# Patient Record
Sex: Female | Born: 1950 | Race: White | Hispanic: No | Marital: Married | Smoking: Former smoker
Health system: Southern US, Community
[De-identification: ages and names within clinical notes are randomized; demographics above are authoritative.]

## PROBLEM LIST (undated history)

## (undated) ENCOUNTER — Emergency Department (HOSPITAL_COMMUNITY): Payer: PPO | Source: Home / Self Care

## (undated) DIAGNOSIS — R51 Headache: Secondary | ICD-10-CM

## (undated) DIAGNOSIS — R569 Unspecified convulsions: Secondary | ICD-10-CM

## (undated) DIAGNOSIS — I519 Heart disease, unspecified: Secondary | ICD-10-CM

## (undated) DIAGNOSIS — Z923 Personal history of irradiation: Secondary | ICD-10-CM

## (undated) DIAGNOSIS — I10 Essential (primary) hypertension: Secondary | ICD-10-CM

## (undated) DIAGNOSIS — I639 Cerebral infarction, unspecified: Secondary | ICD-10-CM

## (undated) DIAGNOSIS — D329 Benign neoplasm of meninges, unspecified: Secondary | ICD-10-CM

## (undated) DIAGNOSIS — I219 Acute myocardial infarction, unspecified: Secondary | ICD-10-CM

## (undated) DIAGNOSIS — C50512 Malignant neoplasm of lower-outer quadrant of left female breast: Principal | ICD-10-CM

## (undated) DIAGNOSIS — F419 Anxiety disorder, unspecified: Secondary | ICD-10-CM

## (undated) DIAGNOSIS — M1990 Unspecified osteoarthritis, unspecified site: Secondary | ICD-10-CM

## (undated) DIAGNOSIS — G8310 Monoplegia of lower limb affecting unspecified side: Secondary | ICD-10-CM

## (undated) DIAGNOSIS — E785 Hyperlipidemia, unspecified: Secondary | ICD-10-CM

## (undated) DIAGNOSIS — IMO0002 Reserved for concepts with insufficient information to code with codable children: Secondary | ICD-10-CM

## (undated) HISTORY — DX: Malignant neoplasm of lower-outer quadrant of left female breast: C50.512

## (undated) HISTORY — DX: Heart disease, unspecified: I51.9

## (undated) HISTORY — DX: Anxiety disorder, unspecified: F41.9

## (undated) HISTORY — DX: Unspecified convulsions: R56.9

---

## 1985-08-17 DIAGNOSIS — I219 Acute myocardial infarction, unspecified: Secondary | ICD-10-CM

## 1985-08-17 HISTORY — DX: Acute myocardial infarction, unspecified: I21.9

## 1985-08-17 HISTORY — PX: CORONARY ANGIOPLASTY: SHX604

## 1985-08-17 HISTORY — PX: CARDIAC CATHETERIZATION: SHX172

## 1997-05-17 HISTORY — PX: BRAIN MENINGIOMA EXCISION: SHX576

## 1997-08-17 DIAGNOSIS — G8310 Monoplegia of lower limb affecting unspecified side: Secondary | ICD-10-CM

## 1997-08-17 DIAGNOSIS — D329 Benign neoplasm of meninges, unspecified: Secondary | ICD-10-CM

## 1997-08-17 HISTORY — DX: Monoplegia of lower limb affecting unspecified side: G83.10

## 1997-08-17 HISTORY — DX: Benign neoplasm of meninges, unspecified: D32.9

## 1997-11-15 ENCOUNTER — Encounter: Admission: RE | Admit: 1997-11-15 | Discharge: 1998-02-13 | Payer: Self-pay | Admitting: Radiation Oncology

## 1997-11-26 ENCOUNTER — Ambulatory Visit (HOSPITAL_COMMUNITY): Admission: RE | Admit: 1997-11-26 | Discharge: 1997-11-26 | Payer: Self-pay | Admitting: Neurosurgery

## 1999-04-21 ENCOUNTER — Ambulatory Visit (HOSPITAL_COMMUNITY): Admission: RE | Admit: 1999-04-21 | Discharge: 1999-04-21 | Payer: Self-pay | Admitting: Neurosurgery

## 1999-04-21 ENCOUNTER — Encounter: Payer: Self-pay | Admitting: Neurosurgery

## 2000-04-14 ENCOUNTER — Encounter: Payer: Self-pay | Admitting: Neurosurgery

## 2000-04-14 ENCOUNTER — Ambulatory Visit (HOSPITAL_COMMUNITY): Admission: RE | Admit: 2000-04-14 | Discharge: 2000-04-14 | Payer: Self-pay | Admitting: Neurosurgery

## 2000-08-26 ENCOUNTER — Other Ambulatory Visit: Admission: RE | Admit: 2000-08-26 | Discharge: 2000-08-26 | Payer: Self-pay | Admitting: Gynecology

## 2000-12-09 ENCOUNTER — Other Ambulatory Visit: Admission: RE | Admit: 2000-12-09 | Discharge: 2000-12-09 | Payer: Self-pay | Admitting: Gynecology

## 2002-04-10 ENCOUNTER — Encounter: Payer: Self-pay | Admitting: Neurosurgery

## 2002-04-10 ENCOUNTER — Ambulatory Visit (HOSPITAL_COMMUNITY): Admission: RE | Admit: 2002-04-10 | Discharge: 2002-04-10 | Payer: Self-pay | Admitting: Neurosurgery

## 2004-05-13 ENCOUNTER — Other Ambulatory Visit: Admission: RE | Admit: 2004-05-13 | Discharge: 2004-05-13 | Payer: Self-pay | Admitting: Gynecology

## 2004-07-03 ENCOUNTER — Ambulatory Visit: Payer: Self-pay | Admitting: Family Medicine

## 2005-04-24 ENCOUNTER — Ambulatory Visit (HOSPITAL_COMMUNITY): Admission: RE | Admit: 2005-04-24 | Discharge: 2005-04-24 | Payer: Self-pay | Admitting: Neurosurgery

## 2005-06-22 ENCOUNTER — Encounter: Admission: RE | Admit: 2005-06-22 | Discharge: 2005-09-20 | Payer: Self-pay | Admitting: Neurosurgery

## 2005-07-06 ENCOUNTER — Ambulatory Visit: Payer: Self-pay | Admitting: Family Medicine

## 2005-10-01 ENCOUNTER — Ambulatory Visit: Payer: Self-pay | Admitting: Family Medicine

## 2005-10-22 ENCOUNTER — Ambulatory Visit: Payer: Self-pay | Admitting: Family Medicine

## 2005-11-24 ENCOUNTER — Ambulatory Visit: Payer: Self-pay | Admitting: Family Medicine

## 2006-02-19 ENCOUNTER — Ambulatory Visit: Payer: Self-pay | Admitting: Family Medicine

## 2006-03-01 ENCOUNTER — Encounter: Payer: Self-pay | Admitting: Family Medicine

## 2006-03-01 ENCOUNTER — Other Ambulatory Visit: Admission: RE | Admit: 2006-03-01 | Discharge: 2006-03-01 | Payer: Self-pay | Admitting: Family Medicine

## 2006-03-01 ENCOUNTER — Ambulatory Visit: Payer: Self-pay | Admitting: Family Medicine

## 2006-03-24 ENCOUNTER — Ambulatory Visit (HOSPITAL_COMMUNITY): Admission: RE | Admit: 2006-03-24 | Discharge: 2006-03-24 | Payer: Self-pay | Admitting: Neurosurgery

## 2007-02-24 ENCOUNTER — Ambulatory Visit: Payer: Self-pay | Admitting: Family Medicine

## 2007-02-24 LAB — CONVERTED CEMR LAB
Albumin: 4.5 g/dL (ref 3.5–5.2)
Alkaline Phosphatase: 74 units/L (ref 39–117)
BUN: 7 mg/dL (ref 6–23)
Basophils Absolute: 0 10*3/uL (ref 0.0–0.1)
Cholesterol: 240 mg/dL (ref 0–200)
Direct LDL: 137.5 mg/dL
Eosinophils Absolute: 0.2 10*3/uL (ref 0.0–0.6)
GFR calc Af Amer: 133 mL/min
HCT: 43.2 % (ref 36.0–46.0)
Hemoglobin: 14.9 g/dL (ref 12.0–15.0)
Lymphocytes Relative: 40.3 % (ref 12.0–46.0)
MCHC: 34.4 g/dL (ref 30.0–36.0)
MCV: 91.3 fL (ref 78.0–100.0)
Monocytes Absolute: 0.5 10*3/uL (ref 0.2–0.7)
Monocytes Relative: 9.7 % (ref 3.0–11.0)
Neutro Abs: 2.5 10*3/uL (ref 1.4–7.7)
Neutrophils Relative %: 46.6 % (ref 43.0–77.0)
Potassium: 3.2 meq/L — ABNORMAL LOW (ref 3.5–5.1)
Sodium: 135 meq/L (ref 135–145)
TSH: 1.35 microintl units/mL (ref 0.35–5.50)
Total Bilirubin: 0.7 mg/dL (ref 0.3–1.2)
Total CHOL/HDL Ratio: 2.9

## 2007-03-04 ENCOUNTER — Other Ambulatory Visit: Admission: RE | Admit: 2007-03-04 | Discharge: 2007-03-04 | Payer: Self-pay | Admitting: Family Medicine

## 2007-03-04 ENCOUNTER — Encounter: Payer: Self-pay | Admitting: Family Medicine

## 2007-03-04 ENCOUNTER — Ambulatory Visit: Payer: Self-pay | Admitting: Family Medicine

## 2007-05-12 DIAGNOSIS — I4949 Other premature depolarization: Secondary | ICD-10-CM | POA: Insufficient documentation

## 2007-05-12 DIAGNOSIS — E785 Hyperlipidemia, unspecified: Secondary | ICD-10-CM | POA: Insufficient documentation

## 2007-05-12 DIAGNOSIS — I10 Essential (primary) hypertension: Secondary | ICD-10-CM | POA: Insufficient documentation

## 2007-05-20 DIAGNOSIS — S8490XA Injury of unspecified nerve at lower leg level, unspecified leg, initial encounter: Secondary | ICD-10-CM | POA: Insufficient documentation

## 2007-07-27 ENCOUNTER — Encounter: Payer: Self-pay | Admitting: Family Medicine

## 2007-08-24 ENCOUNTER — Encounter: Payer: Self-pay | Admitting: Family Medicine

## 2008-01-19 ENCOUNTER — Telehealth: Payer: Self-pay | Admitting: Family Medicine

## 2008-01-20 ENCOUNTER — Telehealth: Payer: Self-pay | Admitting: *Deleted

## 2008-01-20 ENCOUNTER — Encounter: Payer: Self-pay | Admitting: Family Medicine

## 2008-01-31 ENCOUNTER — Telehealth: Payer: Self-pay | Admitting: Family Medicine

## 2008-02-03 ENCOUNTER — Telehealth: Payer: Self-pay | Admitting: Family Medicine

## 2008-02-08 ENCOUNTER — Telehealth (INDEPENDENT_AMBULATORY_CARE_PROVIDER_SITE_OTHER): Payer: Self-pay | Admitting: *Deleted

## 2008-02-09 ENCOUNTER — Telehealth: Payer: Self-pay | Admitting: Family Medicine

## 2008-02-10 ENCOUNTER — Telehealth: Payer: Self-pay | Admitting: Family Medicine

## 2008-02-13 ENCOUNTER — Telehealth (INDEPENDENT_AMBULATORY_CARE_PROVIDER_SITE_OTHER): Payer: Self-pay | Admitting: *Deleted

## 2008-02-21 ENCOUNTER — Encounter: Payer: Self-pay | Admitting: Family Medicine

## 2008-03-07 ENCOUNTER — Telehealth: Payer: Self-pay | Admitting: *Deleted

## 2008-03-16 ENCOUNTER — Ambulatory Visit: Payer: Self-pay | Admitting: Family Medicine

## 2008-03-16 LAB — CONVERTED CEMR LAB
AST: 24 units/L (ref 0–37)
Albumin: 4.3 g/dL (ref 3.5–5.2)
BUN: 7 mg/dL (ref 6–23)
Basophils Absolute: 0 10*3/uL (ref 0.0–0.1)
Basophils Relative: 0.2 % (ref 0.0–3.0)
Blood in Urine, dipstick: NEGATIVE
Calcium: 9.4 mg/dL (ref 8.4–10.5)
Carbamazepine Lvl: 5.9 ug/mL (ref 4.0–12.0)
Creatinine, Ser: 0.7 mg/dL (ref 0.4–1.2)
Direct LDL: 136.5 mg/dL
Eosinophils Absolute: 0.2 10*3/uL (ref 0.0–0.7)
Eosinophils Relative: 3.1 % (ref 0.0–5.0)
GFR calc Af Amer: 111 mL/min
GFR calc non Af Amer: 92 mL/min
Glucose, Urine, Semiquant: NEGATIVE
HCT: 39.9 % (ref 36.0–46.0)
HDL: 61.2 mg/dL (ref >39.0)
Ketones, urine, test strip: NEGATIVE
MCHC: 34.7 g/dL (ref 30.0–36.0)
MCV: 92.6 fL (ref 78.0–100.0)
Monocytes Absolute: 0.6 10*3/uL (ref 0.1–1.0)
Nitrite: NEGATIVE
RBC: 4.31 M/uL (ref 3.87–5.11)
Specific Gravity, Urine: 1.02
TSH: 2.15 microintl units/mL (ref 0.35–5.50)
Total Bilirubin: 0.8 mg/dL (ref 0.3–1.2)
Triglycerides: 131 mg/dL (ref 0–149)
WBC: 5.6 10*3/uL (ref 4.5–10.5)
pH: 7

## 2008-03-20 ENCOUNTER — Encounter
Admission: RE | Admit: 2008-03-20 | Discharge: 2008-04-18 | Payer: Self-pay | Admitting: Physical Medicine & Rehabilitation

## 2008-03-21 ENCOUNTER — Ambulatory Visit: Payer: Self-pay | Admitting: Physical Medicine & Rehabilitation

## 2008-03-23 ENCOUNTER — Encounter: Payer: Self-pay | Admitting: Family Medicine

## 2008-03-23 ENCOUNTER — Other Ambulatory Visit: Admission: RE | Admit: 2008-03-23 | Discharge: 2008-03-23 | Payer: Self-pay | Admitting: Family Medicine

## 2008-03-23 ENCOUNTER — Ambulatory Visit: Payer: Self-pay | Admitting: Family Medicine

## 2008-04-11 ENCOUNTER — Encounter
Admission: RE | Admit: 2008-04-11 | Discharge: 2008-07-10 | Payer: Self-pay | Admitting: Physical Medicine & Rehabilitation

## 2008-04-18 ENCOUNTER — Ambulatory Visit: Payer: Self-pay | Admitting: Physical Medicine & Rehabilitation

## 2008-04-27 ENCOUNTER — Encounter
Admission: RE | Admit: 2008-04-27 | Discharge: 2008-04-27 | Payer: Self-pay | Admitting: Physical Medicine & Rehabilitation

## 2008-05-17 ENCOUNTER — Encounter
Admission: RE | Admit: 2008-05-17 | Discharge: 2008-07-26 | Payer: Self-pay | Admitting: Physical Medicine & Rehabilitation

## 2008-05-18 ENCOUNTER — Ambulatory Visit: Payer: Self-pay | Admitting: Physical Medicine & Rehabilitation

## 2008-05-24 ENCOUNTER — Ambulatory Visit: Payer: Self-pay | Admitting: Family Medicine

## 2008-07-17 ENCOUNTER — Encounter
Admission: RE | Admit: 2008-07-17 | Discharge: 2008-07-26 | Payer: Self-pay | Admitting: Physical Medicine & Rehabilitation

## 2008-09-19 ENCOUNTER — Emergency Department (HOSPITAL_COMMUNITY): Admission: EM | Admit: 2008-09-19 | Discharge: 2008-09-19 | Payer: Self-pay | Admitting: Emergency Medicine

## 2008-10-08 ENCOUNTER — Encounter: Payer: Self-pay | Admitting: Family Medicine

## 2008-12-04 ENCOUNTER — Telehealth: Payer: Self-pay | Admitting: Family Medicine

## 2009-01-02 ENCOUNTER — Telehealth: Payer: Self-pay | Admitting: Family Medicine

## 2009-03-21 ENCOUNTER — Ambulatory Visit: Payer: Self-pay | Admitting: Family Medicine

## 2009-03-21 LAB — CONVERTED CEMR LAB
ALT: 20 units/L (ref 0–35)
BUN: 9 mg/dL (ref 6–23)
Basophils Relative: 0.4 % (ref 0.0–3.0)
CO2: 27 meq/L (ref 19–32)
Chloride: 95 meq/L — ABNORMAL LOW (ref 96–112)
Direct LDL: 132.2 mg/dL
Eosinophils Relative: 1.7 % (ref 0.0–5.0)
Glucose, Urine, Semiquant: NEGATIVE
HCT: 39.5 % (ref 36.0–46.0)
Lymphs Abs: 2.6 10*3/uL (ref 0.7–4.0)
MCV: 89.7 fL (ref 78.0–100.0)
Monocytes Absolute: 0.6 10*3/uL (ref 0.1–1.0)
Platelets: 261 10*3/uL (ref 150.0–400.0)
Potassium: 3.4 meq/L — ABNORMAL LOW (ref 3.5–5.1)
Protein, U semiquant: NEGATIVE
RBC: 4.41 M/uL (ref 3.87–5.11)
TSH: 1.64 microintl units/mL (ref 0.35–5.50)
Total Protein: 7.3 g/dL (ref 6.0–8.3)
VLDL: 25.8 mg/dL (ref 0.0–40.0)
WBC: 6.3 10*3/uL (ref 4.5–10.5)
pH: 7.5

## 2009-03-28 ENCOUNTER — Other Ambulatory Visit: Admission: RE | Admit: 2009-03-28 | Discharge: 2009-03-28 | Payer: Self-pay | Admitting: Family Medicine

## 2009-03-28 ENCOUNTER — Encounter: Payer: Self-pay | Admitting: Family Medicine

## 2009-03-28 ENCOUNTER — Ambulatory Visit: Payer: Self-pay | Admitting: Family Medicine

## 2009-05-22 ENCOUNTER — Ambulatory Visit: Payer: Self-pay | Admitting: Family Medicine

## 2009-10-22 ENCOUNTER — Encounter: Payer: Self-pay | Admitting: *Deleted

## 2009-12-16 ENCOUNTER — Telehealth: Payer: Self-pay | Admitting: Family Medicine

## 2010-03-24 ENCOUNTER — Ambulatory Visit: Payer: Self-pay | Admitting: Family Medicine

## 2010-03-24 LAB — CONVERTED CEMR LAB
ALT: 27 units/L (ref 0–35)
AST: 23 units/L (ref 0–37)
Albumin: 4.7 g/dL (ref 3.5–5.2)
Basophils Absolute: 0 10*3/uL (ref 0.0–0.1)
Bilirubin Urine: NEGATIVE
CO2: 29 meq/L (ref 19–32)
GFR calc non Af Amer: 122.86 mL/min (ref >60)
Glucose, Bld: 82 mg/dL (ref 70–99)
HCT: 42.2 % (ref 36.0–46.0)
Hemoglobin: 14.5 g/dL (ref 12.0–15.0)
Lymphs Abs: 2.7 10*3/uL (ref 0.7–4.0)
Monocytes Relative: 8.9 % (ref 3.0–12.0)
Neutro Abs: 3.4 10*3/uL (ref 1.4–7.7)
Potassium: 3.6 meq/L (ref 3.5–5.1)
RDW: 13.4 % (ref 11.5–14.6)
Sodium: 133 meq/L — ABNORMAL LOW (ref 135–145)
TSH: 1.73 microintl units/mL (ref 0.35–5.50)
Total Protein: 7.1 g/dL (ref 6.0–8.3)
Urobilinogen, UA: 0.2
pH: 8.5

## 2010-03-31 ENCOUNTER — Ambulatory Visit: Payer: Self-pay | Admitting: Family Medicine

## 2010-03-31 ENCOUNTER — Other Ambulatory Visit: Admission: RE | Admit: 2010-03-31 | Discharge: 2010-03-31 | Payer: Self-pay | Admitting: Family Medicine

## 2010-03-31 DIAGNOSIS — D332 Benign neoplasm of brain, unspecified: Secondary | ICD-10-CM | POA: Insufficient documentation

## 2010-04-14 ENCOUNTER — Telehealth: Payer: Self-pay | Admitting: Family Medicine

## 2010-06-09 ENCOUNTER — Ambulatory Visit: Payer: Self-pay | Admitting: Family Medicine

## 2010-09-07 ENCOUNTER — Encounter: Payer: Self-pay | Admitting: Neurosurgery

## 2010-09-14 LAB — CONVERTED CEMR LAB: Pap Smear: NEGATIVE

## 2010-09-16 NOTE — Progress Notes (Signed)
Summary: refill  Phone Note Refill Request Message from:  Fax from Pharmacy on April 14, 2010 5:11 PM  Refills Requested: Medication #1:  ACCUPRIL 40 MG  TABS 2 every morning Initial call taken by: Kern Reap CMA Duncan Dull),  April 14, 2010 5:16 PM    Prescriptions: ACCUPRIL 40 MG  TABS (QUINAPRIL HCL) 2 every morning  #200 x 3   Entered by:   Kern Reap CMA (AAMA)   Authorized by:   Roderick Pee MD   Signed by:   Kern Reap CMA (AAMA) on 04/14/2010   Method used:   Electronically to        [facility] [address] (retail)       [address]       [place], [place]  [address]       Ph: [phone]       Fax: [phone]   RxID:   [id number]

## 2010-09-16 NOTE — Miscellaneous (Signed)
Summary: mammogram  Clinical Lists Changes  Observations: Added new observation of MAMMOGRAM: normal (10/18/2009 13:44)      Preventive Care Screening  Mammogram:    Date:  10/18/2009    Results:  normal

## 2010-09-16 NOTE — Assessment & Plan Note (Signed)
Summary: flu shot pt will be here around 145p/njr  Nurse Visit   Review of Systems       Flu Vaccine Consent Questions     Do you have a history of severe allergic reactions to this vaccine? no    Any prior history of allergic reactions to egg and/or gelatin? no    Do you have a sensitivity to the preservative Thimersol? no    Do you have a past history of Guillan-Barre Syndrome? no    Do you currently have an acute febrile illness? no    Have you ever had a severe reaction to latex? no    Vaccine information given and explained to patient? yes    Are you currently pregnant? no    Lot Number:[id number]   Exp Date:02/14/2011   Site Given  Left Deltoid IM    Allergies: No Known Drug Allergies  Orders Added: 1)  Admin 1st Vaccine [90471] 2)  Flu Vaccine 51yrs + [[address]]

## 2010-09-16 NOTE — Assessment & Plan Note (Signed)
Summary: CPX // RS   Vital Signs:  Patient profile:   60 year old female Menstrual status:  postmenopausal Height:      63 inches Weight:      140 pounds BMI:     24.89 BP sitting:   170 / 110  (left arm) Cuff size:   regular  Vitals Entered By: Kern Reap CMA Duncan Dull) (March 31, 2010 2:49 PM) CC: cpx   CC:  cpx.  History of Present Illness: Stacy Diaz is a 60 year old, married female, status post CNS tumor removal, and has almost 100%. Right and left lower extremity paralysis, who comes in today for annual physical exam. This was reviewed in detail, and there are no changes   She continues to be nonambulatory.  She is able to get around with electric scooter.  She has braces on both lower extremities to prevent contractures.  She states although she is unhappy being confined to wheelchair other than that her life is good and she is happy to be alive  Allergies: No Known Drug Allergies  Past History:  Past medical, surgical, family and social histories (including risk factors) reviewed, and no changes noted (except as noted below).  Past Medical History: Reviewed history from 05/20/2007 and no changes required. 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 meningioma  1998 MI  Family History: Reviewed history and no changes required.  Social History: Reviewed history from 03/23/2008 and no changes required. Retired Married Never Smoked Alcohol use-no Drug use-no Regular exercise-no  Review of Systems      See HPI  Physical Exam  General:  Well-developed,well-nourished,in no acute distress; alert,appropriate and cooperative throughout examination Head:  Normocephalic and atraumatic without obvious abnormalities. No apparent alopecia or balding. Eyes:  No corneal or conjunctival inflammation noted. EOMI. Perrla. Funduscopic exam benign,  without hemorrhages, exudates or papilledema. Vision grossly normal. Ears:  External ear exam shows no significant lesions or deformities.  Otoscopic examination reveals clear canals, tympanic membranes are intact bilaterally without bulging, retraction, inflammation or discharge. Hearing is grossly normal bilaterally. Nose:  External nasal examination shows no deformity or inflammation. Nasal mucosa are pink and moist without lesions or exudates. Mouth:  Oral mucosa and oropharynx without lesions or exudates.  Teeth in good repair. Neck:  No deformities, masses, or tenderness noted. Chest Wall:  No deformities, masses, or tenderness noted. Breasts:  No mass, nodules, thickening, tenderness, bulging, retraction, inflamation, nipple discharge or skin changes noted.   Lungs:  Normal respiratory effort, chest expands symmetrically. Lungs are clear to auscultation, no crackles or wheezes. Heart:  Normal rate and regular rhythm. S1 and S2 normal without gallop, murmur, click, rub or other extra sounds. Abdomen:  Bowel sounds positive,abdomen soft and non-tender without masses, organomegaly or hernias noted. Genitalia:  Pelvic Exam:        External: normal female genitalia without lesions or masses        Vagina: normal without lesions or masses        Cervix: normal without lesions or masses        Adnexa: normal bimanual exam without masses or fullness        Uterus: normal by palpation        Pap smear: performed Msk:  braces on both lower extremities to prevent extension and contractures, total paralysis, and Pulses:  R and L carotid,radial,femoral,dorsalis pedis and posterior tibial pulses are full and  equal bilaterally Neurologic:  normal except for paralysis below the waist Skin:  Intact without suspicious lesions or rashes Cervical Nodes:  No lymphadenopathy noted Axillary Nodes:  No palpable lymphadenopathy Inguinal Nodes:  No significant adenopathy Psych:  Cognition and judgment appear  intact. Alert and cooperative with normal attention span and concentration. No apparent delusions, illusions, hallucinations   Problems:  Medical Problems Added: 1)  Dx of Benign Neoplasm of Brain  (ICD-225.0) 2)  Dx of Routine General Medical Exam@health  Care Facl  (ICD-V70.0)  Impression & Recommendations:  Problem # 1:  ROUTINE GENERAL MEDICAL EXAM@HEALTH  CARE FACL (ICD-V70.0) Assessment Unchanged  Orders: Prescription Created Electronically [phone]) EKG w/ Interpretation ([address])  Problem # 2:  HYPERTENSION (ICD-401.9) Assessment: Improved  Her updated medication list for this problem includes:    Accupril 40 Mg Tabs (Quinapril hcl) .Marland Kitchen... 2 every morning    Tenoretic 50 50-25 Mg Tabs (Atenolol-chlorthalidone) .Marland Kitchen... Take 1 tablet by mouth every morning  Orders: Prescription Created Electronically [phone]) EKG w/ Interpretation ([address])  Problem # 3:  HYPERLIPIDEMIA (ICD-272.4) Assessment: Deteriorated  Her updated medication list for this problem includes:    Zocor 20 Mg Tabs (Simvastatin) .Marland Kitchen... 1 tab @ bedtime  Orders: Prescription Created Electronically [id number]) EKG w/ Interpretation ([address])  Problem # 4:  BENIGN NEOPLASM OF BRAIN (ICD-225.0) Assessment: Unchanged  Complete Medication List: 1)  Tegretol 200 Mg Tabs (Carbamazepine) .... Take 1 tablet by mouth three times a day 2)  Zocor 20 Mg Tabs (Simvastatin) .Marland Kitchen.. 1 tab @ bedtime 3)  Ibuprofen 200 Mg Caps (Ibuprofen) .... 3 two times a day 4)  Accupril 40 Mg Tabs (Quinapril hcl) .... 2 every morning 5)  Aspirin 325 Mg Tbec (Aspirin) .... Once daily 6)  Vicodin Es 7.5-750 Mg Tabs (Hydrocodone-acetaminophen) .... Take 1 tablet by mouth every 4 hrs as needed for pain 7)  Tenoretic 50 50-25 Mg Tabs (Atenolol-chlorthalidone) .... Take 1 tablet by mouth every morning 8)  Klor-con M20 20 Meq Cr-tabs (Potassium chloride crys cr) .... Take 1 tablet by mouth every morning 9)  Baclofen 20 Mg Tabs (Baclofen) .... Take one tab  four times a day  Patient Instructions: 1)  continue your current medications except increase the Zocor to 20 mg nightly. 2)  Please schedule a follow-up appointment in 1 year. Prescriptions: VICODIN ES 7.5-750 MG  TABS (HYDROCODONE-ACETAMINOPHEN) Take 1 tablet by mouth every 4 hrs as needed for pain  #200 x 6   Entered and Authorized by:   Roderick Pee MD   Signed by:   Roderick Pee MD on 03/31/2010   Method used:   Print then Give to Patient   RxID:   [id number] BACLOFEN 20 MG TABS (BACLOFEN) take one tab four times a day  #400 x 3   Entered and Authorized by:   Roderick Pee MD   Signed by:   Roderick Pee MD on 03/31/2010   Method used:   Electronically to        [facility] [address] (retail)       [address]       [place], [place]  [address]       Ph: [phone]       Fax: [phone]   RxID:   [id number] KLOR-CON M20 20 MEQ  CR-TABS (POTASSIUM CHLORIDE CRYS CR) Take 1 tablet by mouth every morning  #100 x 3   Entered and Authorized by:  Roderick Pee MD   Signed by:   Roderick Pee MD on 03/31/2010   Method used:   Electronically to        [facility] [address] (retail)       [address]       [place], [place]  [address]       Ph: [phone]       Fax: [phone]   RxID:   [id number] TENORETIC 50 50-25 MG  TABS (ATENOLOL-CHLORTHALIDONE) Take 1 tablet by mouth every morning  #100 x 3   Entered and Authorized by:   Roderick Pee MD   Signed by:   Roderick Pee MD on 03/31/2010   Method used:   Electronically to        [facility] (retail)       [address]       [place], [place]  [address]       Ph: [phone]       Fax: [phone]   RxID:   [id number] ACCUPRIL 40 MG  TABS (QUINAPRIL HCL) 2 every morning  #200 x 3   Entered and Authorized by:   Roderick Pee MD   Signed by:   Roderick Pee MD on  03/31/2010   Method used:   Electronically to        [facility] (retail)       [address]       [place], [place]  [address]       Ph: [phone]       Fax: [phone]   RxID:   [id number] TEGRETOL 200 MG  TABS (CARBAMAZEPINE) Take 1 tablet by mouth three times a day  #300 x 3   Entered and Authorized by:   Roderick Pee MD   Signed by:   Roderick Pee MD on 03/31/2010   Method used:   Electronically to        [facility] (retail)       [address]       [place], [place]  [address]       Ph: [phone]       Fax: [phone]   RxID:   [id number] ZOCOR 20 MG  TABS (SIMVASTATIN) 1 tab @ bedtime  #100 x 3   Entered and Authorized by:   Roderick Pee MD   Signed by:   Roderick Pee MD on 03/31/2010   Method used:   Electronically to        [facility] (retail)       [address]       [place], [place]  [address]       Ph: [phone]       Fax: [phone]   RxID:   [id number]

## 2010-09-16 NOTE — Progress Notes (Signed)
Summary: hydrocodone refill  Phone Note From Pharmacy   Summary of Call: patient is requesting a refill of hydrocodone is this okay to fill? Initial call taken by: Kern Reap CMA Duncan Dull),  Dec 16, 2009 11:43 AM  Follow-up for Phone Call        okay x 6 months Follow-up by: Roderick Pee MD,  Dec 16, 2009 11:53 AM  Additional Follow-up for Phone Call Additional follow up Details #1::        faxed to pharmacy Additional Follow-up by: Kern Reap CMA Duncan Dull),  Dec 16, 2009 3:51 PM

## 2010-12-30 NOTE — Assessment & Plan Note (Signed)
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  We got  to start her on physical therapy, which really just began over the last  few days.  They are working on transfers, tone control, etc.  We will  also have Orthotics come on to see her regarding bracing adjustment.  She does not know there is a huge difference with the Baclofen at this  point.  She is still having a lot of fluctuating spasms.  She felt that  trigger point injection helped her a couple of days, but then the  effects wore off.  She notes that the Lidoderm patch has helped at night  for pain and has helped her at rest.  She rates her pain 3-7/10.  Overall, she describes it as dull and it had been aching.  She has pain  in her knees when they hyperextend in standing and transferring.   Pain seems to improve somewhat with rest and some of her analgesics.  She does use Vicodin mainly for pain control.   REVIEW OF SYSTEMS:  Notable for numbness, trouble walking, and night  sweats.  Other pertinent positives are listed above, and full review is  in the written health and history section of the chart.   SOCIAL HISTORY:  The patient is married, living with her husband.  Husband remains very supportive.   PHYSICAL EXAMINATION:  VITAL SIGNS:  Blood pressure is 160/83, pulse is  67, respiratory rate 18, and she is sating 99% on room air.  GENERAL:  The patient is pleasant, alert, and oriented x3.  She is in a  wheelchair today.  The patient has similar tone compared to her last  visit with 1-2/4 tone at the knees and hips and 3/4 tone at the ankles.  She has clonus bilaterally.  Reflexes are hyperactive.  Sensory exam is  really unremarkable for the most part.  She has good shoulder and neck  range of motion.  She has normal motor and sensory findings in the upper  extremities today.  Trapezius areas are tender.  Cognitively, she is  appropriate.  She remains overweight.  HEART:  Regular.  CHEST:  Clear.  ABDOMEN:  Soft and nontender.   ASSESSMENT:  1. History of meningioma.  2. Questionable thoracic spinal cord injury.  I wonder if she had some      type of spinal cord infarct.  3. Trapezius/shoulder girdle myofascial pain.  4. History of myocardial infarction.   PLAN:  1. Continue physical therapy.  We will have Orthotics see her for      brace adjustment.  I think she needs to look at a non-hinged type      of brace for better control of her knee, perhaps with heel      elevation.  2. We will titrate Baclofen up to 20 mg t.i.d. over the next week's      time.  The patient has had no problems thus far with the 10-mg      dosing.  Consider Botox.  3. Wrote prescription for Lidoderm patches for local relief.  She can      use Vicodin for breakthrough pain as well.  4. I will see her back in about a month's time.  I think she has a lot      of room for progress.  We spent an extensive amount of time      reviewing her case, symptoms, and prognosis today.  I would like to  order a CT myelogram of her thoracic and lumbar spine to assess for      stenosis.  Unfortunately, because of the hardware in her brain, she      is not a candidate for an MRI.  Husband and the patient note      overall decline over the last few years in her      motor function.  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  I think this has not been completely followed      up or worked up as of this point, and I would like to do so by      ordering the CT myelogram.      Ranelle Oyster, M.D.  Electronically Signed     ZTS/MedQ  D:  04/18/2008 11:31:25  T:  04/19/2008 04:19:38  Job #:  [id number]   cc:   Tinnie Gens A. Tawanna Cooler, MD  [address] [place]  [place] [address]

## 2010-12-30 NOTE — Assessment & Plan Note (Signed)
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  She  had been involved with therapy.  She has not changed a lot functionally.  Her knees still tend to go into recurvatum when she tries to stand or  bear weight at all.  She ran into a snag as far as coverage of her  potential new orthotics as well, and we will need to go to another  vendor.  She feels that the back when this helps spasticity somewhat,  but husband feels that it may have impaired and negatively impacted her  transverse by decreasing some of her resting tone, which apparently she  relied on somewhat for helping him move her.  Sleep is fair.  She denies  pain except at the knees and when her legs catch.  They tend to go into  various spasms at the knees when transferring, etc.  We reviewed her CT  scan over the phone a few weeks ago and no significant pathology was  identified that could account for myelopathy of the thoracic or lumbar  cord.   REVIEW OF SYSTEMS:  Notable for the above.  Full review is in the  written health and history section in the chart.   SOCIAL HISTORY:  Unchanged.  She is married, living with her husband,  and remains supportive.   PHYSICAL EXAMINATION:  VITAL SIGNS:  Blood pressure is 161/82, pulse is  66, respiratory rate 18, and she is saturating 99% on room air.  Weight  is stable.  GENERAL:  The patient is pleasant, alert, and oriented x3.  HEART:  Regular.  CHEST:  Clear.  ABDOMEN:  Soft and nontender.  MUSCULOSKELETAL:  She continues to have tone which is 1+ to 2 at the  knees; 2/4 at the ankles with clonus noted.  She has active knee  extension at 3/5 and knee flexion is 3 to 3+/5.  She has trace 1 plantar  flexion and no obvious dorsiflexion of either foot.  Sensory findings  are normal in both legs.  Cognitively, she is intact.   ASSESSMENT:  1. History of meningioma.  2. Questionable thoracic spinal cord injury/infarct.  3. Trapezius and shoulder girdle myofascial  pain.  4. History of myocardial infarction.   PLAN:  1. We will continue with physical therapy to work on range of motion      and strengthening.  We will have the patient go to [facility] for knee      braces to prevent recurvatum.  I think we should try this first      before we go into new ankle-foot orthosis.  2. We will increase baclofen to 20 mg q.i.d. for now to see if she can      get benefits again.  I think if she does not make any further      progress with the therapy route, we need look at Botox and trying      to treat her spasticity more aggressively.  I note that she depends      somewhat on the tone to help support her for transferring, but the      flip-side of this is that she does not have consistent active      movements of the limbs and gastrocs are quite tight in particular.  3. I will see the patient back in about a month for followup.      Ranelle Oyster, M.D.  Electronically Signed    ZTS/MedQ  D:  05/18/2008 16:44:16  T:  05/19/2008 04:01:02  Job #:  [id number]

## 2011-01-02 ENCOUNTER — Telehealth: Payer: Self-pay | Admitting: Family Medicine

## 2011-01-15 ENCOUNTER — Other Ambulatory Visit: Payer: Self-pay | Admitting: *Deleted

## 2011-01-15 MED ORDER — HYDROCODONE-ACETAMINOPHEN 7.5-750 MG PO TABS: 1.0000 | ORAL_TABLET | Freq: Four times a day (QID) | ORAL | Status: DC | PRN

## 2011-01-23 ENCOUNTER — Encounter: Payer: Self-pay | Admitting: Family Medicine

## 2011-02-02 ENCOUNTER — Other Ambulatory Visit: Payer: Self-pay | Admitting: Family Medicine

## 2011-02-25 ENCOUNTER — Other Ambulatory Visit: Payer: Self-pay | Admitting: Family Medicine

## 2011-03-10 ENCOUNTER — Other Ambulatory Visit: Payer: Self-pay | Admitting: Family Medicine

## 2011-03-19 ENCOUNTER — Other Ambulatory Visit: Payer: Self-pay | Admitting: *Deleted

## 2011-03-19 MED ORDER — POTASSIUM CHLORIDE ER 10 MEQ PO TBCR: 10.0000 meq | EXTENDED_RELEASE_TABLET | Freq: Three times a day (TID) | ORAL | Status: DC

## 2011-03-26 ENCOUNTER — Other Ambulatory Visit (INDEPENDENT_AMBULATORY_CARE_PROVIDER_SITE_OTHER): Payer: Medicare Other

## 2011-03-26 DIAGNOSIS — T421X5A Adverse effect of iminostilbenes, initial encounter: Secondary | ICD-10-CM

## 2011-03-26 DIAGNOSIS — Z0000 Encounter for general adult medical examination without abnormal findings: Secondary | ICD-10-CM

## 2011-03-26 DIAGNOSIS — T4275XA Adverse effect of unspecified antiepileptic and sedative-hypnotic drugs, initial encounter: Secondary | ICD-10-CM

## 2011-03-26 DIAGNOSIS — Z79899 Other long term (current) drug therapy: Secondary | ICD-10-CM

## 2011-03-26 DIAGNOSIS — E785 Hyperlipidemia, unspecified: Secondary | ICD-10-CM

## 2011-03-26 DIAGNOSIS — I10 Essential (primary) hypertension: Secondary | ICD-10-CM

## 2011-03-26 LAB — CBC WITH DIFFERENTIAL/PLATELET
Basophils Absolute: 0 10*3/uL (ref 0.0–0.1)
Eosinophils Absolute: 0.2 10*3/uL (ref 0.0–0.7)
Lymphocytes Relative: 43.8 % (ref 12.0–46.0)
MCHC: 33.8 g/dL (ref 30.0–36.0)
Monocytes Absolute: 0.7 10*3/uL (ref 0.1–1.0)
Neutrophils Relative %: 40.2 % — ABNORMAL LOW (ref 43.0–77.0)
Platelets: 293 10*3/uL (ref 150.0–400.0)
RDW: 12.8 % (ref 11.5–14.6)

## 2011-03-26 LAB — BASIC METABOLIC PANEL
BUN: 12 mg/dL (ref 6–23)
CO2: 28 mEq/L (ref 19–32)
Calcium: 9.4 mg/dL (ref 8.4–10.5)
Creatinine, Ser: 0.7 mg/dL (ref 0.4–1.2)
Glucose, Bld: 95 mg/dL (ref 70–99)

## 2011-03-26 LAB — POCT URINALYSIS DIPSTICK
Protein, UA: NEGATIVE
Spec Grav, UA: 1.01
Urobilinogen, UA: 0.2
pH, UA: 7

## 2011-03-26 LAB — LIPID PANEL
HDL: 76.3 mg/dL (ref >39.00)
Triglycerides: 108 mg/dL (ref 0.0–149.0)
VLDL: 21.6 mg/dL (ref 0.0–40.0)

## 2011-03-26 LAB — TSH: TSH: 0.93 u[IU]/mL (ref 0.35–5.50)

## 2011-03-26 LAB — HEPATIC FUNCTION PANEL
Alkaline Phosphatase: 60 U/L (ref 39–117)
Bilirubin, Direct: 0 mg/dL (ref 0.0–0.3)

## 2011-03-26 LAB — LDL CHOLESTEROL, DIRECT: Direct LDL: 142.6 mg/dL

## 2011-03-28 LAB — CARBAMAZEPINE, FREE AND TOTAL
Carbamazepine Metabolite -: 1.6 ug/mL (ref 0.2–2.0)
Carbamazepine Metabolite/: 0.7 ug/mL
Carbamazepine, Total: 6.9 ug/mL (ref 4.0–12.0)

## 2011-04-02 ENCOUNTER — Ambulatory Visit (INDEPENDENT_AMBULATORY_CARE_PROVIDER_SITE_OTHER): Payer: Medicare Other | Admitting: Family Medicine

## 2011-04-02 ENCOUNTER — Encounter: Payer: Self-pay | Admitting: Family Medicine

## 2011-04-02 DIAGNOSIS — S8490XA Injury of unspecified nerve at lower leg level, unspecified leg, initial encounter: Secondary | ICD-10-CM

## 2011-04-02 DIAGNOSIS — Z23 Encounter for immunization: Secondary | ICD-10-CM

## 2011-04-02 DIAGNOSIS — E785 Hyperlipidemia, unspecified: Secondary | ICD-10-CM

## 2011-04-02 DIAGNOSIS — I10 Essential (primary) hypertension: Secondary | ICD-10-CM

## 2011-04-02 DIAGNOSIS — Z0000 Encounter for general adult medical examination without abnormal findings: Secondary | ICD-10-CM

## 2011-04-02 DIAGNOSIS — D332 Benign neoplasm of brain, unspecified: Secondary | ICD-10-CM

## 2011-04-02 MED ORDER — ATENOLOL-CHLORTHALIDONE 50-25 MG PO TABS: 1.0000 | ORAL_TABLET | Freq: Every day | ORAL | Status: DC

## 2011-04-02 MED ORDER — SIMVASTATIN 20 MG PO TABS: 20.0000 mg | ORAL_TABLET | Freq: Every day | ORAL | Status: DC

## 2011-04-02 MED ORDER — CARBAMAZEPINE 200 MG PO TABS: 200.0000 mg | ORAL_TABLET | Freq: Three times a day (TID) | ORAL | Status: DC

## 2011-04-02 MED ORDER — AMBULATORY NON FORMULARY MEDICATION: Status: DC

## 2011-04-02 MED ORDER — HYDROCODONE-ACETAMINOPHEN 7.5-750 MG PO TABS: 1.0000 | ORAL_TABLET | Freq: Four times a day (QID) | ORAL | Status: DC | PRN

## 2011-04-02 MED ORDER — QUINAPRIL HCL 40 MG PO TABS: ORAL_TABLET | ORAL | Status: DC

## 2011-04-02 MED ORDER — BACLOFEN 20 MG PO TABS: ORAL_TABLET | ORAL | Status: DC

## 2011-04-02 MED ORDER — POTASSIUM CHLORIDE ER 10 MEQ PO TBCR: 10.0000 meq | EXTENDED_RELEASE_TABLET | Freq: Two times a day (BID) | ORAL | Status: DC

## 2011-04-02 NOTE — Progress Notes (Signed)
  Subjective:    Patient ID: Stacy Diaz, female    DOB: 1950-10-16, 60 y.o.   MRN: [id number]  HPI [name] Is a 60 year old, married female, nonsmoker, who comes in today for evaluation of hypertension, chronic pain, hyperlipidemia, and symptoms related to lower extremity paralysis secondary to a brain tumor.  She takes Vicodin 4 times daily because of chronic pain secondary to the brain tumor and subsequent surgery with the lower extremity paralysis.  She has bilateral orthotics and splints and braces.  She is not able to ambulate without help.  Strength about the waist is fairly normal.  She takes Tenoretic 50 -- 25, Accupril 80 mg and one potassium tablet a day for hypertension.  BP 140/90.  Increase potassium to two a day because potassium low at 3.2.  Because of the chronic pain and spasticity of her lower extremities.  She takes baclofen 20 mg q.i.d.  She also takes an aspirin tablet and Motrin p.r.n.  She also takes Zocor 20 mg nightly for hyperlipidemia, and Tegretol 200 mg 3 times daily.  She gets routine eye care, dental care, recent mammogram normal, colonoscopy, 2010, normal tetanus booster today.   Review of Systems  Constitutional: Negative.   HENT: Negative.   Eyes: Negative.   Respiratory: Negative.   Cardiovascular: Negative.   Gastrointestinal: Negative.   Genitourinary: Negative.   Musculoskeletal: Negative.   Neurological: Negative.   Hematological: Negative.   Psychiatric/Behavioral: Negative.        Objective:   Physical Exam  Constitutional: She appears well-developed and well-nourished.  HENT:  Head: Normocephalic and atraumatic.  Right Ear: External ear normal.  Left Ear: External ear normal.  Nose: Nose normal.  Mouth/Throat: Oropharynx is clear and moist.  Eyes: EOM are normal. Pupils are equal, round, and reactive to light.  Neck: Normal range of motion. Neck supple. No thyromegaly present.  Cardiovascular: Normal rate, regular rhythm, normal  heart sounds and intact distal pulses.  Exam reveals no gallop and no friction rub.   No murmur heard. Pulmonary/Chest: Effort normal and breath sounds normal.  Abdominal: Soft. Bowel sounds are normal. She exhibits no distension and no mass. There is no tenderness. There is no rebound.  Genitourinary:       Bilateral breast exam normal except for retracted.  Left nipple.  This is a chronic issue.  No nipple discharge.  No palpable chest tenderness  Musculoskeletal: Normal range of motion.  Lymphadenopathy:    She has no cervical adenopathy.  Neurological: She is alert. She has normal reflexes. No cranial nerve deficit. She exhibits normal muscle tone. Coordination normal.  Skin: Skin is warm and dry.  Psychiatric: She has a normal mood and affect. Her behavior is normal. Judgment and thought content normal.          Assessment & Plan:  Lower extremity paralysis secondary to brain tumor.  Hypertension.  Continue Tenoretic Accupril increase potassium to two tabs daily.  Hyperlipidemia.  Continue Zocor and aspirin.  Chronic pain continue Vicodin q.i.d.  History of seizure, secondary to CNS surgery.  Continue Tegretol 300 mg 3 times daily.

## 2011-04-02 NOTE — Patient Instructions (Signed)
Continue your current medications.  Returned in one year or sooner if any problems

## 2011-04-08 ENCOUNTER — Telehealth: Payer: Self-pay

## 2011-04-08 NOTE — Telephone Encounter (Signed)
Pharmacy call to verify Rx'es because faxed copies too dark to read. Verbal refills given to pharmacist

## 2011-04-16 ENCOUNTER — Other Ambulatory Visit: Payer: Self-pay | Admitting: Family Medicine

## 2011-05-18 ENCOUNTER — Other Ambulatory Visit (HOSPITAL_COMMUNITY): Payer: Self-pay | Admitting: Neurosurgery

## 2011-05-18 DIAGNOSIS — G8220 Paraplegia, unspecified: Secondary | ICD-10-CM

## 2011-05-28 ENCOUNTER — Ambulatory Visit (HOSPITAL_COMMUNITY)
Admission: RE | Admit: 2011-05-28 | Discharge: 2011-05-28 | Disposition: A | Discharge status: Home / Self Care | Payer: Medicare Other | Source: Ambulatory Visit | Attending: Neurosurgery | Admitting: Neurosurgery

## 2011-05-28 DIAGNOSIS — Z8673 Personal history of transient ischemic attack (TIA), and cerebral infarction without residual deficits: Secondary | ICD-10-CM | POA: Insufficient documentation

## 2011-05-28 DIAGNOSIS — G9389 Other specified disorders of brain: Secondary | ICD-10-CM | POA: Insufficient documentation

## 2011-05-28 DIAGNOSIS — G8220 Paraplegia, unspecified: Secondary | ICD-10-CM

## 2011-05-28 MED ORDER — IOHEXOL 300 MG/ML  SOLN: 80.0000 mL | Freq: Once | INTRAMUSCULAR | Status: AC | PRN

## 2011-06-04 ENCOUNTER — Ambulatory Visit (INDEPENDENT_AMBULATORY_CARE_PROVIDER_SITE_OTHER): Payer: Medicare Other

## 2011-06-04 DIAGNOSIS — Z23 Encounter for immunization: Secondary | ICD-10-CM

## 2011-07-22 ENCOUNTER — Telehealth: Payer: Self-pay

## 2011-07-22 DIAGNOSIS — S8490XA Injury of unspecified nerve at lower leg level, unspecified leg, initial encounter: Secondary | ICD-10-CM

## 2011-07-22 NOTE — Telephone Encounter (Signed)
Pt would like rx for vicodin faxed to [facility].

## 2011-07-23 NOTE — Telephone Encounter (Signed)
ok 

## 2011-07-24 MED ORDER — HYDROCODONE-ACETAMINOPHEN 7.5-750 MG PO TABS: 1.0000 | ORAL_TABLET | Freq: Four times a day (QID) | ORAL | Status: DC | PRN

## 2011-07-24 NOTE — Telephone Encounter (Signed)
rx faxed

## 2011-09-28 ENCOUNTER — Ambulatory Visit (INDEPENDENT_AMBULATORY_CARE_PROVIDER_SITE_OTHER): Payer: Medicare Other | Admitting: Family Medicine

## 2011-09-28 ENCOUNTER — Encounter: Payer: Self-pay | Admitting: Family Medicine

## 2011-09-28 VITALS — BP 140/90

## 2011-09-28 DIAGNOSIS — L0232 Furuncle of buttock: Secondary | ICD-10-CM | POA: Insufficient documentation

## 2011-09-28 DIAGNOSIS — L0233 Carbuncle of buttock: Secondary | ICD-10-CM

## 2011-09-28 MED ORDER — SULFAMETHOXAZOLE-TRIMETHOPRIM 800-160 MG PO TABS: 1.0000 | ORAL_TABLET | Freq: Two times a day (BID) | ORAL | Status: AC

## 2011-09-28 MED ORDER — DOXYCYCLINE HYCLATE 100 MG PO TABS: 100.0000 mg | ORAL_TABLET | Freq: Two times a day (BID) | ORAL | Status: AC

## 2011-09-28 NOTE — Progress Notes (Signed)
  Subjective:    Patient ID: Stacy Diaz, female    DOB: [birth year], 61 y.o.   MRN: [id number]  HPI Stacy Diaz is a 61 year old female married nonsmoker confined to a wheelchair because she had a brain tumor with successful surgical excision however it left her with total lower extremity paralysis who comes in today because of a bump on her left buttocks x4 days  No history of trauma however she noted a bump on her left buttocks last Thursday.   Review of Systems    general and dermatologic review of systems otherwise negative Objective:   Physical Exam  Well-developed well-nourished female in no acute distress there is a golf ball size boil left lateral buttocks      Assessment & Plan:  Boil left buttocks plan warm soaks doxycycline and Septra followup when necessary

## 2011-09-28 NOTE — Patient Instructions (Signed)
Take the doxycycline and the Septra one of each twice daily  Warm soaks 15 minutes 4 times daily  Return when necessary

## 2011-10-12 ENCOUNTER — Telehealth: Payer: Self-pay | Admitting: *Deleted

## 2011-10-12 NOTE — Telephone Encounter (Signed)
Pt is calling to let Dr. Tawanna Cooler know about her boil.  It is much smaller, but has not come to a head, otherwise it is just the same.

## 2011-10-12 NOTE — Telephone Encounter (Signed)
Since the boil has persisted I would recommend she see Dr. Emelia Loron and general surgeon for consult in the meantime continue the pain medication. She can call and make her an appointment just tell them that Dr. Tawanna Cooler referred her to Dr. Dwain Sarna

## 2011-10-12 NOTE — Telephone Encounter (Signed)
Spoke with patient.

## 2012-02-25 ENCOUNTER — Telehealth: Payer: Self-pay | Admitting: Family Medicine

## 2012-02-25 DIAGNOSIS — D332 Benign neoplasm of brain, unspecified: Secondary | ICD-10-CM

## 2012-02-25 DIAGNOSIS — E785 Hyperlipidemia, unspecified: Secondary | ICD-10-CM

## 2012-02-25 DIAGNOSIS — S8490XA Injury of unspecified nerve at lower leg level, unspecified leg, initial encounter: Secondary | ICD-10-CM

## 2012-02-25 DIAGNOSIS — I10 Essential (primary) hypertension: Secondary | ICD-10-CM

## 2012-02-25 MED ORDER — QUINAPRIL HCL 40 MG PO TABS: ORAL_TABLET | ORAL | Status: DC

## 2012-02-25 MED ORDER — HYDROCODONE-ACETAMINOPHEN 7.5-750 MG PO TABS: 1.0000 | ORAL_TABLET | Freq: Four times a day (QID) | ORAL | Status: DC | PRN

## 2012-02-25 MED ORDER — POTASSIUM CHLORIDE ER 10 MEQ PO TBCR: 10.0000 meq | EXTENDED_RELEASE_TABLET | Freq: Two times a day (BID) | ORAL | Status: DC

## 2012-02-25 MED ORDER — OXYCODONE HCL 10 MG PO TB12: 10.0000 mg | ORAL_TABLET | Freq: Two times a day (BID) | ORAL | Status: DC | PRN

## 2012-02-25 MED ORDER — ATENOLOL-CHLORTHALIDONE 50-25 MG PO TABS: 1.0000 | ORAL_TABLET | Freq: Every day | ORAL | Status: DC

## 2012-02-25 MED ORDER — SIMVASTATIN 20 MG PO TABS: 20.0000 mg | ORAL_TABLET | Freq: Every day | ORAL | Status: DC

## 2012-02-25 MED ORDER — BACLOFEN 20 MG PO TABS: ORAL_TABLET | ORAL | Status: DC

## 2012-02-25 NOTE — Telephone Encounter (Signed)
New rx ready for pick up.

## 2012-02-25 NOTE — Telephone Encounter (Signed)
Rx faxed

## 2012-02-25 NOTE — Telephone Encounter (Signed)
Pt is scheduled for a CPX in October but will be out of medication before appt and is requesting a refill on the following medications:  BACLOFEN 20 MG PO TABS,  POTASSIUM CHLORIDE ER 10 MEQ PO TBCR,  QUINAPRIL HCL 40 MG PO TABS ATENOLOL-CHLORTHALIDONE 50-25 MG PO TABS SIMVASTATIN 20 MG PO TABS HYDROCODONE-ACETAMINOPHEN 7.5-750 MG PO TABS --->pt requesting to up dose to 5 times daily  Please sent to [facility]

## 2012-02-25 NOTE — Telephone Encounter (Signed)
ok 

## 2012-02-25 NOTE — Telephone Encounter (Signed)
Patient does not want to try oxycotin right now.

## 2012-02-25 NOTE — Telephone Encounter (Signed)
Okay to increase the vicodin?

## 2012-02-25 NOTE — Telephone Encounter (Signed)
Ok,,,,,,,,,,,, but dense going to be a lot of Tylenol we might want to go ahead and just switch her to OxyContin 10 mg twice a day dispense 60 tablets

## 2012-02-25 NOTE — Telephone Encounter (Signed)
Pt called back and said that it is ok to switch to the Oxycotin.

## 2012-03-09 ENCOUNTER — Telehealth: Payer: Self-pay | Admitting: *Deleted

## 2012-03-09 NOTE — Telephone Encounter (Signed)
Patient would like to switch back to oxycodone.

## 2012-03-14 ENCOUNTER — Telehealth: Payer: Self-pay | Admitting: Family Medicine

## 2012-03-14 DIAGNOSIS — S8490XA Injury of unspecified nerve at lower leg level, unspecified leg, initial encounter: Secondary | ICD-10-CM

## 2012-03-14 MED ORDER — HYDROCODONE-ACETAMINOPHEN 7.5-750 MG PO TABS: 1.0000 | ORAL_TABLET | Freq: Four times a day (QID) | ORAL | Status: DC | PRN

## 2012-03-14 NOTE — Telephone Encounter (Signed)
The prior auth for this patient's Oxycontin was denied. It is not a covered drug. Per her insurance, pt must try THREE of the following before it will be covered: Avinza or MS extended release Exalgo Fentanyl patch Levorphanol Nucynta ER Opana ER (crush resistant) or Oxymorphone ER  Please advise. Thank you.

## 2012-03-14 NOTE — Addendum Note (Signed)
Addended by: Kern Reap B on: 03/14/2012 03:23 PM   Modules accepted: Orders

## 2012-03-14 NOTE — Telephone Encounter (Signed)
Pt called and said that she would like to switch back to Hydrocodone. Pt said to go ahead and fax script, as discussed and also pt needs a 1 month supply called in to [facility] on [address]

## 2012-03-14 NOTE — Telephone Encounter (Signed)
Okay 

## 2012-04-12 ENCOUNTER — Other Ambulatory Visit: Payer: Self-pay | Admitting: *Deleted

## 2012-04-12 DIAGNOSIS — S8490XA Injury of unspecified nerve at lower leg level, unspecified leg, initial encounter: Secondary | ICD-10-CM

## 2012-04-12 MED ORDER — HYDROCODONE-ACETAMINOPHEN 7.5-750 MG PO TABS: 1.0000 | ORAL_TABLET | Freq: Four times a day (QID) | ORAL | Status: DC | PRN

## 2012-05-10 ENCOUNTER — Other Ambulatory Visit (INDEPENDENT_AMBULATORY_CARE_PROVIDER_SITE_OTHER): Payer: Medicare Other

## 2012-05-10 DIAGNOSIS — Z79899 Other long term (current) drug therapy: Secondary | ICD-10-CM

## 2012-05-10 DIAGNOSIS — Z0000 Encounter for general adult medical examination without abnormal findings: Secondary | ICD-10-CM

## 2012-05-10 LAB — CBC WITH DIFFERENTIAL/PLATELET
Basophils Relative: 0.3 % (ref 0.0–3.0)
Eosinophils Relative: 0.1 % (ref 0.0–5.0)
HCT: 45.6 % (ref 36.0–46.0)
Hemoglobin: 15.2 g/dL — ABNORMAL HIGH (ref 12.0–15.0)
Lymphs Abs: 1.9 10*3/uL (ref 0.7–4.0)
MCV: 95.4 fl (ref 78.0–100.0)
Monocytes Absolute: 0.5 10*3/uL (ref 0.1–1.0)
Monocytes Relative: 5.5 % (ref 3.0–12.0)
RBC: 4.78 Mil/uL (ref 3.87–5.11)
WBC: 8.6 10*3/uL (ref 4.5–10.5)

## 2012-05-10 LAB — POCT URINALYSIS DIPSTICK
Blood, UA: NEGATIVE
Glucose, UA: NEGATIVE
Nitrite, UA: POSITIVE
Protein, UA: NEGATIVE
Spec Grav, UA: 1.02
Urobilinogen, UA: 0.2

## 2012-05-11 LAB — HEPATIC FUNCTION PANEL
ALT: 22 U/L (ref 0–35)
Bilirubin, Direct: 0.1 mg/dL (ref 0.0–0.3)
Total Bilirubin: 0.5 mg/dL (ref 0.3–1.2)
Total Protein: 7.5 g/dL (ref 6.0–8.3)

## 2012-05-11 LAB — TSH: TSH: 0.72 u[IU]/mL (ref 0.35–5.50)

## 2012-05-11 LAB — LIPID PANEL
LDL Cholesterol: 129 mg/dL — ABNORMAL HIGH (ref 0–99)
Total CHOL/HDL Ratio: 3
VLDL: 14.8 mg/dL (ref 0.0–40.0)

## 2012-05-11 LAB — BASIC METABOLIC PANEL
BUN: 16 mg/dL (ref 6–23)
Chloride: 100 mEq/L (ref 96–112)
GFR: 100.26 mL/min (ref >60.00)
Potassium: 3.5 mEq/L (ref 3.5–5.1)
Sodium: 137 mEq/L (ref 135–145)

## 2012-05-17 ENCOUNTER — Telehealth: Payer: Self-pay | Admitting: Family Medicine

## 2012-05-17 ENCOUNTER — Encounter: Payer: Self-pay | Admitting: Family Medicine

## 2012-05-17 ENCOUNTER — Ambulatory Visit (INDEPENDENT_AMBULATORY_CARE_PROVIDER_SITE_OTHER): Payer: Medicare Other | Admitting: Family Medicine

## 2012-05-17 VITALS — BP 130/80 | Temp 98.5°F

## 2012-05-17 DIAGNOSIS — I10 Essential (primary) hypertension: Secondary | ICD-10-CM

## 2012-05-17 DIAGNOSIS — M62838 Other muscle spasm: Secondary | ICD-10-CM

## 2012-05-17 DIAGNOSIS — E785 Hyperlipidemia, unspecified: Secondary | ICD-10-CM

## 2012-05-17 DIAGNOSIS — Z0000 Encounter for general adult medical examination without abnormal findings: Secondary | ICD-10-CM

## 2012-05-17 DIAGNOSIS — D332 Benign neoplasm of brain, unspecified: Secondary | ICD-10-CM

## 2012-05-17 DIAGNOSIS — S8490XA Injury of unspecified nerve at lower leg level, unspecified leg, initial encounter: Secondary | ICD-10-CM

## 2012-05-17 MED ORDER — QUINAPRIL HCL 40 MG PO TABS: ORAL_TABLET | ORAL | Status: DC

## 2012-05-17 MED ORDER — BACLOFEN 20 MG PO TABS: ORAL_TABLET | ORAL | Status: DC

## 2012-05-17 MED ORDER — POTASSIUM CHLORIDE ER 10 MEQ PO TBCR: 10.0000 meq | EXTENDED_RELEASE_TABLET | Freq: Two times a day (BID) | ORAL | Status: DC

## 2012-05-17 MED ORDER — DIAZEPAM 5 MG PO TABS: ORAL_TABLET | ORAL | Status: DC

## 2012-05-17 MED ORDER — ATENOLOL-CHLORTHALIDONE 50-25 MG PO TABS: 1.0000 | ORAL_TABLET | Freq: Every day | ORAL | Status: DC

## 2012-05-17 MED ORDER — SIMVASTATIN 20 MG PO TABS: 20.0000 mg | ORAL_TABLET | Freq: Every day | ORAL | Status: DC

## 2012-05-17 NOTE — Telephone Encounter (Signed)
Caller: Lisa/Pharmacist at [employer]; Patient Name: Stacy Diaz; PCP: Kelle Darting [facility]); Best Callback Phone Number: [phone]; Reason for call:Called to clarify dose for Potassium Chloride ordered 05/17/12.  Directions say "Take 1 tablet (10 mEq total) by mouth 2 (two) times daily. 2 in the morning - Oral".  Asking if MD means take 1 tablet BID or two tablets in the morning.  Patient will not be picking up prescription 05/17/12.  Please call back 05/18/12 to clarify directions. Information noted and sent to [facility] pool for follow up for pharmacist with questions about prescribed medication not covered by available resources per Medication Question Call guideline.

## 2012-05-17 NOTE — Progress Notes (Signed)
  Subjective:    Patient ID: Stacy Diaz, female    DOB: 1951/06/24, 61 y.o.   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  Her medications for pain are now Vicodin one 4 times daily, baclofen 100 mg daily however she's still having a lot of pain and also now is having a lot of episodes where she wakes up at night with for arm spasms.  She gets routine eye care, dental care, BSE monthly, and you mammography, never had a colonoscopy, tetanus 2012, seasonal flu shot today information given on shingles   Review of Systems  Constitutional: Negative.   HENT: Negative.   Eyes: Negative.   Respiratory: Negative.   Cardiovascular: Negative.   Gastrointestinal: Negative.   Genitourinary: Negative.   Musculoskeletal: Negative.   Neurological: Negative.   Hematological: Negative.   Psychiatric/Behavioral: Negative.        Objective:   Physical Exam  Constitutional: She is oriented to person, place, and time. She appears well-developed and well-nourished.  HENT:  Head: Normocephalic and atraumatic.  Right Ear: External ear normal.  Left Ear: External ear normal.  Nose: Nose normal.  Mouth/Throat: Oropharynx is clear and moist.  Eyes: EOM are normal. Pupils are equal, round, and reactive to light.  Neck: Normal range of motion. Neck supple. No thyromegaly present.  Cardiovascular: Normal rate, regular rhythm, normal heart sounds and intact distal pulses.  Exam reveals no gallop and no friction rub.   No murmur heard. Pulmonary/Chest: Effort normal and breath sounds normal.  Abdominal: Soft. Bowel sounds are normal. She exhibits no distension and no mass. There is no tenderness. There is no rebound.  Genitourinary:       Bilateral breast exam shows multiple  small fibrocystic lesions throughout both breasts  Musculoskeletal: Normal range of motion.  Lymphadenopathy:    She has no cervical adenopathy.  Neurological: She is alert and oriented to person, place, and time. She displays abnormal reflex. No cranial nerve deficit. She exhibits normal muscle tone. Coordination abnormal.       She has braces on both lower extremities and it uses a electric wheelchair  Skin: Skin is warm and dry.  Psychiatric: She has a normal mood and affect. Her behavior is normal. Judgment and thought content normal.          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 Vear Clock to see if there's anything new  Hyperlipidemia continue Zocor  Fibrocystic breast changes

## 2012-05-17 NOTE — Patient Instructions (Signed)
Continue your current medications  Add the Valium 5 mg at bedtime  Consult with Dr. Thyra Breed

## 2012-05-18 MED ORDER — POTASSIUM CHLORIDE CRYS ER 10 MEQ PO TBCR: 10.0000 meq | EXTENDED_RELEASE_TABLET | Freq: Two times a day (BID) | ORAL | Status: DC

## 2012-05-18 NOTE — Telephone Encounter (Signed)
Fleet Contras please call 2 tablets daily Ismo morning is 5

## 2012-05-18 NOTE — Telephone Encounter (Signed)
New Rx sent to pharmacy

## 2012-05-21 ENCOUNTER — Telehealth: Payer: Self-pay | Admitting: *Deleted

## 2012-05-21 NOTE — Telephone Encounter (Signed)
Patient is calling for lab results.

## 2012-05-23 NOTE — Telephone Encounter (Signed)
Please call: labs normal

## 2012-05-23 NOTE — Telephone Encounter (Signed)
PT'S HUSBAND NOTIFIED OF RESULTS

## 2012-08-23 ENCOUNTER — Telehealth: Payer: Self-pay | Admitting: *Deleted

## 2012-08-23 MED ORDER — HYDROCODONE-ACETAMINOPHEN 7.5-325 MG PO TABS: 1.0000 | ORAL_TABLET | Freq: Four times a day (QID) | ORAL | Status: DC | PRN

## 2012-08-23 NOTE — Telephone Encounter (Signed)
Patient needs a new prescription for hydrocodone with a decrease in dose of tylenol

## 2012-08-23 NOTE — Telephone Encounter (Signed)
New Rx faxed to [facility]

## 2012-08-25 ENCOUNTER — Encounter (HOSPITAL_COMMUNITY): Payer: Self-pay | Admitting: Emergency Medicine

## 2012-08-25 ENCOUNTER — Telehealth: Payer: Self-pay | Admitting: Family Medicine

## 2012-08-25 ENCOUNTER — Inpatient Hospital Stay (HOSPITAL_COMMUNITY)
Admission: EM | Admit: 2012-08-25 | Discharge: 2012-08-30 | DRG: 101 | Disposition: A | Discharge status: Skilled Nursing Facility | Payer: Medicare Other | Attending: Internal Medicine | Admitting: Internal Medicine

## 2012-08-25 ENCOUNTER — Emergency Department (HOSPITAL_COMMUNITY): Payer: Medicare Other

## 2012-08-25 DIAGNOSIS — L0232 Furuncle of buttock: Secondary | ICD-10-CM

## 2012-08-25 DIAGNOSIS — I6350 Cerebral infarction due to unspecified occlusion or stenosis of unspecified cerebral artery: Secondary | ICD-10-CM

## 2012-08-25 DIAGNOSIS — H53469 Homonymous bilateral field defects, unspecified side: Secondary | ICD-10-CM | POA: Diagnosis present

## 2012-08-25 DIAGNOSIS — G8320 Monoplegia of upper limb affecting unspecified side: Secondary | ICD-10-CM | POA: Diagnosis present

## 2012-08-25 DIAGNOSIS — E785 Hyperlipidemia, unspecified: Secondary | ICD-10-CM | POA: Diagnosis present

## 2012-08-25 DIAGNOSIS — N390 Urinary tract infection, site not specified: Secondary | ICD-10-CM | POA: Diagnosis present

## 2012-08-25 DIAGNOSIS — Z7982 Long term (current) use of aspirin: Secondary | ICD-10-CM

## 2012-08-25 DIAGNOSIS — I10 Essential (primary) hypertension: Secondary | ICD-10-CM | POA: Diagnosis present

## 2012-08-25 DIAGNOSIS — G8220 Paraplegia, unspecified: Secondary | ICD-10-CM | POA: Diagnosis present

## 2012-08-25 DIAGNOSIS — I4949 Other premature depolarization: Secondary | ICD-10-CM

## 2012-08-25 DIAGNOSIS — Z87891 Personal history of nicotine dependence: Secondary | ICD-10-CM

## 2012-08-25 DIAGNOSIS — I639 Cerebral infarction, unspecified: Secondary | ICD-10-CM

## 2012-08-25 DIAGNOSIS — S8490XA Injury of unspecified nerve at lower leg level, unspecified leg, initial encounter: Secondary | ICD-10-CM

## 2012-08-25 DIAGNOSIS — D332 Benign neoplasm of brain, unspecified: Secondary | ICD-10-CM

## 2012-08-25 DIAGNOSIS — R569 Unspecified convulsions: Secondary | ICD-10-CM

## 2012-08-25 DIAGNOSIS — G40909 Epilepsy, unspecified, not intractable, without status epilepticus: Principal | ICD-10-CM | POA: Diagnosis present

## 2012-08-25 DIAGNOSIS — Z79899 Other long term (current) drug therapy: Secondary | ICD-10-CM

## 2012-08-25 DIAGNOSIS — A498 Other bacterial infections of unspecified site: Secondary | ICD-10-CM | POA: Diagnosis present

## 2012-08-25 HISTORY — DX: Headache: R51

## 2012-08-25 HISTORY — DX: Benign neoplasm of meninges, unspecified: D32.9

## 2012-08-25 HISTORY — DX: Monoplegia of lower limb affecting unspecified side: G83.10

## 2012-08-25 HISTORY — DX: Essential (primary) hypertension: I10

## 2012-08-25 HISTORY — DX: Acute myocardial infarction, unspecified: I21.9

## 2012-08-25 HISTORY — DX: Cerebral infarction, unspecified: I63.9

## 2012-08-25 HISTORY — DX: Hyperlipidemia, unspecified: E78.5

## 2012-08-25 HISTORY — DX: Reserved for concepts with insufficient information to code with codable children: IMO0002

## 2012-08-25 HISTORY — DX: Unspecified osteoarthritis, unspecified site: M19.90

## 2012-08-25 LAB — RAPID URINE DRUG SCREEN, HOSP PERFORMED
Cocaine: NOT DETECTED
Opiates: POSITIVE — AB
Tetrahydrocannabinol: NOT DETECTED

## 2012-08-25 LAB — COMPREHENSIVE METABOLIC PANEL
AST: 18 U/L (ref 0–37)
CO2: 24 mEq/L (ref 19–32)
Calcium: 9.8 mg/dL (ref 8.4–10.5)
Creatinine, Ser: 0.52 mg/dL (ref 0.50–1.10)
GFR calc Af Amer: >90 mL/min (ref >90)
GFR calc non Af Amer: >90 mL/min (ref >90)
Total Protein: 7.2 g/dL (ref 6.0–8.3)

## 2012-08-25 LAB — DIFFERENTIAL
Basophils Absolute: 0 10*3/uL (ref 0.0–0.1)
Eosinophils Absolute: 0.1 10*3/uL (ref 0.0–0.7)
Eosinophils Relative: 1 % (ref 0–5)
Lymphocytes Relative: 30 % (ref 12–46)
Monocytes Absolute: 0.7 10*3/uL (ref 0.1–1.0)

## 2012-08-25 LAB — POCT I-STAT TROPONIN I: Troponin i, poc: 0 ng/mL (ref 0.00–0.08)

## 2012-08-25 LAB — CBC
HCT: 43.1 % (ref 36.0–46.0)
MCH: 31.4 pg (ref 26.0–34.0)
MCHC: 34.3 g/dL (ref 30.0–36.0)
MCV: 91.5 fL (ref 78.0–100.0)
RDW: 12.7 % (ref 11.5–15.5)
WBC: 7.9 10*3/uL (ref 4.0–10.5)

## 2012-08-25 LAB — URINALYSIS, ROUTINE W REFLEX MICROSCOPIC
Bilirubin Urine: NEGATIVE
Specific Gravity, Urine: 1.01 (ref 1.005–1.030)
pH: 6 (ref 5.0–8.0)

## 2012-08-25 LAB — POCT I-STAT, CHEM 8
Calcium, Ion: 1.22 mmol/L (ref 1.13–1.30)
Chloride: 103 mEq/L (ref 96–112)
Glucose, Bld: 108 mg/dL — ABNORMAL HIGH (ref 70–99)
HCT: 45 % (ref 36.0–46.0)
Hemoglobin: 15.3 g/dL — ABNORMAL HIGH (ref 12.0–15.0)
Potassium: 3.7 mEq/L (ref 3.5–5.1)

## 2012-08-25 LAB — URINE MICROSCOPIC-ADD ON

## 2012-08-25 LAB — PROTIME-INR: Prothrombin Time: 13 seconds (ref 11.6–15.2)

## 2012-08-25 LAB — ETHANOL: Alcohol, Ethyl (B): <11 mg/dL (ref 0–11)

## 2012-08-25 MED ORDER — DEXTROSE 5 % IV SOLN
1.0000 g | Freq: Every day | INTRAVENOUS | Status: DC
  Administered 2012-08-26 – 2012-08-29 (×5): 1 g via INTRAVENOUS
  Filled 2012-08-25 (×6): qty 10

## 2012-08-25 MED ORDER — BACLOFEN 20 MG PO TABS
20.0000 mg | ORAL_TABLET | Freq: Once | ORAL | Status: AC
  Administered 2012-08-25: 20 mg via ORAL
  Filled 2012-08-25: qty 1

## 2012-08-25 MED ORDER — LORAZEPAM 2 MG/ML IJ SOLN
1.0000 mg | Freq: Once | INTRAMUSCULAR | Status: DC
  Filled 2012-08-25: qty 1

## 2012-08-25 MED ORDER — ASPIRIN EC 325 MG PO TBEC
325.0000 mg | DELAYED_RELEASE_TABLET | Freq: Every day | ORAL | Status: DC
  Administered 2012-08-26 – 2012-08-30 (×5): 325 mg via ORAL
  Filled 2012-08-25 (×6): qty 1

## 2012-08-25 MED ORDER — SIMVASTATIN 20 MG PO TABS
20.0000 mg | ORAL_TABLET | Freq: Every day | ORAL | Status: DC
  Administered 2012-08-25 – 2012-08-29 (×5): 20 mg via ORAL
  Filled 2012-08-25 (×6): qty 1

## 2012-08-25 MED ORDER — SODIUM CHLORIDE 0.9 % IV SOLN
INTRAVENOUS | Status: AC
  Administered 2012-08-25: 23:00:00 via INTRAVENOUS

## 2012-08-25 MED ORDER — HEPARIN SODIUM (PORCINE) 5000 UNIT/ML IJ SOLN
5000.0000 [IU] | Freq: Three times a day (TID) | INTRAMUSCULAR | Status: DC
  Administered 2012-08-25 – 2012-08-30 (×13): 5000 [IU] via SUBCUTANEOUS
  Filled 2012-08-25 (×16): qty 1

## 2012-08-25 MED ORDER — HYDROMORPHONE HCL PF 1 MG/ML IJ SOLN
1.0000 mg | Freq: Once | INTRAMUSCULAR | Status: AC
  Administered 2012-08-25: 1 mg via INTRAVENOUS
  Filled 2012-08-25: qty 1

## 2012-08-25 MED ORDER — HYDROCODONE-ACETAMINOPHEN 7.5-325 MG PO TABS
1.0000 | ORAL_TABLET | Freq: Four times a day (QID) | ORAL | Status: DC | PRN
  Administered 2012-08-25 – 2012-08-26 (×2): 1 via ORAL
  Filled 2012-08-25 (×3): qty 1

## 2012-08-25 MED ORDER — SODIUM CHLORIDE 0.9 % IV SOLN
1000.0000 mg | Freq: Two times a day (BID) | INTRAVENOUS | Status: DC
  Administered 2012-08-25: 1000 mg via INTRAVENOUS
  Filled 2012-08-25 (×2): qty 10

## 2012-08-25 MED ORDER — ONDANSETRON HCL 4 MG/2ML IJ SOLN
4.0000 mg | Freq: Once | INTRAMUSCULAR | Status: AC
  Administered 2012-08-25: 4 mg via INTRAVENOUS
  Filled 2012-08-25: qty 2

## 2012-08-25 MED ORDER — ASPIRIN EC 81 MG PO TBEC: 81.0000 mg | DELAYED_RELEASE_TABLET | Freq: Every day | ORAL | Status: DC

## 2012-08-25 MED ORDER — DIAZEPAM 5 MG PO TABS
5.0000 mg | ORAL_TABLET | Freq: Every day | ORAL | Status: DC
  Administered 2012-08-25 – 2012-08-29 (×5): 5 mg via ORAL
  Filled 2012-08-25 (×5): qty 1

## 2012-08-25 MED ORDER — BACLOFEN 20 MG PO TABS
20.0000 mg | ORAL_TABLET | Freq: Four times a day (QID) | ORAL | Status: DC
  Administered 2012-08-25 – 2012-08-30 (×18): 20 mg via ORAL
  Filled 2012-08-25 (×22): qty 1

## 2012-08-25 MED ORDER — IBUPROFEN 200 MG PO TABS
200.0000 mg | ORAL_TABLET | Freq: Four times a day (QID) | ORAL | Status: DC | PRN
  Administered 2012-08-26 – 2012-08-29 (×4): 200 mg via ORAL
  Filled 2012-08-25 (×4): qty 1

## 2012-08-25 NOTE — Telephone Encounter (Signed)
Ed notification.

## 2012-08-25 NOTE — Telephone Encounter (Signed)
Patient Information:  Caller Name: Stacy Diaz  Phone: [phone]  Patient: Stacy Diaz, Stacy Diaz  Gender: Female  DOB: [birth year]  Age: 62 Years  PCP: Kelle Darting [facility])  Office Follow Up:  Does the office need to follow up with this patient?: Yes  Instructions For The Office: Husband directed to have patient evaluated ED/911.  Weakness on left side of body.  RN Note:  Understanding expressed. husband is concerned. Will call 911 now  Symptoms  Reason For Call & Symptoms: Husband is calling in concerning his wife.  His wife is in a wheelchair from Brain Tumor 15 years ago.  She is wheelchair bound.  She is on Baclofen for leg spams.  He states she has been having Left arm weakness for months; However, today it has worsened.  He states she has been in bed since 11:00. Weak, she cannot be of any assistance with movement or transfering.  No use of entire left side of her body, +numbness "arthritic curling"  Reviewed Health History In EMR: N/A  Reviewed Medications In EMR: N/A  Reviewed Allergies In EMR: N/A  Reviewed Surgeries / Procedures: N/A  Date of Onset of Symptoms: 08/25/2012  Guideline(s) Used:  Weakness (Generalized) and Fatigue  Neurologic Deficit  Disposition Per Guideline:   Call EMS 911 Now  Reason For Disposition Reached:   New neurologic deficit that is present NOW, sudden onset of ANY of the following:   Weakness of the face, arm, or leg on one side of the body  Numbness of the face, arm, or leg on one side of the body  Loss of speech or garbled speech  Advice Given:  N/A

## 2012-08-25 NOTE — ED Notes (Signed)
Per EMS, pt unable to move all extremities except right arm onset this am when left arm weakness and heaviness began. Left arm weakness is recurrent, but in past has returned to baseline on its own, currently left arm is still immobile. Hx brain tumor of 15 yrs, neurologist released pt a year ago. Pt able to bear wt on legs, but has no balance. BP 148/90, CBG 90, HR 100 w/ occasional PVC's, O2 99 on RA. Per EMS, no facial droop, speech is great, no visual disturbances, no trauma, no blood thinners. Lungs cta. No allergies.

## 2012-08-25 NOTE — ED Provider Notes (Signed)
History     CSN: [id number]  Arrival date & time 08/25/12  1650   First MD Initiated Contact with Patient 08/25/12 1652      Chief Complaint  Patient presents with  . Extremity Weakness    (Consider location/radiation/quality/duration/timing/severity/associated sxs/prior treatment) Patient is a 62 y.o. female presenting with neurologic complaint. The history is provided by the patient and medical records. No language interpreter was used.  Neurologic Problem The primary symptoms include focal weakness. Primary symptoms do not include headaches, seizures, visual change, speech change or fever. Primary symptoms comment: The patient says that she is unable to move her left arm and hand. She says this started about 10 AM today. There is no precipitating event. She's had episodes of numbness in her left arm before but they would always go away after about an hour.  The symptoms began 6 to 12 hours ago. Episode duration: Continuously present since 10 AM, appx. 6 hours. The symptoms are unchanged. Context: No known precipitating event.  Associated medical issues comments: She had been followed for the meningioma by Dr. Shirlean Kelly, her neurosurgeon. Her last head CT was about a year ago and was negative.Marland Kitchen    History reviewed. No pertinent past medical history.  Past Surgical History  Procedure Date  . Brain surgery     No family history on file.  History  Substance Use Topics  . Smoking status: Never Smoker   . Smokeless tobacco: Not on file  . Alcohol Use: Yes    OB History    Grav Para Term Preterm Abortions TAB SAB Ect Mult Living                  Review of Systems  Constitutional: Negative for fever and chills.  HENT: Negative.   Eyes: Negative.   Respiratory: Negative.   Cardiovascular: Negative.   Gastrointestinal: Negative.   Genitourinary: Negative.   Musculoskeletal: Negative.   Neurological: Positive for focal weakness. Negative for speech change, seizures  and headaches.       She has a spastic paraplegia.  Psychiatric/Behavioral: Negative.     Allergies  Review of patient's allergies indicates no known allergies.  Home Medications   Current Outpatient Rx  Name  Route  Sig  Dispense  Refill  . ASPIRIN EC 81 MG PO TBEC   Oral   Take 81 mg by mouth daily.         . ATENOLOL-CHLORTHALIDONE 50-25 MG PO TABS   Oral   Take 1 tablet by mouth daily.   90 tablet   3   . BACLOFEN 20 MG PO TABS   Oral   Take 20 mg by mouth 4 (four) times daily.         Marland Kitchen DIAZEPAM 5 MG PO TABS   Oral   Take 5 mg by mouth at bedtime.         Marland Kitchen HYDROCODONE-ACETAMINOPHEN 7.5-325 MG PO TABS   Oral   Take 1 tablet by mouth every 6 (six) hours as needed. For pain         . IBUPROFEN 200 MG PO TABS   Oral   Take 200 mg by mouth every 6 (six) hours as needed. For pain         . POTASSIUM CHLORIDE CRYS ER 10 MEQ PO TBCR   Oral   Take 1 tablet (10 mEq total) by mouth 2 (two) times daily.   200 tablet   3   . QUINAPRIL HCL 40  MG PO TABS   Oral   Take 80 mg by mouth every morning.         Marland Kitchen SIMVASTATIN 20 MG PO TABS   Oral   Take 1 tablet (20 mg total) by mouth at bedtime.   90 tablet   3   . AMBULATORY NON FORMULARY MEDICATION      Medication Name: Right and left double upright                               AFO's with double action                               Ankle joints   2 each   prn     BP 144/72  Pulse 91  Temp 98.6 F (37 C) (Oral)  Resp 18  SpO2 100%  Physical Exam  Nursing note and vitals reviewed. Constitutional: She is oriented to person, place, and time. She appears well-developed and well-nourished.       She leans to the left. Her left arm as held at 90 next for body, and her hand is clawed.  HENT:  Right Ear: External ear normal.  Left Ear: External ear normal.  Mouth/Throat: Oropharynx is clear and moist.       She has a transverse scar over the occipital region that is well-healed.  Eyes:  Conjunctivae normal and EOM are normal. Pupils are equal, round, and reactive to light.  Neck: Normal range of motion. Neck supple.       No carotid bruits.  Cardiovascular: Normal rate, regular rhythm and normal heart sounds.   Pulmonary/Chest: Effort normal and breath sounds normal.  Abdominal: Soft. Bowel sounds are normal.  Musculoskeletal:       Left arm held next to chest with elbow flexed at 90 degrees; left hand held in clawed position.   Neurological: She is alert and oriented to person, place, and time.       Cranial nerves intact.  She has paralysis of the left arm, with her left arm held next to her body and left hand held in clawed position. 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  Skin: Skin is warm. She is diaphoretic.  Psychiatric: She has a normal mood and affect. Her behavior is normal.    ED Course  Procedures (including critical care time)   Labs Reviewed  ETHANOL  PROTIME-INR  APTT  CBC  DIFFERENTIAL  COMPREHENSIVE METABOLIC PANEL  TROPONIN I  URINE RAPID DRUG SCREEN (HOSP PERFORMED)  URINALYSIS, ROUTINE W REFLEX MICROSCOPIC   5:29 PM  Date: 08/25/2012  Rate:88  Rhythm: normal sinus rhythm and premature ventricular contractions (PVC)  QRS Axis: normal  Intervals: normal QRS:  Poor R wave progression in precordial leads suggests old anterior myocardial infarction.  ST/T Wave abnormalities: normal  Conduction Disutrbances:none  Narrative Interpretation: Abnormal EKG  Old EKG Reviewed: none available  5:41 PM Pt seen --> physical exam performed.  Lab workup for CVA ordered.  She is outside time windows for neuro intervention.   8:25 PM Results for orders placed during the hospital encounter of 08/25/12  ETHANOL      Component Value Range   Alcohol, Ethyl (B) <11  0 - 11 mg/dL  PROTIME-INR      Component Value Range   Prothrombin Time 13.0  11.6 - 15.2 seconds   INR  0.99  0.00 - 1.49  APTT      Component Value Range   aPTT 28  24 - 37 seconds    CBC      Component Value Range   WBC 7.9  4.0 - 10.5 K/uL   RBC 4.71  3.87 - 5.11 MIL/uL   Hemoglobin 14.8  12.0 - 15.0 g/dL   HCT 16.1  09.6 - 04.5 %   MCV 91.5  78.0 - 100.0 fL   MCH 31.4  26.0 - 34.0 pg   MCHC 34.3  30.0 - 36.0 g/dL   RDW 40.9  81.1 - 91.4 %   Platelets 233  150 - 400 K/uL  DIFFERENTIAL      Component Value Range   Neutrophils Relative 59  43 - 77 %   Neutro Abs 4.7  1.7 - 7.7 K/uL   Lymphocytes Relative 30  12 - 46 %   Lymphs Abs 2.4  0.7 - 4.0 K/uL   Monocytes Relative 9  3 - 12 %   Monocytes Absolute 0.7  0.1 - 1.0 K/uL   Eosinophils Relative 1  0 - 5 %   Eosinophils Absolute 0.1  0.0 - 0.7 K/uL   Basophils Relative 0  0 - 1 %   Basophils Absolute 0.0  0.0 - 0.1 K/uL  COMPREHENSIVE METABOLIC PANEL      Component Value Range   Sodium 138  135 - 145 mEq/L   Potassium 3.7  3.5 - 5.1 mEq/L   Chloride 98  96 - 112 mEq/L   CO2 24  19 - 32 mEq/L   Glucose, Bld 110 (*) 70 - 99 mg/dL   BUN 14  6 - 23 mg/dL   Creatinine, Ser 7.82  0.50 - 1.10 mg/dL   Calcium 9.8  8.4 - 95.6 mg/dL   Total Protein 7.2  6.0 - 8.3 g/dL   Albumin 4.3  3.5 - 5.2 g/dL   AST 18  0 - 37 U/L   ALT 13  0 - 35 U/L   Alkaline Phosphatase 46  39 - 117 U/L   Total Bilirubin 0.4  0.3 - 1.2 mg/dL   GFR calc non Af Amer >90  >90 mL/min   GFR calc Af Amer >90  >90 mL/min  TROPONIN I      Component Value Range   Troponin I <0.30  <0.30 ng/mL  URINALYSIS, ROUTINE W REFLEX MICROSCOPIC      Component Value Range   Color, Urine YELLOW  YELLOW   APPearance HAZY (*) CLEAR   Specific Gravity, Urine 1.010  1.005 - 1.030   pH 6.0  5.0 - 8.0   Glucose, UA NEGATIVE  NEGATIVE mg/dL   Hgb urine dipstick SMALL (*) NEGATIVE   Bilirubin Urine NEGATIVE  NEGATIVE   Ketones, ur 15 (*) NEGATIVE mg/dL   Protein, ur NEGATIVE  NEGATIVE mg/dL   Urobilinogen, UA 0.2  0.0 - 1.0 mg/dL   Nitrite POSITIVE (*) NEGATIVE   Leukocytes, UA MODERATE (*) NEGATIVE  POCT I-STAT, CHEM 8      Component Value Range    Sodium 139  135 - 145 mEq/L   Potassium 3.7  3.5 - 5.1 mEq/L   Chloride 103  96 - 112 mEq/L   BUN 14  6 - 23 mg/dL   Creatinine, Ser 2.13  0.50 - 1.10 mg/dL   Glucose, Bld 086 (*) 70 - 99 mg/dL   Calcium, Ion 5.78  4.69 - 1.30 mmol/L   TCO2  26  0 - 100 mmol/L   Hemoglobin 15.3 (*) 12.0 - 15.0 g/dL   HCT 16.1  09.6 - 04.5 %  POCT I-STAT TROPONIN I      Component Value Range   Troponin i, poc 0.00  0.00 - 0.08 ng/mL   Comment 3           URINE MICROSCOPIC-ADD ON      Component Value Range   Squamous Epithelial / LPF FEW (*) RARE   WBC, UA 7-10  <3 WBC/hpf   RBC / HPF 0-2  <3 RBC/hpf   Bacteria, UA MANY (*) RARE   Urine-Other LESS THAN 10 mL OF URINE SUBMITTED     Ct Head Wo Contrast  08/25/2012  *RADIOLOGY REPORT*  Clinical Data: Weakness of the extremities  CT HEAD WITHOUT CONTRAST  Technique:  Contiguous axial images were obtained from the base of the skull through the vertex without contrast.  Comparison: None.  Findings: Sequelae of vertex craniotomy  identified with multiple cerclage wires and surgical clips in and around the falx cerebri Previous cranioplasty in this region.  Clear paranasal sinuses and orbits.   Encephalomalacia of the right and left parasagittal frontal parietal cortex is stable.  No definite acute infarct or hemorrhage.  No recurrent mass lesion.  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  IMPRESSION: Stable exam.  No acute intracranial findings.  Postsurgical change related to what was reportedly a benign neoplasm, likely previous meningioma removal.   Original Report Authenticated By: Davonna Belling, M.D.    CT of head did not show an acute stroke.  Case discussed with Dr. Amada Jupiter of Neurology, who advised that pt would need a medical admission to complete her stroke workup.  I discussed pt's lab findings with her and with her husband.     1. CVA (cerebral infarction)          Carleene Cooper III, MD 08/25/12 2057

## 2012-08-25 NOTE — ED Notes (Signed)
Pt unable to move left arm, but sensation in tact. Speech great, no facial droop. Pt is leaning to left side, believes this is new from this am. Pt has lower extremity paralysis post brain surgery. A&Ox4, stable.

## 2012-08-25 NOTE — Consult Note (Signed)
Reason for Consult:left arm weakness Referring Physician: Lyda Perone  CC: Arm weakness  History is obtained from:patient, husband  HPI: Stacy Diaz is a 62 y.o. female with a history of previous meningioma resection with subsequent spastic paraparesis who developed left arm weakness this morning. She has not had any tingling, positive visual symptoms or other positive symptoms.   She has had multiple episodes in the past similar to her current one, but this one has been persistent. She did have deficits on the left after her initial surgery, but they imprved.   LKW: 10 am tpa given: no, delay in arrival.   ROS: A 14 point ROS was performed and is negative except as noted in the HPI.  Past Medical History  Diagnosis Date  . HTN (hypertension)   . Hyperlipidemia   . Meningioma     Family History: htn  Social History: Tob: Quit at age 24  Exam: Current vital signs: BP 173/83  Pulse 78  Temp 98.7 F (37.1 C) (Oral)  Resp 16  SpO2 97% Vital signs in last 24 hours: Temp:  [98.6 F (37 C)-98.7 F (37.1 C)] 98.7 F (37.1 C) (01/09 1756) Pulse Rate:  [78-97] 78  (01/09 2130) Resp:  [15-23] 16  (01/09 2130) BP: (144-173)/(72-131) 173/83 mmHg (01/09 2000) SpO2:  [97 %-100 %] 97 % (01/09 2130)  General: in bed, nad CV: RRR Mental Status: Patient is awake, alert, oriented to person, place, month, year, and situation. Patient is able to give a clear and coherent history. No evidence of aphasia. No signs of dysarthria.  Cranial Nerves: II: Visual Fields are notable for complete left hemianopia.  Pupils are equal, round, and reactive to light.  Discs are difficult to visualize. III,IV, VI: EOMI without ptosis or diploplia.  V: Facial sensation is symmetric to temperature VII: Facial movement is symmetric.  VIII: hearing is intact to voice X: Uvula elevates symmetrically XI: Shoulder shrug is weak on left XII: tongue is midline without atrophy or fasciculations.    Motor: Tone is increased in left arm and bilateral lower extremities.  Bulk is normal. 5/5 strength was present in right arm, 2/5 strength throughout bilateral lower extremities, no movement in left arm.  Sensory: Sensation is symmetric to light touch and temperature in the arms and legs, though she does extinguish to simultaneous stimulation on the left.  Deep Tendon Reflexes: Present and symmetric at patella increased in left bicep Cerebellar: FNF intact on right unable to perform otherwise.  Gait: Wheelchair bound at baseline.   I have reviewed labs in epic and the results pertinent to this consultation are: CMP, CBC unremarkable. UA reveals UTI.   I have reviewed the images obtained: CT head - old right caudate infarct, bilateral changes related to previosu surgery.   Impression: 62 yo F with sudden onset left arm paralysis which has rapidly become spastic over the course of the day. On exam, she has neglect, liekly some sensory loss which she is neglecting,  and hemianopia. 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   The rapid development of increase tone is unusual, but given her previous deficits, some of this may have been already present. There is a small chance given the previous episodes that this could be seizure related, but I feel that this is much less likely given the lack of positive symptoms and constellation of symptoms fitting a vascular territory. That being said, it may be reasonable to treat for seizure until an EEG  can be obtained.   Recommendations: 1. HgbA1c, fasting lipid panel 2. Repeat head CT in 48 hours.  3. PT consult, OT consult, Speech consult 4. Echocardiogram 5. Carotid dopplers 6. Prophylactic therapy-Antiplatelet med: ASA - dose 325mg  7. Risk factor modification 8. Telemetry monitoring 9. Frequent neuro checks 10. EEG in the AM.  11. Will give single dose of Keppra, but if no EEG changes to explain symptoms, would  discontinue.      Ritta Slot, MD [facility] [phone]  If 7pm- 7am, please page neurology on call at [phone].

## 2012-08-25 NOTE — ED Notes (Signed)
CBG 98  

## 2012-08-25 NOTE — ED Notes (Signed)
Pt in CT.

## 2012-08-25 NOTE — H&P (Addendum)
[facility] History and Physical  Stacy Diaz ZOX:[id number] DOB: 1951-02-20 DOA: 08/25/2012  Referring physician: ED PCP: Evette Georges, MD  Specialists: Wandra Arthurs  Chief Complaint: LUE paralysis  HPI: Stacy Diaz is a 62 y.o. 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  The patient reports this onset at around 10 AM this morning, this is not the first time this has happened however when it has happened in the past usually it would go away after 1-2 hours.  She notes she is able to feal with the LUE but has no motor control over it.  She does not complain of facial droop nor dysarthria.  The spastic paralysis in BLE is subjectively worse today than baseline as well.  In the ED CT scan of her head demonstrated no acute abnormalities, neurology has been consulted and hospitalist has been asked to admit the patient.  Review of Systems: 12 systems reviewed and otherwise negative.  Past Medical History  Diagnosis Date  . HTN (hypertension)   . Hyperlipidemia   . Meningioma    Past Surgical History  Procedure Date  . Brain surgery    Social History:  reports that she has never smoked. She does not have any smokeless tobacco history on file. She reports that she drinks alcohol. She reports that she does not use illicit drugs.   No Known Allergies  No family history on file.  Prior to Admission medications   Medication Sig Start Date End Date Taking? Authorizing Provider  aspirin EC 81 MG tablet Take 81 mg by mouth daily.   Yes Historical Provider, MD  atenolol-chlorthalidone (TENORETIC) 50-25 MG per tablet Take 1 tablet by mouth daily. 05/17/12  Yes Roderick Pee, MD  baclofen (LIORESAL) 20 MG tablet Take 20 mg by mouth 4 (four) times daily.   Yes Historical Provider, MD  diazepam (VALIUM) 5 MG tablet Take 5 mg by mouth at bedtime.   Yes Historical Provider, MD    HYDROcodone-acetaminophen (NORCO) 7.5-325 MG per tablet Take 1 tablet by mouth every 6 (six) hours as needed. For pain   Yes Historical Provider, MD  ibuprofen (ADVIL,MOTRIN) 200 MG tablet Take 200 mg by mouth every 6 (six) hours as needed. For pain   Yes Historical Provider, MD  potassium chloride (K-DUR,KLOR-CON) 10 MEQ tablet Take 1 tablet (10 mEq total) by mouth 2 (two) times daily. 05/18/12  Yes Roderick Pee, MD  quinapril (ACCUPRIL) 40 MG tablet Take 80 mg by mouth every morning.   Yes Historical Provider, MD  simvastatin (ZOCOR) 20 MG tablet Take 1 tablet (20 mg total) by mouth at bedtime. 05/17/12  Yes Roderick Pee, MD  AMBULATORY NON FORMULARY MEDICATION Medication Name: Right and left double upright                               AFO's with double action                               Ankle joints 04/02/11   Roderick Pee, MD   Physical Exam: Filed Vitals:   08/25/12 1800 08/25/12 1900 08/25/12 1907 08/25/12 2000  BP: 153/131 151/91 151/91 173/83  Pulse: 93 89 87 97  Temp:      TempSrc:      Resp: 15  23 16 20   SpO2: 100% 99% 100% 100%     General:  NAD, resting comfortably in bed  Eyes: PEERLA EOMI  ENT: mucous membranes moist  Neck: supple w/o JVD  Cardiovascular: RRR w/o MRG  Respiratory: CTA B  Abdomen: soft, nt, nd, bs+  Skin: no rash nor lesion  Musculoskeletal: See neurologic exam  Psychiatric: normal tone and affect  Neurologic: AAOx3, patient has spasticity of BLE, her feet are in plantarflexion with the great toe dorsiflexed bilaterally, there is increased muscle tone in her calves bilaterally.  With regards to her LUE, it is in decorticate like position, there is increased tone, sensation intact, 0/5 muscle strength in tested muscle groups.  She exhibits no facial weakness, CN 2-12 grossly intact.  She has no dysarthria.   Labs on Admission:  Basic Metabolic Panel:  Lab 08/25/12 1610 08/25/12 1716  NA 139 138  K 3.7 3.7  CL 103 98  CO2 -- 24   GLUCOSE 108* 110*  BUN 14 14  CREATININE 0.80 0.52  CALCIUM -- 9.8  MG -- --  PHOS -- --   Liver Function Tests:  Lab 08/25/12 1716  AST 18  ALT 13  ALKPHOS 46  BILITOT 0.4  PROT 7.2  ALBUMIN 4.3   No results found for this basename: LIPASE:5,AMYLASE:5 in the last 168 hours No results found for this basename: AMMONIA:5 in the last 168 hours CBC:  Lab 08/25/12 1750 08/25/12 1716  WBC -- 7.9  NEUTROABS -- 4.7  HGB 15.3* 14.8  HCT 45.0 43.1  MCV -- 91.5  PLT -- 233   Cardiac Enzymes:  Lab 08/25/12 1716  CKTOTAL --  CKMB --  CKMBINDEX --  TROPONINI <0.30    BNP (last 3 results) No results found for this basename: PROBNP:3 in the last 8760 hours CBG: No results found for this basename: GLUCAP:5 in the last 168 hours  Radiological Exams on Admission: Ct Head Wo Contrast  08/25/2012  *RADIOLOGY REPORT*  Clinical Data: Weakness of the extremities  CT HEAD WITHOUT CONTRAST  Technique:  Contiguous axial images were obtained from the base of the skull through the vertex without contrast.  Comparison: None.  Findings: Sequelae of vertex craniotomy  identified with multiple cerclage wires and surgical clips in and around the falx cerebri Previous cranioplasty in this region.  Clear paranasal sinuses and orbits.   Encephalomalacia of the right and left parasagittal frontal parietal cortex is stable.  No definite acute infarct or hemorrhage.  No recurrent mass lesion.  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  IMPRESSION: Stable exam.  No acute intracranial findings.  Postsurgical change related to what was reportedly a benign neoplasm, likely previous meningioma removal.   Original Report Authenticated By: Davonna Belling, M.D.     EKG: Independently reviewed.  Assessment/Plan Principal Problem:  *Paralysis of upper limb Active Problems:  HYPERLIPIDEMIA  HYPERTENSION  INJURY, NERVE, PELVIS/LWR LIMB  NOS   1. Paralysis of the LUE - has occurred before according to the patient but hasnt lasted this long ever before.  Of note the patient stopped taking seizure medications (which she had been on for the past 14 years) in August of last year.  While a TIA is possible, I am unused to seeing Stroke present with acute onset spastic paralysis, and TIA / Stroke would be unlikely to account for her previous episodes, never the less per neurology this is a concern, so will treat as though  it were one.  Cannot obtain MRI/MRA as she has metal in her skull.  Obtaining carotid dopplers and 2d ECHO at this time.  I question if this might not be a symptom of a partial seizure, with an underlying seizure disorder unmasked due to recently being taken off of seizure medications?  EEG ordered, neurology to see patient in consultation. 2. Hyperlipidemia - continue home statin 3. HTN - SBPs running from 140s to 170s in the ED, will hold off on starting home BP meds due to stroke concern. 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 5. UTI - treating with rocephin 1gm IV q24h  Dr. Amada Jupiter of Neurology has been consulted to see the patient.   Code Status: Full Code (must indicate code status--if unknown or must be presumed, indicate so) Family Communication: Spoke with numerous family members at bedside (indicate person spoken with, if applicable, with phone number if by telephone) Disposition Plan: admit to inpatient (indicate anticipated LOS)  Time spent: 70 min  GARDNER, JARED M. [facility] Pager [phone]  If 7PM-7AM, please contact night-coverage [email or web address] Password [facility] 08/25/2012, 9:25 PM

## 2012-08-26 ENCOUNTER — Inpatient Hospital Stay (HOSPITAL_COMMUNITY): Payer: Medicare Other

## 2012-08-26 ENCOUNTER — Encounter (HOSPITAL_COMMUNITY): Payer: Self-pay | Admitting: Radiology

## 2012-08-26 DIAGNOSIS — R569 Unspecified convulsions: Secondary | ICD-10-CM

## 2012-08-26 DIAGNOSIS — N390 Urinary tract infection, site not specified: Secondary | ICD-10-CM

## 2012-08-26 DIAGNOSIS — I517 Cardiomegaly: Secondary | ICD-10-CM

## 2012-08-26 LAB — HEMOGLOBIN A1C
Hgb A1c MFr Bld: 5.6 % (ref <5.7)
Mean Plasma Glucose: 114 mg/dL (ref <117)

## 2012-08-26 LAB — LIPID PANEL
Cholesterol: 164 mg/dL (ref 0–200)
HDL: 75 mg/dL (ref >39)
LDL Cholesterol: 75 mg/dL (ref 0–99)
Total CHOL/HDL Ratio: 2.2 ratio
Triglycerides: 69 mg/dL (ref <150)
VLDL: 14 mg/dL (ref 0–40)

## 2012-08-26 MED ORDER — LEVETIRACETAM 250 MG PO TABS
250.0000 mg | ORAL_TABLET | Freq: Two times a day (BID) | ORAL | Status: DC
  Administered 2012-08-26 – 2012-08-30 (×9): 250 mg via ORAL
  Filled 2012-08-26 (×10): qty 1

## 2012-08-26 MED ORDER — ONDANSETRON HCL 4 MG/2ML IJ SOLN
4.0000 mg | Freq: Four times a day (QID) | INTRAMUSCULAR | Status: DC | PRN
  Administered 2012-08-26 – 2012-08-29 (×3): 4 mg via INTRAVENOUS
  Filled 2012-08-26 (×2): qty 2

## 2012-08-26 MED ORDER — ONDANSETRON HCL 4 MG/2ML IJ SOLN
INTRAMUSCULAR | Status: AC
  Administered 2012-08-26: 4 mg via INTRAVENOUS
  Filled 2012-08-26: qty 2

## 2012-08-26 MED ORDER — HYDROMORPHONE HCL PF 1 MG/ML IJ SOLN
0.5000 mg | Freq: Once | INTRAMUSCULAR | Status: AC
  Administered 2012-08-26: 0.5 mg via INTRAVENOUS
  Filled 2012-08-26: qty 1

## 2012-08-26 MED ORDER — LORAZEPAM 2 MG/ML IJ SOLN
1.0000 mg | Freq: Once | INTRAMUSCULAR | Status: AC
  Administered 2012-08-26: 1 mg via INTRAVENOUS

## 2012-08-26 MED ORDER — IOHEXOL 350 MG/ML SOLN
50.0000 mL | Freq: Once | INTRAVENOUS | Status: AC | PRN
  Administered 2012-08-26: 50 mL via INTRAVENOUS

## 2012-08-26 MED ORDER — METHOCARBAMOL 500 MG PO TABS
500.0000 mg | ORAL_TABLET | Freq: Once | ORAL | Status: AC
  Administered 2012-08-26: 500 mg via ORAL
  Filled 2012-08-26: qty 1

## 2012-08-26 MED ORDER — HYDROCODONE-ACETAMINOPHEN 7.5-325 MG PO TABS
1.0000 | ORAL_TABLET | ORAL | Status: DC | PRN
  Administered 2012-08-26 – 2012-08-30 (×19): 1 via ORAL
  Filled 2012-08-26 (×19): qty 1

## 2012-08-26 NOTE — Progress Notes (Signed)
Patient became nauseated and vomited x 2 after IV Keppra was completed. Vital signs WDL for patient with no change in neuro status noted. Notified MD and orders received for STAT head CT and Zofran IV PRN for nausea/vomiting. Continuing to monitor closely.

## 2012-08-26 NOTE — Evaluation (Signed)
Physical Therapy Evaluation Patient Details Name: Stacy Diaz MRN: [id number] DOB: [birth year] Today's Date: 08/26/2012 Time: 7829-5621 PT Time Calculation (min): 56 min  PT Assessment / Plan / Recommendation Clinical Impression  pt admitted with acute onset of L UE paresis that has not resolved. She also is experiencing elevated spasticity (ashworth of 5/5) in her spastically plegic legs.  Pt can benefit from PT to maximize function adn decrease burden of care.    PT Assessment  Patient needs continued PT services    Follow Up Recommendations  CIR    Does the patient have the potential to tolerate intense rehabilitation      Barriers to Discharge None      Equipment Recommendations   (TBA post acute)    Recommendations for Other Services     Frequency Min 3X/week    Precautions / Restrictions Precautions Precautions: Fall Required Braces or Orthoses:  (Has Bil AFO's, but not present) Restrictions Weight Bearing Restrictions: No   Pertinent Vitals/Pain 5 incr to 8/10 pain, mostly in upper thighs/hips      Mobility  Bed Mobility Bed Mobility: Supine to Sit;Sitting - Scoot to [place] of Bed;Sit to Supine Supine to Sit: 1: +2 Total assist;HOB elevated Supine to Sit: Patient Percentage: 10% Sitting - Scoot to Edge of Bed: 1: +1 Total assist Sitting - Scoot to Edge of Bed: Patient Percentage: 10% Sit to Supine: 1: +2 Total assist;HOB flat Sit to Supine: Patient Percentage: 10% Details for Bed Mobility Assistance: any pt assist coming from R UE and some trunk; significant assist to help come forward and overcome her tone Transfers Transfers: Not assessed Ambulation/Gait Ambulation/Gait Assistance: Not tested (comment) Stairs: No Wheelchair Mobility Wheelchair Mobility: No    Shoulder Instructions     Exercises     PT Diagnosis: Generalized weakness;Other (comment) (hemiplegia/paresis L Ue)  PT Problem List: Decreased strength;Decreased range of motion;Decreased  balance;Decreased activity tolerance;Decreased mobility;Decreased coordination;Decreased knowledge of use of DME;Pain PT Treatment Interventions: DME instruction;Functional mobility training;Therapeutic activities;Balance training;Patient/family education;Neuromuscular re-education   PT Goals Acute Rehab PT Goals PT Goal Formulation: With patient/family Time For Goal Achievement: 08/26/12 Potential to Achieve Goals: Fair Pt will Roll Supine to Right Side: with mod assist PT Goal: Rolling Supine to Right Side - Progress: Goal set today Pt will Roll Supine to Left Side: with mod assist PT Goal: Rolling Supine to Left Side - Progress: Goal set today Pt will go Supine/Side to Sit: with mod assist PT Goal: Supine/Side to Sit - Progress: Goal set today Pt will Transfer Bed to Chair/Chair to Bed: with mod assist PT Transfer Goal: Bed to Chair/Chair to Bed - Progress: Goal set today  Visit Information  Last PT Received On: 08/26/12 Assistance Needed: +2 PT/OT Co-Evaluation/Treatment: Yes    Subjective Data  Subjective: My thighs are hurting so bad that I can't go to the next procedure without medicine Patient Stated Goal: Figure out what's wrong with my arm   Prior Functioning  Home Living Lives With: Spouse Available Help at Discharge: Family;Available 24 hours/day Type of Home: House Home Access: Other (comment) Home Layout: One level Bathroom Shower/Tub:  (sponge bath) Bathroom Toilet: Standard Bathroom Accessibility: No Home Adaptive Equipment: Wheelchair - powered;Bedside commode/3-in-1 Additional Comments: spastic BIL LE that allow for stand pivot into w/c Prior Function Level of Independence: Needs assistance Able to Take Stairs?: No Driving: No Vocation: On disability Comments: Wears AFO plastic into shoes (made by [employer]) Communication Communication: No difficulties Dominant Hand: Right    Cognition  Overall Cognitive Status: Appears within functional limits for  tasks assessed/performed Arousal/Alertness: Awake/alert Orientation Level: Oriented X4 / Intact Behavior During Session: [facility] for tasks performed    Extremity/Trunk Assessment Right Upper Extremity Assessment RUE ROM/Strength/Tone: Within functional levels Right Lower Extremity Assessment RLE ROM/Strength/Tone: Deficits RLE ROM/Strength/Tone Deficits: spasic plegia with trace gross extension bilaterally Left Lower Extremity Assessment LLE ROM/Strength/Tone: Deficits LLE ROM/Strength/Tone Deficits: see R LE Trunk Assessment Trunk Assessment: Normal   Balance Balance Balance Assessed: Yes Static Sitting Balance Static Sitting - Balance Support: Feet unsupported;Right upper extremity supported Static Sitting - Level of Assistance: 3: Mod assist Static Sitting - Comment/# of Minutes: >20 minutes assessing balance, vision and tonal quality  End of Session PT - End of Session Activity Tolerance: Patient limited by pain;Patient tolerated treatment well Patient left: in bed;with call bell/phone within reach;with nursing in room Nurse Communication: Need for lift equipment;Mobility status  GP     Deanda Ruddell, Eliseo Gum 08/26/2012, 3:01 PM  08/26/2012  Natalia Bing, PT [phone] (pager)

## 2012-08-26 NOTE — Progress Notes (Signed)
EEG completed.

## 2012-08-26 NOTE — Progress Notes (Signed)
[facility] PROGRESS NOTE  LAURELLE SKIVER ZOX:[id number] DOB: 1951/03/06 DOA: 08/25/2012 PCP: Evette Georges, MD  Assessment/Plan: 1. Paralysis of the LUE - has occurred before according to the patient but hasnt lasted this long ever before. Of note the patient stopped taking seizure medications (which she had been on for the past 14 years) in August of last year. Cannot obtain MRI/MRA as she has metal in her skull. Obtaining carotid dopplers and 2d ECHO. I question if this might not be a symptom of a partial seizure, with an underlying seizure disorder unmasked due to recently being taken off of seizure medications? EEG ordered, neurology to see patient in consultation- appreciate their help- CTA of brain ordered. 2. Hyperlipidemia - continue home statin 3. HTN -  will hold off on starting home BP meds due to stroke concern. 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 5. UTI - treating with rocephin 1gm IV q24h- await culture  Code Status: full Family Communication: husband at bedside Disposition Plan:    Consultants:  neurology  Procedures:    Antibiotics:  Rocephin 1/9  HPI/Subjective: Few episodes of vomiting today  Objective: Filed Vitals:   08/26/12 0016 08/26/12 0200 08/26/12 0600 08/26/12 0800  BP: 121/63 129/65 146/72 128/79  Pulse: 100  105 110  Temp: 98.7 F (37.1 C)  98.1 F (36.7 C) 98.4 F (36.9 C)  TempSrc: Oral   Oral  Resp:   18 18  Height: 5\' 2"  (1.575 m)     Weight: 72.122 kg (159 lb)     SpO2: 100%  100% 98%    Intake/Output Summary (Last 24 hours) at 08/26/12 1050 Last data filed at 08/26/12 8119  Gross per 24 hour  Intake 1227.5 ml  Output     50 ml  Net 1177.5 ml   Filed Weights   08/26/12 0016  Weight: 72.122 kg (159 lb)    Exam:   General:  A+Ox3, NAD  Cardiovascular: rrr  Respiratory: clear anterior  Abdomen: +BS,soft, NT  Paralysis of left  arm  Data Reviewed: Basic Metabolic Panel:  Lab 08/25/12 1478 08/25/12 1716  NA 139 138  K 3.7 3.7  CL 103 98  CO2 -- 24  GLUCOSE 108* 110*  BUN 14 14  CREATININE 0.80 0.52  CALCIUM -- 9.8  MG -- --  PHOS -- --   Liver Function Tests:  Lab 08/25/12 1716  AST 18  ALT 13  ALKPHOS 46  BILITOT 0.4  PROT 7.2  ALBUMIN 4.3   No results found for this basename: LIPASE:5,AMYLASE:5 in the last 168 hours No results found for this basename: AMMONIA:5 in the last 168 hours CBC:  Lab 08/25/12 1750 08/25/12 1716  WBC -- 7.9  NEUTROABS -- 4.7  HGB 15.3* 14.8  HCT 45.0 43.1  MCV -- 91.5  PLT -- 233   Cardiac Enzymes:  Lab 08/25/12 1716  CKTOTAL --  CKMB --  CKMBINDEX --  TROPONINI <0.30   BNP (last 3 results) No results found for this basename: PROBNP:3 in the last 8760 hours CBG:  Lab 08/25/12 1755  GLUCAP 98    No results found for this or any previous visit (from the past 240 hour(s)).   Studies: Ct Head Wo Contrast  08/26/2012  *RADIOLOGY REPORT*  Clinical Data: Concern for acute stroke versus seizure.  Evaluate for interval change.  CT HEAD WITHOUT CONTRAST  Technique:  Contiguous axial images  were obtained from the base of the skull through the vertex without contrast.  Comparison:  08/25/2012  Findings: Motion degraded exam.  No obvious interval change in the bihemispheric encephalomalacia related previous surgery.  No demonstrable areas of cortical infarction or intracranial hemorrhage.  IMPRESSION: Within limits of detection on this motion degraded exam, no interval change from earlier examination.   Original Report Authenticated By: Davonna Belling, M.D.    Ct Head Wo Contrast  08/25/2012  *RADIOLOGY REPORT*  Clinical Data: Weakness of the extremities  CT HEAD WITHOUT CONTRAST  Technique:  Contiguous axial images were obtained from the base of the skull through the vertex without contrast.  Comparison: None.  Findings: Sequelae of vertex craniotomy  identified with  multiple cerclage wires and surgical clips in and around the falx cerebri Previous cranioplasty in this region.  Clear paranasal sinuses and orbits.   Encephalomalacia of the right and left parasagittal frontal parietal cortex is stable.  No definite acute infarct or hemorrhage.  No recurrent mass lesion.  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  IMPRESSION: Stable exam.  No acute intracranial findings.  Postsurgical change related to what was reportedly a benign neoplasm, likely previous meningioma removal.   Original Report Authenticated By: Davonna Belling, M.D.     Scheduled Meds:   . sodium chloride   Intravenous STAT  . aspirin EC  325 mg Oral Daily  . baclofen  20 mg Oral QID  . cefTRIAXone (ROCEPHIN)  IV  1 g Intravenous QHS  . diazepam  5 mg Oral QHS  . heparin  5,000 Units Subcutaneous Q8H  . levETIRAcetam  250 mg Oral BID  . LORazepam  1 mg Intravenous Once  . simvastatin  20 mg Oral QHS   Continuous Infusions:   Principal Problem:  *Paralysis of upper limb Active Problems:  HYPERLIPIDEMIA  HYPERTENSION  INJURY, NERVE, PELVIS/LWR LIMB NOS    Time spent: 35    [facility], [place]  [facility] Pager [phone]. If 8PM-8AM, please contact night-coverage at [email or web address], password [facility] 08/26/2012, 10:50 AM  LOS: 1 day

## 2012-08-26 NOTE — Evaluation (Addendum)
Occupational Therapy Evaluation Patient Details Name: Stacy Diaz MRN: [id number] DOB: Jan 05, 1951 Today's Date: 08/26/2012 Time: 4098-1191 OT Time Calculation (min): 56 min  OT Assessment / Plan / Recommendation Clinical Impression  62 yo female with previous brain surgery >14 years ago to remove Meningioma. Pt presents now with Lt UE paresis. pt at baseline has spasticity in BIL LE. pt is able to complete stand pivot (A) from husband onto 3n1 and power chair. pt with new onset of Lt homonymous hemianopsia. Pt with modified ashworth 4 out 4 BIL LE  and Lt UE has 2 out 4. OT to follow acutely. Recommend CIR for d/c planning .      OT Assessment  Patient needs continued OT Services    Follow Up Recommendations  CIR    Barriers to Discharge      Equipment Recommendations  3 in 1 bedside comode;Wheelchair (measurements OT);Wheelchair cushion (measurements OT)    Recommendations for Other Services Rehab consult  Frequency  Min 3X/week    Precautions / Restrictions Precautions Precautions: Fall Required Braces or Orthoses:  (Has Bil AFO's, but not present) Restrictions Weight Bearing Restrictions: No   Pertinent Vitals/Pain 8 out 10 pain at hips     ADL  Eating/Feeding: Set up Where Assessed - Eating/Feeding: Bed level Grooming: Wash/dry face;Set up Where Assessed - Grooming: Supine, head of bed up Lower Body Dressing: +1 Total assistance Where Assessed - Lower Body Dressing: Supine, head of bed up Transfers/Ambulation Related to ADLs: pt demonstrates BIL LE tone with BIL feet in extension limiting mobility to EOB. pt is unsafe to stand at this time and unabel to tolerate due to pain. Pt at baseline with drop foot per husband. Pt is able to active hip extensors to perfom knee extension with knee in flexion. Pt requires Modified Ashworth 4 rigid with all movement   ADL Comments: Pt supine on arrival with head rotatated to the Rt side and attending to the right side. Pt  demonstates Lt side homonymous hemianopsia see vision assessment. pt with lt eye esotropia (inward rotation) Pt requesting bed pan and requires total (A) for hygiene and log rolling. pt required total+2 pt 20% to get onto bed pan. pt long rolled onto Rt side in attempt to decrease tone. pt with LT UE decrease tone with side lying. Pt provided PROM  using PNF D2 flexion 20 reps. pt with digits in extension after PROM and side lying. Pt reports decreased pain in Lt UE. Pt no active ROM noted.     OT Diagnosis: Disturbance of vision;Paresis  OT Problem List: Decreased strength;Decreased activity tolerance;Impaired balance (sitting and/or standing);Decreased safety awareness;Decreased knowledge of use of DME or AE;Decreased knowledge of precautions;Impaired tone;Impaired UE functional use;Decreased range of motion;Impaired vision/perception OT Treatment Interventions: Self-care/ADL training;Neuromuscular education;DME and/or AE instruction;Therapeutic activities;Patient/family education;Balance training   OT Goals Acute Rehab OT Goals OT Goal Formulation: With patient/family Time For Goal Achievement: 09/09/12 Potential to Achieve Goals: Good ADL Goals Pt Will Perform Grooming: with set-up;Sitting, chair;Supported ADL Goal: Grooming - Progress: Goal set today Pt Will Perform Upper Body Bathing: with set-up;Sitting at sink;Sitting, chair;Supported ADL Goal: Upper Body Bathing - Progress: Goal set today Pt Will Perform Upper Body Dressing: with min assist;Sitting, chair;Supported ADL Goal: Location manager Dressing - Progress: Goal set today Pt Will Transfer to Toilet: with max assist;Stand pivot transfer;3-in-1 ADL Goal: Toilet Transfer - Progress: Goal set today Miscellaneous OT Goals Miscellaneous OT Goal #1: Pt will sit at EOB with min guard  (A) for  5 minutes as precursor to adls OT Goal: Miscellaneous Goal #1 - Progress: Goal set today Miscellaneous OT Goal #2: Pt will complete basic transfer  sit<>stand from EOB with Total+2 pt 50%  OT Goal: Miscellaneous Goal #2 - Progress: Goal set today  Visit Information  Last OT Received On: 08/26/12 Assistance Needed: +2 PT/OT Co-Evaluation/Treatment: Yes    Subjective Data  Subjective: "I can't lift my arm"- pt states   Prior Functioning     Home Living Lives With: Spouse Available Help at Discharge: Family;Available 24 hours/day Type of Home: House Home Access: Other (comment) Home Layout: One level Bathroom Shower/Tub:  (sponge bath) Bathroom Toilet: Standard Bathroom Accessibility: No Home Adaptive Equipment: Bedside commode/3-in-1;Other (comment) (Jazzy power chair) Additional Comments: spastic BIL LE that allow for stand pivot into w/c Prior Function Level of Independence: Needs assistance Needs Assistance: Dressing;Toileting;Meal Prep;Light Housekeeping;Transfers Dressing: Maximal Toileting: Maximal Meal Prep: Total Light Housekeeping: Total Able to Take Stairs?: No Driving: No Vocation: On disability Comments: Wears AFO plastic into shoes (made by [employer]) Communication Communication: No difficulties Dominant Hand: Right         Vision/Perception Vision - Assessment Eye Alignment: Impaired (comment) Vision Assessment: Vision tested Ocular Range of Motion: Within Functional Limits Alignment/Gaze Preference: Head tilt;Gaze right (esotropia of Lt eye) Tracking/Visual Pursuits: Decreased smoothness of eye movement to LEFT inferior field;Decreased smoothness of eye movement to LEFT superior field;Requires cues, head turns, or add eye shifts to track;Unable to hold eye position out of midline;Impaired - to be further tested in functional context Saccades: Additional head turns occurred during testing (due to visual field cut unable to complete) Convergence: Impaired (comment) Visual Fields: Left homonymous hemianopsia  Pt provided cover uncover testing: Pt demonstrates lateral nystagmus with testing. Pt with  normal pupil reaction to light.   Pt with head tilt to the right throughout session.     Cognition  Overall Cognitive Status: Appears within functional limits for tasks assessed/performed Arousal/Alertness: Awake/alert Orientation Level: Oriented X4 / Intact Behavior During Session: [facility] for tasks performed    Extremity/Trunk Assessment Right Upper Extremity Assessment RUE ROM/Strength/Tone: Within functional levels RUE Sensation: WFL - Light Touch RUE Coordination: WFL - gross motor Left Upper Extremity Assessment LUE ROM/Strength/Tone: Deficits LUE ROM/Strength/Tone Deficits: Tone present MOdified ashworth scale 2 out 4. Pt provided PNF D2 to assist with decrease tone. LUE Sensation: Deficits Right Lower Extremity Assessment RLE ROM/Strength/Tone: Deficits RLE ROM/Strength/Tone Deficits: spasic plegia with trace gross extension bilaterally Left Lower Extremity Assessment LLE ROM/Strength/Tone: Deficits LLE ROM/Strength/Tone Deficits: see R LE Trunk Assessment Trunk Assessment: Normal     Mobility Bed Mobility Bed Mobility: Supine to Sit;Sitting - Scoot to [place] of Bed;Sit to Supine Supine to Sit: 1: +2 Total assist;HOB elevated Supine to Sit: Patient Percentage: 10% Sitting - Scoot to Edge of Bed: 1: +1 Total assist Sitting - Scoot to Edge of Bed: Patient Percentage: 10% Sit to Supine: 1: +2 Total assist;HOB flat Sit to Supine: Patient Percentage: 10% Details for Bed Mobility Assistance: any pt assist coming from R UE and some trunk; significant assist to help come forward and overcome her tone Transfers Transfers: Not assessed     Shoulder Instructions     Exercise     Balance Balance Balance Assessed: Yes Static Sitting Balance Static Sitting - Balance Support: Feet unsupported;Right upper extremity supported Static Sitting - Level of Assistance: 3: Mod assist Static Sitting - Comment/# of Minutes: >20 minutes assessing balance, vision and tonal quality   End of  Session  OT - End of Session Activity Tolerance: Patient tolerated treatment well Patient left: in bed;with call bell/phone within reach;with family/visitor present Nurse Communication: Mobility status;Precautions  GO     Lucile Shutters 08/26/2012, 3:33 PM Pager: [phone]

## 2012-08-26 NOTE — Evaluation (Signed)
Speech Language Pathology Evaluation Patient Details Name: Stacy Diaz MRN: [id number] DOB: 05/08/1951 Today's Date: 08/26/2012 Time: 1100-1119 SLP Time Calculation (min): 19 min  Problem List:  Patient Active Problem List  Diagnosis  . BENIGN NEOPLASM OF BRAIN  . HYPERLIPIDEMIA  . HYPERTENSION  . PREMATURE VENTRICULAR CONTRACTIONS  . INJURY, NERVE, PELVIS/LWR LIMB NOS  . Boil of buttock  . Paralysis of upper limb  . Seizures  . UTI (lower urinary tract infection)   Past Medical History:  Past Medical History  Diagnosis Date  . HTN (hypertension)   . Hyperlipidemia   . Meningioma 1999  . Myocardial infarction 1985?  Marland Kitchen Headache     "not real frequent" (08/25/2012)  . Arthritis     "probably" (08/25/2012)  . Stroke 08/25/2012    "that's what they think I've had; sudden LUE weakness" (08/25/2012)  . Spastic paraparesis     S/P meningioma resection 1999/notes 08/25/2012  . Paresis of lower extremity 1999    BLE/notes 08/25/2012   Past Surgical History:  Past Surgical History  Procedure Date  . Coronary angioplasty 1970's  . Cardiac catheterization 1970's  . Brain meningioma excision 1999   HPI:  Stacy Diaz is a 62 y.o. female presenting with left arm weakness. Husband reports she has had greater than a dozen episodes of left arm numbness and weakness over the past year or so. CT done by [facility] 1 yr ago showed no regrowth of the meningioma.  Initial CT imaging confirms no acute abnormality. Suspect right brain infarct vs seizure.  Work up underway. On aspirin 81 mg orally every day prior to admission. Now on aspirin 325 mg orally every day for secondary stroke prevention. Patient with resultant left hemiparesis, left homonymous hemianopsia.   Assessment / Plan / Recommendation Clinical Impression  Pt presents with mild cognitive deficits including impaired selective attention, verbal problem solving of moderate complexity, awareness of visual deficits. Pt required min  verbal and visual cues to scan to left for reading task, was initially not aware or self correcting nonsensical passages when read aloud. Pt suffering from headache at the time of assessment which may have impacted the abolity to participate in higher level attention tasks. Husband does admit to change from baseline with thinking and vision. Brief eval due to pt leaving for procedure. Will f/u with therapeutic intervention for right attention as well as higher level cognition for increased independence with functional tasks.     SLP Assessment  Patient needs continued Speech Lanaguage Pathology Services    Follow Up Recommendations  Inpatient Rehab    Frequency and Duration min 2x/week  2 weeks   Pertinent Vitals/Diaz NA   SLP Goals  SLP Goals Potential to Achieve Goals: Good Progress/Goals/Alternative treatment plan discussed with pt/caregiver and they: Agree SLP Goal #1: Pt will scan to to far left to locate objects in 80% of opportunities during functional task with min verbal/visual cues SLP Goal #2: Pt will look for the left edge of the page with min verbal cues while reading at paragraph level with 80% accuracy.  SLP Goal #3: Pt will demonstrate complex verbal problem solving in three opportunities with supervision cues   SLP Evaluation Prior Functioning  Cognitive/Linguistic Baseline: Within functional limits Type of Home: House Lives With: Spouse Available Help at Discharge: Family;Available 24 hours/day Vocation: On disability   Cognition  Overall Cognitive Status: Impaired Arousal/Alertness: Awake/alert Orientation Level: Oriented to person;Oriented to place;Oriented to situation;Disoriented to time Attention: Focused;Sustained Focused Attention: Appears  intact Sustained Attention: Impaired Sustained Attention Impairment: Functional complex Memory: Appears intact Awareness: Impaired Awareness Impairment: Intellectual impairment;Emergent impairment (of Left visual  deficits) Problem Solving: Impaired Problem Solving Impairment: Functional complex;Verbal complex Executive Function: Self Monitoring;Self Correcting;Reasoning Reasoning: Impaired Self Monitoring: Impaired Self Monitoring Impairment: Functional basic Self Correcting: Impaired Self Correcting Impairment: Functional basic Safety/Judgment: Impaired    Comprehension  Auditory Comprehension Overall Auditory Comprehension: Appears within functional limits for tasks assessed Visual Recognition/Discrimination Discrimination: Exceptions to [facility] L/R Discrimination:  (required cues to direct attention to midline/left) Reading Comprehension Reading Status: Impaired Word level: Not tested Sentence Level: Impaired Paragraph Level: Impaired Interfering Components: Left neglect/inattention    Expression Verbal Expression Overall Verbal Expression: Appears within functional limits for tasks assessed   Oral / Motor Oral Motor/Sensory Function Overall Oral Motor/Sensory Function: Appears within functional limits for tasks assessed Motor Speech Overall Motor Speech: Appears within functional limits for tasks assessed   GO    Harlon Ditty, MA CCC-SLP [phone]  Claudine Mouton 08/26/2012, 11:48 AM

## 2012-08-26 NOTE — Progress Notes (Signed)
Stroke Team Progress Note  HISTORY Stacy Diaz is a 62 y.o. female with a history of previous meningioma resection with subsequent spastic paraparesis who developed left arm weakness this morning 08/25/2012. She has not had any tingling, positive visual symptoms or other positive symptoms. She has had multiple episodes in the past similar to her current one, but this one has been persistent. She did have deficits on the left after her initial surgery, but they imprved. Patient was not a TPA candidate secondary to delay in arrival. She was admitted for further evaluation and treatment.  SUBJECTIVE Her husband is at the bedside. He is a good historian.  Overall she feels her condition is unchanged. She still has left hemiparesis.  OBJECTIVE Most recent Vital Signs: Filed Vitals:   08/26/12 0016 08/26/12 0200 08/26/12 0600 08/26/12 0800  BP: 121/63 129/65 146/72 128/79  Pulse: 100  105 110  Temp: 98.7 F (37.1 C)  98.1 F (36.7 C) 98.4 F (36.9 C)  TempSrc: Oral   Oral  Resp:   18 18  Height: 5\' 2"  (1.575 m)     Weight: 72.122 kg (159 lb)     SpO2: 100%  100% 98%   CBG (last 3)  No results found for this basename: GLUCAP:3 in the last 72 hours  IV Fluid Intake:     MEDICATIONS    . sodium chloride   Intravenous STAT  . aspirin EC  325 mg Oral Daily  . baclofen  20 mg Oral QID  . cefTRIAXone (ROCEPHIN)  IV  1 g Intravenous QHS  . diazepam  5 mg Oral QHS  . heparin  5,000 Units Subcutaneous Q8H  . LORazepam  1 mg Intravenous Once  . simvastatin  20 mg Oral QHS   PRN:  HYDROcodone-acetaminophen, ibuprofen, ondansetron  Diet:  Cardiac thin liquids Activity:  Bedrest, OOB with assistance DVT Prophylaxis:  Heparin 5000 units sq tid  CLINICALLY SIGNIFICANT STUDIES Basic Metabolic Panel:  Lab 08/25/12 4098 08/25/12 1716  NA 139 138  K 3.7 3.7  CL 103 98  CO2 -- 24  GLUCOSE 108* 110*  BUN 14 14  CREATININE 0.80 0.52  CALCIUM -- 9.8  MG -- --  PHOS -- --   Liver  Function Tests:  Lab 08/25/12 1716  AST 18  ALT 13  ALKPHOS 46  BILITOT 0.4  PROT 7.2  ALBUMIN 4.3   CBC:  Lab 08/25/12 1750 08/25/12 1716  WBC -- 7.9  NEUTROABS -- 4.7  HGB 15.3* 14.8  HCT 45.0 43.1  MCV -- 91.5  PLT -- 233   Coagulation:  Lab 08/25/12 1716  LABPROT 13.0  INR 0.99   Cardiac Enzymes:  Lab 08/25/12 1716  CKTOTAL --  CKMB --  CKMBINDEX --  TROPONINI <0.30   Urinalysis:  Lab 08/25/12 1909  COLORURINE YELLOW  LABSPEC 1.010  PHURINE 6.0  GLUCOSEU NEGATIVE  HGBUR SMALL*  BILIRUBINUR NEGATIVE  KETONESUR 15*  PROTEINUR NEGATIVE  UROBILINOGEN 0.2  NITRITE POSITIVE*  LEUKOCYTESUR MODERATE*   Lipid Panel    Component Value Date/Time   CHOL 164 08/26/2012 0749   TRIG 69 08/26/2012 0749   HDL 75 08/26/2012 0749   CHOLHDL 2.2 08/26/2012 0749   VLDL 14 08/26/2012 0749   LDLCALC 75 08/26/2012 0749   HgbA1C  No results found for this basename: HGBA1C    Urine Drug Screen:     Component Value Date/Time   LABOPIA POSITIVE* 08/25/2012 1909   COCAINSCRNUR NONE DETECTED 08/25/2012 1909  LABBENZ POSITIVE* 08/25/2012 1909   AMPHETMU NONE DETECTED 08/25/2012 1909   THCU NONE DETECTED 08/25/2012 1909   LABBARB NONE DETECTED 08/25/2012 1909    Alcohol Level:  Lab 08/25/12 1716  ETH <11   CT of the brain   08/26/2012  Within limits of detection on this motion degraded exam, no interval change from earlier examination.   08/25/2012  Stable exam.  No acute intracranial findings.  Postsurgical change related to what was reportedly a benign neoplasm, likely previous meningioma removal.   MRI of the brain    MRA of the brain    2D Echocardiogram    Carotid Doppler    CXR    EKG  normal sinus rhythm.   Therapy Recommendations   Physical Exam   Pleasant middle-aged Caucasian lady currently not in distress.Frontal alopecia at craniotomy site.Awake alert. Afebrile. Head is nontraumatic. Neck is supple without bruit. Hearing is normal. Cardiac exam no murmur or  gallop. Lungs are clear to auscultation. Distal pulses are well felt.   Neurological Exam :Awake alert oriented x e. Speech and language are normal. Eye movements full range. Dense left homonymous hemianopsia. Left lower face weakness. Fundi not visualized. Mild left neglect. Dense left hemiplegia with 0/5 LUE and 1/5 LLE.Good RUE strength. RLE weak 1/5. Decrease sensation left hemibody. Plantars not elicitible. ASSESSMENT [name] is a 62 y.o. female presenting with left arm weakness. Husband reports she has had greater than a dozen episodes of left arm numbness and weakness over the past year or so. CT done by [facility] yr ago showed no regrowth of the meningioma.  Initial CT imaging confirms no acute abnormality. Suspect right brain infarct vs seizure.  Work up underway. On aspirin 81 mg orally every day prior to admission. Now on aspirin 325 mg orally every day for secondary stroke prevention. Patient with resultant left hemiparesis, left homonymous hemianopsia.  Hypertension Hyperlipidemia, LDL 75, on statin PTA, now on zocor, at goal LDL < 100   Hospital day # 1  TREATMENT/PLAN  Continue aspirin 325 mg orally every day for secondary stroke prevention.  Check CTA of head, neck. Cancel carotid dopplers  EEG  Add keppra 250 mg bid   F/u 2D  Annie Main, MSN, RN, ANVP-BC, ANP-BC, GNP-BC [facility] Pager: [phone] 08/26/2012 8:46 AM  I have personally obtained a history, examined the patient, evaluated imaging results, and formulated the assessment and plan of care. I agree with the above.    Delia Heady, MD Medical Director [facility] Pager: [phone] 08/26/2012 3:02 PM

## 2012-08-26 NOTE — Progress Notes (Signed)
Patient transported to CT. Episodic emesis in hallway while waiting for CT machine to become available. Patient now also complaining of new severe headache. Will give ibuprofen PRN as ordered on return to floor. Continuing to monitor.

## 2012-08-26 NOTE — Progress Notes (Signed)
  Echocardiogram 2D Echocardiogram has been performed.  [place]ian Co 08/26/2012, 12:26 PM

## 2012-08-27 DIAGNOSIS — D332 Benign neoplasm of brain, unspecified: Secondary | ICD-10-CM

## 2012-08-27 LAB — GLUCOSE, CAPILLARY
Glucose-Capillary: 93 mg/dL (ref 70–99)
Glucose-Capillary: 90 mg/dL (ref 70–99)

## 2012-08-27 LAB — URINE CULTURE: Colony Count: >=100000

## 2012-08-27 MED ORDER — FLEET ENEMA 7-19 GM/118ML RE ENEM
1.0000 | ENEMA | Freq: Every day | RECTAL | Status: DC | PRN
  Administered 2012-08-28: 13:00:00 via RECTAL
  Administered 2012-08-28: 1 via RECTAL
  Filled 2012-08-27: qty 1

## 2012-08-27 MED ORDER — LIDOCAINE 5 % EX PTCH
1.0000 | MEDICATED_PATCH | CUTANEOUS | Status: DC
  Administered 2012-08-27 – 2012-08-30 (×4): 1 via TRANSDERMAL
  Filled 2012-08-27 (×4): qty 1

## 2012-08-27 MED ORDER — SENNOSIDES-DOCUSATE SODIUM 8.6-50 MG PO TABS
1.0000 | ORAL_TABLET | Freq: Every evening | ORAL | Status: DC | PRN
  Administered 2012-08-27 – 2012-08-29 (×2): 1 via ORAL
  Filled 2012-08-27 (×2): qty 1

## 2012-08-27 NOTE — Progress Notes (Signed)
[facility] PROGRESS NOTE  Stacy Diaz ZOX:[id number] DOB: 10/12/1950 DOA: 08/25/2012 PCP: Evette Georges, MD  Assessment/Plan: 1. Paralysis of the LUE - has occurred before according to the patient but hasnt lasted this long ever before. Of note the patient stopped taking seizure medications (which she had been on for the past 14 years) in August of last year. Cannot obtain MRI/MRA as she has metal in her skull.  2d ECHO: Study Conclusions  Left ventricle: The cavity size was normal. Wall thickness was normal. Systolic function was normal. The estimated ejection fraction was in the range of 50% to 55%.  I question if this might not be a symptom of a partial seizure, with an underlying seizure disorder unmasked due to recently being taken off of seizure medications? EEG report pending, neurology to see patient in consultation- appreciate their help- CTA of brain ordered and is ok, ASA 2. Hyperlipidemia - continue home statin 3. HTN -  will hold off on starting home BP meds due to stroke concern. 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 5. UTI - treating with rocephin 1gm IV q24h- await culture- day #2  Code Status: full Family Communication: husband at bedside Disposition Plan:    Consultants:  neurology  Procedures:    Antibiotics:  Rocephin 1/9  HPI/Subjective: Feeling better today No SOB, no CP moving arm more   Objective: Filed Vitals:   08/27/12 0440 08/27/12 0500 08/27/12 0800 08/27/12 1200  BP: 128/74  123/77 112/74  Pulse: 132  93 107  Temp: 98.8 F (37.1 C)  98.6 F (37 C) 98.4 F (36.9 C)  TempSrc: Oral     Resp: 20  20 20   Height:      Weight:  73.165 kg (161 lb 4.8 oz)    SpO2: 96%  96% 98%    Intake/Output Summary (Last 24 hours) at 08/27/12 1541 Last data filed at 08/27/12 0700  Gross per 24 hour  Intake    120 ml  Output   1550 ml  Net  -1430 ml   Filed  Weights   08/26/12 0016 08/27/12 0500  Weight: 72.122 kg (159 lb) 73.165 kg (161 lb 4.8 oz)    Exam:   General:  A+Ox3, NAD  Cardiovascular: rrr  Respiratory: clear anterior  Abdomen: +BS,soft, NT  Paralysis of left arm  Data Reviewed: Basic Metabolic Panel:  Lab 08/25/12 8119 08/25/12 1716  NA 139 138  K 3.7 3.7  CL 103 98  CO2 -- 24  GLUCOSE 108* 110*  BUN 14 14  CREATININE 0.80 0.52  CALCIUM -- 9.8  MG -- --  PHOS -- --   Liver Function Tests:  Lab 08/25/12 1716  AST 18  ALT 13  ALKPHOS 46  BILITOT 0.4  PROT 7.2  ALBUMIN 4.3   No results found for this basename: LIPASE:5,AMYLASE:5 in the last 168 hours No results found for this basename: AMMONIA:5 in the last 168 hours CBC:  Lab 08/25/12 1750 08/25/12 1716  WBC -- 7.9  NEUTROABS -- 4.7  HGB 15.3* 14.8  HCT 45.0 43.1  MCV -- 91.5  PLT -- 233   Cardiac Enzymes:  Lab 08/25/12 1716  CKTOTAL --  CKMB --  CKMBINDEX --  TROPONINI <0.30   BNP (last 3 results) No results found for this basename: PROBNP:3 in the last 8760 hours CBG:  Lab 08/27/12 1148 08/25/12 1755  GLUCAP 93 98  Recent Results (from the past 240 hour(s))  URINE CULTURE     Status: Normal   Collection Time   08/25/12  7:09 PM      Component Value Range Status Comment   Specimen Description URINE, CATHETERIZED   Final    Special Requests NONE   Final    Culture  Setup Time 08/25/2012 20:50   Final    Colony Count >=100,000 COLONIES/ML   Final    Culture ESCHERICHIA COLI   Final    Report Status 08/27/2012 FINAL   Final    Organism ID, Bacteria ESCHERICHIA COLI   Final      Studies: Ct Angio Head W/cm &/or Wo Cm  08/26/2012  *RADIOLOGY REPORT*  Clinical Data:  62 year old female with suspected stroke. Extremity weakness.  No improvement in symptoms. 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  CT ANGIOGRAPHY HEAD AND NECK  Technique:  Multidetector CT imaging of the head and neck was performed using the standard  protocol during bolus administration of intravenous contrast.  Multiplanar CT image reconstructions including MIPs were obtained to evaluate the vascular anatomy. Carotid stenosis measurements (when applicable) are obtained utilizing NASCET criteria, using the distal internal carotid diameter as the denominator.  Contrast: 50mL OMNIPAQUE IOHEXOL 350 MG/ML SOLN  Comparison:  Head CTs without contrast 08/26/2012, 08/25/2012, 05/28/2011.  CTA NECK  Findings:  Mild dependent atelectasis.  No superior mediastinal lymphadenopathy.  Negative thyroid, larynx, pharynx, parapharyngeal spaces, retropharyngeal space, sublingual space, submandibular glands and parotid glands. Visualized orbit soft tissues are within normal limits.  Visualized paranasal sinuses and mastoids are clear.  Degenerative changes in the cervical spine.  Skull findings reported below, otherwise no other osseous abnormality identified. No cervical lymphadenopathy.  Vascular Findings: Mild arch atherosclerosis.  Three-vessel arch configuration.  No great vessel origin stenosis.  No right common carotid artery origin stenosis.  Calcified plaque at the lateral right ICA bulb.  No right ICA origin stenosis.  No cervical right ICA stenosis.  Normal right vertebral artery origin.  Moderately tortuous proximal right vertebral.  No right vertebral artery stenosis or other abnormality in the neck.  Left common carotid artery is patent without stenosis.  Mild soft and calcified plaque occurs at the left carotid bifurcation involving the posterior left ICA bulb.  No left ICA origin stenosis.  Mildly tortuous but otherwise negative cervical left ICA.  Dominant appearing left vertebral artery.  Normal origin.  Tortuous course of the proximal left vertebral, which extends to the tracheoesophageal groove prior to entering the left vertebral transverse foramen.  Otherwise negative left vertebral artery in the neck.  See intracranial findings below.   Review of the MIP  images confirms the above findings.  IMPRESSION: 1.  Mild arterial tortuosity and atherosclerosis in the neck.  No cervical carotid or vertebral artery stenosis or acute finding. 2.  Intracranial findings are below. 3.  No acute findings identified in the neck.  CTA HEAD  Findings:  Remote posterior superior craniectomy and associated changes to the calvarium are stable since 2006.  Associated cerclage wires and numerous vascular clips both at the craniectomy site and extending into the interhemispheric fissure. No acute osseous abnormality identified.  No acute orbit or scalp soft tissue abnormality.  Chronic superior hemisphere encephalomalacia and gliosis.  No ventriculomegaly. No midline shift, mass effect, or evidence of mass lesion.  No acute intracranial hemorrhage identified.  No evidence of cortically based acute infarction identified.  No abnormal enhancement identified.  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 Other visualized intracranial vascular structures appear patent.  Fairly codominant distal vertebral arteries.  The distal right vertebral artery is diminutive beyond the origin of PICA, but without significant stenosis.  Normal left PICA.  Patent vertebrobasilar junction.  Patent AICA origins.  No basilar stenosis.  SCA and PCA origins are within normal limits.  Posterior communicating arteries are diminutive or absent.  Bilateral PCA branches are within normal limits.  Antegrade flow in both ICA siphons.  Minimal ICA atherosclerosis with no stenosis.  Ophthalmic artery origins are within normal limits.  Carotid termini are patent.  MCA and ACA origins are within normal limits.  Anterior communicating artery is within normal limits. The right ACA A2 and distal branches appeared dominant, the left diminutive, but without focal stenosis or major branch occlusion identified.  MCA M1 segments are patent and within  normal limits.  Bilateral MCA bifurcations appear within normal limits.  No major MCA branch occlusion or stenosis is identified.   Review of the MIP images confirms the above findings.  IMPRESSION: 1.  Diminutive left ACA A2 and distal branches, but favor normal anatomic variation.  No focal ACA stenosis or major branch occlusion.  2.  Otherwise negative anterior circulation. 3.  Negative posterior circulation. 4.  Remote postoperative changes to the skull and brain. No acute intracranial abnormality.   Original Report Authenticated By: Erskine Speed, M.D.    Ct Head Wo Contrast  08/26/2012  *RADIOLOGY REPORT*  Clinical Data: Concern for acute stroke versus seizure.  Evaluate for interval change.  CT HEAD WITHOUT CONTRAST  Technique:  Contiguous axial images were obtained from the base of the skull through the vertex without contrast.  Comparison:  08/25/2012  Findings: Motion degraded exam.  No obvious interval change in the bihemispheric encephalomalacia related previous surgery.  No demonstrable areas of cortical infarction or intracranial hemorrhage.  IMPRESSION: Within limits of detection on this motion degraded exam, no interval change from earlier examination.   Original Report Authenticated By: Davonna Belling, M.D.    Ct Head Wo Contrast  08/25/2012  *RADIOLOGY REPORT*  Clinical Data: Weakness of the extremities  CT HEAD WITHOUT CONTRAST  Technique:  Contiguous axial images were obtained from the base of the skull through the vertex without contrast.  Comparison: None.  Findings: Sequelae of vertex craniotomy  identified with multiple cerclage wires and surgical clips in and around the falx cerebri Previous cranioplasty in this region.  Clear paranasal sinuses and orbits.   Encephalomalacia of the right and left parasagittal frontal parietal cortex is stable.  No definite acute infarct or hemorrhage.  No recurrent mass lesion.  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  IMPRESSION: Stable exam.  No acute intracranial findings.  Postsurgical change related to what was reportedly a benign neoplasm, likely previous meningioma removal.   Original Report Authenticated By: Davonna Belling, M.D.    Ct Angio Neck W/cm &/or Wo/cm  08/26/2012  *RADIOLOGY REPORT*  Clinical Data:  62 year old female with suspected stroke. Extremity weakness.  No improvement in symptoms. 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  CT ANGIOGRAPHY HEAD AND NECK  Technique:  Multidetector CT imaging of the head and neck was performed using the standard protocol during bolus administration of intravenous contrast.  Multiplanar CT image reconstructions including MIPs were obtained to evaluate the vascular anatomy. Carotid stenosis measurements (when applicable) are obtained utilizing NASCET  criteria, using the distal internal carotid diameter as the denominator.  Contrast: 50mL OMNIPAQUE IOHEXOL 350 MG/ML SOLN  Comparison:  Head CTs without contrast 08/26/2012, 08/25/2012, 05/28/2011.  CTA NECK  Findings:  Mild dependent atelectasis.  No superior mediastinal lymphadenopathy.  Negative thyroid, larynx, pharynx, parapharyngeal spaces, retropharyngeal space, sublingual space, submandibular glands and parotid glands. Visualized orbit soft tissues are within normal limits.  Visualized paranasal sinuses and mastoids are clear.  Degenerative changes in the cervical spine.  Skull findings reported below, otherwise no other osseous abnormality identified. No cervical lymphadenopathy.  Vascular Findings: Mild arch atherosclerosis.  Three-vessel arch configuration.  No great vessel origin stenosis.  No right common carotid artery origin stenosis.  Calcified plaque at the lateral right ICA bulb.  No right ICA origin stenosis.  No cervical right ICA stenosis.  Normal right vertebral artery origin.  Moderately tortuous proximal right vertebral.  No right vertebral artery stenosis or other  abnormality in the neck.  Left common carotid artery is patent without stenosis.  Mild soft and calcified plaque occurs at the left carotid bifurcation involving the posterior left ICA bulb.  No left ICA origin stenosis.  Mildly tortuous but otherwise negative cervical left ICA.  Dominant appearing left vertebral artery.  Normal origin.  Tortuous course of the proximal left vertebral, which extends to the tracheoesophageal groove prior to entering the left vertebral transverse foramen.  Otherwise negative left vertebral artery in the neck.  See intracranial findings below.   Review of the MIP images confirms the above findings.  IMPRESSION: 1.  Mild arterial tortuosity and atherosclerosis in the neck.  No cervical carotid or vertebral artery stenosis or acute finding. 2.  Intracranial findings are below. 3.  No acute findings identified in the neck.  CTA HEAD  Findings:  Remote posterior superior craniectomy and associated changes to the calvarium are stable since 2006.  Associated cerclage wires and numerous vascular clips both at the craniectomy site and extending into the interhemispheric fissure. No acute osseous abnormality identified.  No acute orbit or scalp soft tissue abnormality.  Chronic superior hemisphere encephalomalacia and gliosis.  No ventriculomegaly. No midline shift, mass effect, or evidence of mass lesion.  No acute intracranial hemorrhage identified.  No evidence of cortically based acute infarction identified.  No abnormal enhancement identified.  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 Other visualized intracranial vascular structures appear patent.  Fairly codominant distal vertebral arteries.  The distal right vertebral artery is diminutive beyond the origin of PICA, but without significant stenosis.  Normal left PICA.  Patent vertebrobasilar junction.  Patent AICA origins.  No basilar stenosis.  SCA and  PCA origins are within normal limits.  Posterior communicating arteries are diminutive or absent.  Bilateral PCA branches are within normal limits.  Antegrade flow in both ICA siphons.  Minimal ICA atherosclerosis with no stenosis.  Ophthalmic artery origins are within normal limits.  Carotid termini are patent.  MCA and ACA origins are within normal limits.  Anterior communicating artery is within normal limits. The right ACA A2 and distal branches appeared dominant, the left diminutive, but without focal stenosis or major branch occlusion identified.  MCA M1 segments are patent and within normal limits.  Bilateral MCA bifurcations appear within normal limits.  No major MCA branch occlusion or stenosis is identified.   Review of the MIP images confirms the above findings.  IMPRESSION: 1.  Diminutive left ACA A2 and distal branches, but  favor normal anatomic variation.  No focal ACA stenosis or major branch occlusion.  2.  Otherwise negative anterior circulation. 3.  Negative posterior circulation. 4.  Remote postoperative changes to the skull and brain. No acute intracranial abnormality.   Original Report Authenticated By: Erskine Speed, M.D.     Scheduled Meds:    . aspirin EC  325 mg Oral Daily  . baclofen  20 mg Oral QID  . cefTRIAXone (ROCEPHIN)  IV  1 g Intravenous QHS  . diazepam  5 mg Oral QHS  . heparin  5,000 Units Subcutaneous Q8H  . levETIRAcetam  250 mg Oral BID  . lidocaine  1 patch Transdermal Q24H  . LORazepam  1 mg Intravenous Once  . simvastatin  20 mg Oral QHS   Continuous Infusions:   Principal Problem:  *Paralysis of upper limb Active Problems:  HYPERLIPIDEMIA  HYPERTENSION  INJURY, NERVE, PELVIS/LWR LIMB NOS  Seizures  UTI (lower urinary tract infection)    Time spent: 35    [facility], Stacy Diaz  [facility] Pager [phone]. If 8PM-8AM, please contact night-coverage at [email or web address], password [facility] 08/27/2012, 3:41 PM  LOS: 2 days

## 2012-08-27 NOTE — Progress Notes (Signed)
Stroke Team Progress Note  HISTORY Stacy Diaz is a 62 y.o. female with a history of previous meningioma resection with subsequent spastic paraparesis who developed left arm weakness this morning 08/25/2012. She has not had any tingling, positive visual symptoms or other positive symptoms. She has had multiple episodes in the past similar to her current one, but this one has been persistent. She did have deficits on the left after her initial surgery, but they imprved. Patient was not a TPA candidate secondary to delay in arrival. She was admitted for further evaluation and treatment.  SUBJECTIVE Her husband is at the bedside. He is a good historian.  Overall she feels her condition is unchanged. She still has left hemiparesis.  OBJECTIVE Most recent Vital Signs: Filed Vitals:   08/27/12 0000 08/27/12 0440 08/27/12 0500 08/27/12 0800  BP: 120/63 128/74  123/77  Pulse: 95 132  93  Temp: 97.8 F (36.6 C) 98.8 F (37.1 C)  98.6 F (37 C)  TempSrc: Oral Oral    Resp: 16 20  20   Height:      Weight:   73.165 kg (161 lb 4.8 oz)   SpO2: 95% 96%  96%   CBG (last 3)   Basename 08/25/12 1755  GLUCAP 98    IV Fluid Intake:     MEDICATIONS     . aspirin EC  325 mg Oral Daily  . baclofen  20 mg Oral QID  . cefTRIAXone (ROCEPHIN)  IV  1 g Intravenous QHS  . diazepam  5 mg Oral QHS  . heparin  5,000 Units Subcutaneous Q8H  . levETIRAcetam  250 mg Oral BID  . LORazepam  1 mg Intravenous Once  . simvastatin  20 mg Oral QHS   PRN:  HYDROcodone-acetaminophen, ibuprofen, ondansetron  Diet:  Cardiac thin liquids Activity:  Bedrest, OOB with assistance DVT Prophylaxis:  Heparin 5000 units sq tid  CLINICALLY SIGNIFICANT STUDIES Basic Metabolic Panel:   Lab 08/25/12 1750 08/25/12 1716  NA 139 138  K 3.7 3.7  CL 103 98  CO2 -- 24  GLUCOSE 108* 110*  BUN 14 14  CREATININE 0.80 0.52  CALCIUM -- 9.8  MG -- --  PHOS -- --   Liver Function Tests:   Lab 08/25/12 1716  AST 18    ALT 13  ALKPHOS 46  BILITOT 0.4  PROT 7.2  ALBUMIN 4.3   CBC:   Lab 08/25/12 1750 08/25/12 1716  WBC -- 7.9  NEUTROABS -- 4.7  HGB 15.3* 14.8  HCT 45.0 43.1  MCV -- 91.5  PLT -- 233   Coagulation:   Lab 08/25/12 1716  LABPROT 13.0  INR 0.99   Cardiac Enzymes:   Lab 08/25/12 1716  CKTOTAL --  CKMB --  CKMBINDEX --  TROPONINI <0.30   Urinalysis:   Lab 08/25/12 1909  COLORURINE YELLOW  LABSPEC 1.010  PHURINE 6.0  GLUCOSEU NEGATIVE  HGBUR SMALL*  BILIRUBINUR NEGATIVE  KETONESUR 15*  PROTEINUR NEGATIVE  UROBILINOGEN 0.2  NITRITE POSITIVE*  LEUKOCYTESUR MODERATE*   Lipid Panel    Component Value Date/Time   CHOL 164 08/26/2012 0749   TRIG 69 08/26/2012 0749   HDL 75 08/26/2012 0749   CHOLHDL 2.2 08/26/2012 0749   VLDL 14 08/26/2012 0749   LDLCALC 75 08/26/2012 0749   HgbA1C  Lab Results  Component Value Date   HGBA1C 5.6 08/26/2012    Urine Drug Screen:     Component Value Date/Time   LABOPIA POSITIVE* 08/25/2012 1909  COCAINSCRNUR NONE DETECTED 08/25/2012 1909   LABBENZ POSITIVE* 08/25/2012 1909   AMPHETMU NONE DETECTED 08/25/2012 1909   THCU NONE DETECTED 08/25/2012 1909   LABBARB NONE DETECTED 08/25/2012 1909    Alcohol Level:   Lab 08/25/12 1716  ETH <11   CT of the brain   08/26/2012  Within limits of detection on this motion degraded exam, no interval change from earlier examination.   08/25/2012  Stable exam.  No acute intracranial findings.  Postsurgical change related to what was reportedly a benign neoplasm, likely previous meningioma removal.  CTA H/N: No significant hemodynamic stenosis.  MRI of the brain    MRA of the brain    2D Echocardiogram  EF 50-55%  Carotid Doppler    CXR    EKG  normal sinus rhythm.   Therapy Recommendations   Physical Exam   Pleasant middle-aged Caucasian lady currently not in distress.Frontal alopecia at craniotomy site.Awake alert. Afebrile. Head is nontraumatic. Neck is supple without bruit. Hearing is  normal. Cardiac exam no murmur or gallop. Lungs are clear to auscultation. Distal pulses are well felt.   Neurological Exam :Awake alert oriented x e. Speech and language are normal. Eye movements full range. Dense left homonymous hemianopsia. Left lower face weakness. Fundi not visualized. Mild left neglect. Dense left hemiplegia with 0/5 LUE and 1/5 LLE.Good RUE strength. RLE weak 1/5. Decrease sensation left hemibody. Plantars not elicitible. ASSESSMENT Stacy Diaz is a 62 y.o. female presenting with left arm weakness. Husband reports she has had greater than a dozen episodes of left arm numbness and weakness over the past year or so. CT done by [facility] 1 yr ago showed no regrowth of the meningioma.  Initial CT imaging confirms no acute abnormality. Suspect right brain infarct vs seizure.  Work up underway. On aspirin 81 mg orally every day prior to admission. Now on aspirin 325 mg orally every day for secondary stroke prevention. Patient with resultant left hemiparesis, left homonymous hemianopsia.  Hypertension Hyperlipidemia, LDL 75, on statin PTA, now on zocor, at goal LDL < 100   Hospital day # 2  TREATMENT/PLAN  Continue aspirin 325 mg orally every day for secondary stroke prevention.  EEG official results  Continue keppra 250 mg bid   F/u 2D   I have personally obtained a history, examined the patient, evaluated imaging results, and formulated the assessment and plan of care. I agree with the above.    Pauletta Browns

## 2012-08-27 NOTE — Progress Notes (Signed)
Patient was trying to have bowel movement heart rate went up 140's sinus tachycardia .Asymptomatic denies chest pain and shortness of breath blood pressure 128/74 .Heart gradually went back down low 100's sinus tachycardia .Will continue to monitor patient.

## 2012-08-28 DIAGNOSIS — N390 Urinary tract infection, site not specified: Secondary | ICD-10-CM

## 2012-08-28 MED ORDER — LORAZEPAM 0.5 MG PO TABS
0.5000 mg | ORAL_TABLET | Freq: Once | ORAL | Status: AC
  Administered 2012-08-28: 0.5 mg via ORAL
  Filled 2012-08-28: qty 1

## 2012-08-28 NOTE — Progress Notes (Signed)
Stroke Team Progress Note  HISTORY Stacy Diaz is a 62 y.o. female with a history of previous meningioma resection with subsequent spastic paraparesis who developed left arm weakness this morning 08/25/2012. She has not had any tingling, positive visual symptoms or other positive symptoms. She has had multiple episodes in the past similar to her current one, but this one has been persistent. She did have deficits on the left after her initial surgery, but they imprved. Patient was not a TPA candidate secondary to delay in arrival. She was admitted for further evaluation and treatment. Started on Keppra. Mental status significantly improved today.   SUBJECTIVE Her husband is at the bedside. He is a good historian.  Overall she feels her condition is unchanged. She still has left hemiparesis.  OBJECTIVE Most recent Vital Signs: Filed Vitals:   08/27/12 1600 08/27/12 2100 08/28/12 0020 08/28/12 0500  BP: 143/83 139/82 125/82 149/87  Pulse: 94 96 116 84  Temp: 98.2 F (36.8 C) 99.2 F (37.3 C) 98.7 F (37.1 C) 97.9 F (36.6 C)  TempSrc:  Oral  Oral  Resp: 18 18  18   Height:      Weight:    73.2 kg (161 lb 6 oz)  SpO2: 100% 95% 95% 96%   CBG (last 3)   Basename 08/27/12 1658 08/27/12 1148 08/25/12 1755  GLUCAP 90 93 98    IV Fluid Intake:     MEDICATIONS     . aspirin EC  325 mg Oral Daily  . baclofen  20 mg Oral QID  . cefTRIAXone (ROCEPHIN)  IV  1 g Intravenous QHS  . diazepam  5 mg Oral QHS  . heparin  5,000 Units Subcutaneous Q8H  . levETIRAcetam  250 mg Oral BID  . lidocaine  1 patch Transdermal Q24H  . LORazepam  1 mg Intravenous Once  . simvastatin  20 mg Oral QHS   PRN:  HYDROcodone-acetaminophen, ibuprofen, ondansetron, senna-docusate, sodium phosphate  Diet:  Cardiac thin liquids Activity:  Bedrest, OOB with assistance DVT Prophylaxis:  Heparin 5000 units sq tid  CLINICALLY SIGNIFICANT STUDIES Basic Metabolic Panel:   Lab 08/25/12 1750 08/25/12 1716  NA  139 138  K 3.7 3.7  CL 103 98  CO2 -- 24  GLUCOSE 108* 110*  BUN 14 14  CREATININE 0.80 0.52  CALCIUM -- 9.8  MG -- --  PHOS -- --   Liver Function Tests:   Lab 08/25/12 1716  AST 18  ALT 13  ALKPHOS 46  BILITOT 0.4  PROT 7.2  ALBUMIN 4.3   CBC:   Lab 08/25/12 1750 08/25/12 1716  WBC -- 7.9  NEUTROABS -- 4.7  HGB 15.3* 14.8  HCT 45.0 43.1  MCV -- 91.5  PLT -- 233   Coagulation:   Lab 08/25/12 1716  LABPROT 13.0  INR 0.99   Cardiac Enzymes:   Lab 08/25/12 1716  CKTOTAL --  CKMB --  CKMBINDEX --  TROPONINI <0.30   Urinalysis:   Lab 08/25/12 1909  COLORURINE YELLOW  LABSPEC 1.010  PHURINE 6.0  GLUCOSEU NEGATIVE  HGBUR SMALL*  BILIRUBINUR NEGATIVE  KETONESUR 15*  PROTEINUR NEGATIVE  UROBILINOGEN 0.2  NITRITE POSITIVE*  LEUKOCYTESUR MODERATE*   Lipid Panel    Component Value Date/Time   CHOL 164 08/26/2012 0749   TRIG 69 08/26/2012 0749   HDL 75 08/26/2012 0749   CHOLHDL 2.2 08/26/2012 0749   VLDL 14 08/26/2012 0749   LDLCALC 75 08/26/2012 0749   HgbA1C  Lab Results  Component Value Date   HGBA1C 5.6 08/26/2012    Urine Drug Screen:     Component Value Date/Time   LABOPIA POSITIVE* 08/25/2012 1909   COCAINSCRNUR NONE DETECTED 08/25/2012 1909   LABBENZ POSITIVE* 08/25/2012 1909   AMPHETMU NONE DETECTED 08/25/2012 1909   THCU NONE DETECTED 08/25/2012 1909   LABBARB NONE DETECTED 08/25/2012 1909    Alcohol Level:   Lab 08/25/12 1716  ETH <11   CT of the brain   08/26/2012  Within limits of detection on this motion degraded exam, no interval change from earlier examination.   08/25/2012  Stable exam.  No acute intracranial findings.  Postsurgical change related to what was reportedly a benign neoplasm, likely previous meningioma removal.  CTA H/N: No significant hemodynamic stenosis.  MRI of the brain    MRA of the brain    2D Echocardiogram  EF 50-55%  Carotid Doppler    CXR    EKG  normal sinus rhythm.   Therapy Recommendations   Physical  Exam   Pleasant middle-aged Caucasian lady currently not in distress.Frontal alopecia at craniotomy site.Awake alert. Afebrile. Head is nontraumatic. Neck is supple without bruit. Hearing is normal. Cardiac exam no murmur or gallop. Lungs are clear to auscultation. Distal pulses are well felt.   Neurological Exam :Awake alert oriented x e. Speech and language are normal. Eye movements full range. Dense left homonymous hemianopsia. Left lower face weakness. Fundi not visualized. Mild left neglect. Dense left hemiplegia with 0/5 LUE and 1/5 LLE.Good RUE strength. RLE weak 1/5. Decrease sensation left hemibody. Plantars not elicitible. ASSESSMENT [name] is a 62 y.o. female presenting with left arm weakness. Husband reports she has had greater than a dozen episodes of left arm numbness and weakness over the past year or so. CT done by [facility] 1 yr ago showed no regrowth of the meningioma.  Initial CT imaging confirms no acute abnormality.   Work up underway. On aspirin 81 mg orally every day prior to admission. Now on aspirin 325 mg orally every day for secondary stroke prevention. Patient with resultant left hemiparesis, left homonymous hemianopsia. Mental status improvement today and states she feels better.   Hypertension Hyperlipidemia, LDL 75, on statin PTA, now on zocor, at goal LDL < 100   Hospital day # 3  TREATMENT/PLAN  Continue aspirin 325 mg orally every day for secondary stroke prevention.  EEG official results  Continue keppra 250 mg bid   Will sign off call with questions.      I have personally obtained a history, examined the patient, evaluated imaging results, and formulated the assessment and plan of care. I agree with the above.    Pauletta Browns

## 2012-08-28 NOTE — Progress Notes (Signed)
[facility] PROGRESS NOTE  Stacy Diaz GNF:[id number] DOB: 1951-02-12 DOA: 08/25/2012 PCP: Evette Georges, MD  Assessment/Plan: 1. Paralysis of the LUE - has occurred before according to the patient but hasnt lasted this long ever before. Of note the patient stopped taking seizure medications (which she had been on for the past 14 years) in August of last year. Cannot obtain MRI/MRA as she has metal in her skull.  2d ECHO: Study Conclusions  Left ventricle: The cavity size was normal. Wall thickness was normal. Systolic function was normal. The estimated ejection fraction was in the range of 50% to 55%.  I question if this might not be a symptom of a partial seizure, with an underlying seizure disorder unmasked due to recently being taken off of seizure medications? EEG report pending, neurology to see patient in consultation- appreciate their help- CTA of brain ordered and is ok, ASA 2. Hyperlipidemia - continue home statin 3. HTN -  will hold off on starting home BP meds due to stroke concern. 4. 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 UTI - treating with rocephin 1gm IV q24h- ecoli  Code Status: full Family Communication: husband at bedside Disposition Plan: home/CIR once ok with neurology   Consultants:  neurology  Procedures:    Antibiotics:  Rocephin 1/9  HPI/Subjective: Feeling about the same, no BM   Objective: Filed Vitals:   08/27/12 1600 08/27/12 2100 08/28/12 0020 08/28/12 0500  BP: 143/83 139/82 125/82 149/87  Pulse: 94 96 116 84  Temp: 98.2 F (36.8 C) 99.2 F (37.3 C) 98.7 F (37.1 C) 97.9 F (36.6 C)  TempSrc:  Oral  Oral  Resp: 18 18  18   Height:      Weight:    73.2 kg (161 lb 6 oz)  SpO2: 100% 95% 95% 96%    Intake/Output Summary (Last 24 hours) at 08/28/12 1210 Last data filed at 08/28/12 0906  Gross per 24 hour  Intake    650 ml  Output       0 ml  Net    650 ml   Filed Weights   08/26/12 0016 08/27/12 0500 08/28/12 0500  Weight: 72.122 kg (159 lb) 73.165 kg (161 lb 4.8 oz) 73.2 kg (161 lb 6 oz)    Exam:   General:  A+Ox3, NAD  Cardiovascular: rrr  Respiratory: clear anterior  Abdomen: +BS,soft, NT  Paralysis of left arm  Data Reviewed: Basic Metabolic Panel:  Lab 08/25/12 8469 08/25/12 1716  NA 139 138  K 3.7 3.7  CL 103 98  CO2 -- 24  GLUCOSE 108* 110*  BUN 14 14  CREATININE 0.80 0.52  CALCIUM -- 9.8  MG -- --  PHOS -- --   Liver Function Tests:  Lab 08/25/12 1716  AST 18  ALT 13  ALKPHOS 46  BILITOT 0.4  PROT 7.2  ALBUMIN 4.3   No results found for this basename: LIPASE:5,AMYLASE:5 in the last 168 hours No results found for this basename: AMMONIA:5 in the last 168 hours CBC:  Lab 08/25/12 1750 08/25/12 1716  WBC -- 7.9  NEUTROABS -- 4.7  HGB 15.3* 14.8  HCT 45.0 43.1  MCV -- 91.5  PLT -- 233   Cardiac Enzymes:  Lab 08/25/12 1716  CKTOTAL --  CKMB --  CKMBINDEX --  TROPONINI <0.30   BNP (last 3 results) No results found for this basename: PROBNP:3 in  the last 8760 hours CBG:  Lab 08/27/12 1658 08/27/12 1148 08/25/12 1755  GLUCAP 90 93 98    Recent Results (from the past 240 hour(s))  URINE CULTURE     Status: Normal   Collection Time   08/25/12  7:09 PM      Component Value Range Status Comment   Specimen Description URINE, CATHETERIZED   Final    Special Requests NONE   Final    Culture  Setup Time 08/25/2012 20:50   Final    Colony Count >=100,000 COLONIES/ML   Final    Culture ESCHERICHIA COLI   Final    Report Status 08/27/2012 FINAL   Final    Organism ID, Bacteria ESCHERICHIA COLI   Final      Studies: Ct Angio Head W/cm &/or Wo Cm  08/26/2012  *RADIOLOGY REPORT*  Clinical Data:  62 year old female with suspected stroke. Extremity weakness.  No improvement in symptoms. 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  CT ANGIOGRAPHY HEAD AND NECK   Technique:  Multidetector CT imaging of the head and neck was performed using the standard protocol during bolus administration of intravenous contrast.  Multiplanar CT image reconstructions including MIPs were obtained to evaluate the vascular anatomy. Carotid stenosis measurements (when applicable) are obtained utilizing NASCET criteria, using the distal internal carotid diameter as the denominator.  Contrast: 50mL OMNIPAQUE IOHEXOL 350 MG/ML SOLN  Comparison:  Head CTs without contrast 08/26/2012, 08/25/2012, 05/28/2011.  CTA NECK  Findings:  Mild dependent atelectasis.  No superior mediastinal lymphadenopathy.  Negative thyroid, larynx, pharynx, parapharyngeal spaces, retropharyngeal space, sublingual space, submandibular glands and parotid glands. Visualized orbit soft tissues are within normal limits.  Visualized paranasal sinuses and mastoids are clear.  Degenerative changes in the cervical spine.  Skull findings reported below, otherwise no other osseous abnormality identified. No cervical lymphadenopathy.  Vascular Findings: Mild arch atherosclerosis.  Three-vessel arch configuration.  No great vessel origin stenosis.  No right common carotid artery origin stenosis.  Calcified plaque at the lateral right ICA bulb.  No right ICA origin stenosis.  No cervical right ICA stenosis.  Normal right vertebral artery origin.  Moderately tortuous proximal right vertebral.  No right vertebral artery stenosis or other abnormality in the neck.  Left common carotid artery is patent without stenosis.  Mild soft and calcified plaque occurs at the left carotid bifurcation involving the posterior left ICA bulb.  No left ICA origin stenosis.  Mildly tortuous but otherwise negative cervical left ICA.  Dominant appearing left vertebral artery.  Normal origin.  Tortuous course of the proximal left vertebral, which extends to the tracheoesophageal groove prior to entering the left vertebral transverse foramen.  Otherwise negative  left vertebral artery in the neck.  See intracranial findings below.   Review of the MIP images confirms the above findings.  IMPRESSION: 1.  Mild arterial tortuosity and atherosclerosis in the neck.  No cervical carotid or vertebral artery stenosis or acute finding. 2.  Intracranial findings are below. 3.  No acute findings identified in the neck.  CTA HEAD  Findings:  Remote posterior superior craniectomy and associated changes to the calvarium are stable since 2006.  Associated cerclage wires and numerous vascular clips both at the craniectomy site and extending into the interhemispheric fissure. No acute osseous abnormality identified.  No acute orbit or scalp soft tissue abnormality.  Chronic superior hemisphere encephalomalacia and gliosis.  No ventriculomegaly. No midline shift, mass effect, or evidence of mass lesion.  No acute intracranial  hemorrhage identified.  No evidence of cortically based acute infarction identified.  No abnormal enhancement identified.  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 Other visualized intracranial vascular structures appear patent.  Fairly codominant distal vertebral arteries.  The distal right vertebral artery is diminutive beyond the origin of PICA, but without significant stenosis.  Normal left PICA.  Patent vertebrobasilar junction.  Patent AICA origins.  No basilar stenosis.  SCA and PCA origins are within normal limits.  Posterior communicating arteries are diminutive or absent.  Bilateral PCA branches are within normal limits.  Antegrade flow in both ICA siphons.  Minimal ICA atherosclerosis with no stenosis.  Ophthalmic artery origins are within normal limits.  Carotid termini are patent.  MCA and ACA origins are within normal limits.  Anterior communicating artery is within normal limits. The right ACA A2 and distal branches appeared dominant, the left diminutive, but without focal  stenosis or major branch occlusion identified.  MCA M1 segments are patent and within normal limits.  Bilateral MCA bifurcations appear within normal limits.  No major MCA branch occlusion or stenosis is identified.   Review of the MIP images confirms the above findings.  IMPRESSION: 1.  Diminutive left ACA A2 and distal branches, but favor normal anatomic variation.  No focal ACA stenosis or major branch occlusion.  2.  Otherwise negative anterior circulation. 3.  Negative posterior circulation. 4.  Remote postoperative changes to the skull and brain. No acute intracranial abnormality.   Original Report Authenticated By: Erskine Speed, M.D.    Ct Angio Neck W/cm &/or Wo/cm  08/26/2012  *RADIOLOGY REPORT*  Clinical Data:  62 year old female with suspected stroke. Extremity weakness.  No improvement in symptoms. 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  CT ANGIOGRAPHY HEAD AND NECK  Technique:  Multidetector CT imaging of the head and neck was performed using the standard protocol during bolus administration of intravenous contrast.  Multiplanar CT image reconstructions including MIPs were obtained to evaluate the vascular anatomy. Carotid stenosis measurements (when applicable) are obtained utilizing NASCET criteria, using the distal internal carotid diameter as the denominator.  Contrast: 50mL OMNIPAQUE IOHEXOL 350 MG/ML SOLN  Comparison:  Head CTs without contrast 08/26/2012, 08/25/2012, 05/28/2011.  CTA NECK  Findings:  Mild dependent atelectasis.  No superior mediastinal lymphadenopathy.  Negative thyroid, larynx, pharynx, parapharyngeal spaces, retropharyngeal space, sublingual space, submandibular glands and parotid glands. Visualized orbit soft tissues are within normal limits.  Visualized paranasal sinuses and mastoids are clear.  Degenerative changes in the cervical spine.  Skull findings reported below, otherwise no other osseous abnormality identified. No cervical lymphadenopathy.  Vascular  Findings: Mild arch atherosclerosis.  Three-vessel arch configuration.  No great vessel origin stenosis.  No right common carotid artery origin stenosis.  Calcified plaque at the lateral right ICA bulb.  No right ICA origin stenosis.  No cervical right ICA stenosis.  Normal right vertebral artery origin.  Moderately tortuous proximal right vertebral.  No right vertebral artery stenosis or other abnormality in the neck.  Left common carotid artery is patent without stenosis.  Mild soft and calcified plaque occurs at the left carotid bifurcation involving the posterior left ICA bulb.  No left ICA origin stenosis.  Mildly tortuous but otherwise negative cervical left ICA.  Dominant appearing left vertebral artery.  Normal origin.  Tortuous course of the proximal left vertebral, which extends to the tracheoesophageal groove prior to entering the left vertebral transverse foramen.  Otherwise negative left vertebral artery in the neck.  See intracranial findings below.   Review of the MIP images confirms the above findings.  IMPRESSION: 1.  Mild arterial tortuosity and atherosclerosis in the neck.  No cervical carotid or vertebral artery stenosis or acute finding. 2.  Intracranial findings are below. 3.  No acute findings identified in the neck.  CTA HEAD  Findings:  Remote posterior superior craniectomy and associated changes to the calvarium are stable since 2006.  Associated cerclage wires and numerous vascular clips both at the craniectomy site and extending into the interhemispheric fissure. No acute osseous abnormality identified.  No acute orbit or scalp soft tissue abnormality.  Chronic superior hemisphere encephalomalacia and gliosis.  No ventriculomegaly. No midline shift, mass effect, or evidence of mass lesion.  No acute intracranial hemorrhage identified.  No evidence of cortically based acute infarction identified.  No abnormal enhancement identified.  Vascular Findings: The superior sagittal sinus probably  is chronically thrombosed or resected related to the previous surgery. Other visualized intracranial vascular structures appear patent.  Fairly codominant distal vertebral arteries.  The distal right vertebral artery is diminutive beyond the origin of PICA, but without significant stenosis.  Normal left PICA.  Patent vertebrobasilar junction.  Patent AICA origins.  No basilar stenosis.  SCA and PCA origins are within normal limits.  Posterior communicating arteries are diminutive or absent.  Bilateral PCA branches are within normal limits.  Antegrade flow in both ICA siphons.  Minimal ICA atherosclerosis with no stenosis.  Ophthalmic artery origins are within normal limits.  Carotid termini are patent.  MCA and ACA origins are within normal limits.  Anterior communicating artery is within normal limits. The right ACA A2 and distal branches appeared dominant, the left diminutive, but without focal stenosis or major branch occlusion identified.  MCA M1 segments are patent and within normal limits.  Bilateral MCA bifurcations appear within normal limits.  No major MCA branch occlusion or stenosis is identified.   Review of the MIP images confirms the above findings.  IMPRESSION: 1.  Diminutive left ACA A2 and distal branches, but favor normal anatomic variation.  No focal ACA stenosis or major branch occlusion.  2.  Otherwise negative anterior circulation. 3.  Negative posterior circulation. 4.  Remote postoperative changes to the skull and brain. No acute intracranial abnormality.   Original Report Authenticated By: Erskine Speed, M.D.     Scheduled Meds:    . aspirin EC  325 mg Oral Daily  . baclofen  20 mg Oral QID  . cefTRIAXone (ROCEPHIN)  IV  1 g Intravenous QHS  . diazepam  5 mg Oral QHS  . heparin  5,000 Units Subcutaneous Q8H  . levETIRAcetam  250 mg Oral BID  . lidocaine  1 patch Transdermal Q24H  . LORazepam  1 mg Intravenous Once  . simvastatin  20 mg Oral QHS   Continuous Infusions:    Principal Problem:  *Paralysis of upper limb Active Problems:  HYPERLIPIDEMIA  HYPERTENSION  INJURY, NERVE, PELVIS/LWR LIMB NOS  Seizures  UTI (lower urinary tract infection)    Time spent: 35    [facility], Stacy Diaz  [facility] Pager [phone]. If 8PM-8AM, please contact night-coverage at [email or web address], password [facility] 08/28/2012, 12:10 PM  LOS: 3 days

## 2012-08-29 DIAGNOSIS — M7989 Other specified soft tissue disorders: Secondary | ICD-10-CM

## 2012-08-29 DIAGNOSIS — M79609 Pain in unspecified limb: Secondary | ICD-10-CM

## 2012-08-29 MED ORDER — LORAZEPAM 2 MG/ML IJ SOLN
0.5000 mg | Freq: Once | INTRAMUSCULAR | Status: AC
  Administered 2012-08-29: 0.5 mg via INTRAVENOUS
  Filled 2012-08-29: qty 1

## 2012-08-29 NOTE — Clinical Social Work Placement (Addendum)
    Clinical Social Work Department CLINICAL SOCIAL WORK PLACEMENT NOTE 08/29/2012  Patient:  DAYLE, SHERPA  Account Number:  [id number] Admit date:  08/25/2012  Clinical Social Worker:  Margaree Mackintosh  Date/time:  08/29/2012 12:00 M  Clinical Social Work is seeking post-discharge placement for this patient at the following level of care:   SKILLED NURSING   (*CSW will update this form in Epic as items are completed)   08/29/2012  Patient/family provided with [facility] Department of Clinical Social Work's list of facilities offering this level of care within the geographic area requested by the patient (or if unable, by the patient's family).  08/29/2012  Patient/family informed of their freedom to choose among providers that offer the needed level of care, that participate in Medicare, Medicaid or managed care program needed by the patient, have an available bed and are willing to accept the patient.  08/29/2012  Patient/family informed of [facility]' ownership interest in [facility], as well as of the fact that they are under no obligation to receive care at this facility.  PASARR submitted to EDS on 08/29/2012 PASARR number received from [employer] on 08/29/2012  FL2 transmitted to all facilities in geographic area requested by pt/family on  08/29/2012 FL2 transmitted to all facilities within larger geographic area on   Patient informed that his/her managed care company has contracts with or will negotiate with  certain facilities, including the following:  08/29/12   Patient/family informed of bed offers received:  08/30/12 Patient chooses bed at [facility] Physician recommends and patient chooses bed at  [facility].  Patient to be transferred to [facility] on  08/30/2012 Patient to be transferred to facility by [facility].  The following physician request were entered in Epic:   Additional Comments:

## 2012-08-29 NOTE — Consult Note (Signed)
Physical Medicine and Rehabilitation Consult Reason for Consult: Previous meningioma resection with subsequent spastic paraparesis Referring Physician: [facility]   HPI: Stacy Diaz is a 62 y.o. right-handed female with history of previous meningioma resection 14 years ago with subsequent spastic paraparesis and received inpatient rehabilitation services. Patient essentially wheelchair-bound prior to admission but was able to stand pivot transfer. Admitted 08/25/2012 with new onset of spastic paralysis of left upper extremity and left neglect. Cranial CT scan initially showing no acute intracranial findings . Echocardiogram with ejection fraction of 55% without emboli. Neurology services consulted with present workup ongoing suspect right brain infarct versus seizure. EEG is pending. CTA of the neck  with no acute finding and CTA of head negative stenosis or major branch occlusion. Placed on Keppra for suspect seizure and titrated. Subcutaneous heparin added for DVT prophylaxis. Patient is tolerating a regular consistency diet. Physical and occupational therapy evaluations completed with recommendations of physical medicine rehabilitation consult to consider inpatient rehabilitation services   Review of Systems  Musculoskeletal: Positive for myalgias.  Neurological: Positive for weakness and headaches.       Lower extremity spasticity  All other systems reviewed and are negative.   Past Medical History  Diagnosis Date  . HTN (hypertension)   . Hyperlipidemia   . Meningioma 1999  . Myocardial infarction 1985?  Marland Kitchen Headache     "not real frequent" (08/25/2012)  . Arthritis     "probably" (08/25/2012)  . Stroke 08/25/2012    "that's what they think I've had; sudden LUE weakness" (08/25/2012)  . Spastic paraparesis     S/P meningioma resection 1999/notes 08/25/2012  . Paresis of lower extremity 1999    BLE/notes 08/25/2012   Past Surgical History  Procedure Date  . Coronary angioplasty 1970's  .  Cardiac catheterization 1970's  . Brain meningioma excision 1999   History reviewed. No pertinent family history. Social History:  reports that she has quit smoking. Her smoking use included Cigarettes. She has a 20 pack-year smoking history. She has never used smokeless tobacco. She reports that she drinks about 4.2 ounces of alcohol per week. She reports that she does not use illicit drugs. Allergies: No Known Allergies Medications Prior to Admission  Medication Sig Dispense Refill  . aspirin EC 81 MG tablet Take 81 mg by mouth daily.      Marland Kitchen atenolol-chlorthalidone (TENORETIC) 50-25 MG per tablet Take 1 tablet by mouth daily.  90 tablet  3  . baclofen (LIORESAL) 20 MG tablet Take 20 mg by mouth 4 (four) times daily.      . diazepam (VALIUM) 5 MG tablet Take 5 mg by mouth at bedtime.      Marland Kitchen HYDROcodone-acetaminophen (NORCO) 7.5-325 MG per tablet Take 1 tablet by mouth every 6 (six) hours as needed. For pain      . ibuprofen (ADVIL,MOTRIN) 200 MG tablet Take 200 mg by mouth every 6 (six) hours as needed. For pain      . potassium chloride (K-DUR,KLOR-CON) 10 MEQ tablet Take 1 tablet (10 mEq total) by mouth 2 (two) times daily.  200 tablet  3  . quinapril (ACCUPRIL) 40 MG tablet Take 80 mg by mouth every morning.      . simvastatin (ZOCOR) 20 MG tablet Take 1 tablet (20 mg total) by mouth at bedtime.  90 tablet  3  . AMBULATORY NON FORMULARY MEDICATION Medication Name: Right and left double upright  AFO's with double action                               Ankle joints  2 each  prn    Home: Home Living Lives With: Spouse Available Help at Discharge: Family;Available 24 hours/day Type of Home: House Home Access: Other (comment) Home Layout: One level Bathroom Shower/Tub:  (sponge bath) Bathroom Toilet: Standard Bathroom Accessibility: No Home Adaptive Equipment: Bedside commode/3-in-1;Other (comment) (Jazzy power chair) Additional Comments: spastic BIL LE that  allow for stand pivot into w/c  Functional History: Prior Function Toileting: Maximal Dressing: Maximal Meal Prep: Total Light Housekeeping: Total Able to Take Stairs?: No Driving: No Vocation: On disability Comments: Wears AFO plastic into shoes (made by [employer]) Functional Status:  Mobility: Bed Mobility Bed Mobility: Supine to Sit;Sitting - Scoot to Edge of Bed;Sit to Supine Supine to Sit: 1: +2 Total assist;HOB elevated Supine to Sit: Patient Percentage: 10% Sitting - Scoot to Edge of Bed: 1: +1 Total assist Sitting - Scoot to Edge of Bed: Patient Percentage: 10% Sit to Supine: 1: +2 Total assist;HOB flat Sit to Supine: Patient Percentage: 10% Transfers Transfers: Not assessed Ambulation/Gait Ambulation/Gait Assistance: Not tested (comment) Stairs: No Wheelchair Mobility Wheelchair Mobility: No  ADL: ADL Eating/Feeding: Set up Where Assessed - Eating/Feeding: Bed level Grooming: Wash/dry face;Set up Where Assessed - Grooming: Supine, head of bed up Lower Body Dressing: +1 Total assistance Where Assessed - Lower Body Dressing: Supine, head of bed up Transfers/Ambulation Related to ADLs: pt demonstrates BIL LE tone with BIL feet in extension limiting mobility to EOB. pt is unsafe to stand at this time and unabel to tolerate due to pain. Pt at baseline with drop foot per husband. Pt is able to active hip extensors to perfom knee extension with knee in flexion. Pt requires Modified [place] 4 rigid with all movement   ADL Comments: Pt supine on arrival with head rotatated to the Rt side and attending to the right side. Pt demonstates Lt side homonymous hemianopsia see vision assessment. pt with lt eye esotropia (inward rotation) Pt requesting bed pan and requires total (A) for hygiene and log rolling. pt required total+2 pt 20% to get onto bed pan. pt long rolled onto Rt side in attempt to decrease tone. pt with LT UE decrease tone with side lying. Pt provided PROM  using PNF D2  flexion 20 reps. pt with digits in extension after PROM and side lying. Pt reports decreased pain in Lt UE. Pt no active ROM noted.   Cognition: Cognition Overall Cognitive Status: Impaired Arousal/Alertness: Awake/alert Orientation Level: Oriented X4 Attention: Focused;Sustained Focused Attention: Appears intact Sustained Attention: Impaired Sustained Attention Impairment: Functional complex Memory: Appears intact Awareness: Impaired Awareness Impairment: Intellectual impairment;Emergent impairment (of Left visual deficits) Problem Solving: Impaired Problem Solving Impairment: Functional complex;Verbal complex Executive Function: Self Monitoring;Self Correcting;Reasoning Reasoning: Impaired Self Monitoring: Impaired Self Monitoring Impairment: Functional basic Self Correcting: Impaired Self Correcting Impairment: Functional basic Safety/Judgment: Impaired Cognition Overall Cognitive Status: Appears within functional limits for tasks assessed/performed Arousal/Alertness: Awake/alert Orientation Level: Oriented X4 / Intact Behavior During Session: [facility] for tasks performed  Blood pressure 152/88, pulse 83, temperature 98 F (36.7 C), temperature source Oral, resp. rate 16, height 5\' 2"  (1.575 m), weight 73.2 kg (161 lb 6 oz), SpO2 97.00%. Physical Exam  Vitals reviewed. Constitutional:       62 year old white female with significant left sided neglect  Eyes:  Pupils reactive to light  Neck: Neck supple. No thyromegaly present.  Cardiovascular: Normal rate and regular rhythm.   Pulmonary/Chest: Effort normal and breath sounds normal. No respiratory distress.  Abdominal: Soft. Bowel sounds are normal. She exhibits no distension.  Musculoskeletal: She exhibits no edema.       Lower extremity spastic paraparesis  Neurological: She is alert.       Patient was able to name person place as well as date of birth. She was of fair medical historian. She followed basic commands.  Noted left sided neglect.  Skin: Skin is warm and dry.    No results found for this or any previous visit (from the past 24 hour(s)). No results found.  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 Seizure. Total assist. Most appropriate for SNF if unable to be managed at home.  Ranelle Oyster, MD, [place]

## 2012-08-29 NOTE — Progress Notes (Signed)
Physical Therapy Treatment Patient Details Name: Stacy Diaz MRN: [id number] DOB: April 05, 1951 Today's Date: 08/29/2012 Time: 4098-1191 PT Time Calculation (min): 29 min  PT Assessment / Plan / Recommendation Comments on Treatment Session  pt unable to activate trunk to maintain/hold midline or slow deviation away from midline  L neglect remains, but able to get pt to visually track past midline, more with head than eyes    Follow Up Recommendations  SNF;Other (comment) (pt has been denied on rehab)     Does the patient have the potential to tolerate intense rehabilitation     Barriers to Discharge        Equipment Recommendations  Other (comment) (TBD post acute)    Recommendations for Other Services    Frequency Min 3X/week   Plan Discharge plan remains appropriate;Frequency remains appropriate    Precautions / Restrictions Precautions Precautions: Fall   Pertinent Vitals/Pain     Mobility  Bed Mobility Bed Mobility: Supine to Sit;Sitting - Scoot to Edge of Bed Supine to Sit: 1: +2 Total assist;HOB elevated Supine to Sit: Patient Percentage: 20% Sitting - Scoot to Edge of Bed: 1: +1 Total assist Details for Bed Mobility Assistance: pt unable to initiate normal movement or attain R UE hand placement with assist; significant truncal assist Transfers Transfers: Squat Pivot Transfers Squat Pivot Transfers: 1: +2 Total assist;With upper extremity assistance Squat Pivot Transfers: Patient Percentage: 20% Details for Transfer Assistance: able to easily "break tone" bil LE today socks to floor with enough friction to hold knee/hip flexion; signiificant truncal assist with some assist with R Ue Ambulation/Gait Ambulation/Gait Assistance: Not tested (comment) Stairs: No Wheelchair Mobility Wheelchair Mobility: No    Exercises     PT Diagnosis:    PT Problem List:   PT Treatment Interventions:     PT Goals Acute Rehab PT Goals Time For Goal Achievement:  08/26/12 Potential to Achieve Goals: Fair PT Goal: Rolling Supine to Right Side - Progress: Not met PT Transfer Goal: Bed to Chair/Chair to Bed - Progress: Goal set today  Visit Information  Last PT Received On: 08/29/12 Assistance Needed: +2    Subjective Data  Subjective: I am more relaxed today... yeah I got some sleep tonight.   Cognition  Overall Cognitive Status: Appears within functional limits for tasks assessed/performed Arousal/Alertness: Awake/alert Orientation Level: Oriented X4 / Intact Behavior During Session: WFL for tasks performed    Balance  Balance Balance Assessed: Yes Static Sitting Balance Static Sitting - Balance Support: Feet supported;Right upper extremity supported Static Sitting - Level of Assistance: 3: Mod assist;4: Min assist Static Sitting - Comment/# of Minutes: sat EOB appro 10 min working to activate abdominals and trunck as well at attend to L side motorically and visually.  Pt unable to hold midline or keep herself from falling off to R/L back or forward.  R arm overcompensates in a pushing movement over to the L.  End of Session PT - End of Session Activity Tolerance: Patient limited by pain;Patient tolerated treatment well Patient left: in chair;with call bell/phone within reach;Other (comment) (on lift pad) Nurse Communication: Need for lift equipment;Mobility status   GP     [place] Wachter, Eliseo Gum 08/29/2012, 11:49 AM  08/29/2012  [place], PT [phone] (pager)

## 2012-08-29 NOTE — Care Management Note (Signed)
    Page 1 of 1   08/29/2012     11:10:25 AM   CARE MANAGEMENT NOTE 08/29/2012  Patient:  Stacy Diaz, Stacy Diaz   Account Number:  [id number]  Date Initiated:  08/29/2012  Documentation initiated by:  GRAVES-BIGELOW,Thuy Atilano  Subjective/Objective Assessment:   Pt admitted with LUE paralysis. Per PT notes recommendaitons for CIR. Per CIR MD most appropriate plan is for SNF.     Action/Plan:   CSW will work with family closely for disposition needs. CM will continue to f/u.   Anticipated DC Date:  08/31/2012   Anticipated DC Plan:  SKILLED NURSING FACILITY  In-house referral  Clinical Social Worker      DC Planning Services  CM consult      Choice offered to / List presented to:             Status of service:  Completed, signed off Medicare Important Message given?   (If response is "NO", the following Medicare IM given date fields will be blank) Date Medicare IM given:   Date Additional Medicare IM given:    Discharge Disposition:  SKILLED NURSING FACILITY  Per UR Regulation:  Reviewed for med. necessity/level of care/duration of stay  If discussed at Long Length of Stay Meetings, dates discussed:    Comments:

## 2012-08-29 NOTE — Progress Notes (Signed)
UR Completed Ngoc Daughtridge Graves-Bigelow, RN,BSN [phone]  

## 2012-08-29 NOTE — Progress Notes (Signed)
Bilateral:  No evidence of DVT, superficial thrombosis, or Baker's Cyst.   

## 2012-08-29 NOTE — Progress Notes (Signed)
[facility] PROGRESS NOTE  Stacy Diaz AVW:[id number] DOB: 08-Dec-1950 DOA: 08/25/2012 PCP: Evette Georges, MD  Assessment/Plan: 1. Paralysis of the LUE - has occurred before according to the patient but hasnt lasted this long ever before. Of note the patient stopped taking seizure medications (which she had been on for the past 14 years) in August of last year. Cannot obtain MRI/MRA as she has metal in her skull.  2d ECHO: Study Conclusions  Left ventricle: The cavity size was normal. Wall thickness was normal. Systolic function was normal. The estimated ejection fraction was in the range of 50% to 55%.  I question if this might not be a symptom of a partial seizure, with an underlying seizure disorder unmasked due to recently being taken off of seizure medications? EEG report pending, neurology to see patient in consultation- appreciate their help- CTA of brain ordered and is ok, ASA 2. Hyperlipidemia - continue home statin 3. HTN -  will hold off on starting home BP meds due to stroke concern. 4. Chronic spastic paralysis of BLE - continue home valium, PT/OT to eval and treat as well for this and #1, CIR denied 5. UTI - treating with rocephin 1gm IV q24h- ecoli  Code Status: full Family Communication: husband at bedside Disposition Plan: home vs SNF   Consultants:  neurology  Procedures:    Antibiotics:  Rocephin 1/9  HPI/Subjective: Feeling about the same Had BM yesterday   Objective: Filed Vitals:   08/28/12 1400 08/28/12 2100 08/29/12 0334 08/29/12 1330  BP: 138/87 125/79 152/88 129/78  Pulse: 75 84 83 96  Temp: 98.6 F (37 C) 98.7 F (37.1 C) 98 F (36.7 C) 97.8 F (36.6 C)  TempSrc:      Resp: 18 18 16 17   Height:      Weight:      SpO2: 97% 95% 97% 99%    Intake/Output Summary (Last 24 hours) at 08/29/12 1529 Last data filed at 08/29/12 0900  Gross per 24 hour  Intake    240 ml  Output    250 ml  Net    -10 ml   Filed Weights   08/26/12 0016 08/27/12 0500 08/28/12 0500  Weight: 72.122 kg (159 lb) 73.165 kg (161 lb 4.8 oz) 73.2 kg (161 lb 6 oz)    Exam:   General:  A+Ox3, NAD  Cardiovascular: rrr  Respiratory: clear anterior  Abdomen: +BS,soft, NT  Paralysis of left arm  Data Reviewed: Basic Metabolic Panel:  Lab 08/25/12 8295 08/25/12 1716  NA 139 138  K 3.7 3.7  CL 103 98  CO2 -- 24  GLUCOSE 108* 110*  BUN 14 14  CREATININE 0.80 0.52  CALCIUM -- 9.8  MG -- --  PHOS -- --   Liver Function Tests:  Lab 08/25/12 1716  AST 18  ALT 13  ALKPHOS 46  BILITOT 0.4  PROT 7.2  ALBUMIN 4.3   No results found for this basename: LIPASE:5,AMYLASE:5 in the last 168 hours No results found for this basename: AMMONIA:5 in the last 168 hours CBC:  Lab 08/25/12 1750 08/25/12 1716  WBC -- 7.9  NEUTROABS -- 4.7  HGB 15.3* 14.8  HCT 45.0 43.1  MCV -- 91.5  PLT -- 233   Cardiac Enzymes:  Lab 08/25/12 1716  CKTOTAL --  CKMB --  CKMBINDEX --  TROPONINI <0.30   BNP (last 3 results) No results found for this basename: PROBNP:3 in the last 8760 hours CBG:  Lab 08/27/12 1658 08/27/12  1148 08/25/12 1755  GLUCAP 90 93 98    Recent Results (from the past 240 hour(s))  URINE CULTURE     Status: Normal   Collection Time   08/25/12  7:09 PM      Component Value Range Status Comment   Specimen Description URINE, CATHETERIZED   Final    Special Requests NONE   Final    Culture  Setup Time 08/25/2012 20:50   Final    Colony Count >=100,000 COLONIES/ML   Final    Culture ESCHERICHIA COLI   Final    Report Status 08/27/2012 FINAL   Final    Organism ID, Bacteria ESCHERICHIA COLI   Final      Studies: No results found.  Scheduled Meds:    . aspirin EC  325 mg Oral Daily  . baclofen  20 mg Oral QID  . cefTRIAXone (ROCEPHIN)  IV  1 g Intravenous QHS  . diazepam  5 mg Oral QHS  . heparin  5,000 Units Subcutaneous Q8H  . levETIRAcetam  250 mg Oral BID  . lidocaine  1 patch Transdermal Q24H  .  LORazepam  1 mg Intravenous Once  . simvastatin  20 mg Oral QHS   Continuous Infusions:   Principal Problem:  *Paralysis of upper limb Active Problems:  HYPERLIPIDEMIA  HYPERTENSION  INJURY, NERVE, PELVIS/LWR LIMB NOS  Seizures  UTI (lower urinary tract infection)    Time spent: 35    [facility], Quintavious Rinck  [facility] Pager [phone]. If 8PM-8AM, please contact night-coverage at [email or web address], password [facility] 08/29/2012, 3:29 PM  LOS: 4 days

## 2012-08-29 NOTE — Progress Notes (Signed)
Pt. HR increased to 150's non-sustained for <1 min.  Pt denies any SOB, CP, or palpitations.  HR returned to baseline . ~10 mins after HR decreased patient c/o nausea and dizziness.  BP 158/88 O2 sat 97%RA HR 98 T 97.9.  Pt. States "I feel anxious."  MD notified.  Orders received.  Will continue to monitor patient.

## 2012-08-29 NOTE — Clinical Social Work Psychosocial (Signed)
     Clinical Social Work Department BRIEF PSYCHOSOCIAL ASSESSMENT 08/29/2012  Patient:  JAVAEH, MUSCATELLO     Account Number:  [id number]     Admit date:  08/25/2012  Clinical Social Worker:  Margaree Mackintosh  Date/Time:  08/29/2012 12:00 M  Referred by:  Physician  Date Referred:  08/29/2012 Referred for  SNF Placement   Other Referral:   Interview type:  Patient Other interview type:   Spouse and Son.    PSYCHOSOCIAL DATA Living Status:  FAMILY Admitted from facility:   Level of care:   Primary support name:  Wm Fruchter: [phone] Primary support relationship to patient:  SPOUSE Degree of support available:   Adequate.    CURRENT CONCERNS Current Concerns  Post-Acute Placement   Other Concerns:    SOCIAL WORK ASSESSMENT / PLAN Clincial Social Worker recieved referral indicating pt may benefit from SNF at dc.  CSW reviewed chart and met with pt and family at bedside-pt not participatory in assessment. CSW reviewed recommendations-family agreeable to SNF at dc. CSW reviewed SNF process.  CSW to continue to follow and assist as needed.   Assessment/plan status:  Information/Referral to [facility] Other assessment/ plan:   Information/referral to community resources:   SNF    PATIENTS/FAMILYS RESPONSE TO PLAN OF CARE: Pt not participatory in this assessment.  Family was pleasant and agreeable to intervention.

## 2012-08-30 DIAGNOSIS — R569 Unspecified convulsions: Secondary | ICD-10-CM

## 2012-08-30 MED ORDER — SENNOSIDES-DOCUSATE SODIUM 8.6-50 MG PO TABS: 1.0000 | ORAL_TABLET | Freq: Every evening | ORAL | Status: DC | PRN

## 2012-08-30 MED ORDER — LIDOCAINE 5 % EX PTCH: 1.0000 | MEDICATED_PATCH | CUTANEOUS | Status: DC

## 2012-08-30 MED ORDER — LORAZEPAM 0.5 MG PO TABS
0.5000 mg | ORAL_TABLET | Freq: Four times a day (QID) | ORAL | Status: DC | PRN
  Administered 2012-08-30: 0.5 mg via ORAL
  Filled 2012-08-30: qty 1

## 2012-08-30 MED ORDER — HYDROCODONE-ACETAMINOPHEN 7.5-325 MG PO TABS: 1.0000 | ORAL_TABLET | ORAL | Status: DC | PRN

## 2012-08-30 MED ORDER — LORAZEPAM 0.5 MG PO TABS: 0.5000 mg | ORAL_TABLET | Freq: Four times a day (QID) | ORAL | Status: DC | PRN

## 2012-08-30 MED ORDER — FLEET ENEMA 7-19 GM/118ML RE ENEM: 1.0000 | ENEMA | Freq: Every day | RECTAL | Status: DC | PRN

## 2012-08-30 MED ORDER — LEVETIRACETAM 250 MG PO TABS: 250.0000 mg | ORAL_TABLET | Freq: Two times a day (BID) | ORAL | Status: DC

## 2012-08-30 MED ORDER — ASPIRIN 325 MG PO TBEC: 325.0000 mg | DELAYED_RELEASE_TABLET | Freq: Every day | ORAL | Status: DC

## 2012-08-30 NOTE — Progress Notes (Signed)
Clinical Social Worker spoke with [name]-spouse's first choice for SNF.  [name] may have a semi-private room available-family agreeable to semi-private room.  [name] to follow up with this CSW shortly.   Angelia Mould, MSW, [place] [phone]

## 2012-08-30 NOTE — Procedures (Signed)
EEG NUMBER:  [id number].  REFERRING PHYSICIAN:  Dr. Ritta Slot.  INDICATION FOR STUDY:  A 62 year old lady with bilateral lower extremity weakness with somewhat new onset involuntary movement and increased tone involving left upper extremity.  The patient has known areas of encephalomalacia intracranially and bilaterally.  This is a routine EEG recording performed during wakefulness. Background activity was asymmetrical with 9 hertz alpha activity recorded from the left posterior head region, as well as low-amplitude beta activity recorded from the frontal and central regions.  On the right, there was continuous, moderately severe slowing with 1-2 hertz moderate amplitude delta activity with superimposed low-amplitude beta activity in the frontal and central regions, and 8-9 Hz alpha rhythm recorded from the right posterior head region, which was less prominent than alpha activity on the left.  Photic stimulation was not performed. Hyperventilation was not performed.  No epileptiform discharges were recorded.  INTERPRETATION:  This EEG is abnormal with moderately severe, continuous slowing of cerebral activity involving the right hemisphere, consistent with previous surgery and known intracranial encephalomalacia.  No evidence of an epileptic disorder was demonstrated.     Noel Christmas, MD    MV:HQIO D:  08/29/2012 17:22:43  T:  08/30/2012 02:45:06  Job #:  [id number]

## 2012-08-30 NOTE — Progress Notes (Signed)
Physical Therapy Treatment Patient Details Name: Stacy Diaz MRN: [id number] DOB: 1950-10-28 Today's Date: 08/30/2012 Time: 4098-1191 PT Time Calculation (min): 38 min  PT Assessment / Plan / Recommendation Comments on Treatment Session  truncal activation continues to elude [place] for the most part.  A part of the treatment stresses attending to the Right incl. work to incr gaze across midline to the Left.    Follow Up Recommendations  SNF;Other (comment)     Does the patient have the potential to tolerate intense rehabilitation     Barriers to Discharge        Equipment Recommendations  None recommended by PT    Recommendations for Other Services    Frequency Min 3X/week   Plan Discharge plan remains appropriate;Frequency remains appropriate    Precautions / Restrictions Precautions Precautions: Fall   Pertinent Vitals/Pain     Mobility  Bed Mobility Bed Mobility: Supine to Sit;Sitting - Scoot to Edge of Bed Supine to Sit: 1: +2 Total assist;HOB elevated Supine to Sit: Patient Percentage: 20% (all with R UE) Sitting - Scoot to Edge of Bed: 1: +1 Total assist Sitting - Scoot to Edge of Bed: Patient Percentage: 10% Details for Bed Mobility Assistance: pt unable to initiate movement, whether this is a planning and/or execution problem;  pt needs v/tc's for hand placement and significant truncal facilitation or assist Transfers Transfers: Squat Pivot Transfers Squat Pivot Transfers: 1: +2 Total assist;With upper extremity assistance Squat Pivot Transfers: Patient Percentage: 10% Details for Transfer Assistance: tone actually hindering transfer today, could not get pt's legs to stay flexed until weight bearing held them in place. Ambulation/Gait Ambulation/Gait Assistance: Not tested (comment) Stairs: No Wheelchair Mobility Wheelchair Mobility: No    Exercises Other Exercises Other Exercises: Worked to break up LE tone with stretches ino Abd and ER bilaterally   PT  Diagnosis:    PT Problem List:   PT Treatment Interventions:     PT Goals Acute Rehab PT Goals Time For Goal Achievement: 09/09/12 Potential to Achieve Goals: Fair PT Goal: Rolling Supine to Right Side - Progress: Not met PT Goal: Rolling Supine to Left Side - Progress: Not met PT Goal: Supine/Side to Sit - Progress: Not met PT Transfer Goal: Bed to Chair/Chair to Bed - Progress: Not met  Visit Information  Last PT Received On: 08/30/12 Assistance Needed: +2    Subjective Data  Subjective: I've been trying (to look L)... it's so frustrating.   Cognition  Overall Cognitive Status: Appears within functional limits for tasks assessed/performed Arousal/Alertness: Awake/alert Orientation Level: Oriented X4 / Intact Behavior During Session: [facility] for tasks performed    Balance  Balance Balance Assessed: Yes Static Sitting Balance Static Sitting - Balance Support: Feet supported;No upper extremity supported;Right upper extremity supported Static Sitting - Level of Assistance: 3: Mod assist;4: Min assist Static Sitting - Comment/# of Minutes: sat EOB >12 min working to activate truncal muscles using prop on elbows esp R UE and extreme lean forward and back while trying to elicit startle response.  Little truncal response L or R noted and little beneficial use of the UE other than pushing or resisting  against righting to the L  End of Session PT - End of Session Activity Tolerance: Patient tolerated treatment well Patient left: in chair;with call bell/phone within reach Nurse Communication: Mobility status;Need for lift equipment   GP     Derelle Cockrell, Eliseo Gum 08/30/2012, 12:50 PM  08/30/2012  [place] Bing, PT [phone] (pager)

## 2012-08-30 NOTE — Discharge Summary (Addendum)
Physician Discharge Summary  Stacy Diaz HYQ:[id number] DOB: 23-Mar-1951 DOA: 08/25/2012  PCP: Stacy Georges, MD  Admit date: 08/25/2012 Discharge date: 08/30/2012  Time spent: 35 minutes  Recommendations for Outpatient Follow-up:  1. To SNF 2. Follow up with Dr. Vear Diaz (referred by PCP)  Discharge Diagnoses:  Principal Problem:  *Paralysis of upper limb Active Problems:  HYPERLIPIDEMIA  HYPERTENSION  INJURY, NERVE, PELVIS/LWR LIMB NOS  Seizures  UTI (lower urinary tract infection)   Discharge Condition: improved  Diet recommendation: none  Filed Weights   08/27/12 0500 08/28/12 0500 08/30/12 0636  Weight: 73.165 kg (161 lb 4.8 oz) 73.2 kg (161 lb 6 oz) 73.5 kg (162 lb 0.6 oz)    History of present illness:  Stacy Diaz is a 62 y.o. 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 The patient reports this onset at around 10 AM this morning, this is not the first time this has happened however when it has happened in the past usually it would go away after 1-2 hours. She notes she is able to feal with the LUE but has no motor control over it. She does not complain of facial droop nor dysarthria. The spastic paralysis in BLE is subjectively worse today than baseline as well.  In the ED CT scan of her head demonstrated no acute abnormalities, neurology has been consulted and hospitalist has been asked to admit the patient.   Hospital Course:  1. Paralysis of the LUE - has occurred before according to the patient but hasn't lasted this long ever before. Of note the patient stopped taking seizure medications (which she had been on for the past 14 years) in August of last year. Cannot obtain MRI/MRA as she has metal in her skull. 2d ECHO done I question if this might not be a symptom of a partial seizure, with an underlying seizure disorder unmasked due to recently being taken off of  seizure medications? EEG report below, neurologysaw patient in consultation-  CTA of brain with no new stroke, results below, ASA 2. Hyperlipidemia - continue home statin 3. HTN - home meds. 4. 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 UTI - treating with rocephin 1gm IV q24h- ecoli- treated  Procedures: EEG: This EEG is abnormal with moderately severe, continuous  slowing of cerebral activity involving the right hemisphere, consistent  with previous surgery and known intracranial encephalomalacia. No  evidence of an epileptic disorder was demonstrated   Echo: Left ventricle: The cavity size was normal. Wall thickness was normal. Systolic function was normal. The estimated ejection fraction was in the range of 50% to 55%.   Consultations:  neurology  Discharge Exam: Filed Vitals:   08/29/12 0334 08/29/12 1330 08/29/12 2305 08/30/12 0636  BP: 152/88 129/78 122/75 128/79  Pulse: 83 96 90 75  Temp: 98 F (36.7 C) 97.8 F (36.6 C) 98 F (36.7 C) 97.3 F (36.3 C)  TempSrc:   Oral Oral  Resp: 16 17 17 18   Height:      Weight:    73.5 kg (162 lb 0.6 oz)  SpO2: 97% 99% 97% 98%    General: left side weakness of arm and B/L legs, chronically ill appearing female Cardiovascular: rrr Respiratory: clear anterior  Discharge Instructions      Discharge Orders    Future Orders Please Complete By Expires   Diet - low sodium heart healthy  Increase activity slowly      Discharge instructions      Comments:   To SNF for PT/OT       Medication List     As of 08/30/2012  2:07 PM    STOP taking these medications         quinapril 40 MG tablet   Commonly known as: ACCUPRIL      TAKE these medications         AMBULATORY NON FORMULARY MEDICATION   Medication Name: Right and left double upright                                AFO's with double action                                Ankle joints      aspirin 325 MG EC tablet   Take 1 tablet  (325 mg total) by mouth daily.      atenolol-chlorthalidone 50-25 MG per tablet   Commonly known as: TENORETIC   Take 1 tablet by mouth daily.      baclofen 20 MG tablet   Commonly known as: LIORESAL   Take 20 mg by mouth 4 (four) times daily.      diazepam 5 MG tablet   Commonly known as: VALIUM   Take 5 mg by mouth at bedtime.      HYDROcodone-acetaminophen 7.5-325 MG per tablet   Commonly known as: NORCO   Take 1 tablet by mouth every 4 (four) hours as needed.      ibuprofen 200 MG tablet   Commonly known as: ADVIL,MOTRIN   Take 200 mg by mouth every 6 (six) hours as needed. For pain      levETIRAcetam 250 MG tablet   Commonly known as: KEPPRA   Take 1 tablet (250 mg total) by mouth 2 (two) times daily.      lidocaine 5 %   Commonly known as: LIDODERM   Place 1 patch onto the skin daily. Remove & Discard patch within 12 hours or as directed by MD      LORazepam 0.5 MG tablet   Commonly known as: ATIVAN   Take 1 tablet (0.5 mg total) by mouth every 6 (six) hours as needed for anxiety.      potassium chloride 10 MEQ tablet   Commonly known as: K-DUR,KLOR-CON   Take 1 tablet (10 mEq total) by mouth 2 (two) times daily.      senna-docusate 8.6-50 MG per tablet   Commonly known as: Senokot-S   Take 1 tablet by mouth at bedtime as needed.      simvastatin 20 MG tablet   Commonly known as: ZOCOR   Take 1 tablet (20 mg total) by mouth at bedtime.      sodium phosphate 7-19 GM/118ML Enem   Place 1 enema rectally daily as needed.         Follow-up Information    Follow up with Stacy [name], MD. In 1 week.   Contact information:   [address] [place] [address] [phone]           The results of significant diagnostics from this hospitalization (including imaging, microbiology, ancillary and laboratory) are listed below for reference.    Significant Diagnostic Studies: Ct Angio Head W/cm &/or Wo Cm  08/26/2012  *RADIOLOGY REPORT*  Clinical Data:  62 year old female with suspected stroke. Extremity weakness.  No improvement in symptoms. 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  CT ANGIOGRAPHY HEAD AND NECK  Technique:  Multidetector CT imaging of the head and neck was performed using the standard protocol during bolus administration of intravenous contrast.  Multiplanar CT image reconstructions including MIPs were obtained to evaluate the vascular anatomy. Carotid stenosis measurements (when applicable) are obtained utilizing NASCET criteria, using the distal internal carotid diameter as the denominator.  Contrast: 50mL OMNIPAQUE IOHEXOL 350 MG/ML SOLN  Comparison:  Head CTs without contrast 08/26/2012, 08/25/2012, 05/28/2011.  CTA NECK  Findings:  Mild dependent atelectasis.  No superior mediastinal lymphadenopathy.  Negative thyroid, larynx, pharynx, parapharyngeal spaces, retropharyngeal space, sublingual space, submandibular glands and parotid glands. Visualized orbit soft tissues are within normal limits.  Visualized paranasal sinuses and mastoids are clear.  Degenerative changes in the cervical spine.  Skull findings reported below, otherwise no other osseous abnormality identified. No cervical lymphadenopathy.  Vascular Findings: Mild arch atherosclerosis.  Three-vessel arch configuration.  No great vessel origin stenosis.  No right common carotid artery origin stenosis.  Calcified plaque at the lateral right ICA bulb.  No right ICA origin stenosis.  No cervical right ICA stenosis.  Normal right vertebral artery origin.  Moderately tortuous proximal right vertebral.  No right vertebral artery stenosis or other abnormality in the neck.  Left common carotid artery is patent without stenosis.  Mild soft and calcified plaque occurs at the left carotid bifurcation involving the posterior left ICA bulb.  No left ICA origin stenosis.  Mildly tortuous but otherwise negative cervical left ICA.  Dominant appearing left vertebral  artery.  Normal origin.  Tortuous course of the proximal left vertebral, which extends to the tracheoesophageal groove prior to entering the left vertebral transverse foramen.  Otherwise negative left vertebral artery in the neck.  See intracranial findings below.   Review of the MIP images confirms the above findings.  IMPRESSION: 1.  Mild arterial tortuosity and atherosclerosis in the neck.  No cervical carotid or vertebral artery stenosis or acute finding. 2.  Intracranial findings are below. 3.  No acute findings identified in the neck.  CTA HEAD  Findings:  Remote posterior superior craniectomy and associated changes to the calvarium are stable since 2006.  Associated cerclage wires and numerous vascular clips both at the craniectomy site and extending into the interhemispheric fissure. No acute osseous abnormality identified.  No acute orbit or scalp soft tissue abnormality.  Chronic superior hemisphere encephalomalacia and gliosis.  No ventriculomegaly. No midline shift, mass effect, or evidence of mass lesion.  No acute intracranial hemorrhage identified.  No evidence of cortically based acute infarction identified.  No abnormal enhancement identified.  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 Other visualized intracranial vascular structures appear patent.  Fairly codominant distal vertebral arteries.  The distal right vertebral artery is diminutive beyond the origin of PICA, but without significant stenosis.  Normal left PICA.  Patent vertebrobasilar junction.  Patent AICA origins.  No basilar stenosis.  SCA and PCA origins are within normal limits.  Posterior communicating arteries are diminutive or absent.  Bilateral PCA branches are within normal limits.  Antegrade flow in both ICA siphons.  Minimal ICA atherosclerosis with no stenosis.  Ophthalmic artery origins are within normal limits.  Carotid termini are patent.   MCA and ACA origins are within normal limits.  Anterior communicating artery is  within normal limits. The right ACA A2 and distal branches appeared dominant, the left diminutive, but without focal stenosis or major branch occlusion identified.  MCA M1 segments are patent and within normal limits.  Bilateral MCA bifurcations appear within normal limits.  No major MCA branch occlusion or stenosis is identified.   Review of the MIP images confirms the above findings.  IMPRESSION: 1.  Diminutive left ACA A2 and distal branches, but favor normal anatomic variation.  No focal ACA stenosis or major branch occlusion.  2.  Otherwise negative anterior circulation. 3.  Negative posterior circulation. 4.  Remote postoperative changes to the skull and brain. No acute intracranial abnormality.   Original Report Authenticated By: Erskine Speed, M.D.    Ct Head Wo Contrast  08/26/2012  *RADIOLOGY REPORT*  Clinical Data: Concern for acute stroke versus seizure.  Evaluate for interval change.  CT HEAD WITHOUT CONTRAST  Technique:  Contiguous axial images were obtained from the base of the skull through the vertex without contrast.  Comparison:  08/25/2012  Findings: Motion degraded exam.  No obvious interval change in the bihemispheric encephalomalacia related previous surgery.  No demonstrable areas of cortical infarction or intracranial hemorrhage.  IMPRESSION: Within limits of detection on this motion degraded exam, no interval change from earlier examination.   Original Report Authenticated By: Davonna Belling, M.D.    Ct Head Wo Contrast  08/25/2012  *RADIOLOGY REPORT*  Clinical Data: Weakness of the extremities  CT HEAD WITHOUT CONTRAST  Technique:  Contiguous axial images were obtained from the base of the skull through the vertex without contrast.  Comparison: None.  Findings: Sequelae of vertex craniotomy  identified with multiple cerclage wires and surgical clips in and around the falx cerebri Previous cranioplasty in this  region.  Clear paranasal sinuses and orbits.   Encephalomalacia of the right and left parasagittal frontal parietal cortex is stable.  No definite acute infarct or hemorrhage.  No recurrent mass lesion.  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  IMPRESSION: Stable exam.  No acute intracranial findings.  Postsurgical change related to what was reportedly a benign neoplasm, likely previous meningioma removal.   Original Report Authenticated By: Davonna Belling, M.D.    Ct Angio Neck W/cm &/or Wo/cm  08/26/2012  *RADIOLOGY REPORT*  Clinical Data:  62 year old female with suspected stroke. Extremity weakness.  No improvement in symptoms. 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  CT ANGIOGRAPHY HEAD AND NECK  Technique:  Multidetector CT imaging of the head and neck was performed using the standard protocol during bolus administration of intravenous contrast.  Multiplanar CT image reconstructions including MIPs were obtained to evaluate the vascular anatomy. Carotid stenosis measurements (when applicable) are obtained utilizing NASCET criteria, using the distal internal carotid diameter as the denominator.  Contrast: 50mL OMNIPAQUE IOHEXOL 350 MG/ML SOLN  Comparison:  Head CTs without contrast 08/26/2012, 08/25/2012, 05/28/2011.  CTA NECK  Findings:  Mild dependent atelectasis.  No superior mediastinal lymphadenopathy.  Negative thyroid, larynx, pharynx, parapharyngeal spaces, retropharyngeal space, sublingual space, submandibular glands and parotid glands. Visualized orbit soft tissues are within normal limits.  Visualized paranasal sinuses and mastoids are clear.  Degenerative changes in the cervical spine.  Skull findings reported below, otherwise no other osseous abnormality identified. No cervical lymphadenopathy.  Vascular Findings: Mild arch atherosclerosis.  Three-vessel arch configuration.  No great vessel origin stenosis.  No right  common carotid artery origin stenosis.  Calcified plaque at the lateral  right ICA bulb.  No right ICA origin stenosis.  No cervical right ICA stenosis.  Normal right vertebral artery origin.  Moderately tortuous proximal right vertebral.  No right vertebral artery stenosis or other abnormality in the neck.  Left common carotid artery is patent without stenosis.  Mild soft and calcified plaque occurs at the left carotid bifurcation involving the posterior left ICA bulb.  No left ICA origin stenosis.  Mildly tortuous but otherwise negative cervical left ICA.  Dominant appearing left vertebral artery.  Normal origin.  Tortuous course of the proximal left vertebral, which extends to the tracheoesophageal groove prior to entering the left vertebral transverse foramen.  Otherwise negative left vertebral artery in the neck.  See intracranial findings below.   Review of the MIP images confirms the above findings.  IMPRESSION: 1.  Mild arterial tortuosity and atherosclerosis in the neck.  No cervical carotid or vertebral artery stenosis or acute finding. 2.  Intracranial findings are below. 3.  No acute findings identified in the neck.  CTA HEAD  Findings:  Remote posterior superior craniectomy and associated changes to the calvarium are stable since 2006.  Associated cerclage wires and numerous vascular clips both at the craniectomy site and extending into the interhemispheric fissure. No acute osseous abnormality identified.  No acute orbit or scalp soft tissue abnormality.  Chronic superior hemisphere encephalomalacia and gliosis.  No ventriculomegaly. No midline shift, mass effect, or evidence of mass lesion.  No acute intracranial hemorrhage identified.  No evidence of cortically based acute infarction identified.  No abnormal enhancement identified.  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 Other visualized intracranial vascular  structures appear patent.  Fairly codominant distal vertebral arteries.  The distal right vertebral artery is diminutive beyond the origin of PICA, but without significant stenosis.  Normal left PICA.  Patent vertebrobasilar junction.  Patent AICA origins.  No basilar stenosis.  SCA and PCA origins are within normal limits.  Posterior communicating arteries are diminutive or absent.  Bilateral PCA branches are within normal limits.  Antegrade flow in both ICA siphons.  Minimal ICA atherosclerosis with no stenosis.  Ophthalmic artery origins are within normal limits.  Carotid termini are patent.  MCA and ACA origins are within normal limits.  Anterior communicating artery is within normal limits. The right ACA A2 and distal branches appeared dominant, the left diminutive, but without focal stenosis or major branch occlusion identified.  MCA M1 segments are patent and within normal limits.  Bilateral MCA bifurcations appear within normal limits.  No major MCA branch occlusion or stenosis is identified.   Review of the MIP images confirms the above findings.  IMPRESSION: 1.  Diminutive left ACA A2 and distal branches, but favor normal anatomic variation.  No focal ACA stenosis or major branch occlusion.  2.  Otherwise negative anterior circulation. 3.  Negative posterior circulation. 4.  Remote postoperative changes to the skull and brain. No acute intracranial abnormality.   Original Report Authenticated By: Erskine Speed, M.D.     Microbiology: Recent Results (from the past 240 hour(s))  URINE CULTURE     Status: Normal   Collection Time   08/25/12  7:09 PM      Component Value Range Status Comment   Specimen Description URINE, CATHETERIZED   Final    Special Requests NONE   Final    Culture  Setup Time 08/25/2012 20:50   Final    Colony Count >=100,000  COLONIES/ML   Final    Culture ESCHERICHIA COLI   Final    Report Status 08/27/2012 FINAL   Final    Organism ID, Bacteria ESCHERICHIA COLI   Final       Labs: Basic Metabolic Panel:  Lab 08/25/12 4098 08/25/12 1716  NA 139 138  K 3.7 3.7  CL 103 98  CO2 -- 24  GLUCOSE 108* 110*  BUN 14 14  CREATININE 0.80 0.52  CALCIUM -- 9.8  MG -- --  PHOS -- --   Liver Function Tests:  Lab 08/25/12 1716  AST 18  ALT 13  ALKPHOS 46  BILITOT 0.4  PROT 7.2  ALBUMIN 4.3   No results found for this basename: LIPASE:5,AMYLASE:5 in the last 168 hours No results found for this basename: AMMONIA:5 in the last 168 hours CBC:  Lab 08/25/12 1750 08/25/12 1716  WBC -- 7.9  NEUTROABS -- 4.7  HGB 15.3* 14.8  HCT 45.0 43.1  MCV -- 91.5  PLT -- 233   Cardiac Enzymes:  Lab 08/25/12 1716  CKTOTAL --  CKMB --  CKMBINDEX --  TROPONINI <0.30   BNP: BNP (last 3 results) No results found for this basename: PROBNP:3 in the last 8760 hours CBG:  Lab 08/27/12 1658 08/27/12 1148 08/25/12 1755  GLUCAP 90 93 98       Signed:  Kiona Blume  [facility] 08/30/2012, 2:07 PM

## 2012-09-02 ENCOUNTER — Emergency Department (HOSPITAL_COMMUNITY): Payer: Medicare Other

## 2012-09-02 ENCOUNTER — Observation Stay (HOSPITAL_COMMUNITY)
Admission: EM | Admit: 2012-09-02 | Discharge: 2012-09-07 | DRG: 065 | Disposition: A | Discharge status: Skilled Nursing Facility | Payer: Medicare Other | Attending: Internal Medicine | Admitting: Internal Medicine

## 2012-09-02 ENCOUNTER — Encounter (HOSPITAL_COMMUNITY): Payer: Self-pay | Admitting: Emergency Medicine

## 2012-09-02 DIAGNOSIS — I6350 Cerebral infarction due to unspecified occlusion or stenosis of unspecified cerebral artery: Principal | ICD-10-CM | POA: Diagnosis present

## 2012-09-02 DIAGNOSIS — K5900 Constipation, unspecified: Secondary | ICD-10-CM | POA: Diagnosis present

## 2012-09-02 DIAGNOSIS — Z79899 Other long term (current) drug therapy: Secondary | ICD-10-CM

## 2012-09-02 DIAGNOSIS — H53469 Homonymous bilateral field defects, unspecified side: Secondary | ICD-10-CM | POA: Diagnosis present

## 2012-09-02 DIAGNOSIS — Z86011 Personal history of benign neoplasm of the brain: Secondary | ICD-10-CM

## 2012-09-02 DIAGNOSIS — I4949 Other premature depolarization: Secondary | ICD-10-CM

## 2012-09-02 DIAGNOSIS — Z7982 Long term (current) use of aspirin: Secondary | ICD-10-CM

## 2012-09-02 DIAGNOSIS — I639 Cerebral infarction, unspecified: Secondary | ICD-10-CM

## 2012-09-02 DIAGNOSIS — G8220 Paraplegia, unspecified: Secondary | ICD-10-CM | POA: Diagnosis present

## 2012-09-02 DIAGNOSIS — G40909 Epilepsy, unspecified, not intractable, without status epilepticus: Secondary | ICD-10-CM

## 2012-09-02 DIAGNOSIS — D332 Benign neoplasm of brain, unspecified: Secondary | ICD-10-CM

## 2012-09-02 DIAGNOSIS — N390 Urinary tract infection, site not specified: Secondary | ICD-10-CM

## 2012-09-02 DIAGNOSIS — R51 Headache: Secondary | ICD-10-CM | POA: Diagnosis present

## 2012-09-02 DIAGNOSIS — R569 Unspecified convulsions: Secondary | ICD-10-CM

## 2012-09-02 DIAGNOSIS — I10 Essential (primary) hypertension: Secondary | ICD-10-CM

## 2012-09-02 DIAGNOSIS — E785 Hyperlipidemia, unspecified: Secondary | ICD-10-CM

## 2012-09-02 DIAGNOSIS — S8490XA Injury of unspecified nerve at lower leg level, unspecified leg, initial encounter: Secondary | ICD-10-CM

## 2012-09-02 DIAGNOSIS — G8320 Monoplegia of upper limb affecting unspecified side: Secondary | ICD-10-CM

## 2012-09-02 DIAGNOSIS — L0232 Furuncle of buttock: Secondary | ICD-10-CM

## 2012-09-02 DIAGNOSIS — Z8673 Personal history of transient ischemic attack (TIA), and cerebral infarction without residual deficits: Secondary | ICD-10-CM

## 2012-09-02 DIAGNOSIS — R29810 Facial weakness: Secondary | ICD-10-CM

## 2012-09-02 DIAGNOSIS — M129 Arthropathy, unspecified: Secondary | ICD-10-CM | POA: Diagnosis present

## 2012-09-02 LAB — URINALYSIS, ROUTINE W REFLEX MICROSCOPIC
Glucose, UA: NEGATIVE mg/dL
Hgb urine dipstick: NEGATIVE
Ketones, ur: NEGATIVE mg/dL
Ketones, ur: NEGATIVE mg/dL
Nitrite: NEGATIVE
Protein, ur: NEGATIVE mg/dL
Specific Gravity, Urine: 1.016 (ref 1.005–1.030)
pH: 6 (ref 5.0–8.0)
pH: 5.5 (ref 5.0–8.0)

## 2012-09-02 LAB — URINE MICROSCOPIC-ADD ON

## 2012-09-02 LAB — COMPREHENSIVE METABOLIC PANEL
AST: 27 U/L (ref 0–37)
Albumin: 3.4 g/dL — ABNORMAL LOW (ref 3.5–5.2)
Alkaline Phosphatase: 56 U/L (ref 39–117)
Chloride: 101 mEq/L (ref 96–112)
Potassium: 3.4 mEq/L — ABNORMAL LOW (ref 3.5–5.1)
Sodium: 138 mEq/L (ref 135–145)
Total Bilirubin: 0.4 mg/dL (ref 0.3–1.2)
Total Protein: 6.7 g/dL (ref 6.0–8.3)

## 2012-09-02 LAB — CBC WITH DIFFERENTIAL/PLATELET
Basophils Absolute: 0 10*3/uL (ref 0.0–0.1)
Basophils Relative: 0 % (ref 0–1)
Hemoglobin: 14.6 g/dL (ref 12.0–15.0)
MCHC: 34.7 g/dL (ref 30.0–36.0)
Monocytes Relative: 12 % (ref 3–12)
Neutro Abs: 6.7 10*3/uL (ref 1.7–7.7)
Neutrophils Relative %: 61 % (ref 43–77)
Platelets: 261 10*3/uL (ref 150–400)
RDW: 13.2 % (ref 11.5–15.5)

## 2012-09-02 MED ORDER — FLEET ENEMA 7-19 GM/118ML RE ENEM: 1.0000 | ENEMA | Freq: Every day | RECTAL | Status: DC | PRN

## 2012-09-02 MED ORDER — LORAZEPAM 0.5 MG PO TABS
0.5000 mg | ORAL_TABLET | Freq: Four times a day (QID) | ORAL | Status: DC | PRN
  Administered 2012-09-05 – 2012-09-06 (×2): 0.5 mg via ORAL
  Filled 2012-09-02 (×2): qty 1

## 2012-09-02 MED ORDER — POTASSIUM CHLORIDE CRYS ER 10 MEQ PO TBCR
10.0000 meq | EXTENDED_RELEASE_TABLET | Freq: Two times a day (BID) | ORAL | Status: DC
  Administered 2012-09-03 – 2012-09-07 (×10): 10 meq via ORAL
  Filled 2012-09-02 (×11): qty 1

## 2012-09-02 MED ORDER — ASPIRIN EC 325 MG PO TBEC
325.0000 mg | DELAYED_RELEASE_TABLET | Freq: Every day | ORAL | Status: DC
  Administered 2012-09-03 – 2012-09-04 (×2): 325 mg via ORAL
  Filled 2012-09-02 (×3): qty 1

## 2012-09-02 MED ORDER — BACLOFEN 20 MG PO TABS
20.0000 mg | ORAL_TABLET | Freq: Three times a day (TID) | ORAL | Status: DC
  Administered 2012-09-02 – 2012-09-07 (×13): 20 mg via ORAL
  Filled 2012-09-02 (×16): qty 1

## 2012-09-02 MED ORDER — SODIUM CHLORIDE 0.9 % IV SOLN: INTRAVENOUS | Status: DC

## 2012-09-02 MED ORDER — LEVETIRACETAM 500 MG PO TABS
500.0000 mg | ORAL_TABLET | Freq: Two times a day (BID) | ORAL | Status: DC
  Administered 2012-09-03 – 2012-09-04 (×4): 500 mg via ORAL
  Filled 2012-09-02 (×6): qty 1

## 2012-09-02 MED ORDER — SODIUM CHLORIDE 0.9 % IV SOLN
INTRAVENOUS | Status: DC
  Administered 2012-09-03: 01:00:00 via INTRAVENOUS
  Administered 2012-09-04: 75 mL/h via INTRAVENOUS
  Administered 2012-09-05 – 2012-09-06 (×2): via INTRAVENOUS

## 2012-09-02 MED ORDER — HYDROCODONE-ACETAMINOPHEN 7.5-325 MG PO TABS
1.0000 | ORAL_TABLET | ORAL | Status: DC | PRN
  Administered 2012-09-03 – 2012-09-07 (×16): 1 via ORAL
  Filled 2012-09-02 (×16): qty 1

## 2012-09-02 MED ORDER — CIPROFLOXACIN IN D5W 400 MG/200ML IV SOLN
400.0000 mg | Freq: Two times a day (BID) | INTRAVENOUS | Status: AC
  Administered 2012-09-03 – 2012-09-07 (×10): 400 mg via INTRAVENOUS
  Filled 2012-09-02 (×13): qty 200

## 2012-09-02 MED ORDER — CLOPIDOGREL BISULFATE 75 MG PO TABS
75.0000 mg | ORAL_TABLET | Freq: Every day | ORAL | Status: DC
  Administered 2012-09-03 – 2012-09-07 (×5): 75 mg via ORAL
  Filled 2012-09-02 (×8): qty 1

## 2012-09-02 MED ORDER — SODIUM CHLORIDE 0.9 % IV SOLN
INTRAVENOUS | Status: DC
  Administered 2012-09-02: 19:00:00 via INTRAVENOUS

## 2012-09-02 MED ORDER — SIMVASTATIN 20 MG PO TABS
20.0000 mg | ORAL_TABLET | Freq: Every day | ORAL | Status: DC
  Administered 2012-09-03 – 2012-09-06 (×5): 20 mg via ORAL
  Filled 2012-09-02 (×6): qty 1

## 2012-09-02 MED ORDER — HYDROCODONE-ACETAMINOPHEN 5-325 MG PO TABS
1.0000 | ORAL_TABLET | Freq: Once | ORAL | Status: AC
  Administered 2012-09-02: 1 via ORAL
  Filled 2012-09-02: qty 1

## 2012-09-02 MED ORDER — SENNOSIDES-DOCUSATE SODIUM 8.6-50 MG PO TABS
1.0000 | ORAL_TABLET | Freq: Every morning | ORAL | Status: DC
  Administered 2012-09-03 – 2012-09-07 (×3): 1 via ORAL
  Filled 2012-09-02 (×5): qty 1

## 2012-09-02 MED ORDER — LEVETIRACETAM 250 MG PO TABS: 250.0000 mg | ORAL_TABLET | Freq: Two times a day (BID) | ORAL | Status: DC

## 2012-09-02 MED ORDER — DIAZEPAM 5 MG PO TABS
5.0000 mg | ORAL_TABLET | Freq: Every day | ORAL | Status: DC
  Administered 2012-09-03 – 2012-09-06 (×5): 5 mg via ORAL
  Filled 2012-09-02 (×5): qty 1

## 2012-09-02 MED ORDER — ACETAMINOPHEN 325 MG PO TABS
650.0000 mg | ORAL_TABLET | ORAL | Status: DC | PRN
  Administered 2012-09-07: 325 mg via ORAL
  Administered 2012-09-07: 650 mg via ORAL
  Filled 2012-09-02: qty 2
  Filled 2012-09-02: qty 1

## 2012-09-02 NOTE — ED Notes (Signed)
[facility] is called for report, will call back to get report.

## 2012-09-02 NOTE — H&P (Signed)
PCP:   Evette Georges, MD    Chief Complaint:   Left facial droop and numbness.  HPI: Stacy Diaz is a 62 y.o. female   has a past medical history of HTN (hypertension); Hyperlipidemia; Meningioma (1999); Myocardial infarction (1985?); Headache; Arthritis; Stroke (08/25/2012); Spastic paraparesis; and Paresis of lower extremity (1999).   Presented with  2 day hx of Left facial droop and numbness as well as worsening confusion. Husband also noted that her head is drifting to the right.  She was just admitted to [facility] with possible CVA like symptoms  resulting in Left arm paralysis last week and was discharged on 1/15 to [facility]. MRI was an option since she have had hx of operation due to Meningioma. Since her surgery she have had trouble with weakness from waist down.  At her baseline she have had trouble ambulating for the past 4 years she can bear weight but needs help with transfer. Lately her legs have been non-weight bearing at all.  CT scan done today did not show any new findings.   Review of Systems:     Pertinent positives include: facial droop,  fatigue,  Constitutional:  No weight loss, night sweats, Fevers, chills, weight loss  HEENT:  No headaches, Difficulty swallowing,Tooth/dental problems,Sore throat,  No sneezing, itching, ear ache, nasal congestion, post nasal drip,  Cardio-vascular:  No chest pain, Orthopnea, PND, anasarca, dizziness, palpitations.no Bilateral lower extremity swelling  GI:  No heartburn, indigestion, abdominal pain, nausea, vomiting, diarrhea, change in bowel habits, loss of appetite, melena, blood in stool, hematemesis Resp:  no shortness of breath at rest. No dyspnea on exertion, No excess mucus, no productive cough, No non-productive cough, No coughing up of blood.No change in color of mucus.No wheezing. Skin:  no rash or lesions. No jaundice GU:  no dysuria, change in color of urine, no urgency or frequency. No straining to urinate.    No flank pain.  Musculoskeletal:  No joint pain or no joint swelling. No decreased range of motion. No back pain.  Psych:  No change in mood or affect. No depression or anxiety. No memory loss.  Neuro: no localizing neurological complaints, no tingling, no weakness, no double vision, no gait abnormality, no slurred speech, no confusion  Otherwise ROS are negative except for above, 10 systems were reviewed  Past Medical History: Past Medical History  Diagnosis Date  . HTN (hypertension)   . Hyperlipidemia   . Meningioma 1999  . Myocardial infarction 1985?  Marland Kitchen Headache     "not real frequent" (08/25/2012)  . Arthritis     "probably" (08/25/2012)  . Stroke 08/25/2012    "that's what they think I've had; sudden LUE weakness" (08/25/2012)  . Spastic paraparesis     S/P meningioma resection 1999/notes 08/25/2012  . Paresis of lower extremity 1999    BLE/notes 08/25/2012   Past Surgical History  Procedure Date  . Coronary angioplasty 1970's  . Cardiac catheterization 1970's  . Brain meningioma excision 1999     Medications: Prior to Admission medications   Medication Sig Start Date End Date Taking? Authorizing Provider  aspirin EC 325 MG EC tablet Take 1 tablet (325 mg total) by mouth daily. 08/30/12  Yes Joseph Art, DO  aspirin EC 81 MG tablet Take 81 mg by mouth once.   Yes Historical Provider, MD  baclofen (LIORESAL) 20 MG tablet Take 20 mg by mouth 4 (four) times daily.   Yes Historical Provider, MD  diazepam (VALIUM) 5 MG  tablet Take 5 mg by mouth at bedtime.   Yes Historical Provider, MD  HYDROcodone-acetaminophen (NORCO) 7.5-325 MG per tablet Take 1 tablet by mouth every 4 (four) hours as needed. For pain 08/30/12  Yes Joseph Art, DO  ibuprofen (ADVIL,MOTRIN) 200 MG tablet Take 800 mg by mouth every 8 (eight) hours as needed. For pain   Yes Historical Provider, MD  levETIRAcetam (KEPPRA) 250 MG tablet Take 1 tablet (250 mg total) by mouth 2 (two) times daily. 08/30/12  Yes  Jessica U Vann, DO  lidocaine (LIDODERM) 5 % Place 1 patch onto the skin daily. Remove & Discard patch within 12 hours or as directed by MD 08/30/12  Yes Joseph Art, DO  LORazepam (ATIVAN) 0.5 MG tablet Take 1 tablet (0.5 mg total) by mouth every 6 (six) hours as needed for anxiety. 08/30/12  Yes Joseph Art, DO  potassium chloride (K-DUR,KLOR-CON) 10 MEQ tablet Take 1 tablet (10 mEq total) by mouth 2 (two) times daily. 05/18/12  Yes Roderick Pee, MD  senna-docusate (SENOKOT-S) 8.6-50 MG per tablet Take 1 tablet by mouth every morning. 08/30/12  Yes Joseph Art, DO  simvastatin (ZOCOR) 20 MG tablet Take 1 tablet (20 mg total) by mouth at bedtime. 05/17/12  Yes Roderick Pee, MD  sodium phosphate (FLEET) 7-19 GM/118ML ENEM Place 1 enema rectally daily as needed. For constipation 08/30/12  Yes Joseph Art, DO  AMBULATORY NON FORMULARY MEDICATION Medication Name: Right and left double upright                               AFO's with double action                               Ankle joints 04/02/11   Roderick Pee, MD    Allergies:  No Known Allergies  Social History:  Bed bound Lives at [facility]   reports that she has quit smoking. She has never used smokeless tobacco. She reports that she drinks about 4.2 ounces of alcohol per week. She reports that she does not use illicit drugs.   Family History: family history is not on file.    Physical Exam: Patient Vitals for the past 24 hrs:  BP Temp Temp src Pulse Resp SpO2  09/02/12 2230 123/62 mmHg - - 79  19  98 %  09/02/12 2200 110/63 mmHg - - 77  17  98 %  09/02/12 2130 119/62 mmHg - - 83  20  100 %  09/02/12 2030 124/63 mmHg - - 74  19  100 %  09/02/12 1930 94/54 mmHg - - 67  17  98 %  09/02/12 1830 97/57 mmHg - - 66  19  98 %  09/02/12 1747 105/52 mmHg - - - 19  96 %  09/02/12 1700 113/64 mmHg - - 79  22  99 %  09/02/12 1639 - 99.2 F (37.3 C) - - - -  09/02/12 1615 104/66 mmHg - - 71  19  99 %  09/02/12 1545 118/56  mmHg - - 72  21  98 %  09/02/12 1530 104/63 mmHg - - 72  17  98 %  09/02/12 1511 109/62 mmHg 98.4 F (36.9 C) Oral 67  14  96 %    1. General:  in No Acute distress 2. Psychological: Alert and  Oriented 3. Head/ENT:   Moist   Mucous Membranes                          Head Non traumatic, neck supple                          Normal   Dentition 4. SKIN: normal   Skin turgor,  Skin clean Dry and intact no rash 5. Heart: Regular rate and rhythm no Murmur, Rub or gallop 6. Lungs: Clear to auscultation bilaterally, no wheezes or crackles anteriorly 7. Abdomen: Soft, non-tender, Non distended 8. Lower extremities: no clubbing, cyanosis, trace edema bilaterally 9. Neurologically diminished strength throughout, lower extremity seem to have slightly worsening weakness on the left but overall she is unable to raise her feet and has diminished plantar flexion, left  upper extremity no grip, right upper extremities preserved grip. , Left facial droop noted which is new 10. MSK: Normal range of motion  body mass index is unknown because there is no height or weight on file.   Labs on Admission:   [facility] 09/02/12 1730  NA 138  K 3.4*  CL 101  CO2 23  GLUCOSE 114*  BUN 12  CREATININE 0.51  CALCIUM 9.1  MG --  PHOS --    Basename 09/02/12 1730  AST 27  ALT 42*  ALKPHOS 56  BILITOT 0.4  PROT 6.7  ALBUMIN 3.4*   No results found for this basename: LIPASE:2,AMYLASE:2 in the last 72 hours  Basename 09/02/12 1730  WBC 11.0*  NEUTROABS 6.7  HGB 14.6  HCT 42.1  MCV 92.1  PLT 261   No results found for this basename: CKTOTAL:3,CKMB:3,CKMBINDEX:3,TROPONINI:3 in the last 72 hours No results found for this basename: TSH,T4TOTAL,FREET3,T3FREE,THYROIDAB in the last 72 hours No results found for this basename: VITAMINB12:2,FOLATE:2,FERRITIN:2,TIBC:2,IRON:2,RETICCTPCT:2 in the last 72 hours Lab Results  Component Value Date   HGBA1C 5.6 08/26/2012    The CrCl is unknown because both a  height and weight (above a minimum accepted value) are required for this calculation. ABG    Component Value Date/Time   TCO2 26 08/25/2012 1750     No results found for this basename: DDIMER     ECG HR 69 NSR No ischemic changes, no sig changes from ECG on 08/27/12  UA evidence of UTI   Cultures:    Component Value Date/Time   SDES URINE, CATHETERIZED 08/25/2012 1909   SPECREQUEST NONE 08/25/2012 1909   CULT ESCHERICHIA COLI 08/25/2012 1909   REPTSTATUS 08/27/2012 FINAL 08/25/2012 1909       Radiological Exams on Admission: Ct Head Wo Contrast  09/02/2012  *RADIOLOGY REPORT*  Clinical Data: Right-sided facial droop and left-sided weakness. Right-sided gaze.  CT HEAD WITHOUT CONTRAST  Technique:  Contiguous axial images were obtained from the base of the skull through the vertex without contrast.  Comparison: Multiple prior head CTs, most recent 08/26/2012 and 08/25/2012  Findings: Prior vertex craniotomy with multiple cerclage wires and surgical clips again noted.  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  There is streak artifact from the surgical clips.  Ventricles are stable in size.  No evidence of acute hemorrhage, hydrocephalus, mass lesion, or acute cortically based infarction.  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  No acute bony abnormality.  Visualized paranasal sinuses and mastoid air cells are clear.  IMPRESSION: Stable postoperative changes and bihemisphere  encephalomalacia. No acute intracranial abnormality identified.   Original Report Authenticated By: Britta Mccreedy, M.D.    Dg Chest Port 1 View  09/02/2012  *RADIOLOGY REPORT*  Clinical Data: Facial droop and headache  PORTABLE CHEST - 1 VIEW  Comparison: None  Findings: Low lung volumes.  Cardiac leads project over the chest. Heart, mediastinal, and hilar contours are within normal limits. The lungs are clear.  No visible pleural effusion.   Negative for pneumothorax.  Imaged bones are unremarkable.  IMPRESSION: Low lung volumes.  No acute findings.   Original Report Authenticated By: Britta Mccreedy, M.D.     Chart has been reviewed  Assessment/Plan  62 year old female with possible new CVA although could not be determined by CAT scan or MRI is not an option. We'll admit for workup and for recommendations from neurology  Present on Admission:  . HYPERTENSION hold home medications and she have had some blood pressures in emergency department  . UTI (lower urinary tract infection) - patient has been on Rocephin recently she had had a UTI a pansensitive Escherichia coli but still apparently continues to have an infection. Will treat with Cipro for now until he urine sensitivities of  . Paralysis of upper limb - possibly due to CVA neurology recommends switching to Plavix  . Facial droop - this is new repeat CT scan did not show any changes the MRI still not an option. Neurology will treat this is presumed CVA and start on Plavix. Given that she had possible partial seizures Keppra dose will be increased to 500 mg twice a day.    Prophylaxis: SCD Protonix  CODE STATUS: DNR/DNI as per pateint  Other plan as per orders.  I have spent a total of 65  min on this admission, discussed her care at length with family and neurology  Dalores Weger 09/02/2012, 10:09 PM

## 2012-09-02 NOTE — ED Notes (Signed)
Pt here via EMS with c/o right sided facial droop, left sided weakness, right side gaze witnessed by the husband yesterday at 2pm. Also c/o headache today but denies headache now. Vitals per EMS  BP 100/60, HR 70 regular, RR 16, O2 94% on room air, CBG 156. Pt states can't have MRI because she has metal plate in her skull.

## 2012-09-02 NOTE — Consult Note (Signed)
Reason for Consult:Worsening left sided symptoms  Referring Physician: Deretha Emory  CC: Worsening left sided weakness  HPI: Stacy Diaz is an 62 y.o. female that was recently hospitalized after presenting with acute onset left upper extremity plegia, left facial droop, LHH and left neglect.  Work up for stroke versus seizure was unremarkable.  Patient was discharged on ASA daily which she had been on previous to admission and Keppra which had been recently discontinued at 250mg  BID.  Patient has been observed by husband since discharge on the 15th.  He noted today that she was not using her left leg as well, was not attending to the left and seemed to be pocketing her food when eating. Has had episodes of left sided weakness for over a year.  These have been stereotypical in the past and are typically for a shorter period of time.      Past Medical History  Diagnosis Date  . HTN (hypertension)   . Hyperlipidemia   . Meningioma 1999  . Myocardial infarction 1985?  Marland Kitchen Headache     "not real frequent" (08/25/2012)  . Arthritis     "probably" (08/25/2012)  . Stroke 08/25/2012    "that's what they think I've had; sudden LUE weakness" (08/25/2012)  . Spastic paraparesis     S/P meningioma resection 1999/notes 08/25/2012  . Paresis of lower extremity 1999    BLE/notes 08/25/2012    Past Surgical History  Procedure Date  . Coronary angioplasty 1970's  . Cardiac catheterization 1970's  . Brain meningioma excision 1999    Family History  Problem Relation Age of Onset  . Breast cancer Mother   . Heart disease Father     Social History:  reports that she has quit smoking. She has never used smokeless tobacco. She reports that she drinks about 4.2 ounces of alcohol per week. She reports that she does not use illicit drugs.  No Known Allergies  Medications: I have reviewed the patient's current medications. Prior to Admission:  Current outpatient prescriptions:aspirin EC 325 MG EC tablet, Take 1  tablet (325 mg total) by mouth daily., Disp: 30 tablet, Rfl: ;  atenolol-chlorthalidone (TENORETIC) 50-25 MG per tablet, Take 1 tablet by mouth daily., Disp: , Rfl: ;  baclofen (LIORESAL) 20 MG tablet, Take 20 mg by mouth 4 (four) times daily., Disp: , Rfl: ;  diazepam (VALIUM) 5 MG tablet, Take 5 mg by mouth at bedtime., Disp: , Rfl:  HYDROcodone-acetaminophen (NORCO) 7.5-325 MG per tablet, Take 1 tablet by mouth every 4 (four) hours as needed. For pain, Disp: , Rfl: ;  ibuprofen (ADVIL,MOTRIN) 200 MG tablet, Take 800 mg by mouth every 8 (eight) hours as needed. For pain, Disp: , Rfl: ;  levETIRAcetam (KEPPRA) 250 MG tablet, Take 1 tablet (250 mg total) by mouth 2 (two) times daily., Disp: , Rfl:  lidocaine (LIDODERM) 5 %, Place 1 patch onto the skin daily. Remove & Discard patch within 12 hours or as directed by MD, Disp: 30 patch, Rfl: ;  LORazepam (ATIVAN) 0.5 MG tablet, Take 1 tablet (0.5 mg total) by mouth every 6 (six) hours as needed for anxiety., Disp: 30 tablet, Rfl: 0;  potassium chloride (K-DUR,KLOR-CON) 10 MEQ tablet, Take 1 tablet (10 mEq total) by mouth 2 (two) times daily., Disp: 200 tablet, Rfl: 3 senna-docusate (SENOKOT-S) 8.6-50 MG per tablet, Take 1 tablet by mouth every morning., Disp: , Rfl: ;  simvastatin (ZOCOR) 20 MG tablet, Take 1 tablet (20 mg total) by mouth at bedtime.,  Disp: 90 tablet, Rfl: 3;  sodium phosphate (FLEET) 7-19 GM/118ML ENEM, Place 1 enema rectally daily as needed. For constipation, Disp: , Rfl:  AMBULATORY NON FORMULARY MEDICATION, Medication Name: Right and left double upright                               AFO's with double action                               Ankle joints, Disp: 2 each, Rfl: prn  ROS: History obtained from the patient  General ROS: negative for - chills, fatigue, fever, night sweats, weight gain or weight loss Psychological ROS: negative for - behavioral disorder, hallucinations, memory difficulties, mood swings or suicidal ideation Ophthalmic  ROS: negative for - blurry vision, double vision, eye pain or loss of vision ENT ROS: negative for - epistaxis, nasal discharge, oral lesions, sore throat, tinnitus or vertigo Allergy and Immunology ROS: negative for - hives or itchy/watery eyes Hematological and Lymphatic ROS: negative for - bleeding problems, bruising or swollen lymph nodes Endocrine ROS: negative for - galactorrhea, hair pattern changes, polydipsia/polyuria or temperature intolerance Respiratory ROS: negative for - cough, hemoptysis, shortness of breath or wheezing Cardiovascular ROS: negative for - chest pain, dyspnea on exertion, edema or irregular heartbeat Gastrointestinal ROS: negative for - abdominal pain, diarrhea, hematemesis, nausea/vomiting or stool incontinence Genito-Urinary ROS: negative for - dysuria, hematuria, incontinence or urinary frequency/urgency Musculoskeletal ROS: muscle spasms Neurological ROS: as noted in HPI Dermatological ROS: negative for rash and skin lesion changes  Physical Examination: Blood pressure 123/62, pulse 79, temperature 99.2 F (37.3 C), temperature source Oral, resp. rate 19, SpO2 98.00%.  Neurologic Examination Mental Status: Alert, oriented, thought content appropriate.  Speech fluent without evidence of aphasia.  Able to follow 3 step commands without difficulty. Left neglect Cranial Nerves: II: Discs flat bilaterally; LHH, pupils equal, round, reactive to light and accommodation III,IV, VI: ptosis not present, extra-ocular motions intact bilaterally spontaneously but when asked to give full excursion does not go beyond midline. V,VII: left facial droop, facial light touch sensation normal bilaterally VIII: hearing normal bilaterally IX,X: gag reflex present XI: bilateral shoulder shrug XII: midline tongue extension Motor: Right : Upper extremity   5/5    Left:     Upper extremity   0/5  Lower extremity   2+/5     Lower extremity   2/5 Increased tone in the lower  extremities Sensory: Pinprick and light touch intact throughout, bilaterally Deep Tendon Reflexes: 2+ in the upper extremities and 3+ in the lower extremities. Plantars: Right: upgoing   Left: upgoing Cerebellar: Not performed secondary to hemiplegia and hemianopsia Gait: unable to be performed CV: pulses palpable throughout   Laboratory Studies:   Basic Metabolic Panel:  Lab 09/02/12 4696  NA 138  K 3.4*  CL 101  CO2 23  GLUCOSE 114*  BUN 12  CREATININE 0.51  CALCIUM 9.1  MG --  PHOS --    Liver Function Tests:  Lab 09/02/12 1730  AST 27  ALT 42*  ALKPHOS 56  BILITOT 0.4  PROT 6.7  ALBUMIN 3.4*   No results found for this basename: LIPASE:5,AMYLASE:5 in the last 168 hours No results found for this basename: AMMONIA:3 in the last 168 hours  CBC:  Lab 09/02/12 1730  WBC 11.0*  NEUTROABS 6.7  HGB 14.6  HCT 42.1  MCV 92.1  PLT 261    Cardiac Enzymes: No results found for this basename: CKTOTAL:5,CKMB:5,CKMBINDEX:5,TROPONINI:5 in the last 168 hours  BNP: No components found with this basename: POCBNP:5  CBG:  Lab 08/27/12 1658 08/27/12 1148  GLUCAP 90 93    Microbiology: Results for orders placed during the hospital encounter of 08/25/12  URINE CULTURE     Status: Normal   Collection Time   08/25/12  7:09 PM      Component Value Range Status Comment   Specimen Description URINE, CATHETERIZED   Final    Special Requests NONE   Final    Culture  Setup Time 08/25/2012 20:50   Final    Colony Count >=100,000 COLONIES/ML   Final    Culture ESCHERICHIA COLI   Final    Report Status 08/27/2012 FINAL   Final    Organism ID, Bacteria ESCHERICHIA COLI   Final     Coagulation Studies: No results found for this basename: LABPROT:5,INR:5 in the last 72 hours  Urinalysis:  Lab 09/02/12 2212 09/02/12 1955  COLORURINE YELLOW YELLOW  LABSPEC 1.016 1.025  PHURINE 5.5 6.0  GLUCOSEU NEGATIVE NEGATIVE  HGBUR MODERATE* NEGATIVE  BILIRUBINUR NEGATIVE NEGATIVE   KETONESUR NEGATIVE NEGATIVE  PROTEINUR 100* NEGATIVE  UROBILINOGEN 0.2 0.2  NITRITE NEGATIVE NEGATIVE  LEUKOCYTESUR LARGE* MODERATE*    Lipid Panel:     Component Value Date/Time   CHOL 164 08/26/2012 0749   TRIG 69 08/26/2012 0749   HDL 75 08/26/2012 0749   CHOLHDL 2.2 08/26/2012 0749   VLDL 14 08/26/2012 0749   LDLCALC 75 08/26/2012 0749    HgbA1C:  Lab Results  Component Value Date   HGBA1C 5.6 08/26/2012    Urine Drug Screen:     Component Value Date/Time   LABOPIA POSITIVE* 08/25/2012 1909   COCAINSCRNUR NONE DETECTED 08/25/2012 1909   LABBENZ POSITIVE* 08/25/2012 1909   AMPHETMU NONE DETECTED 08/25/2012 1909   THCU NONE DETECTED 08/25/2012 1909   LABBARB NONE DETECTED 08/25/2012 1909    Alcohol Level: No results found for this basename: ETH:2 in the last 168 hours  Imaging: Ct Head Wo Contrast  09/02/2012  *RADIOLOGY REPORT*  Clinical Data: Right-sided facial droop and left-sided weakness. Right-sided gaze.  CT HEAD WITHOUT CONTRAST  Technique:  Contiguous axial images were obtained from the base of the skull through the vertex without contrast.  Comparison: Multiple prior head CTs, most recent 08/26/2012 and 08/25/2012  Findings: Prior vertex craniotomy with multiple cerclage wires and surgical clips again noted.  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  There is streak artifact from the surgical clips.  Ventricles are stable in size.  No evidence of acute hemorrhage, hydrocephalus, mass lesion, or acute cortically based infarction.  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  No acute bony abnormality.  Visualized paranasal sinuses and mastoid air cells are clear.  IMPRESSION: Stable postoperative changes and bihemisphere encephalomalacia. No acute intracranial abnormality identified.   Original Report Authenticated By: Britta Mccreedy, M.D.    Dg Chest Port 1 View  09/02/2012  *RADIOLOGY REPORT*  Clinical  Data: Facial droop and headache  PORTABLE CHEST - 1 VIEW  Comparison: None  Findings: Low lung volumes.  Cardiac leads project over the chest. Heart, mediastinal, and hilar contours are within normal limits. The lungs are clear.  No visible pleural effusion.  Negative for pneumothorax.  Imaged bones are unremarkable.  IMPRESSION: Low lung volumes.  No acute findings.  Original Report Authenticated By: Britta Mccreedy, M.D.      Assessment/Plan: 62 year old female with a history of a meningioma removal and recent admission for left sided weakness who presents with further left sided complaints.  From review of the records from the previous hospitalization her exam is not much changed.  Her work up from the previous hospitalization was not indicative of any particular diagnosis.  She is unable to have a MRI and her head CT is unchanged.  I do suspect that her most recent presentation represented an acute infarct.  Work up was unremarkable.  Patient was maintained on an ASA daily.  She was restarted on her Keppra.  Can not rule out the possibility of a seizure. Patient does have a UTI and it is also possible that her infection has made her baseline deficits more prominent.    Recommendations: 1.  Stroke swallow screen 2.  Increase Keppra to 500mg  BID 3.  D/C ASA 4.  Start Plavix 75 mg daily 5.  Agree with treatment of UTI 6.  Agree with continued therapy.  Case discussed with husband and admitting physician at length.  Thana Farr, MD [facility] [phone] 09/02/2012, 11:10 PM

## 2012-09-02 NOTE — ED Provider Notes (Addendum)
History     CSN: [id number]  Arrival date & time 09/02/12  1511   First MD Initiated Contact with Patient 09/02/12 1637      Chief Complaint  Patient presents with  . Facial Droop  . Headache    (Consider location/radiation/quality/duration/timing/severity/associated sxs/prior treatment) Patient is a 62 y.o. female presenting with headaches. The history is provided by the patient, the spouse and the EMS personnel.  Headache  Pertinent negatives include no fever and no shortness of breath.   patient brought in by EMS. From past in place. Husband accompanied her. Concern was a new left-sided facial droop this started yesterday and left-sided facial numbness. And her bilateral leg weakness is worse. Patient had a meningioma removed 15 years ago has some residual deficits for that to function fairly well with leg braces.  Patient was admitted for questionable seizure or stroke symptoms January 9 and was discharged on January 14. Husband states that she is still not doing well and now we have these new symptoms. There was some question of a right-sided gaze witnessed by the husband yesterday. Supposedly was complaining of a headache earlier but that and is now colon. Blood gas in route by EMS was 156 room air saturation was 94%. Patient is noted during the last admission can't have an MRI because she has a metal plate in her skull.  Essentially husband feels it isn't getting any answers during the last admission and that she's getting worse.  Past Medical History  Diagnosis Date  . HTN (hypertension)   . Hyperlipidemia   . Meningioma 1999  . Myocardial infarction 1985?  Marland Kitchen Headache     "not real frequent" (08/25/2012)  . Arthritis     "probably" (08/25/2012)  . Stroke 08/25/2012    "that's what they think I've had; sudden LUE weakness" (08/25/2012)  . Spastic paraparesis     S/P meningioma resection 1999/notes 08/25/2012  . Paresis of lower extremity 1999    BLE/notes 08/25/2012    Past  Surgical History  Procedure Date  . Coronary angioplasty 1970's  . Cardiac catheterization 1970's  . Brain meningioma excision 1999    History reviewed. No pertinent family history.  History  Substance Use Topics  . Smoking status: Former Smoker -- 2.0 packs/day for 10 years  . Smokeless tobacco: Never Used     Comment: 08/25/2012 "quit smoking cigarettes in 1985 when I had my heart attack"  . Alcohol Use: 4.2 oz/week    7 Glasses of wine per week     Comment: 08/25/2012 "glass of wine qd"    OB History    Grav Para Term Preterm Abortions TAB SAB Ect Mult Living                  Review of Systems  Constitutional: Negative for fever.  HENT: Negative for congestion.   Respiratory: Negative for shortness of breath.   Cardiovascular: Negative for chest pain.  Gastrointestinal: Negative for abdominal pain.  Genitourinary: Negative for dysuria and hematuria.  Musculoskeletal: Negative for back pain.  Skin: Negative for rash.  Neurological: Positive for facial asymmetry, weakness, numbness and headaches.    Allergies  Review of patient's allergies indicates no known allergies.  Home Medications   Current Outpatient Rx  Name  Route  Sig  Dispense  Refill  . ASPIRIN 325 MG PO TBEC   Oral   Take 1 tablet (325 mg total) by mouth daily.   30 tablet      . ASPIRIN  EC 81 MG PO TBEC   Oral   Take 81 mg by mouth once.         Marland Kitchen BACLOFEN 20 MG PO TABS   Oral   Take 20 mg by mouth 4 (four) times daily.         Marland Kitchen DIAZEPAM 5 MG PO TABS   Oral   Take 5 mg by mouth at bedtime.         Marland Kitchen HYDROCODONE-ACETAMINOPHEN 7.5-325 MG PO TABS   Oral   Take 1 tablet by mouth every 4 (four) hours as needed. For pain         . IBUPROFEN 200 MG PO TABS   Oral   Take 800 mg by mouth every 8 (eight) hours as needed. For pain         . LEVETIRACETAM 250 MG PO TABS   Oral   Take 1 tablet (250 mg total) by mouth 2 (two) times daily.         Marland Kitchen LIDOCAINE 5 % EX PTCH    Transdermal   Place 1 patch onto the skin daily. Remove & Discard patch within 12 hours or as directed by MD   30 patch      . LORAZEPAM 0.5 MG PO TABS   Oral   Take 1 tablet (0.5 mg total) by mouth every 6 (six) hours as needed for anxiety.   30 tablet   0   . POTASSIUM CHLORIDE CRYS ER 10 MEQ PO TBCR   Oral   Take 1 tablet (10 mEq total) by mouth 2 (two) times daily.   200 tablet   3   . SENNOSIDES-DOCUSATE SODIUM 8.6-50 MG PO TABS   Oral   Take 1 tablet by mouth every morning.         Marland Kitchen SIMVASTATIN 20 MG PO TABS   Oral   Take 1 tablet (20 mg total) by mouth at bedtime.   90 tablet   3   . FLEET ENEMA 7-19 GM/118ML RE ENEM   Rectal   Place 1 enema rectally daily as needed. For constipation         . AMBULATORY NON FORMULARY MEDICATION      Medication Name: Right and left double upright                               AFO's with double action                               Ankle joints   2 each   prn     BP 124/63  Pulse 74  Temp 99.2 F (37.3 C) (Oral)  Resp 19  SpO2 100%  Physical Exam  Nursing note and vitals reviewed. Constitutional: She appears well-developed and well-nourished. No distress.  HENT:  Head: Normocephalic.  Mouth/Throat: Oropharynx is clear and moist.  Eyes: Conjunctivae normal are normal. Pupils are equal, round, and reactive to light.  Neck: Normal range of motion.  Cardiovascular: Normal rate, regular rhythm and normal heart sounds.   Pulmonary/Chest: Effort normal and breath sounds normal.  Abdominal: Soft. Bowel sounds are normal. There is no tenderness.  Musculoskeletal: She exhibits no edema.  Neurological:       Mild left-sided facial droop noted. Patient tends to want to turn her head more to the right. Rest of her neurologic exam seems to be  baseline as per the husband. However he feels she's gradually getting worse and has the new symptoms of numbness on the left side of her face and some droop on the left side.  Skin: Skin  is warm. No rash noted.    ED Course  Procedures (including critical care time)  Labs Reviewed  CBC WITH DIFFERENTIAL - Abnormal; Notable for the following:    WBC 11.0 (*)     Monocytes Absolute 1.3 (*)     All other components within normal limits  COMPREHENSIVE METABOLIC PANEL - Abnormal; Notable for the following:    Potassium 3.4 (*)     Glucose, Bld 114 (*)     Albumin 3.4 (*)     ALT 42 (*)     All other components within normal limits  URINALYSIS, ROUTINE W REFLEX MICROSCOPIC - Abnormal; Notable for the following:    APPearance HAZY (*)     Leukocytes, UA MODERATE (*)     All other components within normal limits  URINE MICROSCOPIC-ADD ON - Abnormal; Notable for the following:    Squamous Epithelial / LPF MANY (*)     Bacteria, UA FEW (*)     All other components within normal limits  URINE CULTURE   Ct Head Wo Contrast  09/02/2012  *RADIOLOGY REPORT*  Clinical Data: Right-sided facial droop and left-sided weakness. Right-sided gaze.  CT HEAD WITHOUT CONTRAST  Technique:  Contiguous axial images were obtained from the base of the skull through the vertex without contrast.  Comparison: Multiple prior head CTs, most recent 08/26/2012 and 08/25/2012  Findings: Prior vertex craniotomy with multiple cerclage wires and surgical clips again noted.  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  There is streak artifact from the surgical clips.  Ventricles are stable in size.  No evidence of acute hemorrhage, hydrocephalus, mass lesion, or acute cortically based infarction.  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  No acute bony abnormality.  Visualized paranasal sinuses and mastoid air cells are clear.  IMPRESSION: Stable postoperative changes and bihemisphere encephalomalacia. No acute intracranial abnormality identified.   Original Report Authenticated By: Britta Mccreedy, M.D.    Dg Chest Port 1  View  09/02/2012  *RADIOLOGY REPORT*  Clinical Data: Facial droop and headache  PORTABLE CHEST - 1 VIEW  Comparison: None  Findings: Low lung volumes.  Cardiac leads project over the chest. Heart, mediastinal, and hilar contours are within normal limits. The lungs are clear.  No visible pleural effusion.  Negative for pneumothorax.  Imaged bones are unremarkable.  IMPRESSION: Low lung volumes.  No acute findings.   Original Report Authenticated By: Britta Mccreedy, M.D.    Results for orders placed during the hospital encounter of 09/02/12  CBC WITH DIFFERENTIAL      Component Value Range   WBC 11.0 (*) 4.0 - 10.5 K/uL   RBC 4.57  3.87 - 5.11 MIL/uL   Hemoglobin 14.6  12.0 - 15.0 g/dL   HCT 78.2  95.6 - 21.3 %   MCV 92.1  78.0 - 100.0 fL   MCH 31.9  26.0 - 34.0 pg   MCHC 34.7  30.0 - 36.0 g/dL   RDW 08.6  57.8 - 46.9 %   Platelets 261  150 - 400 K/uL   Neutrophils Relative 61  43 - 77 %   Neutro Abs 6.7  1.7 - 7.7 K/uL   Lymphocytes Relative 25  12 - 46 %   Lymphs  Abs 2.7  0.7 - 4.0 K/uL   Monocytes Relative 12  3 - 12 %   Monocytes Absolute 1.3 (*) 0.1 - 1.0 K/uL   Eosinophils Relative 2  0 - 5 %   Eosinophils Absolute 0.2  0.0 - 0.7 K/uL   Basophils Relative 0  0 - 1 %   Basophils Absolute 0.0  0.0 - 0.1 K/uL  COMPREHENSIVE METABOLIC PANEL      Component Value Range   Sodium 138  135 - 145 mEq/L   Potassium 3.4 (*) 3.5 - 5.1 mEq/L   Chloride 101  96 - 112 mEq/L   CO2 23  19 - 32 mEq/L   Glucose, Bld 114 (*) 70 - 99 mg/dL   BUN 12  6 - 23 mg/dL   Creatinine, Ser 4.09  0.50 - 1.10 mg/dL   Calcium 9.1  8.4 - 81.1 mg/dL   Total Protein 6.7  6.0 - 8.3 g/dL   Albumin 3.4 (*) 3.5 - 5.2 g/dL   AST 27  0 - 37 U/L   ALT 42 (*) 0 - 35 U/L   Alkaline Phosphatase 56  39 - 117 U/L   Total Bilirubin 0.4  0.3 - 1.2 mg/dL   GFR calc non Af Amer >90  >90 mL/min   GFR calc Af Amer >90  >90 mL/min  URINALYSIS, ROUTINE W REFLEX MICROSCOPIC      Component Value Range   Color, Urine YELLOW   YELLOW   APPearance HAZY (*) CLEAR   Specific Gravity, Urine 1.025  1.005 - 1.030   pH 6.0  5.0 - 8.0   Glucose, UA NEGATIVE  NEGATIVE mg/dL   Hgb urine dipstick NEGATIVE  NEGATIVE   Bilirubin Urine NEGATIVE  NEGATIVE   Ketones, ur NEGATIVE  NEGATIVE mg/dL   Protein, ur NEGATIVE  NEGATIVE mg/dL   Urobilinogen, UA 0.2  0.0 - 1.0 mg/dL   Nitrite NEGATIVE  NEGATIVE   Leukocytes, UA MODERATE (*) NEGATIVE  URINE MICROSCOPIC-ADD ON      Component Value Range   Squamous Epithelial / LPF MANY (*) RARE   WBC, UA 21-50  <3 WBC/hpf   Bacteria, UA FEW (*) RARE   Urine-Other MUCOUS PRESENT       1. Facial droop       MDM   Discussed with on-call neurology. They will consult husband really wants readmission to figure out what's going on. This feels she's getting worse. Objectively and based on labs no significant findings urinalysis markedly contaminated not consistent with urinary tract infection head CT chest x-ray without significant abnormalities. Patient not a candidate for MRI due to the metal in her skull.  She is followed by Dr. Alonza Smoker would normally be admitted by the triad hospitalist.  Discussed with the admitting hospitalist team. They will admit temporary middle orders put in for observation. Will go ahead and repeat the urine which was contaminated with in and out cath sent for another urinalysis and urine culture. This possibly could be the cause of her changes. Also discussed with Neurology who will consult.        Shelda Jakes, MD 09/02/12 9147  Shelda Jakes, MD 09/04/12 1340

## 2012-09-03 DIAGNOSIS — D332 Benign neoplasm of brain, unspecified: Secondary | ICD-10-CM

## 2012-09-03 DIAGNOSIS — G8320 Monoplegia of upper limb affecting unspecified side: Secondary | ICD-10-CM

## 2012-09-03 LAB — GLUCOSE, CAPILLARY: Glucose-Capillary: 87 mg/dL (ref 70–99)

## 2012-09-03 LAB — LIPID PANEL
Cholesterol: 138 mg/dL (ref 0–200)
HDL: 65 mg/dL (ref >39)
Total CHOL/HDL Ratio: 2.1 RATIO
Triglycerides: 116 mg/dL (ref <150)
VLDL: 23 mg/dL (ref 0–40)

## 2012-09-03 MED ORDER — LORAZEPAM 2 MG/ML IJ SOLN
INTRAMUSCULAR | Status: AC
  Administered 2012-09-03: 19:00:00
  Filled 2012-09-03: qty 1

## 2012-09-03 MED ORDER — DEXTROSE 5 % IV SOLN
1000.0000 mg | INTRAVENOUS | Status: AC
  Administered 2012-09-03: 1000 mg via INTRAVENOUS
  Filled 2012-09-03: qty 10

## 2012-09-03 MED ORDER — SODIUM CHLORIDE 0.9 % IV SOLN
250.0000 mg | INTRAVENOUS | Status: AC
  Administered 2012-09-03: 250 mg via INTRAVENOUS
  Filled 2012-09-03: qty 2.5

## 2012-09-03 MED ORDER — DOCUSATE SODIUM 100 MG PO CAPS
100.0000 mg | ORAL_CAPSULE | Freq: Two times a day (BID) | ORAL | Status: DC
  Administered 2012-09-03 – 2012-09-07 (×6): 100 mg via ORAL
  Filled 2012-09-03 (×9): qty 1

## 2012-09-03 MED ORDER — BISACODYL 5 MG PO TBEC
5.0000 mg | DELAYED_RELEASE_TABLET | Freq: Every day | ORAL | Status: DC | PRN
  Filled 2012-09-03 (×2): qty 1

## 2012-09-03 NOTE — Code Documentation (Signed)
Called to assist with patient reported unresponsive.  On arrival patient with forced gaze deviation to the left - eyes deviated upward - resps reg and unlabored - blinks to threat - pupils 4mm ERL - BP 154/87  HR 105 RR 22  O2 sats 98% on RA - upper airway clear with good air movement - CBG 91. Family was with patient = they report she was alert -stated her neck hurt and that she needed to go to bathroom - they stepped out to get RN and when returned momentarily patient would not respond. Rey RN on phone with MD Dr. Philip Aspen - order for Ativan per MD - 2 mg IV given per RN Rey - as Ativan was given - patient began to have twitching of left eye and face - progressed to tongue twitching then to left arm - eyes remain deviated to left - no right side involvement - Dr. Gwenlyn Perking present - stat request for IV Keppra and Depakote.  2nd IV started - #20 angio right hand times one attempt.  Clonic movements lasted 7 1/2 minutes - no airway compromise noted during event - BS clear - upper airway clear - maintaining O2 sats - Bp 138/76  HR 105 RR 24 O2 sats 99%.  3 minutes post cessation of seizure activity - patient began to focus and respond to speech.  Dr. Cyril Mourning present with neuro.  Patient to move to SDU.  VSS.  Call as needed.

## 2012-09-03 NOTE — Evaluation (Signed)
Occupational Therapy Evaluation Patient Details Name: Stacy Diaz MRN: [id number] DOB: [birth year] Today's Date: 09/03/2012 Time: 9528-4132    OT Assessment / Plan / Recommendation Clinical Impression  Pt presents to OT after readmission to hospital from SNF with exacerbation in symptoms.  Pt with significant vision impairment and decreased use of LUE.  Pt overall total A with mobility .  Pt talkative and oriented.  Pt will benefit from skilled OT to increase I with ADL activity and return to PLOF    OT Assessment  Patient needs continued OT Services    Follow Up Recommendations  CIR       Equipment Recommendations  3 in 1 bedside comode;Wheelchair (measurements OT);Wheelchair cushion (measurements OT)    Recommendations for Other Services Rehab consult  Frequency  Min 3X/week    Precautions / Restrictions Precautions Precautions: Fall       ADL  Eating/Feeding: Performed;+1 Total assistance Where Assessed - Eating/Feeding: Bed level Grooming: Performed;Maximal assistance Where Assessed - Grooming: Supine, head of bed up Transfers/Ambulation Related to ADLs: Pt in bed with head turned to R .  Worked for 10 min on pt working toward midline without success. Pt fatigued and appeared frustrated.  Pt not able to see food on tray or find washcloth without max- total A handss on asssit ADL Comments: Pt supine on arrival with head rotatated to the Rt side and attending to the right side. Pt demonstates Lt side homonymous hemianopsia see vision assessment. pt with lt eye esotropia (inward rotation) Pt requesting bed pan and requires total (A) for hygiene and log rolling. pt required total+2 pt 20% to get onto bed pan. pt long rolled onto Rt side in attempt to decrease tone. pt with LT UE decrease tone with side lying. Pt provided PROM  using PNF D2 flexion 20 reps. pt with digits in extension after PROM and side lying. Pt reports decreased pain in Lt UE. Pt no active ROM noted.     OT  Diagnosis: Disturbance of vision;Paresis;Generalized weakness  OT Problem List: Decreased strength;Decreased activity tolerance;Impaired balance (sitting and/or standing);Decreased safety awareness;Decreased knowledge of use of DME or AE;Decreased knowledge of precautions;Impaired tone;Impaired UE functional use;Decreased range of motion;Impaired vision/perception OT Treatment Interventions: Self-care/ADL training;Neuromuscular education;DME and/or AE instruction;Therapeutic activities;Patient/family education;Balance training   OT Goals Acute Rehab OT Goals OT Goal Formulation: With patient/family Time For Goal Achievement: 09/09/12 Potential to Achieve Goals: Good ADL Goals Pt Will Perform Grooming: with set-up;Sitting, chair;Supported ADL Goal: Grooming - Progress: Goal set today Pt Will Perform Upper Body Bathing: Sitting at sink;Sitting, chair;Supported;with min assist ADL Goal: Upper Body Bathing - Progress: Goal set today Pt Will Perform Upper Body Dressing: with min assist;Sitting, chair;Supported ADL Goal: Patent attorney - Progress: Goal set today Pt Will Transfer to Toilet: with max assist;Stand pivot transfer;3-in-1 ADL Goal: Toilet Transfer - Progress: Goal set today Miscellaneous OT Goals Miscellaneous OT Goal #1: Pt will sit at EOB with min guard  (A) for 5 minutes as precursor to adls OT Goal: Miscellaneous Goal #1 - Progress: Goal set today Miscellaneous OT Goal #2: Pt will locate items in her enviroment with mod A in which focusing on items at midline, as well as tracking to left to increase I with ADL activity.   OT Goal: Miscellaneous Goal #2 - Progress: Goal set today  Visit Information  Last OT Received On: 09/03/12       Prior Functioning     Home Living Lives With: Spouse Available Help at Discharge:  Family;Available 24 hours/day Type of Home: House Home Access: Other (comment) Home Layout: One level Bathroom Toilet: Standard Bathroom Accessibility:  No Home Adaptive Equipment: Bedside commode/3-in-1;Other (comment) (Jazzy power chair) Additional Comments: spastic BIL LE that allow for stand pivot into w/c Prior Function Level of Independence: Needs assistance Needs Assistance: Dressing;Toileting;Meal Prep;Light Housekeeping;Transfers Dressing: Maximal Toileting: Maximal Meal Prep: Total Light Housekeeping: Total Able to Take Stairs?: No Driving: No Vocation: On disability Communication Communication: No difficulties Dominant Hand: Right         Vision/Perception Vision - Assessment Alignment/Gaze Preference: Head turned;Gaze right Visual Fields: Left homonymous hemianopsia Additional Comments: Pt not able to track past midline to the right and was only able to get to mid line one time for 2-3 seconds during vision testing with OT   Cognition  Overall Cognitive Status: Appears within functional limits for tasks assessed/performed Arousal/Alertness: Awake/alert Orientation Level: Oriented X4 / Intact Behavior During Session: [facility] for tasks performed    Extremity/Trunk Assessment Right Upper Extremity Assessment RUE ROM/Strength/Tone: Within functional levels RUE Sensation: WFL - Light Touch RUE Coordination: WFL - gross motor Left Upper Extremity Assessment LUE ROM/Strength/Tone: Deficits LUE ROM/Strength/Tone Deficits: Tone present MOdified ashworth scale 2 out 4. Pt provided PNF D2 to assist with decrease tone. LUE Sensation: Deficits     Mobility Bed Mobility Bed Mobility: Rolling Right;Rolling Left Rolling Right: 1: +1 Total assist Details for Bed Mobility Assistance: Pt declined sitting EOB or getting OOB with OT. Transfers Transfers: Not assessed              End of Session OT - End of Session Activity Tolerance: Patient limited by fatigue Patient left: in bed;with call bell/phone within reach Nurse Communication: Mobility status;Precautions  GO     Alba Cory 09/03/2012, 9:19 AM

## 2012-09-03 NOTE — Progress Notes (Addendum)
Called by RN to evaluate patient due to acute unresponsiveness state and left lateral gaze with subsequent left facial and arm twitching. At my arrival patient unresponsive and actively having symptoms despite 2mg  of ativan given. Patient was protecting her airways and VS were stable. 1G of depakote IV and 250mg  IV keppra given with subsequent abortion of seizure activity. Patient with subsequent mild post ictal state. Whole event lasted about 7-9 minutes; and then restart talking, AAOX3, able to follow commands and with appropriate interaction. Neurologist on call called and at bedside who recommend to ordered depakote level, EEG and close monitoring for further seizure. Dr. Thad Ranger will also follow on patient and provide further recommendations.   Patient transfer to stepdown.  Stacy Diaz [phone]

## 2012-09-03 NOTE — Progress Notes (Signed)
[facility]             Progress Note   Subjective: Left neglect, left arm paresis. C/o being constipated.  Objective: Vital signs in last 24 hours: Temp:  [98.4 F (36.9 C)-99.2 F (37.3 C)] 99 F (37.2 C) (01/18 0400) Pulse Rate:  [66-86] 86  (01/18 0400) Resp:  [14-22] 14  (01/18 0400) BP: (94-134)/(52-76) 134/76 mmHg (01/18 0400) SpO2:  [96 %-100 %] 96 % (01/18 0400) Weight change:     Intake/Output from previous day:       Physical Exam: General: Alert, awake, oriented x3. HEENT: No bruits, no goiter. Heart: Regular rate and rhythm, without murmurs, rubs, gallops. Lungs: Clear to auscultation bilaterally. Abdomen: Soft, nontender, nondistended, positive bowel sounds. Extremities: No clubbing cyanosis or edema with positive pedal pulses.    Lab Results: Basic Metabolic Panel:  Basename 09/02/12 1730  NA 138  K 3.4*  CL 101  CO2 23  GLUCOSE 114*  BUN 12  CREATININE 0.51  CALCIUM 9.1  MG --  PHOS --   Liver Function Tests:  Basename 09/02/12 1730  AST 27  ALT 42*  ALKPHOS 56  BILITOT 0.4  PROT 6.7  ALBUMIN 3.4*   CBC:  Basename 09/02/12 1730  WBC 11.0*  NEUTROABS 6.7  HGB 14.6  HCT 42.1  MCV 92.1  PLT 261   CBG:  Basename 09/03/12 0759  GLUCAP 99   Fasting Lipid Panel:  Basename 09/03/12 0620  CHOL 138  HDL 65  LDLCALC 50  TRIG 116  CHOLHDL 2.1  LDLDIRECT --   Urine Drug Screen: Drugs of Abuse     Component Value Date/Time   LABOPIA POSITIVE* 08/25/2012 1909   COCAINSCRNUR NONE DETECTED 08/25/2012 1909   LABBENZ POSITIVE* 08/25/2012 1909   AMPHETMU NONE DETECTED 08/25/2012 1909   THCU NONE DETECTED 08/25/2012 1909   LABBARB NONE DETECTED 08/25/2012 1909    Urinalysis:  Basename 09/02/12 2212 09/02/12 1955  COLORURINE YELLOW YELLOW  LABSPEC 1.016 1.025  PHURINE 5.5 6.0  GLUCOSEU NEGATIVE NEGATIVE  HGBUR MODERATE* NEGATIVE  BILIRUBINUR NEGATIVE NEGATIVE  KETONESUR NEGATIVE NEGATIVE  PROTEINUR 100* NEGATIVE    UROBILINOGEN 0.2 0.2  NITRITE NEGATIVE NEGATIVE  LEUKOCYTESUR LARGE* MODERATE*    Recent Results (from the past 240 hour(s))  URINE CULTURE     Status: Normal   Collection Time   08/25/12  7:09 PM      Component Value Range Status Comment   Specimen Description URINE, CATHETERIZED   Final    Special Requests NONE   Final    Culture  Setup Time 08/25/2012 20:50   Final    Colony Count >=100,000 COLONIES/ML   Final    Culture ESCHERICHIA COLI   Final    Report Status 08/27/2012 FINAL   Final    Organism ID, Bacteria ESCHERICHIA COLI   Final     Studies/Results: Ct Head Wo Contrast  09/02/2012  *RADIOLOGY REPORT*  Clinical Data: Right-sided facial droop and left-sided weakness. Right-sided gaze.  CT HEAD WITHOUT CONTRAST  Technique:  Contiguous axial images were obtained from the base of the skull through the vertex without contrast.  Comparison: Multiple prior head CTs, most recent 08/26/2012 and 08/25/2012  Findings: Prior vertex craniotomy with multiple cerclage wires and surgical clips again noted.  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  There is streak artifact from the surgical clips.  Ventricles are stable in size.  No evidence of acute hemorrhage, hydrocephalus, mass  lesion, or acute cortically based infarction.  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  No acute bony abnormality.  Visualized paranasal sinuses and mastoid air cells are clear.  IMPRESSION: Stable postoperative changes and bihemisphere encephalomalacia. No acute intracranial abnormality identified.   Original Report Authenticated By: Britta Mccreedy, M.D.    Dg Chest Port 1 View  09/02/2012  *RADIOLOGY REPORT*  Clinical Data: Facial droop and headache  PORTABLE CHEST - 1 VIEW  Comparison: None  Findings: Low lung volumes.  Cardiac leads project over the chest. Heart, mediastinal, and hilar contours are within normal limits. The lungs are clear.   No visible pleural effusion.  Negative for pneumothorax.  Imaged bones are unremarkable.  IMPRESSION: Low lung volumes.  No acute findings.   Original Report Authenticated By: Britta Mccreedy, M.D.     Medications: Scheduled Meds:   . aspirin EC  325 mg Oral Daily  . baclofen  20 mg Oral TID  . ciprofloxacin  400 mg Intravenous Q12H  . clopidogrel  75 mg Oral Q breakfast  . diazepam  5 mg Oral QHS  . levETIRAcetam  500 mg Oral BID  . potassium chloride  10 mEq Oral BID  . senna-docusate  1 tablet Oral q morning - 10a  . simvastatin  20 mg Oral QHS   Continuous Infusions:   . sodium chloride 75 mL/hr at 09/03/12 0055   PRN Meds:.acetaminophen, HYDROcodone-acetaminophen, LORazepam, sodium phosphate  Assessment/Plan:  Active Problems:  HYPERTENSION  Paralysis of upper limb  UTI (lower urinary tract infection)  Facial droop   Acute CVA -Unable to do MRI. -Repeat CT unchanged, but neurology believes most likely acute infarct. -Jonne Ply has been upgraded to plavix. -Keppra has been increased to 500 BID as seizure cannot be ruled out. -Plan is to return to SNF once medically ready...suspect in 24-48 hours.  UTI -Continue cipro pending cx data.  HTN -Well controlled.  Constipation -Colace/stool softener   Time spent coordinating care: 35 minutes.   LOS: 1 day   [facility] Pager: [phone] 09/03/2012, 9:59 AM

## 2012-09-03 NOTE — Evaluation (Signed)
Physical Therapy Evaluation Patient Details Name: Stacy Diaz MRN: [id number] DOB: 03-26-1951 Today's Date: 09/03/2012 Time: 0865-7846 PT Time Calculation (min): 19 min  PT Assessment / Plan / Recommendation Clinical Impression  Patient is a 62 yo female admitted with LUE paresis, left facial numbness, and left neglect.  Patient with history of bil. spastic paraparesis.  Patient will benefit from acute PT to increase activity tolerance and maximize independence prior to discharge.  Recommend return to SNF (patient and husband requesting [name]) for continued therapy.    PT Assessment  Patient needs continued PT services    Follow Up Recommendations  SNF    Does the patient have the potential to tolerate intense rehabilitation      Barriers to Discharge None      Equipment Recommendations  None recommended by PT    Recommendations for Other Services     Frequency Min 3X/week    Precautions / Restrictions Precautions Precautions: Fall Restrictions Weight Bearing Restrictions: No   Pertinent Vitals/Pain       Mobility  Bed Mobility Bed Mobility: Rolling Right;Rolling Left Rolling Right: 1: +1 Total assist Rolling Left: 1: +1 Total assist;With rail Details for Bed Mobility Assistance: Assist to manage LUE rolling to right.  Difficulty finding rail with RUE to roll to left.  Worked on turning head to midline/left.  Patient with right gaze.  Able to turn head to midline only.  Eyes able to move from right to almost midline with constant cuing.  Patient reports oscillopsia making tracking objects difficult. Transfers Transfers: Not assessed (Declined) Modified Rankin (Stroke Patients Only) Pre-Morbid Rankin Score: Severe disability Modified Rankin: Severe disability           PT Diagnosis: Generalized weakness;Hemiplegia non-dominant side  PT Problem List: Decreased strength;Decreased range of motion;Decreased activity tolerance;Decreased balance;Decreased  mobility;Impaired sensation;Impaired tone PT Treatment Interventions: Functional mobility training;Therapeutic activities;Therapeutic exercise;Balance training;Neuromuscular re-education;Patient/family education   PT Goals Acute Rehab PT Goals PT Goal Formulation: With patient Time For Goal Achievement: 09/17/12 Potential to Achieve Goals: Fair Pt will Roll Supine to Right Side: with mod assist PT Goal: Rolling Supine to Right Side - Progress: Goal set today Pt will Roll Supine to Left Side: with mod assist PT Goal: Rolling Supine to Left Side - Progress: Goal set today Pt will go Supine/Side to Sit: with mod assist PT Goal: Supine/Side to Sit - Progress: Goal set today Pt will Sit at Edge of Bed: with mod assist;3-5 min;with unilateral upper extremity support PT Goal: Sit at Edge Of Bed - Progress: Goal set today Pt will Transfer Bed to Chair/Chair to Bed: with max assist PT Transfer Goal: Bed to Chair/Chair to Bed - Progress: Goal set today  Visit Information  Last PT Received On: 09/03/12 Assistance Needed: +2    Subjective Data  Subjective: "When I look at things, they jump up and down" Patient Stated Goal: To get in and out of my chair   Prior Functioning  Home Living Lives With: Spouse Available Help at Discharge: Skilled Nursing Facility (Wants to go to [facility]) Type of Home: House Home Access: Other (comment) Home Layout: One level Bathroom Toilet: Standard Bathroom Accessibility: No Home Adaptive Equipment: Bedside commode/3-in-1 (Power wheelchair) Additional Comments: spastic BIL LE that allow for stand pivot into w/c Prior Function Level of Independence: Needs assistance Needs Assistance: Dressing;Toileting;Meal Prep;Light Housekeeping;Transfers Dressing: Maximal Toileting: Maximal Meal Prep: Total Light Housekeeping: Total Transfer Assistance: Mod to max assist Able to Take Stairs?: No Driving: No Vocation:  On disability Comments: Wears AFO's to  transfer (per prior charting) Communication Communication: No difficulties Dominant Hand: Right    Cognition  Overall Cognitive Status: Appears within functional limits for tasks assessed/performed Arousal/Alertness: Awake/alert Orientation Level: Oriented X4 / Intact Behavior During Session: [facility] for tasks performed Cognition - Other Comments: Able to identify new deficits    Extremity/Trunk Assessment Right Upper Extremity Assessment RUE ROM/Strength/Tone: WFL for tasks assessed RUE Sensation: WFL - Light Touch RUE Coordination: WFL - gross motor Left Upper Extremity Assessment LUE ROM/Strength/Tone: Deficits LUE ROM/Strength/Tone Deficits: No movement noted.  See OT note LUE Sensation: Deficits Right Lower Extremity Assessment RLE ROM/Strength/Tone: Deficits RLE ROM/Strength/Tone Deficits: spasic plegia with trace gross extension bilaterally Left Lower Extremity Assessment LLE ROM/Strength/Tone: Deficits LLE ROM/Strength/Tone Deficits: spasic plegia with trace gross extension bilaterally   Balance Balance Balance Assessed: No  End of Session PT - End of Session Activity Tolerance: Patient tolerated treatment well Patient left: in bed;with call bell/phone within reach;with family/visitor present Nurse Communication: Mobility status;Need for lift equipment  GP Functional Assessment Tool Used: Clinical judgement Functional Limitation: Changing and maintaining body position Changing and Maintaining Body Position Current Status (Z6109): At least 80 percent but less than 100 percent impaired, limited or restricted Changing and Maintaining Body Position Goal Status (U0454): At least 40 percent but less than 60 percent impaired, limited or restricted   Vena Austria 09/03/2012, 12:25 PM Durenda Hurt. Renaldo Fiddler, [facility] Acute Rehab Services Pager [phone]

## 2012-09-03 NOTE — Progress Notes (Signed)
NEURO HOSPITALIST PROGRESS NOTE   SUBJECTIVE:                                                                                                                        Patient reported as having a seizure that is described as twitching of the right side with eye deviation to the left and nystagmoid eye movements. The episode lasted for about 7 minutes and stopped after receiving 1 mg ativan, 1 gram IV Depacon, and keppra 250 mg IV. When I examined her she was able to follow commands and was very coherent taking to me. She was not confused afterwards.   OBJECTIVE:                                                                                                                           Vital signs in last 24 hours: Temp:  [98.2 F (36.8 C)-99 F (37.2 C)] 98.2 F (36.8 C) (01/18 1410) Pulse Rate:  [67-86] 85  (01/18 1410) Resp:  [14-20] 16  (01/18 1410) BP: (94-134)/(54-76) 118/71 mmHg (01/18 1410) SpO2:  [96 %-100 %] 99 % (01/18 1410)  Intake/Output from previous day:   Intake/Output this shift: Total I/O In: 600 [P.O.:600] Out: -  Nutritional status: Cardiac  Past Medical History  Diagnosis Date  . HTN (hypertension)   . Hyperlipidemia   . Meningioma 1999  . Myocardial infarction 1985?  Marland Kitchen Headache     "not real frequent" (08/25/2012)  . Arthritis     "probably" (08/25/2012)  . Stroke 08/25/2012    "that's what they think I've had; sudden LUE weakness" (08/25/2012)  . Spastic paraparesis     S/P meningioma resection 1999/notes 08/25/2012  . Paresis of lower extremity 1999    BLE/notes 08/25/2012    Neurologic ROS negative with exception of above. Musculoskeletal ROS: deferred.  Neurologic Exam:  She is alert, awake, oriented x4. Follows commands properly and has no language or speech impediment. CN 2-12: unremarkable exam after regaining consciousness rather quickly. Motor: left HP that is described as an old finding. Sensory: localizes  pain. DTR's: 2+ all over. Plantar responses:upgoing bilaterally. Coordination and gait: no tested.  Lab Results: Lab Results  Component Value Date/Time   CHOL 138 09/03/2012  6:20 AM   Lipid Panel  Basename 09/03/12 0620  CHOL 138  TRIG 116  HDL 65  CHOLHDL 2.1  VLDL 23  LDLCALC 50    Studies/Results: Ct Head Wo Contrast  09/02/2012  *RADIOLOGY REPORT*  Clinical Data: Right-sided facial droop and left-sided weakness. Right-sided gaze.  CT HEAD WITHOUT CONTRAST  Technique:  Contiguous axial images were obtained from the base of the skull through the vertex without contrast.  Comparison: Multiple prior head CTs, most recent 08/26/2012 and 08/25/2012  Findings: Prior vertex craniotomy with multiple cerclage wires and surgical clips again noted.  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  There is streak artifact from the surgical clips.  Ventricles are stable in size.  No evidence of acute hemorrhage, hydrocephalus, mass lesion, or acute cortically based infarction.  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  No acute bony abnormality.  Visualized paranasal sinuses and mastoid air cells are clear.  IMPRESSION: Stable postoperative changes and bihemisphere encephalomalacia. No acute intracranial abnormality identified.   Original Report Authenticated By: Britta Mccreedy, M.D.    Dg Chest Port 1 View  09/02/2012  *RADIOLOGY REPORT*  Clinical Data: Facial droop and headache  PORTABLE CHEST - 1 VIEW  Comparison: None  Findings: Low lung volumes.  Cardiac leads project over the chest. Heart, mediastinal, and hilar contours are within normal limits. The lungs are clear.  No visible pleural effusion.  Negative for pneumothorax.  Imaged bones are unremarkable.  IMPRESSION: Low lung volumes.  No acute findings.   Original Report Authenticated By: Britta Mccreedy, M.D.     MEDICATIONS                                                                                                                        I have reviewed the patient's current medications.  ASSESSMENT/PLAN:                                                                                                           Possible seizure, but no postictal confusion. Now back to baseline. Will check valproic acid level, EEG, and follow up closely.  Wyatt Portela, MD  [facility] [phone]  09/03/2012, 6:59 PM

## 2012-09-04 DIAGNOSIS — G40909 Epilepsy, unspecified, not intractable, without status epilepticus: Secondary | ICD-10-CM

## 2012-09-04 DIAGNOSIS — R569 Unspecified convulsions: Secondary | ICD-10-CM

## 2012-09-04 LAB — CBC
HCT: 36.8 % (ref 36.0–46.0)
MCH: 31 pg (ref 26.0–34.0)
MCV: 92 fL (ref 78.0–100.0)
Platelets: 224 10*3/uL (ref 150–400)
RDW: 13 % (ref 11.5–15.5)

## 2012-09-04 LAB — GLUCOSE, CAPILLARY
Glucose-Capillary: 93 mg/dL (ref 70–99)
Glucose-Capillary: 103 mg/dL — ABNORMAL HIGH (ref 70–99)
Glucose-Capillary: 121 mg/dL — ABNORMAL HIGH (ref 70–99)

## 2012-09-04 LAB — BASIC METABOLIC PANEL
BUN: 6 mg/dL (ref 6–23)
CO2: 22 mEq/L (ref 19–32)
Calcium: 8.8 mg/dL (ref 8.4–10.5)
Chloride: 104 mEq/L (ref 96–112)
Creatinine, Ser: 0.48 mg/dL — ABNORMAL LOW (ref 0.50–1.10)
Glucose, Bld: 93 mg/dL (ref 70–99)

## 2012-09-04 MED ORDER — LEVETIRACETAM 750 MG PO TABS
750.0000 mg | ORAL_TABLET | Freq: Two times a day (BID) | ORAL | Status: DC
  Administered 2012-09-04 – 2012-09-07 (×6): 750 mg via ORAL
  Filled 2012-09-04 (×7): qty 1

## 2012-09-04 MED ORDER — LORAZEPAM 2 MG/ML IJ SOLN
INTRAMUSCULAR | Status: AC
  Administered 2012-09-04: 18:00:00
  Filled 2012-09-04: qty 1

## 2012-09-04 MED ORDER — LORAZEPAM 2 MG/ML IJ SOLN: 2.0000 mg | INTRAMUSCULAR | Status: DC | PRN

## 2012-09-04 NOTE — Progress Notes (Signed)
NEURO HOSPITALIST PROGRESS NOTE   SUBJECTIVE:                                                                                                                        No further seizures or other neurological developments. She is alert and following commands properly. Valproic acid 56.9. Keppra 500 mg BID. EEG pending.  OBJECTIVE:                                                                                                                           Vital signs in last 24 hours: Temp:  [98.1 F (36.7 C)-98.9 F (37.2 C)] 98.9 F (37.2 C) (01/19 1110) Pulse Rate:  [85-104] 99  (01/19 0759) Resp:  [16-24] 21  (01/19 0759) BP: (118-142)/(71-87) 141/82 mmHg (01/19 0758) SpO2:  [97 %-100 %] 98 % (01/19 0759)  Intake/Output from previous day: 01/18 0701 - 01/19 0700 In: 1625 [P.O.:600; I.V.:825; IV Piggyback:200] Out: -  Intake/Output this shift: Total I/O In: 75 [I.V.:75] Out: 250 [Urine:250] Nutritional status: Cardiac  Past Medical History  Diagnosis Date  . HTN (hypertension)   . Hyperlipidemia   . Meningioma 1999  . Myocardial infarction 1985?  Marland Kitchen Headache     "not real frequent" (08/25/2012)  . Arthritis     "probably" (08/25/2012)  . Stroke 08/25/2012    "that's what they think I've had; sudden LUE weakness" (08/25/2012)  . Spastic paraparesis     S/P meningioma resection 1999/notes 08/25/2012  . Paresis of lower extremity 1999    BLE/notes 08/25/2012    Neurologic ROS negative with exception of above. Musculoskeletal ROS: unchanged.  Neurologic Exam:  She is alert, awake, oriented x4. Follows commands properly and has no language or speech impediment.  CN 2-12: unremarkable exam after regaining consciousness rather quickly.  Motor: left HP that is described as an old finding.  Sensory: localizes pain.  DTR's: 2+ all over.  Plantar responses:upgoing bilaterally.  Coordination and gait: no tested   Lab Results: Lab Results    Component Value Date/Time   CHOL 138 09/03/2012  6:20 AM   Lipid Panel  Basename 09/03/12 0620  CHOL 138  TRIG 116  HDL 65  CHOLHDL 2.1  VLDL 23  LDLCALC 50  Studies/Results: Ct Head Wo Contrast  09/02/2012  *RADIOLOGY REPORT*  Clinical Data: Right-sided facial droop and left-sided weakness. Right-sided gaze.  CT HEAD WITHOUT CONTRAST  Technique:  Contiguous axial images were obtained from the base of the skull through the vertex without contrast.  Comparison: Multiple prior head CTs, most recent 08/26/2012 and 08/25/2012  Findings: Prior vertex craniotomy with multiple cerclage wires and surgical clips again noted.  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  There is streak artifact from the surgical clips.  Ventricles are stable in size.  No evidence of acute hemorrhage, hydrocephalus, mass lesion, or acute cortically based infarction.  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  No acute bony abnormality.  Visualized paranasal sinuses and mastoid air cells are clear.  IMPRESSION: Stable postoperative changes and bihemisphere encephalomalacia. No acute intracranial abnormality identified.   Original Report Authenticated By: Britta Mccreedy, M.D.    Dg Chest Port 1 View  09/02/2012  *RADIOLOGY REPORT*  Clinical Data: Facial droop and headache  PORTABLE CHEST - 1 VIEW  Comparison: None  Findings: Low lung volumes.  Cardiac leads project over the chest. Heart, mediastinal, and hilar contours are within normal limits. The lungs are clear.  No visible pleural effusion.  Negative for pneumothorax.  Imaged bones are unremarkable.  IMPRESSION: Low lung volumes.  No acute findings.   Original Report Authenticated By: Britta Mccreedy, M.D.     MEDICATIONS                                                                                                                       I have reviewed the patient's current  medications.  ASSESSMENT/PLAN:                                                                                                           Doing better at this moment. Awaiting for EEG to be completed. Continue current treatment. Will follow up.  Wyatt Portela, MD [facility] [phone]  09/04/2012, 12:12 PM

## 2012-09-04 NOTE — Progress Notes (Signed)
Pt had witnessed seizure lasting 10 minutes. Received intravenous ativan. Oxygen started. Dr Michel Santee A. Made aware.

## 2012-09-04 NOTE — Progress Notes (Signed)
[facility]             Progress Note   Subjective: Left neglect, left arm paresis. Yesterday's events noted.  Objective: Vital signs in last 24 hours: Temp:  [98.1 F (36.7 C)-98.3 F (36.8 C)] 98.1 F (36.7 C) (01/19 0800) Pulse Rate:  [85-104] 99  (01/19 0759) Resp:  [16-24] 21  (01/19 0759) BP: (118-142)/(71-87) 141/82 mmHg (01/19 0758) SpO2:  [97 %-100 %] 98 % (01/19 0759) Weight change:     Intake/Output from previous day: 01/18 0701 - 01/19 0700 In: 1625 [P.O.:600; I.V.:825; IV Piggyback:200] Out: -  Total I/O In: 75 [I.V.:75] Out: 250 [Urine:250]   Physical Exam: General: Alert, awake, oriented x3. HEENT: No bruits, no goiter. Heart: Regular rate and rhythm, without murmurs, rubs, gallops. Lungs: Clear to auscultation bilaterally. Abdomen: Soft, nontender, nondistended, positive bowel sounds. Extremities: No clubbing cyanosis or edema with positive pedal pulses.    Lab Results: Basic Metabolic Panel:  Basename 09/04/12 0430 09/02/12 1730  NA 138 138  K 3.2* 3.4*  CL 104 101  CO2 22 23  GLUCOSE 93 114*  BUN 6 12  CREATININE 0.48* 0.51  CALCIUM 8.8 9.1  MG -- --  PHOS -- --   Liver Function Tests:  Basename 09/02/12 1730  AST 27  ALT 42*  ALKPHOS 56  BILITOT 0.4  PROT 6.7  ALBUMIN 3.4*   CBC:  Basename 09/04/12 0430 09/02/12 1730  WBC 7.3 11.0*  NEUTROABS -- 6.7  HGB 12.4 14.6  HCT 36.8 42.1  MCV 92.0 92.1  PLT 224 261   CBG:  Basename 09/04/12 0758 09/04/12 0301 09/03/12 2301 09/03/12 2012 09/03/12 1818 09/03/12 1623  GLUCAP 93 95 101* 107* 91 87   Fasting Lipid Panel:  Basename 09/03/12 0620  CHOL 138  HDL 65  LDLCALC 50  TRIG 116  CHOLHDL 2.1  LDLDIRECT --   Urine Drug Screen: Drugs of Abuse     Component Value Date/Time   LABOPIA POSITIVE* 08/25/2012 1909   COCAINSCRNUR NONE DETECTED 08/25/2012 1909   LABBENZ POSITIVE* 08/25/2012 1909   AMPHETMU NONE DETECTED 08/25/2012 1909   THCU NONE DETECTED 08/25/2012  1909   LABBARB NONE DETECTED 08/25/2012 1909    Urinalysis:  Basename 09/02/12 2212 09/02/12 1955  COLORURINE YELLOW YELLOW  LABSPEC 1.016 1.025  PHURINE 5.5 6.0  GLUCOSEU NEGATIVE NEGATIVE  HGBUR MODERATE* NEGATIVE  BILIRUBINUR NEGATIVE NEGATIVE  KETONESUR NEGATIVE NEGATIVE  PROTEINUR 100* NEGATIVE  UROBILINOGEN 0.2 0.2  NITRITE NEGATIVE NEGATIVE  LEUKOCYTESUR LARGE* MODERATE*    Recent Results (from the past 240 hour(s))  URINE CULTURE     Status: Normal   Collection Time   08/25/12  7:09 PM      Component Value Range Status Comment   Specimen Description URINE, CATHETERIZED   Final    Special Requests NONE   Final    Culture  Setup Time 08/25/2012 20:50   Final    Colony Count >=100,000 COLONIES/ML   Final    Culture ESCHERICHIA COLI   Final    Report Status 08/27/2012 FINAL   Final    Organism ID, Bacteria ESCHERICHIA COLI   Final   MRSA PCR SCREENING     Status: Normal   Collection Time   09/03/12  3:16 PM      Component Value Range Status Comment   MRSA by PCR NEGATIVE  NEGATIVE Final     Studies/Results: Ct Head Wo Contrast  09/02/2012  *RADIOLOGY REPORT*  Clinical Data: Right-sided  facial droop and left-sided weakness. Right-sided gaze.  CT HEAD WITHOUT CONTRAST  Technique:  Contiguous axial images were obtained from the base of the skull through the vertex without contrast.  Comparison: Multiple prior head CTs, most recent 08/26/2012 and 08/25/2012  Findings: Prior vertex craniotomy with multiple cerclage wires and surgical clips again noted.  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  There is streak artifact from the surgical clips.  Ventricles are stable in size.  No evidence of acute hemorrhage, hydrocephalus, mass lesion, or acute cortically based infarction.  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  No acute bony abnormality.  Visualized paranasal sinuses and mastoid air cells are clear.   IMPRESSION: Stable postoperative changes and bihemisphere encephalomalacia. No acute intracranial abnormality identified.   Original Report Authenticated By: Britta Mccreedy, M.D.    Dg Chest Port 1 View  09/02/2012  *RADIOLOGY REPORT*  Clinical Data: Facial droop and headache  PORTABLE CHEST - 1 VIEW  Comparison: None  Findings: Low lung volumes.  Cardiac leads project over the chest. Heart, mediastinal, and hilar contours are within normal limits. The lungs are clear.  No visible pleural effusion.  Negative for pneumothorax.  Imaged bones are unremarkable.  IMPRESSION: Low lung volumes.  No acute findings.   Original Report Authenticated By: Britta Mccreedy, M.D.     Medications: Scheduled Meds:    . aspirin EC  325 mg Oral Daily  . baclofen  20 mg Oral TID  . ciprofloxacin  400 mg Intravenous Q12H  . clopidogrel  75 mg Oral Q breakfast  . diazepam  5 mg Oral QHS  . docusate sodium  100 mg Oral BID  . levETIRAcetam  500 mg Oral BID  . potassium chloride  10 mEq Oral BID  . senna-docusate  1 tablet Oral q morning - 10a  . simvastatin  20 mg Oral QHS   Continuous Infusions:    . sodium chloride 75 mL/hr (09/04/12 0800)   PRN Meds:.acetaminophen, bisacodyl, HYDROcodone-acetaminophen, LORazepam, sodium phosphate  Assessment/Plan:  Active Problems:  HYPERTENSION  Paralysis of upper limb  UTI (lower urinary tract infection)  Facial droop  Seizure   Acute CVA -Unable to do MRI. -Repeat CT unchanged, but neurology believes most likely acute infarct. -Jonne Ply has been upgraded to plavix.  Seizure -Had a seizure last pm. -Aborted with ativan, depakote. -Appreciate neurology recommendations. -EEG pending.  UTI -Continue cipro pending cx data.  HTN -Well controlled.  Constipation -Colace/stool softener  Disposition -See no need for SDU. -Will transfer back to tele (main reason for admission is CVA). -Hopeful for DC back to SNF in 1-2 days.   Time spent coordinating care:  35 minutes.   LOS: 2 days   [facility] Pager: [phone] 09/04/2012, 10:00 AM

## 2012-09-04 NOTE — Progress Notes (Signed)
Report called to Montez Hageman, RN. No questions. Family aware of patient being transferred to [facility] room 7.

## 2012-09-05 ENCOUNTER — Inpatient Hospital Stay (HOSPITAL_COMMUNITY): Payer: Medicare Other

## 2012-09-05 DIAGNOSIS — I639 Cerebral infarction, unspecified: Secondary | ICD-10-CM

## 2012-09-05 LAB — URINE CULTURE
Colony Count: >=100000
Colony Count: 95000

## 2012-09-05 LAB — GLUCOSE, CAPILLARY
Glucose-Capillary: 89 mg/dL (ref 70–99)
Glucose-Capillary: 106 mg/dL — ABNORMAL HIGH (ref 70–99)

## 2012-09-05 NOTE — Clinical Social Work Psychosocial (Signed)
     Clinical Social Work Department BRIEF PSYCHOSOCIAL ASSESSMENT 09/05/2012  Patient:  Stacy Diaz, Stacy Diaz     Account Number:  [id number]     Admit date:  09/02/2012  Clinical Social Worker:  Peggyann Shoals  Date/Time:  09/05/2012 05:11 PM  Referred by:  Physician  Date Referred:  09/05/2012 Referred for  Other - See comment  SNF Placement   Other Referral:   Pt was admitted from a facility   Interview type:  Family Other interview type:    PSYCHOSOCIAL DATA Living Status:  FACILITY Admitted from facility:  [facility] Level of care:  Skilled Nursing Facility Primary support name:  Stacy Diaz Primary support relationship to patient:  SPOUSE Degree of support available:   very supportive.  cell#  [phone]    CURRENT CONCERNS Current Concerns  Post-Acute Placement   Other Concerns:    SOCIAL WORK ASSESSMENT / PLAN CSW met with pt and husband to address consult, as pt was admitted from a SNF. CSW introduced herself and explained role of social work. CSW also explained process of discharging to SNF.    Pt was admitted from [facility]. Pt's husband is interested in her going to another SNF, preferrable [name]. Pt's husband shared that he has been her primary caregiver for the past 4 years.    CSW will initiate SNF referral and follow up with bed offers. CSW will continue to follow to facilitate discharge.   Assessment/plan status:  Psychosocial Support/Ongoing Assessment of Needs Other assessment/ plan:   Information/referral to community resources:   SNF list    PATIENTS/FAMILYS RESPONSE TO PLAN OF CARE: Pt and husband were very pleasant and agreeable to discharge plan.

## 2012-09-05 NOTE — Progress Notes (Signed)
EEG completed by D. Scott.

## 2012-09-05 NOTE — Procedures (Signed)
EEG report.  Brief clinical history: 62 years old female with long standing history of surgical removal large meningioma and seizures. She is on keppra. Had had increasing seizures during this hospitalization. Technique: this is a 17 channel routine scalp EEG performed at the bedside with bipolar and monopolar montages arranged in accordance to the international 10/20 system of electrode placement. One channel was dedicated to EKG recording.  The study was performed during  wakefulness and drowsiness.   Hyperventilation and Intermittent photic stimulation were utilized as activating procedures.  Description:In the wakeful state, the best background was sen over the posterior head regions, and consisted of a well sustained, medium amplitude, symmetric and reactive 10 Hz rhythm. Drowsiness demonstrated attenuation of the alpha rhythm. Hyperventilation induced mild, diffuse, physiologic slowing. Infrequent, intermittent right temporal-parietal theta slowing noted.   No focal or generalized epileptiform discharges noted.  Patient did not have her habitual seizures during this recording. Importantly, the study did not reveal evidence of electrographic seizures. EKG showed sinus rhythm.  Impression: this is a mildly abnormal EEG with findings suggestive of mild focal neuronal dysfunction involving the right temporo-parietal region. Please, be aware that the absence of epileptiform discharges does not exclude the possibility of epilepsy.  Clinical correlation is advised.  Wyatt Portela, MD

## 2012-09-05 NOTE — Progress Notes (Signed)
Occupational Therapy Treatment Patient Details Name: Stacy Diaz MRN: [id number] DOB: 1951/06/20 Today's Date: 09/05/2012 Time:  -     OT Assessment / Plan / Recommendation Comments on Treatment Session Pt with significant Lt. neglect.  Requires total A for self feeding.  Unable to initiate scooping food, or bringing utensil to mouth.  Pt. turns head to midline and can identify general shapes and collar at midline.  Midway through session, pt. with spontaneous head turn to Lt. and able to locate therapist on Lt. and susequently picked up spoon and placed it in bowl, and picked up napkin on Lt to wipe mouth.  Pt with report of increased anxiety after this, and unable to continue    Follow Up Recommendations  SNF    Barriers to Discharge       Equipment Recommendations  None recommended by OT    Recommendations for Other Services    Frequency Min 3X/week   Plan Discharge plan needs to be updated    Precautions / Restrictions     Pertinent Vitals/Pain     ADL  Eating/Feeding: Performed;+1 Total assistance Where Assessed - Eating/Feeding: Bed level ADL Comments: Pt supine with HOB elevated and son feeding her.  Planned to work on self feeding with pt.  With max cues, pt able to turn head and eyes to midline, but unable to locate items on food tray even with max cues.  Placed spoon in Rt. hand, and placed bowl in various locations at midline and to pt's Rt. but pt unable to locate.  Hand over hand guidance provided to locate bowl and scoop food item, but pt unable to bring spoon to mouth despite max verbal cues.  Pt. eventually states "I can't", and "yes" when asked if she got stuck.  Pt. endorses fatigue.  Pt. then with spontaneous head turn to Lt. with eyes to Lt. and fixated on ceiling. Pt seemed to "zone out", but pt. able to describe what she sees on ceiling, and was able to lower gaze on command.  Pt. noted with dysconjugate gaze and endorses shadowing/diplopia.   Pt continued to  converse with therapist with head turned to Lt and focusing with therapist on Lt.  Pt. then spontaneously placed spoon in bowl at her midline (on bedside table), and picked up napkin located on her left, and wiped her mouth.   Pt gaze returned Rt. with full Rt. head turn.   Spoke with family re: neglect, and pt asked that we stop and states she is "very anxious".  Husband reports that she has been having "anxiety attacks" with therapy since previous admissions.  Relaxation techniques utilized.    OT Diagnosis:    OT Problem List:   OT Treatment Interventions:     OT Goals Miscellaneous OT Goals OT Goal: Miscellaneous Goal #2 - Progress: Not progressing  Visit Information  Last OT Received On: 09/05/12 Assistance Needed: +2    Subjective Data      Prior Functioning       Cognition  Overall Cognitive Status: Impaired Area of Impairment: Attention;Following commands;Awareness of errors;Awareness of deficits Arousal/Alertness: Awake/alert Orientation Level: Oriented X4 / Intact Behavior During Session: Flat affect Current Attention Level: Focused Following Commands: Follows one step commands inconsistently Awareness of Errors: Assistance required to identify errors made;Assistance required to correct errors made Cognition - Other Comments: Pt with significant Lt. neglect    Mobility  Shoulder Instructions         Exercises  Balance     End of Session OT - End of Session Activity Tolerance: Patient limited by fatigue;Other (comment) (anxiety) Patient left: in bed;with call bell/phone within reach;with family/visitor present  GO     Irelynn Schermerhorn, Ursula Alert M 09/05/2012, 4:47 PM

## 2012-09-05 NOTE — Progress Notes (Signed)
[facility]             Progress Note   Subjective: Left neglect, left arm paresis. Had another seizure event yesterday afternoon.  Objective: Vital signs in last 24 hours: Temp:  [97.2 F (36.2 C)-97.8 F (36.6 C)] 97.3 F (36.3 C) (01/20 1035) Pulse Rate:  [74-100] 94  (01/20 1035) Resp:  [14-20] 20  (01/20 1035) BP: (117-150)/(56-84) 150/84 mmHg (01/20 1035) SpO2:  [97 %-100 %] 100 % (01/20 1035) Weight:  [74.1 kg (163 lb 5.8 oz)] 74.1 kg (163 lb 5.8 oz) (01/19 2000) Weight change:  Last BM Date: 09/04/12  Intake/Output from previous day: 01/19 0701 - 01/20 0700 In: 675 [I.V.:675] Out: 250 [Urine:250] Total I/O In: 975 [I.V.:375; IV Piggyback:600] Out: -    Physical Exam: General: Alert, awake, oriented x3. HEENT: No bruits, no goiter. Heart: Regular rate and rhythm, without murmurs, rubs, gallops. Lungs: Clear to auscultation bilaterally. Abdomen: Soft, nontender, nondistended, positive bowel sounds. Extremities: No clubbing cyanosis or edema with positive pedal pulses.    Lab Results: Basic Metabolic Panel:  Basename 09/04/12 0430 09/02/12 1730  NA 138 138  K 3.2* 3.4*  CL 104 101  CO2 22 23  GLUCOSE 93 114*  BUN 6 12  CREATININE 0.48* 0.51  CALCIUM 8.8 9.1  MG -- --  PHOS -- --   Liver Function Tests:  Basename 09/02/12 1730  AST 27  ALT 42*  ALKPHOS 56  BILITOT 0.4  PROT 6.7  ALBUMIN 3.4*   CBC:  Basename 09/04/12 0430 09/02/12 1730  WBC 7.3 11.0*  NEUTROABS -- 6.7  HGB 12.4 14.6  HCT 36.8 42.1  MCV 92.0 92.1  PLT 224 261   CBG:  Basename 09/05/12 1204 09/05/12 0652 09/04/12 2308 09/04/12 1631 09/04/12 1145 09/04/12 0758  GLUCAP 134* 89 121* 98 103* 93   Fasting Lipid Panel:  Basename 09/03/12 0620  CHOL 138  HDL 65  LDLCALC 50  TRIG 116  CHOLHDL 2.1  LDLDIRECT --   Urine Drug Screen: Drugs of Abuse     Component Value Date/Time   LABOPIA POSITIVE* 08/25/2012 1909   COCAINSCRNUR NONE DETECTED 08/25/2012  1909   LABBENZ POSITIVE* 08/25/2012 1909   AMPHETMU NONE DETECTED 08/25/2012 1909   THCU NONE DETECTED 08/25/2012 1909   LABBARB NONE DETECTED 08/25/2012 1909    Urinalysis:  Basename 09/02/12 2212 09/02/12 1955  COLORURINE YELLOW YELLOW  LABSPEC 1.016 1.025  PHURINE 5.5 6.0  GLUCOSEU NEGATIVE NEGATIVE  HGBUR MODERATE* NEGATIVE  BILIRUBINUR NEGATIVE NEGATIVE  KETONESUR NEGATIVE NEGATIVE  PROTEINUR 100* NEGATIVE  UROBILINOGEN 0.2 0.2  NITRITE NEGATIVE NEGATIVE  LEUKOCYTESUR LARGE* MODERATE*    Recent Results (from the past 240 hour(s))  URINE CULTURE     Status: Normal   Collection Time   09/02/12  7:55 PM      Component Value Range Status Comment   Specimen Description URINE, CLEAN CATCH   Final    Special Requests NONE   Final    Culture  Setup Time 09/02/2012 21:14   Final    Colony Count >=100,000 COLONIES/ML   Final    Culture ENTEROCOCCUS SPECIES   Final    Report Status 09/05/2012 FINAL   Final    Organism ID, Bacteria ENTEROCOCCUS SPECIES   Final   URINE CULTURE     Status: Normal   Collection Time   09/02/12 10:12 PM      Component Value Range Status Comment   Specimen Description URINE, CATHETERIZED  Final    Special Requests NONE   Final    Culture  Setup Time 09/02/2012 22:46   Final    Colony Count 95,000 COLONIES/ML   Final    Culture ENTEROCOCCUS SPECIES   Final    Report Status 09/05/2012 FINAL   Final    Organism ID, Bacteria ENTEROCOCCUS SPECIES   Final   MRSA PCR SCREENING     Status: Normal   Collection Time   09/03/12  3:16 PM      Component Value Range Status Comment   MRSA by PCR NEGATIVE  NEGATIVE Final     Studies/Results: No results found.  Medications: Scheduled Meds:    . baclofen  20 mg Oral TID  . ciprofloxacin  400 mg Intravenous Q12H  . clopidogrel  75 mg Oral Q breakfast  . diazepam  5 mg Oral QHS  . docusate sodium  100 mg Oral BID  . levETIRAcetam  750 mg Oral BID  . potassium chloride  10 mEq Oral BID  . senna-docusate  1  tablet Oral q morning - 10a  . simvastatin  20 mg Oral QHS   Continuous Infusions:    . sodium chloride 75 mL/hr at 09/05/12 1200   PRN Meds:.acetaminophen, bisacodyl, HYDROcodone-acetaminophen, LORazepam, LORazepam, sodium phosphate  Assessment/Plan:  Active Problems:  HYPERTENSION  Paralysis of upper limb  UTI (lower urinary tract infection)  Facial droop  Seizure   Acute CVA -Unable to do MRI. -Repeat CT unchanged, but neurology believes most likely acute infarct. -Jonne Ply has been upgraded to plavix.  Seizure -Has had 2 seizures while in the hospital. -Her keppra was increased to 750 mg BID yesterday after discussion with neurology.  UTI -Pansensitive enterococcus. -Continue cipro for a total of 7 days.  HTN -Well controlled.  Constipation -Colace/stool softener  Disposition -Will plan for DC back to SNF in am.  Time spent coordinating care: 35 minutes.   LOS: 3 days   HERNANDEZ ACOSTA,ESTELA [facility] Pager: [phone] 09/05/2012, 1:17 PM

## 2012-09-05 NOTE — Progress Notes (Signed)
NEURO HOSPITALIST PROGRESS NOTE   SUBJECTIVE:                                                                                                                        Patient continues to state she has left sided weakness. No further episodes of seizure over night.  OBJECTIVE:                                                                                                                           Vital signs in last 24 hours: Temp:  [97.2 F (36.2 C)-98.9 F (37.2 C)] 97.2 F (36.2 C) (01/20 0602) Pulse Rate:  [74-108] 74  (01/20 0602) Resp:  [12-18] 18  (01/20 0602) BP: (117-147)/(56-75) 117/56 mmHg (01/20 0602) SpO2:  [97 %-100 %] 100 % (01/20 0602) Weight:  [74.1 kg (163 lb 5.8 oz)] 74.1 kg (163 lb 5.8 oz) (01/19 2000)  Intake/Output from previous day: 01/19 0701 - 01/20 0700 In: 675 [I.V.:675] Out: 250 [Urine:250] Intake/Output this shift:   Nutritional status: Cardiac  Past Medical History  Diagnosis Date  . HTN (hypertension)   . Hyperlipidemia   . Meningioma 1999  . Myocardial infarction 1985?  Marland Kitchen Headache     "not real frequent" (08/25/2012)  . Arthritis     "probably" (08/25/2012)  . Stroke 08/25/2012    "that's what they think I've had; sudden LUE weakness" (08/25/2012)  . Spastic paraparesis     S/P meningioma resection 1999/notes 08/25/2012  . Paresis of lower extremity 1999    BLE/notes 08/25/2012    Neurologic ROS negative with exception of above. Musculoskeletal ROS weakness  Neurologic Exam:  Mental Status:  Alert, oriented, thought content appropriate. Speech fluent without evidence of aphasia. Able to follow 3 step commands without difficulty. Left neglect  Cranial Nerves:  II: Left Homonomous Hemianopsia, pupils equal, round, reactive to light and accommodation  III,IV, VI: ptosis not present, extra-ocular motions intact to dolls maneuver bilaterally  but when asked to look to the left does not go beyond midline.  V,VII:  left facial droop, facial light touch sensation normal bilaterally  VIII: hearing normal bilaterally  IX,X: gag reflex present  XI: bilateral shoulder shrug  XII: midline tongue extension  Motor:  Right : Upper extremity 5/5       Left: Upper extremity 0/5 (flaccid)  Lower extremity 2/5       Lower extremity 2/5  --slight hip flexion, able to adduct and abduct hips.  Increased tone in the lower extremities  Sensory: Pinprick and light touch intact throughout, bilaterally  Deep Tendon Reflexes: 2+ in the upper extremities (right > left) and 3+ in the lower extremities.  Plantars:  Up going bilaterally  Cerebellar:  Not performed secondary to hemiplegia and hemianopsia     Lab Results: Lab Results  Component Value Date/Time   CHOL 138 09/03/2012  6:20 AM   Lipid Panel  Basename 09/03/12 0620  CHOL 138  TRIG 116  HDL 65  CHOLHDL 2.1  VLDL 23  LDLCALC 50    Studies/Results: No results found.  MEDICATIONS                                                                                                                        Scheduled:    . baclofen  20 mg Oral TID  . ciprofloxacin  400 mg Intravenous Q12H  . clopidogrel  75 mg Oral Q breakfast  . diazepam  5 mg Oral QHS  . docusate sodium  100 mg Oral BID  . levETIRAcetam  750 mg Oral BID  . potassium chloride  10 mEq Oral BID  . senna-docusate  1 tablet Oral q morning - 10a  . simvastatin  20 mg Oral QHS    ASSESSMENT/PLAN:                                                                                                               Patient Active Hospital Problem List:  Paralysis of upper limb (08/25/2012)   Assessment: No changes since previous progress note.    Plan: Continue Plavix daily  Seizure (09/04/2012)   Assessment: No further events.  Remains on 750 mg Keppra BID.    Plan: EEG Pending   Felicie Morn PA-C [facility] [phone]  09/05/2012, 8:57 AM

## 2012-09-05 NOTE — Progress Notes (Signed)
SLP Cancellation Note  Patient Details Name: MYANA SCHLUP MRN: [id number] DOB: 08-22-1950   Cancelled treatment:       Reason Eval/Treat Not Completed: Fatigue/lethargy limiting ability to participate. Patient politely declined. Patient and husband reported only mild oral difficulties. Will f/u in am 1/21.   Ferdinand Lango MA, CCC-SLP [phone]   Ferdinand Lango Meryl 09/05/2012, 4:27 PM

## 2012-09-05 NOTE — Clinical Social Work Placement (Addendum)
    Clinical Social Work Department CLINICAL SOCIAL WORK PLACEMENT NOTE 09/05/2012  Patient:  SIREN, PORRATA  Account Number:  [id number] Admit date:  09/02/2012  Clinical Social Worker:  Peggyann Shoals  Date/time:  09/05/2012 05:16 PM  Clinical Social Work is seeking post-discharge placement for this patient at the following level of care:   SKILLED NURSING   (*CSW will update this form in Epic as items are completed)   09/05/2012  Patient/family provided with [facility] Department of Clinical Social Work's list of facilities offering this level of care within the geographic area requested by the patient (or if unable, by the patient's family).  09/05/2012  Patient/family informed of their freedom to choose among providers that offer the needed level of care, that participate in Medicare, Medicaid or managed care program needed by the patient, have an available bed and are willing to accept the patient.  09/05/2012  Patient/family informed of [facility]' ownership interest in [facility], as well as of the fact that they are under no obligation to receive care at this facility.  PASARR submitted to EDS on existing PASARR PASARR number received from EDS on   FL2 transmitted to all facilities in geographic area requested by pt/family on  09/07/2011 FL2 transmitted to all facilities within larger geographic area on   Patient informed that his/her managed care company has contracts with or will negotiate with  certain facilities, including the following:     Patient/family informed of bed offers received:  09/06/2012 Patient chooses bed at [facility] Physician recommends and patient chooses bed at  [facility]  Patient to be transferred to [facility] on 09/07/2012 Patient to be transferred to facility by [facility]  The following physician request were entered in Epic:   Additional Comments:

## 2012-09-05 NOTE — Progress Notes (Signed)
Pt IV was burning. D/C'd original IV. Paged IV team. Will continue to monitor.

## 2012-09-06 DIAGNOSIS — I6350 Cerebral infarction due to unspecified occlusion or stenosis of unspecified cerebral artery: Secondary | ICD-10-CM

## 2012-09-06 LAB — GLUCOSE, CAPILLARY
Glucose-Capillary: 92 mg/dL (ref 70–99)
Glucose-Capillary: 88 mg/dL (ref 70–99)
Glucose-Capillary: 88 mg/dL (ref 70–99)

## 2012-09-06 MED ORDER — DIAZEPAM 5 MG PO TABS: 5.0000 mg | ORAL_TABLET | Freq: Every day | ORAL | Status: DC

## 2012-09-06 MED ORDER — DSS 100 MG PO CAPS: 100.0000 mg | ORAL_CAPSULE | Freq: Two times a day (BID) | ORAL | Status: DC

## 2012-09-06 MED ORDER — LORAZEPAM 0.5 MG PO TABS: 0.5000 mg | ORAL_TABLET | Freq: Four times a day (QID) | ORAL | Status: DC | PRN

## 2012-09-06 MED ORDER — CIPROFLOXACIN HCL 250 MG PO TABS: 250.0000 mg | ORAL_TABLET | Freq: Two times a day (BID) | ORAL | Status: AC

## 2012-09-06 MED ORDER — HYDROCODONE-ACETAMINOPHEN 7.5-325 MG PO TABS: 1.0000 | ORAL_TABLET | ORAL | Status: DC | PRN

## 2012-09-06 MED ORDER — CLOPIDOGREL BISULFATE 75 MG PO TABS: 75.0000 mg | ORAL_TABLET | Freq: Every day | ORAL | Status: DC

## 2012-09-06 MED ORDER — LEVETIRACETAM 750 MG PO TABS: 750.0000 mg | ORAL_TABLET | Freq: Two times a day (BID) | ORAL | Status: DC

## 2012-09-06 NOTE — Discharge Summary (Signed)
Physician Discharge Summary  Patient ID: Stacy Diaz MRN: [id number] DOB/AGE: 04-29-1951 62 y.o.  Admit date: 09/02/2012 Discharge date: 09/06/2012  Primary Care Physician:  Evette Georges, MD   Discharge Diagnoses:    Principal Problem:  *CVA (cerebral infarction) Active Problems:  Seizure  HYPERTENSION  Paralysis of upper limb  UTI (lower urinary tract infection)  Facial droop      Medication List     As of 09/06/2012  8:38 AM    STOP taking these medications         AMBULATORY NON FORMULARY MEDICATION      aspirin 325 MG EC tablet      TAKE these medications         atenolol-chlorthalidone 50-25 MG per tablet   Commonly known as: TENORETIC   Take 1 tablet by mouth daily.      baclofen 20 MG tablet   Commonly known as: LIORESAL   Take 20 mg by mouth 4 (four) times daily.      ciprofloxacin 250 MG tablet   Commonly known as: CIPRO   Take 1 tablet (250 mg total) by mouth 2 (two) times daily.      clopidogrel 75 MG tablet   Commonly known as: PLAVIX   Take 1 tablet (75 mg total) by mouth daily with breakfast.      diazepam 5 MG tablet   Commonly known as: VALIUM   Take 1 tablet (5 mg total) by mouth at bedtime.      DSS 100 MG Caps   Take 100 mg by mouth 2 (two) times daily.      HYDROcodone-acetaminophen 7.5-325 MG per tablet   Commonly known as: NORCO   Take 1 tablet by mouth every 4 (four) hours as needed. For pain      ibuprofen 200 MG tablet   Commonly known as: ADVIL,MOTRIN   Take 800 mg by mouth every 8 (eight) hours as needed. For pain      levETIRAcetam 750 MG tablet   Commonly known as: KEPPRA   Take 1 tablet (750 mg total) by mouth 2 (two) times daily.      lidocaine 5 %   Commonly known as: LIDODERM   Place 1 patch onto the skin daily. Remove & Discard patch within 12 hours or as directed by MD      LORazepam 0.5 MG tablet   Commonly known as: ATIVAN   Take 1 tablet (0.5 mg total) by mouth every 6 (six) hours as needed for  anxiety.      potassium chloride 10 MEQ tablet   Commonly known as: K-DUR,KLOR-CON   Take 1 tablet (10 mEq total) by mouth 2 (two) times daily.      senna-docusate 8.6-50 MG per tablet   Commonly known as: Senokot-S   Take 1 tablet by mouth every morning.      simvastatin 20 MG tablet   Commonly known as: ZOCOR   Take 1 tablet (20 mg total) by mouth at bedtime.      sodium phosphate 7-19 GM/118ML Enem   Place 1 enema rectally daily as needed. For constipation          Disposition and Follow-up:  Will be discharged to SNF today in stable and improved condition.  Consults:  Neurology, Dr. Leroy Kennedy   Significant Diagnostic Studies:  No results found.  Brief H and P: For complete details please refer to admission H and P, but in brief patient is a 62 y/o woman who  presented with a 2 day hx of Left facial droop and numbness as well as worsening confusion. Husband also noted that her head is drifting to the right. She was just admitted to [facility] with possible CVA like symptoms resulting in Left arm paralysis last week and was discharged on 1/15 to [facility]. MRI was an option since she have had hx of operation due to Meningioma. Since her surgery she have had trouble with weakness from waist down. At her baseline she have had trouble ambulating for the past 4 years she can bear weight but needs help with transfer. Lately her legs have been non-weight bearing at all.  CT scan done today did not show any new findings. We were asked to admit her for further evaluation and management.     Hospital Course:  Principal Problem:  *CVA (cerebral infarction) Active Problems:  Seizure  HYPERTENSION  Paralysis of upper limb  UTI (lower urinary tract infection)  Facial droop   Right Brain Acute CVA  -Unable to do MRI.  -Repeat CT unchanged, but neurology believes most likely acute infarct.  -Jonne Ply has been upgraded to plavix.   Seizure  -Has had 2 seizures while in the hospital.  -Her  keppra was increased to 750 mg BID  after discussion with neurology.   UTI  -Pansensitive enterococcus.  -Continue cipro for a total of 7 days. 5 more days at time of DC.  HTN  -Well controlled.   Constipation  -Colace/stool softener    Time spent on Discharge: Greater than 30 minutes.  SignedChaya Jan [facility] Pager: [phone] 09/06/2012, 8:38 AM

## 2012-09-06 NOTE — Evaluation (Signed)
SLP reviewed and agree with student findings.   Lashara Urey MA, CCC-SLP [phone]    

## 2012-09-06 NOTE — Clinical Social Work Note (Signed)
Clinical Social Work   CSW met with pt and husband to address discharge plan. CSW informed pt that [facility] did not have bed availbility and second choice will need to be made. When bed offers are obtained, CSW will provide them to pt's husband. CSW will continue to follow to facilitate discharge to SNF.   Dede Query, MSW, Theresia Majors [phone]

## 2012-09-06 NOTE — Evaluation (Signed)
Clinical/Bedside Swallow Evaluation Patient Details  Name: Stacy Diaz MRN: [id number] Date of Birth: Dec 05, 1950  Today's Date: 09/06/2012 Time: 0905-0940 SLP Time Calculation (min): 35 min  Past Medical History:  Past Medical History  Diagnosis Date  . HTN (hypertension)   . Hyperlipidemia   . Meningioma 1999  . Myocardial infarction 1985?  Marland Kitchen Headache     "not real frequent" (08/25/2012)  . Arthritis     "probably" (08/25/2012)  . Stroke 08/25/2012    "that's what they think I've had; sudden LUE weakness" (08/25/2012)  . Spastic paraparesis     S/P meningioma resection 1999/notes 08/25/2012  . Paresis of lower extremity 1999    BLE/notes 08/25/2012   Past Surgical History:  Past Surgical History  Procedure Date  . Coronary angioplasty 1970's  . Cardiac catheterization 1970's  . Brain meningioma excision 1999   HPI:      Assessment / Plan / Recommendation Clinical Impression  Pt presented with minimal s/s of aspiration characterized by a strong spontaneous cough after taking a very large sip of thin liquid via straw most likely due to left sided oral weakness resulting in reduced ability to control large bolus and suspected delay in swallow initiation. Due to Pt's severe left side neglect, therapeutic intervention was faciltitated with moderate verbal and tactile cues for Pt to use techniques to aid in clearing pocketed food from the oral cavity and consume small controlled sip sizes which eliminated overt s/s of aspiration. Pt stated that her son had been assisting her in clearing pocketed food with his finger prior to today's visit.   At time, aspiration risk moderate.  Pt and daughter were educated on reducing risks of aspiration by taking small bites and sips via straw (patient prefers due to anterior spillage with cupu sip), and in clearing solids from oral cavity by attempting to use her tongue (assist in building tongue strength) and use of finger to clear pocketed food after  consuming solids.    Aspiration Risk  Moderate    Diet Recommendation Regular;Thin liquid   Liquid Administration via: Straw;Cup Medication Administration: Whole meds with liquid (one at a time) Supervision: Patient able to self feed;Full supervision/cueing for compensatory strategies Compensations: Small sips/bites;Check for pocketing;Slow rate Postural Changes and/or Swallow Maneuvers: Seated upright 90 degrees    Other  Recommendations Oral Care Recommendations: Oral care BID   Follow Up Recommendations  Inpatient Rehab    Frequency and Duration min 2x/week  2 weeks   Pertinent Vitals/Pain n/a    SLP Swallow Goals Patient will utilize recommended strategies during swallow to increase swallowing safety with: Minimal assistance Swallow Study Goal #2 - Progress: Not met Goal #3: Pt will use recommended strategies as instructed by therapsit to clear pocketed food with finger and/or tongue (as able) after consuming solids after all meals with min cues.  Swallow Study Goal #3 - Progress: Not met   Swallow Study Prior Functional Status       General Date of Onset: 09/02/12 Type of Study: Bedside swallow evaluation Previous Swallow Assessment: none Diet Prior to this Study: Regular Temperature Spikes Noted: No Respiratory Status: Supplemental O2 delivered via (comment) (nasal cannula 1.5 L) Trach Size and Type:  (none) History of Recent Intubation: No Behavior/Cognition: Cooperative;Pleasant mood;Lethargic;Requires cueing Oral Cavity - Dentition: Adequate natural dentition Self-Feeding Abilities: Needs assist;Other (Comment) (left side neglect, verbal and tactile cues to cross midline) Patient Positioning: Upright in bed Baseline Vocal Quality: Clear;Low vocal intensity Volitional Cough: Strong Volitional Swallow: Able to  elicit (slow but able to elicit)    Oral/Motor/Sensory Function Overall Oral Motor/Sensory Function: Impaired (left side neglect) Labial ROM: Reduced  left Labial Symmetry: Abnormal symmetry left Labial Strength: Reduced Labial Sensation: Reduced Lingual ROM: Reduced left Lingual Symmetry: Abnormal symmetry left Lingual Strength: Reduced (reduced left) Lingual Sensation: Reduced Facial ROM: Reduced left Facial Symmetry: Left droop (mouth) Facial Strength: Reduced Facial Sensation: Reduced Velum: Within Functional Limits Mandible: Within Functional Limits   Ice Chips Ice chips: Not tested   Thin Liquid Thin Liquid: Impaired Presentation: Straw (better labial control via straw) Oral Phase Impairments: Reduced labial seal Pharyngeal  Phase Impairments: Cough - Immediate;Suspected delayed Swallow Other Comments: Pt produced strong spontaneous cough on first po trial after very big straw sip    Nectar Thick Nectar Thick Liquid: Not tested   Honey Thick Honey Thick Liquid: Not tested   Puree Puree: Within functional limits Presentation: Self Fed;Spoon (required verbal and tactile cues- po placed right of midline)   Solid   GO   [name] SLP student  Solid: Impaired Presentation: Self Fed Oral Phase Impairments: Reduced lingual movement/coordination;Impaired anterior to posterior transit Oral Phase Functional Implications: Oral residue (left sided buccal pocketing) Other Comments: required moderate verbal and tactile cues for Pt to clear pocketed food with finger and tongue       [name] 09/06/2012,11:06 AM

## 2012-09-06 NOTE — Progress Notes (Signed)
At 1500 patient complained of having new onset of numb/ tingling to her lft side , it lasted for about 10 mins, MD notified. No new orders at this time.

## 2012-09-07 MED ORDER — HYDROCODONE-ACETAMINOPHEN 7.5-325 MG PO TABS: 1.0000 | ORAL_TABLET | ORAL | Status: DC | PRN

## 2012-09-07 MED ORDER — CIPROFLOXACIN HCL 250 MG PO TABS
250.0000 mg | ORAL_TABLET | Freq: Two times a day (BID) | ORAL | Status: DC
  Filled 2012-09-07 (×2): qty 1

## 2012-09-07 NOTE — Progress Notes (Signed)
Education completed. IV d/c'd. Pt to be discharged to [facility] place via [place].

## 2012-09-07 NOTE — Progress Notes (Signed)
PT Cancellation Note  Patient Details Name: CALAH GERSHMAN MRN: [id number] DOB: 1951-01-15   Cancelled Treatment:     Pt d/cing to [facility]    Verdell Face, PTA [phone] 09/07/2012

## 2012-09-07 NOTE — Clinical Social Work Note (Signed)
Clinical Social Work   Pt is ready for discharge to [facility]. Facility has received discharge summary and is ready to admit. Pt and family are agreeable to discharge plan. [facility] will provide transportation. CSW is signing off as no further needs identified.   Dede Query, MSW, Theresia Majors [phone]

## 2012-09-07 NOTE — Progress Notes (Signed)
**  Late Entry for 09/06/12**   PT Progress Note:      09/06/12 1500  PT Visit Information  Last PT Received On 09/06/12  Assistance Needed +2  PT Time Calculation  PT Start Time 1146  PT Stop Time 1213  PT Time Calculation (min) 27 min  Precautions  Precautions Fall  Restrictions  Weight Bearing Restrictions No  Cognition  Overall Cognitive Status Impaired  Area of Impairment Awareness of deficits  Arousal/Alertness Awake/alert  Orientation Level Oriented X4 / Intact  Behavior During Session Flat affect  Awareness of Errors Assistance required to identify errors made;Assistance required to correct errors made  Awareness of Deficits Pt with Lt neglect  Cognition - Other Comments Pt with significant Lt. neglect  Bed Mobility  Bed Mobility Rolling Right;Rolling Left  Rolling Right 1: +1 Total assist  Rolling Left 2: Max assist;With rail  Details for Bed Mobility Assistance Cues for sequencing & technique.  Pt ablle to assist with reaching across body with RUE for rail on Lt but required total (A) to execute into complete sidelying position.    Transfers  Transfers Not assessed  Ambulation/Gait  Ambulation/Gait Assistance Not tested (comment)  PT - End of Session  Activity Tolerance Patient tolerated treatment well  Patient left in bed;with call bell/phone within reach;with family/visitor present  PT - Assessment/Plan  Comments on Treatment Session Pt progressing very slowly.   Will trial supine>sit next PT session.    PT Plan Discharge plan remains appropriate;Frequency remains appropriate  PT Frequency Min 3X/week  Follow Up Recommendations SNF  PT equipment None recommended by PT  Acute Rehab PT Goals  Time For Goal Achievement 09/17/12  Potential to Achieve Goals Fair  Pt will Roll Supine to Right Side with mod assist  PT Goal: Rolling Supine to Right Side - Progress Not met  Pt will Roll Supine to Left Side with mod assist  PT Goal: Rolling Supine to Left Side -  Progress Not met  Pt will go Supine/Side to Sit with mod assist  Pt will Sit at [facility] of Bed with mod assist;3-5 min;with unilateral upper extremity support  Pt will Transfer Bed to Chair/Chair to Bed with max assist  PT General Charges  $$ ACUTE PT VISIT 1 Procedure  PT Treatments  $Therapeutic Activity 23-37 mins     Verdell Face, [place] [phone] 09/07/2012

## 2012-09-07 NOTE — Progress Notes (Signed)
[facility] PROGRESS NOTE  Stacy Diaz:[id number] DOB: 1951/08/10 DOA: 09/02/2012 PCP: Evette Georges, MD  Assessment/Plan: Right Brain Acute CVA  Unable to do MRI. Repeat CT unchanged, but neurology believes most likely acute infarct.  ASA has been upgraded to plavix. Neurology evaluated the patient during the hospital stay.  Seizure  Has had 2 seizures while in the hospital. Her keppra was increased to 750 mg BID after discussion with neurology. Had had EEG during the hospital stay which was negative for any acute seizures.  UTI  Pansensitive enterococcus. Continue cipro for a total of 7 days. 4 more days from 09/07/2012.  HTN  Stable.   Constipation  Colace/stool softener  Code Status: DNR Family Communication: Family updated at bedside. Disposition Plan: DC to SNF today.   Consultants:  Neurology.  Procedures:  As above.  Antibiotics:  Ciprofloxacin.   HPI/Subjective: No specific concerns. Denies any pain.  Objective: Filed Vitals:   09/06/12 2127 09/07/12 0305 09/07/12 0704 09/07/12 1000  BP: 129/82 151/81 144/82 138/66  Pulse: 80 78 84 96  Temp: 97.7 F (36.5 C) 97.3 F (36.3 C) 97.9 F (36.6 C) 98.4 F (36.9 C)  TempSrc: Oral Oral Oral Oral  Resp: 18 18 17 17   Height:      Weight:      SpO2: 99% 99% 98% 98%   No intake or output data in the 24 hours ending 09/07/12 1225 Filed Weights   09/04/12 2000  Weight: 74.1 kg (163 lb 5.8 oz)    Exam: Physical Exam: General: Awake, Oriented, No acute distress. HEENT: EOMI. Neck: Supple CV: S1 and S2 Lungs: Clear to ascultation bilaterally Abdomen: Soft, Nontender, Nondistended, +bowel sounds. Ext: Good pulses. Trace edema. Neuro: Left sided weakness.  Data Reviewed: Basic Metabolic Panel:  Lab 09/04/12 8119 09/02/12 1730  NA 138 138  K 3.2* 3.4*  CL 104 101  CO2 22 23  GLUCOSE 93 114*  BUN 6 12  CREATININE 0.48* 0.51  CALCIUM 8.8 9.1  MG -- --  PHOS -- --   Liver  Function Tests:  Lab 09/02/12 1730  AST 27  ALT 42*  ALKPHOS 56  BILITOT 0.4  PROT 6.7  ALBUMIN 3.4*   No results found for this basename: LIPASE:5,AMYLASE:5 in the last 168 hours No results found for this basename: AMMONIA:5 in the last 168 hours CBC:  Lab 09/04/12 0430 09/02/12 1730  WBC 7.3 11.0*  NEUTROABS -- 6.7  HGB 12.4 14.6  HCT 36.8 42.1  MCV 92.0 92.1  PLT 224 261   Cardiac Enzymes: No results found for this basename: CKTOTAL:5,CKMB:5,CKMBINDEX:5,TROPONINI:5 in the last 168 hours BNP (last 3 results) No results found for this basename: PROBNP:3 in the last 8760 hours CBG:  Lab 09/07/12 1135 09/07/12 0700 09/06/12 2122 09/06/12 1629 09/06/12 1151  GLUCAP 83 92 114* 88 88    Recent Results (from the past 240 hour(s))  URINE CULTURE     Status: Normal   Collection Time   09/02/12  7:55 PM      Component Value Range Status Comment   Specimen Description URINE, CLEAN CATCH   Final    Special Requests NONE   Final    Culture  Setup Time 09/02/2012 21:14   Final    Colony Count >=100,000 COLONIES/ML   Final    Culture ENTEROCOCCUS SPECIES   Final    Report Status 09/05/2012 FINAL   Final    Organism ID, Bacteria ENTEROCOCCUS SPECIES   Final  URINE CULTURE     Status: Normal   Collection Time   09/02/12 10:12 PM      Component Value Range Status Comment   Specimen Description URINE, CATHETERIZED   Final    Special Requests NONE   Final    Culture  Setup Time 09/02/2012 22:46   Final    Colony Count 95,000 COLONIES/ML   Final    Culture ENTEROCOCCUS SPECIES   Final    Report Status 09/05/2012 FINAL   Final    Organism ID, Bacteria ENTEROCOCCUS SPECIES   Final   MRSA PCR SCREENING     Status: Normal   Collection Time   09/03/12  3:16 PM      Component Value Range Status Comment   MRSA by PCR NEGATIVE  NEGATIVE Final      Studies: No results found.  Scheduled Meds:   . baclofen  20 mg Oral TID  . ciprofloxacin  400 mg Intravenous Q12H  . clopidogrel   75 mg Oral Q breakfast  . diazepam  5 mg Oral QHS  . docusate sodium  100 mg Oral BID  . levETIRAcetam  750 mg Oral BID  . potassium chloride  10 mEq Oral BID  . senna-docusate  1 tablet Oral q morning - 10a  . simvastatin  20 mg Oral QHS   Continuous Infusions:   . sodium chloride 75 mL/hr at 09/06/12 1223    Principal Problem:  *CVA (cerebral infarction) Active Problems:  HYPERTENSION  Paralysis of upper limb  UTI (lower urinary tract infection)  Facial droop  Seizure    Islam Eichinger A, MD  [facility] Pager [phone]. If 7PM-7AM, please contact night-coverage at [email or web address], password [facility] 09/07/2012, 12:25 PM  LOS: 5 days

## 2012-11-01 ENCOUNTER — Other Ambulatory Visit: Payer: Self-pay | Admitting: *Deleted

## 2012-11-01 MED ORDER — HYDROCODONE-ACETAMINOPHEN 7.5-325 MG PO TABS: ORAL_TABLET | ORAL | Status: DC

## 2012-11-11 ENCOUNTER — Non-Acute Institutional Stay (SKILLED_NURSING_FACILITY): Payer: Medicare Other | Admitting: Nurse Practitioner

## 2012-11-11 DIAGNOSIS — E785 Hyperlipidemia, unspecified: Secondary | ICD-10-CM

## 2012-11-11 DIAGNOSIS — R569 Unspecified convulsions: Secondary | ICD-10-CM

## 2012-11-11 DIAGNOSIS — G4700 Insomnia, unspecified: Secondary | ICD-10-CM

## 2012-11-11 DIAGNOSIS — F411 Generalized anxiety disorder: Secondary | ICD-10-CM

## 2012-11-11 DIAGNOSIS — I6350 Cerebral infarction due to unspecified occlusion or stenosis of unspecified cerebral artery: Secondary | ICD-10-CM

## 2012-11-11 DIAGNOSIS — E876 Hypokalemia: Secondary | ICD-10-CM

## 2012-11-11 DIAGNOSIS — G894 Chronic pain syndrome: Secondary | ICD-10-CM

## 2012-11-11 DIAGNOSIS — I10 Essential (primary) hypertension: Secondary | ICD-10-CM

## 2012-11-11 DIAGNOSIS — I639 Cerebral infarction, unspecified: Secondary | ICD-10-CM

## 2012-11-11 DIAGNOSIS — K5900 Constipation, unspecified: Secondary | ICD-10-CM

## 2012-11-11 DIAGNOSIS — I2510 Atherosclerotic heart disease of native coronary artery without angina pectoris: Secondary | ICD-10-CM

## 2012-11-11 DIAGNOSIS — M62838 Other muscle spasm: Secondary | ICD-10-CM

## 2012-11-14 ENCOUNTER — Encounter: Payer: Self-pay | Admitting: Nurse Practitioner

## 2012-11-14 MED ORDER — DIAZEPAM 5 MG PO TABS: 5.0000 mg | ORAL_TABLET | Freq: Every day | ORAL | Status: DC

## 2012-11-14 MED ORDER — DSS 100 MG PO CAPS: 100.0000 mg | ORAL_CAPSULE | Freq: Two times a day (BID) | ORAL | Status: DC

## 2012-11-14 MED ORDER — HYDROCODONE-ACETAMINOPHEN 7.5-325 MG PO TABS: ORAL_TABLET | ORAL | Status: DC

## 2012-11-14 MED ORDER — POTASSIUM CHLORIDE CRYS ER 10 MEQ PO TBCR: 10.0000 meq | EXTENDED_RELEASE_TABLET | Freq: Two times a day (BID) | ORAL | Status: DC

## 2012-11-14 MED ORDER — METHOCARBAMOL 500 MG PO TABS: 500.0000 mg | ORAL_TABLET | Freq: Three times a day (TID) | ORAL | Status: DC

## 2012-11-14 MED ORDER — CLOPIDOGREL BISULFATE 75 MG PO TABS: 75.0000 mg | ORAL_TABLET | Freq: Every day | ORAL | Status: DC

## 2012-11-14 MED ORDER — LEVETIRACETAM 750 MG PO TABS: 750.0000 mg | ORAL_TABLET | Freq: Two times a day (BID) | ORAL | Status: DC

## 2012-11-14 MED ORDER — POLYETHYLENE GLYCOL 3350 17 G PO PACK: 17.0000 g | PACK | Freq: Every day | ORAL | Status: DC

## 2012-11-14 MED ORDER — IBUPROFEN 800 MG PO TABS: 800.0000 mg | ORAL_TABLET | Freq: Three times a day (TID) | ORAL | Status: DC | PRN

## 2012-11-14 MED ORDER — BACLOFEN 20 MG PO TABS: 20.0000 mg | ORAL_TABLET | Freq: Four times a day (QID) | ORAL | Status: DC

## 2012-11-14 MED ORDER — MELATONIN 3 MG PO TABS: 3.0000 mg | ORAL_TABLET | Freq: Every day | ORAL | Status: DC

## 2012-11-14 MED ORDER — ATENOLOL-CHLORTHALIDONE 50-25 MG PO TABS: 1.0000 | ORAL_TABLET | Freq: Every day | ORAL | Status: DC

## 2012-11-14 MED ORDER — ATORVASTATIN CALCIUM 10 MG PO TABS: 10.0000 mg | ORAL_TABLET | Freq: Every day | ORAL | Status: DC

## 2012-11-14 MED ORDER — LORAZEPAM 0.5 MG PO TABS: 0.5000 mg | ORAL_TABLET | Freq: Four times a day (QID) | ORAL | Status: DC | PRN

## 2012-11-14 MED ORDER — LIDOCAINE 5 % EX PTCH: 1.0000 | MEDICATED_PATCH | CUTANEOUS | Status: DC

## 2012-11-14 NOTE — Progress Notes (Signed)
  Subjective:    Patient ID: Stacy Diaz, female    DOB: 1950-09-12, 62 y.o.   MRN: [id number]  HPI Comments: Pt was seen today for discharge from [facility]. She has hx of cva on plavix, and meningioma removal 14 years ago with chronic bilateral LE weakness post surgery. She has hx of seizures and is on keppra, pt has not had any witnessed seizures. She has been stable. She will go home with her husband and home health services.     Review of Systems  Constitutional: Negative for activity change and appetite change.  HENT: Negative.   Respiratory: Negative.   Cardiovascular: Negative.   Gastrointestinal: Negative.   Endocrine: Negative.   Genitourinary: Negative.   Neurological: Positive for speech difficulty and weakness.       Hx of seizures. Hx of Muscle spasticity. Cva w/ hemiparesis  Psychiatric/Behavioral: Negative.        Objective:   Physical Exam  Vitals reviewed. Constitutional: She appears well-developed and well-nourished. No distress.  Eyes: Pupils are equal, round, and reactive to light.  Cardiovascular: Normal rate, regular rhythm and normal heart sounds.   Pulmonary/Chest: Effort normal and breath sounds normal.  Musculoskeletal:  No change in Left upper arm and bilateral LE paresis.  Neurological: She is alert. Coordination abnormal.  Skin: Skin is warm and dry. She is not diaphoretic.  Psychiatric: She has a normal mood and affect.   Labs: 10/18/12: na 137, k 4.0, glucose 80, bun 7, creatinine 0.53, calcium 9.8. 09/13/12: wbc 6.3, hgb 14.5, hct 42.2, plt 287, na 138, k 3.5, glucose 95, bun 15, creatinine 0.53, magnesium 1.9, alk phos 47, ast 14, alt 16, total protein 6.2, albumin 4.1, calcium 9.7. 09/09/12: keppra level 9.1        Assessment & Plan:  1) Discharge home with home health CNA,PT/OT/RN.   2) DME: swedish knee cages size medium, rotating pivot disc (transfer). Hoyer lift. 3 in1 commode (standard). Transfer belt, Gel overlay for bed. DX code: 781.3  lack of coordination, 728.87 muscle weakness, 438.9 late effect of cva.  3) Will increase scheduled robaxin to 500mg  po tid for musculoskeletal pain and spasms. And d/c prn robaxin.  4) 30 days supply of current rx's faxed to [facility]. Rx's were written for controlled substances and given to pt.  5) Pt is to follow up with pcp in 1-2 weeks, and neurology and directed.

## 2012-11-16 ENCOUNTER — Telehealth: Payer: Self-pay | Admitting: Family Medicine

## 2012-11-16 NOTE — Telephone Encounter (Addendum)
[facility] needs verbal order to continue home health nursing for another 8 weeks. Pls call Angelique Blonder on cell In addition they need Child psychotherapist eval (for additional community services)  Please follow up w/ Angelique Blonder asap to get this going.  Thanks

## 2012-11-17 ENCOUNTER — Telehealth: Payer: Self-pay | Admitting: Family Medicine

## 2012-11-17 NOTE — Telephone Encounter (Signed)
Jan with [facility] is calling for a verbal order for a home health aide. Please assist.

## 2012-11-17 NOTE — Telephone Encounter (Signed)
Stacy Diaz please call okay to continue at home therapy

## 2012-11-17 NOTE — Telephone Encounter (Signed)
Verbal order given to Jan

## 2012-11-17 NOTE — Telephone Encounter (Signed)
Spoke with Jan and verbal orders given

## 2012-11-23 ENCOUNTER — Telehealth: Payer: Self-pay | Admitting: Family Medicine

## 2012-11-23 NOTE — Telephone Encounter (Signed)
Margaret, OT with [facility] called stating that she completed an OT eval and would like to keep the patient on OT Home Health for 2 times per week for 7 weeks. Please assist.

## 2012-11-23 NOTE — Telephone Encounter (Signed)
Okay to give verbal order.

## 2012-11-23 NOTE — Telephone Encounter (Signed)
okay

## 2012-11-23 NOTE — Telephone Encounter (Signed)
Left message on machine okay verbal order

## 2012-11-25 DIAGNOSIS — R569 Unspecified convulsions: Secondary | ICD-10-CM

## 2012-11-25 DIAGNOSIS — R269 Unspecified abnormalities of gait and mobility: Secondary | ICD-10-CM

## 2012-11-25 DIAGNOSIS — I69998 Other sequelae following unspecified cerebrovascular disease: Secondary | ICD-10-CM

## 2012-11-25 DIAGNOSIS — I10 Essential (primary) hypertension: Secondary | ICD-10-CM

## 2012-11-28 ENCOUNTER — Other Ambulatory Visit: Payer: Self-pay | Admitting: *Deleted

## 2012-11-28 DIAGNOSIS — R569 Unspecified convulsions: Secondary | ICD-10-CM

## 2012-11-28 MED ORDER — LEVETIRACETAM 750 MG PO TABS: 750.0000 mg | ORAL_TABLET | Freq: Two times a day (BID) | ORAL | Status: DC

## 2012-12-05 ENCOUNTER — Encounter: Payer: Self-pay | Admitting: Family Medicine

## 2012-12-05 ENCOUNTER — Other Ambulatory Visit: Payer: Self-pay | Admitting: Nurse Practitioner

## 2012-12-05 ENCOUNTER — Ambulatory Visit (INDEPENDENT_AMBULATORY_CARE_PROVIDER_SITE_OTHER): Payer: Medicare Other | Admitting: Family Medicine

## 2012-12-05 VITALS — BP 132/88 | HR 60 | Temp 98.3°F

## 2012-12-05 DIAGNOSIS — G8320 Monoplegia of upper limb affecting unspecified side: Secondary | ICD-10-CM

## 2012-12-05 DIAGNOSIS — R569 Unspecified convulsions: Secondary | ICD-10-CM

## 2012-12-05 DIAGNOSIS — D332 Benign neoplasm of brain, unspecified: Secondary | ICD-10-CM

## 2012-12-05 DIAGNOSIS — I6350 Cerebral infarction due to unspecified occlusion or stenosis of unspecified cerebral artery: Secondary | ICD-10-CM

## 2012-12-05 DIAGNOSIS — I639 Cerebral infarction, unspecified: Secondary | ICD-10-CM

## 2012-12-05 DIAGNOSIS — G4700 Insomnia, unspecified: Secondary | ICD-10-CM

## 2012-12-05 NOTE — Patient Instructions (Signed)
Continue current medications  *Your physical therapist what devices you need and have her e-mail me the request and we will get that set up for you  I put a request in for you to see Dr. Porfirio Mylar,,,,,,,,,,, at [facility] neurologic for followup

## 2012-12-05 NOTE — Progress Notes (Signed)
  Subjective:    Patient ID: Stacy Diaz, female    DOB: 1950/08/26, 62 y.o.   MRN: [id number]  HPI Stacy Diaz is a 62 year old married female nonsmoker who comes in following a stroke and a seizure  She states on January 9 of this year she was taken to the hospital and admitted with symptoms of a stroke. She was discharged after 5 days to a rehabilitation center. On the third day she was there her symptoms became worse she was readmitted and on the second day of her second hospitalization she had a seizure. She states they changed her medicine and is currently on Keppra 750 mg twice a day. She was in rehabilitation for a total of 3 months.  There is a special orthopedic device that she needs  Also it was suggested that she have a neurologic followup.     Review of Systems Review of systems otherwise negative    Objective:   Physical Exam Well-developed well-nourished female no acute distress she is awake alert oriented with intact memory. Vital signs stable braces right and left lower extremities       Assessment & Plan:  History of CVA with secondary seizure disorder continue current therapy continue physical therapy neurologic evaluation by Dr. Carney Corners minor

## 2012-12-06 ENCOUNTER — Other Ambulatory Visit: Payer: Self-pay | Admitting: *Deleted

## 2012-12-07 ENCOUNTER — Telehealth: Payer: Self-pay | Admitting: Family Medicine

## 2012-12-07 DIAGNOSIS — G4700 Insomnia, unspecified: Secondary | ICD-10-CM

## 2012-12-07 DIAGNOSIS — M62838 Other muscle spasm: Secondary | ICD-10-CM

## 2012-12-07 DIAGNOSIS — F411 Generalized anxiety disorder: Secondary | ICD-10-CM

## 2012-12-07 DIAGNOSIS — G894 Chronic pain syndrome: Secondary | ICD-10-CM

## 2012-12-07 MED ORDER — LIDOCAINE 5 % EX PTCH: 1.0000 | MEDICATED_PATCH | CUTANEOUS | Status: DC

## 2012-12-07 MED ORDER — MELATONIN 3 MG PO TABS: 3.0000 mg | ORAL_TABLET | Freq: Every day | ORAL | Status: DC

## 2012-12-07 MED ORDER — METHOCARBAMOL 500 MG PO TABS: 500.0000 mg | ORAL_TABLET | Freq: Three times a day (TID) | ORAL | Status: DC

## 2012-12-07 MED ORDER — DIAZEPAM 5 MG PO TABS: 5.0000 mg | ORAL_TABLET | Freq: Every day | ORAL | Status: DC

## 2012-12-07 NOTE — Telephone Encounter (Signed)
Pt husband called to request the following medications be refilled; diazepam (VALIUM) 5 MG tablet 1 tablet a day, lidocaine (LIDODERM) 5 % 1 patch a day, Melatonin 3 MG TABS 1 tablet per day, and methocarbamol (ROBAXIN) tablet 500 mg 3 times a day. Please assist.

## 2012-12-09 ENCOUNTER — Other Ambulatory Visit: Payer: Self-pay | Admitting: *Deleted

## 2012-12-09 MED ORDER — METHOCARBAMOL 500 MG PO TABS: 500.0000 mg | ORAL_TABLET | Freq: Three times a day (TID) | ORAL | Status: DC

## 2012-12-11 ENCOUNTER — Other Ambulatory Visit: Payer: Self-pay | Admitting: Nurse Practitioner

## 2012-12-11 DIAGNOSIS — E785 Hyperlipidemia, unspecified: Secondary | ICD-10-CM

## 2012-12-13 ENCOUNTER — Telehealth: Payer: Self-pay | Admitting: Family Medicine

## 2012-12-13 DIAGNOSIS — E785 Hyperlipidemia, unspecified: Secondary | ICD-10-CM

## 2012-12-13 DIAGNOSIS — I2510 Atherosclerotic heart disease of native coronary artery without angina pectoris: Secondary | ICD-10-CM

## 2012-12-13 MED ORDER — CLOPIDOGREL BISULFATE 75 MG PO TABS: 75.0000 mg | ORAL_TABLET | Freq: Every day | ORAL | Status: DC

## 2012-12-13 MED ORDER — ATORVASTATIN CALCIUM 10 MG PO TABS: 10.0000 mg | ORAL_TABLET | Freq: Every day | ORAL | Status: DC

## 2012-12-13 NOTE — Telephone Encounter (Signed)
PT husband called in regarding her wheel chair request. Dr. Tawanna Cooler filled out a form for Medicare, but medicare requires these notes to be typed up in his dictation. Pt husband stated that Medicare is faxing [place] an additional form, and he would like to know if Dr. Tawanna Cooler will need to see her to complete this, or could he use his notes? Please assist.

## 2012-12-13 NOTE — Telephone Encounter (Signed)
Pt HUSBAND CALLED TO REQUEST A REFILL OF HER atorvastatin (LIPITOR) 10 MG tablet, AND clopidogrel (PLAVIX) 75 MG tablet. PLEASE CALL THESE IN TO [facility]. HE'S ALSO REQUESTING A NEW RX FOR A [place] [employer] KNEE CAGE (2 OF THEM R/L) DR. TODD SUGGESTED THIS AT THE LAST VISIT. PT'S HUSBAND WILL PICK THE RX UP FOR THE KNEE BRACES. PLEASE ASSIST.

## 2012-12-13 NOTE — Telephone Encounter (Signed)
Please let the patient know that no appointment is needed at this time

## 2012-12-13 NOTE — Telephone Encounter (Signed)
Spoke with husband

## 2012-12-14 ENCOUNTER — Encounter: Payer: Self-pay | Admitting: Nurse Practitioner

## 2012-12-16 ENCOUNTER — Telehealth: Payer: Self-pay | Admitting: Family Medicine

## 2012-12-16 DIAGNOSIS — M62838 Other muscle spasm: Secondary | ICD-10-CM

## 2012-12-16 DIAGNOSIS — F411 Generalized anxiety disorder: Secondary | ICD-10-CM

## 2012-12-16 DIAGNOSIS — G894 Chronic pain syndrome: Secondary | ICD-10-CM

## 2012-12-16 MED ORDER — HYDROCODONE-ACETAMINOPHEN 7.5-325 MG PO TABS: ORAL_TABLET | ORAL | Status: DC

## 2012-12-16 MED ORDER — LORAZEPAM 0.5 MG PO TABS: 0.5000 mg | ORAL_TABLET | Freq: Four times a day (QID) | ORAL | Status: DC | PRN

## 2012-12-16 MED ORDER — BACLOFEN 20 MG PO TABS: 20.0000 mg | ORAL_TABLET | Freq: Four times a day (QID) | ORAL | Status: DC

## 2012-12-16 NOTE — Telephone Encounter (Signed)
Verbal order given  

## 2012-12-16 NOTE — Telephone Encounter (Signed)
Patient's spouse called stating that she need refills of her hydrocodone 7.5-325 mg 1po q 4 hrs prn for pain, baclofen 20 mg 1po 4times qd, lorazepam 0.5 mg 1po 4 times qd sent to [facility] on [address]. Please assist.

## 2012-12-16 NOTE — Telephone Encounter (Signed)
P/T calling from [facility] requesting orders for an extension of p/t for twice a week for 4 more weeks. Please assist.

## 2012-12-19 NOTE — Progress Notes (Signed)
Due to neurological disorders, paralysis of upper body, history of CVA and seizure disorder our patient will require a power wheelchair for mobility.  She is unable to use ambulatory assistance and has had a recent stroke.

## 2012-12-20 ENCOUNTER — Telehealth: Payer: Self-pay | Admitting: *Deleted

## 2012-12-20 DIAGNOSIS — G894 Chronic pain syndrome: Secondary | ICD-10-CM

## 2012-12-20 MED ORDER — HYDROCODONE-ACETAMINOPHEN 7.5-325 MG PO TABS: ORAL_TABLET | ORAL | Status: DC

## 2012-12-20 NOTE — Telephone Encounter (Signed)
Pharmacy calling to okay hydrocodone #180  Okay per Dr Tawanna Cooler

## 2012-12-21 ENCOUNTER — Telehealth: Payer: Self-pay | Admitting: Family Medicine

## 2012-12-21 DIAGNOSIS — F411 Generalized anxiety disorder: Secondary | ICD-10-CM

## 2012-12-21 NOTE — Telephone Encounter (Signed)
PT husband called to request that pt's LORazepam (ATIVAN) 0.5 MG tablet RX be corrected. The instructions stated to take it every 6 hours, but there is only a quantity of 90 instead of 120. Pt husband is requesting that this be corrected, and he would also like a 3 month supply if that's possible. Please assist. He also states that the physical therapist has ran out of visits, and would require an additional RX from Dr. Tawanna Cooler.

## 2012-12-22 MED ORDER — LORAZEPAM 0.5 MG PO TABS: 0.5000 mg | ORAL_TABLET | Freq: Four times a day (QID) | ORAL | Status: DC | PRN

## 2012-12-28 ENCOUNTER — Telehealth: Payer: Self-pay | Admitting: Family Medicine

## 2012-12-28 DIAGNOSIS — R194 Change in bowel habit: Secondary | ICD-10-CM

## 2012-12-28 MED ORDER — PEG 3350-KCL-NABCB-NACL-NASULF 227.1 G PO PACK: PACK | ORAL | Status: DC

## 2012-12-28 MED ORDER — POLYETHYLENE GLYCOL 3350 17 G PO PACK: 17.0000 g | PACK | Freq: Every day | ORAL | Status: DC

## 2012-12-28 NOTE — Telephone Encounter (Signed)
Pt has gone 7 days w/out a BM. Husband request you to call [facility] home health in order for them to help w/ suppository  or whatever MD suggest. Husband would like a return call.

## 2012-12-28 NOTE — Telephone Encounter (Signed)
Per Dr Tawanna Cooler,  Home health for rectal exam for impaction.  If impacted then a digital disimpaction should be preformed.  Husband is aware and rx sent to pharmacy.

## 2013-01-05 ENCOUNTER — Other Ambulatory Visit (INDEPENDENT_AMBULATORY_CARE_PROVIDER_SITE_OTHER): Payer: Medicare Other | Admitting: Radiology

## 2013-01-05 ENCOUNTER — Encounter: Payer: Self-pay | Admitting: Neurology

## 2013-01-05 ENCOUNTER — Ambulatory Visit (INDEPENDENT_AMBULATORY_CARE_PROVIDER_SITE_OTHER): Payer: Medicare Other | Admitting: Neurology

## 2013-01-05 VITALS — BP 126/82 | HR 67 | Temp 98.4°F

## 2013-01-05 DIAGNOSIS — G40209 Localization-related (focal) (partial) symptomatic epilepsy and epileptic syndromes with complex partial seizures, not intractable, without status epilepticus: Secondary | ICD-10-CM

## 2013-01-05 DIAGNOSIS — I6350 Cerebral infarction due to unspecified occlusion or stenosis of unspecified cerebral artery: Secondary | ICD-10-CM

## 2013-01-05 DIAGNOSIS — I639 Cerebral infarction, unspecified: Secondary | ICD-10-CM

## 2013-01-05 NOTE — Progress Notes (Signed)
[facility]  PATIENT: Stacy Diaz DOB: 02-22-1951   HISTORY FROM: patient, husband and chart REASON FOR VISIT: hospital follow up for stroke and seizure  HISTORY OF PRESENT ILLNESS:  [name] is a 62 year old female with history of previous meningioma resection with subsequent spastic paraparesis who developed left arm weakness and facial droop on the morning of 08/25/2012. She had multiple episodes in the past similar to the episode above but this one had been persistent.  MRI was not an option since she had history of no clips to 2 meningioma resection. Since her surgery she has had trouble with weakness from waist down. At her baseline she had trouble angulating for the past 4 years but could bear weight on her legs but needed help with transfers. Lately her legs have been nonweight bearing at all.   She was admitted to [facility] for workup and CT was negative for acute infarct. She was discharged  to [facility] after 3 days. On the third day there her symptoms became worse and she was readmitted to the hospital. On the second day of her second hospitalization she had a seizure that was witnessed and included loss of consciousness with a blank stare and lasted approximately 45 minutes. She suffered another seizure that was shorter in length approximately 10 minutes on the following day. She has not had any known recurrent seizures since that time. She was discharged on Keppra 750 mg twice a day and continues with that dose without any side effects.  She was discharged on Plavix 75 mg daily and continues with that dose.  Patient denies medication side effects, with no signs of bleeding.  Patient is in outpatient therapy.  REVIEW OF SYSTEMS: Full 14 system review of systems performed and notable only for constitutional: Fatigue eyes: Blurred vision respiratory: snoring, hematology: Easy bruising gastrointestinal: Incontinence, constipation musculoskeletal: Joint  pain, cramps, aching muscles skin: Rash, moles, Itching genitourinary: Incontinence allergy/immunology: Allergies, neurological:  weakness, seizure sleep: Insomnia, snoring psychiatric: anxiety,    ALLERGIES: No Known Allergies  HOME MEDICATIONS: Outpatient Prescriptions Prior to Visit  Medication Sig Dispense Refill  . atenolol-chlorthalidone (TENORETIC) 50-25 MG per tablet Take 1 tablet by mouth daily.  30 tablet  0  . atorvastatin (LIPITOR) 10 MG tablet Take 1 tablet (10 mg total) by mouth daily.  100 tablet  3  . baclofen (LIORESAL) 20 MG tablet Take 1 tablet (20 mg total) by mouth 4 (four) times daily.  120 each  3  . clopidogrel (PLAVIX) 75 MG tablet Take 1 tablet (75 mg total) by mouth daily with breakfast.  100 tablet  3  . diazepam (VALIUM) 5 MG tablet Take 1 tablet (5 mg total) by mouth at bedtime.  90 tablet  5  . Docusate Sodium (DSS) 100 MG CAPS Take 100 mg by mouth 2 (two) times daily.      Marland Kitchen HYDROcodone-acetaminophen (NORCO) 7.5-325 MG per tablet Take one tablet by mouth every 4 hours as needed for pain  180 tablet  3  . ibuprofen (ADVIL,MOTRIN) 800 MG tablet Take 1 tablet (800 mg total) by mouth every 8 (eight) hours as needed. For pain  90 tablet  0  . levETIRAcetam (KEPPRA) 750 MG tablet Take 1 tablet (750 mg total) by mouth 2 (two) times daily.  180 tablet  3  . lidocaine (LIDODERM) 5 % Place 1 patch onto the skin daily. Remove & Discard patch within 12 hours or as directed by MD  90 patch  5  . LORazepam (ATIVAN) 0.5 MG tablet Take 1 tablet (0.5 mg total) by mouth every 6 (six) hours as needed for anxiety.  120 tablet  3  . Melatonin 3 MG TABS Take 1 tablet (3 mg total) by mouth at bedtime.  90 tablet  3  . methocarbamol (ROBAXIN) 500 MG tablet Take 1 tablet (500 mg total) by mouth 3 (three) times daily.  90 tablet  0  . PEG 3350-KCl-NaBcb-NaCl-NaSulf (GOLYTELY) 227.1 G PACK Use as directed  10 each  0  . polyethylene glycol (MIRALAX / GLYCOLAX) packet Take 17 g by mouth  daily.  14 each    . polyethylene glycol (MIRALAX / GLYCOLAX) packet Take 17 g by mouth daily.  100 each  0  . potassium chloride (K-DUR,KLOR-CON) 10 MEQ tablet Take 1 tablet (10 mEq total) by mouth 2 (two) times daily.  60 tablet  0   No facility-administered medications prior to visit.    PAST MEDICAL HISTORY: Past Medical History  Diagnosis Date  . HTN (hypertension)   . Hyperlipidemia   . Meningioma 1999  . Myocardial infarction 1985?  Marland Kitchen Headache     "not real frequent" (08/25/2012)  . Arthritis     "probably" (08/25/2012)  . Stroke 08/25/2012    "that's what they think I've had; sudden LUE weakness" (08/25/2012)  . Spastic paraparesis     S/P meningioma resection 1999/notes 08/25/2012  . Paresis of lower extremity 1999    BLE/notes 08/25/2012  . Anxiety   . Heart disease     PAST SURGICAL HISTORY: Past Surgical History  Procedure Laterality Date  . Coronary angioplasty  1970's  . Cardiac catheterization  1970's  . Brain meningioma excision  1999    FAMILY HISTORY: Family History  Problem Relation Age of Onset  . Breast cancer Mother   . Heart disease Father     SOCIAL HISTORY: History   Social History  . Marital Status: Married    Spouse Name: N/A    Number of Children: 2  . Years of Education: college   Occupational History  . Not on file.   Social History Main Topics  . Smoking status: Former Smoker -- 2.00 packs/day for 10 years  . Smokeless tobacco: Never Used     Comment: 08/25/2012 "quit smoking cigarettes in 1985 when I had my heart attack"  . Alcohol Use: 4.2 oz/week    7 Glasses of wine per week     Comment: 08/25/2012 "glass of wine qd"  . Drug Use: No  . Sexually Active: Yes   Other Topics Concern  . Not on file   Social History Narrative  . No narrative on file     PHYSICAL EXAM  Filed Vitals:   01/05/13 1337  BP: 126/82  Pulse: 67  Temp: 98.4 F (36.9 C)  TempSrc: Oral   There is no weight on file to calculate BMI.  GENERAL EXAM:  Patient is in no distress, pleasant, wheelchair bound HEAD: Symmetric facial features. EARS, NOSE, and THROAT: Normal.  NECK: Supple, no JVD RESPIRATORY: Lungs CTA. CARDIOVASCULAR: Regular rate and rhythm, no murmurs, no carotid bruits SKIN: No rash, no bruising  NEUROLOGIC: MENTAL STATUS: awake, alert and oriented to person, place and time, language fluent, comprehension intact, naming intact CRANIAL NERVE:  pupils equal and reactive to light, visual fields full to confrontation, mild facial asymmetry on the left, uvula midline, shoulder shrug symmetric, tongue midline. MOTOR: Braces right and left lower extremities, 4/5 strength left upper  extremity,  fine finger movements decreased on left SENSORY: normal and symmetric to light touch COORDINATION: finger-nose-finger normal REFLEXES: deep tendon reflexes BUE present and symmetric 2+, BLE not tested. GAIT/STATION:  Wheelchair-bound.   DIAGNOSTIC DATA (LABS, IMAGING, TESTING) - I reviewed patient records, labs, notes, testing and imaging myself where available.  Lab Results  Component Value Date   WBC 7.3 09/04/2012   HGB 12.4 09/04/2012   HCT 36.8 09/04/2012   MCV 92.0 09/04/2012   PLT 224 09/04/2012      Component Value Date/Time   NA 138 09/04/2012 0430   K 3.2* 09/04/2012 0430   CL 104 09/04/2012 0430   CO2 22 09/04/2012 0430   GLUCOSE 93 09/04/2012 0430   BUN 6 09/04/2012 0430   CREATININE 0.48* 09/04/2012 0430   CALCIUM 8.8 09/04/2012 0430   PROT 6.7 09/02/2012 1730   ALBUMIN 3.4* 09/02/2012 1730   AST 27 09/02/2012 1730   ALT 42* 09/02/2012 1730   ALKPHOS 56 09/02/2012 1730   BILITOT 0.4 09/02/2012 1730   GFRNONAA >90 09/04/2012 0430   GFRAA >90 09/04/2012 0430   Lab Results  Component Value Date   CHOL 138 09/03/2012   HDL 65 09/03/2012   LDLCALC 50 09/03/2012   LDLDIRECT 142.6 03/26/2011   TRIG 116 09/03/2012   CHOLHDL 2.1 09/03/2012   Lab Results  Component Value Date   HGBA1C 5.6 08/26/2012   No results found for this  basename: [id number]   Lab Results  Component Value Date   TSH 0.72 05/10/2012     ASSESSMENT AND PLAN  Ms. Deyjah Kindel is a 62 year old female with history of previous meningioma resection with subsequent spastic paraparesis who developed left arm weakness and facial droop on the morning of 08/25/2012. Felt to be stroke although never confirmed on CT.  She was discharged on Plavix 75 mg daily. She experienced 2 partial complex seizures while in the hospital and was discharged on Keppra 750 mg twice a day. She has been seizure-free to her knowledge since that time and is tolerating Keppra well. Stroke risk factors include hypertension, and high cholesterol.  PLAN: Continue clopidogrel 75 mg orally every day  for secondary stroke prevention and maintain strict control of hypertension with blood pressure goal below 130/90, lipids with LDL cholesterol goal below 100 mg/dL.  Continue Keppra 750 mg BID for seizure. EEG to evaluate for silent seizures today. Return for follow up appt in 3 months.   Evagelia Knack NP-C 01/05/2013, 2:37 PM  [facility] [address] [place], [place] [address] [phone]  I have personally examined this patient, reviewed pertinent data, developed plan of care and discussed with patient and agree with above.  Delia Heady, MD

## 2013-01-05 NOTE — Patient Instructions (Addendum)
EEG today if possible.  Follow up in office 3 months.  Call for sooner appointment if seizure occurs.  STROKE/TIA INSTRUCTIONS SMOKING Cigarette smoking nearly doubles your risk of having a stroke & is the single most alterable risk factor  If you smoke or have smoked in the last 12 months, you are advised to quit smoking for your health.  Most of the excess cardiovascular risk related to smoking disappears within a year of stopping.  Ask you doctor about anti-smoking medications  [place] Quit Line: 1-800-QUIT NOW  Free Smoking Cessation Classes [phone]  CHOLESTEROL Know your levels; limit fat & cholesterol in your diet  Lab Results  Component Value Date   CHOL 138 09/03/2012   HDL 65 09/03/2012   LDLCALC 50 09/03/2012   LDLDIRECT 142.6 03/26/2011   TRIG 116 09/03/2012   CHOLHDL 2.1 09/03/2012      Many patients benefit from treatment even if their cholesterol is at goal.  Goal: Total Cholesterol less than 160  Goal:  LDL less than 100  Goal:  HDL greater than 40  Goal:  Triglycerides less than 150  BLOOD PRESSURE American Stroke Association blood pressure target is less that 120/80 mm/Hg  Your discharge blood pressure is:  BP: 126/82 mmHg  Monitor your blood pressure  Limit your salt and alcohol intake  Many individuals will require more than one medication for high blood pressure  DIABETES (A1c is a blood sugar average for last 3 months) Goal A1c is under 7% (A1c is blood sugar average for last 3 months)  Diabetes: No known diagnosis of diabetes    Lab Results  Component Value Date   HGBA1C 5.6 08/26/2012    Your A1c can be lowered with medications, healthy diet, and exercise.  Check your blood sugar as directed by your physician  Call your physician if you experience unexplained or low blood sugars.  PHYSICAL ACTIVITY/REHABILITATION Goal is 30 minutes at least 4 days per week    Activity decreases your risk of heart attack and stroke and makes your heart  stronger.  It helps control your weight and blood pressure; helps you relax and can improve your mood.  Participate in a regular exercise program.  Talk with your doctor about the best form of exercise for you (dancing, walking, swimming, cycling).  DIET/WEIGHT Goal is to maintain a healthy weight  Your height is:    Your current weight is:   Your body Mass Index (BMI) is:     Following the type of diet specifically designed for you will help prevent another stroke.  Your goal Body Mass Index (BMI) is 19-24.  Healthy food habits can help reduce 3 risk factors for stroke:  High cholesterol, hypertension, and excess weight.

## 2013-01-12 NOTE — Progress Notes (Signed)
Quick Note:  Spoke with patient's husband Onalee Hua) and relayed results of EEG. Mr Cundy understood and had no questions. ______

## 2013-01-13 ENCOUNTER — Telehealth: Payer: Self-pay | Admitting: Family Medicine

## 2013-01-13 NOTE — Telephone Encounter (Signed)
Okay per Dr Tawanna Cooler. Verbal order given

## 2013-01-13 NOTE — Telephone Encounter (Signed)
PT called to request visits for twice a week for 4 more weeks. Please assist.

## 2013-03-01 ENCOUNTER — Telehealth: Payer: Self-pay | Admitting: Family Medicine

## 2013-03-01 NOTE — Telephone Encounter (Signed)
Calling in regards to the pt's power wheel chair. She states that the pt is concerned about the completion of the paperwork. She also stated that Dr. Tawanna Cooler had to be the one to complete the paperwork Please assist.

## 2013-03-02 ENCOUNTER — Telehealth: Payer: Self-pay | Admitting: *Deleted

## 2013-03-02 NOTE — Telephone Encounter (Signed)
I called and spoke to [employer] relating to a wheelchair ordered by Dr. Tawanna Cooler.  Brooke, who is handling the p/w for this said to disregard.  MD who ordered the wheelchair needs to sign forms.

## 2013-03-03 ENCOUNTER — Telehealth: Payer: Self-pay | Admitting: Family Medicine

## 2013-03-03 NOTE — Telephone Encounter (Signed)
Re-faxed.

## 2013-03-03 NOTE — Telephone Encounter (Signed)
Stated that the face to face completion date needs to be corrected, on the 7 element written order. She stated that the date of 12/05/12 needs to be marked out, and the doctor needs to sign next to the correct date... And sign next to that. She will be faxing this back to you today. Please assist.

## 2013-03-13 ENCOUNTER — Telehealth: Payer: Self-pay | Admitting: Neurology

## 2013-03-20 ENCOUNTER — Telehealth: Payer: Self-pay | Admitting: Neurology

## 2013-03-24 ENCOUNTER — Telehealth: Payer: Self-pay | Admitting: Family Medicine

## 2013-03-24 DIAGNOSIS — I639 Cerebral infarction, unspecified: Secondary | ICD-10-CM

## 2013-03-24 DIAGNOSIS — G8320 Monoplegia of upper limb affecting unspecified side: Secondary | ICD-10-CM

## 2013-03-24 NOTE — Telephone Encounter (Signed)
PT is requesting outpatient rehab orders. She states that she just completed in house rehab, and would now like to follow up with outpatient rehab. Please assist.

## 2013-03-24 NOTE — Telephone Encounter (Signed)
Spoke with patient and orders placed

## 2013-03-27 ENCOUNTER — Telehealth: Payer: Self-pay | Admitting: Family Medicine

## 2013-03-27 DIAGNOSIS — F411 Generalized anxiety disorder: Secondary | ICD-10-CM

## 2013-03-27 MED ORDER — LORAZEPAM 0.5 MG PO TABS: 0.5000 mg | ORAL_TABLET | Freq: Four times a day (QID) | ORAL | Status: DC | PRN

## 2013-03-27 NOTE — Telephone Encounter (Signed)
Calling in regards to the 7 element form for the pt supplies. She would like to speak with you in regards to correcting the form and codes. Please assist.

## 2013-03-27 NOTE — Telephone Encounter (Signed)
Rx called in 

## 2013-03-27 NOTE — Telephone Encounter (Signed)
Pt request LORazepam (ATIVAN) 0.5 MG tablet 90 day  4 /day Pt states she is out of meds and needs asap.  Pharm [facility]/ [address]

## 2013-03-28 ENCOUNTER — Telehealth: Payer: Self-pay

## 2013-03-28 NOTE — Telephone Encounter (Signed)
Message copied by [facility] on Tue Mar 28, 2013  2:07 PM ------      Message from: Seth Bake      Created: Wed Mar 22, 2013  3:01 PM      Contact: Spouse       Mr Flippen calling to check the status of the referral for outpatient therapy.  Are you going to order this or do they need to contact her PCP?  He would like a call back today.  ------

## 2013-03-28 NOTE — Telephone Encounter (Signed)
New order faxed. 

## 2013-03-28 NOTE — Telephone Encounter (Signed)
I called and left a VM for Mr. Piontek. I see patient's primary provider made referrals for outpatient therapy. It is best in the future that whomever made the original referral, that you go to that person for continued referrals or updated orders for therapy. That way, that particular provider with have the information specific to that care and can manage that care best. Please call with further questions or concerns.  Thank you.

## 2013-04-03 ENCOUNTER — Telehealth: Payer: Self-pay

## 2013-04-03 NOTE — Telephone Encounter (Signed)
Message copied by [facility] on Mon Apr 03, 2013  3:08 PM ------      Message from: Stacy Diaz      Created: Tue Mar 28, 2013  4:11 PM       Patient says Dr. Pearlean Brownie told her to call when she needed her outpatient therapy.  What should she do?   ------

## 2013-04-03 NOTE — Telephone Encounter (Signed)
I called the patient to let her know the referral has been made. Patient stated that they have called her and she is scheduled to begin therapy tomorrow. She thanked [place] very much for our prompt attention to this matter.

## 2013-04-04 ENCOUNTER — Ambulatory Visit: Payer: Medicare Other | Attending: Family Medicine

## 2013-04-04 DIAGNOSIS — M2420 Disorder of ligament, unspecified site: Secondary | ICD-10-CM | POA: Insufficient documentation

## 2013-04-04 DIAGNOSIS — R279 Unspecified lack of coordination: Secondary | ICD-10-CM | POA: Insufficient documentation

## 2013-04-04 DIAGNOSIS — Z5189 Encounter for other specified aftercare: Secondary | ICD-10-CM | POA: Insufficient documentation

## 2013-04-04 DIAGNOSIS — M629 Disorder of muscle, unspecified: Secondary | ICD-10-CM | POA: Insufficient documentation

## 2013-04-05 ENCOUNTER — Ambulatory Visit: Payer: Medicare Other | Admitting: Occupational Therapy

## 2013-04-05 ENCOUNTER — Ambulatory Visit: Payer: Medicare Other

## 2013-04-07 ENCOUNTER — Ambulatory Visit: Payer: Medicare Other

## 2013-04-07 ENCOUNTER — Ambulatory Visit: Payer: Medicare Other | Admitting: Nurse Practitioner

## 2013-04-07 ENCOUNTER — Ambulatory Visit: Payer: Medicare Other | Admitting: Occupational Therapy

## 2013-04-10 ENCOUNTER — Ambulatory Visit: Payer: Medicare Other | Admitting: Occupational Therapy

## 2013-04-10 ENCOUNTER — Ambulatory Visit: Payer: Medicare Other

## 2013-04-10 ENCOUNTER — Ambulatory Visit: Payer: Medicare Other | Admitting: Nurse Practitioner

## 2013-04-10 NOTE — Progress Notes (Signed)
This encounter was created in error - please disregard.

## 2013-04-13 ENCOUNTER — Ambulatory Visit: Payer: Medicare Other | Admitting: Occupational Therapy

## 2013-04-13 ENCOUNTER — Ambulatory Visit: Payer: Medicare Other | Admitting: Physical Therapy

## 2013-04-14 ENCOUNTER — Ambulatory Visit: Payer: Medicare Other

## 2013-04-18 ENCOUNTER — Telehealth: Payer: Self-pay | Admitting: Family Medicine

## 2013-04-18 ENCOUNTER — Ambulatory Visit: Payer: Medicare Other | Attending: Family Medicine | Admitting: *Deleted

## 2013-04-18 ENCOUNTER — Ambulatory Visit: Payer: Medicare Other | Admitting: Physical Therapy

## 2013-04-18 DIAGNOSIS — M629 Disorder of muscle, unspecified: Secondary | ICD-10-CM | POA: Insufficient documentation

## 2013-04-18 DIAGNOSIS — I639 Cerebral infarction, unspecified: Secondary | ICD-10-CM

## 2013-04-18 DIAGNOSIS — Z5189 Encounter for other specified aftercare: Secondary | ICD-10-CM | POA: Insufficient documentation

## 2013-04-18 DIAGNOSIS — M2420 Disorder of ligament, unspecified site: Secondary | ICD-10-CM | POA: Insufficient documentation

## 2013-04-18 DIAGNOSIS — G8320 Monoplegia of upper limb affecting unspecified side: Secondary | ICD-10-CM

## 2013-04-18 DIAGNOSIS — S8490XA Injury of unspecified nerve at lower leg level, unspecified leg, initial encounter: Secondary | ICD-10-CM

## 2013-04-18 DIAGNOSIS — R279 Unspecified lack of coordination: Secondary | ICD-10-CM | POA: Insufficient documentation

## 2013-04-18 NOTE — Telephone Encounter (Signed)
PT called and stated that she needed an RX for a roll in shower chair. [facility] Please assist.

## 2013-04-19 ENCOUNTER — Ambulatory Visit: Payer: Medicare Other | Admitting: Physical Therapy

## 2013-04-19 ENCOUNTER — Ambulatory Visit: Payer: Medicare Other | Admitting: Occupational Therapy

## 2013-04-19 ENCOUNTER — Telehealth: Payer: Self-pay | Admitting: Family Medicine

## 2013-04-19 NOTE — Telephone Encounter (Signed)
Pt's husband states the rolling shower chair should be set to [employer]. Should pt come by and p/u script or pls advise.

## 2013-04-19 NOTE — Telephone Encounter (Signed)
Spoke with husband and he will call and find out where to get a roll in shower chair.  Left message on machine for Angela.

## 2013-04-19 NOTE — Telephone Encounter (Signed)
Order ready for pick up and husband is aware

## 2013-04-19 NOTE — Telephone Encounter (Signed)
Ext. [id number]. (meeting from 11:00-12:30) Stacy Diaz states that she is questioning the order for a roll in shower chair, as the shower chairs do not have wheels. She stated that they have tub seats, but they do not have wheels. She states that you can leave a detailed voicemail. Please assist.

## 2013-04-21 ENCOUNTER — Ambulatory Visit: Payer: Medicare Other

## 2013-04-24 ENCOUNTER — Ambulatory Visit: Payer: Medicare Other

## 2013-04-24 ENCOUNTER — Ambulatory Visit: Payer: Medicare Other | Admitting: Occupational Therapy

## 2013-04-26 ENCOUNTER — Ambulatory Visit: Payer: Medicare Other | Admitting: Occupational Therapy

## 2013-04-26 ENCOUNTER — Encounter: Payer: Medicare Other | Admitting: Occupational Therapy

## 2013-04-26 ENCOUNTER — Ambulatory Visit: Payer: Medicare Other

## 2013-04-27 ENCOUNTER — Encounter: Payer: Medicare Other | Admitting: Occupational Therapy

## 2013-04-27 ENCOUNTER — Ambulatory Visit: Payer: Medicare Other

## 2013-05-02 ENCOUNTER — Ambulatory Visit: Payer: Medicare Other | Admitting: Occupational Therapy

## 2013-05-02 ENCOUNTER — Ambulatory Visit: Payer: Medicare Other | Admitting: Physical Therapy

## 2013-05-04 ENCOUNTER — Ambulatory Visit: Payer: Medicare Other | Admitting: Physical Therapy

## 2013-05-04 ENCOUNTER — Encounter: Payer: Medicare Other | Admitting: Occupational Therapy

## 2013-05-04 ENCOUNTER — Ambulatory Visit (INDEPENDENT_AMBULATORY_CARE_PROVIDER_SITE_OTHER): Payer: Medicare Other

## 2013-05-04 DIAGNOSIS — Z23 Encounter for immunization: Secondary | ICD-10-CM

## 2013-05-05 ENCOUNTER — Ambulatory Visit: Payer: Medicare Other | Admitting: Occupational Therapy

## 2013-05-05 ENCOUNTER — Ambulatory Visit: Payer: Medicare Other

## 2013-05-08 ENCOUNTER — Telehealth: Payer: Self-pay | Admitting: Family Medicine

## 2013-05-08 ENCOUNTER — Other Ambulatory Visit: Payer: Self-pay | Admitting: Family Medicine

## 2013-05-08 DIAGNOSIS — G894 Chronic pain syndrome: Secondary | ICD-10-CM

## 2013-05-08 MED ORDER — HYDROCODONE-ACETAMINOPHEN 7.5-325 MG PO TABS: ORAL_TABLET | ORAL | Status: DC

## 2013-05-08 NOTE — Telephone Encounter (Signed)
Pt request refill HYDROcodone-acetaminophen (NORCO) 7.5-325 MG per tablet 90 day / 180 /mo Pharm [facility]/[address]

## 2013-05-08 NOTE — Telephone Encounter (Signed)
Rx called in to pharmacy. 

## 2013-05-09 ENCOUNTER — Ambulatory Visit: Payer: Medicare Other | Admitting: Physical Therapy

## 2013-05-09 ENCOUNTER — Ambulatory Visit: Payer: Medicare Other | Admitting: Occupational Therapy

## 2013-05-11 ENCOUNTER — Ambulatory Visit: Payer: Medicare Other

## 2013-05-11 ENCOUNTER — Ambulatory Visit: Payer: Medicare Other | Admitting: Occupational Therapy

## 2013-05-11 ENCOUNTER — Ambulatory Visit: Payer: Medicare Other | Admitting: Physical Therapy

## 2013-05-15 ENCOUNTER — Ambulatory Visit: Payer: Medicare Other | Admitting: Occupational Therapy

## 2013-05-15 ENCOUNTER — Ambulatory Visit: Payer: Medicare Other | Admitting: Physical Therapy

## 2013-05-15 ENCOUNTER — Ambulatory Visit: Payer: Medicare Other

## 2013-05-16 ENCOUNTER — Encounter: Payer: Medicare Other | Admitting: Occupational Therapy

## 2013-05-16 ENCOUNTER — Ambulatory Visit: Payer: Medicare Other

## 2013-05-16 ENCOUNTER — Ambulatory Visit: Payer: Medicare Other | Admitting: Physical Therapy

## 2013-05-18 ENCOUNTER — Ambulatory Visit: Payer: Medicare Other | Attending: Family Medicine

## 2013-05-18 ENCOUNTER — Ambulatory Visit: Payer: Medicare Other | Admitting: Occupational Therapy

## 2013-05-18 DIAGNOSIS — M2420 Disorder of ligament, unspecified site: Secondary | ICD-10-CM | POA: Insufficient documentation

## 2013-05-18 DIAGNOSIS — R279 Unspecified lack of coordination: Secondary | ICD-10-CM | POA: Insufficient documentation

## 2013-05-18 DIAGNOSIS — Z5189 Encounter for other specified aftercare: Secondary | ICD-10-CM | POA: Insufficient documentation

## 2013-05-18 DIAGNOSIS — M629 Disorder of muscle, unspecified: Secondary | ICD-10-CM | POA: Insufficient documentation

## 2013-05-23 ENCOUNTER — Ambulatory Visit: Payer: Medicare Other

## 2013-05-23 ENCOUNTER — Ambulatory Visit: Payer: Medicare Other | Admitting: Occupational Therapy

## 2013-05-26 ENCOUNTER — Ambulatory Visit: Payer: Medicare Other

## 2013-05-26 ENCOUNTER — Ambulatory Visit: Payer: Medicare Other | Admitting: Occupational Therapy

## 2013-05-30 ENCOUNTER — Emergency Department (HOSPITAL_COMMUNITY): Payer: Medicare Other

## 2013-05-30 ENCOUNTER — Ambulatory Visit: Payer: Medicare Other | Admitting: Occupational Therapy

## 2013-05-30 ENCOUNTER — Observation Stay (HOSPITAL_COMMUNITY)
Admission: EM | Admit: 2013-05-30 | Discharge: 2013-05-31 | Disposition: A | Discharge status: Home / Self Care | Payer: Medicare Other | Attending: Family Medicine | Admitting: Family Medicine

## 2013-05-30 ENCOUNTER — Observation Stay (HOSPITAL_COMMUNITY): Payer: Medicare Other

## 2013-05-30 ENCOUNTER — Encounter (HOSPITAL_COMMUNITY): Payer: Self-pay | Admitting: Emergency Medicine

## 2013-05-30 ENCOUNTER — Ambulatory Visit: Payer: Medicare Other

## 2013-05-30 DIAGNOSIS — R29898 Other symptoms and signs involving the musculoskeletal system: Secondary | ICD-10-CM | POA: Diagnosis present

## 2013-05-30 DIAGNOSIS — E785 Hyperlipidemia, unspecified: Secondary | ICD-10-CM | POA: Diagnosis present

## 2013-05-30 DIAGNOSIS — R569 Unspecified convulsions: Secondary | ICD-10-CM | POA: Insufficient documentation

## 2013-05-30 DIAGNOSIS — I639 Cerebral infarction, unspecified: Secondary | ICD-10-CM

## 2013-05-30 DIAGNOSIS — Z79899 Other long term (current) drug therapy: Secondary | ICD-10-CM | POA: Insufficient documentation

## 2013-05-30 DIAGNOSIS — I10 Essential (primary) hypertension: Secondary | ICD-10-CM | POA: Diagnosis present

## 2013-05-30 DIAGNOSIS — G8320 Monoplegia of upper limb affecting unspecified side: Secondary | ICD-10-CM | POA: Diagnosis present

## 2013-05-30 DIAGNOSIS — R29810 Facial weakness: Secondary | ICD-10-CM | POA: Diagnosis not present

## 2013-05-30 DIAGNOSIS — D332 Benign neoplasm of brain, unspecified: Secondary | ICD-10-CM

## 2013-05-30 DIAGNOSIS — Z7902 Long term (current) use of antithrombotics/antiplatelets: Secondary | ICD-10-CM | POA: Insufficient documentation

## 2013-05-30 DIAGNOSIS — I69959 Hemiplegia and hemiparesis following unspecified cerebrovascular disease affecting unspecified side: Secondary | ICD-10-CM | POA: Diagnosis not present

## 2013-05-30 DIAGNOSIS — G459 Transient cerebral ischemic attack, unspecified: Secondary | ICD-10-CM

## 2013-05-30 LAB — COMPREHENSIVE METABOLIC PANEL
ALT: 17 U/L (ref 0–35)
AST: 23 U/L (ref 0–37)
Alkaline Phosphatase: 70 U/L (ref 39–117)
BUN: 10 mg/dL (ref 6–23)
CO2: 23 mEq/L (ref 19–32)
GFR calc Af Amer: >90 mL/min (ref >90)
GFR calc non Af Amer: >90 mL/min (ref >90)
Glucose, Bld: 94 mg/dL (ref 70–99)
Potassium: 4 mEq/L (ref 3.5–5.1)
Total Protein: 7 g/dL (ref 6.0–8.3)

## 2013-05-30 LAB — CBC
Hemoglobin: 14.4 g/dL (ref 12.0–15.0)
MCH: 31.1 pg (ref 26.0–34.0)
MCV: 88.6 fL (ref 78.0–100.0)
Platelets: 197 10*3/uL (ref 150–400)
RBC: 4.63 MIL/uL (ref 3.87–5.11)
RDW: 12.8 % (ref 11.5–15.5)
WBC: 6.3 10*3/uL (ref 4.0–10.5)

## 2013-05-30 LAB — DIFFERENTIAL
Eosinophils Absolute: 0.1 10*3/uL (ref 0.0–0.7)
Eosinophils Relative: 2 % (ref 0–5)
Lymphocytes Relative: 47 % — ABNORMAL HIGH (ref 12–46)
Lymphs Abs: 2.9 10*3/uL (ref 0.7–4.0)
Monocytes Relative: 10 % (ref 3–12)
Neutro Abs: 2.6 10*3/uL (ref 1.7–7.7)
Neutrophils Relative %: 42 % — ABNORMAL LOW (ref 43–77)

## 2013-05-30 LAB — POCT I-STAT, CHEM 8
BUN: 10 mg/dL (ref 6–23)
Calcium, Ion: 1.13 mmol/L (ref 1.13–1.30)
Chloride: 102 mEq/L (ref 96–112)
Creatinine, Ser: 0.8 mg/dL (ref 0.50–1.10)
HCT: 42 % (ref 36.0–46.0)
Potassium: 3.6 mEq/L (ref 3.5–5.1)
Sodium: 140 mEq/L (ref 135–145)

## 2013-05-30 LAB — ETHANOL: Alcohol, Ethyl (B): <11 mg/dL (ref 0–11)

## 2013-05-30 LAB — APTT: aPTT: 33 seconds (ref 24–37)

## 2013-05-30 LAB — TROPONIN I: Troponin I: <0.3 ng/mL (ref <0.30)

## 2013-05-30 LAB — GLUCOSE, CAPILLARY: Glucose-Capillary: 88 mg/dL (ref 70–99)

## 2013-05-30 MED ORDER — POLYETHYLENE GLYCOL 3350 17 G PO PACK
17.0000 g | PACK | Freq: Every day | ORAL | Status: DC | PRN
  Filled 2013-05-30: qty 1

## 2013-05-30 MED ORDER — ATENOLOL 50 MG PO TABS
50.0000 mg | ORAL_TABLET | Freq: Every day | ORAL | Status: DC
  Administered 2013-05-31: 50 mg via ORAL
  Filled 2013-05-30: qty 1

## 2013-05-30 MED ORDER — ASPIRIN 81 MG PO CHEW
81.0000 mg | CHEWABLE_TABLET | Freq: Every day | ORAL | Status: DC
  Administered 2013-05-30 – 2013-05-31 (×2): 81 mg via ORAL
  Filled 2013-05-30 (×2): qty 1

## 2013-05-30 MED ORDER — ATENOLOL-CHLORTHALIDONE 50-25 MG PO TABS: 1.0000 | ORAL_TABLET | Freq: Every day | ORAL | Status: DC

## 2013-05-30 MED ORDER — LEVETIRACETAM 500 MG PO TABS
1000.0000 mg | ORAL_TABLET | Freq: Two times a day (BID) | ORAL | Status: DC
  Administered 2013-05-30 – 2013-05-31 (×2): 1000 mg via ORAL
  Filled 2013-05-30 (×3): qty 2

## 2013-05-30 MED ORDER — CLOPIDOGREL BISULFATE 75 MG PO TABS
75.0000 mg | ORAL_TABLET | Freq: Every day | ORAL | Status: DC
  Administered 2013-05-31: 75 mg via ORAL
  Filled 2013-05-30: qty 1

## 2013-05-30 MED ORDER — HYDROCHLOROTHIAZIDE 25 MG PO TABS
25.0000 mg | ORAL_TABLET | Freq: Every day | ORAL | Status: DC
  Administered 2013-05-31: 25 mg via ORAL
  Filled 2013-05-30: qty 1

## 2013-05-30 MED ORDER — ATORVASTATIN CALCIUM 10 MG PO TABS
10.0000 mg | ORAL_TABLET | Freq: Every day | ORAL | Status: DC
  Administered 2013-05-31: 10 mg via ORAL
  Filled 2013-05-30: qty 1

## 2013-05-30 MED ORDER — DIAZEPAM 5 MG PO TABS
5.0000 mg | ORAL_TABLET | Freq: Every day | ORAL | Status: DC
  Administered 2013-05-31: 5 mg via ORAL
  Filled 2013-05-30 (×2): qty 1

## 2013-05-30 MED ORDER — HYDROCODONE-ACETAMINOPHEN 7.5-325 MG PO TABS
1.0000 | ORAL_TABLET | ORAL | Status: DC | PRN
Start: 2013-05-30 — End: 2013-05-31
  Administered 2013-05-30 – 2013-05-31 (×3): 1 via ORAL
  Filled 2013-05-30 (×3): qty 1

## 2013-05-30 MED ORDER — HEPARIN SODIUM (PORCINE) 5000 UNIT/ML IJ SOLN
5000.0000 [IU] | Freq: Three times a day (TID) | INTRAMUSCULAR | Status: DC
  Administered 2013-05-31 (×2): 5000 [IU] via SUBCUTANEOUS
  Filled 2013-05-30 (×4): qty 1

## 2013-05-30 MED ORDER — BACLOFEN 20 MG PO TABS
20.0000 mg | ORAL_TABLET | Freq: Four times a day (QID) | ORAL | Status: DC
  Administered 2013-05-30 – 2013-05-31 (×3): 20 mg via ORAL
  Filled 2013-05-30 (×5): qty 1

## 2013-05-30 MED ORDER — DOCUSATE SODIUM 100 MG PO CAPS
100.0000 mg | ORAL_CAPSULE | Freq: Two times a day (BID) | ORAL | Status: DC
  Administered 2013-05-31 (×2): 100 mg via ORAL
  Filled 2013-05-30 (×3): qty 1

## 2013-05-30 MED ORDER — POTASSIUM CHLORIDE CRYS ER 10 MEQ PO TBCR
10.0000 meq | EXTENDED_RELEASE_TABLET | Freq: Two times a day (BID) | ORAL | Status: DC
  Administered 2013-05-31 (×2): 10 meq via ORAL
  Filled 2013-05-30 (×3): qty 1

## 2013-05-30 MED ORDER — LORAZEPAM 0.5 MG PO TABS: 0.5000 mg | ORAL_TABLET | Freq: Four times a day (QID) | ORAL | Status: DC | PRN

## 2013-05-30 MED ORDER — METHOCARBAMOL 500 MG PO TABS
500.0000 mg | ORAL_TABLET | Freq: Three times a day (TID) | ORAL | Status: DC
  Administered 2013-05-31 (×2): 500 mg via ORAL
  Filled 2013-05-30 (×4): qty 1

## 2013-05-30 NOTE — Consult Note (Addendum)
Referring Physician: Effie Shy    Chief Complaint: Visual changes, left sided numbness/weakness  HPI: Stacy Diaz is an 62 y.o. female with a history of craniotomy for removal of meningioma and stroke with resultant left hemiparesis who reports today an episode of worsening left sided symptoms.  Patient reports that her symptoms began with "skewed" vision.  She then noted numbness in her left arm and face.  She was unable to move her left arm.  She called her husband at that time and EMS was called.  By the time they arrived her symptoms were resolving.  She estimates they lasted about 30 minutes.  She is currently back to baseline.    Date last known well: Date: 05/30/2013 Time last known well: Time: 17:30 tPA Given: No: Resolution of symptoms  Past Medical History  Diagnosis Date  . HTN (hypertension)   . Hyperlipidemia   . Meningioma 1999  . Myocardial infarction 1985?  Marland Kitchen Headache(784.0)     "not real frequent" (08/25/2012)  . Arthritis     "probably" (08/25/2012)  . Stroke 08/25/2012    "that's what they think I've had; sudden LUE weakness" (08/25/2012)  . Spastic paraparesis     S/P meningioma resection 1999/notes 08/25/2012  . Paresis of lower extremity 1999    BLE/notes 08/25/2012  . Anxiety   . Heart disease     Past Surgical History  Procedure Laterality Date  . Coronary angioplasty  1970's  . Cardiac catheterization  1970's  . Brain meningioma excision  1999    Family History  Problem Relation Age of Onset  . Breast cancer Mother   . Heart disease Father    Social History:  reports that she has quit smoking. She has never used smokeless tobacco. She reports that she drinks about 4.2 ounces of alcohol per week. She reports that she does not use illicit drugs.  Allergies: No Known Allergies  Medications: I have reviewed the patient's current medications. Prior to Admission:  Current outpatient prescriptions:atenolol-chlorthalidone (TENORETIC) 50-25 MG per tablet, Take 1  tablet by mouth daily., Disp: 30 tablet, Rfl: 0;  atorvastatin (LIPITOR) 10 MG tablet, Take 1 tablet (10 mg total) by mouth daily., Disp: 100 tablet, Rfl: 3;  baclofen (LIORESAL) 20 MG tablet, Take 1 tablet (20 mg total) by mouth 4 (four) times daily., Disp: 120 each, Rfl: 3 clopidogrel (PLAVIX) 75 MG tablet, Take 1 tablet (75 mg total) by mouth daily with breakfast., Disp: 100 tablet, Rfl: 3;  diazepam (VALIUM) 5 MG tablet, Take 1 tablet (5 mg total) by mouth at bedtime., Disp: 90 tablet, Rfl: 5;  Docusate Sodium (DSS) 100 MG CAPS, Take 100 mg by mouth 2 (two) times daily., Disp: , Rfl:  HYDROcodone-acetaminophen (NORCO) 7.5-325 MG per tablet, Take 1 tablet by mouth every 4 (four) hours as needed for pain., Disp: , Rfl: ;  levETIRAcetam (KEPPRA) 750 MG tablet, Take 1 tablet (750 mg total) by mouth 2 (two) times daily., Disp: 180 tablet, Rfl: 3;  lidocaine (LIDODERM) 5 %, Place 1 patch onto the skin daily as needed (usually twice weekly, only as needed). Remove & Discard patch within 12 hours or as directed by MD, Disp: , Rfl:  LORazepam (ATIVAN) 0.5 MG tablet, Take 1 tablet (0.5 mg total) by mouth every 6 (six) hours as needed for anxiety., Disp: 120 tablet, Rfl: 3;  Melatonin 3 MG TABS, Take 1 tablet (3 mg total) by mouth at bedtime., Disp: 90 tablet, Rfl: 3;  methocarbamol (ROBAXIN) 500 MG tablet, Take  1 tablet (500 mg total) by mouth 3 (three) times daily., Disp: 90 tablet, Rfl: 0 polyethylene glycol (MIRALAX / GLYCOLAX) packet, Take 17 g by mouth daily as needed (for constipation)., Disp: , Rfl: ;  potassium chloride (K-DUR,KLOR-CON) 10 MEQ tablet, Take 1 tablet (10 mEq total) by mouth 2 (two) times daily., Disp: 60 tablet, Rfl: 0;  zinc oxide (BALMEX) 11.3 % CREA cream, Apply 1 application topically 3 (three) times daily., Disp: , Rfl:   ROS: History obtained from the patient  General ROS: negative for - chills, fatigue, fever, night sweats, weight gain or weight loss Psychological ROS: negative for  - behavioral disorder, hallucinations, memory difficulties, mood swings or suicidal ideation Ophthalmic ROS: negative for - blurry vision, double vision, eye pain or loss of vision ENT ROS: negative for - epistaxis, nasal discharge, oral lesions, sore throat, tinnitus or vertigo Allergy and Immunology ROS: negative for - hives or itchy/watery eyes Hematological and Lymphatic ROS: negative for - bleeding problems, bruising or swollen lymph nodes Endocrine ROS: negative for - galactorrhea, hair pattern changes, polydipsia/polyuria or temperature intolerance Respiratory ROS: negative for - cough, hemoptysis, shortness of breath or wheezing Cardiovascular ROS: negative for - chest pain, dyspnea on exertion, edema or irregular heartbeat Gastrointestinal ROS: negative for - abdominal pain, diarrhea, hematemesis, nausea/vomiting or stool incontinence Genito-Urinary ROS: negative for - dysuria, hematuria, incontinence or urinary frequency/urgency Musculoskeletal ROS: negative for - joint swelling or muscular weakness Neurological ROS: as noted in HPI Dermatological ROS: negative for rash and skin lesion changes  Physical Examination: Blood pressure 134/77, temperature 98.7 F (37.1 C), temperature source Oral, resp. rate 15, SpO2 98.00%.  Neurologic Examination: Mental Status: Alert, oriented, thought content appropriate.  Speech fluent without evidence of aphasia.  Able to follow 3 step commands without difficulty. Cranial Nerves: II: Discs flat bilaterally; Visual fields grossly normal, pupils equal, round, reactive to light and accommodation III,IV, VI: ptosis not present, extra-ocular motions intact bilaterally V,VII: left facial droop, facial light touch sensation normal bilaterally VIII: hearing normal bilaterally IX,X: gag reflex present XI: bilateral shoulder shrug XII: midline tongue extension Motor: Right : Upper extremity   5/5     Left:     Upper extremity   4/5 with give-way  weakness on exam.  No drift  Lower extremity   2-3/5     Lower extremity   2/5 Tone and bulk:increased tone in the lower extremities Sensory: Pinprick and light touch intact throughout, bilaterally Deep Tendon Reflexes: 2+ in the upper extremities and absent in the lower extremities Plantars: Right: upgoing   Left: upgoing Cerebellar: normal finger-to-nose Gait: Unable to test CV: pulses palpable throughout   Laboratory Studies:  Basic Metabolic Panel:  Recent Labs Lab 05/30/13 1919 05/30/13 2022  NA 140 140  K 4.0 3.6  CL 104 102  CO2 23  --   GLUCOSE 94 92  BUN 10 10  CREATININE 0.49* 0.80  CALCIUM 9.0  --     Liver Function Tests:  Recent Labs Lab 05/30/13 1919  AST 23  ALT 17  ALKPHOS 70  BILITOT 0.3  PROT 7.0  ALBUMIN 3.8   No results found for this basename: LIPASE, AMYLASE,  in the last 168 hours No results found for this basename: AMMONIA,  in the last 168 hours  CBC:  Recent Labs Lab 05/30/13 1919 05/30/13 2022  WBC 6.3  --   NEUTROABS 2.6  --   HGB 14.4 14.3  HCT 41.0 42.0  MCV 88.6  --  PLT 197  --     Cardiac Enzymes:  Recent Labs Lab 05/30/13 1919  TROPONINI <0.30    BNP: No components found with this basename: POCBNP,   CBG:  Recent Labs Lab 05/30/13 1937  GLUCAP 88    Microbiology: Results for orders placed during the hospital encounter of 09/02/12  URINE CULTURE     Status: None   Collection Time    09/02/12  7:55 PM      Result Value Range Status   Specimen Description URINE, CLEAN CATCH   Final   Special Requests NONE   Final   Culture  Setup Time 09/02/2012 21:14   Final   Colony Count >=100,000 COLONIES/ML   Final   Culture ENTEROCOCCUS SPECIES   Final   Report Status 09/05/2012 FINAL   Final   Organism ID, Bacteria ENTEROCOCCUS SPECIES   Final  URINE CULTURE     Status: None   Collection Time    09/02/12 10:12 PM      Result Value Range Status   Specimen Description URINE, CATHETERIZED   Final    Special Requests NONE   Final   Culture  Setup Time 09/02/2012 22:46   Final   Colony Count 95,000 COLONIES/ML   Final   Culture ENTEROCOCCUS SPECIES   Final   Report Status 09/05/2012 FINAL   Final   Organism ID, Bacteria ENTEROCOCCUS SPECIES   Final  MRSA PCR SCREENING     Status: None   Collection Time    09/03/12  3:16 PM      Result Value Range Status   MRSA by PCR NEGATIVE  NEGATIVE Final   Comment:            The GeneXpert MRSA Assay (FDA     approved for NASAL specimens     only), is one component of a     comprehensive MRSA colonization     surveillance program. It is not     intended to diagnose MRSA     infection nor to guide or     monitor treatment for     MRSA infections.    Coagulation Studies:  Recent Labs  05/30/13 1919  LABPROT 13.6  INR 1.06    Urinalysis: No results found for this basename: COLORURINE, APPERANCEUR, LABSPEC, PHURINE, GLUCOSEU, HGBUR, BILIRUBINUR, KETONESUR, PROTEINUR, UROBILINOGEN, NITRITE, LEUKOCYTESUR,  in the last 168 hours  Lipid Panel:    Component Value Date/Time   CHOL 138 09/03/2012 0620   TRIG 116 09/03/2012 0620   HDL 65 09/03/2012 0620   CHOLHDL 2.1 09/03/2012 0620   VLDL 23 09/03/2012 0620   LDLCALC 50 09/03/2012 0620    HgbA1C:  Lab Results  Component Value Date   HGBA1C 5.6 08/26/2012    Urine Drug Screen:     Component Value Date/Time   LABOPIA POSITIVE* 08/25/2012 1909   COCAINSCRNUR NONE DETECTED 08/25/2012 1909   LABBENZ POSITIVE* 08/25/2012 1909   AMPHETMU NONE DETECTED 08/25/2012 1909   THCU NONE DETECTED 08/25/2012 1909   LABBARB NONE DETECTED 08/25/2012 1909    Alcohol Level:  Recent Labs Lab 05/30/13 1919  ETH <11    Other results: EKG: sinus rhythm at 77 bpm.  Imaging: Ct Head Wo Contrast  05/30/2013   CLINICAL DATA:  TIA. History brain tumor removal 15 years ago  EXAM: CT HEAD WITHOUT CONTRAST  TECHNIQUE: Contiguous axial images were obtained from the base of the skull through the vertex without  intravenous contrast.  COMPARISON:  CT  09/02/2012  FINDINGS: Extensive bilateral craniectomy over the convexity with cranioplasty unchanged from the prior study. Multiple metal clips are seen along the posterior falx, unchanged.  Ventricle size is normal. Chronic infarct right head of caudate. This is unchanged. Encephalomalacia in the frontal parietal lobes over the convexity bilaterally are and change related to prior surgery.  Negative for acute infarct. Negative for hemorrhage or mass lesion.  IMPRESSION: Postop tumor resection over the convexity, unchanged in appearance.  No acute abnormality and no interval change.   Electronically Signed   By: Marlan Palau M.D.   On: 05/30/2013 20:05    Assessment: 62 y.o. female presenting with an episode of left sided hemiparesis and visual changes.  CT of the head has been reviewed and shows no acute changes.  Patient has a history of seizures.  They have not been stereotypical in presentation therefore can not rule out seizure.  With her history of stroke can not rule out an acute ischemic event as well.  Patient is unable to have an MRI  Stroke Risk Factors - hyperlipidemia and hypertension  Plan: 1. HgbA1c, fasting lipid panel 2. PT consult, OT consult, Speech consult 3. Echocardiogram 4. Carotid dopplers 5. Prophylactic therapy-Add ASA 81mg  to Plavix for short term prophylactic therapy 6. EEG 7. Telemetry monitoring 8. Frequent neuro checks 9. Increase Keppra to 1000mg  BID  Thana Farr, MD [facility] [phone] 05/30/2013, 9:37 PM

## 2013-05-30 NOTE — ED Provider Notes (Signed)
CSN: [id number]     Arrival date & time 05/30/13  1843 History   First MD Initiated Contact with Patient 05/30/13 1845     Chief Complaint  Patient presents with  . Transient Ischemic Attack   (Consider location/radiation/quality/duration/timing/severity/associated sxs/prior Treatment) HPI Comments: Stacy Diaz is a 62 y.o. Female here for evaluation of weakness and numbness in left arm and left leg. She also had a left facial droop. Symptoms occurred this afternoon and lasted 10 minutes. When seen on arrival in the emergency department, her symptoms were 100% resolved. 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 She's taking her usual medications. She has not had any neurologic symptoms in 10 months. There's been no recent fever, chills, nausea, vomiting, cough, shortness of breath, or chest pain. She requires maximal support at home, which her husband is able to give to her. He helps her with all transfers. She is typically able to bear weight with him helping, during transfer procedures. There are no other known modifying factors.   The history is provided by the patient.    Past Medical History  Diagnosis Date  . HTN (hypertension)   . Hyperlipidemia   . Meningioma 1999  . Myocardial infarction 1985?  Marland Kitchen Headache(784.0)     "not real frequent" (08/25/2012)  . Arthritis     "probably" (08/25/2012)  . Stroke 08/25/2012    "that's what they think I've had; sudden LUE weakness" (08/25/2012)  . Spastic paraparesis     S/P meningioma resection 1999/notes 08/25/2012  . Paresis of lower extremity 1999    BLE/notes 08/25/2012  . Anxiety   . Heart disease    Past Surgical History  Procedure Laterality Date  . Coronary angioplasty  1970's  . Cardiac catheterization  1970's  . Brain meningioma excision  1999   Family History  Problem Relation Age of Onset  . Breast cancer Mother   . Heart disease Father    History  Substance Use Topics  . Smoking  status: Former Smoker -- 2.00 packs/day for 10 years  . Smokeless tobacco: Never Used     Comment: 08/25/2012 "quit smoking cigarettes in 1985 when I had my heart attack"  . Alcohol Use: 4.2 oz/week    7 Glasses of wine per week     Comment: 08/25/2012 "glass of wine qd"   OB History   Grav Para Term Preterm Abortions TAB SAB Ect Mult Living                 Review of Systems  All other systems reviewed and are negative.    Allergies  Review of patient's allergies indicates no known allergies.  Home Medications   Current Outpatient Rx  Name  Route  Sig  Dispense  Refill  . atenolol-chlorthalidone (TENORETIC) 50-25 MG per tablet   Oral   Take 1 tablet by mouth daily.   30 tablet   0   . atorvastatin (LIPITOR) 10 MG tablet   Oral   Take 1 tablet (10 mg total) by mouth daily.   100 tablet   3   . baclofen (LIORESAL) 20 MG tablet   Oral   Take 1 tablet (20 mg total) by mouth 4 (four) times daily.   120 each   3   . clopidogrel (PLAVIX) 75 MG tablet   Oral   Take 1 tablet (75 mg total) by mouth daily with breakfast.   100 tablet   3   .  diazepam (VALIUM) 5 MG tablet   Oral   Take 1 tablet (5 mg total) by mouth at bedtime.   90 tablet   5   . Docusate Sodium (DSS) 100 MG CAPS   Oral   Take 100 mg by mouth 2 (two) times daily.         Marland Kitchen HYDROcodone-acetaminophen (NORCO) 7.5-325 MG per tablet   Oral   Take 1 tablet by mouth every 4 (four) hours as needed for pain.         Marland Kitchen levETIRAcetam (KEPPRA) 750 MG tablet   Oral   Take 1 tablet (750 mg total) by mouth 2 (two) times daily.   180 tablet   3   . lidocaine (LIDODERM) 5 %   Transdermal   Place 1 patch onto the skin daily as needed (usually twice weekly, only as needed). Remove & Discard patch within 12 hours or as directed by MD         . LORazepam (ATIVAN) 0.5 MG tablet   Oral   Take 1 tablet (0.5 mg total) by mouth every 6 (six) hours as needed for anxiety.   120 tablet   3   . Melatonin 3 MG  TABS   Oral   Take 1 tablet (3 mg total) by mouth at bedtime.   90 tablet   3   . methocarbamol (ROBAXIN) 500 MG tablet   Oral   Take 1 tablet (500 mg total) by mouth 3 (three) times daily.   90 tablet   0   . polyethylene glycol (MIRALAX / GLYCOLAX) packet   Oral   Take 17 g by mouth daily as needed (for constipation).         . potassium chloride (K-DUR,KLOR-CON) 10 MEQ tablet   Oral   Take 1 tablet (10 mEq total) by mouth 2 (two) times daily.   60 tablet   0   . zinc oxide (BALMEX) 11.3 % CREA cream   Topical   Apply 1 application topically 3 (three) times daily.          BP 134/77  Temp(Src) 98.7 F (37.1 C) (Oral)  Resp 15  SpO2 98% Physical Exam  Nursing note and vitals reviewed. Constitutional: She is oriented to person, place, and time. She appears well-developed and well-nourished.  HENT:  Head: Normocephalic and atraumatic.  Eyes: Conjunctivae and EOM are normal. Pupils are equal, round, and reactive to light.  Neck: Normal range of motion and phonation normal. Neck supple.  Cardiovascular: Normal rate, regular rhythm and intact distal pulses.   Pulmonary/Chest: Effort normal and breath sounds normal. She exhibits no tenderness.  Abdominal: Soft. She exhibits no distension. There is no tenderness. There is no guarding.  Musculoskeletal: Normal range of motion.  Neurological: She is alert and oriented to person, place, and time. She exhibits normal muscle tone.  She has somewhat limited recall, but is alert and oriented x3. She has occasional involuntary spasm of the toes bilaterally, but cannot elevate either leg off the stretcher. The arms have normal motion bilaterally and there is no pronator drift. There is no facial asymmetry. No dysarthria, and no aphasia.  Skin: Skin is warm and dry.  Psychiatric: She has a normal mood and affect. Her behavior is normal. Judgment and thought content normal.    ED Course  Procedures (including critical care  time)  Patient Vitals for the past 24 hrs:  BP Temp Temp src Resp SpO2  05/30/13 1945 134/77 mmHg 98.7 F (  37.1 C) Oral 15 98 %  05/30/13 1901 158/93 mmHg 97.4 F (36.3 C) Oral 20 97 %   20:00- discussed with neuro hospitalist, Dr. Thad Ranger, she will see the patient.  2133- Dr. Thad Ranger would like to have the patient admitted to a medical hospitalist and she will follow along as a consult  9:34 PM-Consult complete with Dr. Julian Reil Patient case explained and discussed. He agrees to admit patient for further evaluation and treatment. Call ended at 2145   Date: 05/30/13  Rate: 77  Rhythm: normal sinus rhythm and premature ventricular contractions (PVC)  QRS Axis: normal  PR and QT Intervals: normal  ST/T Wave abnormalities: normal  PR and QRS Conduction Disutrbances:none  Narrative Interpretation:   Old EKG Reviewed: changes noted- PVC new since, 09/02/12   Labs Review Labs Reviewed  DIFFERENTIAL - Abnormal; Notable for the following:    Neutrophils Relative % 42 (*)    Lymphocytes Relative 47 (*)    All other components within normal limits  COMPREHENSIVE METABOLIC PANEL - Abnormal; Notable for the following:    Creatinine, Ser 0.49 (*)    All other components within normal limits  ETHANOL  PROTIME-INR  APTT  CBC  TROPONIN I  GLUCOSE, CAPILLARY  URINE RAPID DRUG SCREEN (HOSP PERFORMED)  URINALYSIS, ROUTINE W REFLEX MICROSCOPIC  POCT I-STAT, CHEM 8   Imaging Review Ct Head Wo Contrast  05/30/2013   CLINICAL DATA:  TIA. History brain tumor removal 15 years ago  EXAM: CT HEAD WITHOUT CONTRAST  TECHNIQUE: Contiguous axial images were obtained from the base of the skull through the vertex without intravenous contrast.  COMPARISON:  CT 09/02/2012  FINDINGS: Extensive bilateral craniectomy over the convexity with cranioplasty unchanged from the prior study. Multiple metal clips are seen along the posterior falx, unchanged.  Ventricle size is normal. Chronic infarct right head of  caudate. This is unchanged. Encephalomalacia in the frontal parietal lobes over the convexity bilaterally are and change related to prior surgery.  Negative for acute infarct. Negative for hemorrhage or mass lesion.  IMPRESSION: Postop tumor resection over the convexity, unchanged in appearance.  No acute abnormality and no interval change.   Electronically Signed   By: Marlan Palau M.D.   On: 05/30/2013 20:05    EKG Interpretation   None       MDM   1. TIA (transient ischemic attack)       Evaluation consistent with TIA Patient is medically complex. She will require admission for further evaluation and treatment.  Nursing Notes Reviewed/ Care Coordinated Applicable Imaging Reviewed Interpretation of Laboratory Data incorporated into ED treatment  Plan- Admit     Flint Melter, MD 05/30/13 2147

## 2013-05-30 NOTE — ED Notes (Signed)
Attempt to call report. Nurse states will call back

## 2013-05-30 NOTE — ED Notes (Signed)
Per EMS: pt had CVA in January and doing rehab today. Pt had left sided numbness, weakness in left arm and left leg. Pt has left sided facial droop from previous stroke. 10 minutes total from start of symptoms to resolution. Hx of TIA prior to stroke but has not had one since. No pain. BP 145/80 CBG 86 HR 70 irregular.

## 2013-05-30 NOTE — Progress Notes (Signed)
Attempted to call back ED nurse at [phone], was told nurse would call back. Left a message with my name and call back number. [place], Jaccob Czaplicki [phone]

## 2013-05-30 NOTE — H&P (Signed)
[facility] History and Physical  [name]:[id number] DOB: 1950/12/22 DOA: 05/30/2013  Referring physician: ED PCP: Evette Georges, MD  Chief Complaint: L facial droop, LUE weakness  HPI: Stacy Diaz is a 62 y.o. female with a complex neurologic history including craniotomy for meningioma with resulting stroke.  The patient presents to the ED after a 30 min episode of L sided facial droop and LUE weakness that occurred earlier today.  EMS was called but by the time they arrived her symptoms were resolving and she is currently back to baseline.  The patient has history of similar presentation in January, believed to be due to new CVA at that time vs complex partial seizures.  Symptoms ultimately resolved but took months to do so.  Repeated CT scans of her head show no change to the infarct since that time.  Review of Systems: 12 systems reviewed and otherwise negative.  Past Medical History  Diagnosis Date  . HTN (hypertension)   . Hyperlipidemia   . Meningioma 1999  . Myocardial infarction 1985?  Marland Kitchen Headache(784.0)     "not real frequent" (08/25/2012)  . Arthritis     "probably" (08/25/2012)  . Stroke 08/25/2012    "that's what they think I've had; sudden LUE weakness" (08/25/2012)  . Spastic paraparesis     S/P meningioma resection 1999/notes 08/25/2012  . Paresis of lower extremity 1999    BLE/notes 08/25/2012  . Anxiety   . Heart disease    Past Surgical History  Procedure Laterality Date  . Coronary angioplasty  1970's  . Cardiac catheterization  1970's  . Brain meningioma excision  1999   Social History:  reports that she has quit smoking. She has never used smokeless tobacco. She reports that she drinks about 4.2 ounces of alcohol per week. She reports that she does not use illicit drugs.  No Known Allergies  Family History  Problem Relation Age of Onset  . Breast cancer Mother   . Heart disease Father     Prior to Admission medications    Medication Sig Start Date End Date Taking? Authorizing Provider  atenolol-chlorthalidone (TENORETIC) 50-25 MG per tablet Take 1 tablet by mouth daily. 11/13/12  Yes Lucrezia Starch, NP  atorvastatin (LIPITOR) 10 MG tablet Take 1 tablet (10 mg total) by mouth daily. 12/13/12  Yes Roderick Pee, MD  baclofen (LIORESAL) 20 MG tablet Take 1 tablet (20 mg total) by mouth 4 (four) times daily. 12/16/12  Yes Roderick Pee, MD  clopidogrel (PLAVIX) 75 MG tablet Take 1 tablet (75 mg total) by mouth daily with breakfast. 12/13/12  Yes Roderick Pee, MD  diazepam (VALIUM) 5 MG tablet Take 1 tablet (5 mg total) by mouth at bedtime. 12/07/12  Yes Roderick Pee, MD  Docusate Sodium (DSS) 100 MG CAPS Take 100 mg by mouth 2 (two) times daily. 11/13/12  Yes Lucrezia Starch, NP  HYDROcodone-acetaminophen (NORCO) 7.5-325 MG per tablet Take 1 tablet by mouth every 4 (four) hours as needed for pain.   Yes Historical Provider, MD  levETIRAcetam (KEPPRA) 750 MG tablet Take 1 tablet (750 mg total) by mouth 2 (two) times daily. 11/28/12  Yes Roderick Pee, MD  lidocaine (LIDODERM) 5 % Place 1 patch onto the skin daily as needed (usually twice weekly, only as needed). Remove & Discard patch within 12 hours or as directed by MD   Yes Historical Provider, MD  LORazepam (ATIVAN) 0.5 MG tablet Take 1 tablet (0.5 mg total) by  mouth every 6 (six) hours as needed for anxiety. 03/27/13  Yes Roderick Pee, MD  Melatonin 3 MG TABS Take 1 tablet (3 mg total) by mouth at bedtime. 12/07/12  Yes Roderick Pee, MD  methocarbamol (ROBAXIN) 500 MG tablet Take 1 tablet (500 mg total) by mouth 3 (three) times daily. 12/09/12  Yes Roderick Pee, MD  polyethylene glycol [facility] / GLYCOLAX) packet Take 17 g by mouth daily as needed (for constipation).   Yes Historical Provider, MD  potassium chloride (K-DUR,KLOR-CON) 10 MEQ tablet Take 1 tablet (10 mEq total) by mouth 2 (two) times daily. 11/13/12  Yes Lucrezia Starch, NP  zinc oxide (BALMEX) 11.3 % CREA  cream Apply 1 application topically 3 (three) times daily.   Yes Historical Provider, MD   Physical Exam: Filed Vitals:   05/30/13 2202  BP:   Pulse: 74  Temp:   Resp: 17    General:  NAD, resting comfortably in bed Eyes: PEERLA EOMI ENT: mucous membranes moist Neck: supple w/o JVD Cardiovascular: RRR w/o MRG Respiratory: CTA B Abdomen: soft, nt, nd, bs+ Skin: no rash nor lesion Musculoskeletal: See neuro exam Psychiatric: normal tone and affect Neurologic: AAOx3, BLE 2/5 strength, LUE 4/5 RUE 5/5, remainder of exam non-focal  Labs on Admission:  Basic Metabolic Panel:  Recent Labs Lab 05/30/13 1919 05/30/13 2022  NA 140 140  K 4.0 3.6  CL 104 102  CO2 23  --   GLUCOSE 94 92  BUN 10 10  CREATININE 0.49* 0.80  CALCIUM 9.0  --    Liver Function Tests:  Recent Labs Lab 05/30/13 1919  AST 23  ALT 17  ALKPHOS 70  BILITOT 0.3  PROT 7.0  ALBUMIN 3.8   No results found for this basename: LIPASE, AMYLASE,  in the last 168 hours No results found for this basename: AMMONIA,  in the last 168 hours CBC:  Recent Labs Lab 05/30/13 1919 05/30/13 2022  WBC 6.3  --   NEUTROABS 2.6  --   HGB 14.4 14.3  HCT 41.0 42.0  MCV 88.6  --   PLT 197  --    Cardiac Enzymes:  Recent Labs Lab 05/30/13 1919  TROPONINI <0.30    BNP (last 3 results) No results found for this basename: PROBNP,  in the last 8760 hours CBG:  Recent Labs Lab 05/30/13 1937  GLUCAP 88    Radiological Exams on Admission: Ct Head Wo Contrast  05/30/2013   CLINICAL DATA:  TIA. History brain tumor removal 15 years ago  EXAM: CT HEAD WITHOUT CONTRAST  TECHNIQUE: Contiguous axial images were obtained from the base of the skull through the vertex without intravenous contrast.  COMPARISON:  CT 09/02/2012  FINDINGS: Extensive bilateral craniectomy over the convexity with cranioplasty unchanged from the prior study. Multiple metal clips are seen along the posterior falx, unchanged.  Ventricle size  is normal. Chronic infarct right head of caudate. This is unchanged. Encephalomalacia in the frontal parietal lobes over the convexity bilaterally are and change related to prior surgery.  Negative for acute infarct. Negative for hemorrhage or mass lesion.  IMPRESSION: Postop tumor resection over the convexity, unchanged in appearance.  No acute abnormality and no interval change.   Electronically Signed   By: Marlan Palau M.D.   On: 05/30/2013 20:05    EKG: Independently reviewed.  Assessment/Plan Active Problems:   Paralysis of upper limb   Seizures   Facial droop   1. Transient Facial droop and LUE  weakness - TIA vs Seizure - orders as per neurology consult put in the computer.  Given the history of nearly identical symptoms multiple times in the past sometimes lasting months at a time with no changes on CT scan, do feel that Seizure may be more likely, however neither can be ruled out at this point.    Code Status: Full (must indicate code status--if unknown or must be presumed, indicate so) Family Communication: Spoke with husband at bedside (indicate person spoken with, if applicable, with phone number if by telephone) Disposition Plan: Admit to obs (indicate anticipated LOS)  Time spent: 70 min  Gaynor Genco M. [facility] Pager [phone]  If 7PM-7AM, please contact night-coverage [email or web address] Password [email or web address] 05/30/2013, 10:13 PM

## 2013-05-30 NOTE — ED Notes (Signed)
Patient transported to CT and return  

## 2013-05-31 ENCOUNTER — Observation Stay (HOSPITAL_COMMUNITY): Payer: Medicare Other

## 2013-05-31 DIAGNOSIS — I10 Essential (primary) hypertension: Secondary | ICD-10-CM

## 2013-05-31 DIAGNOSIS — D332 Benign neoplasm of brain, unspecified: Secondary | ICD-10-CM

## 2013-05-31 DIAGNOSIS — I6350 Cerebral infarction due to unspecified occlusion or stenosis of unspecified cerebral artery: Secondary | ICD-10-CM

## 2013-05-31 DIAGNOSIS — R29810 Facial weakness: Secondary | ICD-10-CM

## 2013-05-31 DIAGNOSIS — R569 Unspecified convulsions: Secondary | ICD-10-CM

## 2013-05-31 LAB — LIPID PANEL
Cholesterol: 191 mg/dL (ref 0–200)
LDL Cholesterol: 108 mg/dL — ABNORMAL HIGH (ref 0–99)
Total CHOL/HDL Ratio: 3.1 RATIO
Triglycerides: 107 mg/dL (ref <150)
VLDL: 21 mg/dL (ref 0–40)

## 2013-05-31 LAB — RAPID URINE DRUG SCREEN, HOSP PERFORMED
Barbiturates: NOT DETECTED
Benzodiazepines: NOT DETECTED
Opiates: POSITIVE — AB

## 2013-05-31 MED ORDER — LEVETIRACETAM 1000 MG PO TABS: 1000.0000 mg | ORAL_TABLET | Freq: Two times a day (BID) | ORAL | Status: DC

## 2013-05-31 MED ORDER — WHITE PETROLATUM GEL
Status: AC
  Filled 2013-05-31: qty 5

## 2013-05-31 NOTE — Procedures (Signed)
ELECTROENCEPHALOGRAM REPORT   Patient: Stacy Diaz       Room #:  EEG No. ID: [id number] Age: 62 y.o.        Sex: female Referring Physician: Julian Reil Report Date:  05/31/2013        Interpreting Physician: Aline Brochure  History: Stacy Diaz is an 62 y.o. female with a history of craniotomy for resection of meningioma, and stroke with residual left hemiparesis. Patient presented with transient visual changes with associated weakness and numbness involving left arm and face.  Indications for study:  Rule out focal seizure disorder.  Technique: This is an 18 channel routine scalp EEG performed at the bedside with bipolar and monopolar montages arranged in accordance to the international 10/20 system of electrode placement.   Description: This EEG recording was performed during wakefulness. Background activity consisted of 10 Hz symmetrical alpha rhythm recorder from posterior head regions as well as low amplitude 20-25 Hz beta activity reported from the frontal and central regions. Background activity was symmetrical. Photic stimulation was not performed. Hyperventilation was not performed. No epileptiform discharges were recorded. There was no abnormal slowing.  Interpretation: This is a normal EEG recording during wakefulness. No evidence of an epileptic disorder is demonstrated.   Venetia Maxon M.D. [facility] [phone]

## 2013-05-31 NOTE — Progress Notes (Signed)
Occupational Therapy Treatment Patient Details Name: RAYLYN CARTON MRN: [id number] DOB: 04-Jan-1951 Today's Date: 05/31/2013 Time: 4098-1191 OT Time Calculation (min): 24 min  OT Assessment / Plan / Recommendation  History of present illness  62 y.o. female with a complex neurologic history including craniotomy for meningioma with resulting stroke. The patient presents to the ED after a 30 min episode of L sided facial droop and LUE weakness that occurred earlier today. EMS was called but by the time they arrived her symptoms were resolving and she is currently back to baseline. CT negative for workup. Pt at baseline is wheelchair bound and incontinent.   OT comments  Patient provided treatment by Occupational Therapy with no further acute OT needs identified. All education has been completed and the patient has no further questions. See below for any follow-up Occupational Therapy or equipment needs. OT to sign off. Thank you for referral.    Follow Up Recommendations  Outpatient OT;Supervision/Assistance - 24 hour    Barriers to Discharge       Equipment Recommendations  None recommended by OT    Recommendations for Other Services    Frequency Min 2X/week   Progress towards OT Goals Progress towards OT goals: Goals met/education completed, patient discharged from OT  Plan Discharge plan remains appropriate    Precautions / Restrictions Precautions Precautions: Fall Required Braces or Orthoses: Other Brace/Splint Other Brace/Splint: Bil LE knee braces, BIL AFO, uses a  lazy susan disc to pivot to chair with husband   Pertinent Vitals/Pain none    ADL  ADL Comments: Pt and husband don braces and completed stretching at EOB. Pt completed sit<>Stand with husband using lazy susan. Pt positioned in chair. pt educated on follow up at outpatient. Pt and husband without further questions.    OT Diagnosis:    OT Problem List:   OT Treatment Interventions:     OT Goals(current  goals can now be found in the care plan section) Acute Rehab OT Goals Patient Stated Goal: to return to outpatient therapy OT Goal Formulation: With patient Time For Goal Achievement: 06/14/13 Potential to Achieve Goals: Good ADL Goals Pt Will Transfer to Toilet: with mod assist;stand pivot transfer Additional ADL Goal #1: Pt will complete basic transfer with husband MOD I    Visit Information  Last OT Received On: 05/31/13 Assistance Needed: +1 History of Present Illness:  62 y.o. female with a complex neurologic history including craniotomy for meningioma with resulting stroke. The patient presents to the ED after a 30 min episode of L sided facial droop and LUE weakness that occurred earlier today. EMS was called but by the time they arrived her symptoms were resolving and she is currently back to baseline. CT negative for workup. Pt at baseline is wheelchair bound and incontinent.    Subjective Data      Prior Functioning  Home Living Family/patient expects to be discharged to:: Private residence Living Arrangements: Spouse/significant other Available Help at Discharge: Family;Available 24 hours/day Type of Home: House Home Access: Other (comment) (lift to help get w/c in house) Home Layout: One level Home Equipment: Electric scooter;Grab bars - toilet;Grab bars - tub/shower;Hand held shower head;Walker - 2 wheels Additional Comments: stands inside RW after void of bowel for peri hygiene Prior Function Level of Independence: Needs assistance Gait / Transfers Assistance Needed: unable to ambulate but completes stand pivots with lazy susan ADL's / Homemaking Assistance Needed: Pt has (A) from husband for bathing, dressing, and transfers.  Comments: car has  modifications to allow transfer from ground level using lazy susan pivot into seat and then seat lifts back into the car Communication Communication: No difficulties Dominant Hand: Right    Cognition   Cognition Arousal/Alertness: Awake/alert Behavior During Therapy: WFL for tasks assessed/performed Overall Cognitive Status: Within Functional Limits for tasks assessed    Mobility  Bed Mobility Bed Mobility: Rolling Right;Rolling Left Rolling Right: 2: Max assist;With rail Rolling Left: 2: Max assist;With rail Details for Bed Mobility Assistance: Pt with bil LE knee extension and needs (A) to roll like a log. Pt using BIL UE on bed rail causing segmental roll. Transfers Transfers: Sit to Stand;Stand to Sit Sit to Stand: 2: Max assist (mod I with caregiver providing max assist) Stand to Sit: 2: Max assist (mod I with caregiver providing max assist)    Exercises      Balance     End of Session OT - End of Session Activity Tolerance: Patient tolerated treatment well Patient left: Other (comment) (in w/c )  GO Functional Assessment Tool Used: clinical judgement Functional Limitation: Self care   Harolyn Rutherford 05/31/2013, 6:02 PM Pager: [phone]

## 2013-05-31 NOTE — Progress Notes (Signed)
Stroke Team Progress Note  HISTORY Stacy Diaz is an 62 y.o. female with a history of craniotomy for removal of meningioma and stroke with resultant left hemiparesis who reports today 05/30/2013 an episode of worsening left sided symptoms. Patient reports that her symptoms began at 1730 with "skewed" vision. She then noted numbness in her left arm and face. She was unable to move her left arm. She called her husband at that time and EMS was called. By the time they arrived her symptoms were resolving. She estimates they lasted about 30 minutes. She is currently back to baseline. Patient was not a TPA candidate secondary to  Resolution of symptoms. She was admitted for further evaluation and treatment.  SUBJECTIVE No family is at the bedside.  Overall she feels her condition is gradually improving. She has had no further episodes since her admission in Jan where she was felt to have a seizure. Pt reports she has metal plates in her head from crani done by Dr. Newell Coral ~ 15 yrs ago.  OBJECTIVE Most recent Vital Signs: Filed Vitals:   05/31/13 0123 05/31/13 0330 05/31/13 0530 05/31/13 0734  BP: 106/58 137/74 119/63 138/74  Pulse: 69 72 68 78  Temp: 97.9 F (36.6 C) 98.2 F (36.8 C) 97.6 F (36.4 C) 97.7 F (36.5 C)  TempSrc:    Oral  Resp: 18 18 18 18   Height:      Weight:      SpO2: 97% 100% 100% 100%   CBG (last 3)   Recent Labs  05/30/13 1937  GLUCAP 88    IV Fluid Intake:     MEDICATIONS  . aspirin  81 mg Oral Daily  . atenolol  50 mg Oral Daily   And  . hydrochlorothiazide  25 mg Oral Daily  . atorvastatin  10 mg Oral Daily  . baclofen  20 mg Oral QID  . clopidogrel  75 mg Oral Q breakfast  . diazepam  5 mg Oral QHS  . docusate sodium  100 mg Oral BID  . heparin  5,000 Units Subcutaneous Q8H  . levETIRAcetam  1,000 mg Oral BID  . methocarbamol  500 mg Oral TID  . potassium chloride  10 mEq Oral BID  . white petrolatum       PRN:  HYDROcodone-acetaminophen,  LORazepam, polyethylene glycol  Diet:  Cardiac thin liquids Activity:   DVT Prophylaxis:  Heparin 5000 units sq tid   CLINICALLY SIGNIFICANT STUDIES Basic Metabolic Panel:  Recent Labs Lab 05/30/13 1919 05/30/13 2022  NA 140 140  K 4.0 3.6  CL 104 102  CO2 23  --   GLUCOSE 94 92  BUN 10 10  CREATININE 0.49* 0.80  CALCIUM 9.0  --    Liver Function Tests:  Recent Labs Lab 05/30/13 1919  AST 23  ALT 17  ALKPHOS 70  BILITOT 0.3  PROT 7.0  ALBUMIN 3.8   CBC:  Recent Labs Lab 05/30/13 1919 05/30/13 2022  WBC 6.3  --   NEUTROABS 2.6  --   HGB 14.4 14.3  HCT 41.0 42.0  MCV 88.6  --   PLT 197  --    Coagulation:  Recent Labs Lab 05/30/13 1919  LABPROT 13.6  INR 1.06   Cardiac Enzymes:  Recent Labs Lab 05/30/13 1919  TROPONINI <0.30   Urinalysis: No results found for this basename: COLORURINE, APPERANCEUR, LABSPEC, PHURINE, GLUCOSEU, HGBUR, BILIRUBINUR, KETONESUR, PROTEINUR, UROBILINOGEN, NITRITE, LEUKOCYTESUR,  in the last 168 hours Lipid Panel  Component Value Date/Time   CHOL 191 05/31/2013 0530   TRIG 107 05/31/2013 0530   HDL 62 05/31/2013 0530   CHOLHDL 3.1 05/31/2013 0530   VLDL 21 05/31/2013 0530   LDLCALC 108* 05/31/2013 0530   HgbA1C  Lab Results  Component Value Date   HGBA1C 5.6 08/26/2012    Urine Drug Screen:     Component Value Date/Time   LABOPIA POSITIVE* 05/30/2013 2308   COCAINSCRNUR NONE DETECTED 05/30/2013 2308   LABBENZ NONE DETECTED 05/30/2013 2308   AMPHETMU NONE DETECTED 05/30/2013 2308   THCU NONE DETECTED 05/30/2013 2308   LABBARB NONE DETECTED 05/30/2013 2308    Alcohol Level:  Recent Labs Lab 05/30/13 1919  ETH <11    CT of the brain  05/30/2013   Postop tumor resection over the convexity, unchanged in appearance.  No acute abnormality and no interval change.   CXR  05/30/2013    No active cardiopulmonary disease.    Therapy Recommendations no therapy needs  Physical Exam   Pleasant middle-aged  Caucasian lady currently not in distress.Awake alert. Afebrile. Head is nontraumatic. Neck is supple without bruit. Hearing is normal. Cardiac exam no murmur or gallop. Lungs are clear to auscultation. Distal pulses are well felt. Neurological Exam : Awake  Alert oriented x 3. Normal speech and language.eye movements full without nystagmus.fundi were not visualized. Vision acuity and fields appear normal. Hearing is normal. Palatal movements are normal. Face symmetric. Tongue midline. Normal strength, tone, reflexes and coordination. Normal sensation. Gait deferred. ASSESSMENT [name] is a 62 y.o. female presenting with left sided weakness, numbness - similar to episodes in Jan. Dx: seizure vs migraine; stroke/TIA not suspected. On clopidogrel 75 mg orally every day prior to admission. Now on aspirin 81 mg orally every day and clopidogrel 75 mg orally every day for secondary stroke prevention. Patient with resultant left sided deficits that have resolved. Work up underway.   Crani for meningioma removal 15 yrs ago by Dr. Newell Coral Hypertension Hyperlipidemia, LDL 108, on lipitor 10 PTA, now on lipitor 10 , goal LDL < 100 (< 70 for diabetics) Jan 2014 thought to have a sz d/t recurrent left arm numbness and weakness - unable to rule out stroke with MRI due to "metal in her skull" - had been on seizure med x 14 yr prior which she had recently stopped. She had follow up with Dr. Pearlean Brownie in his [facility] day # 1  TREATMENT/PLAN  Continue clopidogrel 75 mg orally every day for secondary stroke prevention. Discontinue newly added aspirin.  Home dose of keppra increased from 750 mg bid to 1000 mg bid  F/u EEG  No additional stroke workup indicated - carotid doppler and 2 D echo discontinued  Ok for discharge from neuro standpoint once EEG completed  Annie Main, MSN, RN, ANVP-BC, ANP-BC, GNP-BC [facility] Pager: [phone] 05/31/2013 10:03 AM  I have  personally obtained a history, examined the patient, evaluated imaging results, and formulated the assessment and plan of care. I agree with the above. Delia Heady, MD

## 2013-05-31 NOTE — Evaluation (Signed)
Physical Therapy Evaluation Patient Details Name: Stacy Diaz MRN: [id number] DOB: 1950-12-11 Today's Date: 05/31/2013 Time: 4098-1191 PT Time Calculation (min): 28 min  PT Assessment / Plan / Recommendation History of Present Illness   62 y.o. female with a complex neurologic history including craniotomy for meningioma with resulting stroke. The patient presents to the ED after a 30 min episode of L sided facial droop and LUE weakness that occurred earlier today. EMS was called but by the time they arrived her symptoms were resolving and she is currently back to baseline. CT negative for workup. Pt at baseline is wheelchair bound and incontinent.  Clinical Impression  Patient with good recovery of function, patient and spouse able to demostrate home management for mobility without new incident. Recommend patient d/c from acute Pt and return to outpatient PT for follow-up.    PT Assessment  All further PT needs can be met in the next venue of care    Follow Up Recommendations  Outpatient PT          Equipment Recommendations  None recommended by PT    Recommendations for Other Services  (paraplegia)   Frequency      Precautions / Restrictions Precautions Precautions: Fall Required Braces or Orthoses: Other Brace/Splint Other Brace/Splint: Bil LE knee braces, BIL AFO, uses a  lazy susan disc to pivot to chair with husband   Pertinent Vitals/Pain No pain at this time      Mobility  Bed Mobility Bed Mobility: Rolling Right;Rolling Left Rolling Right: 2: Max assist;With rail Rolling Left: 2: Max assist;With rail Details for Bed Mobility Assistance: Pt with bil LE knee extension and needs (A) to roll like a log. Pt using BIL UE on bed rail causing segmental roll. Transfers Transfers: Sit to Stand;Stand to [employer] Transfers Sit to Stand: 2: Max assist (mod I with caregiver providing max assist) Stand to Sit: 2: Max assist (mod I with caregiver providing max  assist) Stand Pivot Transfers: 2: Max assist (mod I with caregiver providing max assist, using lazy susan) Ambulation/Gait Ambulation/Gait Assistance: Not tested (comment) Dealer chair)     PT Problem List: Decreased strength;Decreased range of motion;Decreased activity tolerance;Decreased balance;Decreased mobility PT Treatment Interventions:       PT Goals(Current goals can be found in the care plan section) Acute Rehab PT Goals Patient Stated Goal: to return to outpatient therapy  Visit Information  Last PT Received On: 05/31/13 Assistance Needed: +1 (assist from husband) History of Present Illness:  62 y.o. female with a complex neurologic history including craniotomy for meningioma with resulting stroke. The patient presents to the ED after a 30 min episode of L sided facial droop and LUE weakness that occurred earlier today. EMS was called but by the time they arrived her symptoms were resolving and she is currently back to baseline. CT negative for workup. Pt at baseline is wheelchair bound and incontinent.       Prior Functioning  Home Living Family/patient expects to be discharged to:: Private residence Living Arrangements: Spouse/significant other Available Help at Discharge: Family;Available 24 hours/day Type of Home: House Home Access: Other (comment) (lift to help get w/c in house) Home Layout: One level Home Equipment: Electric scooter;Grab bars - toilet;Grab bars - tub/shower;Hand held shower head;Walker - 2 wheels Additional Comments: stands inside RW after void of bowel for peri hygiene Prior Function Level of Independence: Needs assistance Gait / Transfers Assistance Needed: unable to ambulate but completes stand pivots with lazy susan ADL's /  Homemaking Assistance Needed: Pt has (A) from husband for bathing, dressing, and transfers.  Comments: car has modifications to allow transfer from ground level using lazy susan pivot into seat and then  seat lifts back into the car Communication Communication: No difficulties Dominant Hand: Right    Cognition  Cognition Arousal/Alertness: Awake/alert Behavior During Therapy: WFL for tasks assessed/performed Overall Cognitive Status: Within Functional Limits for tasks assessed    Extremity/Trunk Assessment Upper Extremity Assessment Upper Extremity Assessment: Overall WFL for tasks assessed Lower Extremity Assessment Lower Extremity Assessment: RLE deficits/detail;LLE deficits/detail (No active ROM bilaterally) Cervical / Trunk Assessment Cervical / Trunk Assessment: Normal      End of Session PT - End of Session Activity Tolerance: Patient tolerated treatment well Patient left: in chair;with family/visitor present (scooter chair)  GP     Fabio Asa 05/31/2013, 3:16 PM Charlotte Crumb, PT DPT  [phone]

## 2013-05-31 NOTE — Progress Notes (Signed)
EEG Completed; Results Pending  

## 2013-05-31 NOTE — Progress Notes (Signed)
Discharge instructions given. Pt verbalized understanding and all questions were answered.  

## 2013-05-31 NOTE — Evaluation (Signed)
Speech Language Pathology Evaluation Patient Details Name: Stacy Diaz MRN: [id number] DOB: 1950-08-29 Today's Date: 05/31/2013 Time: 1191-4782 SLP Time Calculation (min): 13 min  Problem List:  Patient Active Problem List   Diagnosis Date Noted  . CVA (cerebral infarction) 09/05/2012  . Facial droop 09/02/2012  . Seizures 08/26/2012  . UTI (lower urinary tract infection) 08/26/2012  . 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 Boil of buttock 09/28/2011  . BENIGN NEOPLASM OF BRAIN 03/31/2010  . INJURY, NERVE, PELVIS/LWR LIMB NOS 05/20/2007  . HYPERLIPIDEMIA 05/12/2007  . HYPERTENSION 05/12/2007  . PREMATURE VENTRICULAR CONTRACTIONS 05/12/2007   Past Medical History:  Past Medical History  Diagnosis Date  . HTN (hypertension)   . Hyperlipidemia   . Meningioma 1999  . Myocardial infarction 1985?  Marland Kitchen Headache(784.0)     "not real frequent" (08/25/2012)  . Arthritis     "probably" (08/25/2012)  . Stroke 08/25/2012    "that's what they think I've had; sudden LUE weakness" (08/25/2012)  . Spastic paraparesis     S/P meningioma resection 1999/notes 08/25/2012  . Paresis of lower extremity 1999    BLE/notes 08/25/2012  . Anxiety   . Heart disease    Past Surgical History:  Past Surgical History  Procedure Laterality Date  . Coronary angioplasty  1970's  . Cardiac catheterization  1970's  . Brain meningioma excision  1999   HPI:  62 y.o. female with a complex neurologic history including craniotomy for meningioma with resulting stroke. The patient presents to the ED after a 30 min episode of L sided facial droop and LUE weakness that occurred earlier today.  CT Stable postoperative changes and bihemisphere encephalomalacia. No acute intracranial abnormality identified. The patient has history of similar presentation in January, believed to be due to new CVA at that time vs complex partial seizures. Symptoms ultimately resolved but took months to do so. Repeated CT scans of her head show  no change to the infarct since that time.   Assessment / Plan / Recommendation Clinical Impression  Pt. with mild deficits from prior CVA Jan, 2014 in the areas of speech precision and mildly delayed thought processing in which she received ST at [facility] and was discharged.  She affrims deliberate speech pattern intermittently that she reports is her baseline, however speech is functional and intelligible.  Cognition is functional for moderately complex to complex information.  She reports utilizing strategies for memory and organization such as puill box, jouirnal, written reminders.  Spouse handles finances.  No ST warrented at this time.    SLP Assessment  Patient does not need any further Speech Lanaguage Pathology Services    Follow Up Recommendations  None    Frequency and Duration        Pertinent Vitals/Pain n/a       SLP Evaluation Prior Functioning  Cognitive/Linguistic Baseline: Baseline deficits Baseline deficit details:  (mildly delayed processing, dysarthria) Type of Home: House  Lives With: Spouse Available Help at Discharge: Family;Available 24 hours/day Vocation: On disability Freight forwarder)   Cognition  Overall Cognitive Status: Within Functional Limits for tasks assessed Orientation Level: Oriented X4 Safety/Judgment: Appears intact    Comprehension  Auditory Comprehension Overall Auditory Comprehension: Appears within functional limits for tasks assessed Visual Recognition/Discrimination Discrimination: Not tested    Expression Expression Primary Mode of Expression: Verbal Verbal Expression Overall Verbal Expression: Appears within functional limits for tasks assessed Written Expression Dominant Hand: Right Written Expression: Not tested   Oral / Motor Oral Motor/Sensory Function  Overall Oral Motor/Sensory Function: Impaired at baseline Labial ROM: Reduced left Labial Symmetry: Abnormal symmetry left Labial Strength: Within Functional  Limits Lingual ROM: Within Functional Limits Lingual Symmetry: Within Functional Limits Lingual Strength: Within Functional Limits Facial ROM: Within Functional Limits Facial Symmetry: Within Functional Limits Velum: Within Functional Limits Mandible: Within Functional Limits Motor Speech Overall Motor Speech: Appears within functional limits for tasks assessed   GO Functional Assessment Tool Used: clinical judgement Functional Limitations: Memory Memory Current Status (Z6109): At least 1 percent but less than 20 percent impaired, limited or restricted Memory Goal Status (U0454): At least 1 percent but less than 20 percent impaired, limited or restricted Memory Discharge Status [phone]): At least 1 percent but less than 20 percent impaired, limited or restricted   Royce Macadamia M.Ed [employer] [phone]  05/31/2013

## 2013-05-31 NOTE — Discharge Summary (Signed)
Physician Discharge Summary  Stacy Diaz ZOX:[id number] DOB: 30-May-1951 DOA: 05/30/2013  PCP: Evette Georges, MD  Admit date: 05/30/2013 Discharge date: 05/31/2013  Time spent: >35 minutes  Recommendations for Outpatient Follow-up:  1. Follow up with your neurologist regarding hospitalization and increase in Keppra 2. F/u with EEG results.  Discharge Diagnoses:  Active Problems:   HYPERLIPIDEMIA   HYPERTENSION   Paralysis of upper limb   Seizures   Facial droop   Discharge Condition: stable  Diet recommendation: heart healthy diet  Filed Weights   05/30/13 2324  Weight: 77.4 kg (170 lb 10.2 oz)    History of present illness:  62 y.o. female with history of seizures and meningioma where she had craniotomy that resulted with stroke with resultant left hemiparesis who presented to ED after episode of L sided facial droop and LUE weakness. Pt states this is similar to event that occurred in January. Pt is currently back to baseline. Given history of metal plate insertion after meningioma removal she is not able to get an MRI.    Hospital Course:  1. Transient facial droop and LUE weakness - TIA vs Seizure at this point suspecting seizure more than TIA - Neurology managed while patient in house. - CT: no acute abnormality and no interval change  - MRI: pt unable to do d/t metal plates in head from brain tumor removal  - Plavix 75 mg oral daily  to be continued  - Per neurology carotid doopler and echo d/c as no need for further work up   2. Seizure  -EEG results pending  - Keppra increased to 1000 mg BID and plan will be to discharge on this regimen - Neuro to follow up with EEG results once available. But ok for discharge from neurology standpoint on higher dose of Keppra  3. HTN - stable will discharge on home regimen  4. HPL - stable on lipitor.   Procedures:  WJX:BJYNWGN  CT: no acute abnormality and no interval change    Consultations:  Neurology  Discharge Exam: Filed Vitals:   05/31/13 1000  BP: 129/85  Pulse: 75  Temp: 97.9 F (36.6 C)  Resp: 18    General: Pt lying in bed in NAD.  Cardiovascular: S1 and S2 present with no MRG. Radial and pedal pulses 2+ with no cyanosis or edema Respiratory: regular unlabored breathing. Lungs CTA with no wheeze or rhonchi  Abdomen: Symmetric, soft, ND, NT. BS x 4 Musculoskeletal: Pt moves all extremities equally. BL grips equal with no drift present.  Neurologic: no facial droop or ptosis noted. Tongue midline. Sensation present in all extremities.    Discharge Instructions   Future Appointments Provider Department Dept Phone   06/02/2013 12:30 PM Mena Pauls, PT [facility] [phone]   06/02/2013 1:15 PM Colonel Bald, [place] [facility] [phone]   06/06/2013 11:00 AM Mena Pauls, PT [facility] [phone]   06/06/2013 11:45 AM Colonel Bald, [place] [facility] [phone]   06/09/2013 12:30 PM Mena Pauls, PT [facility] [phone]   06/09/2013 1:15 PM Colonel Bald, [place] [facility] [phone]   06/13/2013 11:45 AM Colonel Bald, OT [facility] [phone]   06/16/2013 11:45 AM Colonel Bald, OT [facility] [phone]   06/20/2013 11:00 AM Colonel Bald, [place] [facility] [phone]   06/23/2013 11:45 AM Colonel Bald, OT [facility] [phone]   07/17/2013 9:30 AM [facility] [place] [phone]   07/24/2013 2:45 PM Roderick Pee, MD [facility] [place]  [phone]       Medication List    ASK your doctor about these medications       atenolol-chlorthalidone 50-25 MG per tablet  Commonly known as:  TENORETIC  Take 1 tablet by mouth daily.     atorvastatin 10 MG tablet  Commonly known as:  LIPITOR  Take 1 tablet (10 mg total) by mouth daily.     baclofen 20 MG tablet  Commonly known as:  LIORESAL  Take 1 tablet (20 mg total) by mouth 4 (four) times daily.     clopidogrel 75 MG tablet  Commonly known as:  PLAVIX  Take 1 tablet (75 mg total) by mouth daily with breakfast.     diazepam 5 MG tablet  Commonly known as:  VALIUM  Take 1 tablet (5 mg total) by mouth at bedtime.     DSS 100 MG Caps  Take 100 mg by mouth 2 (two) times daily.     HYDROcodone-acetaminophen 7.5-325 MG per tablet  Commonly known as:  NORCO  Take 1 tablet by mouth every 4 (four) hours as needed for pain.              lidocaine 5 %  Commonly known as:  LIDODERM  Place 1 patch onto the skin daily as needed (usually twice weekly, only as needed). Remove & Discard patch within 12 hours or as directed by MD     LORazepam 0.5 MG tablet  Commonly known as:  ATIVAN  Take 1 tablet (0.5 mg total) by mouth every 6 (six) hours as needed for anxiety.     Melatonin 3 MG Tabs  Take 1 tablet (3 mg total) by mouth at bedtime.     methocarbamol 500 MG tablet  Commonly known as:  ROBAXIN  Take 1 tablet (500 mg total) by mouth 3 (three) times daily.     polyethylene glycol packet  Commonly known as:  MIRALAX / GLYCOLAX  Take 17 g by mouth daily as needed (for constipation).     potassium chloride 10 MEQ tablet  Commonly known as:  K-DUR,KLOR-CON  Take 1 tablet (10 mEq total) by mouth 2 (two) times daily.     zinc oxide 11.3 % Crea cream  Commonly known as:  BALMEX  Apply 1 application topically 3 (three) times daily.       levETIRAcetam (Keppra) 1000 mg by mouth two times a day.     No Known Allergies    The results of significant diagnostics  from this hospitalization (including imaging, microbiology, ancillary and laboratory) are listed below for reference.    Significant Diagnostic Studies: Dg Chest 2 View  05/30/2013   CLINICAL DATA:  Stroke protocol  EXAM: CHEST  2 VIEW  COMPARISON:  09/02/2012.  FINDINGS: The heart size and mediastinal contours are within normal limits. Both lungs are clear. The visualized skeletal structures are unremarkable.  IMPRESSION: No active cardiopulmonary disease.   Electronically Signed   By: Tiburcio Pea M.D.   On: 05/30/2013 22:35   Ct Head Wo Contrast  05/30/2013   CLINICAL DATA:  TIA. History brain tumor removal 15 years ago  EXAM: CT HEAD WITHOUT CONTRAST  TECHNIQUE: Contiguous axial images were obtained from the base of the skull through the vertex without intravenous contrast.  COMPARISON:  CT 09/02/2012  FINDINGS: Extensive bilateral craniectomy  over the convexity with cranioplasty unchanged from the prior study. Multiple metal clips are seen along the posterior falx, unchanged.  Ventricle size is normal. Chronic infarct right head of caudate. This is unchanged. Encephalomalacia in the frontal parietal lobes over the convexity bilaterally are and change related to prior surgery.  Negative for acute infarct. Negative for hemorrhage or mass lesion.  IMPRESSION: Postop tumor resection over the convexity, unchanged in appearance.  No acute abnormality and no interval change.   Electronically Signed   By: Marlan Palau M.D.   On: 05/30/2013 20:05    Microbiology: No results found for this or any previous visit (from the past 240 hour(s)).   Labs: Basic Metabolic Panel:  Recent Labs Lab 05/30/13 1919 05/30/13 2022  NA 140 140  K 4.0 3.6  CL 104 102  CO2 23  --   GLUCOSE 94 92  BUN 10 10  CREATININE 0.49* 0.80  CALCIUM 9.0  --    Liver Function Tests:  Recent Labs Lab 05/30/13 1919  AST 23  ALT 17  ALKPHOS 70  BILITOT 0.3  PROT 7.0  ALBUMIN 3.8   No results found for this  basename: LIPASE, AMYLASE,  in the last 168 hours No results found for this basename: AMMONIA,  in the last 168 hours CBC:  Recent Labs Lab 05/30/13 1919 05/30/13 2022  WBC 6.3  --   NEUTROABS 2.6  --   HGB 14.4 14.3  HCT 41.0 42.0  MCV 88.6  --   PLT 197  --    Cardiac Enzymes:  Recent Labs Lab 05/30/13 1919  TROPONINI <0.30   BNP: BNP (last 3 results) No results found for this basename: PROBNP,  in the last 8760 hours CBG:  Recent Labs Lab 05/30/13 1937  GLUCAP 88       Signed:  Penny Pia  [facility] 05/31/2013, 12:45 PM

## 2013-05-31 NOTE — Evaluation (Signed)
Occupational Therapy Evaluation Patient Details Name: Stacy Diaz MRN: [id number] DOB: [birth year] Today's Date: 05/31/2013 Time: 7829-5621 OT Time Calculation (min): 32 min  OT Assessment / Plan / Recommendation History of present illness  62 y.o. female with a complex neurologic history including craniotomy for meningioma with resulting stroke. The patient presents to the ED after a 30 min episode of L sided facial droop and LUE weakness that occurred earlier today. EMS was called but by the time they arrived her symptoms were resolving and she is currently back to baseline. CT negative for workup. Pt at baseline is wheelchair bound and incontinent.   Clinical Impression   PT admitted with visual changes and left side weakness. Pt currently with functional limitiations due to the deficits listed below (see OT problem list).  Pt will benefit from skilled OT to increase their independence and safety with adls and balance to allow discharge outpatient. Session limited evaluation due to no BIL LE braces present to attempt transfers. OT to reattempt this PM once husband arrives to assess transfers. Pt noted to have Rt superior quadrant visual deficits.     OT Assessment  Patient needs continued OT Services    Follow Up Recommendations  Outpatient OT;Supervision/Assistance - 24 hour    Barriers to Discharge      Equipment Recommendations  None recommended by OT    Recommendations for Other Services    Frequency  Min 2X/week    Precautions / Restrictions Precautions Precautions: Fall Required Braces or Orthoses: Other Brace/Splint Other Brace/Splint: Bil LE knee braces, BIL AFO, uses a  lazy susan disc to pivot to chair with husband   Pertinent Vitals/Pain No pain reported at this time    ADL  Eating/Feeding: Set up Where Assessed - Eating/Feeding: Bed level Grooming: Wash/dry hands;Wash/dry face;Set up Where Assessed - Grooming: Supine, head of bed up Lower Body Dressing: +1  Total assistance Where Assessed - Lower Body Dressing: Supine, head of bed up ADL Comments: Pt currently supine in bed with void of bladder. Pt max (A) to log roll right and left for peri hygiene. Pt does not have bil LE braces/ afo present for transfers. Session focused on bed level care and vision assessment. PT called husband for therapist and husband to bring braces so transfers can be assessed later today. Pt reports feeling at baseline. pt with Rt eye deficits noted (see below) pt PTA was going to outpatient therapy for PT / OT . OT was working on fine motor ( finger translation exercises).     OT Diagnosis: Generalized weakness  OT Problem List: Decreased strength;Decreased activity tolerance OT Treatment Interventions: Self-care/ADL training;Therapeutic exercise;Neuromuscular education;DME and/or AE instruction;Therapeutic activities;Patient/family education;Balance training   OT Goals(Current goals can be found in the care plan section) Acute Rehab OT Goals Patient Stated Goal: to return to outpatient therapy OT Goal Formulation: With patient Time For Goal Achievement: 06/14/13 Potential to Achieve Goals: Good  Visit Information  Last OT Received On: 05/31/13 Assistance Needed: +2 (uncertain due to no braces present for OT eval) History of Present Illness:  62 y.o. female with a complex neurologic history including craniotomy for meningioma with resulting stroke. The patient presents to the ED after a 30 min episode of L sided facial droop and LUE weakness that occurred earlier today. EMS was called but by the time they arrived her symptoms were resolving and she is currently back to baseline. CT negative for workup. Pt at baseline is wheelchair bound and incontinent.  Prior Functioning     Home Living Family/patient expects to be discharged to:: Private residence Living Arrangements: Spouse/significant other Available Help at Discharge: Family;Available 24 hours/day Type  of Home: House Home Access: Other (comment) (lift to help get w/c in house) Home Layout: One level Home Equipment: Electric scooter;Grab bars - toilet;Grab bars - tub/shower;Hand held shower head;Walker - 2 wheels Additional Comments: stands inside RW after void of bowel for peri hygiene Prior Function Level of Independence: Needs assistance Gait / Transfers Assistance Needed: unable to ambulate but completes stand pivots with lazy susan ADL's / Homemaking Assistance Needed: Pt has (A) from husband for bathing, dressing, and transfers.  Comments: car has modifications to allow transfer from ground level using lazy susan pivot into seat and then seat lifts back into the car Communication Communication: No difficulties Dominant Hand: Right         Vision/Perception Vision - History Baseline Vision: Wears glasses all the time Vision - Assessment Eye Alignment: Within Functional Limits Vision Assessment: Vision tested Ocular Range of Motion: Within Functional Limits Tracking/Visual Pursuits: Decreased smoothness of vertical tracking;Impaired - to be further tested in functional context Saccades: Decreased speed of saccadic movement Convergence: Within functional limits Additional Comments: Pt with Lt eye occluded and noted to have visual deficits in Rt superior quadrant. pt reports inability to see object in Rt superior quadrant to midline peripherial vision. Pt noted to have nystagmus with all tracking and incr nystagmus with single eye occlusion. Pt reports no visual changes with using bil eye vision.    Cognition  Cognition Arousal/Alertness: Awake/alert Behavior During Therapy: WFL for tasks assessed/performed Overall Cognitive Status: Within Functional Limits for tasks assessed    Extremity/Trunk Assessment Upper Extremity Assessment Upper Extremity Assessment: Overall WFL for tasks assessed Lower Extremity Assessment Lower Extremity Assessment: Defer to PT  evaluation Cervical / Trunk Assessment Cervical / Trunk Assessment: Normal     Mobility Bed Mobility Bed Mobility: Rolling Right;Rolling Left Rolling Right: 2: Max assist;With rail Rolling Left: 2: Max assist;With rail Details for Bed Mobility Assistance: Pt with bil LE knee extension and needs (A) to roll like a log. Pt using BIL UE on bed rail causing segmental roll. Transfers Transfers: Not assessed (unable to assess due to no braces for BIL LE)     Exercise     Balance     End of Session OT - End of Session Activity Tolerance: Patient tolerated treatment well Patient left: in bed;with call bell/phone within reach Nurse Communication: Mobility status;Need for lift equipment;Precautions  GO Functional Assessment Tool Used: clinical judgement Functional Limitation: Self care Self Care Current Status (K4401): At least 1 percent but less than 20 percent impaired, limited or restricted Self Care Goal Status (U2725): At least 1 percent but less than 20 percent impaired, limited or restricted   Harolyn Rutherford 05/31/2013, 9:07 AM Pager: [phone]

## 2013-06-02 ENCOUNTER — Ambulatory Visit: Payer: Medicare Other

## 2013-06-02 ENCOUNTER — Ambulatory Visit: Payer: Medicare Other | Admitting: Occupational Therapy

## 2013-06-06 ENCOUNTER — Telehealth: Payer: Self-pay | Admitting: Family Medicine

## 2013-06-06 ENCOUNTER — Ambulatory Visit: Payer: Medicare Other

## 2013-06-06 ENCOUNTER — Ambulatory Visit: Payer: Medicare Other | Admitting: Occupational Therapy

## 2013-06-06 NOTE — Telephone Encounter (Signed)
Pt request rx for HYDROcodone-acetaminophen (NORCO) 7.5-325 MG per tablet  (this not filled by dr todd last time)

## 2013-06-07 MED ORDER — HYDROCODONE-ACETAMINOPHEN 7.5-325 MG PO TABS: 1.0000 | ORAL_TABLET | ORAL | Status: DC | PRN

## 2013-06-08 MED ORDER — HYDROCODONE-ACETAMINOPHEN 7.5-325 MG PO TABS: 1.0000 | ORAL_TABLET | ORAL | Status: DC | PRN

## 2013-06-08 NOTE — Telephone Encounter (Signed)
Rx ready for pick up and husband is aware 

## 2013-06-09 ENCOUNTER — Ambulatory Visit: Payer: Medicare Other

## 2013-06-09 ENCOUNTER — Ambulatory Visit: Payer: Medicare Other | Admitting: Occupational Therapy

## 2013-06-09 NOTE — Telephone Encounter (Signed)
Patients primary ordered pt Referrals for the patient, no future actions need to be taking.

## 2013-06-13 ENCOUNTER — Encounter: Payer: Medicare Other | Admitting: Occupational Therapy

## 2013-06-13 ENCOUNTER — Ambulatory Visit: Payer: Medicare Other | Admitting: Rehabilitative and Restorative Service Providers"

## 2013-06-14 ENCOUNTER — Ambulatory Visit: Payer: Medicare Other | Admitting: Physical Therapy

## 2013-06-14 ENCOUNTER — Ambulatory Visit: Payer: Medicare Other | Admitting: Occupational Therapy

## 2013-06-16 ENCOUNTER — Encounter: Payer: Medicare Other | Admitting: Occupational Therapy

## 2013-06-19 ENCOUNTER — Telehealth: Payer: Self-pay | Admitting: Family Medicine

## 2013-06-19 DIAGNOSIS — M62838 Other muscle spasm: Secondary | ICD-10-CM

## 2013-06-19 MED ORDER — LEVETIRACETAM 1000 MG PO TABS: 1000.0000 mg | ORAL_TABLET | Freq: Two times a day (BID) | ORAL | Status: DC

## 2013-06-19 MED ORDER — BACLOFEN 20 MG PO TABS: 20.0000 mg | ORAL_TABLET | Freq: Four times a day (QID) | ORAL | Status: DC

## 2013-06-19 NOTE — Telephone Encounter (Signed)
Rx sent to pharmacy   

## 2013-06-19 NOTE — Telephone Encounter (Signed)
Pt needs levETIRAcetam (KEPPRA) 1000 MG tablet and baclofen (LIORESAL) 20 MG tablet Rx re-fill called in to [facility] on [address], 90 day script.

## 2013-06-20 ENCOUNTER — Ambulatory Visit: Payer: Medicare Other

## 2013-06-20 ENCOUNTER — Ambulatory Visit: Payer: Medicare Other | Attending: Family Medicine | Admitting: Occupational Therapy

## 2013-06-20 DIAGNOSIS — R279 Unspecified lack of coordination: Secondary | ICD-10-CM | POA: Insufficient documentation

## 2013-06-20 DIAGNOSIS — Z5189 Encounter for other specified aftercare: Secondary | ICD-10-CM | POA: Insufficient documentation

## 2013-06-20 DIAGNOSIS — M2420 Disorder of ligament, unspecified site: Secondary | ICD-10-CM | POA: Insufficient documentation

## 2013-06-20 DIAGNOSIS — M629 Disorder of muscle, unspecified: Secondary | ICD-10-CM | POA: Insufficient documentation

## 2013-06-23 ENCOUNTER — Ambulatory Visit: Payer: Medicare Other | Admitting: Occupational Therapy

## 2013-06-23 ENCOUNTER — Ambulatory Visit: Payer: Medicare Other

## 2013-06-27 ENCOUNTER — Ambulatory Visit: Payer: Medicare Other | Admitting: Occupational Therapy

## 2013-06-27 ENCOUNTER — Ambulatory Visit: Payer: Medicare Other

## 2013-06-29 ENCOUNTER — Ambulatory Visit: Payer: Medicare Other | Admitting: Occupational Therapy

## 2013-06-29 ENCOUNTER — Ambulatory Visit: Payer: Medicare Other

## 2013-07-10 ENCOUNTER — Other Ambulatory Visit: Payer: Self-pay | Admitting: *Deleted

## 2013-07-10 DIAGNOSIS — F411 Generalized anxiety disorder: Secondary | ICD-10-CM

## 2013-07-10 MED ORDER — LORAZEPAM 0.5 MG PO TABS: 0.5000 mg | ORAL_TABLET | Freq: Four times a day (QID) | ORAL | Status: DC | PRN

## 2013-07-11 ENCOUNTER — Telehealth: Payer: Self-pay | Admitting: *Deleted

## 2013-07-11 ENCOUNTER — Ambulatory Visit (INDEPENDENT_AMBULATORY_CARE_PROVIDER_SITE_OTHER): Payer: Medicare Other | Admitting: Neurology

## 2013-07-11 ENCOUNTER — Encounter: Payer: Self-pay | Admitting: Neurology

## 2013-07-11 VITALS — BP 139/76 | HR 60 | Temp 97.9°F | Ht 63.0 in | Wt 170.0 lb

## 2013-07-11 DIAGNOSIS — R569 Unspecified convulsions: Secondary | ICD-10-CM

## 2013-07-11 MED ORDER — HYDROCODONE-ACETAMINOPHEN 7.5-325 MG PO TABS: 1.0000 | ORAL_TABLET | ORAL | Status: DC | PRN

## 2013-07-11 NOTE — Telephone Encounter (Signed)
Ok to fill 

## 2013-07-11 NOTE — Telephone Encounter (Signed)
Patient is calling requesting a refill of her hydrocodone 7.5/3.25 #180, last fill 06/08/13.  Is this okay to fill?

## 2013-07-11 NOTE — Progress Notes (Signed)
[facility]  PATIENT: Stacy Diaz DOB: 08-Apr-1951   HISTORY FROM: patient, husband and chart REASON FOR VISIT: hospital follow up for stroke and seizure 0n 05/30/13  HISTORY OF PRESENT ILLNESS:  Stacy Diaz is a 62 year old female with history of previous meningioma resection with subsequent spastic paraparesis who developed left arm weakness and facial droop on the morning of 08/25/2012. She had multiple episodes in the past similar to the episode above but this one had been persistent.  MRI was not an option since she had history of no clips to 2 meningioma resection. Since her surgery she has had trouble with weakness from waist down. At her baseline she had trouble angulating for the past 4 years but could bear weight on her legs but needed help with transfers. Lately her legs have been nonweight bearing at all.   She was admitted to [facility] for workup and CT was negative for acute infarct. She was discharged  to [facility] after 3 days. On the third day there her symptoms became worse and she was readmitted to the hospital. On the second day of her second hospitalization she had a seizure that was witnessed and included loss of consciousness with a blank stare and lasted approximately 45 minutes. She suffered another seizure that was shorter in length approximately 10 minutes on the following day. She has not had any known recurrent seizures since that time. She was discharged on Keppra 750 mg twice a day and continues with that dose without any side effects.  She was discharged on Plavix 75 mg daily and continues with that dose.  Patient denies medication side effects, with no signs of bleeding.  Patient is in outpatient therapy. Update 07/11/13 : She is seen for her first office followup visit following hospital admission on 05/31/39 and with sudden onset of left facial droop and left upper x-ray the weakness and numbness. She started improving by the time EMS  came and she was back to nearly baseline within 1 hour. CT scan of the head showed no acute abnormality. MRI could not be done because she has a metal surgical plate following meningioma resection surgery 10 years ago. She had EEG which was unremarkable. She was previously on Keppra 750 twice daily the dose of which was increased to 1 g twice daily. She states she has finished outpatient physical and occupational therapy and is almost 90% back to her baseline. She has spastic paraplegia following meningioma surgery 10 years ago and her similar episode left face and arm weakness possibly right brain stroke in January 2014. She is tolerating increase to Keppra but does complain of mild tiredness which seemed to be improving but is not back to her baseline. She is also tolerating Plavix without bleeding, bruising or other side effects. She states her blood pressure is well controlled shoulder slightly elevated at 139/76 today. She had EEG done on 01/05/13 which showed no evidence of seizure activity. REVIEW OF SYSTEMS: Full 14 system review of systems performed and notable only for constitutional: Fatigue eyes: Blurred vision respiratory: snoring, hematology: Easy bruising gastrointestinal: Incontinence, constipation musculoskeletal: Joint pain, cramps, aching muscles skin: Rash, moles, Itching genitourinary: Incontinence allergy/immunology: Allergies, neurological:  weakness, seizure sleep: Insomnia, snoring psychiatric: anxiety,    ALLERGIES: No Known Allergies  HOME MEDICATIONS: Outpatient Prescriptions Prior to Visit  Medication Sig Dispense Refill  . atenolol-chlorthalidone (TENORETIC) 50-25 MG per tablet Take 1 tablet by mouth daily.  30 tablet  0  . atorvastatin (LIPITOR)  10 MG tablet Take 1 tablet (10 mg total) by mouth daily.  100 tablet  3  . baclofen (LIORESAL) 20 MG tablet Take 1 tablet (20 mg total) by mouth 4 (four) times daily.  360 each  3  . clopidogrel (PLAVIX) 75 MG tablet Take 1 tablet  (75 mg total) by mouth daily with breakfast.  100 tablet  3  . diazepam (VALIUM) 5 MG tablet Take 1 tablet (5 mg total) by mouth at bedtime.  90 tablet  5  . Docusate Sodium (DSS) 100 MG CAPS Take 100 mg by mouth 2 (two) times daily.      Marland Kitchen levETIRAcetam (KEPPRA) 1000 MG tablet Take 1 tablet (1,000 mg total) by mouth 2 (two) times daily.  180 tablet  3  . lidocaine (LIDODERM) 5 % Place 1 patch onto the skin daily as needed (usually twice weekly, only as needed). Remove & Discard patch within 12 hours or as directed by MD      . LORazepam (ATIVAN) 0.5 MG tablet Take 1 tablet (0.5 mg total) by mouth every 6 (six) hours as needed for anxiety.  120 tablet  1  . Melatonin 3 MG TABS Take 1 tablet (3 mg total) by mouth at bedtime.  90 tablet  3  . methocarbamol (ROBAXIN) 500 MG tablet Take 1 tablet (500 mg total) by mouth 3 (three) times daily.  90 tablet  0  . polyethylene glycol (MIRALAX / GLYCOLAX) packet Take 17 g by mouth daily as needed (for constipation).      . potassium chloride (K-DUR,KLOR-CON) 10 MEQ tablet Take 1 tablet (10 mEq total) by mouth 2 (two) times daily.  60 tablet  0  . zinc oxide (BALMEX) 11.3 % CREA cream Apply 1 application topically 3 (three) times daily.       No facility-administered medications prior to visit.    PAST MEDICAL HISTORY: Past Medical History  Diagnosis Date  . HTN (hypertension)   . Hyperlipidemia   . Meningioma 1999  . Myocardial infarction 1985?  Marland Kitchen Headache(784.0)     "not real frequent" (08/25/2012)  . Arthritis     "probably" (08/25/2012)  . Stroke 08/25/2012    "that's what they think I've had; sudden LUE weakness" (08/25/2012)  . Spastic paraparesis     S/P meningioma resection 1999/notes 08/25/2012  . Paresis of lower extremity 1999    BLE/notes 08/25/2012  . Anxiety   . Heart disease     PAST SURGICAL HISTORY: Past Surgical History  Procedure Laterality Date  . Coronary angioplasty  1970's  . Cardiac catheterization  1970's  . Brain meningioma  excision  1999    FAMILY HISTORY: Family History  Problem Relation Age of Onset  . Breast cancer Mother   . Heart disease Father     SOCIAL HISTORY: History   Social History  . Marital Status: Married    Spouse Name: david    Number of Children: 2  . Years of Education: college   Occupational History  . disabled    Social History Main Topics  . Smoking status: Former Smoker -- 2.00 packs/day for 10 years  . Smokeless tobacco: Never Used     Comment: 08/25/2012 "quit smoking cigarettes in 1985 when I had my heart attack"  . Alcohol Use: 4.2 oz/week    7 Glasses of wine per week     Comment: 08/25/2012 "glass of wine qd"  . Drug Use: No  . Sexual Activity: No   Other Topics Concern  .  Not on file   Social History Narrative  . No narrative on file     PHYSICAL EXAM  Filed Vitals:   07/11/13 1407  BP: 139/76  Pulse: 60  Temp: 97.9 F (36.6 C)  TempSrc: Oral  Height: 5\' 3"  (1.6 m)  Weight: 170 lb (77.111 kg)   Body mass index is 30.12 kg/(m^2).  GENERAL EXAM: Patient is in no distress, pleasant, wheelchair bound HEAD: Symmetric facial features. EARS, NOSE, and THROAT: Normal.  NECK: Supple, no JVD RESPIRATORY: Lungs CTA. CARDIOVASCULAR: Regular rate and rhythm, no murmurs, no carotid bruits SKIN: No rash, no bruising  NEUROLOGIC: MENTAL STATUS: awake, alert and oriented to person, place and time, language fluent, comprehension intact, naming intact CRANIAL NERVE:  pupils equal and reactive to light, visual fields full to confrontation, mild facial asymmetry on the left, uvula midline, shoulder shrug symmetric, tongue midline. MOTOR: Significant weakness in right and left lower extremities with only 2/5 strength honey extension and 0/5 at the hips ankles and toes. Tone is increase in the both lower extremities, 4/5 strength left upper extremity,  fine finger movements decreased on left SENSORY: normal and symmetric to light touch COORDINATION:  finger-nose-finger normal REFLEXES: deep tendon reflexes BUE present and symmetric 2+, BLE not tested. GAIT/STATION:  Wheelchair-bound.   DIAGNOSTIC DATA (LABS, IMAGING, TESTING) - I reviewed patient records, labs, notes, testing and imaging myself where available.  Lab Results  Component Value Date   WBC 6.3 05/30/2013   HGB 14.3 05/30/2013   HCT 42.0 05/30/2013   MCV 88.6 05/30/2013   PLT 197 05/30/2013      Component Value Date/Time   NA 140 05/30/2013 2022   K 3.6 05/30/2013 2022   CL 102 05/30/2013 2022   CO2 23 05/30/2013 1919   GLUCOSE 92 05/30/2013 2022   BUN 10 05/30/2013 2022   CREATININE 0.80 05/30/2013 2022   CALCIUM 9.0 05/30/2013 1919   PROT 7.0 05/30/2013 1919   ALBUMIN 3.8 05/30/2013 1919   AST 23 05/30/2013 1919   ALT 17 05/30/2013 1919   ALKPHOS 70 05/30/2013 1919   BILITOT 0.3 05/30/2013 1919   GFRNONAA >90 05/30/2013 1919   GFRAA >90 05/30/2013 1919   Lab Results  Component Value Date   CHOL 191 05/31/2013   HDL 62 05/31/2013   LDLCALC 108* 05/31/2013   LDLDIRECT 142.6 03/26/2011   TRIG 107 05/31/2013   CHOLHDL 3.1 05/31/2013   Lab Results  Component Value Date   HGBA1C 5.4 05/31/2013   No results found for this basename: [id number]   Lab Results  Component Value Date   TSH 0.72 05/10/2012     ASSESSMENT AND PLAN  Stacy Diaz is a 62 year old female with history of previous meningioma resection with subsequent spastic paraparesis who developed left arm weakness and facial droop on the morning of 08/25/2012. Felt to be stroke although never confirmed on CT.  She was discharged on Plavix 75 mg daily. She experienced 2 partial complex seizures while in the hospital and was discharged on Keppra 750 mg twice a day. She has had a recurrent episode of left face and upper extremity weakness and numbness possible right brain stroke versus seizure in October 2014 PLAN: Continue clopidogrel 75 mg orally every day  for secondary stroke  prevention and maintain strict control of hypertension with blood pressure goal below 130/90, lipids with LDL cholesterol goal below 100 mg/dL.  Continue Keppra 1 g mg BID for seizure.  Return for follow up appt in 3  months with Heide Guile, NP.  Delia Heady, MD     07/11/2013, 2:48 PM  [facility] [address], [address] [place], [place] [address] [phone]

## 2013-07-11 NOTE — Telephone Encounter (Signed)
She has not been in in a long time. Needs OV.... Is she in a nursing home?

## 2013-07-11 NOTE — Patient Instructions (Signed)
Continue Keppra 1 g twice daily for seizures. Plavix 75 g daily for stroke prevention and strict control of hypertension with blood pressure goal below 140/90 and lipids the LDL cholesterol goal below 100 mg percent. Return for followup in 3 months with Heide Guile, nurse practitioner call earlier if necessary.

## 2013-07-11 NOTE — Telephone Encounter (Signed)
Patient's last office visit was 12/05/12.  She only comes in once a year to see Dr Tawanna Cooler because she is confined to a wheelchair due to her hx of brain tumor, stroke, and seizure.  Her husband is her care giver and she comes to the office when he can assist her.  Okay to fill?

## 2013-07-11 NOTE — Telephone Encounter (Signed)
Rx ready for pick up and patient is aware 

## 2013-07-17 ENCOUNTER — Encounter: Payer: Self-pay | Admitting: Family Medicine

## 2013-07-17 ENCOUNTER — Other Ambulatory Visit (INDEPENDENT_AMBULATORY_CARE_PROVIDER_SITE_OTHER): Payer: Medicare Other

## 2013-07-17 DIAGNOSIS — I10 Essential (primary) hypertension: Secondary | ICD-10-CM

## 2013-07-17 DIAGNOSIS — E785 Hyperlipidemia, unspecified: Secondary | ICD-10-CM

## 2013-07-17 DIAGNOSIS — Z0000 Encounter for general adult medical examination without abnormal findings: Secondary | ICD-10-CM

## 2013-07-17 LAB — HEPATIC FUNCTION PANEL
ALT: 29 U/L (ref 0–35)
Alkaline Phosphatase: 63 U/L (ref 39–117)
Total Bilirubin: 0.6 mg/dL (ref 0.3–1.2)

## 2013-07-17 LAB — POCT URINALYSIS DIPSTICK
Bilirubin, UA: NEGATIVE
Glucose, UA: NEGATIVE
Ketones, UA: NEGATIVE
Nitrite, UA: NEGATIVE
Spec Grav, UA: 1.015
pH, UA: 7

## 2013-07-17 LAB — CBC WITH DIFFERENTIAL/PLATELET
Basophils Absolute: 0 10*3/uL (ref 0.0–0.1)
Eosinophils Absolute: 0.1 10*3/uL (ref 0.0–0.7)
HCT: 44.5 % (ref 36.0–46.0)
Lymphs Abs: 3.2 10*3/uL (ref 0.7–4.0)
MCHC: 33.8 g/dL (ref 30.0–36.0)
MCV: 89.2 fl (ref 78.0–100.0)
Monocytes Absolute: 0.5 10*3/uL (ref 0.1–1.0)
Platelets: 201 10*3/uL (ref 150.0–400.0)
RDW: 12.6 % (ref 11.5–14.6)

## 2013-07-17 LAB — BASIC METABOLIC PANEL
BUN: 8 mg/dL (ref 6–23)
CO2: 25 mEq/L (ref 19–32)
Chloride: 103 mEq/L (ref 96–112)
GFR: 111.89 mL/min (ref >60.00)
Glucose, Bld: 66 mg/dL — ABNORMAL LOW (ref 70–99)
Potassium: 3.1 mEq/L — ABNORMAL LOW (ref 3.5–5.1)

## 2013-07-17 LAB — LIPID PANEL
HDL: 59 mg/dL (ref >39.00)
LDL Cholesterol: 108 mg/dL — ABNORMAL HIGH (ref 0–99)

## 2013-07-17 LAB — TSH: TSH: 1.55 u[IU]/mL (ref 0.35–5.50)

## 2013-07-21 ENCOUNTER — Encounter: Payer: Self-pay | Admitting: *Deleted

## 2013-07-24 ENCOUNTER — Encounter: Payer: Self-pay | Admitting: Family Medicine

## 2013-07-24 ENCOUNTER — Ambulatory Visit (INDEPENDENT_AMBULATORY_CARE_PROVIDER_SITE_OTHER): Payer: Medicare Other | Admitting: Family Medicine

## 2013-07-24 VITALS — BP 110/80 | Temp 97.9°F

## 2013-07-24 DIAGNOSIS — R569 Unspecified convulsions: Secondary | ICD-10-CM

## 2013-07-24 DIAGNOSIS — F411 Generalized anxiety disorder: Secondary | ICD-10-CM

## 2013-07-24 DIAGNOSIS — I6350 Cerebral infarction due to unspecified occlusion or stenosis of unspecified cerebral artery: Secondary | ICD-10-CM

## 2013-07-24 DIAGNOSIS — I10 Essential (primary) hypertension: Secondary | ICD-10-CM

## 2013-07-24 DIAGNOSIS — I639 Cerebral infarction, unspecified: Secondary | ICD-10-CM

## 2013-07-24 DIAGNOSIS — S8490XA Injury of unspecified nerve at lower leg level, unspecified leg, initial encounter: Secondary | ICD-10-CM

## 2013-07-24 DIAGNOSIS — E785 Hyperlipidemia, unspecified: Secondary | ICD-10-CM

## 2013-07-24 DIAGNOSIS — E876 Hypokalemia: Secondary | ICD-10-CM

## 2013-07-24 DIAGNOSIS — I2510 Atherosclerotic heart disease of native coronary artery without angina pectoris: Secondary | ICD-10-CM

## 2013-07-24 DIAGNOSIS — M62838 Other muscle spasm: Secondary | ICD-10-CM

## 2013-07-24 DIAGNOSIS — G8320 Monoplegia of upper limb affecting unspecified side: Secondary | ICD-10-CM

## 2013-07-24 MED ORDER — DIAZEPAM 5 MG PO TABS: 5.0000 mg | ORAL_TABLET | Freq: Every day | ORAL | Status: DC

## 2013-07-24 MED ORDER — ATENOLOL-CHLORTHALIDONE 50-25 MG PO TABS: 1.0000 | ORAL_TABLET | Freq: Every day | ORAL | Status: DC

## 2013-07-24 MED ORDER — BACLOFEN 20 MG PO TABS: 20.0000 mg | ORAL_TABLET | Freq: Four times a day (QID) | ORAL | Status: DC

## 2013-07-24 MED ORDER — ATORVASTATIN CALCIUM 10 MG PO TABS: 10.0000 mg | ORAL_TABLET | Freq: Every day | ORAL | Status: DC

## 2013-07-24 MED ORDER — METHOCARBAMOL 500 MG PO TABS: 500.0000 mg | ORAL_TABLET | Freq: Three times a day (TID) | ORAL | Status: DC

## 2013-07-24 MED ORDER — LORAZEPAM 0.5 MG PO TABS: 0.5000 mg | ORAL_TABLET | Freq: Four times a day (QID) | ORAL | Status: DC | PRN

## 2013-07-24 MED ORDER — OXYCODONE-ACETAMINOPHEN 10-325 MG PO TABS: ORAL_TABLET | ORAL | Status: DC

## 2013-07-24 MED ORDER — POTASSIUM CHLORIDE CRYS ER 10 MEQ PO TBCR: 10.0000 meq | EXTENDED_RELEASE_TABLET | Freq: Two times a day (BID) | ORAL | Status: DC

## 2013-07-24 MED ORDER — CLOPIDOGREL BISULFATE 75 MG PO TABS: 75.0000 mg | ORAL_TABLET | Freq: Every day | ORAL | Status: DC

## 2013-07-24 NOTE — Progress Notes (Signed)
   Subjective:    Patient ID: Stacy Diaz, female    DOB: 1951/07/03, 62 y.o.   MRN: [id number]  HPI [name] is a 62 year old female nonsmoker who comes in today for general checkup  She had a meningioma removed years ago but developed a spastic hemiparaplegia secondary. She's been confined to a wheelchair since that time. She was hospitalized overnight from October 14th to the 15th because of a seizure disorder. She says she was at home and noticed some numbness in her left arm and it didn't go away. She noticed in her left arm is totally non-. By the time EMS came and was starting to return. She was admitted observed and discharged the following day. Her neurologist increased her Keppra to 1000 mg twice a day  She's takes Tenoretic 50-25 for hypertension BP a little bit low 110/80 she's not lightheaded when she stands up.  Because of the brain tumor and secondary hemiplegia she has had chronic pain. It's been managed but she's had to increase her hydrocodone 7.5 mg 6 tablets daily. She would like to know if there are other options.  When she was in the nursing home this fall after the seizure Robaxin was added to the baclofen and that seems to help the muscle spasm.    Review of Systems Otherwise negative     Objective:   Physical Exam Well-developed well-nourished female no acute distress HEENT were negative neck was supple no adenopathy thyroid normal lungs are clear to auscultation cardiac exam normal breast exam normal except for chronically inverted left nipple. 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

## 2013-07-24 NOTE — Progress Notes (Signed)
Pre visit review using our clinic review tool, if applicable. No additional management support is needed unless otherwise documented below in the visit note. 

## 2013-07-24 NOTE — Patient Instructions (Signed)
Tenoretic,,,,,,,,,,, one half tab daily.......... check your blood pressure daily in the morning and fax me the data in 3 weeks at [phone]  Stop the hydrocodone  Start Percocet 5 mg 3 times daily  Call me in one month to review how the Percocet is helping control your pain

## 2013-09-13 ENCOUNTER — Telehealth: Payer: Self-pay | Admitting: Family Medicine

## 2013-09-13 NOTE — Telephone Encounter (Signed)
Pt states [employer] has let pt know there  is a quantity execption for her LORazepam (ATIVAN) 0.5 MG tablet [employer] will only approve 3/day. Pt is on 4/ day . pld advise

## 2013-09-14 MED ORDER — LORAZEPAM 1 MG PO TABS: 1.0000 mg | ORAL_TABLET | Freq: Two times a day (BID) | ORAL | Status: DC | PRN

## 2013-09-14 NOTE — Telephone Encounter (Signed)
Ativan 1 mg twice daily per Dr Sherren Mocha.  Patient is aware.

## 2013-09-18 ENCOUNTER — Telehealth: Payer: Self-pay | Admitting: Family Medicine

## 2013-09-18 MED ORDER — OXYCODONE-ACETAMINOPHEN 10-325 MG PO TABS: ORAL_TABLET | ORAL | Status: DC

## 2013-09-18 NOTE — Telephone Encounter (Signed)
Rx ready for pick up and husband is aware 

## 2013-09-18 NOTE — Telephone Encounter (Signed)
Pt request rx oxyCODONE-acetaminophen (PERCOCET) 10-325 MG per tablet

## 2013-10-10 ENCOUNTER — Ambulatory Visit: Payer: Medicare Other | Admitting: Nurse Practitioner

## 2013-10-11 ENCOUNTER — Telehealth: Payer: Self-pay | Admitting: Family Medicine

## 2013-10-11 DIAGNOSIS — G8320 Monoplegia of upper limb affecting unspecified side: Secondary | ICD-10-CM

## 2013-10-11 MED ORDER — METHOCARBAMOL 500 MG PO TABS
500.0000 mg | ORAL_TABLET | Freq: Three times a day (TID) | ORAL | Status: DC
Start: 2013-10-11 — End: 2014-04-10

## 2013-10-11 NOTE — Telephone Encounter (Signed)
[facility] [place] requesting refill methocarbamol (ROBAXIN) 500 MG tablet

## 2013-10-18 ENCOUNTER — Encounter: Payer: Self-pay | Admitting: Nurse Practitioner

## 2013-10-18 ENCOUNTER — Ambulatory Visit (INDEPENDENT_AMBULATORY_CARE_PROVIDER_SITE_OTHER): Payer: Medicare Other | Admitting: Nurse Practitioner

## 2013-10-18 VITALS — BP 135/84 | HR 70

## 2013-10-18 DIAGNOSIS — I639 Cerebral infarction, unspecified: Secondary | ICD-10-CM

## 2013-10-18 DIAGNOSIS — G40209 Localization-related (focal) (partial) symptomatic epilepsy and epileptic syndromes with complex partial seizures, not intractable, without status epilepticus: Secondary | ICD-10-CM

## 2013-10-18 DIAGNOSIS — I6350 Cerebral infarction due to unspecified occlusion or stenosis of unspecified cerebral artery: Secondary | ICD-10-CM

## 2013-10-18 NOTE — Progress Notes (Signed)
PATIENT: Stacy Diaz DOB: 1951/06/15   REASON FOR VISIT: follow up HISTORY FROM: patient  HISTORY OF PRESENT ILLNESS: Stacy Diaz is a 63 year old female with history of previous meningioma resection with subsequent spastic paraparesis who developed left arm weakness and facial droop on the morning of 08/25/2012. She had multiple episodes in the past similar to the episode above but this one had been persistent. MRI was not an option since she had history of no clips to 2 meningioma resection. Since her surgery she has had trouble with weakness from waist down. At her baseline she had trouble angulating for the past 4 years but could bear weight on her legs but needed help with transfers. Lately her legs have been nonweight bearing at all.  She was admitted to [facility] for workup and CT was negative for acute infarct. She was discharged to [facility] after 3 days. On the third day there her symptoms became worse and she was readmitted to the hospital. On the second day of her second hospitalization she had a seizure that was witnessed and included loss of consciousness with a blank stare and lasted approximately 45 minutes. She suffered another seizure that was shorter in length approximately 10 minutes on the following day. She has not had any known recurrent seizures since that time. She was discharged on Keppra 750 mg twice a day and continues with that dose without any side effects.  She was discharged on Plavix 75 mg daily and continues with that dose. Patient denies medication side effects, with no signs of bleeding. Patient is in outpatient therapy.   Update 07/11/13 (PS): She is seen for her first office followup visit following hospital admission on 05/31/39 and with sudden onset of left facial droop and left upper weakness and numbness. She started improving by the time EMS came and she was back to nearly baseline within 1 hour. CT scan of the head showed no acute  abnormality. MRI could not be done because she has a metal surgical plate following meningioma resection surgery 10 years ago. She had EEG which was unremarkable. She was previously on Keppra 750 twice daily the dose of which was increased to 1 g twice daily. She states she has finished outpatient physical and occupational therapy and is almost 90% back to her baseline. She has spastic paraplegia following meningioma surgery 10 years ago and her similar episode left face and arm weakness possibly right brain stroke in January 2014. She is tolerating increase to Keppra but does complain of mild tiredness which seemed to be improving but is not back to her baseline. She is also tolerating Plavix without bleeding, bruising or other side effects. She states her blood pressure is well controlled, slightly elevated at 139/76 today. She had EEG done on 01/05/13 which showed no evidence of seizure activity.   UPDATE 10/18/13 (LL):   She returns to the office for follow up, she reports she is doing well, back to her baseline.  Gets very tired by the end of the day and due to core muscle weakness she is often leaning to the right in her wheelchair by the late afternoon.  She has a new aide at home who is starting to help her do crunches and resistance band exercises.  She is tolerating Keppra well with no recurrent partial seizures.  Continues on Plavix, patient denies medication side effects, with no signs of bleeding.  REVIEW OF SYSTEMS: Full 14 system review of systems performed and  notable only for constitutional: Fatigue  gastrointestinal: Incontinence, constipation musculoskeletal: Joint pain, cramps, aching muscles,back pain, walking difficulty, coordination problems genitourinary: Incontinence, frequency of urination neurological: weakness, seizure, weakness sleep: Insomnia, frequent waking, snoring    ALLERGIES: No Known Allergies  HOME MEDICATIONS: Outpatient Prescriptions Prior to Visit  Medication Sig  Dispense Refill  . atenolol-chlorthalidone (TENORETIC) 50-25 MG per tablet Take 1 tablet by mouth daily.  100 tablet  3  . atorvastatin (LIPITOR) 10 MG tablet Take 1 tablet (10 mg total) by mouth daily.  100 tablet  3  . baclofen (LIORESAL) 20 MG tablet Take 1 tablet (20 mg total) by mouth 4 (four) times daily.  360 each  3  . clopidogrel (PLAVIX) 75 MG tablet Take 1 tablet (75 mg total) by mouth daily with breakfast.  100 tablet  3  . diazepam (VALIUM) 5 MG tablet Take 1 tablet (5 mg total) by mouth at bedtime.  90 tablet  5  . Docusate Sodium (DSS) 100 MG CAPS Take 100 mg by mouth 2 (two) times daily.      Marland Kitchen levETIRAcetam (KEPPRA) 1000 MG tablet Take 1 tablet (1,000 mg total) by mouth 2 (two) times daily.  180 tablet  3  . lidocaine (LIDODERM) 5 % Place 1 patch onto the skin daily as needed (usually twice weekly, only as needed). Remove & Discard patch within 12 hours or as directed by MD      . LORazepam (ATIVAN) 1 MG tablet Take 1 tablet (1 mg total) by mouth 2 (two) times daily as needed for anxiety.  180 tablet  3  . Melatonin 3 MG TABS Take 1 tablet (3 mg total) by mouth at bedtime.  90 tablet  3  . methocarbamol (ROBAXIN) 500 MG tablet Take 1 tablet (500 mg total) by mouth 3 (three) times daily.  300 tablet  3  . oxyCODONE-acetaminophen (PERCOCET) 10-325 MG per tablet 1 by mouth 3 times a day  180 tablet  0  . polyethylene glycol (MIRALAX / GLYCOLAX) packet Take 17 g by mouth daily as needed (for constipation).      . potassium chloride (K-DUR,KLOR-CON) 10 MEQ tablet Take 1 tablet (10 mEq total) by mouth 2 (two) times daily.  200 tablet  3  . zinc oxide (BALMEX) 11.3 % CREA cream Apply 1 application topically 3 (three) times daily.       No facility-administered medications prior to visit.    PHYSICAL EXAM  There were no vitals filed for this visit. There is no weight on file to calculate BMI.  GENERAL EXAM: Patient is in no distress, pleasant, wheelchair bound  HEAD: Symmetric  facial features.  EARS, NOSE, and THROAT: Normal.  NECK: Supple, no JVD  RESPIRATORY: Lungs CTA.  CARDIOVASCULAR: Regular rate and rhythm, no murmurs, no carotid bruits  SKIN: No rash, no bruising   NEUROLOGIC:  MENTAL STATUS: awake, alert and oriented to person, place and time, language fluent, comprehension intact, naming intact  CRANIAL NERVE: pupils equal and reactive to light, visual fields full to confrontation, mild facial asymmetry on the left, uvula midline, shoulder shrug symmetric, tongue midline.  MOTOR: Significant weakness in right and left lower extremities with only 2/5 strength honey extension and 0/5 at the hips ankles and toes. Tone is increase in the both lower extremities, 4/5 strength left upper extremity, fine finger movements decreased on left  SENSORY: normal and symmetric to light touch  COORDINATION: finger-nose-finger normal  REFLEXES: deep tendon reflexes BUE present and symmetric 2+,  BLE not tested.  GAIT/STATION: Wheelchair-bound.   ASSESSMENT AND PLAN Stacy Diaz is a 63 year old female with history of previous meningioma resection with subsequent spastic paraparesis who developed left arm weakness and facial droop on the morning of 08/25/2012. Felt to be stroke although never confirmed on CT. She was discharged on Plavix 75 mg daily. She experienced 2 partial complex seizures while in the hospital and was discharged on Keppra 750 mg twice a day. She has had a recurrent episode of left face and upper extremity weakness and numbness possible right brain stroke versus seizure in October 2014.   PLAN:  Continue clopidogrel 75 mg orally every day for secondary stroke prevention and maintain strict control of hypertension with blood pressure goal below 130/90, lipids with LDL cholesterol goal below 100 mg/dL.  Continue Keppra 1 g mg BID for seizure.  Return for follow up appt in 6 months, sooner as needed.  Philmore Pali, MSN, NP-C 10/18/2013, 3:53 PM [facility] [address], [place], [place] [address] [phone]  Note: This document was prepared with digital dictation and possible smart phrase technology. Any transcriptional errors that result from this process are unintentional.

## 2013-10-18 NOTE — Patient Instructions (Signed)
Continue clopidogrel 75 mg orally every day for secondary stroke prevention and maintain strict control of hypertension with blood pressure goal below 130/90, lipids with LDL cholesterol goal below 100 mg/dL.  Continue Keppra 1 g mg BID for seizure.  Return for follow up appt in 6 months, sooner as needed.

## 2013-11-08 ENCOUNTER — Encounter: Payer: Self-pay | Admitting: Nurse Practitioner

## 2013-11-09 ENCOUNTER — Other Ambulatory Visit: Payer: Self-pay | Admitting: *Deleted

## 2013-11-09 MED ORDER — OXYCODONE-ACETAMINOPHEN 10-325 MG PO TABS: ORAL_TABLET | ORAL | Status: DC

## 2013-11-09 NOTE — Telephone Encounter (Signed)
Rx ready for pick up and patient is aware 

## 2014-01-11 ENCOUNTER — Other Ambulatory Visit: Payer: Self-pay | Admitting: *Deleted

## 2014-01-11 MED ORDER — OXYCODONE-ACETAMINOPHEN 10-325 MG PO TABS: ORAL_TABLET | ORAL | Status: DC

## 2014-01-11 NOTE — Telephone Encounter (Signed)
Rx ready for pick up and patient is aware 

## 2014-02-24 ENCOUNTER — Other Ambulatory Visit: Payer: Self-pay | Admitting: Family Medicine

## 2014-02-28 ENCOUNTER — Ambulatory Visit (INDEPENDENT_AMBULATORY_CARE_PROVIDER_SITE_OTHER): Payer: Medicare Other | Admitting: Family Medicine

## 2014-02-28 ENCOUNTER — Encounter: Payer: Self-pay | Admitting: Family Medicine

## 2014-02-28 VITALS — BP 120/80 | Temp 98.8°F | Wt 155.0 lb

## 2014-02-28 DIAGNOSIS — G8320 Monoplegia of upper limb affecting unspecified side: Secondary | ICD-10-CM

## 2014-02-28 DIAGNOSIS — F411 Generalized anxiety disorder: Secondary | ICD-10-CM

## 2014-02-28 DIAGNOSIS — S8490XA Injury of unspecified nerve at lower leg level, unspecified leg, initial encounter: Secondary | ICD-10-CM

## 2014-02-28 DIAGNOSIS — N390 Urinary tract infection, site not specified: Secondary | ICD-10-CM

## 2014-02-28 DIAGNOSIS — M62838 Other muscle spasm: Secondary | ICD-10-CM

## 2014-02-28 MED ORDER — DIAZEPAM 5 MG PO TABS: 5.0000 mg | ORAL_TABLET | Freq: Every day | ORAL | Status: DC

## 2014-02-28 MED ORDER — SULFAMETHOXAZOLE-TMP DS 800-160 MG PO TABS: 1.0000 | ORAL_TABLET | Freq: Two times a day (BID) | ORAL | Status: DC

## 2014-02-28 MED ORDER — OXYCODONE-ACETAMINOPHEN 10-325 MG PO TABS: ORAL_TABLET | ORAL | Status: DC

## 2014-02-28 NOTE — Progress Notes (Signed)
Pre visit review using our clinic review tool, if applicable. No additional management support is needed unless otherwise documented below in the visit note. 

## 2014-02-28 NOTE — Progress Notes (Signed)
   Subjective:    Patient ID: Stacy Diaz, female    DOB: February 08, 1951, 63 y.o.   MRN: [id number]  HPI Stacy Diaz is a 63 year old married female nonsmoker who comes in today for evaluation of blood in her urine  She's noticed painless hematuria now for about 2 weeks. She's had no fever chills or back pain.  She did have a brain tumor Stacy Diaz many years which left her wheelchair-bound and she's had recurrent urinary tract infections.   Review of Systems Review of systems otherwise negative    Objective:   Physical Exam  Well-developed well-nourished female no acute distress vital signs stable she is afebrile  Because of her weight we were not able to get her on the exam room table. She has a chair that declines.  Her abdomen is negative visual inspection of the external genitalis within normal limits no redness irritation except for normal post menopausal vaginal dryness      Assessment & Plan:  Lower urinary tract infection................ cover with antibiotics GYN consult if symptoms persist.

## 2014-02-28 NOTE — Patient Instructions (Addendum)
Septra DS......... one twice daily for 10 days........ if symptoms persist recommend a urologic consult........Marland Kitchen Dr. Gray Bernhardt

## 2014-03-20 ENCOUNTER — Telehealth: Payer: Self-pay | Admitting: Family Medicine

## 2014-03-20 DIAGNOSIS — M62838 Other muscle spasm: Secondary | ICD-10-CM

## 2014-03-20 NOTE — Telephone Encounter (Signed)
[facility] [place] is requesting re-fills on the following:  baclofen (LIORESAL) 20 MG tablet levETIRAcetam (KEPPRA) 1000 MG tablet

## 2014-03-22 MED ORDER — LEVETIRACETAM 1000 MG PO TABS: 1000.0000 mg | ORAL_TABLET | Freq: Two times a day (BID) | ORAL | Status: DC

## 2014-03-22 MED ORDER — BACLOFEN 20 MG PO TABS: 20.0000 mg | ORAL_TABLET | Freq: Four times a day (QID) | ORAL | Status: DC

## 2014-03-22 NOTE — Telephone Encounter (Signed)
Rx sent 

## 2014-03-26 ENCOUNTER — Telehealth: Payer: Self-pay | Admitting: Family Medicine

## 2014-03-26 MED ORDER — LORAZEPAM 1 MG PO TABS: 1.0000 mg | ORAL_TABLET | Freq: Two times a day (BID) | ORAL | Status: DC | PRN

## 2014-03-26 NOTE — Telephone Encounter (Signed)
[place], [place] is requesting re-fill on LORazepam (ATIVAN) 1 MG tablet

## 2014-03-26 NOTE — Telephone Encounter (Signed)
Rx called in 

## 2014-04-06 ENCOUNTER — Telehealth: Payer: Self-pay | Admitting: Family Medicine

## 2014-04-06 DIAGNOSIS — G8320 Monoplegia of upper limb affecting unspecified side: Secondary | ICD-10-CM

## 2014-04-06 NOTE — Telephone Encounter (Signed)
Pt req rx on oxyCODONE-acetaminophen (PERCOCET) 10-325 MG per tablet   90 day supply    methocarbamol (ROBAXIN) 500 MG tablet   90 day supply

## 2014-04-10 MED ORDER — OXYCODONE-ACETAMINOPHEN 10-325 MG PO TABS: ORAL_TABLET | ORAL | Status: DC

## 2014-04-10 MED ORDER — METHOCARBAMOL 500 MG PO TABS: 500.0000 mg | ORAL_TABLET | Freq: Three times a day (TID) | ORAL | Status: DC

## 2014-04-10 NOTE — Telephone Encounter (Signed)
Pt is following up on her rx,  Pt states if needed please call her on [phone]

## 2014-04-10 NOTE — Telephone Encounter (Signed)
Rx ready for pick up and patient is aware 

## 2014-04-21 ENCOUNTER — Other Ambulatory Visit: Payer: Self-pay | Admitting: Family Medicine

## 2014-04-27 ENCOUNTER — Ambulatory Visit (INDEPENDENT_AMBULATORY_CARE_PROVIDER_SITE_OTHER): Payer: Medicare Other | Admitting: Nurse Practitioner

## 2014-04-27 ENCOUNTER — Encounter: Payer: Self-pay | Admitting: Nurse Practitioner

## 2014-04-27 VITALS — BP 134/80 | HR 70

## 2014-04-27 DIAGNOSIS — R4182 Altered mental status, unspecified: Secondary | ICD-10-CM

## 2014-04-27 DIAGNOSIS — G40209 Localization-related (focal) (partial) symptomatic epilepsy and epileptic syndromes with complex partial seizures, not intractable, without status epilepticus: Secondary | ICD-10-CM

## 2014-04-27 DIAGNOSIS — Z8673 Personal history of transient ischemic attack (TIA), and cerebral infarction without residual deficits: Secondary | ICD-10-CM

## 2014-04-27 NOTE — Progress Notes (Signed)
PATIENT: Stacy Diaz DOB: 09-24-1950  REASON FOR VISIT: routine follow up for stroke, partial seizures HISTORY FROM: patient  HISTORY OF PRESENT ILLNESS: Stacy Diaz is a 63 year old female with history of previous meningioma resection with subsequent spastic paraparesis who developed left arm weakness and facial droop on the morning of 08/25/2012. She had multiple episodes in the past similar to the episode above but this one had been persistent. MRI was not an option since she had history of no clips to 2 meningioma resection. Since her surgery she has had trouble with weakness from waist down. At her baseline she had trouble angulating for the past 4 years but could bear weight on her legs but needed help with transfers. Lately her legs have been nonweight bearing at all.  She was admitted to [facility] for workup and CT was negative for acute infarct. She was discharged to [facility] after 3 days. On the third day there her symptoms became worse and she was readmitted to the hospital. On the second day of her second hospitalization she had a seizure that was witnessed and included loss of consciousness with a blank stare and lasted approximately 45 minutes. She suffered another seizure that was shorter in length approximately 10 minutes on the following day. She has not had any known recurrent seizures since that time. She was discharged on Keppra 750 mg twice a day and continues with that dose without any side effects.  She was discharged on Plavix 75 mg daily and continues with that dose. Patient denies medication side effects, with no signs of bleeding. Patient is in outpatient therapy.   Update 07/11/13 (PS): She is seen for her first office followup visit following hospital admission on 05/31/39 and with sudden onset of left facial droop and left upper weakness and numbness. She started improving by the time EMS came and she was back to nearly baseline within 1 hour. CT scan of  the head showed no acute abnormality. MRI could not be done because she has a metal surgical plate following meningioma resection surgery 10 years ago. She had EEG which was unremarkable. She was previously on Keppra 750 twice daily the dose of which was increased to 1 g twice daily. She states she has finished outpatient physical and occupational therapy and is almost 90% back to her baseline. She has spastic paraplegia following meningioma surgery 10 years ago and her similar episode left face and arm weakness possibly right brain stroke in January 2014. She is tolerating increase to Keppra but does complain of mild tiredness which seemed to be improving but is not back to her baseline. She is also tolerating Plavix without bleeding, bruising or other side effects. She states her blood pressure is well controlled, slightly elevated at 139/76 today. She had EEG done on 01/05/13 which showed no evidence of seizure activity.   UPDATE 10/18/13 (LL): She returns to the office for follow up, she reports she is doing well, back to her baseline. Gets very tired by the end of the day and due to core muscle weakness she is often leaning to the right in her wheelchair by the late afternoon. She has a new aide at home who is starting to help her do crunches and resistance band exercises. She is tolerating Keppra well with no recurrent partial seizures. Continues on Plavix, patient denies medication side effects, with no signs of bleeding.   UPDATE 04/27/14 (LL): She returns for stroke followup, has been well except  recently had a spell where she felt strange, called for her husband and he found her slumped in her chair and "out of it." They do not think she lost consciousness, and episode lasted less than 10 minutes and she was back to normal. She states that when she is hungry, she sometimes starts to feel poorly, anxious, and shaky and wonders if her blood sugar is too low. The feeling disappears when she eats. They do not  have a blood glucose meter. She has been compliant with taking her Keppra and all other medications. Her blood pressure is well controlled, it is 134/80 in the office today. She is tolerating Plavix well with no signs of significant bleeding or bruising.  REVIEW OF SYSTEMS: Full 14 system review of systems performed and notable only for gastrointestinal: Incontinence, constipation musculoskeletal: Joint pain, cramps, aching muscles,back pain, walking difficulty, coordination problems genitourinary: Incontinence,  neurological: weakness, seizure, numbness sleep: frequent waking psychiatric: anxiety  ALLERGIES: No Known Allergies  HOME MEDICATIONS: Outpatient Prescriptions Prior to Visit  Medication Sig Dispense Refill  . atenolol-chlorthalidone (TENORETIC) 50-25 MG per tablet Take 1 tablet by mouth daily.  100 tablet  3  . atorvastatin (LIPITOR) 10 MG tablet Take 1 tablet (10 mg total) by mouth daily.  100 tablet  3  . baclofen (LIORESAL) 20 MG tablet Take 1 tablet (20 mg total) by mouth 4 (four) times daily.  360 each  3  . clopidogrel (PLAVIX) 75 MG tablet Take 1 tablet (75 mg total) by mouth daily with breakfast.  100 tablet  3  . diazepam (VALIUM) 5 MG tablet Take 1 tablet (5 mg total) by mouth at bedtime.  90 tablet  3  . Docusate Sodium (DSS) 100 MG CAPS Take 100 mg by mouth 2 (two) times daily.      Marland Kitchen KLOR-CON M10 10 MEQ tablet TAKE 1 TABLET BY MOUTH TWICE DAILY  180 tablet  2  . levETIRAcetam (KEPPRA) 1000 MG tablet Take 1 tablet (1,000 mg total) by mouth 2 (two) times daily.  180 tablet  3  . lidocaine (LIDODERM) 5 % Place 1 patch onto the skin daily as needed (usually twice weekly, only as needed). Remove & Discard patch within 12 hours or as directed by MD      . LORazepam (ATIVAN) 1 MG tablet Take 1 tablet (1 mg total) by mouth 2 (two) times daily as needed for anxiety.  180 tablet  1  . Melatonin 3 MG TABS Take 1 tablet (3 mg total) by mouth at bedtime.  90 tablet  3  . methocarbamol  (ROBAXIN) 500 MG tablet Take 1 tablet (500 mg total) by mouth 3 (three) times daily.  300 tablet  3  . oxyCODONE-acetaminophen (PERCOCET) 10-325 MG per tablet 1 by mouth 3 times a day  180 tablet  0  . polyethylene glycol (MIRALAX / GLYCOLAX) packet Take 17 g by mouth daily as needed (for constipation).      . zinc oxide (BALMEX) 11.3 % CREA cream Apply 1 application topically 3 (three) times daily.      Marland Kitchen sulfamethoxazole-trimethoprim (BACTRIM DS) 800-160 MG per tablet Take 1 tablet by mouth 2 (two) times daily.  20 tablet  1   No facility-administered medications prior to visit.    PHYSICAL EXAM Filed Vitals:   04/27/14 1338  BP: 134/80  Pulse: 70    GENERAL EXAM: Patient is in no distress, pleasant, wheelchair bound  HEAD: Symmetric facial features.  EARS, NOSE, and THROAT: Normal.  NECK: Supple, no JVD  RESPIRATORY: Lungs CTA.  CARDIOVASCULAR: Regular rate and rhythm, no murmurs, no carotid bruits  SKIN: No rash, no bruising   NEUROLOGIC:  MENTAL STATUS: awake, alert and oriented to person, place and time, language fluent, comprehension intact, naming intact  CRANIAL NERVE: pupils equal and reactive to light, visual fields full to confrontation, mild facial asymmetry on the left, uvula midline, shoulder shrug symmetric, tongue midline.  MOTOR: Significant weakness in right and left lower extremities with only 2/5 strength hip extension and 0/5 at the hips ankles and toes. Tone is increase in the both lower extremities, 4/5 strength left upper extremity, fine finger movements decreased on left  SENSORY: normal and symmetric to light touch  COORDINATION: finger-nose-finger with mild ataxia on left, normal on right  REFLEXES: deep tendon reflexes BUE present and symmetric 2+, BLE not tested.  GAIT/STATION: Wheelchair-bound.   ASSESSMENT: Stacy Diaz is a 63 year old female with history of previous meningioma resection with subsequent spastic paraparesis who developed left  arm weakness and facial droop on the morning of 08/25/2012. Felt to be stroke although never confirmed on CT. She was discharged on Plavix 75 mg daily. She experienced 2 partial complex seizures while in the hospital and was discharged on Keppra 750 mg twice a day. She has had a recurrent episode of left face and upper extremity weakness and numbness possible right brain stroke versus seizure in October 2014. Keppra was increased to 1000 mg bid.  Recent spell of brief altered mentation, possible partial seizure or hypoglycemia.  Do not think it necessary to add another anticonvulsant unless more episodes occur.  PLAN:  Continue clopidogrel 75 mg orally every day for secondary stroke prevention and maintain strict control of hypertension with blood pressure goal below 130/90, lipids with LDL cholesterol goal below 100 mg/dL.  Continue Keppra 1000 mg mg BID for seizures. If she has more episodes or seizures, she is to contact the office.  She is advised to eat smaller more frequent meals to prevent hypoglycemia throughout the day. Return for follow up appt in 6 months with Dr. Leonie Man, sooner as needed.  Rudi Rummage LAM, MSN, FNP-BC, A/GNP-C 04/27/2014, 1:49 PM [facility] [address] [place], [place] [address] [phone]  Note: This document was prepared with digital dictation and possible smart phrase technology. Any transcriptional errors that result from this process are unintentional.

## 2014-04-27 NOTE — Patient Instructions (Addendum)
Continue clopidogrel 75 mg orally every day for secondary stroke prevention and maintain strict control of hypertension with blood pressure goal below 130/90, lipids with LDL cholesterol goal below 100 mg/dL.  Continue Keppra 1000 mg mg BID for seizures. If she has more episodes or seizures, she is to contact the office.  She is advised to eat smaller more frequent meals to prevent hypoglycemia throughout the day. Return for follow up appt in 6 months with Dr. Leonie Man, sooner as needed.

## 2014-04-29 DIAGNOSIS — Z8673 Personal history of transient ischemic attack (TIA), and cerebral infarction without residual deficits: Secondary | ICD-10-CM | POA: Insufficient documentation

## 2014-04-29 DIAGNOSIS — G40209 Localization-related (focal) (partial) symptomatic epilepsy and epileptic syndromes with complex partial seizures, not intractable, without status epilepticus: Secondary | ICD-10-CM | POA: Insufficient documentation

## 2014-04-29 NOTE — Progress Notes (Signed)
I agree with the above plan 

## 2014-05-11 ENCOUNTER — Telehealth: Payer: Self-pay | Admitting: Nurse Practitioner

## 2014-05-11 DIAGNOSIS — G8320 Monoplegia of upper limb affecting unspecified side: Secondary | ICD-10-CM

## 2014-05-11 DIAGNOSIS — G40209 Localization-related (focal) (partial) symptomatic epilepsy and epileptic syndromes with complex partial seizures, not intractable, without status epilepticus: Secondary | ICD-10-CM

## 2014-05-11 MED ORDER — LACOSAMIDE 50 MG PO TABS: 50.0000 mg | ORAL_TABLET | Freq: Two times a day (BID) | ORAL | Status: DC

## 2014-05-11 NOTE — Telephone Encounter (Signed)
Spoke with patient's husband, seizure was typical of all past seizures.  Left hand was paralyzed for 30 miinutes but back to 80% movement at time of phone call.  Discussed case with Dr. Erlinda Hong who recommended loading dose of Vimpat today and 50 mg bid every day thereafter.  Asked Stacy Diaz, husband, to call the office if any changes in her condition, warned that she may become very sleepy after 200 mg loading dose.  He acknowledged instructions and was in agreement with plan.

## 2014-05-11 NOTE — Telephone Encounter (Signed)
Patient's husband calling to state that patient had another seizure this morning and is currently unable to move her left hand, please call and advise.

## 2014-05-12 ENCOUNTER — Other Ambulatory Visit: Payer: Self-pay | Admitting: Family Medicine

## 2014-05-21 LAB — HM MAMMOGRAPHY: HM MAMMO: ABNORMAL

## 2014-05-22 ENCOUNTER — Encounter: Payer: Self-pay | Admitting: Family Medicine

## 2014-05-23 ENCOUNTER — Other Ambulatory Visit: Payer: Self-pay | Admitting: Radiology

## 2014-05-25 ENCOUNTER — Other Ambulatory Visit: Payer: Self-pay | Admitting: *Deleted

## 2014-05-25 ENCOUNTER — Encounter: Payer: Self-pay | Admitting: *Deleted

## 2014-05-25 DIAGNOSIS — C50512 Malignant neoplasm of lower-outer quadrant of left female breast: Secondary | ICD-10-CM | POA: Insufficient documentation

## 2014-05-25 HISTORY — DX: Malignant neoplasm of lower-outer quadrant of left female breast: C50.512

## 2014-05-25 NOTE — Progress Notes (Signed)
Confirmed [facility] for 05/30/14 at 1200 .  Instructions and contact information given.

## 2014-05-30 ENCOUNTER — Ambulatory Visit
Admission: RE | Admit: 2014-05-30 | Discharge: 2014-05-30 | Disposition: A | Discharge status: Home / Self Care | Payer: Medicare Other | Source: Ambulatory Visit | Attending: Radiation Oncology | Admitting: Radiation Oncology

## 2014-05-30 ENCOUNTER — Ambulatory Visit: Payer: Medicare Other | Attending: General Surgery | Admitting: Physical Therapy

## 2014-05-30 ENCOUNTER — Ambulatory Visit (HOSPITAL_BASED_OUTPATIENT_CLINIC_OR_DEPARTMENT_OTHER): Payer: Medicare Other | Admitting: Hematology and Oncology

## 2014-05-30 ENCOUNTER — Encounter: Payer: Self-pay | Admitting: Radiation Oncology

## 2014-05-30 ENCOUNTER — Other Ambulatory Visit (HOSPITAL_BASED_OUTPATIENT_CLINIC_OR_DEPARTMENT_OTHER): Payer: Medicare Other

## 2014-05-30 ENCOUNTER — Encounter: Payer: Self-pay | Admitting: Hematology and Oncology

## 2014-05-30 ENCOUNTER — Other Ambulatory Visit (INDEPENDENT_AMBULATORY_CARE_PROVIDER_SITE_OTHER): Payer: Self-pay | Admitting: General Surgery

## 2014-05-30 ENCOUNTER — Ambulatory Visit: Payer: Medicare Other

## 2014-05-30 VITALS — BP 156/78 | HR 62 | Temp 98.0°F | Resp 20

## 2014-05-30 DIAGNOSIS — C50912 Malignant neoplasm of unspecified site of left female breast: Secondary | ICD-10-CM

## 2014-05-30 DIAGNOSIS — I252 Old myocardial infarction: Secondary | ICD-10-CM | POA: Diagnosis not present

## 2014-05-30 DIAGNOSIS — Z8673 Personal history of transient ischemic attack (TIA), and cerebral infarction without residual deficits: Secondary | ICD-10-CM | POA: Insufficient documentation

## 2014-05-30 DIAGNOSIS — R208 Other disturbances of skin sensation: Secondary | ICD-10-CM | POA: Diagnosis not present

## 2014-05-30 DIAGNOSIS — C50512 Malignant neoplasm of lower-outer quadrant of left female breast: Secondary | ICD-10-CM

## 2014-05-30 DIAGNOSIS — Z803 Family history of malignant neoplasm of breast: Secondary | ICD-10-CM

## 2014-05-30 DIAGNOSIS — Z170 Estrogen receptor positive status [ER+]: Secondary | ICD-10-CM

## 2014-05-30 DIAGNOSIS — C50919 Malignant neoplasm of unspecified site of unspecified female breast: Secondary | ICD-10-CM | POA: Insufficient documentation

## 2014-05-30 DIAGNOSIS — M6281 Muscle weakness (generalized): Secondary | ICD-10-CM | POA: Insufficient documentation

## 2014-05-30 DIAGNOSIS — Z993 Dependence on wheelchair: Secondary | ICD-10-CM | POA: Diagnosis not present

## 2014-05-30 DIAGNOSIS — M2560 Stiffness of unspecified joint, not elsewhere classified: Secondary | ICD-10-CM | POA: Diagnosis not present

## 2014-05-30 DIAGNOSIS — R569 Unspecified convulsions: Secondary | ICD-10-CM | POA: Diagnosis not present

## 2014-05-30 DIAGNOSIS — Z5189 Encounter for other specified aftercare: Secondary | ICD-10-CM | POA: Insufficient documentation

## 2014-05-30 LAB — COMPREHENSIVE METABOLIC PANEL (CC13)
ALK PHOS: 75 U/L (ref 40–150)
ALT: 34 U/L (ref 0–55)
AST: 26 U/L (ref 5–34)
Albumin: 3.8 g/dL (ref 3.5–5.0)
Anion Gap: 10 mEq/L (ref 3–11)
BILIRUBIN TOTAL: 0.49 mg/dL (ref 0.20–1.20)
BUN: 14.6 mg/dL (ref 7.0–26.0)
CO2: 28 mEq/L (ref 22–29)
Calcium: 9.9 mg/dL (ref 8.4–10.4)
Chloride: 105 mEq/L (ref 98–109)
Creatinine: 0.7 mg/dL (ref 0.6–1.1)
GLUCOSE: 109 mg/dL (ref 70–140)
Potassium: 3.5 mEq/L (ref 3.5–5.1)
Sodium: 143 mEq/L (ref 136–145)
Total Protein: 7.1 g/dL (ref 6.4–8.3)

## 2014-05-30 LAB — CBC WITH DIFFERENTIAL/PLATELET
BASO%: 0.4 % (ref 0.0–2.0)
Basophils Absolute: 0 10*3/uL (ref 0.0–0.1)
EOS ABS: 0.1 10*3/uL (ref 0.0–0.5)
EOS%: 1.2 % (ref 0.0–7.0)
HCT: 44.9 % (ref 34.8–46.6)
HGB: 14.6 g/dL (ref 11.6–15.9)
LYMPH%: 39.2 % (ref 14.0–49.7)
MCH: 29.7 pg (ref 25.1–34.0)
MCHC: 32.5 g/dL (ref 31.5–36.0)
MCV: 91.2 fL (ref 79.5–101.0)
MONO#: 0.7 10*3/uL (ref 0.1–0.9)
MONO%: 9.7 % (ref 0.0–14.0)
NEUT%: 49.5 % (ref 38.4–76.8)
NEUTROS ABS: 3.6 10*3/uL (ref 1.5–6.5)
PLATELETS: 221 10*3/uL (ref 145–400)
RBC: 4.93 10*6/uL (ref 3.70–5.45)
RDW: 13.1 % (ref 11.2–14.5)
WBC: 7.3 10*3/uL (ref 3.9–10.3)
lymph#: 2.9 10*3/uL (ref 0.9–3.3)

## 2014-05-30 NOTE — Assessment & Plan Note (Signed)
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 N0 M0 stage IA clinical stage  Discussed with the patient, the details of pathology including the type of breast cancer,the clinical staging, the significance of ER, PR and HER-2/neu receptors and the implications for treatment. After reviewing the pathology in detail, we proceeded to discuss the different treatment options between surgery, radiation, chemotherapy, antiestrogen therapies.  Recommendation: After discussing her case in detail with Dr. Leonie Man, her neurologist, the recommendation was for her to undergo surgery with lumpectomy with sentinel lymph node study. This would be followed by Oncotype DX testing is not final pathology to determine whether or not she requires chemotherapy. If she does not need chemotherapy, she will undergo radiation followed by antiestrogen therapy with aromatase inhibitors. Given her extensive neurological issues, we may elect to hold off on chemotherapy and plan only antiestrogen therapy. Aromatase inhibitors certainly have a very small risk of stroke and 1-2%. But we feel that the benefits over weight the risks.  I will see her back after surgery to discuss further adjuvant treatment options.

## 2014-05-30 NOTE — Progress Notes (Signed)
Checked in new patient with no financial issues prior to seeing the dr. She has appt card and [facility] packet. She [place] not been [place] the [place] and I advised of alight fund

## 2014-05-30 NOTE — Progress Notes (Signed)
Radiation Oncology         ([phone]) [phone] ________________________________  Name: Stacy Diaz MRN: [id number]  Date: 05/30/2014  DOB: May 07, 1951  IF:OYDX,AJOINOM [name], MD  Jovita Kussmaul, MD     REFERRING PHYSICIAN: Jovita Kussmaul, MD   DIAGNOSIS: The encounter diagnosis was Breast cancer of lower-outer quadrant of left female breast.  Breast cancer of lower-outer quadrant of left female breast   Primary site: Breast (Left)   Staging method: AJCC 7th Edition   Clinical: Stage IA (T1c, N0, cM0)   Summary: Stage IA (T1c, N0, cM0)   Clinical comments: Staged at breast conference 05/30/14.    HISTORY OF PRESENT ILLNESS::Stacy Diaz is a 63 y.o. female who is seen for an initial consultation visit regarding the patient's diagnosis of breast cancer.  The patient was found to have suspicious findings within the left breast on initial mammogram. The patient has not had symptoms prior to this study. A diagnostic mammogram and breast ultrasound confirmed this finding. On ultrasound, the tumor measured 2.0 cm and was present in the lower outer quadrant.  A biopsy was performed. This revealed invasive mammary carcinoma with lobular features. Perineural invasion was also present. Receptors studies were completed and indicate that the tumor is estrogen receptor positive, progesterone receptor positive, and Her-2/neu negative. The Ki-67 staining was 25 %.   The patient has not undergone an MRI scan of the breasts: None ordered.    PREVIOUS RADIATION THERAPY: Yes the patient received postoperative radiation treatment through Dr. Danny Lawless after resection of a meningioma in approximately [place]:  has a past medical history of HTN (hypertension); Hyperlipidemia; Meningioma (1999); Myocardial infarction (1985?); Headache(784.0); Arthritis; Stroke (08/25/2012); Spastic paraparesis; Paresis of lower extremity (1999); Anxiety; Heart disease; Breast cancer of lower-outer  quadrant of left female breast (05/25/2014); and Seizures.     PAST SURGICAL HISTORY: Past Surgical History  Procedure Laterality Date  . Coronary angioplasty  1970's  . Cardiac catheterization  1970's  . Brain meningioma excision  1999     FAMILY HISTORY: family history includes Breast cancer in her mother and paternal grandmother; Heart disease in her father.   SOCIAL HISTORY:  reports that she has quit smoking. Her smoking use included Cigarettes. She has a 20 pack-year smoking history. She has never used smokeless tobacco. She reports that she drinks about 4.2 ounces of alcohol per week. She reports that she does not use illicit drugs.   ALLERGIES: Review of patient's allergies indicates no known allergies.   MEDICATIONS:  Current Outpatient Prescriptions  Medication Sig Dispense Refill  . atenolol-chlorthalidone (TENORETIC) 50-25 MG per tablet TAKE 1 TABLET BY MOUTH DAILY  90 tablet  2  . atorvastatin (LIPITOR) 10 MG tablet Take 1 tablet (10 mg total) by mouth daily.  100 tablet  3  . baclofen (LIORESAL) 20 MG tablet Take 1 tablet (20 mg total) by mouth 4 (four) times daily.  360 each  3  . clopidogrel (PLAVIX) 75 MG tablet Take 1 tablet (75 mg total) by mouth daily with breakfast.  100 tablet  3  . diazepam (VALIUM) 5 MG tablet Take 1 tablet (5 mg total) by mouth at bedtime.  90 tablet  3  . Docusate Sodium (DSS) 100 MG CAPS Take 100 mg by mouth 2 (two) times daily.      Marland Kitchen KLOR-CON M10 10 MEQ tablet TAKE 1 TABLET BY MOUTH TWICE DAILY  180 tablet  2  . lacosamide (VIMPAT) 50 MG  TABS tablet Take 1 tablet (50 mg total) by mouth 2 (two) times daily. Start with 200 mg loading dose 1st day.  65 tablet  5  . levETIRAcetam (KEPPRA) 1000 MG tablet Take 1 tablet (1,000 mg total) by mouth 2 (two) times daily.  180 tablet  3  . lidocaine (LIDODERM) 5 % Place 1 patch onto the skin daily as needed (usually twice weekly, only as needed). Remove & Discard patch within 12 hours or as directed by  MD      . LORazepam (ATIVAN) 1 MG tablet Take 1 tablet (1 mg total) by mouth 2 (two) times daily as needed for anxiety.  180 tablet  1  . Melatonin 3 MG TABS Take 1 tablet (3 mg total) by mouth at bedtime.  90 tablet  3  . methocarbamol (ROBAXIN) 500 MG tablet Take 1 tablet (500 mg total) by mouth 3 (three) times daily.  300 tablet  3  . oxyCODONE-acetaminophen (PERCOCET) 10-325 MG per tablet 1 by mouth 3 times a day  180 tablet  0  . polyethylene glycol (MIRALAX / GLYCOLAX) packet Take 17 g by mouth daily as needed (for constipation).      . zinc oxide (BALMEX) 11.3 % CREA cream Apply 1 application topically 3 (three) times daily.       No current facility-administered medications for this encounter.     REVIEW OF SYSTEMS:  A 15 point review of systems is documented in the electronic medical record. This was obtained by the nursing staff. However, I reviewed this with the patient to discuss relevant findings and make appropriate changes.  Pertinent items are noted in HPI.    PHYSICAL EXAM:  vitals were not taken for this visit.  ECOG = 0  0 - Asymptomatic (Fully active, able to carry on all predisease activities without restriction)  1 - Symptomatic but completely ambulatory (Restricted in physically strenuous activity but ambulatory and able to carry out work of a light or sedentary nature. For example, light housework, office work)  2 - Symptomatic, <50% in bed during the day (Ambulatory and capable of all self care but unable to carry out any work activities. Up and about more than 50% of waking hours)  3 - Symptomatic, >50% in bed, but not bedbound (Capable of only limited self-care, confined to bed or chair 50% or more of waking hours)  4 - Bedbound (Completely disabled. Cannot carry on any self-care. Totally confined to bed or chair)  5 - Death   Eustace Pen MM, Creech RH, Tormey DC, et al. [phone]). "Toxicity and response criteria of the [facility]". [place]  Oncol. 5 (6): 649-55  General: Well-developed, in no acute distress, sitting in a wheelchair     LABORATORY DATA:  Lab Results  Component Value Date   WBC 7.3 05/30/2014   HGB 14.6 05/30/2014   HCT 44.9 05/30/2014   MCV 91.2 05/30/2014   PLT 221 05/30/2014   Lab Results  Component Value Date   NA 143 05/30/2014   K 3.5 05/30/2014   CL 103 07/17/2013   CO2 28 05/30/2014   Lab Results  Component Value Date   ALT 34 05/30/2014   AST 26 05/30/2014   ALKPHOS 75 05/30/2014   BILITOT 0.49 05/30/2014      RADIOGRAPHY: No results found.     IMPRESSION:    Breast cancer of lower-outer quadrant of left female breast   05/21/2014 Mammogram Mammogram and Ultrasound: Left breast 2 cm  complex cystic asymmetric abnormality   05/25/2014 Initial Diagnosis Invasive mammary cancer with lobular features with perineural invasion, grade 2, ER positive PR positive HER-2 negative, Ki-67 25%    The patient has a recent diagnosis of invasive mammary carcinoma of the left breast. She appears to be a good candidate for breast conservation treatment if she can undergo the surgery. The patient has significant comorbidities. She has no motor strength in terms of functional ability with the lower extremities. The patient also has continued to have transient ischemic attacks of unknown cause. Dr. Abigail Miyamoto is obtaining additional information to determine what her surgical options are at this time.  I discussed with the patient the  potential role of adjuvant radiation treatment in this setting. We discussed the potential benefit of radiation treatment, especially with regards to local control of the patient's tumor. We also discussed the possible side effects and risks of such a treatment as well. If she ultimately undergoes a lumpectomy, then I do believe that the patient would be able to proceed with adjuvant radiation treatment. She would potentially candidate for a hypo-fractionated, four-week course of  treatment. If she were to proceed with a mastectomy, then based on her current information she would not require adjuvant radiation treatment.  All of the patient's questions were answered.   PLAN:  Additional information is being obtained to determine what her surgical options are. If she proceeds with a lumpectomy, then I would see the patient postoperatively to further discuss and adjuvant course of radiation treatment. The patient is potentially a candidate for anti-hormonal treatment in the near future which could allow surgery to be delayed for some time.  I spent 30 minutes face to face with the patient and more than 50% of that time was spent in counseling and/or coordination of care.    ________________________________   Jodelle Gross, MD, PhD   **Disclaimer: This note was dictated with voice recognition software. Similar sounding words can inadvertently be transcribed and this note may contain transcription errors which may not have been corrected upon publication of note.**

## 2014-05-30 NOTE — H&P (Signed)
Stacy Diaz 05/30/2014 7:54 AM Location: [facility] Patient #: [id number] DOB: 05-28-1951 Undefined / Language: Stacy Diaz / Race: Undefined Female  History of Present Illness Sammuel Hines. Stacy Starks MD; 05/30/2014 3:18 PM) The patient is a 63 year old female who presents with breast cancer. We are asked to see the patient in consultation by Dr. Marcelo Baldy to evaluate her for a new left breast cancer. The patient is a 63 year old white female who recently went for a routine screening mammogram. At that time she was found to have an abnormality in the lower outer quadrant of the left breast. 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 It was ER and PR positive and HER-2/neu negative with a Ki-67 of 25%. It measured 2 cm by ultrasound. She denies any breast pain or discharge from her nipple. She does not take any female hormones. 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   Other Problems Stacy Diaz; 05/30/2014 7:54 AM) Back Pain Bladder Problems Cerebrovascular Accident High blood pressure Myocardial infarction Seizure Disorder  Past Surgical History Stacy Diaz; 05/30/2014 7:54 AM) Oral Surgery  Diagnostic Studies History Stacy Diaz; 05/30/2014 7:54 AM) Colonoscopy 1-5 years ago Mammogram within last year Pap Smear 1-5 years ago  Social History Stacy Diaz; 05/30/2014 7:54 AM) Alcohol use Occasional alcohol use. Caffeine use Carbonated beverages, Coffee. No drug use Tobacco use Former smoker.  Family History Stacy Diaz; 05/30/2014 7:54 AM) Breast Cancer Mother. Cerebrovascular Accident Father. Hypertension Father.  Pregnancy / Birth History Stacy Diaz; 05/30/2014 7:54 AM) Age of men[age] Contraceptive History Oral contraceptives. Gravida 2 Maternal age 49-35 Para 2  Review of Systems Stacy Diaz; 05/30/2014 7:54 AM) General Not Present- Appetite Loss, Chills, Fatigue, Fever, Night Sweats, Weight Gain and Weight Loss. Skin Not Present- Change in Wart/Mole, Dryness, Hives, Jaundice, New Lesions, Non-Healing Wounds, Rash and Ulcer. HEENT Present- Wears glasses/contact lenses. Not Present- Earache, Hearing Loss, Hoarseness, Nose Bleed, Oral Ulcers, Ringing in the Ears, Seasonal Allergies, Sinus Pain, Sore Throat, Visual Disturbances and Yellow Eyes. Respiratory Present- Snoring. Not Present- Bloody sputum, Chronic Cough, Difficulty Breathing and Wheezing. Breast Not Present- Breast Mass, Breast Pain, Nipple Discharge and Skin Changes. Cardiovascular Present- Rapid Heart Rate. Not Present- Chest Pain, Difficulty Breathing Lying Down, Leg Cramps, Palpitations, Shortness of Breath and Swelling of Extremities. Gastrointestinal Present- Constipation. Not Present- Abdominal Pain, Bloating, Bloody Stool, Change in Bowel Habits, Chronic diarrhea, Difficulty Swallowing, Excessive gas, Gets full quickly at meals, Hemorrhoids, Indigestion, Nausea, Rectal Pain and Vomiting. Musculoskeletal Present- Muscle Pain and Muscle Weakness. Not Present- Back Pain, Joint Pain, Joint Stiffness and Swelling of Extremities. Neurological Present- Seizures. Not Present- Decreased Memory, Fainting, Headaches, Numbness, Tingling, Tremor, Trouble walking and Weakness. Psychiatric Not Present- Anxiety, Bipolar, Change in Sleep Pattern, Depression, Fearful and Frequent crying. Endocrine Not Present- Cold Intolerance, Excessive Hunger, Hair Changes, Heat Intolerance, Hot flashes and New Diabetes. Hematology Present- Easy Bruising. Not Present- Excessive bleeding, Gland problems, HIV and Persistent Infections.   Physical Exam Stacy Diaz S. Stacy Starks MD; 05/30/2014 3:19 PM) General Mental Status-Alert. General Appearance-Consistent with stated age. Hydration-Well hydrated. Voice-Normal.  Head and  Neck Head-normocephalic, atraumatic with no lesions or palpable masses. Trachea-midline. Thyroid Gland Characteristics - normal size and consistency.  Eye Eyeball - Bilateral-Extraocular movements intact. Sclera/Conjunctiva - Bilateral-No scleral icterus.  Chest and Lung Exam Chest and lung exam reveals -quiet, even and easy respiratory effort  with no use of accessory muscles and on auscultation, normal breath sounds, no adventitious sounds and normal vocal resonance. Inspection Chest Wall - Normal. Back - normal.  Breast Note: There is no palpable mass in either breast. There is no palpable axillary, supraclavicular, or cervical lymphadenopathy.   Cardiovascular Cardiovascular examination reveals -normal heart sounds, regular rate and rhythm with no murmurs and normal pedal pulses bilaterally.  Abdomen Inspection Inspection of the abdomen reveals - No Hernias. Skin - Scar - no surgical scars. Palpation/Percussion Palpation and Percussion of the abdomen reveal - Soft, Non Tender, No Rebound tenderness, No Rigidity (guarding) and No hepatosplenomegaly. Auscultation Auscultation of the abdomen reveals - Bowel sounds normal.  Neurologic Neurologic evaluation reveals -alert and oriented x 3 with no impairment of recent or remote memory. Mental Status-Normal.  Musculoskeletal Normal Exam - Left-Upper Extremity Strength Normal and Lower Extremity Strength Normal. Normal Exam - Right-Upper Extremity Strength Normal and Lower Extremity Strength Normal.  Lymphatic Head & Neck  General Head & Neck Lymphatics: Bilateral - Description - Normal. Axillary  General Axillary Region: Bilateral - Description - Normal. Tenderness - Non Tender. Femoral & Inguinal  Generalized Femoral & Inguinal Lymphatics: Bilateral - Description - Normal. Tenderness - Non Tender.    Assessment & Plan Stacy Diaz S. Stacy Starks MD; 05/30/2014 3:21 PM) BREAST CANCER, LEFT (174.9   C50.912) MALIGNANT NEOPLASM OF LOWER-OUTER QUADRANT OF LEFT FEMALE BREAST (174.5  C50.512) Impression: The patient has a new stage I breast cancer in the lower outer quadrant. She is at risk of potential stroke given her history but we have contacted her neurologist who has given her clearance for surgery. I have talked her in detail about the different options for treatment and at this point she favors breast conservation and sentinel node mapping. I've discussed with her in detail the risks and benefits of the operation to do this as well as some of the technical aspects and she understands and wishes to proceed. She will need to be off her Plavix for approximately 5 days prior to surgery     Signed by Luella Cook, MD (05/30/2014 3:22 PM)

## 2014-05-30 NOTE — Progress Notes (Signed)
[facility] NOTE  Patient Care Team: Dorena Cookey, MD as PCP - Grant Town III, MD as Consulting Physician (General Surgery) Rulon Eisenmenger, MD as Consulting Physician (Hematology and Oncology) Marye Round, MD as Consulting Physician (Radiation Oncology)  CHIEF COMPLAINTS/PURPOSE OF CONSULTATION:  Newly diagnosed breast cancer  HISTORY OF PRESENTING ILLNESS:  Stacy Diaz 63 y.o. female is here because of recent diagnosis of left breast cancer. Patient has extensive family history of breast cancer with her mother at age 43 grandmother age 79  with breast cancer. She has a personal history of meningioma and more in October 1998. 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 Over the last 3 years patient has had left upper extremity weakness that would last for one to 2 hours and it would go away. These symptoms were felt to be related to TIAs. But in January 2014, patient developed left hemiparesis and was admitted to the hospital and intubated. Following recovery from this she had episodes of what appear to be seizures. She has been on antiseizure medication with decent control. Last episode of neurological change was a month and half ago when she had mild weakness of the left upper extremity. It appears to have resolved spontaneously as well. She requires a wheelchair for ambulation and she is here today accompanied by her husband.  She was found on a regular screening mammogram to have an abnormality in the left breast measuring 2 cm by ultrasound in addition to that a 6 mm complex cystic abnormality that was adjacent to it. A biopsy was consistent with invasive mammary cancer with lobular features with associated perineural invasion. Was ER/PR positive HER-2 negative and it was grade 2 with a Ki-67 25%.  I reviewed her records extensively and collaborated the history with the patient.  SUMMARY OF ONCOLOGIC HISTORY:   Breast cancer  of lower-outer quadrant of left female breast   05/21/2014 Mammogram Mammogram and Ultrasound: Left breast 2 cm complex cystic asymmetric abnormality   05/25/2014 Initial Diagnosis Invasive mammary cancer with lobular features with perineural invasion, grade 2, ER positive PR positive HER-2 negative, Ki-67 25%    In terms of breast cancer risk profile:  She menarched at early age of  75 and went to menopause at age  56  She had  2  pregnancy, her first child was born at age  36   She has  received birth control pills for approximately 14 years .  She was never exposed to fertility medications or hormone replacement therapy.  She has family history of Breast/GYN/GI cancer Her mother at age 45 of breast cancer metastasized. Grandmother age 24 on father's side had breast cancer.   MEDICAL HISTORY:  Past Medical History  Diagnosis Date  . HTN (hypertension)   . Hyperlipidemia   . Meningioma 1999  . Myocardial infarction 1985?  Marland Kitchen Headache(784.0)     "not real frequent" (08/25/2012)  . Arthritis     "probably" (08/25/2012)  . Stroke 08/25/2012    "that's what they think I've had; sudden LUE weakness" (08/25/2012)  . Spastic paraparesis     S/P meningioma resection 1999/notes 08/25/2012  . Paresis of lower extremity 1999    BLE/notes 08/25/2012  . Anxiety   . Heart disease   . Breast cancer of lower-outer quadrant of left female breast 05/25/2014  . Seizures     SURGICAL HISTORY: Past Surgical History  Procedure Laterality Date  . Coronary angioplasty  1970's  .  Cardiac catheterization  1970's  . Brain meningioma excision  1999    SOCIAL HISTORY: History   Social History  . Marital Status: Married    Spouse Name: david    Number of Children: 2  . Years of Education: college   Occupational History  . disabled    Social History Main Topics  . Smoking status: Former Smoker -- 2.00 packs/day for 10 years    Types: Cigarettes  . Smokeless tobacco: Never Used     Comment: 08/25/2012  "quit smoking cigarettes in 1985 when I had my heart attack"  . Alcohol Use: 4.2 oz/week    7 Glasses of wine per week     Comment: 08/25/2012 "glass of wine qd"  . Drug Use: No  . Sexual Activity: No   Other Topics Concern  . Not on file   Social History Narrative  . No narrative on file    FAMILY HISTORY: Family History  Problem Relation Age of Onset  . Breast cancer Mother   . Heart disease Father   . Breast cancer Paternal Grandmother     ALLERGIES:  has No Known Allergies.  MEDICATIONS:  Current Outpatient Prescriptions  Medication Sig Dispense Refill  . atenolol-chlorthalidone (TENORETIC) 50-25 MG per tablet TAKE 1 TABLET BY MOUTH DAILY  90 tablet  2  . atorvastatin (LIPITOR) 10 MG tablet Take 1 tablet (10 mg total) by mouth daily.  100 tablet  3  . baclofen (LIORESAL) 20 MG tablet Take 1 tablet (20 mg total) by mouth 4 (four) times daily.  360 each  3  . clopidogrel (PLAVIX) 75 MG tablet Take 1 tablet (75 mg total) by mouth daily with breakfast.  100 tablet  3  . diazepam (VALIUM) 5 MG tablet Take 1 tablet (5 mg total) by mouth at bedtime.  90 tablet  3  . Docusate Sodium (DSS) 100 MG CAPS Take 100 mg by mouth 2 (two) times daily.      Marland Kitchen KLOR-CON M10 10 MEQ tablet TAKE 1 TABLET BY MOUTH TWICE DAILY  180 tablet  2  . lacosamide (VIMPAT) 50 MG TABS tablet Take 1 tablet (50 mg total) by mouth 2 (two) times daily. Start with 200 mg loading dose 1st day.  65 tablet  5  . lidocaine (LIDODERM) 5 % Place 1 patch onto the skin daily as needed (usually twice weekly, only as needed). Remove & Discard patch within 12 hours or as directed by MD      . LORazepam (ATIVAN) 1 MG tablet Take 1 tablet (1 mg total) by mouth 2 (two) times daily as needed for anxiety.  180 tablet  1  . Melatonin 3 MG TABS Take 1 tablet (3 mg total) by mouth at bedtime.  90 tablet  3  . methocarbamol (ROBAXIN) 500 MG tablet Take 1 tablet (500 mg total) by mouth 3 (three) times daily.  300 tablet  3  .  oxyCODONE-acetaminophen (PERCOCET) 10-325 MG per tablet 1 by mouth 3 times a day  180 tablet  0  . polyethylene glycol (MIRALAX / GLYCOLAX) packet Take 17 g by mouth daily as needed (for constipation).      . zinc oxide (BALMEX) 11.3 % CREA cream Apply 1 application topically 3 (three) times daily.      Marland Kitchen levETIRAcetam (KEPPRA) 1000 MG tablet Take 1 tablet (1,000 mg total) by mouth 2 (two) times daily.  180 tablet  3   No current facility-administered medications for this visit.  REVIEW OF SYSTEMS:   Constitutional: Denies fevers, chills or abnormal night sweats Eyes: Denies blurriness of vision, double vision or watery eyes Ears, nose, mouth, throat, and face: Denies mucositis or sore throat Respiratory: Denies cough, dyspnea or wheezes Cardiovascular: Denies palpitation, chest discomfort or lower extremity swelling Gastrointestinal:  Denies nausea, heartburn or change in bowel habits Skin: Denies abnormal skin rashes Lymphatics: Denies new lymphadenopathy or easy bruising Neurological:minimal feeling in lower extremity paralysis, left upper extremity weakness  Behavioral/Psych: Mood is stable, no new changes  Breast:  Denies any palpable lumps or discharge All other systems were reviewed with the patient and are negative.  PHYSICAL EXAMINATION: ECOG PERFORMANCE STATUS: 2 - Symptomatic, <50% confined to bed  Filed Vitals:   05/30/14 1238  BP: 156/78  Pulse: 62  Temp: 98 F (36.7 C)  Resp: 20   Filed Weights    GENERAL:alert, no distress and comfortable SKIN: skin color, texture, turgor are normal, no rashes or significant lesions EYES: normal, conjunctiva are pink and non-injected, sclera clear OROPHARYNX:no exudate, no erythema and lips, buccal mucosa, and tongue normal  NECK: supple, thyroid normal size, non-tender, without nodularity LYMPH:  no palpable lymphadenopathy in the cervical, axillary or inguinal LUNGS: clear to auscultation and percussion with normal  breathing effort HEART: regular rate & rhythm and no murmurs and no lower extremity edema ABDOMEN:abdomen soft, non-tender and normal bowel sounds Musculoskeletal:no cyanosis of digits and no clubbing  PSYCH: alert & oriented x 3 with fluent speech NEURO: Bilateral lower extremity paralysis BREAST: No palpable nodules in breast changes related to previous biopsy . No palpable axillary or supraclavicular lymphadenopathy  LABORATORY DATA:  I have reviewed the data as listed Lab Results  Component Value Date   WBC 7.3 05/30/2014   HGB 14.6 05/30/2014   HCT 44.9 05/30/2014   MCV 91.2 05/30/2014   PLT 221 05/30/2014   Lab Results  Component Value Date   NA 143 05/30/2014   K 3.5 05/30/2014   CL 103 07/17/2013   CO2 28 05/30/2014    RADIOGRAPHIC STUDIES: I have personally reviewed the radiological reports and agreed with the findings in the report.  ASSESSMENT AND PLAN:  Breast cancer of lower-outer quadrant of left female breast Left breast invasive lobular cancer 2 cm with perineural invasion, grade 2, ER PR positive, HER-2 negative, Ki-67 25% T1 C. N0 M0 stage IA clinical stage  Discussed with the patient, the details of pathology including the type of breast cancer,the clinical staging, the significance of ER, PR and HER-2/neu receptors and the implications for treatment. After reviewing the pathology in detail, we proceeded to discuss the different treatment options between surgery, radiation, chemotherapy, antiestrogen therapies.  Recommendation: After discussing her case in detail with Dr. Leonie Man, her neurologist, the recommendation was for her to undergo surgery with lumpectomy with sentinel lymph node study. This would be followed by Oncotype DX testing is not final pathology to determine whether or not she requires chemotherapy. If she does not need chemotherapy, she will undergo radiation followed by antiestrogen therapy with aromatase inhibitors. Given her extensive  neurological issues, we may elect to hold off on chemotherapy and plan only antiestrogen therapy. Aromatase inhibitors certainly have a very small risk of stroke and 1-2%. But we feel that the benefits over weight the risks.  I will see her back after surgery to discuss further adjuvant treatment options.   Given her extensive family history of breast cancer, she will also be seeing genetic counseling.  All questions  were answered. The patient knows to call the clinic with any problems, questions or concerns. I spent 55 minutes counseling the patient face to face. The total time spent in the appointment was 60 minutes and more than 50% was on counseling.     Rulon Eisenmenger, MD 05/30/2014 3:48 PM

## 2014-06-04 ENCOUNTER — Encounter: Payer: Self-pay | Admitting: General Practice

## 2014-06-04 NOTE — Progress Notes (Signed)
[facility] Psychosocial Distress Screening Spiritual Care  Visited with Stacy Diaz and husband Stacy Diaz to introduce [facility] team/resources and to review distress screen per protocol.  Joined by Stacy Diaz, Counseling Intern.  The patient scored a 6 on the Psychosocial Distress Thermometer which indicates moderate distress. Chaplain and Counseling Intern assessed for distress and other psychosocial needs.   ONCBCN DISTRESS SCREENING 06/04/2014  Screening Type Initial Screening  Elta [place] the number that describes how much distress you have been experiencing in the past week 6  Emotional problem type Nervousness/Anxiety;Adjusting to illness  Information Concerns Type Lack of info about diagnosis;Lack of info about treatment;Lack of info about complementary therapy choices;Lack of info about maintaining fitness  Physical Problem type Sleep/insomnia;Getting around;Bathing/dressing;Constipation/diarrhea  Referral to clinical social work Yes  Referral to support programs Yes  Referral to palliative care Yes  Other [name] reports increased distress since starting breast clinic because of the number and type of decisions she is facing, particularly with her hx brain tumor and stroke.  Along with counseling intern, affirmed pt's and husband's long hx of coping with difficulty.  Couple reports support from church, prayer chains, and [employer].  Provided pastoral presence, reflective listening, and opportunity for couple to share and process feelings, coping tools, questions, and concerns about new dx and neurological needs.  Follow up needed: No.  Pt/family aware and appreciative of Support Center resources.  Provided information about how to contact staff team for support as needed.  [place], [place]

## 2014-06-06 ENCOUNTER — Telehealth: Payer: Self-pay | Admitting: *Deleted

## 2014-06-06 NOTE — Telephone Encounter (Signed)
Spoke with patient's husband.  He states he still doesn't know when his wife's surgery is scheduled for and did not fully understand why she has to go to [facility] the morning of surgery to have a wire placed.  He states he was very displeased with the way he was talked to from a surgery scheduler at [facility].  I informed him I would call [facility] to find out.  Confirmed follow up appointment with Dr. Lindi Adie for 07/02/14 at 315pm.  Offered support and encouraged him to call with any needs or concerns.

## 2014-06-07 ENCOUNTER — Encounter: Payer: Self-pay | Admitting: Genetic Counselor

## 2014-06-07 ENCOUNTER — Ambulatory Visit (HOSPITAL_BASED_OUTPATIENT_CLINIC_OR_DEPARTMENT_OTHER): Payer: Medicare Other | Admitting: Genetic Counselor

## 2014-06-07 ENCOUNTER — Other Ambulatory Visit: Payer: Medicare Other

## 2014-06-07 DIAGNOSIS — Z315 Encounter for genetic counseling: Secondary | ICD-10-CM

## 2014-06-07 DIAGNOSIS — Z853 Personal history of malignant neoplasm of breast: Secondary | ICD-10-CM

## 2014-06-07 DIAGNOSIS — Z803 Family history of malignant neoplasm of breast: Secondary | ICD-10-CM | POA: Insufficient documentation

## 2014-06-07 DIAGNOSIS — C50512 Malignant neoplasm of lower-outer quadrant of left female breast: Secondary | ICD-10-CM

## 2014-06-07 NOTE — Progress Notes (Signed)
HISTORY OF PRESENT ILLNESS: Dr. Sherren Mocha requested a cancer genetics consultation for Stacy Diaz, a 63 y.o. female, due to a personal and family history of cancer.  [name] presents to clinic today to discuss the possibility of a hereditary predisposition to cancer, genetic testing, and to further clarify her future cancer risks, as well as potential cancer risk for family members. [name] was diagnosed with left breast cancer (ER+/PR+/Her2NEU-) at the age of 52. She has no history of other cancer but was diagnosed with a meningioma in 1999. She is accompanied to clinic by her husband, Stacy Diaz, and daughter, Stacy Diaz.  Past Medical History  Diagnosis Date   HTN (hypertension)    Hyperlipidemia    Meningioma 1999   Myocardial infarction 1985?   Headache(784.0)     "not real frequent" (08/25/2012)   Arthritis     "probably" (08/25/2012)   Stroke 08/25/2012    "that's what they think I've had; sudden LUE weakness" (08/25/2012)   Spastic paraparesis     S/P meningioma resection 1999/notes 08/25/2012   Paresis of lower extremity 1999    BLE/notes 08/25/2012   Anxiety    Heart disease    Breast cancer of lower-outer quadrant of left female breast 05/25/2014   Seizures     Past Surgical History  Procedure Laterality Date   Coronary angioplasty  1970's   Cardiac catheterization  1970's   Brain meningioma excision  1999    History   Social History   Marital Status: Married    Spouse Name: david    Number of Children: 2   Years of Education: college   Occupational History   disabled    Social History Main Topics   Smoking status: Former Smoker -- 2.00 packs/day for 10 years    Types: Cigarettes   Smokeless tobacco: Never Used     Diaz: 08/25/2012 "quit smoking cigarettes in 1985 when I had my heart attack"   Alcohol Use: 4.2 oz/week    7 Glasses of wine per week     Diaz: 08/25/2012 "glass of wine qd"   Drug Use: No   Sexual Activity: No   Other Topics  Concern   Not on file   Social History Narrative   No narrative on file     FAMILY HISTORY:  During the visit, a 4-generation pedigree was obtained. Significant diagnoses include the following:  Family History  Problem Relation Age of Onset   Breast cancer Mother 73    unilateral   Heart disease Father    Breast cancer Paternal Grandmother 40    unilateral    [name]'s ancestry is of Caucasian descent. There is no known Jewish ancestry or consanguinity.  GENETIC COUNSELING ASSESSMENT: [name] is a 62 y.o. female with a personal and family history of cancer which is no highly suggestive of a hereditary predisposition to cancer. We, therefore, discussed and recommended the following at today's visit.   DISCUSSION / PLAN: We reviewed the characteristics, features and inheritance patterns of hereditary cancer syndromes. We also discussed genetic testing, including the appropriate family members to test, the process of testing, insurance coverage and turn-around-time for results. We discussed with [name] and her family that the family history is not highly consistent with a familial hereditary cancer syndrome, and we feel she is at low risk to harbor a gene mutation associated with such a condition. Thus, we did not recommend any genetic testing, at this time. We recommended Ms. Leber continue to follow  the cancer management and screening guidelines given to her by her oncology and primary providers.  We also encouraged [name] to remain in contact with cancer genetics annually so that we can continuously update the family history and inform her of any changes in cancer genetics and testing that may be of benefit for this family. Ms.  Stacy Diaz questions were answered to her satisfaction today. Our contact information was provided should additional questions or concerns arise.   Thank you for the referral and allowing [place] to share in the care of your patient.    The patient was seen for a total of 45 minutes in face-to-face genetic counseling.  This patient was discussed with Dr. [name] who agrees with the above.    _______________________________________________________________________ For Office Staff:  Number of people involved in session: 4 Was an Intern/ student involved with case: not applicable

## 2014-06-11 ENCOUNTER — Telehealth: Payer: Self-pay | Admitting: Family Medicine

## 2014-06-11 NOTE — Telephone Encounter (Signed)
Pt request refill oxyCODONE-acetaminophen (PERCOCET) 10-325 MG per tablet Pt's husband wuld like you to call b/c pt has been dx w/ breast cancer.  Nov 6 surgery

## 2014-06-12 ENCOUNTER — Encounter (HOSPITAL_COMMUNITY): Payer: Self-pay | Admitting: Pharmacy Technician

## 2014-06-12 MED ORDER — OXYCODONE-ACETAMINOPHEN 10-325 MG PO TABS: ORAL_TABLET | ORAL | Status: DC

## 2014-06-12 NOTE — Telephone Encounter (Signed)
Rx ready for pick up and patient is aware 

## 2014-06-13 ENCOUNTER — Other Ambulatory Visit (HOSPITAL_COMMUNITY): Payer: Self-pay | Admitting: *Deleted

## 2014-06-13 NOTE — Pre-Procedure Instructions (Signed)
Stacy Diaz  06/13/2014   Your procedure is scheduled on:  Friday, June 22, 2014 at 10:15 AM.   Report to [facility] Entrance "A" Admitting Office at 8:15 AM.   Call this number if you have problems the morning of surgery: [phone]   Remember:   Do not eat food or drink liquids after midnight Thursday, 06/21/14.   Take these medicines the morning of surgery with A SIP OF WATER: atenolol-chlorthalidone (TENORETIC), baclofen (LIORESAL), levETIRAcetam (KEPPRA), lacosamide (VIMPAT), oxyCODONE-acetaminophen (PERCOCET) - if needed, LORazepam (ATIVAN) - if needed.    Stop Plavix, Aspirin, and Vitamins as of Friday, 06/15/14.   Do not wear jewelry, make-up or nail polish.  Do not wear lotions, powders, or perfumes. You may wear deodorant.  Do not shave 48 hours prior to surgery.   Do not bring valuables to the hospital.  [facility] is not responsible                  for any belongings or valuables.               Contacts, dentures or bridgework may not be worn into surgery.  Leave suitcase in the car. After surgery it may be brought to your room.  For patients admitted to the hospital, discharge time is determined by your                treatment team.               Patients discharged the day of surgery will not be allowed to drive home.     Please read over the following fact sheets that you were given: Pain Booklet, Coughing and Deep Breathing and Surgical Site Infection Prevention

## 2014-06-14 ENCOUNTER — Encounter (HOSPITAL_COMMUNITY)
Admission: RE | Admit: 2014-06-14 | Discharge: 2014-06-14 | Disposition: A | Discharge status: Home / Self Care | Payer: Medicare Other | Source: Ambulatory Visit | Attending: General Surgery | Admitting: General Surgery

## 2014-06-14 ENCOUNTER — Encounter (HOSPITAL_COMMUNITY): Payer: Self-pay

## 2014-06-14 ENCOUNTER — Other Ambulatory Visit: Payer: Self-pay | Admitting: Family Medicine

## 2014-06-14 DIAGNOSIS — Z01818 Encounter for other preprocedural examination: Secondary | ICD-10-CM | POA: Diagnosis present

## 2014-06-14 DIAGNOSIS — D329 Benign neoplasm of meninges, unspecified: Secondary | ICD-10-CM | POA: Diagnosis not present

## 2014-06-14 DIAGNOSIS — G40909 Epilepsy, unspecified, not intractable, without status epilepticus: Secondary | ICD-10-CM | POA: Diagnosis not present

## 2014-06-14 DIAGNOSIS — E785 Hyperlipidemia, unspecified: Secondary | ICD-10-CM | POA: Insufficient documentation

## 2014-06-14 DIAGNOSIS — Z8673 Personal history of transient ischemic attack (TIA), and cerebral infarction without residual deficits: Secondary | ICD-10-CM | POA: Diagnosis not present

## 2014-06-14 DIAGNOSIS — I252 Old myocardial infarction: Secondary | ICD-10-CM | POA: Insufficient documentation

## 2014-06-14 DIAGNOSIS — G8220 Paraplegia, unspecified: Secondary | ICD-10-CM | POA: Insufficient documentation

## 2014-06-14 DIAGNOSIS — Z87891 Personal history of nicotine dependence: Secondary | ICD-10-CM | POA: Insufficient documentation

## 2014-06-14 DIAGNOSIS — I2510 Atherosclerotic heart disease of native coronary artery without angina pectoris: Secondary | ICD-10-CM | POA: Insufficient documentation

## 2014-06-14 DIAGNOSIS — I10 Essential (primary) hypertension: Secondary | ICD-10-CM | POA: Insufficient documentation

## 2014-06-14 LAB — BASIC METABOLIC PANEL
Anion gap: 17 — ABNORMAL HIGH (ref 5–15)
BUN: 15 mg/dL (ref 6–23)
CO2: 25 meq/L (ref 19–32)
CREATININE: 0.57 mg/dL (ref 0.50–1.10)
Calcium: 10 mg/dL (ref 8.4–10.5)
Chloride: 100 mEq/L (ref 96–112)
GFR calc Af Amer: >90 mL/min (ref >90)
GFR calc non Af Amer: >90 mL/min (ref >90)
GLUCOSE: 83 mg/dL (ref 70–99)
Potassium: 3.7 mEq/L (ref 3.7–5.3)
Sodium: 142 mEq/L (ref 137–147)

## 2014-06-14 LAB — CBC
HEMATOCRIT: 46.5 % — AB (ref 36.0–46.0)
HEMOGLOBIN: 15.2 g/dL — AB (ref 12.0–15.0)
MCH: 29.9 pg (ref 26.0–34.0)
MCHC: 32.7 g/dL (ref 30.0–36.0)
MCV: 91.4 fL (ref 78.0–100.0)
Platelets: 209 10*3/uL (ref 150–400)
RBC: 5.09 MIL/uL (ref 3.87–5.11)
RDW: 13 % (ref 11.5–15.5)
WBC: 7.4 10*3/uL (ref 4.0–10.5)

## 2014-06-14 NOTE — Progress Notes (Signed)
Anesthesia:  Patient is a 63 year old female scheduled for left wire localized lumpectomy and SN mapping on 06/22/14 by Dr. [name].  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  She was a heavy smoker and quit then).  PCP is Dr. Stevie Kern. She moved to [place] around 1995.  Dr. Sherren Mocha had her see a [facility] Shape Cardiologist (now [facility]) initially for a stress test but says she was eventually discharged back to Dr. Sherren Mocha with PRN cardiology follow-up recommended (that was ~ > 15 years ago).  Neurologist is Dr. Leonie Man. Her last seizure was approximately one month ago. Her hematologist Dr. Lindi Adie has already spoken with Dr. Leonie Man regarding plans for surgery and post-operative adjuvant therapies.   Meds include Ativan, ASA 325mg , Tenoretic, Lipitor, Plavix (on hold for two weeks for surgery), Valium, Vimpat (changed from [place] following her last seizure).  Patient denies chest pain, SOB. She is only able to stand, but is otherwise wheelchair bound.    EKG on 06/14/14 showed: NSR, minimal voltage criteria for LVH, septal infarct, age undetermined.  Negative T waves in V1, 2. PVC has resolved, otherwise I think her EKG appears stable when compared to prior tracing on 05/30/13.  Echo on 08/26/12: Left ventricle: The cavity size was normal. Wall thickness was normal. Systolic function was normal. The estimated ejection fraction was in the range of 50% to 55%. Trivial TR.  Preoperative labs noted.    I discussed history including remote MI history with anesthesiologist Dr. Therisa Doyne.  Patient has had no CV symptoms although her activity is limited due to her spastic paraparesis. She is no longer smoking. She has had routine medical follow-up. Her EKG appears  stable. In regards to her seizure history, Dr. Leonie Man is aware of plans for surgery.  If no acute changes then it is anticipated that she can proceed as planned.    Of note, her husband did discuss some concern about anesthesia. As mentioned she developed spastic paraparesis following a "13 hour long" procedure with Dr. Sherwood Gambler for meningioma resection in '99.  Patient's husband wondered if the prolonged anesthesia time also effected her. I don't have any of those records to review, but I would think her symptoms were more due to the nature of her surgery.  They will talk with her assigned anesthesiologist about the definitive anesthesia plan on the morning of surgery.  George Hugh [facility] Short Stay Center/Anesthesiology Phone [phone] 06/14/2014 3:50 PM

## 2014-06-14 NOTE — Progress Notes (Signed)
No cardiologist. PCP: Dr. Sherren Mocha Neurologistt: Dr. Gretchen Short

## 2014-06-15 ENCOUNTER — Other Ambulatory Visit: Payer: Self-pay | Admitting: Family Medicine

## 2014-06-21 MED ORDER — CEFAZOLIN SODIUM-DEXTROSE 2-3 GM-% IV SOLR
2.0000 g | INTRAVENOUS | Status: AC
  Administered 2014-06-22: 2 g via INTRAVENOUS
  Filled 2014-06-21: qty 50

## 2014-06-22 ENCOUNTER — Ambulatory Visit (HOSPITAL_COMMUNITY): Payer: Medicare Other | Admitting: Vascular Surgery

## 2014-06-22 ENCOUNTER — Encounter (HOSPITAL_COMMUNITY): Payer: Self-pay | Admitting: Anesthesiology

## 2014-06-22 ENCOUNTER — Encounter (HOSPITAL_COMMUNITY)
Admission: RE | Disposition: A | Discharge status: Home / Self Care | Payer: Self-pay | Source: Ambulatory Visit | Attending: General Surgery

## 2014-06-22 ENCOUNTER — Ambulatory Visit (HOSPITAL_COMMUNITY)
Admission: RE | Admit: 2014-06-22 | Discharge: 2014-06-23 | Disposition: A | Discharge status: Home / Self Care | Payer: Medicare Other | Source: Ambulatory Visit | Attending: General Surgery | Admitting: General Surgery

## 2014-06-22 ENCOUNTER — Ambulatory Visit (HOSPITAL_COMMUNITY): Payer: Medicare Other | Admitting: Anesthesiology

## 2014-06-22 ENCOUNTER — Encounter (HOSPITAL_COMMUNITY)
Admission: RE | Admit: 2014-06-22 | Discharge: 2014-06-22 | Disposition: A | Discharge status: Home / Self Care | Payer: Medicare Other | Source: Ambulatory Visit | Attending: General Surgery | Admitting: General Surgery

## 2014-06-22 DIAGNOSIS — Z87891 Personal history of nicotine dependence: Secondary | ICD-10-CM | POA: Insufficient documentation

## 2014-06-22 DIAGNOSIS — F419 Anxiety disorder, unspecified: Secondary | ICD-10-CM | POA: Insufficient documentation

## 2014-06-22 DIAGNOSIS — I10 Essential (primary) hypertension: Secondary | ICD-10-CM | POA: Insufficient documentation

## 2014-06-22 DIAGNOSIS — R569 Unspecified convulsions: Secondary | ICD-10-CM | POA: Insufficient documentation

## 2014-06-22 DIAGNOSIS — C50512 Malignant neoplasm of lower-outer quadrant of left female breast: Secondary | ICD-10-CM | POA: Diagnosis not present

## 2014-06-22 DIAGNOSIS — C50919 Malignant neoplasm of unspecified site of unspecified female breast: Secondary | ICD-10-CM | POA: Diagnosis present

## 2014-06-22 DIAGNOSIS — C50912 Malignant neoplasm of unspecified site of left female breast: Secondary | ICD-10-CM

## 2014-06-22 DIAGNOSIS — M1990 Unspecified osteoarthritis, unspecified site: Secondary | ICD-10-CM | POA: Diagnosis not present

## 2014-06-22 HISTORY — PX: BREAST LUMPECTOMY WITH NEEDLE LOCALIZATION AND AXILLARY SENTINEL LYMPH NODE BX: SHX5760

## 2014-06-22 SURGERY — BREAST LUMPECTOMY WITH NEEDLE LOCALIZATION AND AXILLARY SENTINEL LYMPH NODE BX
Anesthesia: General | Site: Breast | Laterality: Left

## 2014-06-22 MED ORDER — MIDAZOLAM HCL 5 MG/5ML IJ SOLN
INTRAMUSCULAR | Status: DC | PRN
  Administered 2014-06-22 (×2): 1 mg via INTRAVENOUS

## 2014-06-22 MED ORDER — METHOCARBAMOL 500 MG PO TABS
500.0000 mg | ORAL_TABLET | Freq: Three times a day (TID) | ORAL | Status: DC
  Administered 2014-06-22 – 2014-06-23 (×3): 500 mg via ORAL
  Filled 2014-06-22 (×4): qty 1

## 2014-06-22 MED ORDER — LACOSAMIDE 50 MG PO TABS
50.0000 mg | ORAL_TABLET | Freq: Two times a day (BID) | ORAL | Status: DC
  Administered 2014-06-22 – 2014-06-23 (×2): 50 mg via ORAL
  Filled 2014-06-22 (×2): qty 1

## 2014-06-22 MED ORDER — 0.9 % SODIUM CHLORIDE (POUR BTL) OPTIME
TOPICAL | Status: DC | PRN
  Administered 2014-06-22: 1000 mL

## 2014-06-22 MED ORDER — BACLOFEN 20 MG PO TABS
20.0000 mg | ORAL_TABLET | Freq: Four times a day (QID) | ORAL | Status: DC
  Administered 2014-06-22 – 2014-06-23 (×3): 20 mg via ORAL
  Filled 2014-06-22 (×6): qty 1

## 2014-06-22 MED ORDER — GLYCOPYRROLATE 0.2 MG/ML IJ SOLN
INTRAMUSCULAR | Status: DC | PRN
  Administered 2014-06-22: 0.2 mg via INTRAVENOUS
  Administered 2014-06-22: 0.6 mg via INTRAVENOUS

## 2014-06-22 MED ORDER — LIDOCAINE 5 % EX PTCH
1.0000 | MEDICATED_PATCH | Freq: Every day | CUTANEOUS | Status: DC | PRN
  Filled 2014-06-22: qty 1

## 2014-06-22 MED ORDER — ATORVASTATIN CALCIUM 10 MG PO TABS
10.0000 mg | ORAL_TABLET | Freq: Every day | ORAL | Status: DC
  Administered 2014-06-23: 10 mg via ORAL
  Filled 2014-06-22: qty 1

## 2014-06-22 MED ORDER — OXYCODONE-ACETAMINOPHEN 5-325 MG PO TABS
ORAL_TABLET | ORAL | Status: AC
  Administered 2014-06-22: 2
  Filled 2014-06-22: qty 2

## 2014-06-22 MED ORDER — LIDOCAINE HCL (CARDIAC) 20 MG/ML IV SOLN
INTRAVENOUS | Status: AC
  Filled 2014-06-22: qty 5

## 2014-06-22 MED ORDER — CLOPIDOGREL BISULFATE 75 MG PO TABS
75.0000 mg | ORAL_TABLET | Freq: Every day | ORAL | Status: DC
  Administered 2014-06-23: 75 mg via ORAL
  Filled 2014-06-22 (×2): qty 1

## 2014-06-22 MED ORDER — KCL IN DEXTROSE-NACL 20-5-0.9 MEQ/L-%-% IV SOLN
INTRAVENOUS | Status: DC
  Administered 2014-06-22: 16:00:00 via INTRAVENOUS
  Filled 2014-06-22 (×2): qty 1000

## 2014-06-22 MED ORDER — CHLORTHALIDONE 25 MG PO TABS
25.0000 mg | ORAL_TABLET | Freq: Every day | ORAL | Status: DC
  Administered 2014-06-23: 25 mg via ORAL
  Filled 2014-06-22 (×2): qty 1

## 2014-06-22 MED ORDER — PROPOFOL 10 MG/ML IV BOLUS
INTRAVENOUS | Status: DC | PRN
  Administered 2014-06-22: 150 mg via INTRAVENOUS

## 2014-06-22 MED ORDER — LACTATED RINGERS IV SOLN
INTRAVENOUS | Status: DC
  Administered 2014-06-22: 10:00:00 via INTRAVENOUS

## 2014-06-22 MED ORDER — MIDAZOLAM HCL 2 MG/2ML IJ SOLN
INTRAMUSCULAR | Status: AC
  Administered 2014-06-22: 2 mg via INTRAVENOUS
  Filled 2014-06-22: qty 2

## 2014-06-22 MED ORDER — MIDAZOLAM HCL 2 MG/2ML IJ SOLN
1.0000 mg | INTRAMUSCULAR | Status: DC | PRN
  Administered 2014-06-22: 2 mg via INTRAVENOUS

## 2014-06-22 MED ORDER — OXYCODONE HCL 5 MG PO TABS
5.0000 mg | ORAL_TABLET | Freq: Three times a day (TID) | ORAL | Status: DC
Start: 2014-06-22 — End: 2014-06-23
  Administered 2014-06-22 – 2014-06-23 (×3): 5 mg via ORAL
  Filled 2014-06-22 (×3): qty 1

## 2014-06-22 MED ORDER — ROCURONIUM BROMIDE 100 MG/10ML IV SOLN
INTRAVENOUS | Status: DC | PRN
  Administered 2014-06-22: 40 mg via INTRAVENOUS

## 2014-06-22 MED ORDER — TECHNETIUM TC 99M SULFUR COLLOID FILTERED
1.0000 | Freq: Once | INTRAVENOUS | Status: AC | PRN
  Administered 2014-06-22: 1 via INTRADERMAL

## 2014-06-22 MED ORDER — EPHEDRINE SULFATE 50 MG/ML IJ SOLN
INTRAMUSCULAR | Status: DC | PRN
  Administered 2014-06-22 (×2): 5 mg via INTRAVENOUS

## 2014-06-22 MED ORDER — SODIUM CHLORIDE 0.9 % IJ SOLN
INTRAMUSCULAR | Status: AC
  Filled 2014-06-22: qty 10

## 2014-06-22 MED ORDER — ONDANSETRON HCL 4 MG PO TABS: 4.0000 mg | ORAL_TABLET | Freq: Four times a day (QID) | ORAL | Status: DC | PRN

## 2014-06-22 MED ORDER — POTASSIUM CHLORIDE CRYS ER 10 MEQ PO TBCR
10.0000 meq | EXTENDED_RELEASE_TABLET | Freq: Two times a day (BID) | ORAL | Status: DC
  Administered 2014-06-22 – 2014-06-23 (×2): 10 meq via ORAL
  Filled 2014-06-22 (×3): qty 1

## 2014-06-22 MED ORDER — OXYCODONE-ACETAMINOPHEN 5-325 MG PO TABS
1.0000 | ORAL_TABLET | Freq: Three times a day (TID) | ORAL | Status: DC
  Administered 2014-06-22 – 2014-06-23 (×3): 1 via ORAL
  Filled 2014-06-22 (×3): qty 1

## 2014-06-22 MED ORDER — METHYLENE BLUE 1 % INJ SOLN
INTRAMUSCULAR | Status: AC
  Filled 2014-06-22: qty 10

## 2014-06-22 MED ORDER — MORPHINE SULFATE 4 MG/ML IJ SOLN: 4.0000 mg | INTRAMUSCULAR | Status: DC | PRN

## 2014-06-22 MED ORDER — POLYETHYLENE GLYCOL 3350 17 G PO PACK: 17.0000 g | PACK | Freq: Every day | ORAL | Status: DC | PRN

## 2014-06-22 MED ORDER — LEVETIRACETAM 500 MG PO TABS
1000.0000 mg | ORAL_TABLET | Freq: Two times a day (BID) | ORAL | Status: DC
  Filled 2014-06-22 (×4): qty 2

## 2014-06-22 MED ORDER — DIAZEPAM 5 MG PO TABS
5.0000 mg | ORAL_TABLET | Freq: Every day | ORAL | Status: DC
  Administered 2014-06-22: 5 mg via ORAL
  Filled 2014-06-22: qty 1

## 2014-06-22 MED ORDER — BUPIVACAINE-EPINEPHRINE 0.25% -1:200000 IJ SOLN
INTRAMUSCULAR | Status: DC | PRN
  Administered 2014-06-22: 10 mL

## 2014-06-22 MED ORDER — NEOSTIGMINE METHYLSULFATE 10 MG/10ML IV SOLN
INTRAVENOUS | Status: DC | PRN
  Administered 2014-06-22: 4 mg via INTRAVENOUS

## 2014-06-22 MED ORDER — FENTANYL CITRATE 0.05 MG/ML IJ SOLN
INTRAMUSCULAR | Status: DC | PRN
  Administered 2014-06-22 (×5): 50 ug via INTRAVENOUS

## 2014-06-22 MED ORDER — LACTATED RINGERS IV SOLN
INTRAVENOUS | Status: DC | PRN
Start: 2014-06-22 — End: 2014-06-22
  Administered 2014-06-22 (×2): via INTRAVENOUS

## 2014-06-22 MED ORDER — ATENOLOL-CHLORTHALIDONE 50-25 MG PO TABS: 1.0000 | ORAL_TABLET | Freq: Every day | ORAL | Status: DC

## 2014-06-22 MED ORDER — HYDROMORPHONE HCL 1 MG/ML IJ SOLN
0.2500 mg | INTRAMUSCULAR | Status: DC | PRN
  Administered 2014-06-22 (×4): 0.5 mg via INTRAVENOUS

## 2014-06-22 MED ORDER — OXYCODONE-ACETAMINOPHEN 10-325 MG PO TABS: 1.0000 | ORAL_TABLET | Freq: Three times a day (TID) | ORAL | Status: DC

## 2014-06-22 MED ORDER — ONDANSETRON HCL 4 MG/2ML IJ SOLN
INTRAMUSCULAR | Status: AC
  Filled 2014-06-22: qty 2

## 2014-06-22 MED ORDER — FENTANYL CITRATE 0.05 MG/ML IJ SOLN
INTRAMUSCULAR | Status: AC
  Filled 2014-06-22: qty 5

## 2014-06-22 MED ORDER — NEOSTIGMINE METHYLSULFATE 10 MG/10ML IV SOLN
INTRAVENOUS | Status: AC
  Filled 2014-06-22: qty 1

## 2014-06-22 MED ORDER — LIDOCAINE HCL (CARDIAC) 20 MG/ML IV SOLN
INTRAVENOUS | Status: DC | PRN
  Administered 2014-06-22: 60 mg via INTRAVENOUS

## 2014-06-22 MED ORDER — BACLOFEN 20 MG PO TABS: 20.0000 mg | ORAL_TABLET | Freq: Four times a day (QID) | ORAL | Status: DC

## 2014-06-22 MED ORDER — HYDROMORPHONE HCL 1 MG/ML IJ SOLN
INTRAMUSCULAR | Status: AC
  Administered 2014-06-22: 0.5 mg via INTRAVENOUS
  Filled 2014-06-22: qty 1

## 2014-06-22 MED ORDER — HEPARIN SODIUM (PORCINE) 5000 UNIT/ML IJ SOLN
5000.0000 [IU] | Freq: Three times a day (TID) | INTRAMUSCULAR | Status: DC
  Administered 2014-06-23: 5000 [IU] via SUBCUTANEOUS
  Filled 2014-06-22 (×3): qty 1

## 2014-06-22 MED ORDER — OXYCODONE HCL 5 MG PO TABS: 5.0000 mg | ORAL_TABLET | Freq: Once | ORAL | Status: DC | PRN

## 2014-06-22 MED ORDER — PROPOFOL 10 MG/ML IV BOLUS
INTRAVENOUS | Status: AC
  Filled 2014-06-22: qty 20

## 2014-06-22 MED ORDER — LORAZEPAM 1 MG PO TABS
1.0000 mg | ORAL_TABLET | Freq: Two times a day (BID) | ORAL | Status: DC | PRN
  Administered 2014-06-22 – 2014-06-23 (×2): 1 mg via ORAL
  Filled 2014-06-22 (×2): qty 1

## 2014-06-22 MED ORDER — ONDANSETRON HCL 4 MG/2ML IJ SOLN: 4.0000 mg | Freq: Four times a day (QID) | INTRAMUSCULAR | Status: DC | PRN

## 2014-06-22 MED ORDER — ROCURONIUM BROMIDE 50 MG/5ML IV SOLN
INTRAVENOUS | Status: AC
  Filled 2014-06-22: qty 1

## 2014-06-22 MED ORDER — GLYCOPYRROLATE 0.2 MG/ML IJ SOLN
INTRAMUSCULAR | Status: AC
  Filled 2014-06-22: qty 2

## 2014-06-22 MED ORDER — ATENOLOL 50 MG PO TABS
50.0000 mg | ORAL_TABLET | Freq: Every day | ORAL | Status: DC
  Administered 2014-06-23: 50 mg via ORAL
  Filled 2014-06-22 (×2): qty 1

## 2014-06-22 MED ORDER — BUPIVACAINE-EPINEPHRINE (PF) 0.5% -1:200000 IJ SOLN
INTRAMUSCULAR | Status: DC | PRN
  Administered 2014-06-22: 20 mL via PERINEURAL

## 2014-06-22 MED ORDER — METHOCARBAMOL 500 MG PO TABS
ORAL_TABLET | ORAL | Status: AC
  Administered 2014-06-22: 500 mg via ORAL
  Filled 2014-06-22: qty 1

## 2014-06-22 MED ORDER — DEXAMETHASONE SODIUM PHOSPHATE 4 MG/ML IJ SOLN
INTRAMUSCULAR | Status: DC | PRN
  Administered 2014-06-22: 8 mg via INTRAVENOUS

## 2014-06-22 MED ORDER — DOCUSATE SODIUM 100 MG PO CAPS
100.0000 mg | ORAL_CAPSULE | Freq: Two times a day (BID) | ORAL | Status: DC
  Administered 2014-06-22: 100 mg via ORAL
  Filled 2014-06-22 (×2): qty 1

## 2014-06-22 MED ORDER — MIDAZOLAM HCL 2 MG/2ML IJ SOLN
INTRAMUSCULAR | Status: AC
  Filled 2014-06-22: qty 2

## 2014-06-22 MED ORDER — OXYCODONE HCL 5 MG/5ML PO SOLN: 5.0000 mg | Freq: Once | ORAL | Status: DC | PRN

## 2014-06-22 MED ORDER — MENTHOL 3 MG MT LOZG: 1.0000 | LOZENGE | OROMUCOSAL | Status: DC | PRN

## 2014-06-22 MED ORDER — BUPIVACAINE-EPINEPHRINE (PF) 0.25% -1:200000 IJ SOLN
INTRAMUSCULAR | Status: AC
  Filled 2014-06-22: qty 30

## 2014-06-22 MED ORDER — ONDANSETRON HCL 4 MG/2ML IJ SOLN
INTRAMUSCULAR | Status: DC | PRN
  Administered 2014-06-22: 4 mg via INTRAVENOUS

## 2014-06-22 SURGICAL SUPPLY — 50 items
ADH SKN CLS APL DERMABOND .7 (GAUZE/BANDAGES/DRESSINGS) ×1
APPLIER CLIP 9.375 MED OPEN (MISCELLANEOUS)
APR CLP MED 9.3 20 MLT OPN (MISCELLANEOUS)
BINDER BREAST LRG (GAUZE/BANDAGES/DRESSINGS) IMPLANT
BINDER BREAST XLRG (GAUZE/BANDAGES/DRESSINGS) IMPLANT
CANISTER SUCTION 2500CC (MISCELLANEOUS) ×2 IMPLANT
CHLORAPREP W/TINT 26ML (MISCELLANEOUS) ×2 IMPLANT
CLIP APPLIE 9.375 MED OPEN (MISCELLANEOUS) IMPLANT
CONT SPEC 4OZ CLIKSEAL STRL BL (MISCELLANEOUS) ×2 IMPLANT
COVER PROBE W GEL 5X96 (DRAPES) ×2 IMPLANT
COVER SURGICAL LIGHT HANDLE (MISCELLANEOUS) ×2 IMPLANT
DERMABOND ADVANCED (GAUZE/BANDAGES/DRESSINGS) ×1
DERMABOND ADVANCED .7 DNX12 (GAUZE/BANDAGES/DRESSINGS) ×1 IMPLANT
DEVICE DUBIN SPECIMEN MAMMOGRA (MISCELLANEOUS) ×2 IMPLANT
DRAPE CHEST BREAST 15X10 FENES (DRAPES) ×2 IMPLANT
DRAPE UTILITY 15X26 W/TAPE STR (DRAPE) ×4 IMPLANT
ELECT COATED BLADE 2.86 ST (ELECTRODE) ×2 IMPLANT
ELECT REM PT RETURN 9FT ADLT (ELECTROSURGICAL) ×2
ELECTRODE REM PT RTRN 9FT ADLT (ELECTROSURGICAL) ×1 IMPLANT
GLOVE BIO SURGEON STRL SZ7.5 (GLOVE) ×2 IMPLANT
GLOVE BIOGEL PI IND STRL 7.0 (GLOVE) IMPLANT
GLOVE BIOGEL PI IND STRL 7.5 (GLOVE) IMPLANT
GLOVE BIOGEL PI INDICATOR 7.0 (GLOVE) ×1
GLOVE BIOGEL PI INDICATOR 7.5 (GLOVE) ×1
GLOVE SURG SS PI 7.0 STRL IVOR (GLOVE) ×1 IMPLANT
GOWN STRL REUS W/ TWL LRG LVL3 (GOWN DISPOSABLE) ×2 IMPLANT
GOWN STRL REUS W/TWL LRG LVL3 (GOWN DISPOSABLE) ×4
KIT BASIN OR (CUSTOM PROCEDURE TRAY) ×2 IMPLANT
KIT MARKER MARGIN INK (KITS) IMPLANT
KIT ROOM TURNOVER OR (KITS) ×2 IMPLANT
NDL 18GX1X1/2 (RX/OR ONLY) (NEEDLE) ×1 IMPLANT
NDL HYPO 25GX1X1/2 BEV (NEEDLE) ×2 IMPLANT
NEEDLE 18GX1X1/2 (RX/OR ONLY) (NEEDLE) ×2 IMPLANT
NEEDLE HYPO 25GX1X1/2 BEV (NEEDLE) ×4 IMPLANT
NS IRRIG 1000ML POUR BTL (IV SOLUTION) ×2 IMPLANT
PACK SURGICAL SETUP 50X90 (CUSTOM PROCEDURE TRAY) ×2 IMPLANT
PAD ARMBOARD 7.5X6 YLW CONV (MISCELLANEOUS) ×2 IMPLANT
PENCIL BUTTON HOLSTER BLD 10FT (ELECTRODE) ×2 IMPLANT
SPONGE LAP 18X18 X RAY DECT (DISPOSABLE) ×2 IMPLANT
SUT MNCRL AB 4-0 PS2 18 (SUTURE) ×2 IMPLANT
SUT SILK 2 0 SH (SUTURE) IMPLANT
SUT VIC AB 3-0 54X BRD REEL (SUTURE) ×1 IMPLANT
SUT VIC AB 3-0 BRD 54 (SUTURE) ×2
SUT VIC AB 3-0 SH 18 (SUTURE) ×2 IMPLANT
SYR BULB 3OZ (MISCELLANEOUS) ×2 IMPLANT
SYR CONTROL 10ML LL (SYRINGE) ×4 IMPLANT
TOWEL OR 17X24 6PK STRL BLUE (TOWEL DISPOSABLE) ×2 IMPLANT
TOWEL OR 17X26 10 PK STRL BLUE (TOWEL DISPOSABLE) ×2 IMPLANT
TUBE CONNECTING 12X1/4 (SUCTIONS) ×2 IMPLANT
YANKAUER SUCT BULB TIP NO VENT (SUCTIONS) ×2 IMPLANT

## 2014-06-22 NOTE — Anesthesia Procedure Notes (Signed)
Anesthesia Regional Block:  Pectoralis block  Pre-Anesthetic Checklist: ,, timeout performed, Correct Patient, Correct Site, Correct Laterality, Correct Procedure, Correct Position, site marked, Risks and benefits discussed,  Surgical consent,  Pre-op evaluation,  At surgeon's request and post-op pain management  Laterality: Left  Prep: chloraprep       Needles:  Injection technique: Single-shot  Needle Type: Echogenic Needle     Needle Length: 9cm 9 cm Needle Gauge: 21 and 21 G    Additional Needles:  Procedures: ultrasound guided (picture in chart) Pectoralis block Narrative:  Start time: 06/22/2014 10:17 AM End time: 06/22/2014 10:25 AM Injection made incrementally with aspirations every 5 mL.  Performed by: Personally   Additional Notes: Pt tolerated the procedure well.

## 2014-06-22 NOTE — Plan of Care (Signed)
Problem: Phase I Progression Outcomes Goal: Pain controlled with appropriate interventions Outcome: Completed/Met Date Met:  06/22/14

## 2014-06-22 NOTE — H&P (View-Only) (Signed)
Stacy Diaz 05/30/2014 7:54 AM Location: [facility] Patient #: [id number] DOB: 06/20/1951 Undefined / Language: English / Race: Undefined Female  History of Present Illness (Stacy Diaz; 05/30/2014 3:18 PM) The patient is a 63 year old female who presents with breast cancer. We are asked to see the patient in consultation by Dr. Cornella to evaluate her for a new left breast cancer. The patient is a 63-year-old white female who recently went for a routine screening mammogram. At that time she was found to have an abnormality in the lower outer quadrant of the left breast. 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 It was ER and PR positive and HER-2/neu negative with a Ki-67 of 25%. It measured 2 cm by ultrasound. She denies any breast pain or discharge from her nipple. She does not take any female hormones. 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   Other Problems (Stacy Diaz; 05/30/2014 7:54 AM) Back Pain Bladder Problems Cerebrovascular Accident High blood pressure Myocardial infarction Seizure Disorder  Past Surgical History (Stacy Diaz; 05/30/2014 7:54 AM) Oral Surgery  Diagnostic Studies History (Stacy Diaz; 05/30/2014 7:54 AM) Colonoscopy 1-5 years ago Mammogram within last year Pap Smear 1-5 years ago  Social History (Stacy Diaz; 05/30/2014 7:54 AM) Alcohol use Occasional alcohol use. Caffeine use Carbonated beverages, Coffee. No drug use Tobacco use Former smoker.  Family History (Stacy Diaz; 05/30/2014 7:54 AM) Breast Cancer Mother. Cerebrovascular Accident Father. Hypertension Father.  Pregnancy / Birth History (Stacy Diaz; 05/30/2014 7:54 AM) Age of menopause 46-50 Contraceptive History Oral contraceptives. Gravida 2 Maternal age 31-35 Para 2  Review of Systems (Stacy  Diaz; 05/30/2014 7:54 AM) General Not Present- Appetite Loss, Chills, Fatigue, Fever, Night Sweats, Weight Gain and Weight Loss. Skin Not Present- Change in Wart/Mole, Dryness, Hives, Jaundice, New Lesions, Non-Healing Wounds, Rash and Ulcer. HEENT Present- Wears glasses/contact lenses. Not Present- Earache, Hearing Loss, Hoarseness, Nose Bleed, Oral Ulcers, Ringing in the Ears, Seasonal Allergies, Sinus Pain, Sore Throat, Visual Disturbances and Yellow Eyes. Respiratory Present- Snoring. Not Present- Bloody sputum, Chronic Cough, Difficulty Breathing and Wheezing. Breast Not Present- Breast Mass, Breast Pain, Nipple Discharge and Skin Changes. Cardiovascular Present- Rapid Heart Rate. Not Present- Chest Pain, Difficulty Breathing Lying Down, Leg Cramps, Palpitations, Shortness of Breath and Swelling of Extremities. Gastrointestinal Present- Constipation. Not Present- Abdominal Pain, Bloating, Bloody Stool, Change in Bowel Habits, Chronic diarrhea, Difficulty Swallowing, Excessive gas, Gets full quickly at meals, Hemorrhoids, Indigestion, Nausea, Rectal Pain and Vomiting. Musculoskeletal Present- Muscle Pain and Muscle Weakness. Not Present- Back Pain, Joint Pain, Joint Stiffness and Swelling of Extremities. Neurological Present- Seizures. Not Present- Decreased Memory, Fainting, Headaches, Numbness, Tingling, Tremor, Trouble walking and Weakness. Psychiatric Not Present- Anxiety, Bipolar, Change in Sleep Pattern, Depression, Fearful and Frequent crying. Endocrine Not Present- Cold Intolerance, Excessive Hunger, Hair Changes, Heat Intolerance, Hot flashes and New Diabetes. Hematology Present- Easy Bruising. Not Present- Excessive bleeding, Gland problems, HIV and Persistent Infections.   Physical Exam (Stacy Diaz; 05/30/2014 3:19 PM) General Mental Status-Alert. General Appearance-Consistent with stated age. Hydration-Well hydrated. Voice-Normal.  Head and  Neck Head-normocephalic, atraumatic with no lesions or palpable masses. Trachea-midline. Thyroid Gland Characteristics - normal size and consistency.  Eye Eyeball - Bilateral-Extraocular movements intact. Sclera/Conjunctiva - Bilateral-No scleral icterus.  Chest and Lung Exam Chest and lung exam reveals -quiet, even and easy respiratory effort   with no use of accessory muscles and on auscultation, normal breath sounds, no adventitious sounds and normal vocal resonance. Inspection Chest Wall - Normal. Back - normal.  Breast Note: There is no palpable mass in either breast. There is no palpable axillary, supraclavicular, or cervical lymphadenopathy.   Cardiovascular Cardiovascular examination reveals -normal heart sounds, regular rate and rhythm with no murmurs and normal pedal pulses bilaterally.  Abdomen Inspection Inspection of the abdomen reveals - No Hernias. Skin - Scar - no surgical scars. Palpation/Percussion Palpation and Percussion of the abdomen reveal - Soft, Non Tender, No Rebound tenderness, No Rigidity (guarding) and No hepatosplenomegaly. Auscultation Auscultation of the abdomen reveals - Bowel sounds normal.  Neurologic Neurologic evaluation reveals -alert and oriented x 3 with no impairment of recent or remote memory. Mental Status-Normal.  Musculoskeletal Normal Exam - Left-Upper Extremity Strength Normal and Lower Extremity Strength Normal. Normal Exam - Right-Upper Extremity Strength Normal and Lower Extremity Strength Normal.  Lymphatic Head & Neck  General Head & Neck Lymphatics: Bilateral - Description - Normal. Axillary  General Axillary Region: Bilateral - Description - Normal. Tenderness - Non Tender. Femoral & Inguinal  Generalized Femoral & Inguinal Lymphatics: Bilateral - Description - Normal. Tenderness - Non Tender.    Assessment & Plan (Stacy Diaz; 05/30/2014 3:21 PM) BREAST CANCER, LEFT (174.9   C50.912) MALIGNANT NEOPLASM OF LOWER-OUTER QUADRANT OF LEFT FEMALE BREAST (174.5  C50.512) Impression: The patient has a new stage I breast cancer in the lower outer quadrant. She is at risk of potential stroke given her history but we have contacted her neurologist who has given her clearance for surgery. I have talked her in detail about the different options for treatment and at this point she favors breast conservation and sentinel node mapping. I've discussed with her in detail the risks and benefits of the operation to do this as well as some of the technical aspects and she understands and wishes to proceed. She will need to be off her Plavix for approximately 5 days prior to surgery     Signed by Stacy S Toth, Diaz (05/30/2014 3:22 PM) 

## 2014-06-22 NOTE — Interval H&P Note (Signed)
History and Physical Interval Note:  06/22/2014 8:50 AM  Stacy Diaz  has presented today for surgery, with the diagnosis of Left Breast Cancer  The various methods of treatment have been discussed with the patient and family. After consideration of risks, benefits and other options for treatment, the patient has consented to  Procedure(s): [place] (Left) as a surgical intervention .  The patient's history has been reviewed, patient examined, no change in status, stable for surgery.  I have reviewed the patient's chart and labs.  Questions were answered to the patient's satisfaction.     TOTH III,PAUL S

## 2014-06-22 NOTE — Transfer of Care (Signed)
Immediate Anesthesia Transfer of Care Note  Patient: Stacy Diaz  Procedure(s) Performed: Procedure(s): LEFT WIRE LOCALIZED LUMPECTOPMY AND SENTINEL NODE MAPPING (Left)  Patient Location: PACU  Anesthesia Type:General  Level of Consciousness: awake, alert  and oriented  Airway & Oxygen Therapy: Patient connected to face mask oxygen  Post-op Assessment: Report given to PACU RN  Post vital signs: stable  Complications: No apparent anesthesia complications

## 2014-06-22 NOTE — Plan of Care (Signed)
Problem: Phase I Progression Outcomes Goal: Voiding-avoid urinary catheter unless indicated Outcome: Completed/Met Date Met:  06/22/14     

## 2014-06-22 NOTE — Op Note (Signed)
06/22/2014  12:21 PM  PATIENT:  Stacy Diaz  63 y.o. female  PRE-OPERATIVE DIAGNOSIS:  Left Breast Cancer  POST-OPERATIVE DIAGNOSIS:  Left Breast Cancer  PROCEDURE:  Procedure(s): LEFT WIRE LOCALIZED LUMPECTOPMY AND SENTINEL NODE MAPPING (Left)  SURGEON:  Surgeon(s) and Role:    * Jovita Kussmaul, MD - Primary  PHYSICIAN ASSISTANT:   ASSISTANTS: none   ANESTHESIA:   general  EBL:  Total I/O In: 1000 [I.V.:1000] Out: -   BLOOD ADMINISTERED:none  DRAINS: none   LOCAL MEDICATIONS USED:  MARCAINE     SPECIMEN:  Source of Specimen:  left breast tissue and sentinel nodes X 3  DISPOSITION OF SPECIMEN:  PATHOLOGY  COUNTS:  YES  TOURNIQUET:  * No tourniquets in log *  DICTATION: .Dragon Dictation  After informed consent was obtained the patient was brought to the operating room and placed in the supine position on the operating room table. After adequate induction of general anesthesia the patient's left chest, breast, and axillary areas were prepped with ChloraPrep, allowed to dry, and draped in usual sterile manner. Earlier in the day the patient underwent injection of 1 mCi of technetium sulfur colloid in the subareolar position on the left. A neoprobe device was used to identify a hot spot in the left axilla. A small transverse incision was made with a 15 blade knife overlying the area of radioactivity. This incision was carried through the skin and subcutaneous tissue sharply with electrocautery until the axilla was entered. A wheatland retractor was deployed. The neoprobe was used to direct blunt hemostat dissection until the hot lymph node was identified. We actually found 3 hot lymph nodes. Each was excised sharply with the electrocautery and some small vessels were controlled with clips. Ex vivo counts on these sentinel nodes ranged from [phone]. These were sent to pathology for further evaluation. The axilla was infiltrated with quarter percent Marcaine. The area was  examined and found to be hemostatic. The deep layer of the wound was closed with interrupted 3-0 Vicryl stitches. The skin was then closed with a running 4-0 Monocryl subcuticular stitch. Attention was then turned to the left breast. A radial incision was made in the lower portion of the left breast overlying the path of the wire. This incision was carried through the skin and subcutaneous tissue sharply with electrocautery. Once in the breast tissue the path of the wire could be palpated. A circular portion of breast tissue was excised sharply around the path of the wire with the electrocautery. This dissection was carried very close to the chest wall on the deep side. Once the specimen was removed it was oriented with the appropriate paint colors. A specimen radiograph showed the clip to be in the center of the specimen. The specimen was then sent to pathology for further evaluation. The breast tissue around the incision was mobilized away from the skin so that the breast tissue could be closed. This was done with interrupted 3-0 Vicryl stitches. The skin was then closed with interrupted 4-0 Monocryl subcuticular stitches. Dermabond dressings were then applied. The patient tolerated the procedure well. At the end of the case all needle sponge counts were correct. The patient was then awakened and taken to recovery in stable condition.  PLAN OF CARE: Admit for overnight observation  PATIENT DISPOSITION:  PACU - hemodynamically stable.   Delay start of Pharmacological VTE agent (>24hrs) due to surgical blood loss or risk of bleeding: no

## 2014-06-22 NOTE — Anesthesia Preprocedure Evaluation (Signed)
Anesthesia Evaluation  Patient identified by MRN, date of birth, ID band Patient awake    Reviewed: Allergy & Precautions, H&P , NPO status , Patient's Chart, lab work & pertinent test results  Airway Mallampati: II   Neck ROM: full    Dental   Pulmonary former smoker,          Cardiovascular hypertension, + Past MI     Neuro/Psych  Headaches, Seizures -,  Anxiety  Neuromuscular disease CVA    GI/Hepatic   Endo/Other  obese  Renal/GU      Musculoskeletal  (+) Arthritis -,   Abdominal   Peds  Hematology   Anesthesia Other Findings   Reproductive/Obstetrics                             Anesthesia Physical Anesthesia Plan  ASA: III  Anesthesia Plan: General   Post-op Pain Management:    Induction: Intravenous  Airway Management Planned: LMA  Additional Equipment:   Intra-op Plan:   Post-operative Plan:   Informed Consent: I have reviewed the patients History and Physical, chart, labs and discussed the procedure including the risks, benefits and alternatives for the proposed anesthesia with the patient or authorized representative who has indicated his/her understanding and acceptance.     Plan Discussed with: CRNA, Anesthesiologist and Surgeon  Anesthesia Plan Comments:         Anesthesia Quick Evaluation

## 2014-06-23 DIAGNOSIS — C50512 Malignant neoplasm of lower-outer quadrant of left female breast: Secondary | ICD-10-CM | POA: Diagnosis not present

## 2014-06-23 MED ORDER — OXYCODONE-ACETAMINOPHEN 5-325 MG PO TABS: 1.0000 | ORAL_TABLET | ORAL | Status: DC | PRN

## 2014-06-23 NOTE — Discharge Summary (Signed)
Physician Discharge Summary  Patient ID: Stacy Diaz MRN: [id number] DOB/AGE: 1951-01-13 63 y.o.  Admit date: 06/22/2014 Discharge date: 06/23/2014  Admission Diagnoses:  Discharge Diagnoses:  Active Problems:   Breast cancer, female   Discharged Condition: good  Hospital Course: the pt underwent left breast lumpectomy and sentinel node mapping. She tolerated surgery well. Because of her stroke and bleeding risk she was kept overnight. She did well and on pod 1 she was ready for discharge home  Consults: None  Significant Diagnostic Studies: none  Treatments: surgery: as above  Discharge Exam: Blood pressure 108/52, pulse 84, temperature 97.6 F (36.4 C), temperature source Oral, resp. rate 18, height 5\' 2"  (1.575 m), weight 170 lb (77.111 kg), SpO2 99 %. Breasts: incisions look good  Disposition: 01-Home or Self Care  Discharge Instructions    Call MD for:  difficulty breathing, headache or visual disturbances    Complete by:  As directed      Call MD for:  extreme fatigue    Complete by:  As directed      Call MD for:  hives    Complete by:  As directed      Call MD for:  persistant dizziness or light-headedness    Complete by:  As directed      Call MD for:  persistant nausea and vomiting    Complete by:  As directed      Call MD for:  redness, tenderness, or signs of infection (pain, swelling, redness, odor or green/yellow discharge around incision site)    Complete by:  As directed      Call MD for:  severe uncontrolled pain    Complete by:  As directed      Call MD for:  temperature >100.4    Complete by:  As directed      Diet - low sodium heart healthy    Complete by:  As directed      Discharge instructions    Complete by:  As directed   May shower. Diet and activity as tolerated. You may be most comfortable in a sports type bra     Increase activity slowly    Complete by:  As directed      No wound care    Complete by:  As directed              Medication List    TAKE these medications        aspirin 325 MG tablet  Take 325 mg by mouth daily.     atenolol-chlorthalidone 50-25 MG per tablet  Commonly known as:  TENORETIC  Take 1 tablet by mouth daily.     atorvastatin 10 MG tablet  Commonly known as:  LIPITOR  Take 1 tablet (10 mg total) by mouth daily.     baclofen 20 MG tablet  Commonly known as:  LIORESAL  Take 1 tablet (20 mg total) by mouth 4 (four) times daily.     baclofen 20 MG tablet  Commonly known as:  LIORESAL  TAKE 1 TABLET (20 MG TOTAL) BY MOUTH 4 (FOUR) TIMES DAILY.     baclofen 20 MG tablet  Commonly known as:  LIORESAL  TAKE 1 TABLET (20 MG TOTAL) BY MOUTH 4 (FOUR) TIMES DAILY.     clopidogrel 75 MG tablet  Commonly known as:  PLAVIX  Take 1 tablet (75 mg total) by mouth daily with breakfast.     diazepam 5 MG tablet  Commonly known as:  VALIUM  Take 1 tablet (5 mg total) by mouth at bedtime.     docusate sodium 100 MG capsule  Commonly known as:  COLACE  Take 100 mg by mouth 2 (two) times daily.     lacosamide 50 MG Tabs tablet  Commonly known as:  VIMPAT  Take 50 mg by mouth 2 (two) times daily.     levETIRAcetam 1000 MG tablet  Commonly known as:  KEPPRA  Take 1 tablet (1,000 mg total) by mouth 2 (two) times daily.     lidocaine 5 %  Commonly known as:  LIDODERM  Place 1 patch onto the skin daily as needed (back pain). Remove & Discard patch within 12 hours or as directed by MD     LORazepam 1 MG tablet  Commonly known as:  ATIVAN  Take 1 tablet (1 mg total) by mouth 2 (two) times daily as needed for anxiety.     Melatonin 3 MG Tabs  Take 1 tablet (3 mg total) by mouth at bedtime.     methocarbamol 500 MG tablet  Commonly known as:  ROBAXIN  Take 1 tablet (500 mg total) by mouth 3 (three) times daily.     multivitamin with minerals Tabs tablet  Take 1 tablet by mouth daily.     OVER THE COUNTER MEDICATION  Apply 1 application topically daily as needed (pain). Aspercreme  with 5% lidocaine     oxyCODONE-acetaminophen 10-325 MG per tablet  Commonly known as:  PERCOCET  Take 1 tablet by mouth 3 (three) times daily. scheduled     oxyCODONE-acetaminophen 5-325 MG per tablet  Commonly known as:  ROXICET  Take 1-2 tablets by mouth every 4 (four) hours as needed.     polyethylene glycol packet  Commonly known as:  MIRALAX / GLYCOLAX  Take 17 g by mouth daily as needed (for constipation). Mix with liquid and drink     potassium chloride 10 MEQ tablet  Commonly known as:  K-DUR,KLOR-CON  Take 10 mEq by mouth 2 (two) times daily.     zinc oxide 11.3 % Crea cream  Commonly known as:  BALMEX  Apply 1 application topically at bedtime as needed.           Follow-up Information    Follow up with Merrie Roof, MD In 2 weeks.   Specialty:  General Surgery   Contact information:   [address] [place] [address] [phone]       Signed: Merrie Roof 06/23/2014, 7:46 AM

## 2014-06-23 NOTE — Progress Notes (Signed)
1 Day Post-Op  Subjective: No complaints other than some soreness  Objective: Vital signs in last 24 hours: Temp:  [97.2 F (36.2 C)-98.8 F (37.1 C)] 97.6 F (36.4 C) (11/07 0552) Pulse Rate:  [55-90] 84 (11/07 0552) Resp:  [12-20] 18 (11/07 0552) BP: (108-154)/(52-84) 108/52 mmHg (11/07 0552) SpO2:  [95 %-100 %] 99 % (11/07 0552) Weight:  [170 lb (77.111 kg)] 170 lb (77.111 kg) (11/06 2106) Last BM Date: 06/19/14  Intake/Output from previous day: 11/06 0701 - 11/07 0700 In: 1800 [I.V.:1800] Out: -  Intake/Output this shift:    Resp: clear to auscultation bilaterally Breasts: incisions look good Cardio: regular rate and rhythm GI: soft, non-tender; bowel sounds normal; no masses,  no organomegaly  Lab Results:  No results for input(s): WBC, HGB, HCT, PLT in the last 72 hours. BMET No results for input(s): NA, K, CL, CO2, GLUCOSE, BUN, CREATININE, CALCIUM in the last 72 hours. PT/INR No results for input(s): LABPROT, INR in the last 72 hours. ABG No results for input(s): PHART, HCO3 in the last 72 hours.  Invalid input(s): PCO2, PO2  Studies/Results: Nm Sentinel Node Inj-no Rpt (breast)  06/22/2014   CLINICAL DATA: left breast cancer   Sulfur colloid was injected intradermally by the nuclear medicine  technologist for breast cancer sentinel node localization.     Anti-infectives: Anti-infectives    Start     Dose/Rate Route Frequency Ordered Stop   06/22/14 0600  ceFAZolin (ANCEF) IVPB 2 g/50 mL premix     2 g100 mL/hr over 30 Minutes Intravenous On call to O.R. 06/21/14 1436 06/22/14 1055      Assessment/Plan: s/p Procedure(s): LEFT WIRE LOCALIZED LUMPECTOPMY AND SENTINEL NODE MAPPING (Left) Discharge  LOS: 1 day    TOTH Diaz,Stacy Crisanto S 06/23/2014

## 2014-06-25 ENCOUNTER — Other Ambulatory Visit: Payer: Self-pay | Admitting: Family Medicine

## 2014-06-26 ENCOUNTER — Encounter (HOSPITAL_COMMUNITY): Payer: Self-pay | Admitting: General Surgery

## 2014-06-26 NOTE — Anesthesia Postprocedure Evaluation (Signed)
Anesthesia Post Note  Patient: Stacy Diaz  Procedure(s) Performed: Procedure(s) (LRB): LEFT WIRE LOCALIZED LUMPECTOPMY AND SENTINEL NODE MAPPING (Left)  Anesthesia type: General  Patient location: PACU  Post pain: Pain level controlled and Adequate analgesia  Post assessment: Post-op Vital signs reviewed, Patient's Cardiovascular Status Stable, Respiratory Function Stable, Patent Airway and Pain level controlled  Last Vitals:  Filed Vitals:   06/23/14 0943  BP: 125/71  Pulse:   Temp:   Resp:     Post vital signs: Reviewed and stable  Level of consciousness: awake, alert  and oriented  Complications: No apparent anesthesia complications

## 2014-07-02 ENCOUNTER — Telehealth: Payer: Self-pay | Admitting: Hematology and Oncology

## 2014-07-02 ENCOUNTER — Encounter: Payer: Self-pay | Admitting: *Deleted

## 2014-07-02 ENCOUNTER — Ambulatory Visit (HOSPITAL_BASED_OUTPATIENT_CLINIC_OR_DEPARTMENT_OTHER): Payer: Medicare Other | Admitting: Hematology and Oncology

## 2014-07-02 ENCOUNTER — Telehealth: Payer: Self-pay | Admitting: Nurse Practitioner

## 2014-07-02 VITALS — BP 159/76 | HR 76 | Temp 98.0°F | Resp 20 | Ht 62.0 in

## 2014-07-02 DIAGNOSIS — Z170 Estrogen receptor positive status [ER+]: Secondary | ICD-10-CM

## 2014-07-02 DIAGNOSIS — C50512 Malignant neoplasm of lower-outer quadrant of left female breast: Secondary | ICD-10-CM

## 2014-07-02 NOTE — Telephone Encounter (Signed)
I called back.  Got no answer.  Left message.  Provided info for PAP [phone].

## 2014-07-02 NOTE — Telephone Encounter (Signed)
per pof to sch pt appt-gave pt copy of sch °

## 2014-07-02 NOTE — Progress Notes (Signed)
Patient Care Team: Dorena Cookey, MD as PCP - [place] III, MD as Consulting Physician (General Surgery) Rulon Eisenmenger, MD as Consulting Physician (Hematology and Oncology) Marye Round, MD as Consulting Physician (Radiation Oncology)  DIAGNOSIS: Breast cancer of lower-outer quadrant of left female breast   Staging form: Breast, AJCC 7th Edition     Clinical: Stage IA (T1c, N0, cM0) - Unsigned       Staging comments: Staged at breast conference 05/30/14.      Pathologic: No stage assigned - Unsigned   SUMMARY OF ONCOLOGIC HISTORY:   Breast cancer of lower-outer quadrant of left female breast   05/21/2014 Mammogram Mammogram and Ultrasound: Left breast 2 cm complex cystic asymmetric abnormality   05/25/2014 Initial Diagnosis Invasive mammary cancer with lobular features with perineural invasion, grade 2, ER positive PR positive HER-2 negative, Ki-67 25%   06/22/2014 Surgery Breast lumpectomy: Invasive lobular carcinoma, grade 3, 2.7 cm, with LCIS, ER positive, PR positive, HER-2 negative, Ki-67 65%, T2, N0, M0 stage II A3 SLN negative    CHIEF COMPLIANT: followup after surgery for breast cancer  INTERVAL HISTORY: Stacy Diaz is a 63 year old Caucasian with above-mentioned history of invasive lobular cancer involving the left breast. She had lumpectomy and is here today to discuss the pathology report. She done very well from the surgery without any major complications.  REVIEW OF SYSTEMS:   Constitutional: Denies fevers, chills or abnormal weight loss Eyes: Denies blurriness of vision Ears, nose, mouth, throat, and face: Denies mucositis or sore throat Respiratory: Denies cough, dyspnea or wheezes Cardiovascular: Denies palpitation, chest discomfort or lower extremity swelling Gastrointestinal:  Denies nausea, heartburn or change in bowel habits Skin: Denies abnormal skin rashes Lymphatics: Denies new lymphadenopathy or easy bruising Neurological:Denies numbness,  tingling or new weaknesses Behavioral/Psych: Mood is stable, no new changes  All other systems were reviewed with the patient and are negative.  I have reviewed the past medical history, past surgical history, social history and family history with the patient and they are unchanged from previous note.  ALLERGIES:  has No Known Allergies.  MEDICATIONS:  Current Outpatient Prescriptions  Medication Sig Dispense Refill  . atenolol-chlorthalidone (TENORETIC) 50-25 MG per tablet Take 1 tablet by mouth daily.    Marland Kitchen atorvastatin (LIPITOR) 10 MG tablet Take 1 tablet (10 mg total) by mouth daily. 100 tablet 3  . baclofen (LIORESAL) 20 MG tablet TAKE 1 TABLET (20 MG TOTAL) BY MOUTH 4 (FOUR) TIMES DAILY. (Patient taking differently: TAKE 1 TABLET (20 MG TOTAL) BY MOUTH 3 (three) TIMES DAILY.) 360 tablet 2  . clopidogrel (PLAVIX) 75 MG tablet Take 1 tablet (75 mg total) by mouth daily with breakfast. 100 tablet 3  . diazepam (VALIUM) 5 MG tablet Take 1 tablet (5 mg total) by mouth at bedtime. 90 tablet 3  . docusate sodium (COLACE) 100 MG capsule Take 100 mg by mouth 2 (two) times daily.    Marland Kitchen lacosamide (VIMPAT) 50 MG TABS tablet Take 50 mg by mouth 2 (two) times daily.    Marland Kitchen lidocaine (LIDODERM) 5 % Place 1 patch onto the skin daily as needed (back pain). Remove & Discard patch within 12 hours or as directed by MD    . LORazepam (ATIVAN) 1 MG tablet TAKE 1 TABLET BY MOUTH TWICE DAILY AS NEEDED FOR ANXIETY 180 tablet 0  . Melatonin 3 MG TABS Take 1 tablet (3 mg total) by mouth at bedtime. 90 tablet 3  . methocarbamol (ROBAXIN) 500  MG tablet Take 1 tablet (500 mg total) by mouth 3 (three) times daily. 300 tablet 3  . Multiple Vitamin (MULTIVITAMIN WITH MINERALS) TABS tablet Take 1 tablet by mouth daily.    Marland Kitchen OVER THE COUNTER MEDICATION Apply 1 application topically daily as needed (pain). Aspercreme with 5% lidocaine    . oxyCODONE-acetaminophen (PERCOCET) 10-325 MG per tablet Take 1 tablet by mouth 3  (three) times daily. scheduled    . polyethylene glycol (MIRALAX / GLYCOLAX) packet Take 17 g by mouth daily as needed (for constipation). Mix with liquid and drink    . potassium chloride (K-DUR,KLOR-CON) 10 MEQ tablet Take 10 mEq by mouth 2 (two) times daily.    Marland Kitchen zinc oxide (BALMEX) 11.3 % CREA cream Apply 1 application topically at bedtime as needed.      No current facility-administered medications for this visit.    PHYSICAL EXAMINATION: ECOG PERFORMANCE STATUS: 1 - Symptomatic but completely ambulatory  Filed Vitals:   07/02/14 1514  BP: 159/76  Pulse: 76  Temp: 98 F (36.7 C)  Resp: 20   Filed Weights    GENERAL:alert, no distress and comfortable SKIN: skin color, texture, turgor are normal, no rashes or significant lesions EYES: normal, Conjunctiva are pink and non-injected, sclera clear OROPHARYNX:no exudate, no erythema and lips, buccal mucosa, and tongue normal  NECK: supple, thyroid normal size, non-tender, without nodularity LYMPH:  no palpable lymphadenopathy in the cervical, axillary or inguinal LUNGS: clear to auscultation and percussion with normal breathing effort HEART: regular rate & rhythm and no murmurs and no lower extremity edema ABDOMEN:abdomen soft, non-tender and normal bowel sounds Musculoskeletal:no cyanosis of digits and no clubbing  NEURO: alert & oriented x 3 with fluent speech, no focal motor/sensory deficits  LABORATORY DATA:  I have reviewed the data as listed   Chemistry      Component Value Date/Time   NA 142 06/14/2014 1253   NA 143 05/30/2014 1212   K 3.7 06/14/2014 1253   K 3.5 05/30/2014 1212   CL 100 06/14/2014 1253   CO2 25 06/14/2014 1253   CO2 28 05/30/2014 1212   BUN 15 06/14/2014 1253   BUN 14.6 05/30/2014 1212   CREATININE 0.57 06/14/2014 1253   CREATININE 0.7 05/30/2014 1212      Component Value Date/Time   CALCIUM 10.0 06/14/2014 1253   CALCIUM 9.9 05/30/2014 1212   ALKPHOS 75 05/30/2014 1212   ALKPHOS 63  07/17/2013 1142   AST 26 05/30/2014 1212   AST 24 07/17/2013 1142   ALT 34 05/30/2014 1212   ALT 29 07/17/2013 1142   BILITOT 0.49 05/30/2014 1212   BILITOT 0.6 07/17/2013 1142       Lab Results  Component Value Date   WBC 7.4 06/14/2014   HGB 15.2* 06/14/2014   HCT 46.5* 06/14/2014   MCV 91.4 06/14/2014   PLT 209 06/14/2014   NEUTROABS 3.6 05/30/2014     ASSESSMENT & PLAN:  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  Pathology counseling: I discussed with the patient and her family regarding the diagnosis and the final pathology report. I discussed with them that the tumor size was much bigger than expected as well as the fact that it was grade 3 and had a hiatal Ki-67 than originally from on the initial biopsy. But the good news is that the cancer  did not show any evidence of lymph node involvement.  Recommendation: Oncotype DX testing to assess risk of recurrence. I discussed with them that the Oncotype DX is a 20 1G has a determine risk of recurrence and to predict will benefit from chemotherapy. If the patient was found to be high risk I would offer her systemic chemotherapy. The patient was low risk or intermediate risk I would not recommend chemotherapy because it is an invasive lobular cancer which has low likelihood of response to chemotherapy. Patient understands this and will come back to see [place] in 2 weeks to followup on the results of the Oncotype DX test.  I discussed with them that the sequence of events would be chemotherapy if needed followed by radiation therapy followed by antiestrogen therapy with aromatase inhibitor    No orders of the defined types were placed in this encounter.   The patient has a good understanding of the overall plan. she agrees with it. She will call with any problems that may develop before her next  visit here.  I spent 20 minutes counseling the patient face to face. The total time spent in the appointment was 25 minutes and more than 50% was on counseling and review of test results    Rulon Eisenmenger, MD 07/02/2014 5:13 PM

## 2014-07-02 NOTE — Progress Notes (Signed)
Ordered oncotype on 06/27/14 per Dr. Lindi Adie. Faxed requisition to pathology and confirmed receipt with [facility].

## 2014-07-02 NOTE — Assessment & Plan Note (Signed)
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  Pathology counseling: I discussed with the patient and her family regarding the diagnosis and the final pathology report. I discussed with them that the tumor size was much bigger than expected as well as the fact that it was grade 3 and had a hiatal Ki-67 than originally from on the initial biopsy. But the good news is that the cancer did not show any evidence of lymph node involvement.  Recommendation: Oncotype DX testing to assess risk of recurrence. I discussed with them that the Oncotype DX is a 20 1G has a determine risk of recurrence and to predict will benefit from chemotherapy. If the patient was found to be high risk I would offer her systemic chemotherapy. The patient was low risk or intermediate risk I would not recommend chemotherapy because it is an invasive lobular cancer which has low likelihood of response to chemotherapy. Patient understands this and will come back to see [place] in 2 weeks to followup on the results of the Oncotype DX test.  I discussed with them that the sequence of events would be chemotherapy if needed followed by radiation therapy followed by antiestrogen therapy with aromatase inhibitor

## 2014-07-02 NOTE — Telephone Encounter (Signed)
Patient's spouse stated Rx refill request for lacosamide (VIMPAT) 50 MG TABS tablet will cost 400.00 out of pocket.  Will put patient in doughnut hole and questioning If we samples of medication?  Please call and advise.

## 2014-07-06 ENCOUNTER — Other Ambulatory Visit: Payer: Self-pay | Admitting: Hematology and Oncology

## 2014-07-06 ENCOUNTER — Encounter: Payer: Self-pay | Admitting: *Deleted

## 2014-07-06 DIAGNOSIS — C50512 Malignant neoplasm of lower-outer quadrant of left female breast: Secondary | ICD-10-CM

## 2014-07-06 NOTE — Progress Notes (Signed)
Received Oncotype Dx results of 16.  Gave copy to Dr. Lindi Adie and took a copy to HIM to scan.

## 2014-07-09 ENCOUNTER — Other Ambulatory Visit: Payer: Self-pay

## 2014-07-09 ENCOUNTER — Telehealth: Payer: Self-pay | Admitting: Neurology

## 2014-07-09 ENCOUNTER — Encounter (HOSPITAL_COMMUNITY): Payer: Self-pay

## 2014-07-09 NOTE — Telephone Encounter (Signed)
PAP applications can take a 2-4 weeks to be processed by the manufacturer.  I called back.  They are aware.  Would like to pick up samples while app is pending.

## 2014-07-09 NOTE — Telephone Encounter (Signed)
I called the patient to let them know their Samples of VimPat were ready for pickup. Patient's husband, Rosanne Wohlfarth will pick them up.

## 2014-07-09 NOTE — Telephone Encounter (Signed)
Patient's husband is calling to follow up on patient assistance for Vimpat. States forms were left at our office a week ago. Patient has medication only for today. Do we have any samples of Vimpat . Please call  and advise. Thank you.

## 2014-07-10 MED ORDER — LACOSAMIDE 50 MG PO TABS: 50.0000 mg | ORAL_TABLET | Freq: Two times a day (BID) | ORAL | Status: DC

## 2014-07-11 NOTE — Progress Notes (Signed)
Location of Breast :Left Breast    Histology per Pathology Report: Diagnosis 05/23/2014:Breast, left, needle core biopsy, mass - INVASIVE MAMMARY CARCINOMA.- PERINEURAL INVASION IS IDENTIFIED.  Receptor Status: ER( +), PR (  +), Her2-neu ( - no amplification KI-67:65%)  Did patient present with symptoms (if so, please note symptoms) or was this found on screening mammography?: found on initial screening mammogram   Past/Anticipated interventions by surgeon, if DTO:[id number] : 06/22/2014: 1. Lymph node, sentinel, biopsy, left axillary #1- ONE OF ONE LYMPH NODE NEGATIVE FOR CARCINOMA (0/1).- FATTY REPLACEMENT. 2. Lymph node, sentinel, biopsy, left axillary #2- ONE OF ONE LYMPH NODE NEGATIVE FOR CARCINOMA (0/1).3. Lymph node, sentinel, biopsy, left axillary #3- ONE OF ONE LYMPH NODE NEGATIVE FOR CARCINOMA (0/1). 4. Breast, lumpectomy, left- INVASIVE LOBULAR CARCINOMA, GRADE 3, SPANNING 2.7 CM. Dr. Eddie Dibbles Toth,III,MD  Past/Anticipated interventions by medical oncology, if any: Chemotherapy : seen 05/30/14,  Dr. Lindi Adie 07/02/14, Oncotype  DX result= Negative =16 next appt 07/19/14  Lymphedema issues, if any:   No  Pain issues, if any: incision well healed, inverted nipple, tendr and soreness at incision sites  SAFETY ISSUES:yes, in w/c electric paraplegic lower extremities  Prior radiation? Yes scalp,  1998 or 1999, Dr. Danny Lawless   Pacemaker/ICD? No  Possible current pregnancy? No  Is the patient on methotrexate? 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

## 2014-07-16 ENCOUNTER — Ambulatory Visit
Admission: RE | Admit: 2014-07-16 | Discharge: 2014-07-16 | Disposition: A | Discharge status: Home / Self Care | Payer: Medicare Other | Source: Ambulatory Visit | Attending: Radiation Oncology | Admitting: Radiation Oncology

## 2014-07-16 ENCOUNTER — Encounter: Payer: Self-pay | Admitting: Radiation Oncology

## 2014-07-16 VITALS — BP 126/113 | HR 70 | Temp 97.6°F | Resp 20 | Ht 62.0 in

## 2014-07-16 DIAGNOSIS — Z510 Encounter for antineoplastic radiation therapy: Secondary | ICD-10-CM | POA: Insufficient documentation

## 2014-07-16 DIAGNOSIS — C50512 Malignant neoplasm of lower-outer quadrant of left female breast: Secondary | ICD-10-CM | POA: Insufficient documentation

## 2014-07-16 DIAGNOSIS — Z7902 Long term (current) use of antithrombotics/antiplatelets: Secondary | ICD-10-CM | POA: Insufficient documentation

## 2014-07-16 DIAGNOSIS — Z79891 Long term (current) use of opiate analgesic: Secondary | ICD-10-CM | POA: Diagnosis not present

## 2014-07-16 NOTE — Progress Notes (Signed)
Please see the Nurse Progress Note in the MD Initial Consult Encounter for this patient. 

## 2014-07-17 ENCOUNTER — Other Ambulatory Visit: Payer: Medicare Other

## 2014-07-17 ENCOUNTER — Encounter: Payer: Self-pay | Admitting: Radiation Oncology

## 2014-07-17 NOTE — Progress Notes (Signed)
Radiation Oncology         [phone] ________________________________  Name: Stacy Diaz MRN: [id number]  Date: 07/16/2014  DOB: Aug 22, 1950  Follow-Up Visit Note  CC: Joycelyn Man, MD  Rulon Eisenmenger, MD  Diagnosis:    Breast cancer of lower-outer quadrant of left female breast   Staging form: Breast, AJCC 7th Edition     Clinical: Stage IA (T1c, N0, cM0) - Unsigned       Staging comments: Staged at breast conference 05/30/14.      Pathologic: Stage IIA (T2, N0, cM0) - Unsigned   Narrative:  The patient returns today for follow-up.  She has completed surgery and now presents for further discussion and coordination of an anticipated course of radiation treatment. She states that, overall, she is doing very well.  She is healing nicely after her surgery. This revealed an invasive lobular carcinoma measuring 2.7 cm. All of the lymph nodes were negative and negative margins were achieved. This was completed on 06/22/2014. The patient underwent an Oncotype test which returned as low risk. The patient therefore will not receive chemotherapy..                              ALLERGIES:  has No Known Allergies.  Meds: Current Outpatient Prescriptions  Medication Sig Dispense Refill  . atenolol-chlorthalidone (TENORETIC) 50-25 MG per tablet Take 1 tablet by mouth daily.    Marland Kitchen atorvastatin (LIPITOR) 10 MG tablet Take 1 tablet (10 mg total) by mouth daily. 100 tablet 3  . baclofen (LIORESAL) 20 MG tablet TAKE 1 TABLET (20 MG TOTAL) BY MOUTH 4 (FOUR) TIMES DAILY. (Patient taking differently: TAKE 1 TABLET (20 MG TOTAL) BY MOUTH 3 (three) TIMES DAILY.) 360 tablet 2  . clopidogrel (PLAVIX) 75 MG tablet Take 1 tablet (75 mg total) by mouth daily with breakfast. 100 tablet 3  . diazepam (VALIUM) 5 MG tablet Take 1 tablet (5 mg total) by mouth at bedtime. 90 tablet 3  . docusate sodium (COLACE) 100 MG capsule Take 100 mg by mouth 2 (two) times daily.    Marland Kitchen lacosamide (VIMPAT) 50 MG TABS  tablet Take 1 tablet (50 mg total) by mouth 2 (two) times daily. 180 tablet 3  . lidocaine (LIDODERM) 5 % Place 1 patch onto the skin daily as needed (back pain). Remove & Discard patch within 12 hours or as directed by MD    . LORazepam (ATIVAN) 1 MG tablet TAKE 1 TABLET BY MOUTH TWICE DAILY AS NEEDED FOR ANXIETY 180 tablet 0  . Melatonin 3 MG TABS Take 1 tablet (3 mg total) by mouth at bedtime. 90 tablet 3  . methocarbamol (ROBAXIN) 500 MG tablet Take 1 tablet (500 mg total) by mouth 3 (three) times daily. 300 tablet 3  . Multiple Vitamin (MULTIVITAMIN WITH MINERALS) TABS tablet Take 1 tablet by mouth daily.    Marland Kitchen OVER THE COUNTER MEDICATION Apply 1 application topically daily as needed (pain). Aspercreme with 5% lidocaine    . oxyCODONE-acetaminophen (PERCOCET) 10-325 MG per tablet Take 1 tablet by mouth 3 (three) times daily. scheduled    . polyethylene glycol (MIRALAX / GLYCOLAX) packet Take 17 g by mouth daily as needed (for constipation). Mix with liquid and drink    . potassium chloride (K-DUR,KLOR-CON) 10 MEQ tablet Take 10 mEq by mouth 2 (two) times daily.    Marland Kitchen zinc oxide (BALMEX) 11.3 % CREA cream Apply 1 application topically at  bedtime as needed.      No current facility-administered medications for this encounter.    Physical Findings: The patient is in no acute distress. Patient is alert and oriented.  height is 5\' 2"  (1.575 m). Her oral temperature is 97.6 F (36.4 C). Her blood pressure is 126/113 and her pulse is 70. Her respiration is 20. .     Lab Findings: Lab Results  Component Value Date   WBC 7.4 06/14/2014   HGB 15.2* 06/14/2014   HCT 46.5* 06/14/2014   MCV 91.4 06/14/2014   PLT 209 06/14/2014     Radiographic Findings: Nm Sentinel Node Inj-no Rpt (breast)  06/22/2014   CLINICAL DATA: left breast cancer   Sulfur colloid was injected intradermally by the nuclear medicine  technologist for breast cancer sentinel node localization.     Impression:    The  patient has completed surgery and will not receive chemotherapy. She is suitable to proceed with adjuvant radiotherapy at this time.  I once again discussed the rationale for radiation treatment in this setting. We discussed the potential benefit of radiation treatment as well as the possible side effects and risks. All of her questions were answered.  The patient wishes to proceed with radiation treatment.  Plan:  The patient will be scheduled for a simulation in the near future such that we can proceed with radiation treatment planning. I anticipate treating the patient to the left breast with tangent fields for approximately 4 weeks.  I spent 15 minutes with the patient today, the majority of which was spent counseling the patient on the diagnosis of cancer and coordinating care.  ------------------------------------------------  Jodelle Gross, MD, PhD

## 2014-07-19 ENCOUNTER — Encounter: Payer: Self-pay | Admitting: *Deleted

## 2014-07-19 ENCOUNTER — Telehealth: Payer: Self-pay | Admitting: Hematology and Oncology

## 2014-07-19 ENCOUNTER — Ambulatory Visit (HOSPITAL_BASED_OUTPATIENT_CLINIC_OR_DEPARTMENT_OTHER): Payer: Medicare Other | Admitting: Hematology and Oncology

## 2014-07-19 DIAGNOSIS — Z170 Estrogen receptor positive status [ER+]: Secondary | ICD-10-CM

## 2014-07-19 DIAGNOSIS — C50512 Malignant neoplasm of lower-outer quadrant of left female breast: Secondary | ICD-10-CM

## 2014-07-19 MED ORDER — ANASTROZOLE 1 MG PO TABS
1.0000 mg | ORAL_TABLET | Freq: Every day | ORAL | Status: DC
Start: 2014-07-19 — End: 2015-05-27

## 2014-07-19 NOTE — Telephone Encounter (Signed)
per pof to sch tp appt-gave pt copy of sch °

## 2014-07-19 NOTE — Progress Notes (Signed)
[place] Social Work  Clinical Social Work was referred by pt and husband to review and complete healthcare advance directives.  Clinical Social Worker met with patient and spouse, [name] in [place] office.  The patient designated spouse, [name] as their primary healthcare agent and did not designate a secondary agent.  Patient also completed healthcare living will.    Clinical Social Worker notarized documents and made copies for patient/family. Clinical Social Worker will send documents to medical records to be scanned into patient's chart. Clinical Social Worker encouraged patient/family to contact with any additional questions or concerns.   CSW also discussed Patient and [employer] resources, support group, Secondary school teacher and Animal nutritionist. Pt and husband were excited to learn about additional resources and are aware of how to contact CSW as needed for assistance.   Loren Racer, [employer] Worker [place]  [place] Phone: [phone] Fax: [phone]

## 2014-07-19 NOTE — Progress Notes (Signed)
Patient Care Team: Dorena Cookey, MD as PCP - Holbrook III, MD as Consulting Physician (General Surgery) Rulon Eisenmenger, MD as Consulting Physician (Hematology and Oncology) Marye Round, MD as Consulting Physician (Radiation Oncology)  DIAGNOSIS: Breast cancer of lower-outer quadrant of left female breast   Staging form: Breast, AJCC 7th Edition     Clinical: Stage IA (T1c, N0, cM0) - Unsigned       Staging comments: Staged at breast conference 05/30/14.      Pathologic: Stage IIA (T2, N0, cM0) - Unsigned   SUMMARY OF ONCOLOGIC HISTORY:   Breast cancer of lower-outer quadrant of left female breast   05/21/2014 Mammogram Mammogram and Ultrasound: Left breast 2 cm complex cystic asymmetric abnormality   05/25/2014 Initial Diagnosis Invasive mammary cancer with lobular features with perineural invasion, grade 2, ER positive PR positive HER-2 negative, Ki-67 25%   06/22/2014 Surgery Breast lumpectomy: Invasive lobular carcinoma, grade 3, 2.7 cm, with LCIS, ER positive, PR positive, HER-2 negative, Ki-67 65%, T2, N0, M0 stage II A3 SLN negative; oncotype 16 low risk 10% ROR    CHIEF COMPLIANT: followup to discuss Oncotype DX  INTERVAL HISTORY: Stacy Diaz is a 63 year old Caucasian lady with above-mentioned history of invasive lobular cancer treated with lumpectomy 06/22/2014. She had stage II disease with Oncotype DX recurrence score came back as low risk so she did not need chemotherapy. She is very excited to hear the results. She is scheduled to start radiation soon. Her breast is healing very well.  REVIEW OF SYSTEMS:   Constitutional: Denies fevers, chills or abnormal weight loss Eyes: Denies blurriness of vision Ears, nose, mouth, throat, and face: Denies mucositis or sore throat Respiratory: Denies cough, dyspnea or wheezes Cardiovascular: Denies palpitation, chest discomfort or lower extremity swelling Gastrointestinal:  Denies nausea, heartburn or change in bowel  habits Skin: Denies abnormal skin rashes Lymphatics: Denies new lymphadenopathy or easy bruising Neurological:Denies numbness, tingling or new weaknesses Behavioral/Psych: Mood is stable, no new changes  Breast:  denies any pain or lumps or nodules in either breasts All other systems were reviewed with the patient and are negative.  I have reviewed the past medical history, past surgical history, social history and family history with the patient and they are unchanged from previous note.  ALLERGIES:  has No Known Allergies.  MEDICATIONS:  Current Outpatient Prescriptions  Medication Sig Dispense Refill  . atenolol-chlorthalidone (TENORETIC) 50-25 MG per tablet Take 1 tablet by mouth daily.    Marland Kitchen atorvastatin (LIPITOR) 10 MG tablet Take 1 tablet (10 mg total) by mouth daily. 100 tablet 3  . baclofen (LIORESAL) 20 MG tablet TAKE 1 TABLET (20 MG TOTAL) BY MOUTH 4 (FOUR) TIMES DAILY. (Patient taking differently: TAKE 1 TABLET (20 MG TOTAL) BY MOUTH 3 (three) TIMES DAILY.) 360 tablet 2  . clopidogrel (PLAVIX) 75 MG tablet Take 1 tablet (75 mg total) by mouth daily with breakfast. 100 tablet 3  . diazepam (VALIUM) 5 MG tablet Take 1 tablet (5 mg total) by mouth at bedtime. 90 tablet 3  . docusate sodium (COLACE) 100 MG capsule Take 100 mg by mouth 2 (two) times daily.    Marland Kitchen lacosamide (VIMPAT) 50 MG TABS tablet Take 1 tablet (50 mg total) by mouth 2 (two) times daily. 180 tablet 3  . lidocaine (LIDODERM) 5 % Place 1 patch onto the skin daily as needed (back pain). Remove & Discard patch within 12 hours or as directed by MD    .  LORazepam (ATIVAN) 1 MG tablet TAKE 1 TABLET BY MOUTH TWICE DAILY AS NEEDED FOR ANXIETY 180 tablet 0  . Melatonin 3 MG TABS Take 1 tablet (3 mg total) by mouth at bedtime. 90 tablet 3  . methocarbamol (ROBAXIN) 500 MG tablet Take 1 tablet (500 mg total) by mouth 3 (three) times daily. 300 tablet 3  . Multiple Vitamin (MULTIVITAMIN WITH MINERALS) TABS tablet Take 1 tablet by  mouth daily.    Marland Kitchen OVER THE COUNTER MEDICATION Apply 1 application topically daily as needed (pain). Aspercreme with 5% lidocaine    . oxyCODONE-acetaminophen (PERCOCET) 10-325 MG per tablet Take 1 tablet by mouth 3 (three) times daily. scheduled    . polyethylene glycol (MIRALAX / GLYCOLAX) packet Take 17 g by mouth daily as needed (for constipation). Mix with liquid and drink    . potassium chloride (K-DUR,KLOR-CON) 10 MEQ tablet Take 10 mEq by mouth 2 (two) times daily.    Marland Kitchen zinc oxide (BALMEX) 11.3 % CREA cream Apply 1 application topically at bedtime as needed.     Marland Kitchen anastrozole (ARIMIDEX) 1 MG tablet Take 1 tablet (1 mg total) by mouth daily. 90 tablet 3   No current facility-administered medications for this visit.    PHYSICAL EXAMINATION: ECOG PERFORMANCE STATUS: 0 - Asymptomatic  Filed Vitals:   07/19/14 1309  BP: 135/70  Pulse: 65  Temp: 97.7 F (36.5 C)  Resp: 18   Filed Weights    GENERAL:alert, no distress and comfortable SKIN: skin color, texture, turgor are normal, no rashes or significant lesions EYES: normal, Conjunctiva are pink and non-injected, sclera clear OROPHARYNX:no exudate, no erythema and lips, buccal mucosa, and tongue normal  NECK: supple, thyroid normal size, non-tender, without nodularity LYMPH:  no palpable lymphadenopathy in the cervical, axillary or inguinal LUNGS: clear to auscultation and percussion with normal breathing effort HEART: regular rate & rhythm and no murmurs and no lower extremity edema ABDOMEN:abdomen soft, non-tender and normal bowel sounds Musculoskeletal:no cyanosis of digits and no clubbing  NEURO: uses wheelchair for ambulation  LABORATORY DATA:  I have reviewed the data as listed   Chemistry      Component Value Date/Time   NA 142 06/14/2014 1253   NA 143 05/30/2014 1212   K 3.7 06/14/2014 1253   K 3.5 05/30/2014 1212   CL 100 06/14/2014 1253   CO2 25 06/14/2014 1253   CO2 28 05/30/2014 1212   BUN 15 06/14/2014  1253   BUN 14.6 05/30/2014 1212   CREATININE 0.57 06/14/2014 1253   CREATININE 0.7 05/30/2014 1212      Component Value Date/Time   CALCIUM 10.0 06/14/2014 1253   CALCIUM 9.9 05/30/2014 1212   ALKPHOS 75 05/30/2014 1212   ALKPHOS 63 07/17/2013 1142   AST 26 05/30/2014 1212   AST 24 07/17/2013 1142   ALT 34 05/30/2014 1212   ALT 29 07/17/2013 1142   BILITOT 0.49 05/30/2014 1212   BILITOT 0.6 07/17/2013 1142       Lab Results  Component Value Date   WBC 7.4 06/14/2014   HGB 15.2* 06/14/2014   HCT 46.5* 06/14/2014   MCV 91.4 06/14/2014   PLT 209 06/14/2014   NEUTROABS 3.6 05/30/2014    ASSESSMENT & PLAN:  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 Oncotype DX recurrence score 16; 10% risk of recurrence, low risk did  not need chemotherapy  I discussed the results of Oncotype DX and provided him with a copy of the report. Given the fact that she is low risk, she did not need chemotherapy. She is an appointment with radiation oncology tomorrow for simulation and she'll hopefully start radiation therapy soon. 4 recent radiation is being planned.  Antiestrogen therapy: I discussed with him that after radiation is complete, she will need to start antiestrogen therapy with Arimidex 1 mg daily. I discussed the risks and benefits of aromatase inhibitors.These include but not limited to insomnia, hot flashes, mood changes, vaginal dryness, bone density loss, and weight gain. Although rare, serious side effects including endometrial cancer, risk of blood clots were also discussed. We strongly believe that the benefits far outweigh the risks. Patient understands these risks and consented to starting treatment. Planned treatment duration is 5 years.   Patient will likely start antiestrogen therapy in January. I will see her back in February for follow up  to assess toxicities.    No orders of the defined types were placed in this encounter.   The patient has a good understanding of the overall plan. she agrees with it. She will call with any problems that may develop before her next visit here.   Rulon Eisenmenger, MD 07/19/2014 2:03 PM

## 2014-07-19 NOTE — Assessment & Plan Note (Signed)
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 Oncotype DX recurrence score 16; 10% risk of recurrence, low risk did not need chemotherapy  I discussed the results of Oncotype DX and provided him with a copy of the report. Given the fact that she is low risk, she did not need chemotherapy. She is an appointment with radiation oncology tomorrow for simulation and she'll hopefully start radiation therapy soon. 4 recent radiation is being planned.  Antiestrogen therapy: I discussed with him that after radiation is complete, she will need to start antiestrogen therapy with Arimidex 1 mg daily. I discussed the risks and benefits of aromatase inhibitors.These include but not limited to insomnia, hot flashes, mood changes, vaginal dryness, bone density loss, and weight gain. Although rare, serious side effects including endometrial cancer, risk of blood clots were also discussed. We strongly believe that the benefits far outweigh the risks. Patient understands these risks and consented to starting treatment. Planned treatment duration is 5 years.   Patient will likely start antiestrogen therapy in January. I will see her back in February for follow up to assess toxicities.

## 2014-07-20 ENCOUNTER — Ambulatory Visit
Admission: RE | Admit: 2014-07-20 | Discharge: 2014-07-20 | Disposition: A | Discharge status: Home / Self Care | Payer: Medicare Other | Source: Ambulatory Visit | Attending: Radiation Oncology | Admitting: Radiation Oncology

## 2014-07-20 DIAGNOSIS — Z510 Encounter for antineoplastic radiation therapy: Secondary | ICD-10-CM | POA: Diagnosis not present

## 2014-07-20 DIAGNOSIS — C50512 Malignant neoplasm of lower-outer quadrant of left female breast: Secondary | ICD-10-CM

## 2014-07-23 NOTE — Telephone Encounter (Signed)
Pt's husband calling back to check on patient assistance with the Vimpat. I did let him know it could take 2-4 weeks, he states he called the manufacturer and they said it only takes 5 days.  Please call patient and advise.

## 2014-07-23 NOTE — Telephone Encounter (Signed)
I called the program, spoke with Verdis Frederickson.  She said they do have everything on file, however the application is still being processed.  She verified the enrollment can take 2-4 weeks to complete.  Says they should have an update tomorrow afternoon.  I called the patient back.  They are aware.

## 2014-07-24 ENCOUNTER — Encounter: Payer: Self-pay | Admitting: Family Medicine

## 2014-07-24 ENCOUNTER — Ambulatory Visit (INDEPENDENT_AMBULATORY_CARE_PROVIDER_SITE_OTHER): Payer: Medicare Other | Admitting: Family Medicine

## 2014-07-24 ENCOUNTER — Other Ambulatory Visit (HOSPITAL_COMMUNITY)
Admission: RE | Admit: 2014-07-24 | Discharge: 2014-07-24 | Disposition: A | Discharge status: Home / Self Care | Payer: Medicare Other | Source: Ambulatory Visit | Attending: Family Medicine | Admitting: Family Medicine

## 2014-07-24 VITALS — BP 120/84 | Temp 98.6°F

## 2014-07-24 DIAGNOSIS — I10 Essential (primary) hypertension: Secondary | ICD-10-CM

## 2014-07-24 DIAGNOSIS — Z01419 Encounter for gynecological examination (general) (routine) without abnormal findings: Secondary | ICD-10-CM | POA: Diagnosis present

## 2014-07-24 DIAGNOSIS — M62838 Other muscle spasm: Secondary | ICD-10-CM

## 2014-07-24 DIAGNOSIS — C50512 Malignant neoplasm of lower-outer quadrant of left female breast: Secondary | ICD-10-CM

## 2014-07-24 DIAGNOSIS — E785 Hyperlipidemia, unspecified: Secondary | ICD-10-CM

## 2014-07-24 DIAGNOSIS — G8320 Monoplegia of upper limb affecting unspecified side: Secondary | ICD-10-CM

## 2014-07-24 DIAGNOSIS — Z23 Encounter for immunization: Secondary | ICD-10-CM

## 2014-07-24 DIAGNOSIS — F411 Generalized anxiety disorder: Secondary | ICD-10-CM

## 2014-07-24 DIAGNOSIS — Z1151 Encounter for screening for human papillomavirus (HPV): Secondary | ICD-10-CM | POA: Insufficient documentation

## 2014-07-24 DIAGNOSIS — M6249 Contracture of muscle, multiple sites: Secondary | ICD-10-CM

## 2014-07-24 DIAGNOSIS — I25119 Atherosclerotic heart disease of native coronary artery with unspecified angina pectoris: Secondary | ICD-10-CM

## 2014-07-24 MED ORDER — POTASSIUM CHLORIDE CRYS ER 10 MEQ PO TBCR: 10.0000 meq | EXTENDED_RELEASE_TABLET | Freq: Two times a day (BID) | ORAL | Status: DC

## 2014-07-24 MED ORDER — DIAZEPAM 5 MG PO TABS: 5.0000 mg | ORAL_TABLET | Freq: Every day | ORAL | Status: DC

## 2014-07-24 MED ORDER — ATORVASTATIN CALCIUM 10 MG PO TABS: 10.0000 mg | ORAL_TABLET | Freq: Every day | ORAL | Status: DC

## 2014-07-24 MED ORDER — CLOPIDOGREL BISULFATE 75 MG PO TABS: 75.0000 mg | ORAL_TABLET | Freq: Every day | ORAL | Status: DC

## 2014-07-24 MED ORDER — METHOCARBAMOL 500 MG PO TABS: 500.0000 mg | ORAL_TABLET | Freq: Three times a day (TID) | ORAL | Status: DC

## 2014-07-24 MED ORDER — LORAZEPAM 1 MG PO TABS: 1.0000 mg | ORAL_TABLET | Freq: Two times a day (BID) | ORAL | Status: DC | PRN

## 2014-07-24 MED ORDER — ATENOLOL-CHLORTHALIDONE 50-25 MG PO TABS: 1.0000 | ORAL_TABLET | Freq: Every day | ORAL | Status: DC

## 2014-07-24 NOTE — Progress Notes (Signed)
Pre visit review using our clinic review tool, if applicable. No additional management support is needed unless otherwise documented below in the visit note. 

## 2014-07-24 NOTE — Patient Instructions (Signed)
Continue current medications  Follow-up in 1 year sooner if any problems  If you have any issues questions problems while on gone this summer........ call and leave a voicemail with Apolonio Schneiders extension [phone]

## 2014-07-24 NOTE — Telephone Encounter (Signed)
I called PAP back.  Spoke with Davina.  She said they are still processing the application.  Recommended we call back in a couple days to check status if needed.

## 2014-07-24 NOTE — Progress Notes (Signed)
   Subjective:    Patient ID: Stacy Diaz, female    DOB: 1950-10-21, 63 y.o.   MRN: [id number]  HPI [name] is a delightful 63 year old married female nonsmoker who comes in today for annual evaluation  She was hospitalized this fall for a lumpectomy. Genetic analysis turned out to be a low-grade cancer therefore she is going to have radiation therapy only followed by Arimidex. In the hospital she had a complete evaluation except for a Pap smear. She is postmenopausal with no symptoms. Never had an abnormal Pap. Last Pap was 5 years ago.  She takes Tenoretic 50-25 daily for hypertension BP normal  She takes Lipitor 10 mg daily for hyperlipidemia, Plavix 75 mg daily as a blood thinner, baclofen 20 mg 4 times daily because of spasticity. 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 She also takes Valium 5 mg at bedtime, Ativan 1 mg twice a day when necessary, Robaxin 500 mg 3 times daily, Percocet 10 mg-325 one tablet 3 times daily for chronic pain  Potassium supplement and MiraLAX daily.  Vaccinations reviewed she still flu shot but because of the impending radiation there is a question if we can give her a flu shot. I think we can but work on a have her check with her oncologist first   Review of Systems  Constitutional: Negative.   HENT: Negative.   Eyes: Negative.   Respiratory: Negative.   Cardiovascular: Negative.   Gastrointestinal: Negative.   Endocrine: Negative.   Genitourinary: Negative.   Musculoskeletal: Negative.   Skin: Negative.   Allergic/Immunologic: Negative.   Neurological: Negative.   Hematological: Negative.   Psychiatric/Behavioral: Negative.        Objective:   Physical Exam  Constitutional: She appears well-developed and well-nourished.  HENT:  Head: Normocephalic and atraumatic.  Right Ear: External ear normal.  Left Ear: External ear normal.  Nose: Nose normal.  Mouth/Throat: Oropharynx is clear and moist.  Eyes: EOM  are normal. Pupils are equal, round, and reactive to light.  Neck: Normal range of motion. Neck supple. No JVD present. No tracheal deviation present. No thyromegaly present.  Cardiovascular: Normal rate, regular rhythm, normal heart sounds and intact distal pulses.  Exam reveals no gallop and no friction rub.   No murmur heard. Pulmonary/Chest: Effort normal and breath sounds normal. No stridor. No respiratory distress. She has no wheezes. She has no rales. She exhibits no tenderness.  Abdominal: Soft. Bowel sounds are normal. She exhibits no distension and no mass. There is no tenderness. There is no rebound and no guarding.  Genitourinary: Vagina normal and uterus normal. No vaginal discharge found.  Musculoskeletal: Normal range of motion.  Lymphadenopathy:    She has no cervical adenopathy.  Neurological: She is alert. She has normal reflexes. No cranial nerve deficit. She exhibits normal muscle tone. Coordination normal.  Skin: Skin is warm and dry. No rash noted. No erythema. No pallor.  Psychiatric: She has a normal mood and affect. Her behavior is normal. Judgment and thought content normal.  Nursing note and vitals reviewed.         Assessment & Plan:  Hypertension at goal continue current therapy...  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  Chronic pain continue the Percocet and potassium supplements for hypokalemia  Recent lumpectomy for breast cancer..... Impending radiation treatments

## 2014-07-25 ENCOUNTER — Telehealth: Payer: Self-pay | Admitting: Family Medicine

## 2014-07-25 DIAGNOSIS — Z510 Encounter for antineoplastic radiation therapy: Secondary | ICD-10-CM | POA: Diagnosis not present

## 2014-07-25 LAB — CYTOLOGY - PAP

## 2014-07-25 NOTE — Telephone Encounter (Signed)
emmi emailed °

## 2014-07-26 DIAGNOSIS — Z510 Encounter for antineoplastic radiation therapy: Secondary | ICD-10-CM | POA: Diagnosis not present

## 2014-07-27 ENCOUNTER — Ambulatory Visit
Admission: RE | Admit: 2014-07-27 | Discharge: 2014-07-27 | Disposition: A | Discharge status: Home / Self Care | Payer: Medicare Other | Source: Ambulatory Visit | Attending: Radiation Oncology | Admitting: Radiation Oncology

## 2014-07-27 DIAGNOSIS — Z510 Encounter for antineoplastic radiation therapy: Secondary | ICD-10-CM | POA: Diagnosis not present

## 2014-07-27 NOTE — Telephone Encounter (Signed)
I called the patient to let them know their PAP for VimPat was ready for pickup. I spoke with Mr. Begue.  Patient was instructed to pick up at the front desk.

## 2014-07-30 ENCOUNTER — Ambulatory Visit
Admission: RE | Admit: 2014-07-30 | Discharge: 2014-07-30 | Disposition: A | Discharge status: Home / Self Care | Payer: Medicare Other | Source: Ambulatory Visit | Attending: Radiation Oncology | Admitting: Radiation Oncology

## 2014-07-30 DIAGNOSIS — Z510 Encounter for antineoplastic radiation therapy: Secondary | ICD-10-CM | POA: Diagnosis not present

## 2014-07-30 DIAGNOSIS — C50512 Malignant neoplasm of lower-outer quadrant of left female breast: Secondary | ICD-10-CM | POA: Diagnosis present

## 2014-07-30 MED ORDER — ALRA NON-METALLIC DEODORANT (RAD-ONC)
1.0000 "application " | Freq: Once | TOPICAL | Status: AC
  Administered 2014-07-30: 1 via TOPICAL

## 2014-07-30 MED ORDER — RADIAPLEXRX EX GEL
Freq: Once | CUTANEOUS | Status: AC
  Administered 2014-07-30: 16:00:00 via TOPICAL

## 2014-07-30 NOTE — Progress Notes (Signed)
Patient education done, radiaplex gel cream, alra deodorant, radiation therpay and you book, my business card given to pateint, discussed skin irritatin, soreness swelling in breast, fatigue, increase protein in diet,stay hydrated,drink plenty water, sees MD fridays after rad tx and prn,teach back given 3:51 PM

## 2014-07-31 ENCOUNTER — Ambulatory Visit
Admission: RE | Admit: 2014-07-31 | Discharge: 2014-07-31 | Disposition: A | Discharge status: Home / Self Care | Payer: Medicare Other | Source: Ambulatory Visit | Attending: Radiation Oncology | Admitting: Radiation Oncology

## 2014-07-31 DIAGNOSIS — Z510 Encounter for antineoplastic radiation therapy: Secondary | ICD-10-CM | POA: Diagnosis not present

## 2014-08-01 ENCOUNTER — Ambulatory Visit
Admission: RE | Admit: 2014-08-01 | Discharge: 2014-08-01 | Disposition: A | Discharge status: Home / Self Care | Payer: Medicare Other | Source: Ambulatory Visit | Attending: Radiation Oncology | Admitting: Radiation Oncology

## 2014-08-01 DIAGNOSIS — Z510 Encounter for antineoplastic radiation therapy: Secondary | ICD-10-CM | POA: Diagnosis not present

## 2014-08-02 ENCOUNTER — Ambulatory Visit
Admission: RE | Admit: 2014-08-02 | Discharge: 2014-08-02 | Disposition: A | Discharge status: Home / Self Care | Payer: Medicare Other | Source: Ambulatory Visit | Attending: Radiation Oncology | Admitting: Radiation Oncology

## 2014-08-02 DIAGNOSIS — Z510 Encounter for antineoplastic radiation therapy: Secondary | ICD-10-CM | POA: Diagnosis not present

## 2014-08-03 ENCOUNTER — Ambulatory Visit
Admission: RE | Admit: 2014-08-03 | Discharge: 2014-08-03 | Disposition: A | Discharge status: Home / Self Care | Payer: Medicare Other | Source: Ambulatory Visit | Attending: Radiation Oncology | Admitting: Radiation Oncology

## 2014-08-03 ENCOUNTER — Encounter: Payer: Self-pay | Admitting: Radiation Oncology

## 2014-08-03 VITALS — BP 125/74 | HR 71 | Temp 98.7°F | Resp 20

## 2014-08-03 DIAGNOSIS — C50512 Malignant neoplasm of lower-outer quadrant of left female breast: Secondary | ICD-10-CM

## 2014-08-03 DIAGNOSIS — Z510 Encounter for antineoplastic radiation therapy: Secondary | ICD-10-CM | POA: Diagnosis not present

## 2014-08-03 NOTE — Progress Notes (Signed)
Weekly rad txs Left Breast, 5 completed, mild pink on skin , skin intacty, inverted nipple not new, c/o sternum pain occasionaly started Tuesday after rad tx lasted about 35 seconds, appetite good, in electric w/c, no pain at present, was nauseated the other day had taken pain medication without eating, and c/o of being exhausted, using radiaplex bid 4:22 PM

## 2014-08-03 NOTE — Progress Notes (Signed)
  Radiation Oncology         [phone] ________________________________  Name: Stacy Diaz MRN: [id number]  Date: 07/20/2014  DOB: 10-Jun-1951  SIMULATION AND TREATMENT PLANNING NOTE  The patient presented for simulation prior to beginning her course of radiation treatment for her diagnosis of left-sided breast cancer. The patient was placed in a supine position on a breast board. A customized vac-lock bag was constructed and this complex treatment device will be used on a daily basis during her treatment. In this fashion, a CT scan was obtained through the chest area and an isocenter was placed near the chest wall within the breast.  A breath-hold technique was evaluated to determine if this helped spare the heart to reduce possible cardiac toxicity. This treatment technique was deemed to be helpful and this will be used on a daily basis during her treatment.  The patient will be planned to receive a course of radiation initially to a dose of 42.5 Gy. This will consist of a whole breast radiotherapy technique. To accomplish this, 2 customized blocks have been designed which will correspond to medial and lateral whole breast tangent fields. This treatment will be accomplished at 2.5 Gy per fraction. A forward planning technique will also be evaluated to determine if this approach improves the plan. It is anticipated that the patient will then receive a 7.5 Gy boost to the seroma cavity which has been contoured. This will be accomplished at 2.5 Gy per fraction.   This initial treatment will consist of a 3-D conformal technique. The seroma has been contoured as the primary target structure. Additionally, dose volume histograms of both this target as well as the lungs and heart will also be evaluated. Such an approach is necessary to ensure that the target area is adequately covered while the nearby critical  normal structures are adequately spared.  Plan:  The final anticipated total dose  therefore will correspond to 50 Gy.    _______________________________   Jodelle Gross, MD, PhD

## 2014-08-03 NOTE — Progress Notes (Signed)
Department of Radiation Oncology  Phone:  [phone] Fax:        [phone]  Weekly Treatment Note    Name: Stacy Diaz Date: 08/03/2014 MRN: [id number] DOB: [birth year]   Current dose: 12.5 Gy  Current fraction: 5   MEDICATIONS: Current Outpatient Prescriptions  Medication Sig Dispense Refill  . anastrozole (ARIMIDEX) 1 MG tablet Take 1 tablet (1 mg total) by mouth daily. 90 tablet 3  . atenolol-chlorthalidone (TENORETIC) 50-25 MG per tablet Take 1 tablet by mouth daily. 100 tablet 3  . atorvastatin (LIPITOR) 10 MG tablet Take 1 tablet (10 mg total) by mouth daily. 100 tablet 3  . baclofen (LIORESAL) 20 MG tablet TAKE 1 TABLET (20 MG TOTAL) BY MOUTH 4 (FOUR) TIMES DAILY. (Patient taking differently: TAKE 1 TABLET (20 MG TOTAL) BY MOUTH 3 (three) TIMES DAILY.) 360 tablet 2  . clopidogrel (PLAVIX) 75 MG tablet Take 1 tablet (75 mg total) by mouth daily with breakfast. 100 tablet 3  . diazepam (VALIUM) 5 MG tablet Take 1 tablet (5 mg total) by mouth at bedtime. 90 tablet 3  . docusate sodium (COLACE) 100 MG capsule Take 100 mg by mouth 2 (two) times daily.    . hyaluronate sodium (RADIAPLEXRX) GEL Apply 1 application topically 2 (two) times daily. Add to affected rad tx  To breast after rad tx and bedtime, and on weekends    . lacosamide (VIMPAT) 50 MG TABS tablet Take 1 tablet (50 mg total) by mouth 2 (two) times daily. 180 tablet 3  . lidocaine (LIDODERM) 5 % Place 1 patch onto the skin daily as needed (back pain). Remove & Discard patch within 12 hours or as directed by MD    . LORazepam (ATIVAN) 1 MG tablet Take 1 tablet (1 mg total) by mouth 2 (two) times daily as needed. for anxiety 180 tablet 2  . Melatonin 3 MG TABS Take 1 tablet (3 mg total) by mouth at bedtime. 90 tablet 3  . methocarbamol (ROBAXIN) 500 MG tablet Take 1 tablet (500 mg total) by mouth 3 (three) times daily. 300 tablet 3  . Multiple Vitamin (MULTIVITAMIN WITH MINERALS) TABS tablet Take 1 tablet by  mouth daily.    . non-metallic deodorant Jethro Poling) MISC Apply 1 application topically daily as needed.    Marland Kitchen OVER THE COUNTER MEDICATION Apply 1 application topically daily as needed (pain). Aspercreme with 5% lidocaine    . oxyCODONE-acetaminophen (PERCOCET) 10-325 MG per tablet Take 1 tablet by mouth 3 (three) times daily. scheduled    . polyethylene glycol (MIRALAX / GLYCOLAX) packet Take 17 g by mouth daily as needed (for constipation). Mix with liquid and drink    . potassium chloride (K-DUR,KLOR-CON) 10 MEQ tablet Take 1 tablet (10 mEq total) by mouth 2 (two) times daily. 100 tablet 3  . zinc oxide (BALMEX) 11.3 % CREA cream Apply 1 application topically at bedtime as needed.      No current facility-administered medications for this encounter.     ALLERGIES: Review of patient's allergies indicates no known allergies.   LABORATORY DATA:  Lab Results  Component Value Date   WBC 7.4 06/14/2014   HGB 15.2* 06/14/2014   HCT 46.5* 06/14/2014   MCV 91.4 06/14/2014   PLT 209 06/14/2014   Lab Results  Component Value Date   NA 142 06/14/2014   K 3.7 06/14/2014   CL 100 06/14/2014   CO2 25 06/14/2014   Lab Results  Component Value Date   ALT 34  05/30/2014   AST 26 05/30/2014   ALKPHOS 75 05/30/2014   BILITOT 0.49 05/30/2014     NARRATIVE: Stacy Diaz was seen today for weekly treatment management. The chart was checked and the patient's films were reviewed.  Weekly rad txs Left Breast, 5 completed, mild pink on skin , skin intacty, inverted nipple not new, c/o sternum pain occasionaly started Tuesday after rad tx lasted about 35 seconds, appetite good, in electric w/c, no pain at present, was nauseated the other day had taken pain medication without eating, and c/o of being exhausted, using radiaplex bid 4:54 PM   PHYSICAL EXAMINATION: oral temperature is 98.7 F (37.1 C). Her blood pressure is 125/74 and her pulse is 71. Her respiration is 20.        ASSESSMENT: The  patient is doing satisfactorily with treatment.  PLAN: We will continue with the patient's radiation treatment as planned.

## 2014-08-06 ENCOUNTER — Ambulatory Visit
Admission: RE | Admit: 2014-08-06 | Discharge: 2014-08-06 | Disposition: A | Discharge status: Home / Self Care | Payer: Medicare Other | Source: Ambulatory Visit | Attending: Radiation Oncology | Admitting: Radiation Oncology

## 2014-08-06 DIAGNOSIS — Z510 Encounter for antineoplastic radiation therapy: Secondary | ICD-10-CM | POA: Diagnosis not present

## 2014-08-07 ENCOUNTER — Ambulatory Visit
Admission: RE | Admit: 2014-08-07 | Discharge: 2014-08-07 | Disposition: A | Discharge status: Home / Self Care | Payer: Medicare Other | Source: Ambulatory Visit | Attending: Radiation Oncology | Admitting: Radiation Oncology

## 2014-08-07 ENCOUNTER — Telehealth: Payer: Self-pay | Admitting: Hematology and Oncology

## 2014-08-07 DIAGNOSIS — Z510 Encounter for antineoplastic radiation therapy: Secondary | ICD-10-CM | POA: Diagnosis not present

## 2014-08-07 NOTE — Telephone Encounter (Signed)
DUE CALL DAY 09/20/14 APPT WAS MOVED TO AM - CALL DAY HAS SINCE BEEN CXD AND APPT MOVED TO ORIGINAL TIME. S/W PT HUSBAND HE IS AWARE.

## 2014-08-08 ENCOUNTER — Encounter: Payer: Self-pay | Admitting: Radiation Oncology

## 2014-08-08 ENCOUNTER — Ambulatory Visit
Admission: RE | Admit: 2014-08-08 | Discharge: 2014-08-08 | Disposition: A | Discharge status: Home / Self Care | Payer: Medicare Other | Source: Ambulatory Visit | Attending: Radiation Oncology | Admitting: Radiation Oncology

## 2014-08-08 VITALS — BP 132/69 | HR 68 | Temp 98.0°F | Resp 16

## 2014-08-08 DIAGNOSIS — Z510 Encounter for antineoplastic radiation therapy: Secondary | ICD-10-CM | POA: Diagnosis not present

## 2014-08-08 DIAGNOSIS — C50512 Malignant neoplasm of lower-outer quadrant of left female breast: Secondary | ICD-10-CM

## 2014-08-08 NOTE — Progress Notes (Signed)
Department of Radiation Oncology  Phone:  [phone] Fax:        [phone]  Weekly Treatment Note    Name: Stacy Diaz Date: 08/08/2014 MRN: [id number] DOB: 1950/11/09   Current dose: 20 Gy  Current fraction: 8   MEDICATIONS: Current Outpatient Prescriptions  Medication Sig Dispense Refill  . anastrozole (ARIMIDEX) 1 MG tablet Take 1 tablet (1 mg total) by mouth daily. 90 tablet 3  . atenolol-chlorthalidone (TENORETIC) 50-25 MG per tablet Take 1 tablet by mouth daily. 100 tablet 3  . atorvastatin (LIPITOR) 10 MG tablet Take 1 tablet (10 mg total) by mouth daily. 100 tablet 3  . baclofen (LIORESAL) 20 MG tablet TAKE 1 TABLET (20 MG TOTAL) BY MOUTH 4 (FOUR) TIMES DAILY. (Patient taking differently: TAKE 1 TABLET (20 MG TOTAL) BY MOUTH 3 (three) TIMES DAILY.) 360 tablet 2  . Calcium Carbonate-Vitamin D (CALCIUM + D PO) Take 1 tablet by mouth daily. Calcium 400mg /300mg  vitamin d    . clopidogrel (PLAVIX) 75 MG tablet Take 1 tablet (75 mg total) by mouth daily with breakfast. 100 tablet 3  . diazepam (VALIUM) 5 MG tablet Take 1 tablet (5 mg total) by mouth at bedtime. 90 tablet 3  . docusate sodium (COLACE) 100 MG capsule Take 100 mg by mouth 2 (two) times daily.    . hyaluronate sodium (RADIAPLEXRX) GEL Apply 1 application topically 2 (two) times daily. Add to affected rad tx  To breast after rad tx and bedtime, and on weekends    . lacosamide (VIMPAT) 50 MG TABS tablet Take 1 tablet (50 mg total) by mouth 2 (two) times daily. 180 tablet 3  . lidocaine (LIDODERM) 5 % Place 1 patch onto the skin daily as needed (back pain). Remove & Discard patch within 12 hours or as directed by MD    . LORazepam (ATIVAN) 1 MG tablet Take 1 tablet (1 mg total) by mouth 2 (two) times daily as needed. for anxiety 180 tablet 2  . Melatonin 3 MG TABS Take 1 tablet (3 mg total) by mouth at bedtime. 90 tablet 3  . methocarbamol (ROBAXIN) 500 MG tablet Take 1 tablet (500 mg total) by mouth 3  (three) times daily. 300 tablet 3  . Multiple Vitamin (MULTIVITAMIN WITH MINERALS) TABS tablet Take 1 tablet by mouth daily.    . non-metallic deodorant Jethro Poling) MISC Apply 1 application topically daily as needed.    Marland Kitchen OVER THE COUNTER MEDICATION Apply 1 application topically daily as needed (pain). Aspercreme with 5% lidocaine    . oxyCODONE-acetaminophen (PERCOCET) 10-325 MG per tablet Take 1 tablet by mouth 3 (three) times daily. scheduled    . polyethylene glycol (MIRALAX / GLYCOLAX) packet Take 17 g by mouth daily as needed (for constipation). Mix with liquid and drink    . potassium chloride (K-DUR,KLOR-CON) 10 MEQ tablet Take 1 tablet (10 mEq total) by mouth 2 (two) times daily. 100 tablet 3  . zinc oxide (BALMEX) 11.3 % CREA cream Apply 1 application topically at bedtime as needed.      No current facility-administered medications for this encounter.     ALLERGIES: Review of patient's allergies indicates no known allergies.   LABORATORY DATA:  Lab Results  Component Value Date   WBC 7.4 06/14/2014   HGB 15.2* 06/14/2014   HCT 46.5* 06/14/2014   MCV 91.4 06/14/2014   PLT 209 06/14/2014   Lab Results  Component Value Date   NA 142 06/14/2014   K 3.7 06/14/2014  CL 100 06/14/2014   CO2 25 06/14/2014   Lab Results  Component Value Date   ALT 34 05/30/2014   AST 26 05/30/2014   ALKPHOS 75 05/30/2014   BILITOT 0.49 05/30/2014     NARRATIVE: Stacy Diaz was seen today for weekly treatment management. The chart was checked and the patient's films were reviewed.  Weekly rad txs left breast 7/17 completed, seen before treatment today will be 8th treatment, no skin changes, using radiaplex tid, appetite good, in electric w/c,  Started calcium 4oomg tab with 300mg  vitamin d daily now 3:57 PM   PHYSICAL EXAMINATION: oral temperature is 98 F (36.7 C). Her blood pressure is 132/69 and her pulse is 68. Her respiration is 16.        ASSESSMENT: The patient is doing  satisfactorily with treatment.  PLAN: We will continue with the patient's radiation treatment as planned.

## 2014-08-08 NOTE — Progress Notes (Signed)
Weekly rad txs left breast 7/17 completed, seen before treatment today will be 8th treatment, no skin changes, using radiaplex tid, appetite good, in electric w/c,  Started calcium 4oomg tab with 300mg  vitamin d daily now 3:43 PM

## 2014-08-09 ENCOUNTER — Ambulatory Visit
Admission: RE | Admit: 2014-08-09 | Discharge: 2014-08-09 | Disposition: A | Discharge status: Home / Self Care | Payer: Medicare Other | Source: Ambulatory Visit | Attending: Radiation Oncology | Admitting: Radiation Oncology

## 2014-08-09 DIAGNOSIS — Z510 Encounter for antineoplastic radiation therapy: Secondary | ICD-10-CM | POA: Diagnosis not present

## 2014-08-13 ENCOUNTER — Ambulatory Visit
Admission: RE | Admit: 2014-08-13 | Discharge: 2014-08-13 | Disposition: A | Discharge status: Home / Self Care | Payer: Medicare Other | Source: Ambulatory Visit | Attending: Radiation Oncology | Admitting: Radiation Oncology

## 2014-08-13 ENCOUNTER — Telehealth: Payer: Self-pay | Admitting: Family Medicine

## 2014-08-13 DIAGNOSIS — Z510 Encounter for antineoplastic radiation therapy: Secondary | ICD-10-CM | POA: Diagnosis not present

## 2014-08-13 NOTE — Telephone Encounter (Signed)
Pt request refill of the following  oxyCODONE-acetaminophen (PERCOCET) 10-325 MG per tablet   Phamacy:

## 2014-08-14 ENCOUNTER — Ambulatory Visit
Admission: RE | Admit: 2014-08-14 | Discharge: 2014-08-14 | Disposition: A | Discharge status: Home / Self Care | Payer: Medicare Other | Source: Ambulatory Visit | Attending: Radiation Oncology | Admitting: Radiation Oncology

## 2014-08-14 DIAGNOSIS — Z510 Encounter for antineoplastic radiation therapy: Secondary | ICD-10-CM | POA: Diagnosis not present

## 2014-08-15 ENCOUNTER — Ambulatory Visit
Admission: RE | Admit: 2014-08-15 | Discharge: 2014-08-15 | Disposition: A | Discharge status: Home / Self Care | Payer: Medicare Other | Source: Ambulatory Visit | Attending: Radiation Oncology | Admitting: Radiation Oncology

## 2014-08-15 ENCOUNTER — Encounter: Payer: Self-pay | Admitting: Radiation Oncology

## 2014-08-15 DIAGNOSIS — Z510 Encounter for antineoplastic radiation therapy: Secondary | ICD-10-CM | POA: Diagnosis not present

## 2014-08-15 NOTE — Progress Notes (Signed)
weekly rad txs left breast, 12 /20 completed,  In electric wheelchair,  Slight pink, start of dermatitis,  uses radiaplex tid, skin intact, , muscle pain in stermun at times stated, no c/o pain, nausea, appetite good,  4:24 PM

## 2014-08-16 ENCOUNTER — Ambulatory Visit
Admission: RE | Admit: 2014-08-16 | Discharge: 2014-08-16 | Disposition: A | Discharge status: Home / Self Care | Payer: Medicare Other | Source: Ambulatory Visit | Attending: Radiation Oncology | Admitting: Radiation Oncology

## 2014-08-16 DIAGNOSIS — Z510 Encounter for antineoplastic radiation therapy: Secondary | ICD-10-CM | POA: Diagnosis not present

## 2014-08-16 MED ORDER — OXYCODONE-ACETAMINOPHEN 10-325 MG PO TABS: 1.0000 | ORAL_TABLET | Freq: Three times a day (TID) | ORAL | Status: DC

## 2014-08-16 NOTE — Telephone Encounter (Signed)
rx ready for pick up and patient is aware  

## 2014-08-20 ENCOUNTER — Ambulatory Visit
Admission: RE | Admit: 2014-08-20 | Discharge: 2014-08-20 | Disposition: A | Discharge status: Home / Self Care | Payer: Medicare Other | Source: Ambulatory Visit | Attending: Radiation Oncology | Admitting: Radiation Oncology

## 2014-08-20 DIAGNOSIS — Z510 Encounter for antineoplastic radiation therapy: Secondary | ICD-10-CM | POA: Diagnosis present

## 2014-08-20 DIAGNOSIS — Z79891 Long term (current) use of opiate analgesic: Secondary | ICD-10-CM | POA: Diagnosis not present

## 2014-08-20 DIAGNOSIS — Z7902 Long term (current) use of antithrombotics/antiplatelets: Secondary | ICD-10-CM | POA: Diagnosis not present

## 2014-08-20 DIAGNOSIS — C50512 Malignant neoplasm of lower-outer quadrant of left female breast: Secondary | ICD-10-CM | POA: Diagnosis not present

## 2014-08-20 MED ORDER — RADIAPLEXRX EX GEL
Freq: Once | CUTANEOUS | Status: AC
  Administered 2014-08-20: 16:00:00 via TOPICAL

## 2014-08-21 ENCOUNTER — Ambulatory Visit: Payer: Medicare Other

## 2014-08-22 ENCOUNTER — Ambulatory Visit: Payer: Medicare Other

## 2014-08-22 DIAGNOSIS — Z510 Encounter for antineoplastic radiation therapy: Secondary | ICD-10-CM | POA: Diagnosis not present

## 2014-08-23 ENCOUNTER — Ambulatory Visit
Admission: RE | Admit: 2014-08-23 | Discharge: 2014-08-23 | Disposition: A | Discharge status: Home / Self Care | Payer: Medicare Other | Source: Ambulatory Visit | Attending: Radiation Oncology | Admitting: Radiation Oncology

## 2014-08-23 ENCOUNTER — Encounter: Payer: Self-pay | Admitting: Radiation Oncology

## 2014-08-23 DIAGNOSIS — Z510 Encounter for antineoplastic radiation therapy: Secondary | ICD-10-CM | POA: Diagnosis not present

## 2014-08-24 ENCOUNTER — Ambulatory Visit
Admission: RE | Admit: 2014-08-24 | Discharge: 2014-08-24 | Disposition: A | Discharge status: Home / Self Care | Payer: Medicare Other | Source: Ambulatory Visit | Attending: Radiation Oncology | Admitting: Radiation Oncology

## 2014-08-24 ENCOUNTER — Ambulatory Visit
Admission: RE | Admit: 2014-08-24 | Discharge: 2014-08-24 | Disposition: A | Discharge status: Home / Self Care | Payer: PPO | Source: Ambulatory Visit | Attending: Radiation Oncology | Admitting: Radiation Oncology

## 2014-08-24 ENCOUNTER — Ambulatory Visit: Payer: Medicare Other | Admitting: Radiation Oncology

## 2014-08-24 VITALS — BP 147/79 | HR 70 | Temp 98.1°F

## 2014-08-24 DIAGNOSIS — Z510 Encounter for antineoplastic radiation therapy: Secondary | ICD-10-CM | POA: Diagnosis not present

## 2014-08-24 DIAGNOSIS — C50512 Malignant neoplasm of lower-outer quadrant of left female breast: Secondary | ICD-10-CM

## 2014-08-24 NOTE — Progress Notes (Signed)
Department of Radiation Oncology  Phone:  [phone] Fax:        [phone]  Weekly Treatment Note    Name: Stacy Diaz Date: 08/24/2014 MRN: [id number] DOB: 03/25/1951   Current dose: 40 Gy  Current fraction:16   MEDICATIONS: Current Outpatient Prescriptions  Medication Sig Dispense Refill  . atenolol-chlorthalidone (TENORETIC) 50-25 MG per tablet Take 1 tablet by mouth daily. 100 tablet 3  . atorvastatin (LIPITOR) 10 MG tablet Take 1 tablet (10 mg total) by mouth daily. 100 tablet 3  . baclofen (LIORESAL) 20 MG tablet TAKE 1 TABLET (20 MG TOTAL) BY MOUTH 4 (FOUR) TIMES DAILY. (Patient taking differently: TAKE 1 TABLET (20 MG TOTAL) BY MOUTH 3 (three) TIMES DAILY.) 360 tablet 2  . Calcium Carbonate-Vitamin D (CALCIUM + D PO) Take 1 tablet by mouth daily. Calcium 400mg /300mg  vitamin d    . clopidogrel (PLAVIX) 75 MG tablet Take 1 tablet (75 mg total) by mouth daily with breakfast. 100 tablet 3  . diazepam (VALIUM) 5 MG tablet Take 1 tablet (5 mg total) by mouth at bedtime. 90 tablet 3  . docusate sodium (COLACE) 100 MG capsule Take 100 mg by mouth 2 (two) times daily.    . hyaluronate sodium (RADIAPLEXRX) GEL Apply 1 application topically 2 (two) times daily. Add to affected rad tx  To breast after rad tx and bedtime, and on weekends    . lacosamide (VIMPAT) 50 MG TABS tablet Take 1 tablet (50 mg total) by mouth 2 (two) times daily. 180 tablet 3  . lidocaine (LIDODERM) 5 % Place 1 patch onto the skin daily as needed (back pain). Remove & Discard patch within 12 hours or as directed by MD    . LORazepam (ATIVAN) 1 MG tablet Take 1 tablet (1 mg total) by mouth 2 (two) times daily as needed. for anxiety 180 tablet 2  . Melatonin 3 MG TABS Take 1 tablet (3 mg total) by mouth at bedtime. 90 tablet 3  . methocarbamol (ROBAXIN) 500 MG tablet Take 1 tablet (500 mg total) by mouth 3 (three) times daily. 300 tablet 3  . Multiple Vitamin (MULTIVITAMIN WITH MINERALS) TABS tablet Take  1 tablet by mouth daily.    . non-metallic deodorant Jethro Poling) MISC Apply 1 application topically daily as needed.    Marland Kitchen OVER THE COUNTER MEDICATION Apply 1 application topically daily as needed (pain). Aspercreme with 5% lidocaine    . oxyCODONE-acetaminophen (PERCOCET) 10-325 MG per tablet Take 1 tablet by mouth 3 (three) times daily. scheduled 180 tablet 0  . polyethylene glycol (MIRALAX / GLYCOLAX) packet Take 17 g by mouth daily as needed (for constipation). Mix with liquid and drink    . potassium chloride (K-DUR,KLOR-CON) 10 MEQ tablet Take 1 tablet (10 mEq total) by mouth 2 (two) times daily. 100 tablet 3  . zinc oxide (BALMEX) 11.3 % CREA cream Apply 1 application topically at bedtime as needed.     Marland Kitchen anastrozole (ARIMIDEX) 1 MG tablet Take 1 tablet (1 mg total) by mouth daily. (Patient not taking: Reported on 08/24/2014) 90 tablet 3   No current facility-administered medications for this encounter.     ALLERGIES: Review of patient's allergies indicates no known allergies.   LABORATORY DATA:  Lab Results  Component Value Date   WBC 7.4 06/14/2014   HGB 15.2* 06/14/2014   HCT 46.5* 06/14/2014   MCV 91.4 06/14/2014   PLT 209 06/14/2014   Lab Results  Component Value Date   NA 142 06/14/2014  K 3.7 06/14/2014   CL 100 06/14/2014   CO2 25 06/14/2014   Lab Results  Component Value Date   ALT 34 05/30/2014   AST 26 05/30/2014   ALKPHOS 75 05/30/2014   BILITOT 0.49 05/30/2014     NARRATIVE: Stacy Diaz was seen today for weekly treatment management. The chart was checked and the patient's films were reviewed.  Weekly assessment of radiation to left [email or web address] 16 fractions.skin is pink with increased soreness especially of areola.Informed that this is not unusual.continue application of radiaplex 2 to 3 times daily.reviewed when to start arimidex per Dr.Gudena's note after completion of radiation as she has follow up appointment February 2,2016.She will discuss with  Dr.Annye Forrey.  PHYSICAL EXAMINATION: temperature is 98.1 F (36.7 C). Her blood pressure is 147/79 and her pulse is 70.        ASSESSMENT: The patient is doing satisfactorily with treatment.  PLAN: We will continue with the patient's radiation treatment as planned.

## 2014-08-24 NOTE — Progress Notes (Signed)
Weekly assessment of radiation to left [email or web address] 16 fractions.skin is pink with increased soreness especially of areola.Informed that this is not unusual.continue application of radiaplex 2 to 3 times daily.reviewed when to start arimidex per Dr.Gudena's note after completion of radiation as she has follow up appointment February 2,2016.She will discuss with Dr.Moody.

## 2014-08-27 ENCOUNTER — Ambulatory Visit
Admission: RE | Admit: 2014-08-27 | Discharge: 2014-08-27 | Disposition: A | Discharge status: Home / Self Care | Payer: Medicare Other | Source: Ambulatory Visit | Attending: Radiation Oncology | Admitting: Radiation Oncology

## 2014-08-27 ENCOUNTER — Telehealth: Payer: Self-pay | Admitting: *Deleted

## 2014-08-27 DIAGNOSIS — Z510 Encounter for antineoplastic radiation therapy: Secondary | ICD-10-CM | POA: Diagnosis not present

## 2014-08-27 NOTE — Telephone Encounter (Signed)
We actually just received the approval letter in the mail today saying the patient was approved effective Aug 21, 2014.  They indicate in the letter that medication has been sent to the office, usually takes 7-10 days to arrive.  I called back.  Got no answer.  Left message.

## 2014-08-28 ENCOUNTER — Ambulatory Visit
Admission: RE | Admit: 2014-08-28 | Discharge: 2014-08-28 | Disposition: A | Discharge status: Home / Self Care | Payer: Medicare Other | Source: Ambulatory Visit | Attending: Radiation Oncology | Admitting: Radiation Oncology

## 2014-08-28 ENCOUNTER — Ambulatory Visit: Payer: Medicare Other

## 2014-08-28 DIAGNOSIS — Z510 Encounter for antineoplastic radiation therapy: Secondary | ICD-10-CM | POA: Diagnosis not present

## 2014-08-29 ENCOUNTER — Ambulatory Visit
Admission: RE | Admit: 2014-08-29 | Discharge: 2014-08-29 | Disposition: A | Discharge status: Home / Self Care | Payer: PPO | Source: Ambulatory Visit | Attending: Radiation Oncology | Admitting: Radiation Oncology

## 2014-08-29 ENCOUNTER — Ambulatory Visit
Admission: RE | Admit: 2014-08-29 | Discharge: 2014-08-29 | Disposition: A | Discharge status: Home / Self Care | Payer: Medicare Other | Source: Ambulatory Visit | Attending: Radiation Oncology | Admitting: Radiation Oncology

## 2014-08-29 DIAGNOSIS — Z510 Encounter for antineoplastic radiation therapy: Secondary | ICD-10-CM | POA: Diagnosis not present

## 2014-08-30 ENCOUNTER — Telehealth: Payer: Self-pay | Admitting: Neurology

## 2014-08-30 ENCOUNTER — Ambulatory Visit
Admission: RE | Admit: 2014-08-30 | Discharge: 2014-08-30 | Disposition: A | Discharge status: Home / Self Care | Payer: Medicare Other | Source: Ambulatory Visit | Attending: Radiation Oncology | Admitting: Radiation Oncology

## 2014-08-30 ENCOUNTER — Encounter: Payer: Self-pay | Admitting: Radiation Oncology

## 2014-08-30 ENCOUNTER — Ambulatory Visit: Payer: Medicare Other

## 2014-08-30 DIAGNOSIS — Z510 Encounter for antineoplastic radiation therapy: Secondary | ICD-10-CM | POA: Diagnosis not present

## 2014-08-30 NOTE — Telephone Encounter (Signed)
Stacy Diaz with [employer] Patient Asst. Program @ [phone], stated they are missing patient application form for Rx lacosamide (VIMPAT) 50 MG TABS tablet.  Checking status of form.  Please call and advise.

## 2014-08-30 NOTE — Telephone Encounter (Signed)
I called back.  Spoke with Davina.  She said they did speak with the patient today regarding some additional info they needed from the patient.  Says they have everything from our side, and does see the approval letter was sent dated Jan 5th.  They will call us back if anything further is needed.

## 2014-08-31 ENCOUNTER — Telehealth: Payer: Self-pay | Admitting: Family Medicine

## 2014-08-31 ENCOUNTER — Telehealth: Payer: Self-pay

## 2014-08-31 NOTE — Telephone Encounter (Signed)
Pt states she discussed getting a 90 day oxyCODONE-acetaminophen (PERCOCET) 10-325 MG per tablet when dr todd goes out so she will not be without. Would like a cb to discuss

## 2014-08-31 NOTE — Telephone Encounter (Signed)
LMOVM - returning pt call - she can start anastrazole immediately unless directed otherwise by RadOnc MD.  Pt to call clinic if she has any questions.

## 2014-09-03 NOTE — Telephone Encounter (Signed)
Patient had called left voice message ras on back of leg, back of both arms, and one small area on back, and on chest, had completed tx last week, to her breast,  took benadryl and cortaid and itching has stopped, asked if radiation has caused rash all over her, will start Arimidex this Friday, doubted radiation would cause all over rash, she will call back this Friday if not better and we will have her come in next week  2:35 PM

## 2014-09-04 MED ORDER — POTASSIUM CHLORIDE CRYS ER 10 MEQ PO TBCR: 10.0000 meq | EXTENDED_RELEASE_TABLET | Freq: Two times a day (BID) | ORAL | Status: DC

## 2014-09-04 NOTE — Telephone Encounter (Signed)
Spoke with husband and he will call back for 90 day refill.  potassium refill sent.  Patient was advised to stop one of her calcium tabs for the rash.

## 2014-09-12 NOTE — Telephone Encounter (Signed)
All info was previously faxed, but we have faxed it again to the emergency line.  I called back.  Patient is aware.

## 2014-09-12 NOTE — Telephone Encounter (Signed)
Patient's spouse called and stated [employer] Patient [employer] Program for Rx Vimpat, stated they're missing patient side of application which is on the back side of Physicians page.  [facility] requesting patient side of application sent to emergency fax # [phone] in order to get processed within 24 hours.  Spouse requesting a return call @ [phone] and advise.

## 2014-09-13 ENCOUNTER — Encounter: Payer: Self-pay | Admitting: Family Medicine

## 2014-09-13 ENCOUNTER — Ambulatory Visit (INDEPENDENT_AMBULATORY_CARE_PROVIDER_SITE_OTHER): Payer: PPO | Admitting: Family Medicine

## 2014-09-13 VITALS — BP 134/82 | Temp 97.6°F

## 2014-09-13 DIAGNOSIS — L738 Other specified follicular disorders: Secondary | ICD-10-CM

## 2014-09-13 MED ORDER — DOXYCYCLINE HYCLATE 100 MG PO TABS: 100.0000 mg | ORAL_TABLET | Freq: Two times a day (BID) | ORAL | Status: DC

## 2014-09-13 MED ORDER — SULFAMETHOXAZOLE-TRIMETHOPRIM 400-80 MG PO TABS: 1.0000 | ORAL_TABLET | Freq: Two times a day (BID) | ORAL | Status: DC

## 2014-09-13 NOTE — Progress Notes (Signed)
Complex simulation note  Diagnosis: left-sided breast cancer  Narrative The patient has initially been planned to receive a course of whole breast radiation to a dose of 42.5 Gy in 17 fractions. The patient will now receive an additional boost to the seroma cavity which has been contoured. This will correspond to a boost of 7.5 Gy at 2.5 Gy per fraction. To accomplish this, an additional 3 customized blocks have been designed for this purpose. A complex isodose plan is requested to ensure that the target area is adequately covered with radiation dose and that the nearby normal structures such as the lung are adequately spared. The patient's final total dose will be 50 Gy.  ------------------------------------------------  Stacy Diaz S. Signe Tackitt, MD, PhD 

## 2014-09-13 NOTE — Patient Instructions (Signed)
Doxycycline and Septra........ one of each twice daily for 10 days refills 2  Clean daily........ applied the moisturizing cream and the OTC steroid cream to the patches of eczema and dry skin  Return when necessary

## 2014-09-13 NOTE — Progress Notes (Signed)
  Radiation Oncology         [phone] ________________________________  Name: Stacy Diaz MRN: [id number]  Date: 08/30/2014  DOB: Jan 01, 1951  End of Treatment Note  Diagnosis:   left-sided breast cancer     Indication for treatment:  Curative       Radiation treatment dates:   07/30/14 - 08/30/2013  Site/dose:   The patient initially received a dose of 42.5 Gy in 17 fractions to the left left breast using whole-breast tangent fields. This was delivered using a 3-D conformal technique. The patient then received a boost to the seroma. This delivered an additional 7.5 Gy in 3 fractions using a 3 field photon technique due to the depth of the seroma. The total dose was 50 Gy.  Narrative: The patient tolerated radiation treatment relatively well.   The patient had some expected skin irritation as she progressed during treatment. Moist desquamation was not present at the end of treatment.  Plan: The patient has completed radiation treatment. The patient will return to radiation oncology clinic for routine followup in one month. I advised the patient to call or return sooner if they have any questions or concerns related to their recovery or treatment. ________________________________  Jodelle Gross, M.D., Ph.D.

## 2014-09-13 NOTE — Progress Notes (Signed)
Pre visit review using our clinic review tool, if applicable. No additional management support is needed unless otherwise documented below in the visit note. 

## 2014-09-13 NOTE — Progress Notes (Signed)
   Subjective:    Patient ID: Stacy Diaz, female    DOB: 20-May-1951, 64 y.o.   MRN: [id number]  HPI Stacy Diaz is a 64 year old female married nonsmoker who comes in today for evaluation of his skin rash in the posterior portion of both knees. She's had this for a couple months. They've been using antibiotic ointment the been on heal. She also has dry skin   Review of Systems Review of systems otherwise negative    Objective:   Physical Exam  Well-developed well-nourished female no acute distress vital signs stable she's afebrile examination skin shows multiple small pustules posterior portion right left knee also very dry skin      Assessment & Plan:  Folliculitis,,,,,,,,,,,,, [facility]cer protocol also use antibiotic ointment when necessary

## 2014-09-14 ENCOUNTER — Telehealth: Payer: Self-pay | Admitting: *Deleted

## 2014-09-14 NOTE — Telephone Encounter (Signed)
Stacy Diaz [phone])  faxed a note wanting to know if Dr Sherren Mocha would like to have the patient stop Potassium while taking Bactrim or continue Potassium during treatment?

## 2014-09-14 NOTE — Telephone Encounter (Signed)
Yes per Dr Stacy Diaz.  Pharmacy is aware.

## 2014-09-18 ENCOUNTER — Telehealth: Payer: Self-pay

## 2014-09-18 NOTE — Telephone Encounter (Signed)
Called patient. Spoke to spouse. Advised medication available for pick up at front desk.

## 2014-09-20 ENCOUNTER — Telehealth: Payer: Self-pay | Admitting: Hematology and Oncology

## 2014-09-20 ENCOUNTER — Ambulatory Visit (HOSPITAL_BASED_OUTPATIENT_CLINIC_OR_DEPARTMENT_OTHER): Payer: PPO | Admitting: Hematology and Oncology

## 2014-09-20 VITALS — BP 141/71 | HR 64 | Temp 98.4°F | Resp 18

## 2014-09-20 DIAGNOSIS — Z170 Estrogen receptor positive status [ER+]: Secondary | ICD-10-CM

## 2014-09-20 DIAGNOSIS — L03119 Cellulitis of unspecified part of limb: Secondary | ICD-10-CM

## 2014-09-20 DIAGNOSIS — C50512 Malignant neoplasm of lower-outer quadrant of left female breast: Secondary | ICD-10-CM

## 2014-09-20 NOTE — Assessment & Plan Note (Signed)
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 Oncotype DX recurrence score 16; 10% risk of recurrence, low risk did not need chemotherapy status post radiation therapy completed January 2016, started antiestrogen therapy with Arimidex 1 mg daily 09/07/2014  Arimidex toxicities:no major side effects from treatment. Patient has chronic muscle stiffness and aches which are unchanged. Denies any hot flashes.  Cellulitis underneath the leg: Currently on 2 antibiotics it is suspected to be related to a spider bite.  Return to clinic in 3 months for follow-up if she does well then we can see her every 6 months subsequently.

## 2014-09-20 NOTE — Progress Notes (Signed)
Patient Care Team: Dorena Cookey, MD as PCP - La Liga III, MD as Consulting Physician (General Surgery) Rulon Eisenmenger, MD as Consulting Physician (Hematology and Oncology) Jodelle Gross, MD as Consulting Physician (Radiation Oncology)  DIAGNOSIS: Breast cancer of lower-outer quadrant of left female breast   Staging form: Breast, AJCC 7th Edition     Clinical: Stage IA (T1c, N0, cM0) - Unsigned       Staging comments: Staged at breast conference 05/30/14.      Pathologic: Stage IIA (T2, N0, cM0) - Unsigned   SUMMARY OF ONCOLOGIC HISTORY:   Breast cancer of lower-outer quadrant of left female breast   05/21/2014 Mammogram Mammogram and Ultrasound: Left breast 2 cm complex cystic asymmetric abnormality   05/25/2014 Initial Diagnosis Invasive mammary cancer with lobular features with perineural invasion, grade 2, ER positive PR positive HER-2 negative, Ki-67 25%   06/22/2014 Surgery Breast lumpectomy: Invasive lobular carcinoma, grade 3, 2.7 cm, with LCIS, ER positive, PR positive, HER-2 negative, Ki-67 65%, T2, N0, M0 stage II A3 SLN negative; oncotype 16 low risk 10% ROR   07/30/2014 - 08/28/2014 Radiation Therapy Adjuvant radiation   09/07/2014 -  Anti-estrogen oral therapy Anastrazole 1 mg daily    CHIEF COMPLIANT: follow-up on anastrozole  INTERVAL HISTORY: Stacy Diaz is a 64 year old lady with above-mentioned history of left-sided breast cancer treated with lumpectomy followed by radiation and is starting anastrozole 1 mg daily from 09/07/2014. She has been tolerating it fairly well. Recently she was diagnosed with a spider bite and cellulitis and infection underneath the leg. She is on 2 [place] currently. It appears that it's not healing very quickly. Apart from that she has no other new complaints or problems taking anastrozole.  REVIEW OF SYSTEMS:   Constitutional: Denies fevers, chills or abnormal weight loss Eyes: Denies blurriness of vision Ears, nose, mouth,  throat, and face: Denies mucositis or sore throat Respiratory: Denies cough, dyspnea or wheezes Cardiovascular: Denies palpitation, chest discomfort or lower extremity swelling Gastrointestinal:  Denies nausea, heartburn or change in bowel habits Skin: rash and cellulitis underneath the leg Lymphatics: Denies new lymphadenopathy or easy bruising Neurological:prior stroke Behavioral/Psych: Mood is stable, no new changes  All other systems were reviewed with the patient and are negative.  I have reviewed the past medical history, past surgical history, social history and family history with the patient and they are unchanged from previous note.  ALLERGIES:  has No Known Allergies.  MEDICATIONS:  Current Outpatient Prescriptions  Medication Sig Dispense Refill  . anastrozole (ARIMIDEX) 1 MG tablet Take 1 tablet (1 mg total) by mouth daily. 90 tablet 3  . atenolol-chlorthalidone (TENORETIC) 50-25 MG per tablet Take 1 tablet by mouth daily. 100 tablet 3  . atorvastatin (LIPITOR) 10 MG tablet Take 1 tablet (10 mg total) by mouth daily. 100 tablet 3  . baclofen (LIORESAL) 20 MG tablet TAKE 1 TABLET (20 MG TOTAL) BY MOUTH 4 (FOUR) TIMES DAILY. (Patient taking differently: TAKE 1 TABLET (20 MG TOTAL) BY MOUTH 3 (three) TIMES DAILY.) 360 tablet 2  . Calcium Carbonate-Vitamin D (CALCIUM + D PO) Take 1 tablet by mouth daily. Calcium 480m/300mg vitamin d    . clopidogrel (PLAVIX) 75 MG tablet Take 1 tablet (75 mg total) by mouth daily with breakfast. 100 tablet 3  . diazepam (VALIUM) 5 MG tablet Take 1 tablet (5 mg total) by mouth at bedtime. 90 tablet 3  . docusate sodium (COLACE) 100 MG capsule Take 100 mg  by mouth 2 (two) times daily.    Marland Kitchen doxycycline (VIBRA-TABS) 100 MG tablet Take 1 tablet (100 mg total) by mouth 2 (two) times daily. 20 tablet 2  . hyaluronate sodium (RADIAPLEXRX) GEL Apply 1 application topically 2 (two) times daily. Add to affected rad tx  To breast after rad tx and bedtime, and  on weekends    . lacosamide (VIMPAT) 50 MG TABS tablet Take 1 tablet (50 mg total) by mouth 2 (two) times daily. 180 tablet 3  . lidocaine (LIDODERM) 5 % Place 1 patch onto the skin daily as needed (back pain). Remove & Discard patch within 12 hours or as directed by MD    . LORazepam (ATIVAN) 1 MG tablet Take 1 tablet (1 mg total) by mouth 2 (two) times daily as needed. for anxiety 180 tablet 2  . Melatonin 3 MG TABS Take 1 tablet (3 mg total) by mouth at bedtime. 90 tablet 3  . methocarbamol (ROBAXIN) 500 MG tablet Take 1 tablet (500 mg total) by mouth 3 (three) times daily. 300 tablet 3  . Multiple Vitamin (MULTIVITAMIN WITH MINERALS) TABS tablet Take 1 tablet by mouth daily.    Marland Kitchen OVER THE COUNTER MEDICATION Apply 1 application topically daily as needed (pain). Aspercreme with 5% lidocaine    . oxyCODONE-acetaminophen (PERCOCET) 10-325 MG per tablet Take 1 tablet by mouth 3 (three) times daily. scheduled 180 tablet 0  . polyethylene glycol (MIRALAX / GLYCOLAX) packet Take 17 g by mouth daily as needed (for constipation). Mix with liquid and drink    . potassium chloride (K-DUR,KLOR-CON) 10 MEQ tablet Take 1 tablet (10 mEq total) by mouth 2 (two) times daily. 100 tablet 3  . sulfamethoxazole-trimethoprim (BACTRIM) 400-80 MG per tablet Take 1 tablet by mouth 2 (two) times daily. 20 tablet 2  . zinc oxide (BALMEX) 11.3 % CREA cream Apply 1 application topically at bedtime as needed.      No current facility-administered medications for this visit.    PHYSICAL EXAMINATION: ECOG PERFORMANCE STATUS: 2 - Symptomatic, <50% confined to bed  Filed Vitals:   09/20/14 1340  BP: 141/71  Pulse: 64  Temp: 98.4 F (36.9 C)  Resp: 18   Filed Weights    GENERAL:alert, no distress and comfortable SKIN: skin color, texture, turgor are normal, no rashes or significant lesions EYES: normal, Conjunctiva are pink and non-injected, sclera clear OROPHARYNX:no exudate, no erythema and lips, buccal mucosa,  and tongue normal  NECK: supple, thyroid normal size, non-tender, without nodularity LYMPH:  no palpable lymphadenopathy in the cervical, axillary or inguinal LUNGS: clear to auscultation and percussion with normal breathing effort HEART: regular rate & rhythm and no murmurs and no lower extremity edema ABDOMEN:abdomen soft, non-tender and normal bowel sounds Musculoskeletal:no cyanosis of digits and no clubbing  NEURO: alert & oriented x 3 with fluent speech, prior stroke  LABORATORY DATA:  I have reviewed the data as listed   Chemistry      Component Value Date/Time   NA 142 06/14/2014 1253   NA 143 05/30/2014 1212   K 3.7 06/14/2014 1253   K 3.5 05/30/2014 1212   CL 100 06/14/2014 1253   CO2 25 06/14/2014 1253   CO2 28 05/30/2014 1212   BUN 15 06/14/2014 1253   BUN 14.6 05/30/2014 1212   CREATININE 0.57 06/14/2014 1253   CREATININE 0.7 05/30/2014 1212      Component Value Date/Time   CALCIUM 10.0 06/14/2014 1253   CALCIUM 9.9 05/30/2014 1212  ALKPHOS 75 05/30/2014 1212   ALKPHOS 63 07/17/2013 1142   AST 26 05/30/2014 1212   AST 24 07/17/2013 1142   ALT 34 05/30/2014 1212   ALT 29 07/17/2013 1142   BILITOT 0.49 05/30/2014 1212   BILITOT 0.6 07/17/2013 1142       Lab Results  Component Value Date   WBC 7.4 06/14/2014   HGB 15.2* 06/14/2014   HCT 46.5* 06/14/2014   MCV 91.4 06/14/2014   PLT 209 06/14/2014   NEUTROABS 3.6 05/30/2014   ASSESSMENT & PLAN:  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 Oncotype DX recurrence score 16; 10% risk of recurrence, low risk did not need chemotherapy status post radiation therapy completed January 2016, started antiestrogen therapy with Arimidex 1 mg daily 09/07/2014  Arimidex toxicities:no major side effects from treatment. Patient has chronic muscle stiffness and aches which are  unchanged. Denies any hot flashes.  Cellulitis underneath the leg: Currently on 2 antibiotics it is suspected to be related to a spider bite.  Return to clinic in 3 months for follow-up if she does well then we can see her every 6 months subsequently.    No orders of the defined types were placed in this encounter.   The patient has a good understanding of the overall plan. she agrees with it. She will call with any problems that may develop before her next visit here.   Rulon Eisenmenger, MD

## 2014-09-20 NOTE — Telephone Encounter (Signed)
per pof to sch pt appt-gave pt copy of sch °

## 2014-09-24 ENCOUNTER — Other Ambulatory Visit: Payer: Self-pay | Admitting: Family Medicine

## 2014-10-09 ENCOUNTER — Telehealth: Payer: Self-pay | Admitting: *Deleted

## 2014-10-09 ENCOUNTER — Encounter: Payer: Self-pay | Admitting: Radiation Oncology

## 2014-10-09 DIAGNOSIS — R21 Rash and other nonspecific skin eruption: Secondary | ICD-10-CM

## 2014-10-09 MED ORDER — OXYCODONE-ACETAMINOPHEN 10-325 MG PO TABS: 1.0000 | ORAL_TABLET | Freq: Three times a day (TID) | ORAL | Status: DC

## 2014-10-09 MED ORDER — POTASSIUM CHLORIDE CRYS ER 10 MEQ PO TBCR: 10.0000 meq | EXTENDED_RELEASE_TABLET | Freq: Two times a day (BID) | ORAL | Status: DC

## 2014-10-09 NOTE — Telephone Encounter (Signed)
Husband is calling because the patient 's rash has improved but not gone.  Should she go to a dermatologist?

## 2014-10-09 NOTE — Telephone Encounter (Signed)
Referral placed.

## 2014-10-11 ENCOUNTER — Other Ambulatory Visit: Payer: Self-pay | Admitting: Family Medicine

## 2014-10-11 ENCOUNTER — Ambulatory Visit: Payer: PPO | Admitting: Radiation Oncology

## 2014-10-15 ENCOUNTER — Ambulatory Visit
Admission: RE | Admit: 2014-10-15 | Discharge: 2014-10-15 | Disposition: A | Discharge status: Home / Self Care | Payer: PPO | Source: Ambulatory Visit | Attending: Radiation Oncology | Admitting: Radiation Oncology

## 2014-10-15 ENCOUNTER — Encounter: Payer: Self-pay | Admitting: Radiation Oncology

## 2014-10-15 VITALS — BP 145/104 | HR 68 | Temp 97.8°F | Resp 20

## 2014-10-15 DIAGNOSIS — C50512 Malignant neoplasm of lower-outer quadrant of left female breast: Secondary | ICD-10-CM

## 2014-10-15 HISTORY — DX: Personal history of irradiation: Z92.3

## 2014-10-15 NOTE — Progress Notes (Signed)
Radiation Oncology         [phone] ________________________________  Name: Stacy Diaz MRN: [id number]  Date: 10/15/2014  DOB: 05-Jan-1951  Follow-Up Visit Note  CC: Joycelyn Man, MD  Jovita Kussmaul, MD  Diagnosis:   left-sided breast cancer  Interval Since Last Radiation:  Approximately one month   Narrative:  The patient returns today for routine follow-up.  She has done well overall since she finished treatment. The patient's skin has healed significantly since she completed her course of radiation treatment. She has begun anti-hormonal treatment.                              ALLERGIES:  has No Known Allergies.  Meds: Current Outpatient Prescriptions  Medication Sig Dispense Refill  . anastrozole (ARIMIDEX) 1 MG tablet Take 1 tablet (1 mg total) by mouth daily. 90 tablet 3  . atenolol-chlorthalidone (TENORETIC) 50-25 MG per tablet Take 1 tablet by mouth daily. 100 tablet 3  . atorvastatin (LIPITOR) 10 MG tablet Take 1 tablet (10 mg total) by mouth daily. 100 tablet 3  . baclofen (LIORESAL) 20 MG tablet TAKE 1 TABLET (20 MG TOTAL) BY MOUTH 4 (FOUR) TIMES DAILY. (Patient taking differently: TAKE 1 TABLET (20 MG TOTAL) BY MOUTH 3 (three) TIMES DAILY.) 360 tablet 2  . Calcium Carbonate-Vitamin D (CALCIUM + D PO) Take 1 tablet by mouth daily. Calcium 400mg /300mg  vitamin d    . clopidogrel (PLAVIX) 75 MG tablet TAKE 1 TABLET BY MOUTH DAILY WITH BREAKFAST 90 tablet 2  . diazepam (VALIUM) 5 MG tablet Take 1 tablet (5 mg total) by mouth at bedtime. 90 tablet 3  . docusate sodium (COLACE) 100 MG capsule Take 100 mg by mouth 2 (two) times daily.    . hydrocortisone cream 0.5 % Apply 1 application topically daily as needed for itching.    Marland Kitchen KLOR-CON M10 10 MEQ tablet TAKE 1 TABLET (10 MEQ TOTAL) BY MOUTH 2 (TWO) TIMES DAILY. 180 tablet 3  . lacosamide (VIMPAT) 50 MG TABS tablet Take 1 tablet (50 mg total) by mouth 2 (two) times daily. 180 tablet 3  . LORazepam (ATIVAN) 1 MG  tablet Take 1 tablet (1 mg total) by mouth 2 (two) times daily as needed. for anxiety 180 tablet 2  . Melatonin 3 MG TABS Take 1 tablet (3 mg total) by mouth at bedtime. 90 tablet 3  . methocarbamol (ROBAXIN) 500 MG tablet Take 1 tablet (500 mg total) by mouth 3 (three) times daily. 300 tablet 3  . Multiple Vitamin (MULTIVITAMIN WITH MINERALS) TABS tablet Take 1 tablet by mouth daily.    Marland Kitchen OVER THE COUNTER MEDICATION Apply 1 application topically daily as needed (pain). Aspercreme with 5% lidocaine    . oxyCODONE-acetaminophen (PERCOCET) 10-325 MG per tablet Take 1 tablet by mouth 3 (three) times daily. scheduled 270 tablet 0  . polyethylene glycol (MIRALAX / GLYCOLAX) packet Take 17 g by mouth daily as needed (for constipation). Mix with liquid and drink    . zinc oxide (BALMEX) 11.3 % CREA cream Apply 1 application topically at bedtime as needed.     . doxycycline (VIBRA-TABS) 100 MG tablet Take 1 tablet (100 mg total) by mouth 2 (two) times daily. (Patient not taking: Reported on 10/15/2014) 20 tablet 2  . hyaluronate sodium (RADIAPLEXRX) GEL Apply 1 application topically 2 (two) times daily. Add to affected rad tx  To breast after rad tx and bedtime, and  on weekends    . lidocaine (LIDODERM) 5 % Place 1 patch onto the skin daily as needed (back pain). Remove & Discard patch within 12 hours or as directed by MD    . sulfamethoxazole-trimethoprim (BACTRIM) 400-80 MG per tablet Take 1 tablet by mouth 2 (two) times daily. (Patient not taking: Reported on 10/15/2014) 20 tablet 2   No current facility-administered medications for this encounter.    Physical Findings: The patient is in no acute distress. Patient is alert and oriented.  oral temperature is 97.8 F (36.6 C). Her blood pressure is 145/104 and her pulse is 68. Her respiration is 20. .   The skin in the treatment area has healed satisfactorily  Lab Findings: Lab Results  Component Value Date   WBC 7.4 06/14/2014   HGB 15.2*  06/14/2014   HCT 46.5* 06/14/2014   MCV 91.4 06/14/2014   PLT 209 06/14/2014     Radiographic Findings: No results found.  Impression:    The patient has done satisfactorily since finishing treatment. She has begun anti-hormonal treatment.  Plan:  The patient will followup in our clinic on a when necessary basis.   Jodelle Gross, M.D., Ph.D.

## 2014-10-15 NOTE — Progress Notes (Signed)
Followup s/p left breast rad txs 07/30/14-08/30/13, breast well healed, started Armidex 1mg  po daily, has hot flashes mostly at night, and increased leg stiffness stated since starting, using sorna OTC cream for dermatitis on arms and legs, appetite good, still fatigued some, no c/o pain  11:56 AM

## 2014-10-31 ENCOUNTER — Telehealth: Payer: Self-pay | Admitting: *Deleted

## 2014-10-31 ENCOUNTER — Ambulatory Visit (INDEPENDENT_AMBULATORY_CARE_PROVIDER_SITE_OTHER): Payer: PPO | Admitting: Neurology

## 2014-10-31 ENCOUNTER — Encounter: Payer: Self-pay | Admitting: Neurology

## 2014-10-31 VITALS — BP 151/87 | HR 66

## 2014-10-31 DIAGNOSIS — G459 Transient cerebral ischemic attack, unspecified: Secondary | ICD-10-CM | POA: Diagnosis not present

## 2014-10-31 MED ORDER — LACOSAMIDE 100 MG PO TABS: 100.0000 mg | ORAL_TABLET | Freq: Two times a day (BID) | ORAL | Status: DC

## 2014-10-31 NOTE — Telephone Encounter (Signed)
Stacy Diaz, Stacy Diaz was in today to see Dr Leonie Man, he increased Lacosamide to 100 mg BID, patient states that there Rx has be sent to manufacture by you. I have the signed Rx by Dr Leonie Man at my desk, patient did not take with them. Please advise

## 2014-10-31 NOTE — Patient Instructions (Signed)
I had a long discussion with the patient and her husband regarding her seizures and recommend increasing the dose of Vimpat to 100 mg twice daily. I have discussed possible side effects and when symptoms [facility] me if needed. Continue Plavix for stroke prevention and strict control of hypertension with blood pressure goal below 130/90. Check screening follow-up carotid ultrasound study. Return for follow-up in 6 months or call earlier if necessary

## 2014-10-31 NOTE — Telephone Encounter (Signed)
Hi [name], you can just fax the Rx to me and I'll contact the manufacturer regarding this change.  Thank you!

## 2014-10-31 NOTE — Telephone Encounter (Signed)
Patient also states that they have a years supply of this medication.

## 2014-10-31 NOTE — Progress Notes (Signed)
PATIENT: Stacy Diaz DOB: 09-24-1950  REASON FOR VISIT: routine follow up for stroke, partial seizures HISTORY FROM: patient  HISTORY OF PRESENT ILLNESS: [name] is a 64 year old female with history of previous meningioma resection with subsequent spastic paraparesis who developed left arm weakness and facial droop on the morning of 08/25/2012. She had multiple episodes in the past similar to the episode above but this one had been persistent. MRI was not an option since she had history of no clips to 2 meningioma resection. Since her surgery she has had trouble with weakness from waist down. At her baseline she had trouble angulating for the past 4 years but could bear weight on her legs but needed help with transfers. Lately her legs have been nonweight bearing at all.  She was admitted to [facility] for workup and CT was negative for acute infarct. She was discharged to [facility] after 3 days. On the third day there her symptoms became worse and she was readmitted to the hospital. On the second day of her second hospitalization she had a seizure that was witnessed and included loss of consciousness with a blank stare and lasted approximately 45 minutes. She suffered another seizure that was shorter in length approximately 10 minutes on the following day. She has not had any known recurrent seizures since that time. She was discharged on Keppra 750 mg twice a day and continues with that dose without any side effects.  She was discharged on Plavix 75 mg daily and continues with that dose. Patient denies medication side effects, with no signs of bleeding. Patient is in outpatient therapy.   Update 07/11/13 (PS): She is seen for her first office followup visit following hospital admission on 05/31/39 and with sudden onset of left facial droop and left upper weakness and numbness. She started improving by the time EMS came and she was back to nearly baseline within 1 hour. CT scan of  the head showed no acute abnormality. MRI could not be done because she has a metal surgical plate following meningioma resection surgery 10 years ago. She had EEG which was unremarkable. She was previously on Keppra 750 twice daily the dose of which was increased to 1 g twice daily. She states she has finished outpatient physical and occupational therapy and is almost 90% back to her baseline. She has spastic paraplegia following meningioma surgery 10 years ago and her similar episode left face and arm weakness possibly right brain stroke in January 2014. She is tolerating increase to Keppra but does complain of mild tiredness which seemed to be improving but is not back to her baseline. She is also tolerating Plavix without bleeding, bruising or other side effects. She states her blood pressure is well controlled, slightly elevated at 139/76 today. She had EEG done on 01/05/13 which showed no evidence of seizure activity.   UPDATE 10/18/13 (LL): She returns to the office for follow up, she reports she is doing well, back to her baseline. Gets very tired by the end of the day and due to core muscle weakness she is often leaning to the right in her wheelchair by the late afternoon. She has a new aide at home who is starting to help her do crunches and resistance band exercises. She is tolerating Keppra well with no recurrent partial seizures. Continues on Plavix, patient denies medication side effects, with no signs of bleeding.   UPDATE 04/27/14 (LL): She returns for stroke followup, has been well except  recently had a spell where she felt strange, called for her husband and he found her slumped in her chair and "out of it." They do not think she lost consciousness, and episode lasted less than 10 minutes and she was back to normal. She states that when she is hungry, she sometimes starts to feel poorly, anxious, and shaky and wonders if her blood sugar is too low. The feeling disappears when she eats. They do not  have a blood glucose meter. She has been compliant with taking her Keppra and all other medications. Her blood pressure is well controlled, it is 134/80 in the office today. She is tolerating Plavix well with no signs of significant bleeding or bruising. Update 10/31/2014 : She returns for follow-up after last visit 6 months ago accompanied by husband. She had 2 episodes of transient left upper extremity numbness lasting 10 minutes 3 weeks apart in August 2015. She also had an episode of seizure in September 2015 when she must have violent 3 hit the joystick off her electric wheelchair. At that time Keppra was changed to Vimpat which she takes in a dose of 50 mg twice daily. She does complain of tired numbness but this may be related to her recent diagnosis of breast cancer for which she has undergone lumpectomy and has received 4 weeks of radiation. She is currently on antiestrogen pill. She states her blood pressure has been good. REVIEW OF SYSTEMS: Full 14 system review of systems performed and notable only for rash , TIA, seizures only and all other systems negative. Recent diagnosis of breast cancer status post lumpectomy and radiation.  ALLERGIES: No Known Allergies  HOME MEDICATIONS: Outpatient Prescriptions Prior to Visit  Medication Sig Dispense Refill  . anastrozole (ARIMIDEX) 1 MG tablet Take 1 tablet (1 mg total) by mouth daily. 90 tablet 3  . atenolol-chlorthalidone (TENORETIC) 50-25 MG per tablet Take 1 tablet by mouth daily. 100 tablet 3  . atorvastatin (LIPITOR) 10 MG tablet Take 1 tablet (10 mg total) by mouth daily. 100 tablet 3  . baclofen (LIORESAL) 20 MG tablet TAKE 1 TABLET (20 MG TOTAL) BY MOUTH 4 (FOUR) TIMES DAILY. (Patient taking differently: TAKE 1 TABLET (20 MG TOTAL) BY MOUTH 3 (three) TIMES DAILY.) 360 tablet 2  . clopidogrel (PLAVIX) 75 MG tablet TAKE 1 TABLET BY MOUTH DAILY WITH BREAKFAST 90 tablet 2  . diazepam (VALIUM) 5 MG tablet Take 1 tablet (5 mg total) by mouth at  bedtime. 90 tablet 3  . docusate sodium (COLACE) 100 MG capsule Take 100 mg by mouth 2 (two) times daily.    . hyaluronate sodium (RADIAPLEXRX) GEL Apply 1 application topically 2 (two) times daily. Add to affected rad tx  To breast after rad tx and bedtime, and on weekends    . hydrocortisone cream 0.5 % Apply 1 application topically daily as needed for itching.    Marland Kitchen KLOR-CON M10 10 MEQ tablet TAKE 1 TABLET (10 MEQ TOTAL) BY MOUTH 2 (TWO) TIMES DAILY. 180 tablet 3  . lidocaine (LIDODERM) 5 % Place 1 patch onto the skin daily as needed (back pain). Remove & Discard patch within 12 hours or as directed by MD    . LORazepam (ATIVAN) 1 MG tablet Take 1 tablet (1 mg total) by mouth 2 (two) times daily as needed. for anxiety 180 tablet 2  . Melatonin 3 MG TABS Take 1 tablet (3 mg total) by mouth at bedtime. 90 tablet 3  . methocarbamol (ROBAXIN) 500 MG tablet  Take 1 tablet (500 mg total) by mouth 3 (three) times daily. 300 tablet 3  . Multiple Vitamin (MULTIVITAMIN WITH MINERALS) TABS tablet Take 1 tablet by mouth daily.    Marland Kitchen OVER THE COUNTER MEDICATION Apply 1 application topically daily as needed (pain). Aspercreme with 5% lidocaine    . oxyCODONE-acetaminophen (PERCOCET) 10-325 MG per tablet Take 1 tablet by mouth 3 (three) times daily. scheduled 270 tablet 0  . polyethylene glycol (MIRALAX / GLYCOLAX) packet Take 17 g by mouth daily as needed (for constipation). Mix with liquid and drink    . zinc oxide (BALMEX) 11.3 % CREA cream Apply 1 application topically at bedtime as needed.     . Calcium Carbonate-Vitamin D (CALCIUM + D PO) Take 1 tablet by mouth daily. Calcium 400mg /300mg  vitamin d    . doxycycline (VIBRA-TABS) 100 MG tablet Take 1 tablet (100 mg total) by mouth 2 (two) times daily. (Patient not taking: Reported on 10/15/2014) 20 tablet 2  . lacosamide (VIMPAT) 50 MG TABS tablet Take 1 tablet (50 mg total) by mouth 2 (two) times daily. 180 tablet 3  . sulfamethoxazole-trimethoprim (BACTRIM)  400-80 MG per tablet Take 1 tablet by mouth 2 (two) times daily. (Patient not taking: Reported on 10/15/2014) 20 tablet 2   No facility-administered medications prior to visit.    PHYSICAL EXAM Filed Vitals:   10/31/14 1100  BP: 151/87  Pulse: 66    GENERAL EXAM: Pleasant middle-age Caucasian lady Patient is in no distress, pleasant, wheelchair bound  HEAD: Symmetric facial features.  EARS, NOSE, and THROAT: Normal.  NECK: Supple, no JVD  RESPIRATORY: Lungs CTA.  CARDIOVASCULAR: Regular rate and rhythm, no murmurs, no carotid bruits  SKIN: No rash, no bruising   NEUROLOGIC:  MENTAL STATUS: awake, alert and oriented to person, place and time, language fluent, comprehension intact, naming intact  CRANIAL NERVE: pupils equal and reactive to light, visual fields full to confrontation, mild facial asymmetry on the left, uvula midline, shoulder shrug symmetric, tongue midline.  MOTOR: Significant weakness in right and left lower extremities with only 1/5 strength hip extension and 0/5 at the hips ankles and toes. Tone is increase in the both lower extremities, 4/5 strength left upper extremity, fine finger movements decreased on left  SENSORY: normal and symmetric to light touch . No sensory level on the trunk COORDINATION: finger-nose-finger with mild ataxia on left, normal on right  REFLEXES: deep tendon reflexes BUE present and symmetric 2+, BLE not tested.  GAIT/STATION: Wheelchair-bound.   ASSESSMENT: Ms. Meryle Pugmire is a 64 year old female with history of previous meningioma resection with subsequent spastic paraparesis who developed left arm weakness and facial droop on the morning of 08/25/2012. Felt to be stroke although never confirmed on CT. She experienced 2 partial complex seizures while in the hospital and was discharged on Keppra 750 mg twice a day. She has had a recurrent seizure in September 2015 and was changed from [place] to [place] and seems to be doing well on it PLAN:    I had a long discussion with the patient and her husband regarding her seizures and recommend increasing the dose of Vimpat to 100 mg twice daily. I have discussed possible side effects and when symptoms [facility] me if needed. Continue Plavix for stroke prevention and strict control of hypertension with blood pressure goal below 130/90. Check screening follow-up carotid ultrasound study. Return for follow-up in 6 months or call earlier if necessary  Antony Contras, MD  10/31/2014, 1:59 PM [facility]  [address], [place], [place] [address] [phone]  Note: This document was prepared with digital dictation and possible smart phrase technology. Any transcriptional errors that result from this process are unintentional.

## 2014-11-02 NOTE — Telephone Encounter (Signed)
Will do! thanks

## 2014-11-07 ENCOUNTER — Telehealth: Payer: Self-pay

## 2014-11-07 NOTE — Telephone Encounter (Signed)
Called patient to inform medication available for pick up at front desk. Spouse verbalized understanding.

## 2014-11-14 ENCOUNTER — Ambulatory Visit (INDEPENDENT_AMBULATORY_CARE_PROVIDER_SITE_OTHER): Payer: PPO

## 2014-11-14 DIAGNOSIS — G459 Transient cerebral ischemic attack, unspecified: Secondary | ICD-10-CM | POA: Diagnosis not present

## 2014-12-16 ENCOUNTER — Other Ambulatory Visit: Payer: Self-pay | Admitting: Family Medicine

## 2014-12-23 NOTE — Assessment & Plan Note (Signed)
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 Oncotype DX recurrence score 16; 10% risk of recurrence, low risk did not need chemotherapy status post radiation therapy completed January 2016, started antiestrogen therapy with Arimidex 1 mg daily 09/07/2014  Arimidex toxicities:no major side effects from treatment. Patient has chronic muscle stiffness and aches which are unchanged. Denies any hot flashes.  Cellulitis underneath the leg: Took 2 antibiotics  Return to clinic in 6 months for follow-up.

## 2014-12-24 ENCOUNTER — Telehealth: Payer: Self-pay | Admitting: Hematology and Oncology

## 2014-12-24 ENCOUNTER — Ambulatory Visit (HOSPITAL_BASED_OUTPATIENT_CLINIC_OR_DEPARTMENT_OTHER): Payer: PPO | Admitting: Hematology and Oncology

## 2014-12-24 VITALS — BP 148/87 | HR 69 | Temp 98.7°F | Resp 18

## 2014-12-24 DIAGNOSIS — C50512 Malignant neoplasm of lower-outer quadrant of left female breast: Secondary | ICD-10-CM | POA: Diagnosis not present

## 2014-12-24 DIAGNOSIS — Z170 Estrogen receptor positive status [ER+]: Secondary | ICD-10-CM | POA: Diagnosis not present

## 2014-12-24 NOTE — Telephone Encounter (Signed)
Gave avs & calendar for November.  °

## 2014-12-24 NOTE — Progress Notes (Signed)
Patient Care Team: Dorena Cookey, MD as PCP - [place] III, MD as Consulting Physician (General Surgery) Nicholas Lose, MD as Consulting Physician (Hematology and Oncology) Kyung Rudd, MD as Consulting Physician (Radiation Oncology)  DIAGNOSIS: Breast cancer of lower-outer quadrant of left female breast   Staging form: Breast, AJCC 7th Edition     Clinical: Stage IA (T1c, N0, cM0) - Unsigned       Staging comments: Staged at breast conference 05/30/14.      Pathologic: Stage IIA (T2, N0, cM0) - Unsigned   SUMMARY OF ONCOLOGIC HISTORY:   Breast cancer of lower-outer quadrant of left female breast   05/21/2014 Mammogram Mammogram and Ultrasound: Left breast 2 cm complex cystic asymmetric abnormality   05/25/2014 Initial Diagnosis Invasive mammary cancer with lobular features with perineural invasion, grade 2, ER positive PR positive HER-2 negative, Ki-67 25%   06/22/2014 Surgery Breast lumpectomy: Invasive lobular carcinoma, grade 3, 2.7 cm, with LCIS, ER positive, PR positive, HER-2 negative, Ki-67 65%, T2, N0, M0 stage II A3 SLN negative; oncotype 16 low risk 10% ROR   07/30/2014 - 08/28/2014 Radiation Therapy Adjuvant radiation   09/07/2014 -  Anti-estrogen oral therapy Anastrazole 1 mg daily    CHIEF COMPLIANT:  Follow-up of breast cancer and answers all  INTERVAL HISTORY: Stacy Diaz is a  64 year old lady with above-mentioned history of left breast cancer treated with lumpectomy radiation was current in the last those in January 2016. She is tolerating it very well without any major problems or concerns. She does have very mild hot flashes but denies any muscle aches or pains. Denies any lumps or nodules in the breast.  REVIEW OF SYSTEMS:   Constitutional: Denies fevers, chills or abnormal weight loss Eyes: Denies blurriness of vision Ears, nose, mouth, throat, and face: Denies mucositis or sore throat Respiratory: Denies cough, dyspnea or wheezes Cardiovascular: Denies  palpitation, chest discomfort or lower extremity swelling Gastrointestinal:  Denies nausea, heartburn or change in bowel habits Skin: Denies abnormal skin rashes Lymphatics: Denies new lymphadenopathy or easy bruising Neurological:Denies numbness, tingling or new weaknesses Behavioral/Psych: Mood is stable, no new changes  Breast:  denies any pain or lumps or nodules in either breasts All other systems were reviewed with the patient and are negative.  I have reviewed the past medical history, past surgical history, social history and family history with the patient and they are unchanged from previous note.  ALLERGIES:  has No Known Allergies.  MEDICATIONS:  Current Outpatient Prescriptions  Medication Sig Dispense Refill  . anastrozole (ARIMIDEX) 1 MG tablet Take 1 tablet (1 mg total) by mouth daily. 90 tablet 3  . atenolol-chlorthalidone (TENORETIC) 50-25 MG per tablet Take 1 tablet by mouth daily. 100 tablet 3  . atorvastatin (LIPITOR) 10 MG tablet Take 1 tablet (10 mg total) by mouth daily. 100 tablet 3  . baclofen (LIORESAL) 20 MG tablet TAKE 1 TABLET (20 MG TOTAL) BY MOUTH 4 (FOUR) TIMES DAILY. (Patient taking differently: TAKE 1 TABLET (20 MG TOTAL) BY MOUTH 3 (three) TIMES DAILY.) 360 tablet 2  . baclofen (LIORESAL) 20 MG tablet TAKE 1 TABLET (20 MG TOTAL) BY MOUTH 4 (FOUR) TIMES DAILY. 360 tablet 3  . clopidogrel (PLAVIX) 75 MG tablet TAKE 1 TABLET BY MOUTH DAILY WITH BREAKFAST 90 tablet 2  . diazepam (VALIUM) 5 MG tablet Take 1 tablet (5 mg total) by mouth at bedtime. 90 tablet 3  . docusate sodium (COLACE) 100 MG capsule Take 100 mg by mouth  2 (two) times daily.    . hyaluronate sodium (RADIAPLEXRX) GEL Apply 1 application topically 2 (two) times daily. Add to affected rad tx  To breast after rad tx and bedtime, and on weekends    . hydrocortisone cream 0.5 % Apply 1 application topically daily as needed for itching.    Marland Kitchen KLOR-CON M10 10 MEQ tablet TAKE 1 TABLET (10 MEQ TOTAL) BY  MOUTH 2 (TWO) TIMES DAILY. 180 tablet 3  . lacosamide 100 MG TABS Take 1 tablet (100 mg total) by mouth 2 (two) times daily. 180 tablet 3  . lidocaine (LIDODERM) 5 % Place 1 patch onto the skin daily as needed (back pain). Remove & Discard patch within 12 hours or as directed by MD    . LORazepam (ATIVAN) 1 MG tablet Take 1 tablet (1 mg total) by mouth 2 (two) times daily as needed. for anxiety 180 tablet 2  . Melatonin 3 MG TABS Take 1 tablet (3 mg total) by mouth at bedtime. 90 tablet 3  . methocarbamol (ROBAXIN) 500 MG tablet Take 1 tablet (500 mg total) by mouth 3 (three) times daily. 300 tablet 3  . Multiple Vitamin (MULTIVITAMIN WITH MINERALS) TABS tablet Take 1 tablet by mouth daily.    Marland Kitchen OVER THE COUNTER MEDICATION Apply 1 application topically daily as needed (pain). Aspercreme with 5% lidocaine    . oxyCODONE-acetaminophen (PERCOCET) 10-325 MG per tablet Take 1 tablet by mouth 3 (three) times daily. scheduled 270 tablet 0  . polyethylene glycol (MIRALAX / GLYCOLAX) packet Take 17 g by mouth daily as needed (for constipation). Mix with liquid and drink    . zinc oxide (BALMEX) 11.3 % CREA cream Apply 1 application topically at bedtime as needed.      No current facility-administered medications for this visit.    PHYSICAL EXAMINATION: ECOG PERFORMANCE STATUS: 0 - Asymptomatic  There were no vitals filed for this visit. There were no vitals filed for this visit.  GENERAL:alert, no distress and comfortable SKIN: skin color, texture, turgor are normal, no rashes or significant lesions EYES: normal, Conjunctiva are pink and non-injected, sclera clear OROPHARYNX:no exudate, no erythema and lips, buccal mucosa, and tongue normal  NECK: supple, thyroid normal size, non-tender, without nodularity LYMPH:  no palpable lymphadenopathy in the cervical, axillary or inguinal LUNGS: clear to auscultation and percussion with normal breathing effort HEART: regular rate & rhythm and no murmurs and  no lower extremity edema ABDOMEN:abdomen soft, non-tender and normal bowel sounds Musculoskeletal:no cyanosis of digits and no clubbing  NEURO: alert & oriented x 3 with fluent speech, no focal motor/sensory deficits BREAST: No palpable masses or nodules in either right or left breasts. No palpable axillary supraclavicular or infraclavicular adenopathy no breast tenderness or nipple discharge. (exam performed in the presence of a chaperone)  LABORATORY DATA:  I have reviewed the data as listed   Chemistry      Component Value Date/Time   NA 142 06/14/2014 1253   NA 143 05/30/2014 1212   K 3.7 06/14/2014 1253   K 3.5 05/30/2014 1212   CL 100 06/14/2014 1253   CO2 25 06/14/2014 1253   CO2 28 05/30/2014 1212   BUN 15 06/14/2014 1253   BUN 14.6 05/30/2014 1212   CREATININE 0.57 06/14/2014 1253   CREATININE 0.7 05/30/2014 1212      Component Value Date/Time   CALCIUM 10.0 06/14/2014 1253   CALCIUM 9.9 05/30/2014 1212   ALKPHOS 75 05/30/2014 1212   ALKPHOS 63 07/17/2013 1142  AST 26 05/30/2014 1212   AST 24 07/17/2013 1142   ALT 34 05/30/2014 1212   ALT 29 07/17/2013 1142   BILITOT 0.49 05/30/2014 1212   BILITOT 0.6 07/17/2013 1142       Lab Results  Component Value Date   WBC 7.4 06/14/2014   HGB 15.2* 06/14/2014   HCT 46.5* 06/14/2014   MCV 91.4 06/14/2014   PLT 209 06/14/2014   NEUTROABS 3.6 05/30/2014    ASSESSMENT & PLAN:  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 Oncotype DX recurrence score 16; 10% risk of recurrence, low risk did not need chemotherapy status post radiation therapy completed January 2016, started antiestrogen therapy with Arimidex 1 mg daily 09/07/2014  Arimidex toxicities:no major side effects from treatment. Patient has chronic muscle stiffness and aches which are unchanged.  Very mild hot flashes  especially after dinner  Cellulitis underneath the leg: Took 2 antibiotics and they finally subsided  Breast Cancer Surveillance: 1. Breast exam  12/24/2014:  Left breast nipple retraction and tenderness along the surgical scar with nodularity along the scar but beyond that there are no lumps or nodules in the breasts. 2. Mammogram  06/28/2014 at [facility] No abnormalities. Postsurgical changes. I recommended that she get 3-D mammograms for surveillance. Discussed the differences between different breast density categories.   Return to clinic in 6 months for follow-up.  No orders of the defined types were placed in this encounter.   The patient has a good understanding of the overall plan. she agrees with it. she will call with any problems that may develop before the next visit here.   Rulon Eisenmenger, MD

## 2015-01-02 ENCOUNTER — Other Ambulatory Visit: Payer: Self-pay | Admitting: *Deleted

## 2015-01-02 MED ORDER — OXYCODONE-ACETAMINOPHEN 10-325 MG PO TABS: 1.0000 | ORAL_TABLET | Freq: Three times a day (TID) | ORAL | Status: DC

## 2015-01-02 NOTE — Telephone Encounter (Signed)
Rx for percocet.  Okay per Dr Sherren Mocha and patient is aware.

## 2015-01-27 ENCOUNTER — Other Ambulatory Visit: Payer: Self-pay | Admitting: Family Medicine

## 2015-02-22 ENCOUNTER — Other Ambulatory Visit: Payer: Self-pay | Admitting: Family Medicine

## 2015-03-15 ENCOUNTER — Other Ambulatory Visit: Payer: Self-pay | Admitting: Family Medicine

## 2015-05-12 ENCOUNTER — Other Ambulatory Visit: Payer: Self-pay | Admitting: Family Medicine

## 2015-05-14 ENCOUNTER — Encounter: Payer: Self-pay | Admitting: Neurology

## 2015-05-14 ENCOUNTER — Ambulatory Visit (INDEPENDENT_AMBULATORY_CARE_PROVIDER_SITE_OTHER): Payer: PPO | Admitting: Neurology

## 2015-05-14 ENCOUNTER — Ambulatory Visit (INDEPENDENT_AMBULATORY_CARE_PROVIDER_SITE_OTHER): Payer: PPO | Admitting: *Deleted

## 2015-05-14 VITALS — BP 137/87 | HR 76 | Ht 62.0 in

## 2015-05-14 DIAGNOSIS — R569 Unspecified convulsions: Secondary | ICD-10-CM

## 2015-05-14 DIAGNOSIS — Z23 Encounter for immunization: Secondary | ICD-10-CM | POA: Diagnosis not present

## 2015-05-14 NOTE — Patient Instructions (Signed)
I had a long d/w patient about her remote stroke and seizures, risk for recurrent stroke/TIAs, seizures, personally independently reviewed imaging studies and stroke evaluation results and answered questions.Continue Plavix for secondary stroke prevention and maintain strict control of hypertension with blood pressure goal below 130/90, diabetes with hemoglobin A1c goal below 6.5% and lipids with LDL cholesterol goal below 100 mg/dL. Continue baclofen 20 mg 4 times daily for spasticity  and Vimpat 100 mg twice daily for seizures both of which appear stable Followup in the future with me in future in a year if stable and earlier if necessary.

## 2015-05-14 NOTE — Progress Notes (Signed)
PATIENT: Stacy Diaz DOB: 09-24-1950  REASON FOR VISIT: routine follow up for stroke, partial seizures HISTORY FROM: patient  HISTORY OF PRESENT ILLNESS: Stacy Diaz is a 64 year old female with history of previous meningioma resection with subsequent spastic paraparesis who developed left arm weakness and facial droop on the morning of 08/25/2012. She had multiple episodes in the past similar to the episode above but this one had been persistent. MRI was not an option since she had history of no clips to 2 meningioma resection. Since her surgery she has had trouble with weakness from waist down. At her baseline she had trouble angulating for the past 4 years but could bear weight on her legs but needed help with transfers. Lately her legs have been nonweight bearing at all.  She was admitted to [facility] for workup and CT was negative for acute infarct. She was discharged to [facility] after 3 days. On the third day there her symptoms became worse and she was readmitted to the hospital. On the second day of her second hospitalization she had a seizure that was witnessed and included loss of consciousness with a blank stare and lasted approximately 45 minutes. She suffered another seizure that was shorter in length approximately 10 minutes on the following day. She has not had any known recurrent seizures since that time. She was discharged on Keppra 750 mg twice a day and continues with that dose without any side effects.  She was discharged on Plavix 75 mg daily and continues with that dose. Patient denies medication side effects, with no signs of bleeding. Patient is in outpatient therapy.   Update 07/11/13 (PS): She is seen for her first office followup visit following hospital admission on 05/31/39 and with sudden onset of left facial droop and left upper weakness and numbness. She started improving by the time EMS came and she was back to nearly baseline within 1 hour. CT scan of  the head showed no acute abnormality. MRI could not be done because she has a metal surgical plate following meningioma resection surgery 10 years ago. She had EEG which was unremarkable. She was previously on Keppra 750 twice daily the dose of which was increased to 1 g twice daily. She states she has finished outpatient physical and occupational therapy and is almost 90% back to her baseline. She has spastic paraplegia following meningioma surgery 10 years ago and her similar episode left face and arm weakness possibly right brain stroke in January 2014. She is tolerating increase to Keppra but does complain of mild tiredness which seemed to be improving but is not back to her baseline. She is also tolerating Plavix without bleeding, bruising or other side effects. She states her blood pressure is well controlled, slightly elevated at 139/76 today. She had EEG done on 01/05/13 which showed no evidence of seizure activity.   UPDATE 10/18/13 (LL): She returns to the office for follow up, she reports she is doing well, back to her baseline. Gets very tired by the end of the day and due to core muscle weakness she is often leaning to the right in her wheelchair by the late afternoon. She has a new aide at home who is starting to help her do crunches and resistance band exercises. She is tolerating Keppra well with no recurrent partial seizures. Continues on Plavix, patient denies medication side effects, with no signs of bleeding.   UPDATE 04/27/14 (LL): She returns for stroke followup, has been well except  recently had a spell where she felt strange, called for her husband and he found her slumped in her chair and "out of it." They do not think she lost consciousness, and episode lasted less than 10 minutes and she was back to normal. She states that when she is hungry, she sometimes starts to feel poorly, anxious, and shaky and wonders if her blood sugar is too low. The feeling disappears when she eats. They do not  have a blood glucose meter. She has been compliant with taking her Keppra and all other medications. Her blood pressure is well controlled, it is 134/80 in the office today. She is tolerating Plavix well with no signs of significant bleeding or bruising. Update 10/31/2014 : She returns for follow-up after last visit 6 months ago accompanied by husband. She had 2 episodes of transient left upper extremity numbness lasting 10 minutes 3 weeks apart in August 2015. She also had an episode of seizure in September 2015 when she must have violent 3 hit the joystick off her electric wheelchair. At that time Keppra was changed to Vimpat which she takes in a dose of 50 mg twice daily. She does complain of tired numbness but this may be related to her recent diagnosis of breast cancer for which she has undergone lumpectomy and has received 4 weeks of radiation. She is currently on antiestrogen pill. She states her blood pressure has been good. Update 05/14/2015 : She returns for follow-up after last visit 6 months ago. She is doing well without recurrent TIA symptoms or seizures. She is tolerating Vimpat 100 mg twice daily without any side effects. She remains on baclofen 20 mg 4 times daily which seems to work well for her spasticity and when it wears off she notices a difference. She has been started on Arimidex by her cancer doctor and feels that this may have increased stiffness. She plans to discuss this with her cancer doctor at the next visit. She remains on Plavix which is tolerating well without any bleeding episodes but does have minor bruising. She is also on Lipitor without muscle aches or pains. She is due for follow-up lipid profile in 2 months at next visit with primary doctor. She remains wheelchair bound since her meningioma surgery and can help with transfers and can  bear weight. but cannot walk REVIEW OF SYSTEMS: Full 14 system review of systems performed and notable only for , TIA, seizures only and all  other systems negative. Recent diagnosis of breast cancer status post lumpectomy and radiation.  ALLERGIES: No Known Allergies  HOME MEDICATIONS: Outpatient Prescriptions Prior to Visit  Medication Sig Dispense Refill  . anastrozole (ARIMIDEX) 1 MG tablet Take 1 tablet (1 mg total) by mouth daily. 90 tablet 3  . atenolol-chlorthalidone (TENORETIC) 50-25 MG per tablet TAKE 1 TABLET BY MOUTH DAILY. 90 tablet 0  . atorvastatin (LIPITOR) 10 MG tablet Take 1 tablet (10 mg total) by mouth daily. 100 tablet 3  . baclofen (LIORESAL) 20 MG tablet TAKE 1 TABLET (20 MG TOTAL) BY MOUTH 4 (FOUR) TIMES DAILY. 360 tablet 1  . clopidogrel (PLAVIX) 75 MG tablet TAKE 1 TABLET BY MOUTH DAILY WITH BREAKFAST 90 tablet 2  . diazepam (VALIUM) 5 MG tablet TAKE 1 TABLET (5MG  TOTAL) BY MOUTH AT BEDTIME 90 tablet 1  . docusate sodium (COLACE) 100 MG capsule Take 100 mg by mouth 2 (two) times daily.    Marland Kitchen KLOR-CON M10 10 MEQ tablet TAKE 1 TABLET (10 MEQ TOTAL) BY MOUTH  2 (TWO) TIMES DAILY. 180 tablet 3  . lacosamide 100 MG TABS Take 1 tablet (100 mg total) by mouth 2 (two) times daily. 180 tablet 3  . lidocaine (LIDODERM) 5 % Place 1 patch onto the skin daily as needed (back pain). Remove & Discard patch within 12 hours or as directed by MD    . LORazepam (ATIVAN) 1 MG tablet TAKE 1 TABLET BY MOUTH TWICE DAILY AS NEEDED FOR ANXIETY 180 tablet 1  . Melatonin 3 MG TABS Take 1 tablet (3 mg total) by mouth at bedtime. 90 tablet 3  . methocarbamol (ROBAXIN) 500 MG tablet Take 1 tablet (500 mg total) by mouth 3 (three) times daily. 300 tablet 3  . Multiple Vitamin (MULTIVITAMIN WITH MINERALS) TABS tablet Take 1 tablet by mouth daily.    Marland Kitchen OVER THE COUNTER MEDICATION Apply 1 application topically daily as needed (pain). Aspercreme with 5% lidocaine    . oxyCODONE-acetaminophen (PERCOCET) 10-325 MG per tablet Take 1 tablet by mouth 3 (three) times daily. scheduled 270 tablet 0  . polyethylene glycol (MIRALAX / GLYCOLAX) packet  Take 17 g by mouth daily as needed (for constipation). Mix with liquid and drink    . zinc oxide (BALMEX) 11.3 % CREA cream Apply 1 application topically at bedtime as needed.     . baclofen (LIORESAL) 20 MG tablet TAKE 1 TABLET (20 MG TOTAL) BY MOUTH 4 (FOUR) TIMES DAILY. (Patient not taking: Reported on 05/14/2015) 360 tablet 3  . methocarbamol (ROBAXIN) 500 MG tablet TAKE 1 TABLET (500 MG TOTAL) BY MOUTH 3 (THREE) TIMES DAILY. (Patient not taking: Reported on 05/14/2015) 270 tablet 3  . oxyCODONE-acetaminophen (PERCOCET) 10-325 MG per tablet Take 1 tablet by mouth 3 (three) times daily. (Patient not taking: Reported on 05/14/2015) 270 tablet 0   No facility-administered medications prior to visit.    PHYSICAL EXAM Filed Vitals:   05/14/15 1327  BP: 137/87  Pulse: 76  Height: 5\' 2"  (1.575 m)    GENERAL EXAM: Pleasant middle-age Caucasian lady Patient is in no distress, pleasant, wheelchair bound  HEAD: Symmetric facial features.  EARS, NOSE, and THROAT: Normal.  NECK: Supple, no JVD  RESPIRATORY: Lungs CTA.  CARDIOVASCULAR: Regular rate and rhythm, no murmurs, no carotid bruits  SKIN: No rash, no bruising   NEUROLOGIC:  MENTAL STATUS: awake, alert and oriented to person, place and time, language fluent, comprehension intact, naming intact  CRANIAL NERVE: pupils equal and reactive to light, visual fields full to confrontation, mild facial asymmetry on the left, uvula midline, shoulder shrug symmetric, tongue midline.  MOTOR: Significant weakness in right and left lower extremities with only 1/5 strength hip extension and 0/5 at the hips ankles and toes. Tone is increase in the both lower extremities, 4/5 strength left upper extremity, fine finger movements decreased on left  SENSORY: normal and symmetric to light touch . No sensory level on the trunk COORDINATION: finger-nose-finger with mild ataxia on left, normal on right  REFLEXES: deep tendon reflexes BUE present and symmetric 2+,  BLE brisk 2= but difficult to elicit  GAIT/STATION: Wheelchair-bound.   ASSESSMENT: Ms. Avalene Sealy is a 64 year old female with history of previous meningioma resection with subsequent spastic paraparesis who developed left arm weakness and facial droop on the morning of 08/25/2012. Felt to be stroke although never confirmed on CT. She experienced 2 partial complex seizures while in the hospital and was discharged on Keppra 750 mg twice a day. She has had a recurrent seizure in September 2015  and was changed from [place] to Vimpat and seems to be doing well on it PLAN:  I had a long d/w patient about her remote stroke and seizures, risk for recurrent stroke/TIAs, seizures, personally independently reviewed imaging studies and stroke evaluation results and answered questions.Continue Plavix for secondary stroke prevention and maintain strict control of hypertension with blood pressure goal below 130/90, diabetes with hemoglobin A1c goal below 6.5% and lipids with LDL cholesterol goal below 100 mg/dL. Continue baclofen 20 mg 4 times daily for spasticity  and Vimpat 100 mg twice daily for seizures both of which appear stable Followup in the future with me in future in a year if stable and earlier if necessary.   Antony Contras, MD  05/14/2015, 2:06 PM [facility] [address] [place], [place] [address] [phone]  Note: This document was prepared with digital dictation and possible smart phrase technology. Any transcriptional errors that result from this process are unintentional.

## 2015-05-16 ENCOUNTER — Telehealth: Payer: Self-pay | Admitting: Family Medicine

## 2015-05-16 NOTE — Telephone Encounter (Signed)
Okay to schedule.  Please allow 45 mins if possible.

## 2015-05-16 NOTE — Telephone Encounter (Signed)
Pt needs cpx in dec 2016. Pt oncologist would like blood work result in [place]. Pt has history of breast cancer. Can I create 30 min slot?

## 2015-05-17 NOTE — Telephone Encounter (Signed)
lmom for pt to call back

## 2015-05-19 ENCOUNTER — Other Ambulatory Visit: Payer: Self-pay | Admitting: Family Medicine

## 2015-05-20 NOTE — Telephone Encounter (Signed)
Pt has been sch

## 2015-05-23 ENCOUNTER — Other Ambulatory Visit: Payer: Self-pay | Admitting: Family Medicine

## 2015-05-27 ENCOUNTER — Other Ambulatory Visit: Payer: Self-pay | Admitting: Hematology and Oncology

## 2015-05-27 DIAGNOSIS — C50512 Malignant neoplasm of lower-outer quadrant of left female breast: Secondary | ICD-10-CM

## 2015-06-12 NOTE — Telephone Encounter (Signed)
Error

## 2015-06-25 ENCOUNTER — Telehealth: Payer: Self-pay | Admitting: Hematology and Oncology

## 2015-06-25 ENCOUNTER — Ambulatory Visit (HOSPITAL_BASED_OUTPATIENT_CLINIC_OR_DEPARTMENT_OTHER): Payer: PPO | Admitting: Hematology and Oncology

## 2015-06-25 ENCOUNTER — Encounter: Payer: Self-pay | Admitting: Hematology and Oncology

## 2015-06-25 VITALS — BP 138/76 | HR 87 | Temp 98.1°F | Resp 16

## 2015-06-25 DIAGNOSIS — Z170 Estrogen receptor positive status [ER+]: Secondary | ICD-10-CM

## 2015-06-25 DIAGNOSIS — C50512 Malignant neoplasm of lower-outer quadrant of left female breast: Secondary | ICD-10-CM

## 2015-06-25 MED ORDER — LETROZOLE 2.5 MG PO TABS: 2.5000 mg | ORAL_TABLET | Freq: Every day | ORAL | Status: DC

## 2015-06-25 NOTE — Telephone Encounter (Signed)
Gave patient avs report and appointment for February 2017 and mammo/bone density at the [facility] December 2016 - 1st available for both tests. Patient aware she will need to call [facility] to have film/records transferred to [facility].

## 2015-06-25 NOTE — Progress Notes (Signed)
Patient Care Team: Dorena Cookey, MD as PCP - [place] III, MD as Consulting Physician (General Surgery) Nicholas Lose, MD as Consulting Physician (Hematology and Oncology) Kyung Rudd, MD as Consulting Physician (Radiation Oncology)  DIAGNOSIS: Breast cancer of lower-outer quadrant of left female breast [facility])   Staging form: Breast, AJCC 7th Edition     Clinical: Stage IA (T1c, N0, cM0) - Unsigned       Staging comments: Staged at breast conference 05/30/14.      Pathologic: Stage IIA (T2, N0, cM0) - Unsigned   SUMMARY OF ONCOLOGIC HISTORY:   Breast cancer of lower-outer quadrant of left female breast ([place])   05/21/2014 Mammogram Mammogram and Ultrasound: Left breast 2 cm complex cystic asymmetric abnormality   05/25/2014 Initial Diagnosis Invasive mammary cancer with lobular features with perineural invasion, grade 2, ER positive PR positive HER-2 negative, Ki-67 25%   06/22/2014 Surgery Breast lumpectomy: Invasive lobular carcinoma, grade 3, 2.7 cm, with LCIS, ER positive, PR positive, HER-2 negative, Ki-67 65%, T2, N0, M0 stage II A3 SLN negative; oncotype 16 low risk 10% ROR   07/30/2014 - 08/28/2014 Radiation Therapy Adjuvant radiation   09/07/2014 -  Anti-estrogen oral therapy Anastrazole 1 mg daily switch to letrozole 06/25/2015 for myalgias    CHIEF COMPLIANT: follow-up on anastrozole  INTERVAL HISTORY: Stacy Diaz is a 64 year old with above-mentioned history of left breast cancer currently on anastrozole therapy. She has a lot of stiffness in her knees and ankles especially in the morning. She wanted to know if there are alternatives to anastrozole that do not cause muscle stiffness and achiness.  REVIEW OF SYSTEMS:   Constitutional: Denies fevers, chills or abnormal weight loss Eyes: Denies blurriness of vision Ears, nose, mouth, throat, and face: Denies mucositis or sore throat Respiratory: Denies cough, dyspnea or wheezes Cardiovascular: Denies palpitation,  chest discomfort or lower extremity swelling Gastrointestinal:  Denies nausea, heartburn or change in bowel habits Skin: Denies abnormal skin rashes Lymphatics: Denies new lymphadenopathy or easy bruising Neurological:Denies numbness, tingling or new weaknesses Behavioral/Psych: Mood is stable, no new changes   All other systems were reviewed with the patient and are negative.  I have reviewed the past medical history, past surgical history, social history and family history with the patient and they are unchanged from previous note.  ALLERGIES:  has No Known Allergies.  MEDICATIONS:  Current Outpatient Prescriptions  Medication Sig Dispense Refill  . atenolol-chlorthalidone (TENORETIC) 50-25 MG per tablet TAKE 1 TABLET BY MOUTH DAILY. 90 tablet 0  . atorvastatin (LIPITOR) 10 MG tablet TAKE 1 TABLET BY MOUTH DAILY 90 tablet 2  . baclofen (LIORESAL) 20 MG tablet TAKE 1 TABLET (20 MG TOTAL) BY MOUTH 4 (FOUR) TIMES DAILY. 360 tablet 1  . clopidogrel (PLAVIX) 75 MG tablet TAKE 1 TABLET BY MOUTH DAILY WITH BREAKFAST 90 tablet 2  . diazepam (VALIUM) 5 MG tablet TAKE 1 TABLET (5MG TOTAL) BY MOUTH AT BEDTIME 90 tablet 1  . docusate sodium (COLACE) 100 MG capsule Take 100 mg by mouth 2 (two) times daily.    Marland Kitchen KLOR-CON M10 10 MEQ tablet TAKE 1 TABLET (10 MEQ TOTAL) BY MOUTH 2 (TWO) TIMES DAILY. 180 tablet 3  . lacosamide 100 MG TABS Take 1 tablet (100 mg total) by mouth 2 (two) times daily. 180 tablet 3  . letrozole (FEMARA) 2.5 MG tablet Take 1 tablet (2.5 mg total) by mouth daily. 90 tablet 3  . lidocaine (LIDODERM) 5 % Place 1 patch onto the  skin daily as needed (back pain). Remove & Discard patch within 12 hours or as directed by MD    . LORazepam (ATIVAN) 1 MG tablet TAKE 1 TABLET BY MOUTH TWICE DAILY AS NEEDED FOR ANXIETY 180 tablet 1  . Melatonin 3 MG TABS Take 1 tablet (3 mg total) by mouth at bedtime. 90 tablet 3  . methocarbamol (ROBAXIN) 500 MG tablet Take 1 tablet (500 mg total) by  mouth 3 (three) times daily. 300 tablet 3  . Multiple Vitamin (MULTIVITAMIN WITH MINERALS) TABS tablet Take 1 tablet by mouth daily.    Marland Kitchen OVER THE COUNTER MEDICATION Apply 1 application topically daily as needed (pain). Aspercreme with 5% lidocaine    . oxyCODONE-acetaminophen (PERCOCET) 10-325 MG per tablet Take 1 tablet by mouth 3 (three) times daily. scheduled 270 tablet 0  . polyethylene glycol (MIRALAX / GLYCOLAX) packet Take 17 g by mouth daily as needed (for constipation). Mix with liquid and drink    . zinc oxide (BALMEX) 11.3 % CREA cream Apply 1 application topically at bedtime as needed.      No current facility-administered medications for this visit.    PHYSICAL EXAMINATION: ECOG PERFORMANCE STATUS: 1 - Symptomatic but completely ambulatory  Filed Vitals:   06/25/15 1354  BP: 138/76  Pulse: 87  Temp: 98.1 F (36.7 C)  Resp: 16   There were no vitals filed for this visit.  GENERAL:alert, no distress and comfortable SKIN: skin color, texture, turgor are normal, no rashes or significant lesions EYES: normal, Conjunctiva are pink and non-injected, sclera clear OROPHARYNX:no exudate, no erythema and lips, buccal mucosa, and tongue normal  NECK: supple, thyroid normal size, non-tender, without nodularity LYMPH:  no palpable lymphadenopathy in the cervical, axillary or inguinal LUNGS: clear to auscultation and percussion with normal breathing effort HEART: regular rate & rhythm and no murmurs and no lower extremity edema ABDOMEN:abdomen soft, non-tender and normal bowel sounds Musculoskeletal:no cyanosis of digits and no clubbing  NEURO: alert & oriented x 3 with fluent speech, no focal motor/sensory deficits  LABORATORY DATA:  I have reviewed the data as listed   Chemistry      Component Value Date/Time   NA 142 06/14/2014 1253   NA 143 05/30/2014 1212   K 3.7 06/14/2014 1253   K 3.5 05/30/2014 1212   CL 100 06/14/2014 1253   CO2 25 06/14/2014 1253   CO2 28  05/30/2014 1212   BUN 15 06/14/2014 1253   BUN 14.6 05/30/2014 1212   CREATININE 0.57 06/14/2014 1253   CREATININE 0.7 05/30/2014 1212      Component Value Date/Time   CALCIUM 10.0 06/14/2014 1253   CALCIUM 9.9 05/30/2014 1212   ALKPHOS 75 05/30/2014 1212   ALKPHOS 63 07/17/2013 1142   AST 26 05/30/2014 1212   AST 24 07/17/2013 1142   ALT 34 05/30/2014 1212   ALT 29 07/17/2013 1142   BILITOT 0.49 05/30/2014 1212   BILITOT 0.6 07/17/2013 1142       Lab Results  Component Value Date   WBC 7.4 06/14/2014   HGB 15.2* 06/14/2014   HCT 46.5* 06/14/2014   MCV 91.4 06/14/2014   PLT 209 06/14/2014   NEUTROABS 3.6 05/30/2014   ASSESSMENT & PLAN:  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 Oncotype DX recurrence score 16; 10% risk of recurrence, low risk did not  need chemotherapy status post radiation therapy completed January 2016, started antiestrogen therapy with Arimidex 1 mg daily 09/07/2014  Arimidex toxicities: Patient has chronic muscle stiffness and aches. Because of these symptoms be switched her to letrozole.   Cellulitis underneath the leg: Resolved  Breast Cancer Surveillance: 1. Breast exam 06/25/2015: Left breast nipple retraction and tenderness along the surgical scar with nodularity along the scar but beyond that there are no lumps or nodules in the breasts. 2. Mammogram 06/28/2014 at [facility] No abnormalities. Postsurgical changes. I recommended that she get 3-D mammograms for surveillance.  Return to clinic in 3 months for follow-up on letrozole.   Orders Placed This Encounter  Procedures  . MM Digital Diagnostic Bilat    Standing Status: Future     Number of Occurrences:      Standing Expiration Date: 06/24/2016    Order Specific Question:  Reason for Exam (SYMPTOM  OR DIAGNOSIS REQUIRED)    Answer:  Annual Mammogram  with H/O breast cancer    Order Specific Question:  Preferred imaging location?    Answer:  [facility]  . DG Bone Density    Please do it at same time as Mammograms    Standing Status: Future     Number of Occurrences:      Standing Expiration Date: 06/24/2016    Order Specific Question:  Reason for Exam (SYMPTOM  OR DIAGNOSIS REQUIRED)    Answer:  Post menopausal Osteoporosis evaluation    Order Specific Question:  Preferred imaging location?    Answer:  [facility]   The patient has a good understanding of the overall plan. she agrees with it. she will call with any problems that may develop before the next visit here.   Rulon Eisenmenger, MD 06/25/2015

## 2015-06-25 NOTE — Assessment & Plan Note (Signed)
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 Oncotype DX recurrence score 16; 10% risk of recurrence, low risk did not need chemotherapy status post radiation therapy completed January 2016, started antiestrogen therapy with Arimidex 1 mg daily 09/07/2014  Arimidex toxicities:no major side effects from treatment. Patient has chronic muscle stiffness and aches which are unchanged. Very mild hot flashes especially after dinner  Cellulitis underneath the leg: Resolved  Breast Cancer Surveillance: 1. Breast exam 06/25/2015: Left breast nipple retraction and tenderness along the surgical scar with nodularity along the scar but beyond that there are no lumps or nodules in the breasts. 2. Mammogram 06/28/2014 at [facility] No abnormalities. Postsurgical changes. I recommended that she get 3-D mammograms for surveillance.  Return to clinic in 6 months for follow-up.

## 2015-07-02 ENCOUNTER — Other Ambulatory Visit: Payer: Self-pay | Admitting: *Deleted

## 2015-07-02 MED ORDER — OXYCODONE-ACETAMINOPHEN 10-325 MG PO TABS: 1.0000 | ORAL_TABLET | Freq: Three times a day (TID) | ORAL | Status: DC

## 2015-07-02 NOTE — Telephone Encounter (Signed)
Rx ready to pick up and husband is aware

## 2015-07-15 ENCOUNTER — Other Ambulatory Visit: Payer: Self-pay | Admitting: Family Medicine

## 2015-07-24 ENCOUNTER — Other Ambulatory Visit (INDEPENDENT_AMBULATORY_CARE_PROVIDER_SITE_OTHER): Payer: PPO

## 2015-07-24 DIAGNOSIS — Z0000 Encounter for general adult medical examination without abnormal findings: Secondary | ICD-10-CM | POA: Diagnosis not present

## 2015-07-24 LAB — CBC WITH DIFFERENTIAL/PLATELET
Basophils Absolute: 0 10*3/uL (ref 0.0–0.1)
Basophils Relative: 0.5 % (ref 0.0–3.0)
Eosinophils Absolute: 0.1 10*3/uL (ref 0.0–0.7)
Eosinophils Relative: 1.7 % (ref 0.0–5.0)
HCT: 47 % — ABNORMAL HIGH (ref 36.0–46.0)
HEMOGLOBIN: 15.5 g/dL — AB (ref 12.0–15.0)
LYMPHS PCT: 36.5 % (ref 12.0–46.0)
Lymphs Abs: 2.8 10*3/uL (ref 0.7–4.0)
MCHC: 33 g/dL (ref 30.0–36.0)
MCV: 89.5 fl (ref 78.0–100.0)
MONO ABS: 0.6 10*3/uL (ref 0.1–1.0)
MONOS PCT: 7.6 % (ref 3.0–12.0)
NEUTROS PCT: 53.7 % (ref 43.0–77.0)
Neutro Abs: 4.1 10*3/uL (ref 1.4–7.7)
PLATELETS: 240 10*3/uL (ref 150.0–400.0)
RBC: 5.25 Mil/uL — ABNORMAL HIGH (ref 3.87–5.11)
RDW: 13.4 % (ref 11.5–15.5)
WBC: 7.6 10*3/uL (ref 4.0–10.5)

## 2015-07-24 LAB — POCT URINALYSIS DIPSTICK
Bilirubin, UA: NEGATIVE
Glucose, UA: NEGATIVE
KETONES UA: NEGATIVE
Nitrite, UA: NEGATIVE
PH UA: 7
PROTEIN UA: NEGATIVE
Spec Grav, UA: 1.015
Urobilinogen, UA: 0.2

## 2015-07-24 LAB — HEPATIC FUNCTION PANEL
ALT: 26 U/L (ref 0–35)
AST: 22 U/L (ref 0–37)
Albumin: 4.4 g/dL (ref 3.5–5.2)
Alkaline Phosphatase: 78 U/L (ref 39–117)
BILIRUBIN TOTAL: 0.6 mg/dL (ref 0.2–1.2)
Bilirubin, Direct: 0.1 mg/dL (ref 0.0–0.3)
Total Protein: 7.5 g/dL (ref 6.0–8.3)

## 2015-07-24 LAB — BASIC METABOLIC PANEL
BUN: 13 mg/dL (ref 6–23)
CALCIUM: 9.9 mg/dL (ref 8.4–10.5)
CO2: 29 mEq/L (ref 19–32)
CREATININE: 0.65 mg/dL (ref 0.40–1.20)
Chloride: 103 mEq/L (ref 96–112)
GFR: 97.47 mL/min (ref >60.00)
Glucose, Bld: 92 mg/dL (ref 70–99)
Potassium: 3.3 mEq/L — ABNORMAL LOW (ref 3.5–5.1)
SODIUM: 142 meq/L (ref 135–145)

## 2015-07-24 LAB — LIPID PANEL
Cholesterol: 171 mg/dL (ref 0–200)
HDL: 53.9 mg/dL (ref >39.00)
LDL Cholesterol: 91 mg/dL (ref 0–99)
NONHDL: 117.5
Total CHOL/HDL Ratio: 3
Triglycerides: 134 mg/dL (ref 0.0–149.0)
VLDL: 26.8 mg/dL (ref 0.0–40.0)

## 2015-07-24 LAB — TSH: TSH: 1.83 u[IU]/mL (ref 0.35–4.50)

## 2015-07-29 ENCOUNTER — Encounter: Payer: Self-pay | Admitting: Family Medicine

## 2015-07-29 ENCOUNTER — Ambulatory Visit (INDEPENDENT_AMBULATORY_CARE_PROVIDER_SITE_OTHER): Payer: PPO | Admitting: Family Medicine

## 2015-07-29 VITALS — BP 130/90 | Temp 98.2°F | Ht 62.0 in | Wt 180.0 lb

## 2015-07-29 DIAGNOSIS — Z0000 Encounter for general adult medical examination without abnormal findings: Secondary | ICD-10-CM | POA: Diagnosis not present

## 2015-07-29 DIAGNOSIS — D332 Benign neoplasm of brain, unspecified: Secondary | ICD-10-CM

## 2015-07-29 DIAGNOSIS — I6310 Cerebral infarction due to embolism of unspecified precerebral artery: Secondary | ICD-10-CM

## 2015-07-29 DIAGNOSIS — E785 Hyperlipidemia, unspecified: Secondary | ICD-10-CM

## 2015-07-29 DIAGNOSIS — I10 Essential (primary) hypertension: Secondary | ICD-10-CM | POA: Diagnosis not present

## 2015-07-29 DIAGNOSIS — G8320 Monoplegia of upper limb affecting unspecified side: Secondary | ICD-10-CM

## 2015-07-29 MED ORDER — ATENOLOL 50 MG PO TABS: 50.0000 mg | ORAL_TABLET | Freq: Every day | ORAL | Status: DC

## 2015-07-29 MED ORDER — DIAZEPAM 5 MG PO TABS: ORAL_TABLET | ORAL | Status: DC

## 2015-07-29 MED ORDER — ATORVASTATIN CALCIUM 10 MG PO TABS: 10.0000 mg | ORAL_TABLET | Freq: Every day | ORAL | Status: DC

## 2015-07-29 MED ORDER — OXYCODONE-ACETAMINOPHEN 5-325 MG PO TABS: 2.0000 | ORAL_TABLET | Freq: Three times a day (TID) | ORAL | Status: DC

## 2015-07-29 MED ORDER — LORAZEPAM 1 MG PO TABS: 1.0000 mg | ORAL_TABLET | Freq: Two times a day (BID) | ORAL | Status: DC | PRN

## 2015-07-29 NOTE — Progress Notes (Signed)
   Subjective:    Patient ID: Stacy Diaz, female    DOB: 07-Nov-1950, 64 y.o.   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 She is confined to a wheelchair. She's followed by neurology now.  She takes Tenoretic and 20 mEq of potassium however potassium level is low and she's having a lot of leg cramps. Will switch to plain Tenormin and stopped the potassium  She's also had a stroke and is on Plavix.  She's on medication from her oncologist because of breast cancer. She finished her radiation therapy in January 2016.  She gets routine eye care, dental care, BSE monthly, annual mammography, candidate for shingles vaccine. Asked her to call her insurance company.  There frustrated because her insurance won't cover her Robaxin anymore.   Review of Systems  Constitutional: Negative.   HENT: Negative.   Eyes: Negative.   Respiratory: Negative.   Cardiovascular: Negative.   Gastrointestinal: Negative.   Endocrine: Negative.   Genitourinary: Negative.   Musculoskeletal: Negative.   Skin: Negative.   Allergic/Immunologic: Negative.   Neurological: Negative.   Hematological: Negative.   Psychiatric/Behavioral: Negative.        Objective:   Physical Exam  Constitutional: She is oriented to person, place, and time. She appears well-developed and well-nourished.  HENT:  Head: Normocephalic and atraumatic.  Right Ear: External ear normal.  Left Ear: External ear normal.  Nose: Nose normal.  Mouth/Throat: Oropharynx is clear and moist.  Eyes: EOM are normal. Pupils are equal, round, and reactive to light.  Neck: Normal range of motion. Neck supple. No JVD present. No tracheal deviation present. No thyromegaly present.    Cardiovascular: Normal rate, regular rhythm, normal heart sounds and intact distal pulses.  Exam reveals no gallop and no friction rub.   No murmur heard. Pulmonary/Chest: Effort normal and breath sounds normal. No stridor. No respiratory distress. She has no wheezes. She has no rales. She exhibits no tenderness.  Abdominal: Soft. Bowel sounds are normal. She exhibits no distension and no mass. There is no tenderness. There is no rebound and no guarding.  Genitourinary:  Deferred. Still has urinary incontinence would like a urology consult  Right breast normal left breast shows scarring from previous biopsy and radiation treatment  Musculoskeletal: Normal range of motion. She exhibits no edema or tenderness.  Lymphadenopathy:    She has no cervical adenopathy.  Neurological: She is alert and oriented to person, place, and time. No cranial nerve deficit.  TOTAL PARALYSIS OF LOWER EXTREMITIES  Skin: Skin is warm and dry. No rash noted. No erythema. No pallor.  Psychiatric: She has a normal mood and affect. Her behavior is normal. Judgment and thought content normal.  Nursing note and vitals reviewed.         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 stop Tenoretic....... switch to Tenormin plain....... 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 I can't sick in any other option besides a permanent Foley however we'll ask Dr. Sherren Mocha  to see her for consult  Chronic pain............ continue Percocet

## 2015-07-29 NOTE — Progress Notes (Signed)
Pre visit review using our clinic review tool, if applicable. No additional management support is needed unless otherwise documented below in the visit note. 

## 2015-07-29 NOTE — Patient Instructions (Signed)
Percocet 5 mg,,,,,,,,,,, 2 tabs 3 times daily  Stop the potassium and the Tenoretic  Start Tenormin 50 mg,,,,,,, 1 daily in the morning  Return in one month for blood pressure follow-up

## 2015-07-30 ENCOUNTER — Ambulatory Visit
Admission: RE | Admit: 2015-07-30 | Discharge: 2015-07-30 | Disposition: A | Discharge status: Home / Self Care | Payer: PPO | Source: Ambulatory Visit | Attending: Hematology and Oncology | Admitting: Hematology and Oncology

## 2015-07-30 ENCOUNTER — Other Ambulatory Visit: Payer: Self-pay | Admitting: Hematology and Oncology

## 2015-07-30 DIAGNOSIS — C50512 Malignant neoplasm of lower-outer quadrant of left female breast: Secondary | ICD-10-CM

## 2015-08-21 ENCOUNTER — Telehealth: Payer: Self-pay | Admitting: Neurology

## 2015-08-21 NOTE — Telephone Encounter (Signed)
Pt's husband called said methocarbamol (ROBAXIN) 500 MG tablet has went from $5/mth to $85/mth in 2017. He said she was started on this medication in 2014 after her stroke when she was in rehab facility. He said she has full upper body movement now and is inquiring if she really needs to take it all.

## 2015-08-21 NOTE — Telephone Encounter (Signed)
Rn call patient back about the Robaxin medication co payment increasing. Pt stated she wanted to know if she still needs to continue the drug being that her full upper body movement is doing so well. Also she wanted to know if there was a assistance medication with the drug. Rn stated a message will be sent to Jessica(pharmacist0 and Dr.Sethi. PT would like a call back from [place].

## 2015-08-22 NOTE — Telephone Encounter (Signed)
I spoke to patient's husband [name]. He informed me that patient had trouble affording Robaxin but it appears that she will not having muscle spasms or tightness and hence may try to come off the medication. I recommend she reduce the dose to twice a day for 2 weeks and then once a day for 2 weeks and stop. He expressed understanding

## 2015-08-25 ENCOUNTER — Telehealth: Payer: Self-pay | Admitting: Family Medicine

## 2015-08-25 NOTE — Telephone Encounter (Signed)
Pts BP has been running high at 154/89 since changing her medication. Please call patient to discuss.

## 2015-08-26 ENCOUNTER — Ambulatory Visit: Payer: PPO | Admitting: Family Medicine

## 2015-08-26 NOTE — Telephone Encounter (Signed)
Per Dr Sherren Mocha if the patient's heart rate is above and stays at 60 the one tab in the am and half tab in the pm.  Husband is aware.  Patient has a follow up blood pressure in 1 week.

## 2015-09-03 ENCOUNTER — Ambulatory Visit (INDEPENDENT_AMBULATORY_CARE_PROVIDER_SITE_OTHER): Payer: PPO | Admitting: Family Medicine

## 2015-09-03 ENCOUNTER — Encounter: Payer: Self-pay | Admitting: Family Medicine

## 2015-09-03 VITALS — BP 124/80 | Temp 98.3°F

## 2015-09-03 DIAGNOSIS — I10 Essential (primary) hypertension: Secondary | ICD-10-CM

## 2015-09-03 MED ORDER — ATENOLOL 50 MG PO TABS: 50.0000 mg | ORAL_TABLET | Freq: Every day | ORAL | Status: DC

## 2015-09-03 NOTE — Progress Notes (Signed)
Pre visit review using our clinic review tool, if applicable. No additional management support is needed unless otherwise documented below in the visit note. 

## 2015-09-03 NOTE — Progress Notes (Signed)
   Subjective:    Patient ID: Stacy Diaz, female    DOB: 05/22/1951, 65 y.o.   MRN: [id number]  HPI Stacy Diaz is a delightful 65 year old married female nonsmoker who comes in today for evaluation of hypertension. She was on Tenormin 50 mg daily. Blood pressure was not at goal. Therefore I to 25 mg at bedtime. BP now is 124/80 pulse is 60 and regular. No side effects from increasing dose  We talked about long-term care. She's due in December for her physical examination. Because of underlying disability and possible knee I feel she needs to establish with one of the new folks   Review of Systems Review of systems otherwise negative    Objective:   Physical Exam  Well-developed well-nourished female no acute distress vital signs stable she's afebrile BP is 124/80 pulse 60 and regular      Assessment & Plan:  Hypertension at goal.......... continue current therapy follow-up in December 2017 annual exam with one of the new folks

## 2015-09-03 NOTE — Patient Instructions (Signed)
Tenormin 50 mg.........Marland Kitchen 1 tab in the morning.......... half a tab at bedtime  Check your blood pressure weekly to be sure it stays normal 140/90 or less  Call in July to set up your physical exam in December with one of the new folks,,,,,,,,,,, [name] or Almyra Free our nurse practitioners that see only adults or Dr. [place]

## 2015-09-04 ENCOUNTER — Other Ambulatory Visit: Payer: Self-pay | Admitting: *Deleted

## 2015-09-04 MED ORDER — ATENOLOL 50 MG PO TABS: ORAL_TABLET | ORAL | Status: DC

## 2015-09-20 ENCOUNTER — Other Ambulatory Visit: Payer: Self-pay | Admitting: Family Medicine

## 2015-09-22 ENCOUNTER — Other Ambulatory Visit: Payer: Self-pay | Admitting: Family Medicine

## 2015-09-24 ENCOUNTER — Encounter: Payer: Self-pay | Admitting: Hematology and Oncology

## 2015-09-24 ENCOUNTER — Telehealth: Payer: Self-pay | Admitting: Hematology and Oncology

## 2015-09-24 ENCOUNTER — Ambulatory Visit (HOSPITAL_BASED_OUTPATIENT_CLINIC_OR_DEPARTMENT_OTHER): Payer: PPO | Admitting: Hematology and Oncology

## 2015-09-24 VITALS — BP 142/68 | HR 67 | Temp 98.3°F | Resp 18

## 2015-09-24 DIAGNOSIS — Z170 Estrogen receptor positive status [ER+]: Secondary | ICD-10-CM

## 2015-09-24 DIAGNOSIS — C50512 Malignant neoplasm of lower-outer quadrant of left female breast: Secondary | ICD-10-CM | POA: Diagnosis not present

## 2015-09-24 DIAGNOSIS — M791 Myalgia: Secondary | ICD-10-CM | POA: Diagnosis not present

## 2015-09-24 DIAGNOSIS — M2550 Pain in unspecified joint: Secondary | ICD-10-CM

## 2015-09-24 NOTE — Progress Notes (Signed)
Patient Care Team: Dorena Cookey, MD as PCP - [place] III, MD as Consulting Physician (General Surgery) Nicholas Lose, MD as Consulting Physician (Hematology and Oncology) Kyung Rudd, MD as Consulting Physician (Radiation Oncology)  DIAGNOSIS: Breast cancer of lower-outer quadrant of left female breast [facility])   Staging form: Breast, AJCC 7th Edition     Clinical: Stage IA (T1c, N0, cM0) - Unsigned       Staging comments: Staged at breast conference 05/30/14.      Pathologic: Stage IIA (T2, N0, cM0) - Unsigned   SUMMARY OF ONCOLOGIC HISTORY:   Breast cancer of lower-outer quadrant of left female breast ([place])   05/21/2014 Mammogram Mammogram and Ultrasound: Left breast 2 cm complex cystic asymmetric abnormality   05/25/2014 Initial Diagnosis Invasive mammary cancer with lobular features with perineural invasion, grade 2, ER positive PR positive HER-2 negative, Ki-67 25%   06/22/2014 Surgery Breast lumpectomy: Invasive lobular carcinoma, grade 3, 2.7 cm, with LCIS, ER positive, PR positive, HER-2 negative, Ki-67 65%, T2, N0, M0 stage II A3 SLN negative; oncotype 16 low risk 10% ROR   07/30/2014 - 08/28/2014 Radiation Therapy Adjuvant radiation   09/07/2014 -  Anti-estrogen oral therapy Anastrazole 1 mg daily switch to letrozole 06/25/2015 for myalgias   CHIEF COMPLIANT:  Follow-up on letrozole  INTERVAL HISTORY: Stacy Diaz is a  65 year old with above-mentioned history of breast cancer currently on letrozole and she appears to be tolerating this much better than anastrozole.  She continues to have some muscle aches and stiffness but may be less frequent than she was on anastrozole. She is on muscle relaxants which do help her significantly. She does take assessment into it bedtime to help her sleep.  REVIEW OF SYSTEMS:   Constitutional: Denies fevers, chills or abnormal weight loss Eyes: Denies blurriness of vision Ears, nose, mouth, throat, and face: Denies mucositis or sore  throat Respiratory: Denies cough, dyspnea or wheezes Cardiovascular: Denies palpitation, chest discomfort Gastrointestinal:  Denies nausea, heartburn or change in bowel habits Skin: Denies abnormal skin rashes Lymphatics: Denies new lymphadenopathy or easy bruising Neurological: uses wheelchair for  ambulation complains of myalgias and arthralgias and stiffness Behavioral/Psych: Mood is stable, no new changes  Extremities: No lower extremity edema Breast:  denies any pain or lumps or nodules in either breasts All other systems were reviewed with the patient and are negative.  I have reviewed the past medical history, past surgical history, social history and family history with the patient and they are unchanged from previous note.  ALLERGIES:  has No Known Allergies.  MEDICATIONS:  Current Outpatient Prescriptions  Medication Sig Dispense Refill  . atenolol (TENORMIN) 50 MG tablet 1 tablet in the morning....... half a tab at bedtime 150 tablet 3  . atorvastatin (LIPITOR) 10 MG tablet Take 1 tablet (10 mg total) by mouth daily. 90 tablet 3  . baclofen (LIORESAL) 20 MG tablet TAKE 1 TABLET (20 MG TOTAL) BY MOUTH 4 (FOUR) TIMES DAILY. 360 tablet 1  . clopidogrel (PLAVIX) 75 MG tablet TAKE 1 TABLET BY MOUTH DAILY WITH BREAKFAST 90 tablet 2  . diazepam (VALIUM) 5 MG tablet TAKE 1 TABLET (5MG TOTAL) BY MOUTH AT BEDTIME 90 tablet 3  . docusate sodium (COLACE) 100 MG capsule Take 100 mg by mouth 2 (two) times daily.    Marland Kitchen lacosamide 100 MG TABS Take 1 tablet (100 mg total) by mouth 2 (two) times daily. 180 tablet 3  . letrozole (FEMARA) 2.5 MG tablet Take 1  tablet (2.5 mg total) by mouth daily. 90 tablet 3  . lidocaine (LIDODERM) 5 % Place 1 patch onto the skin daily as needed (back pain). Remove & Discard patch within 12 hours or as directed by MD    . LORazepam (ATIVAN) 1 MG tablet Take 1 tablet (1 mg total) by mouth 2 (two) times daily as needed. for anxiety 180 tablet 3  . Melatonin 3 MG  TABS Take 1 tablet (3 mg total) by mouth at bedtime. 90 tablet 3  . methocarbamol (ROBAXIN) 500 MG tablet Take 1 tablet (500 mg total) by mouth 3 (three) times daily. 300 tablet 3  . Multiple Vitamin (MULTIVITAMIN WITH MINERALS) TABS tablet Take 1 tablet by mouth daily.    Marland Kitchen OVER THE COUNTER MEDICATION Apply 1 application topically daily as needed (pain). Aspercreme with 5% lidocaine    . oxyCODONE-acetaminophen (PERCOCET/ROXICET) 5-325 MG tablet Take 2 tablets by mouth 3 (three) times daily. 180 tablet 0  . polyethylene glycol (MIRALAX / GLYCOLAX) packet Take 17 g by mouth daily as needed (for constipation). Mix with liquid and drink    . zinc oxide (BALMEX) 11.3 % CREA cream Apply 1 application topically at bedtime as needed.      No current facility-administered medications for this visit.    PHYSICAL EXAMINATION: ECOG PERFORMANCE STATUS: 1 - Symptomatic but completely ambulatory  Filed Vitals:   09/24/15 1402  BP: 142/68  Pulse: 67  Temp: 98.3 F (36.8 C)  Resp: 18   Filed Weights    GENERAL:alert, no distress and comfortable SKIN: skin color, texture, turgor are normal, no rashes or significant lesions EYES: normal, Conjunctiva are pink and non-injected, sclera clear OROPHARYNX:no exudate, no erythema and lips, buccal mucosa, and tongue normal  NECK: supple, thyroid normal size, non-tender, without nodularity LYMPH:  no palpable lymphadenopathy in the cervical, axillary or inguinal LUNGS: clear to auscultation and percussion with normal breathing effort HEART: regular rate & rhythm and no murmurs and no lower extremity edema ABDOMEN:abdomen soft, non-tender and normal bowel sounds MUSCULOSKELETAL:no cyanosis of digits and no clubbing  NEURO: alert & oriented x 3 with fluent speech, no focal motor/sensory deficits EXTREMITIES: No lower extremity edema  LABORATORY DATA:  I have reviewed the data as listed   Chemistry      Component Value Date/Time   NA 142 07/24/2015  1112   NA 143 05/30/2014 1212   K 3.3* 07/24/2015 1112   K 3.5 05/30/2014 1212   CL 103 07/24/2015 1112   CO2 29 07/24/2015 1112   CO2 28 05/30/2014 1212   BUN 13 07/24/2015 1112   BUN 14.6 05/30/2014 1212   CREATININE 0.65 07/24/2015 1112   CREATININE 0.7 05/30/2014 1212      Component Value Date/Time   CALCIUM 9.9 07/24/2015 1112   CALCIUM 9.9 05/30/2014 1212   ALKPHOS 78 07/24/2015 1112   ALKPHOS 75 05/30/2014 1212   AST 22 07/24/2015 1112   AST 26 05/30/2014 1212   ALT 26 07/24/2015 1112   ALT 34 05/30/2014 1212   BILITOT 0.6 07/24/2015 1112   BILITOT 0.49 05/30/2014 1212       Lab Results  Component Value Date   WBC 7.6 07/24/2015   HGB 15.5* 07/24/2015   HCT 47.0* 07/24/2015   MCV 89.5 07/24/2015   PLT 240.0 07/24/2015   NEUTROABS 4.1 07/24/2015   ASSESSMENT & PLAN:  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 Oncotype DX recurrence score 16; 10% risk of recurrence, low risk did not need chemotherapy status post radiation therapy completed January 2016, started antiestrogen therapy with Arimidex 1 mg daily 09/07/2014  Switched to letrozole November 2016.  letrozole toxicities: Patient has chronic muscle stiffness and aches.  These symptoms are much better on letrozole then on anastrozole. Cellulitis underneath the leg: Resolved Myalgias and arthralgias : I recommended that she will bring on water health it is a muscle aches and pains.  Breast Cancer Surveillance: 1. Breast exam02/02/2016: Left breast nipple retraction and tenderness along the surgical scar with nodularity along the scar but beyond that there are no lumps or nodules in the breasts. 2. Mammogram12/13/2016 No abnormalities. Postsurgical changes. Breast density category C. I recommended that she get 3-D mammograms for surveillance.  Return to clinic in 6  months for follow-up on letrozole.    No orders of the defined types were placed in this encounter.   The patient has a good understanding of the overall plan. she agrees with it. she will call with any problems that may develop before the next visit here.   Rulon Eisenmenger, MD 09/24/2015

## 2015-09-24 NOTE — Assessment & Plan Note (Signed)
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 Oncotype DX recurrence score 16; 10% risk of recurrence, low risk did not need chemotherapy status post radiation therapy completed January 2016, started antiestrogen therapy with Arimidex 1 mg daily 09/07/2014  Arimidex toxicities: Patient has chronic muscle stiffness and aches. Because of these symptoms be switched her to letrozole.  Cellulitis underneath the leg: Resolved  Breast Cancer Surveillance: 1. Breast exam02/02/2016: Left breast nipple retraction and tenderness along the surgical scar with nodularity along the scar but beyond that there are no lumps or nodules in the breasts. 2. Mammogram12/13/2016 No abnormalities. Postsurgical changes. Breast density category C. I recommended that she get 3-D mammograms for surveillance.  Return to clinic in 6 months for follow-up on letrozole.

## 2015-09-24 NOTE — Telephone Encounter (Signed)
Gave patient avs report and appointment for August  °

## 2015-10-02 ENCOUNTER — Other Ambulatory Visit: Payer: Self-pay | Admitting: *Deleted

## 2015-10-02 DIAGNOSIS — G8320 Monoplegia of upper limb affecting unspecified side: Secondary | ICD-10-CM

## 2015-10-02 MED ORDER — METHOCARBAMOL 500 MG PO TABS: 500.0000 mg | ORAL_TABLET | Freq: Three times a day (TID) | ORAL | Status: DC

## 2015-10-04 DIAGNOSIS — C50512 Malignant neoplasm of lower-outer quadrant of left female breast: Secondary | ICD-10-CM | POA: Diagnosis not present

## 2015-10-14 ENCOUNTER — Other Ambulatory Visit: Payer: Self-pay | Admitting: *Deleted

## 2015-10-14 DIAGNOSIS — G8320 Monoplegia of upper limb affecting unspecified side: Secondary | ICD-10-CM

## 2015-10-14 MED ORDER — OXYCODONE-ACETAMINOPHEN 5-325 MG PO TABS: 2.0000 | ORAL_TABLET | Freq: Three times a day (TID) | ORAL | Status: DC

## 2015-10-14 MED ORDER — METHOCARBAMOL 500 MG PO TABS: 500.0000 mg | ORAL_TABLET | Freq: Three times a day (TID) | ORAL | Status: DC

## 2015-10-14 NOTE — Telephone Encounter (Signed)
Okay per Dr Sherren Mocha. rx ready for pick up and patient is aware.

## 2016-01-03 DIAGNOSIS — H2513 Age-related nuclear cataract, bilateral: Secondary | ICD-10-CM | POA: Diagnosis not present

## 2016-01-06 ENCOUNTER — Other Ambulatory Visit: Payer: Self-pay

## 2016-01-06 ENCOUNTER — Telehealth: Payer: Self-pay | Admitting: Neurology

## 2016-01-06 MED ORDER — LACOSAMIDE 100 MG PO TABS: 100.0000 mg | ORAL_TABLET | Freq: Two times a day (BID) | ORAL | Status: DC

## 2016-01-06 NOTE — Telephone Encounter (Signed)
Waiting for patient's husband to bring proof of income for patient assistance.

## 2016-01-14 ENCOUNTER — Other Ambulatory Visit: Payer: Self-pay | Admitting: Family Medicine

## 2016-01-14 MED ORDER — OXYCODONE-ACETAMINOPHEN 5-325 MG PO TABS: 2.0000 | ORAL_TABLET | Freq: Three times a day (TID) | ORAL | Status: DC

## 2016-01-14 NOTE — Telephone Encounter (Signed)
Pt needs new rx oxycodone # 540 for 90 day supply. Pt take 2 pills three times a day

## 2016-01-14 NOTE — Telephone Encounter (Signed)
Patient's husband [name]) aware of refill and plans to pick up this afternoon

## 2016-01-16 NOTE — Telephone Encounter (Signed)
Spouse called to advise, "patient assistance for VIMPAT was denied for this year by manufacturer. Needs refill. Wants to find out about other drugs in same family that's less expensive and will they work the same?"

## 2016-01-21 NOTE — Telephone Encounter (Signed)
Patient is calling back about VIMPAT replacement medications.  Please call.

## 2016-01-21 NOTE — Telephone Encounter (Signed)
Dr. Leonie Man  and Stacy Diaz patient was denied because of income . Patient's husband relayed he will be willing to pay for RX. Patient's husband relayed he would be willing to try something else but he is afraid because she has done so well on VIMPAT. Patient has a few weeks of vimpat left.

## 2016-01-21 NOTE — Telephone Encounter (Signed)
Recommend changing to keppra 500 mg twice daily if she is willing. She has tried it in past

## 2016-01-22 ENCOUNTER — Other Ambulatory Visit: Payer: Self-pay

## 2016-01-22 ENCOUNTER — Other Ambulatory Visit: Payer: Self-pay | Admitting: Neurology

## 2016-01-22 DIAGNOSIS — R569 Unspecified convulsions: Secondary | ICD-10-CM

## 2016-01-22 MED ORDER — OXCARBAZEPINE 300 MG PO TABS: 150.0000 mg | ORAL_TABLET | Freq: Two times a day (BID) | ORAL | Status: DC

## 2016-01-22 NOTE — Telephone Encounter (Addendum)
Rn call patients husband Stacy Diaz on the [facility] form. Rn stated per Dr. Leonie Man instructions on vimpat and trileptal. Rn stated the first  2 weeks patient will take vimpat one tablet twice a day, along with  1/2 tabllet of trileptal twice daily. Pts husband was writing this down and verbalized understanding of the first 2 weeks.  Rn stated the 3rd and 4th week, per Dr. Leonie Man instructions pt will take one tablet of  vimpat daily, and than discontinue vimpat on the last day of the 4th week. Also on the 3rd and 4th week pt will take one whole tablet of trileptal twice a day. On week five patient will continue taking trileptal one tablet twice a day. Pts husband verbalized the 3rd and 4th week regimen. Rn stated if he wife have any issus with the trilpetal to call [facility]. Pts husband verbalized understanding. He will call back if patient has any major side effects. Pts husband stated the trileptal was only 0800 dollars with her insurance.

## 2016-01-22 NOTE — Telephone Encounter (Signed)
Rn call patients Stacy Diaz husband about her patients assistance being denied last month for vimapt. Pt has been on vimpat since 2015 and has been doing well with no seizure activity.Stacy Diaz stated the vimapt is in a tier 4 and would cost her over 150.00 every 90 days. Patients husband stated that he look up some other seizure medications in tier one and tier two that are cheaper. He stated tegretol, oxcarbazepine, and phenytoin were in the same category as vimpat. Pts husband stated his wife took keppra and she did not tolerate it and still have seizures. Pts husband stated if Dr. Leonie Man can review the three medications and see if they have the same uses as vimpat, they would change. If Dr.Sethi feels these drugs are not appropriate for his wife they will just have to pay the high co payment for vimapt because she is doing well on it. Rn stated message will be sent to Dr. Leonie Man.

## 2016-01-22 NOTE — Telephone Encounter (Signed)
LACOSAMIDE     Garvin Fila, MD  01/22/2016 4:32 PM :     Advised to reduce Vimpat to 1 tablet daily after 2 weeks of starting Trileptal and then discontinue after 4 weeks

## 2016-01-22 NOTE — Telephone Encounter (Signed)
Since patient cannot afford Vimpat will switch to Trileptal which is covered by patient's insurance.

## 2016-02-16 ENCOUNTER — Other Ambulatory Visit: Payer: Self-pay | Admitting: Family Medicine

## 2016-02-19 ENCOUNTER — Telehealth: Payer: Self-pay | Admitting: Neurology

## 2016-02-19 NOTE — Telephone Encounter (Signed)
Rn call patients husband [name] about his wife new medication she has been on since first of June 2017. Pt was change to trilpetal from vimpat because of not qualifying for the patient assistance program.  Pt was wean from vimapt to trilpetal see phone note 01/06/2016. Pts husband his wife states the medication trilpetal is causing muscle aches. PTs husband thinks its all psychological with his wife. PTs husband [name] states his wife takes baclofen, robaxin, and pain medicines for her spasticity and pain. PT has been on these medications for long time. Pts husband stated his wife does not deal with change well. Pts husband thinks its working. [name] would like to hear from [place]. Message will be sent to [place] for review and evaluation.Marland Kitchen

## 2016-02-19 NOTE — Telephone Encounter (Signed)
Pt's husband called in requesting to speak with physician. Pt was started on new medication and is having a hard time with it . Please call [phone]

## 2016-02-19 NOTE — Telephone Encounter (Signed)
I returned the patient's husband's call. His concern about increased muscle stiffness and spasticity I assured him that this is not a known side effect of Trileptal and to continue it but instead trying increasing the dose of baclofen to 20 mg twice a day and 30 mg twice a day alternating doses for 1 week and if required increase further to 30 mg 4 times daily. He voiced understanding and was advised to call me back in 2 weeks to let me know about her progress

## 2016-02-21 NOTE — Telephone Encounter (Signed)
Pt calling in out of medication.  Rx diazepam   Pharm: [facility]

## 2016-03-05 ENCOUNTER — Other Ambulatory Visit: Payer: Self-pay | Admitting: Family Medicine

## 2016-03-11 ENCOUNTER — Other Ambulatory Visit: Payer: Self-pay | Admitting: *Deleted

## 2016-03-11 MED ORDER — LORAZEPAM 1 MG PO TABS: 1.0000 mg | ORAL_TABLET | Freq: Two times a day (BID) | ORAL | 0 refills | Status: DC | PRN

## 2016-03-24 ENCOUNTER — Encounter: Payer: Self-pay | Admitting: Hematology and Oncology

## 2016-03-24 ENCOUNTER — Ambulatory Visit (HOSPITAL_BASED_OUTPATIENT_CLINIC_OR_DEPARTMENT_OTHER): Payer: PPO | Admitting: Hematology and Oncology

## 2016-03-24 ENCOUNTER — Telehealth: Payer: Self-pay | Admitting: Hematology and Oncology

## 2016-03-24 DIAGNOSIS — M791 Myalgia: Secondary | ICD-10-CM | POA: Diagnosis not present

## 2016-03-24 DIAGNOSIS — C50512 Malignant neoplasm of lower-outer quadrant of left female breast: Secondary | ICD-10-CM | POA: Diagnosis not present

## 2016-03-24 DIAGNOSIS — Z170 Estrogen receptor positive status [ER+]: Secondary | ICD-10-CM | POA: Diagnosis not present

## 2016-03-24 DIAGNOSIS — M2550 Pain in unspecified joint: Secondary | ICD-10-CM | POA: Diagnosis not present

## 2016-03-24 DIAGNOSIS — Z79811 Long term (current) use of aromatase inhibitors: Secondary | ICD-10-CM

## 2016-03-24 NOTE — Telephone Encounter (Signed)
appt made and avs printed °

## 2016-03-24 NOTE — Assessment & Plan Note (Signed)
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 Oncotype DX recurrence score 16; 10% risk of recurrence, low risk did not need chemotherapy status post radiation therapy completed January 2016, started antiestrogen therapy with Arimidex 1 mg daily 09/07/2014  Switched to letrozole November 2016.  letrozole toxicities: Patient has chronic muscle stiffness and aches.  These symptoms are much better on letrozole then on anastrozole. Cellulitis underneath the leg: Resolved Myalgias and arthralgias : tried tonic water Breast Cancer Surveillance: 1. Breast exam02/02/2016: Left breast nipple retraction and tenderness along the surgical scar with nodularity along the scar but beyond that there are no lumps or nodules in the breasts. 2. Mammogram12/13/2016 No abnormalities. Postsurgical changes. Breast density category C. I recommended that she get 3-D mammograms for surveillance.  Return to clinic in 6 months for follow-up on letrozole.

## 2016-03-24 NOTE — Progress Notes (Signed)
Patient Care Team: Dorena Cookey, MD as PCP - [name] III, MD as Consulting Physician (General Surgery) Nicholas Lose, MD as Consulting Physician (Hematology and Oncology) Kyung Rudd, MD as Consulting Physician (Radiation Oncology)  DIAGNOSIS: Breast cancer of lower-outer quadrant of left female breast [facility])   Staging form: Breast, AJCC 7th Edition   - Clinical: Stage IA (T1c, N0, cM0) - Unsigned         Staging comments: Staged at breast conference 05/30/14.    - Pathologic: Stage IIA (T2, N0, cM0) - Unsigned  SUMMARY OF ONCOLOGIC HISTORY:   Breast cancer of lower-outer quadrant of left female breast ([place])   05/21/2014 Mammogram    Mammogram and Ultrasound: Left breast 2 cm complex cystic asymmetric abnormality     05/25/2014 Initial Diagnosis    Invasive mammary cancer with lobular features with perineural invasion, grade 2, ER positive PR positive HER-2 negative, Ki-67 25%     06/22/2014 Surgery    Breast lumpectomy: Invasive lobular carcinoma, grade 3, 2.7 cm, with LCIS, ER positive, PR positive, HER-2 negative, Ki-67 65%, T2, N0, M0 stage II A3 SLN negative; oncotype 16 low risk 10% ROR     07/30/2014 - 08/28/2014 Radiation Therapy    Adjuvant radiation     09/07/2014 -  Anti-estrogen oral therapy    Anastrazole 1 mg daily switch to letrozole 06/25/2015 for myalgias      CHIEF COMPLIANT: Follow-up on letrozole therapy  INTERVAL HISTORY: Stacy Diaz is a 65 year old with above-mentioned history of left breast cancer treated with lumpectomy radiation is currently on letrozole therapy. Recently there has been a change in her medication and she is feeling more stiffer. She has noticed lot more discomfort in her extremities especially in the morning everything needs to be stretched otherwise she feels extremely stiff.  REVIEW OF SYSTEMS:   Constitutional: Denies fevers, chills or abnormal weight loss Eyes: Denies blurriness of vision Ears, nose, mouth, throat,  and face: Denies mucositis or sore throat Respiratory: Denies cough, dyspnea or wheezes Cardiovascular: Denies palpitation, chest discomfort Gastrointestinal:  Constipation and diarrhea Skin: Denies abnormal skin rashes Lymphatics: Denies new lymphadenopathy or easy bruising Neurological: Generalized weakness uses mechanical wheelchair for ambulation Behavioral/Psych: Mood is stable, no new changes  Extremities: No lower extremity edema Breast: Scar tissue feeling nodular and inferior aspect of the left breast All other systems were reviewed with the patient and are negative.  I have reviewed the past medical history, past surgical history, social history and family history with the patient and they are unchanged from previous note.  ALLERGIES:  has No Known Allergies.  MEDICATIONS:  Current Outpatient Prescriptions  Medication Sig Dispense Refill  . atenolol (TENORMIN) 50 MG tablet 1 tablet in the morning....... half a tab at bedtime 150 tablet 3  . atorvastatin (LIPITOR) 10 MG tablet Take 1 tablet (10 mg total) by mouth daily. 90 tablet 3  . baclofen (LIORESAL) 20 MG tablet TAKE 1 TABLET (20 MG TOTAL) BY MOUTH 4 (FOUR) TIMES DAILY. 360 tablet 2  . clopidogrel (PLAVIX) 75 MG tablet TAKE 1 TABLET BY MOUTH DAILY WITH BREAKFAST 90 tablet 2  . diazepam (VALIUM) 5 MG tablet TAKE 1 TABLET BY MOUTH AT BEDTIME 90 tablet 1  . docusate sodium (COLACE) 100 MG capsule Take 100 mg by mouth 2 (two) times daily.    Marland Kitchen letrozole (FEMARA) 2.5 MG tablet Take 1 tablet (2.5 mg total) by mouth daily. 90 tablet 3  . lidocaine (LIDODERM) 5 %  Place 1 patch onto the skin daily as needed (back pain). Remove & Discard patch within 12 hours or as directed by MD    . LORazepam (ATIVAN) 1 MG tablet Take 1 tablet (1 mg total) by mouth 2 (two) times daily as needed. for anxiety 180 tablet 0  . Melatonin 3 MG TABS Take 1 tablet (3 mg total) by mouth at bedtime. 90 tablet 3  . methocarbamol (ROBAXIN) 500 MG tablet Take 1  tablet (500 mg total) by mouth 3 (three) times daily. 300 tablet 3  . methocarbamol (ROBAXIN) 500 MG tablet Take 1 tablet (500 mg total) by mouth 3 (three) times daily. 300 tablet 3  . Multiple Vitamin (MULTIVITAMIN WITH MINERALS) TABS tablet Take 1 tablet by mouth daily.    Marland Kitchen OVER THE COUNTER MEDICATION Apply 1 application topically daily as needed (pain). Aspercreme with 5% lidocaine    . Oxcarbazepine (TRILEPTAL) 300 MG tablet Take 0.5 tablets (150 mg total) by mouth 2 (two) times daily. Start with 1/2 tablet twice daily x 2 weeks then one tablet twice daily 180 tablet 3  . oxyCODONE-acetaminophen (PERCOCET/ROXICET) 5-325 MG tablet Take 2 tablets by mouth 3 (three) times daily. 540 tablet 0  . polyethylene glycol (MIRALAX / GLYCOLAX) packet Take 17 g by mouth daily as needed (for constipation). Mix with liquid and drink    . zinc oxide (BALMEX) 11.3 % CREA cream Apply 1 application topically at bedtime as needed.      No current facility-administered medications for this visit.     PHYSICAL EXAMINATION: ECOG PERFORMANCE STATUS: 2 - Symptomatic, <50% confined to bed  Vitals:   03/24/16 1451  BP: (!) 169/91  Pulse: 74  Resp: 18  Temp: 99.2 F (37.3 C)   Filed Weights    GENERAL:alert, no distress and comfortable SKIN: skin color, texture, turgor are normal, no rashes or significant lesions EYES: normal, Conjunctiva are pink and non-injected, sclera clear OROPHARYNX:no exudate, no erythema and lips, buccal mucosa, and tongue normal  NECK: supple, thyroid normal size, non-tender, without nodularity LYMPH:  no palpable lymphadenopathy in the cervical, axillary or inguinal LUNGS: clear to auscultation and percussion with normal breathing effort HEART: regular rate & rhythm and no murmurs and no lower extremity edema ABDOMEN:abdomen soft, non-tender and normal bowel sounds MUSCULOSKELETAL:no cyanosis of digits and no clubbing  NEURO: Generalized weakness neurological  deficits. EXTREMITIES: No lower extremity edema BREAST:Palpable nodularity in the left breast inferior aspect which is same as before. This is at the site of the surgical excision.. (exam performed in the presence of a chaperone)  LABORATORY DATA:  I have reviewed the data as listed   Chemistry      Component Value Date/Time   NA 142 07/24/2015 1112   NA 143 05/30/2014 1212   K 3.3 (L) 07/24/2015 1112   K 3.5 05/30/2014 1212   CL 103 07/24/2015 1112   CO2 29 07/24/2015 1112   CO2 28 05/30/2014 1212   BUN 13 07/24/2015 1112   BUN 14.6 05/30/2014 1212   CREATININE 0.65 07/24/2015 1112   CREATININE 0.7 05/30/2014 1212      Component Value Date/Time   CALCIUM 9.9 07/24/2015 1112   CALCIUM 9.9 05/30/2014 1212   ALKPHOS 78 07/24/2015 1112   ALKPHOS 75 05/30/2014 1212   AST 22 07/24/2015 1112   AST 26 05/30/2014 1212   ALT 26 07/24/2015 1112   ALT 34 05/30/2014 1212   BILITOT 0.6 07/24/2015 1112   BILITOT 0.49 05/30/2014 1212  Lab Results  Component Value Date   WBC 7.6 07/24/2015   HGB 15.5 (H) 07/24/2015   HCT 47.0 (H) 07/24/2015   MCV 89.5 07/24/2015   PLT 240.0 07/24/2015   NEUTROABS 4.1 07/24/2015     ASSESSMENT & PLAN:  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 Oncotype DX recurrence score 16; 10% risk of recurrence, low risk did not need chemotherapy status post radiation therapy completed January 2016, started antiestrogen therapy with Arimidex 1 mg daily 09/07/2014  Switched to letrozole November 2016.  letrozole toxicities: Patient has chronic muscle stiffness and aches.  These symptoms are much better on letrozole then on anastrozole. Cellulitis underneath the leg: Resolved Myalgias and arthralgias :  tried tonic water Breast Cancer Surveillance: 1. Breast exam08/03/2016: Left breast nipple retraction and  tenderness along the surgical scar with nodularity along the scar but beyond that there are no lumps or nodules in the breasts. 2. Mammogram12/13/2016 No abnormalities. Postsurgical changes. Breast density category C. I recommended that she get 3-D mammograms for surveillance.  Return to clinic in May 2018 for follow-up on letrozole.   No orders of the defined types were placed in this encounter.  The patient has a good understanding of the overall plan. she agrees with it. she will call with any problems that may develop before the next visit here.   Rulon Eisenmenger, MD 03/24/16

## 2016-04-17 ENCOUNTER — Encounter: Payer: Self-pay | Admitting: Neurology

## 2016-04-17 ENCOUNTER — Ambulatory Visit (INDEPENDENT_AMBULATORY_CARE_PROVIDER_SITE_OTHER): Payer: PPO | Admitting: Neurology

## 2016-04-17 VITALS — BP 149/58 | HR 66 | Ht 62.0 in

## 2016-04-17 DIAGNOSIS — G8220 Paraplegia, unspecified: Secondary | ICD-10-CM | POA: Diagnosis not present

## 2016-04-17 NOTE — Patient Instructions (Signed)
I had a long discussion the patient and her husband regarding her seizures which appear to be controlled on Trileptal. Continue 300 mg twice daily and check basic metabolic panel and CBC today. Continue baclofen for spasticity in the dose of 20 mg 3 times daily. She may take an additional half tablet on bad days. I advised her to stagger baclofen with Robaxin to avoid drowsiness and daytime sleepiness. She will return for follow-up in a year or call earlier if necessary.

## 2016-04-17 NOTE — Progress Notes (Signed)
PATIENT: Stacy Diaz DOB: 09-24-1950  REASON FOR VISIT: routine follow up for stroke, partial seizures HISTORY FROM: patient  HISTORY OF PRESENT ILLNESS: Stacy Diaz is a 65 year old female with history of previous meningioma resection with subsequent spastic paraparesis who developed left arm weakness and facial droop on the morning of 08/25/2012. She had multiple episodes in the past similar to the episode above but this one had been persistent. MRI was not an option since she had history of no clips to 2 meningioma resection. Since her surgery she has had trouble with weakness from waist down. At her baseline she had trouble angulating for the past 4 years but could bear weight on her legs but needed help with transfers. Lately her legs have been nonweight bearing at all.  She was admitted to [facility] for workup and CT was negative for acute infarct. She was discharged to [facility] after 3 days. On the third day there her symptoms became worse and she was readmitted to the hospital. On the second day of her second hospitalization she had a seizure that was witnessed and included loss of consciousness with a blank stare and lasted approximately 45 minutes. She suffered another seizure that was shorter in length approximately 10 minutes on the following day. She has not had any known recurrent seizures since that time. She was discharged on Keppra 750 mg twice a day and continues with that dose without any side effects.  She was discharged on Plavix 75 mg daily and continues with that dose. Patient denies medication side effects, with no signs of bleeding. Patient is in outpatient therapy.   Update 07/11/13 (PS): She is seen for her first office followup visit following hospital admission on 05/31/39 and with sudden onset of left facial droop and left upper weakness and numbness. She started improving by the time EMS came and she was back to nearly baseline within 1 hour. CT scan of  the head showed no acute abnormality. MRI could not be done because she has a metal surgical plate following meningioma resection surgery 10 years ago. She had EEG which was unremarkable. She was previously on Keppra 750 twice daily the dose of which was increased to 1 g twice daily. She states she has finished outpatient physical and occupational therapy and is almost 90% back to her baseline. She has spastic paraplegia following meningioma surgery 10 years ago and her similar episode left face and arm weakness possibly right brain stroke in January 2014. She is tolerating increase to Keppra but does complain of mild tiredness which seemed to be improving but is not back to her baseline. She is also tolerating Plavix without bleeding, bruising or other side effects. She states her blood pressure is well controlled, slightly elevated at 139/76 today. She had EEG done on 01/05/13 which showed no evidence of seizure activity.   UPDATE 10/18/13 (LL): She returns to the office for follow up, she reports she is doing well, back to her baseline. Gets very tired by the end of the day and due to core muscle weakness she is often leaning to the right in her wheelchair by the late afternoon. She has a new aide at home who is starting to help her do crunches and resistance band exercises. She is tolerating Keppra well with no recurrent partial seizures. Continues on Plavix, patient denies medication side effects, with no signs of bleeding.   UPDATE 04/27/14 (LL): She returns for stroke followup, has been well except  recently had a spell where she felt strange, called for her husband and he found her slumped in her chair and "out of it." They do not think she lost consciousness, and episode lasted less than 10 minutes and she was back to normal. She states that when she is hungry, she sometimes starts to feel poorly, anxious, and shaky and wonders if her blood sugar is too low. The feeling disappears when she eats. They do not  have a blood glucose meter. She has been compliant with taking her Keppra and all other medications. Her blood pressure is well controlled, it is 134/80 in the office today. She is tolerating Plavix well with no signs of significant bleeding or bruising. Update 10/31/2014 : She returns for follow-up after last visit 6 months ago accompanied by husband. She had 2 episodes of transient left upper extremity numbness lasting 10 minutes 3 weeks apart in August 2015. She also had an episode of seizure in September 2015 when she must have violent 3 hit the joystick off her electric wheelchair. At that time Keppra was changed to Vimpat which she takes in a dose of 50 mg twice daily. She does complain of tired numbness but this may be related to her recent diagnosis of breast cancer for which she has undergone lumpectomy and has received 4 weeks of radiation. She is currently on antiestrogen pill. She states her blood pressure has been good. Update 05/14/2015 : She returns for follow-up after last visit 6 months ago. She is doing well without recurrent TIA symptoms or seizures. She is tolerating Vimpat 100 mg twice daily without any side effects. She remains on baclofen 20 mg 4 times daily which seems to work well for her spasticity and when it wears off she notices a difference. She has been started on Arimidex by her cancer doctor and feels that this may have increased stiffness. She plans to discuss this with her cancer doctor at the next visit. She remains on Plavix which is tolerating well without any bleeding episodes but does have minor bruising. She is also on Lipitor without muscle aches or pains. She is due for follow-up lipid profile in 2 months at next visit with primary doctor. She remains wheelchair bound since her meningioma surgery and can help with transfers and can  bear weight. but cannot walk Update 04/17/2016 :  She returns for follow-up after last visit a year ago. She is accompanied by husband. Patient  states she could not afford the Vimpat and hence was switched to Trileptal 300 mg twice daily. She complains of little bit of drowsiness still with that but it's getting better. She continues to have significant spasticity in stiffness of her legs. She has trouble bending her legs and putting it on the foot rest of her electric wheelchair. She currently takes baclofen 20 mg 4 times daily and can take To hospital if needed. She also takes Robaxin 503 times daily. She however tends to take all the pills together at the same time. I advised her to try to stagger the Robaxin and the baclofen that she is not as sleepy. Patient has some occasional bad days and she feels very weak and tired and can barely get up or concentrate. REVIEW OF SYSTEMS: Full 14 system review of systems performed and notable only for , leg pain, stiffness, weakness only and all other systems negative. Recent diagnosis of breast cancer status post lumpectomy and radiation.  ALLERGIES: No Known Allergies  HOME MEDICATIONS: Outpatient Medications Prior  to Visit  Medication Sig Dispense Refill  . atenolol (TENORMIN) 50 MG tablet 1 tablet in the morning....... half a tab at bedtime 150 tablet 3  . atorvastatin (LIPITOR) 10 MG tablet Take 1 tablet (10 mg total) by mouth daily. 90 tablet 3  . baclofen (LIORESAL) 20 MG tablet TAKE 1 TABLET (20 MG TOTAL) BY MOUTH 4 (FOUR) TIMES DAILY. 360 tablet 2  . clopidogrel (PLAVIX) 75 MG tablet TAKE 1 TABLET BY MOUTH DAILY WITH BREAKFAST 90 tablet 2  . diazepam (VALIUM) 5 MG tablet TAKE 1 TABLET BY MOUTH AT BEDTIME 90 tablet 1  . docusate sodium (COLACE) 100 MG capsule Take 100 mg by mouth 2 (two) times daily.    Marland Kitchen letrozole (FEMARA) 2.5 MG tablet Take 1 tablet (2.5 mg total) by mouth daily. 90 tablet 3  . LORazepam (ATIVAN) 1 MG tablet Take 1 tablet (1 mg total) by mouth 2 (two) times daily as needed. for anxiety 180 tablet 0  . Melatonin 3 MG TABS Take 1 tablet (3 mg total) by mouth at bedtime. 90  tablet 3  . methocarbamol (ROBAXIN) 500 MG tablet Take 1 tablet (500 mg total) by mouth 3 (three) times daily. 300 tablet 3  . Multiple Vitamin (MULTIVITAMIN WITH MINERALS) TABS tablet Take 1 tablet by mouth daily.    Marland Kitchen OVER THE COUNTER MEDICATION Apply 1 application topically daily as needed (pain). Aspercreme with 5% lidocaine    . Oxcarbazepine (TRILEPTAL) 300 MG tablet Take 0.5 tablets (150 mg total) by mouth 2 (two) times daily. Start with 1/2 tablet twice daily x 2 weeks then one tablet twice daily 180 tablet 3  . oxyCODONE-acetaminophen (PERCOCET/ROXICET) 5-325 MG tablet Take 2 tablets by mouth 3 (three) times daily. 540 tablet 0  . polyethylene glycol (MIRALAX / GLYCOLAX) packet Take 17 g by mouth daily as needed (for constipation). Mix with liquid and drink    . zinc oxide (BALMEX) 11.3 % CREA cream Apply 1 application topically at bedtime as needed.     . lidocaine (LIDODERM) 5 % Place 1 patch onto the skin daily as needed (back pain). Remove & Discard patch within 12 hours or as directed by MD    . methocarbamol (ROBAXIN) 500 MG tablet Take 1 tablet (500 mg total) by mouth 3 (three) times daily. (Patient not taking: Reported on 04/17/2016) 300 tablet 3   No facility-administered medications prior to visit.     PHYSICAL EXAM Vitals:   04/17/16 1118  BP: (!) 149/58  BP Location: Right Arm  Patient Position: Sitting  Cuff Size: Normal  Pulse: 66  Height: 5\' 2"  (1.575 m)    GENERAL EXAM: Pleasant middle-age Caucasian lady Patient is in no distress, pleasant, wheelchair bound  HEAD: Symmetric facial features.  EARS, NOSE, and THROAT: Normal.  NECK: Supple, no JVD  RESPIRATORY: Lungs CTA.  CARDIOVASCULAR: Regular rate and rhythm, no murmurs, no carotid bruits  SKIN: No rash, no bruising   NEUROLOGIC:  MENTAL STATUS: awake, alert and oriented to person, place and time, language fluent, comprehension intact, naming intact  CRANIAL NERVE: pupils equal and reactive to light, visual  fields full to confrontation, mild facial asymmetry on the left, uvula midline, shoulder shrug symmetric, tongue midline.  MOTOR: Significant weakness in right and left lower extremities with only 1/5 strength hip extension and 0/5 at the hips ankles and toes. Tone is increase in the both lower extremities, with significant spasticity. 4/5 strength left upper extremity, fine finger movements decreased on left  SENSORY: normal and symmetric to light touch . No sensory level on the trunk COORDINATION: finger-nose-finger with mild ataxia on left, normal on right  REFLEXES: deep tendon reflexes BUE present and symmetric 2+, BLE brisk 2= but difficult to elicit  GAIT/STATION: Wheelchair-bound.   ASSESSMENT: Ms. Elinda Mo is a 65 year old female with history of previous meningioma resection with subsequent spastic paraparesis  In 1998 who developed left arm weakness and facial droop on the morning of 08/25/2012. Felt to be stroke although never confirmed on CT. She experienced 2 partial complex seizures while in the hospital and was discharged on Keppra 750 mg twice a day. She has had a recurrent seizure in September 2015 and was changed from [place] to [place] but could not afford it hence switched to trileptal and seems to be doing well on it PLAN:  I had a long discussion the patient and her husband regarding her seizures which appear to be controlled on Trileptal. Continue 300 mg twice daily and check basic metabolic panel and CBC today. Continue baclofen for spasticity in the dose of 20 mg 3 times daily. She may take an additional half tablet on bad days. I advised her to stagger baclofen with Robaxin to avoid drowsiness and daytime sleepiness. She will return for follow-up in a year or call earlier if necessary.   Antony Contras, MD  04/17/2016, 1:50 PM [facility] [address], [place], [place] [address] [phone]  Note: This document was prepared with digital  dictation and possible smart phrase technology. Any transcriptional errors that result from this process are unintentional.

## 2016-04-18 LAB — BASIC METABOLIC PANEL
BUN/Creatinine Ratio: 23 (ref 12–28)
BUN: 13 mg/dL (ref 8–27)
CALCIUM: 9.8 mg/dL (ref 8.7–10.3)
CHLORIDE: 93 mmol/L — AB (ref 96–106)
CO2: 20 mmol/L (ref 18–29)
Creatinine, Ser: 0.57 mg/dL (ref 0.57–1.00)
GFR calc Af Amer: 113 mL/min/{1.73_m2} (ref >59)
GFR calc non Af Amer: 98 mL/min/{1.73_m2} (ref >59)
GLUCOSE: 97 mg/dL (ref 65–99)
POTASSIUM: 4.8 mmol/L (ref 3.5–5.2)
Sodium: 137 mmol/L (ref 134–144)

## 2016-04-18 LAB — CBC
HEMOGLOBIN: 14.5 g/dL (ref 11.1–15.9)
Hematocrit: 42.5 % (ref 34.0–46.6)
MCH: 30.3 pg (ref 26.6–33.0)
MCHC: 34.1 g/dL (ref 31.5–35.7)
MCV: 89 fL (ref 79–97)
Platelets: 231 10*3/uL (ref 150–379)
RBC: 4.79 x10E6/uL (ref 3.77–5.28)
RDW: 13 % (ref 12.3–15.4)
WBC: 9.7 10*3/uL (ref 3.4–10.8)

## 2016-04-21 ENCOUNTER — Encounter: Payer: Self-pay | Admitting: Family Medicine

## 2016-04-21 ENCOUNTER — Ambulatory Visit (INDEPENDENT_AMBULATORY_CARE_PROVIDER_SITE_OTHER): Payer: PPO | Admitting: Family Medicine

## 2016-04-21 VITALS — BP 162/80 | HR 83 | Temp 97.9°F

## 2016-04-21 DIAGNOSIS — Z0000 Encounter for general adult medical examination without abnormal findings: Secondary | ICD-10-CM | POA: Diagnosis not present

## 2016-04-21 DIAGNOSIS — Z23 Encounter for immunization: Secondary | ICD-10-CM

## 2016-04-21 DIAGNOSIS — E785 Hyperlipidemia, unspecified: Secondary | ICD-10-CM | POA: Diagnosis not present

## 2016-04-21 DIAGNOSIS — C50512 Malignant neoplasm of lower-outer quadrant of left female breast: Secondary | ICD-10-CM

## 2016-04-21 DIAGNOSIS — Z8673 Personal history of transient ischemic attack (TIA), and cerebral infarction without residual deficits: Secondary | ICD-10-CM | POA: Diagnosis not present

## 2016-04-21 MED ORDER — CLOPIDOGREL BISULFATE 75 MG PO TABS: 75.0000 mg | ORAL_TABLET | Freq: Every day | ORAL | 3 refills | Status: DC

## 2016-04-21 MED ORDER — BACLOFEN 20 MG PO TABS: ORAL_TABLET | ORAL | 2 refills | Status: DC

## 2016-04-21 MED ORDER — OXYCODONE-ACETAMINOPHEN 5-325 MG PO TABS: 2.0000 | ORAL_TABLET | Freq: Three times a day (TID) | ORAL | 0 refills | Status: DC

## 2016-04-21 MED ORDER — ATENOLOL 50 MG PO TABS: ORAL_TABLET | ORAL | 3 refills | Status: DC

## 2016-04-21 MED ORDER — ATORVASTATIN CALCIUM 10 MG PO TABS: 10.0000 mg | ORAL_TABLET | Freq: Every day | ORAL | 3 refills | Status: DC

## 2016-04-21 NOTE — Patient Instructions (Signed)
Continue current medications  Purchase a Omron pump up digital blood pressure cuff......... check your blood pressure right arm sitting position in the morning for 2 weeks to be sure your blood pressure is normal.........Marland Kitchen 140/90 or less........ if not call me  Consider medical marijuana to help relieve your symptoms

## 2016-04-21 NOTE — Progress Notes (Addendum)
Is a 65 year old married female nonsmoker who comes in today... accompanied by her husband who is her primary caregiver... 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 She is wheelchair-bound with the inability to use her legs at all. She's also seen and followed by neurology to try to help with the stiffness. She's tried to get her weight off this year she's down 40 pounds. Despite that she still has the severe stiffness. They've tried to readjust her medication but it doesn't seem to be helping much. I discussed with her other options including medical marijuana. I think she would be an excellent candidate for that.  She takes Tenormin 50 mg in the morning and half a tab at bedtime for hypertension BP at home 130/70. She takes Lipitor 10 mg for hyperlipidemia baclofen 20 mg 4 times a day and Plavix as a blood thinner. She also takes Percocet 5-3 25 2  tablets 3 times daily for chronic pain and MiraLAX for the cup dictation of constipation.  She gets routine eye care, dental care, mammogram 2016, never had a colonoscopy think this would be very difficult in her situation.  She sees oncology on a regular basis because a history of breast cancer. Currently asymptomatic cancer in remission  Vaccinations will give her a Pneumovax and a flu shot today. Advised to call insurance company to get a shingles vaccine.  Review of systems otherwise negative  Physical examination.......Marland Kitchen vital signs stable she's afebrile HEENT were negative neck was supple no adenopathy thyroid not enlarged chest is clear to auscultation cardiac exam normal. Chest exam normal except for tattoos from previous radiation treatment for breast cancer  Impression #1 hypertension at goal........ 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 continue current medications consider trial of medical marijuana  #3. History of breast cancer........ continue followed by oncology. Her braces that she's had for many years are now worn out and need to be replaced.

## 2016-04-21 NOTE — Progress Notes (Signed)
Pre visit review using our clinic review tool, if applicable. No additional management support is needed unless otherwise documented below in the visit note. 

## 2016-04-22 ENCOUNTER — Telehealth: Payer: Self-pay

## 2016-04-22 LAB — TSH: TSH: 0.72 u[IU]/mL (ref 0.35–4.50)

## 2016-04-22 LAB — HEPATIC FUNCTION PANEL
ALBUMIN: 4.6 g/dL (ref 3.5–5.2)
ALK PHOS: 92 U/L (ref 39–117)
ALT: 22 U/L (ref 0–35)
AST: 22 U/L (ref 0–37)
Bilirubin, Direct: 0.1 mg/dL (ref 0.0–0.3)
TOTAL PROTEIN: 7 g/dL (ref 6.0–8.3)
Total Bilirubin: 0.4 mg/dL (ref 0.2–1.2)

## 2016-04-22 LAB — LIPID PANEL
CHOL/HDL RATIO: 4
Cholesterol: 197 mg/dL (ref 0–200)
HDL: 56.2 mg/dL (ref >39.00)
LDL CALC: 109 mg/dL — AB (ref 0–99)
NonHDL: 141.1
TRIGLYCERIDES: 159 mg/dL — AB (ref 0.0–149.0)
VLDL: 31.8 mg/dL (ref 0.0–40.0)

## 2016-04-22 NOTE — Telephone Encounter (Signed)
Rn spoke with patients about her labs being normal. Pts husband stated they met with the PCP about cannabis oil for a regimen. The husband stated its a OTC medication and the PCP would like for Dr. Leonie Man to review the medication. PTs husband stated they wanted to know if it can affect the seizure and other medications. Rn stated a message will be sent to Dr. Leonie Man.

## 2016-04-27 NOTE — Telephone Encounter (Signed)
Spoke to patient`s  husband and told him that I do not have specific concerns about cannabis oil to leading increase in her seizure frequency  If used in therepeautic dosage or making medications less effective. In fact in certain patients with refractory seizures body  Cannabis has  been known to help with seizure control.

## 2016-04-30 ENCOUNTER — Ambulatory Visit (INDEPENDENT_AMBULATORY_CARE_PROVIDER_SITE_OTHER): Payer: PPO | Admitting: Adult Health

## 2016-04-30 DIAGNOSIS — N3001 Acute cystitis with hematuria: Secondary | ICD-10-CM

## 2016-04-30 LAB — POC URINALSYSI DIPSTICK (AUTOMATED)
Bilirubin, UA: NEGATIVE
Glucose, UA: NEGATIVE
Ketones, UA: NEGATIVE
Nitrite, UA: NEGATIVE
Protein, UA: NEGATIVE
Spec Grav, UA: 1.01
Urobilinogen, UA: 0.2
pH, UA: 7

## 2016-04-30 MED ORDER — FLUCONAZOLE 150 MG PO TABS: 150.0000 mg | ORAL_TABLET | Freq: Every day | ORAL | 0 refills | Status: DC

## 2016-04-30 MED ORDER — CIPROFLOXACIN HCL 500 MG PO TABS: 500.0000 mg | ORAL_TABLET | Freq: Two times a day (BID) | ORAL | 0 refills | Status: DC

## 2016-04-30 NOTE — Patient Instructions (Addendum)

## 2016-04-30 NOTE — Progress Notes (Signed)
[id number] [name], MD No chief complaint on file.   Current Issues:  Presents with greater than 10  days of dysuria and urinary frequency Associated symptoms include:  cloudy urine, hematuria and urinary frequency  There is no history of of similar symptoms. Sexually active:  No   No concern for STI.  Prior to Admission medications   Medication Sig Start Date End Date Taking? Authorizing Provider  atenolol (TENORMIN) 50 MG tablet 1 tablet in the morning....... half a tab at bedtime 04/21/16   Stacy Cookey, MD  atorvastatin (LIPITOR) 10 MG tablet Take 1 tablet (10 mg total) by mouth daily. 04/21/16   Stacy Cookey, MD  baclofen (LIORESAL) 20 MG tablet TAKE 1 TABLET (20 MG TOTAL) BY MOUTH 4 (FOUR) TIMES DAILY. 04/21/16   Stacy Cookey, MD  clopidogrel (PLAVIX) 75 MG tablet Take 1 tablet (75 mg total) by mouth daily with breakfast. 04/21/16   Stacy Cookey, MD  diazepam (VALIUM) 5 MG tablet TAKE 1 TABLET BY MOUTH AT BEDTIME 02/21/16   Stacy Cookey, MD  docusate sodium (COLACE) 100 MG capsule Take 100 mg by mouth 2 (two) times daily.    Historical Provider, MD  letrozole (FEMARA) 2.5 MG tablet Take 1 tablet (2.5 mg total) by mouth daily. 06/25/15   Nicholas Lose, MD  LORazepam (ATIVAN) 1 MG tablet Take 1 tablet (1 mg total) by mouth 2 (two) times daily as needed. for anxiety 03/11/16   Stacy Cookey, MD  Melatonin 3 MG TABS Take 1 tablet (3 mg total) by mouth at bedtime. 12/07/12   Stacy Cookey, MD  methocarbamol (ROBAXIN) 500 MG tablet Take 1 tablet (500 mg total) by mouth 3 (three) times daily. 10/14/15   Stacy Cookey, MD  Multiple Vitamin (MULTIVITAMIN WITH MINERALS) TABS tablet Take 1 tablet by mouth daily.    Historical Provider, MD  OVER THE COUNTER MEDICATION Apply 1 application topically daily as needed (pain). Aspercreme with 5% lidocaine    Historical Provider, MD  Oxcarbazepine (TRILEPTAL) 300 MG tablet Take 0.5 tablets (150 mg total) by mouth 2 (two) times daily. Start with 1/2  tablet twice daily x 2 weeks then one tablet twice daily 01/22/16   Stacy Fila, MD  oxyCODONE-acetaminophen (PERCOCET/ROXICET) 5-325 MG tablet Take 2 tablets by mouth 3 (three) times daily. 04/21/16   Stacy Cookey, MD  polyethylene glycol [facility] / Stacy Diaz) packet Take 17 g by mouth daily as needed (for constipation). Mix with liquid and drink    Historical Provider, MD  zinc oxide (BALMEX) 11.3 % CREA cream Apply 1 application topically at bedtime as needed.     Historical Provider, MD    Review of Systems: Negative unless mentioned above   PE:  There were no vitals taken for this visit. Constitutional: Alert and oriented x 4. No signsof acute distress Heart: Normal rate and normal rhythm. No M/R/G Lungs: Clear to auscultation   Results for orders placed or performed in visit on 04/30/16  POCT Urinalysis Dipstick (Automated)  Result Value Ref Range   Color, UA yellow    Clarity, UA cloudy    Glucose, UA n    Bilirubin, UA n    Ketones, UA n    Spec Grav, UA 1.010    Blood, UA 2+    pH, UA 7.0    Protein, UA n    Urobilinogen, UA 0.2    Nitrite, UA n    Leukocytes, UA large (3+) (A) Negative  Assessment and Plan:  1. Acute cystitis with hematuria - POCT Urinalysis Dipstick (Automated) - Urine culture - ciprofloxacin (CIPRO) 500 MG tablet; Take 1 tablet (500 mg total) by mouth 2 (two) times daily.  Dispense: 10 tablet; Refill: 0 - fluconazole (DIFLUCAN) 150 MG tablet; Take 1 tablet (150 mg total) by mouth daily.  Dispense: 2 tablet; Refill: 0 - Follow up as needed  Stacy Peng, NP

## 2016-05-02 LAB — URINE CULTURE: Colony Count: >=100000

## 2016-05-13 ENCOUNTER — Ambulatory Visit: Payer: PPO | Admitting: Neurology

## 2016-06-01 ENCOUNTER — Encounter: Payer: Self-pay | Admitting: Family Medicine

## 2016-06-01 ENCOUNTER — Emergency Department (HOSPITAL_COMMUNITY)
Admission: EM | Admit: 2016-06-01 | Discharge: 2016-06-01 | Disposition: A | Discharge status: Home / Self Care | Payer: PPO | Attending: Emergency Medicine | Admitting: Emergency Medicine

## 2016-06-01 ENCOUNTER — Emergency Department (HOSPITAL_COMMUNITY): Payer: PPO

## 2016-06-01 DIAGNOSIS — Y939 Activity, unspecified: Secondary | ICD-10-CM | POA: Diagnosis not present

## 2016-06-01 DIAGNOSIS — Z87891 Personal history of nicotine dependence: Secondary | ICD-10-CM | POA: Diagnosis not present

## 2016-06-01 DIAGNOSIS — W19XXXA Unspecified fall, initial encounter: Secondary | ICD-10-CM

## 2016-06-01 DIAGNOSIS — Y999 Unspecified external cause status: Secondary | ICD-10-CM | POA: Diagnosis not present

## 2016-06-01 DIAGNOSIS — W07XXXA Fall from chair, initial encounter: Secondary | ICD-10-CM | POA: Diagnosis not present

## 2016-06-01 DIAGNOSIS — Y92009 Unspecified place in unspecified non-institutional (private) residence as the place of occurrence of the external cause: Secondary | ICD-10-CM | POA: Insufficient documentation

## 2016-06-01 DIAGNOSIS — I10 Essential (primary) hypertension: Secondary | ICD-10-CM | POA: Diagnosis not present

## 2016-06-01 DIAGNOSIS — S2241XA Multiple fractures of ribs, right side, initial encounter for closed fracture: Secondary | ICD-10-CM | POA: Diagnosis not present

## 2016-06-01 DIAGNOSIS — Z853 Personal history of malignant neoplasm of breast: Secondary | ICD-10-CM | POA: Insufficient documentation

## 2016-06-01 DIAGNOSIS — Z79899 Other long term (current) drug therapy: Secondary | ICD-10-CM | POA: Insufficient documentation

## 2016-06-01 DIAGNOSIS — S299XXA Unspecified injury of thorax, initial encounter: Secondary | ICD-10-CM | POA: Diagnosis not present

## 2016-06-01 DIAGNOSIS — R52 Pain, unspecified: Secondary | ICD-10-CM | POA: Diagnosis not present

## 2016-06-01 DIAGNOSIS — R0781 Pleurodynia: Secondary | ICD-10-CM | POA: Diagnosis not present

## 2016-06-01 NOTE — Discharge Instructions (Signed)
Please read attached information. If you experience any new or worsening signs or symptoms please return to the emergency room for evaluation. Please follow-up with your primary care provider or specialist as discussed. Please use medication prescribed only as directed and discontinue taking if you have any concerning signs or symptoms.   °

## 2016-06-01 NOTE — ED Notes (Signed)
Bed: WA06 Expected date:  Expected time:  Means of arrival:  Comments: EMS - parapalegic, fall

## 2016-06-01 NOTE — ED Notes (Signed)
Family at bedside. 

## 2016-06-01 NOTE — ED Notes (Signed)
Social Work at [employer]

## 2016-06-01 NOTE — Progress Notes (Addendum)
   06/01/16 0000  CM Assessment  Expected Discharge [place]  In-house Referral Clinical Social Work  Discharge Planning Services CM Consult  [facility] Choice Home Health  Choice offered to / list presented to  Patient;Spouse  DME Arranged N/A  DME Agency NA  [place] Arranged RN;PT;Nurse's Aide  [place]  Status of Service Completed, signed off  Discharge Disposition Home w [place]  CM reviewed in details about Choices of home health [facility]) (length of stay in home, types of [facility] staff available, coverage, primary caregiver, up to 24 hrs before services may be started) and choices of Private duty nursing (PDN-coverage, length of stay in the home types of staff available). CM reviewed availability of [place] SW to assist pcp to get pt to snf (if desired disposition) from the community level. CM provided pt/family with a list of [place] community Health Response program and choice connections resources.  Pt already has a person coming into the home they are paying out of pocket and denied need of PDN resources Discussed pt with Merry Proud PA Choice of Home health agency is [facility] - States pt has services after d/c from snf with [facility]

## 2016-06-01 NOTE — ED Provider Notes (Signed)
[facility] DEPT Provider Note   CSN: [id number] Arrival date & time: 06/01/16  1033    History   Chief Complaint Chief Complaint  Patient presents with  . Fall    HPI Stacy Diaz is a 65 y.o. female.  HPI  65 year old female with history of paresis of lower extremity presents status post fall. Patient reports that 3 days ago she was sitting in her chair in the shower when she had a mechanical fall landing on her right side. Patient reports that time she had right-sided rib pain elbow pain and shoulder pain. Patient notes that the abdominal pain has improved, but continues to endorse right lateral rib pain and sternal pain. Patient denies any acute shortness of breath, reports pain is worse with deep inspiration. Patient reports that she was having right-sided hip pain with palpation, no pain with weightbearing.   Past Medical History:  Diagnosis Date  . Anxiety   . Arthritis    "probably" (08/25/2012)  . Breast cancer of lower-outer quadrant of left female breast ([place]) 05/25/2014  . Headache(784.0)    "not real frequent" (08/25/2012)  . Heart disease   . HTN (hypertension)   . Hyperlipidemia   . Meningioma ([place]) 1999  . Myocardial infarction 1987  . 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 S/P radiation therapy 07/30/14-08/30/13   left breast cancer/50Gy  . Seizures ([place])    last seizure 1 month ago  . 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 Stroke [facility]) 08/25/2012   "that's what they think I've had; sudden LUE weakness" (08/25/2012)    Patient Active Problem List   Diagnosis Date Noted  . Bacterial folliculitis [id number]  . Family history of breast cancer 06/07/2014  . Breast cancer of lower-outer quadrant of left female breast ([place]) 05/25/2014  . Partial symptomatic epilepsy with complex partial seizures, not intractable, without status epilepticus ([place]) 04/29/2014  . History of stroke 04/29/2014  . CVA  (cerebral infarction) 09/05/2012  . Facial droop 09/02/2012  . UTI (lower urinary tract infection) 08/26/2012  . 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 Boil of buttock 09/28/2011  . BENIGN NEOPLASM OF BRAIN 03/31/2010  . INJURY, NERVE, PELVIS/LWR LIMB NOS 05/20/2007  . Hyperlipidemia 05/12/2007  . Essential hypertension 05/12/2007  . PREMATURE VENTRICULAR CONTRACTIONS 05/12/2007    Past Surgical History:  Procedure Laterality Date  . BRAIN MENINGIOMA EXCISION  1999  . BREAST LUMPECTOMY WITH NEEDLE LOCALIZATION AND AXILLARY SENTINEL LYMPH NODE BX Left 06/22/2014   Procedure: LEFT WIRE LOCALIZED LUMPECTOPMY AND SENTINEL NODE MAPPING;  Surgeon: Autumn Messing III, MD;  Location: [place];  Service: General;  Laterality: Left;  . CARDIAC CATHETERIZATION  1987  . CORONARY ANGIOPLASTY  1987   denied intervention: "blockage wasn't bad enough"    OB History    No data available       Home Medications    Prior to Admission medications   Medication Sig Start Date End Date Taking? Authorizing Provider  atenolol (TENORMIN) 50 MG tablet 1 tablet in the morning....... half a tab at bedtime Patient taking differently: Take 25-50 mg by mouth 2 (two) times daily. 1 tablet in the morning....... half a tab at bedtime 04/21/16  Yes Dorena Cookey, MD  atorvastatin (LIPITOR) 10 MG tablet Take 1 tablet (10 mg total) by mouth daily. Patient taking differently: Take 10 mg by mouth every morning.  04/21/16  Yes Dorena Cookey, MD  baclofen (  LIORESAL) 20 MG tablet TAKE 1 TABLET (20 MG TOTAL) BY MOUTH 4 (FOUR) TIMES DAILY. 04/21/16  Yes Dorena Cookey, MD  clopidogrel (PLAVIX) 75 MG tablet Take 1 tablet (75 mg total) by mouth daily with breakfast. 04/21/16  Yes Dorena Cookey, MD  diazepam (VALIUM) 5 MG tablet TAKE 1 TABLET BY MOUTH AT BEDTIME Patient taking differently: Take 5 mg by mouth at bedtime 02/21/16  Yes Dorena Cookey, MD  docusate sodium (COLACE) 100 MG capsule Take 100 mg by mouth 2 (two) times daily.    Yes Historical Provider, MD  letrozole (FEMARA) 2.5 MG tablet Take 1 tablet (2.5 mg total) by mouth daily. Patient taking differently: Take 2.5 mg by mouth at bedtime.  06/25/15  Yes Nicholas Lose, MD  LORazepam (ATIVAN) 1 MG tablet Take 1 tablet (1 mg total) by mouth 2 (two) times daily as needed. for anxiety Patient taking differently: Take 1 mg by mouth 2 (two) times daily. for anxiety 03/11/16  Yes Dorena Cookey, MD  methocarbamol (ROBAXIN) 500 MG tablet Take 1 tablet (500 mg total) by mouth 3 (three) times daily. 10/14/15  Yes Dorena Cookey, MD  Multiple Vitamin (MULTIVITAMIN WITH MINERALS) TABS tablet Take 1 tablet by mouth every morning.    Yes Historical Provider, MD  OVER THE COUNTER MEDICATION Apply 1 application topically daily as needed (pain). Aspercreme with 5% lidocaine   Yes Historical Provider, MD  Oxcarbazepine (TRILEPTAL) 300 MG tablet Take 0.5 tablets (150 mg total) by mouth 2 (two) times daily. Start with 1/2 tablet twice daily x 2 weeks then one tablet twice daily Patient taking differently: Take 300 mg by mouth 2 (two) times daily.  01/22/16  Yes Garvin Fila, MD  oxyCODONE-acetaminophen (PERCOCET/ROXICET) 5-325 MG tablet Take 2 tablets by mouth 3 (three) times daily. 04/21/16  Yes Dorena Cookey, MD  polyethylene glycol [facility] / GLYCOLAX) packet Take 17 g by mouth daily as needed (for constipation). Mix with liquid and drink   Yes Historical Provider, MD  zinc oxide (BALMEX) 11.3 % CREA cream Apply 1 application topically at bedtime as needed (IRRITATION).    Yes Historical Provider, MD  ciprofloxacin (CIPRO) 500 MG tablet Take 1 tablet (500 mg total) by mouth 2 (two) times daily. Patient not taking: Reported on 06/01/2016 04/30/16   [name], NP  fluconazole (DIFLUCAN) 150 MG tablet Take 1 tablet (150 mg total) by mouth daily. Patient not taking: Reported on 06/01/2016 04/30/16   [name], NP  Melatonin 3 MG TABS Take 1 tablet (3 mg total) by mouth at bedtime. Patient  not taking: Reported on 06/01/2016 12/07/12   Dorena Cookey, MD    Family History Family History  Problem Relation Age of Onset  . Breast cancer Mother 31    unilateral  . Heart disease Father   . Breast cancer Paternal Grandmother 62    unilateral    Social History Social History  Substance Use Topics  . Smoking status: Former Smoker    Packs/day: 2.00    Years: 10.00    Types: Cigarettes  . Smokeless tobacco: Never Used     Comment: 08/25/2012 "quit smoking cigarettes in 1985 when I had my heart attack"  . Alcohol use 4.2 oz/week    7 Glasses of wine per week     Comment: occasionally     Allergies   Review of patient's allergies indicates no known allergies.   Review of Systems Review of Systems  All other systems  reviewed and are negative.    Physical Exam Updated Vital Signs BP 146/72   Pulse (!) 57   Temp 98.6 F (37 C)   Resp 18   SpO2 99%   Physical Exam  Constitutional: She is oriented to person, place, and time. She appears well-developed and well-nourished.  HENT:  Head: Normocephalic and atraumatic.  Eyes: Conjunctivae are normal. Pupils are equal, round, and reactive to light. Right eye exhibits no discharge. Left eye exhibits no discharge. No scleral icterus.  Neck: Normal range of motion. No JVD present. No tracheal deviation present.  Pulmonary/Chest: Effort normal. No stridor.  Musculoskeletal:  No obvious signs of trauma, tenderness to palpation in the right lateral ribs, no crepitus, lung expansion normal, lung sounds clear. There is palpation of lower sternum. Bilateral upper extremity strength 5 out of 5 sensation intact for active range of motion. Lower extremities nontender atraumatic.  Neurological: She is alert and oriented to person, place, and time. Coordination normal.  Psychiatric: She has a normal mood and affect. Her behavior is normal. Judgment and thought content normal.  Nursing note and vitals reviewed.    ED Treatments /  Results  Labs (all labs ordered are listed, but only abnormal results are displayed) Labs Reviewed - No data to display  EKG  EKG Interpretation None      Radiology Dg Ribs Unilateral W/chest Right  Result Date: 06/01/2016 CLINICAL DATA:  Fall on Friday, right rib pain EXAM: RIGHT RIBS AND CHEST - 3+ VIEW COMPARISON:  None. FINDINGS: Five views right ribs submitted. There is nondisplaced fracture of the eighth rib. Minimal displaced fracture of the right fifth rib. No pneumothorax. IMPRESSION: There is nondisplaced fracture of the eighth rib. Minimal displaced fracture of the right fifth rib. No pneumothorax. Electronically Signed   By: Lahoma Crocker M.D.   On: 06/01/2016 12:24   Dg Sternum  Result Date: 06/01/2016 CLINICAL DATA:  Fall on Friday, right rib pain EXAM: STERNUM - 2+ VIEW COMPARISON:  None. FINDINGS: There is no evidence of fracture or other focal bone lesions. IMPRESSION: Negative. Electronically Signed   By: Lahoma Crocker M.D.   On: 06/01/2016 12:25    Procedures Procedures (including critical care time)  Medications Ordered in ED Medications - No data to display   Initial Impression / Assessment and Plan / ED Course  I have reviewed the triage vital signs and the nursing notes.  Pertinent labs & imaging results that were available during my care of the patient were reviewed by me and considered in my medical decision making (see chart for details).  Clinical Course     Final Clinical Impressions(s) / ED Diagnoses   Final diagnoses:  Fall, initial encounter  Closed fracture of multiple ribs of right side, initial encounter    Labs:  Imaging:  DG ribs unilateral chest, DG sternum   Consults:  Therapeutics:  Discharge Meds:   Assessment/Plan: 65 year old female status post fall. She has right-sided rib fracture nondisplaced. Clear lung sounds, no respiratory concerns. Patient has pain medication at home for chronic pain. She has no other signs of injury  here. Patient given incentive spirometer and discharged home with close follow-up with primary care. She is given strict return precautions, verbalized understanding and agreement today's plan.      New Prescriptions Discharge Medication List as of 06/01/2016  1:55 PM       Okey Regal, PA-C 06/01/16 2132    Daleen Bo, MD 06/02/16 [phone]

## 2016-06-01 NOTE — Progress Notes (Signed)
CSW spoke with patient at bedside along with husband [name] present. Patient stated her information could be discussed in front of the husband. Patient reports she was in her shower chair, leaned forward and tipped over and landed on her right side. Patient reports she has a caregiver that comes three times weekly that assist with bathing and dressing. Patient reports she has a wheelchair, hospital bed, and hoyer lift. Patient reports she has good family support.   Patients husband stated patient had home health with [facility] three years ago after patient having a stroke in 2014. He stated patient was at [facility] for ninety days. He stated patient has a neurologist. He asked about home health services and CSW stated the ED CM could speak with them regarding home health.    Genice Rouge [id number] ED CSW 06/01/2016 3:35 PM

## 2016-06-01 NOTE — Progress Notes (Signed)
Called home health referral in to [facility]

## 2016-06-01 NOTE — ED Triage Notes (Signed)
Patient is a parapalegic, was getting a bath on Friday, fell and hit the ground, c/o of right sided pain and rib pain

## 2016-06-02 ENCOUNTER — Emergency Department (HOSPITAL_COMMUNITY): Payer: PPO

## 2016-06-02 ENCOUNTER — Observation Stay (HOSPITAL_COMMUNITY)
Admission: EM | Admit: 2016-06-02 | Discharge: 2016-06-05 | Disposition: A | Discharge status: Home / Self Care | Payer: PPO | Attending: Family Medicine | Admitting: Family Medicine

## 2016-06-02 ENCOUNTER — Other Ambulatory Visit: Payer: Self-pay | Admitting: Emergency Medicine

## 2016-06-02 ENCOUNTER — Encounter (HOSPITAL_COMMUNITY): Payer: Self-pay | Admitting: Radiology

## 2016-06-02 DIAGNOSIS — Y998 Other external cause status: Secondary | ICD-10-CM | POA: Diagnosis not present

## 2016-06-02 DIAGNOSIS — M6281 Muscle weakness (generalized): Secondary | ICD-10-CM | POA: Diagnosis not present

## 2016-06-02 DIAGNOSIS — I252 Old myocardial infarction: Secondary | ICD-10-CM | POA: Insufficient documentation

## 2016-06-02 DIAGNOSIS — Y9389 Activity, other specified: Secondary | ICD-10-CM | POA: Diagnosis not present

## 2016-06-02 DIAGNOSIS — Z7902 Long term (current) use of antithrombotics/antiplatelets: Secondary | ICD-10-CM | POA: Diagnosis not present

## 2016-06-02 DIAGNOSIS — G40209 Localization-related (focal) (partial) symptomatic epilepsy and epileptic syndromes with complex partial seizures, not intractable, without status epilepticus: Secondary | ICD-10-CM | POA: Diagnosis present

## 2016-06-02 DIAGNOSIS — Z87891 Personal history of nicotine dependence: Secondary | ICD-10-CM | POA: Insufficient documentation

## 2016-06-02 DIAGNOSIS — W050XXA Fall from non-moving wheelchair, initial encounter: Secondary | ICD-10-CM | POA: Insufficient documentation

## 2016-06-02 DIAGNOSIS — R29818 Other symptoms and signs involving the nervous system: Secondary | ICD-10-CM | POA: Diagnosis not present

## 2016-06-02 DIAGNOSIS — Z6835 Body mass index (BMI) 35.0-35.9, adult: Secondary | ICD-10-CM | POA: Insufficient documentation

## 2016-06-02 DIAGNOSIS — Z993 Dependence on wheelchair: Secondary | ICD-10-CM | POA: Diagnosis not present

## 2016-06-02 DIAGNOSIS — S2241XA Multiple fractures of ribs, right side, initial encounter for closed fracture: Secondary | ICD-10-CM | POA: Insufficient documentation

## 2016-06-02 DIAGNOSIS — Z803 Family history of malignant neoplasm of breast: Secondary | ICD-10-CM | POA: Insufficient documentation

## 2016-06-02 DIAGNOSIS — Z8673 Personal history of transient ischemic attack (TIA), and cerebral infarction without residual deficits: Secondary | ICD-10-CM | POA: Diagnosis not present

## 2016-06-02 DIAGNOSIS — F419 Anxiety disorder, unspecified: Secondary | ICD-10-CM | POA: Diagnosis not present

## 2016-06-02 DIAGNOSIS — Z853 Personal history of malignant neoplasm of breast: Secondary | ICD-10-CM | POA: Diagnosis not present

## 2016-06-02 DIAGNOSIS — I6789 Other cerebrovascular disease: Secondary | ICD-10-CM | POA: Diagnosis not present

## 2016-06-02 DIAGNOSIS — Z8249 Family history of ischemic heart disease and other diseases of the circulatory system: Secondary | ICD-10-CM | POA: Diagnosis not present

## 2016-06-02 DIAGNOSIS — R531 Weakness: Principal | ICD-10-CM | POA: Diagnosis present

## 2016-06-02 DIAGNOSIS — R29810 Facial weakness: Secondary | ICD-10-CM | POA: Insufficient documentation

## 2016-06-02 DIAGNOSIS — E785 Hyperlipidemia, unspecified: Secondary | ICD-10-CM | POA: Insufficient documentation

## 2016-06-02 DIAGNOSIS — I10 Essential (primary) hypertension: Secondary | ICD-10-CM | POA: Diagnosis not present

## 2016-06-02 DIAGNOSIS — N390 Urinary tract infection, site not specified: Secondary | ICD-10-CM | POA: Diagnosis not present

## 2016-06-02 DIAGNOSIS — Y9289 Other specified places as the place of occurrence of the external cause: Secondary | ICD-10-CM | POA: Diagnosis not present

## 2016-06-02 DIAGNOSIS — Z86011 Personal history of benign neoplasm of the brain: Secondary | ICD-10-CM | POA: Diagnosis not present

## 2016-06-02 DIAGNOSIS — Z9861 Coronary angioplasty status: Secondary | ICD-10-CM | POA: Diagnosis not present

## 2016-06-02 DIAGNOSIS — E669 Obesity, unspecified: Secondary | ICD-10-CM | POA: Insufficient documentation

## 2016-06-02 DIAGNOSIS — S2231XA Fracture of one rib, right side, initial encounter for closed fracture: Secondary | ICD-10-CM | POA: Diagnosis present

## 2016-06-02 DIAGNOSIS — I672 Cerebral atherosclerosis: Secondary | ICD-10-CM | POA: Diagnosis not present

## 2016-06-02 DIAGNOSIS — R4781 Slurred speech: Secondary | ICD-10-CM | POA: Insufficient documentation

## 2016-06-02 DIAGNOSIS — I700 Atherosclerosis of aorta: Secondary | ICD-10-CM | POA: Diagnosis not present

## 2016-06-02 DIAGNOSIS — S2249XA Multiple fractures of ribs, unspecified side, initial encounter for closed fracture: Secondary | ICD-10-CM

## 2016-06-02 DIAGNOSIS — I6523 Occlusion and stenosis of bilateral carotid arteries: Secondary | ICD-10-CM | POA: Diagnosis not present

## 2016-06-02 DIAGNOSIS — Z9889 Other specified postprocedural states: Secondary | ICD-10-CM | POA: Diagnosis not present

## 2016-06-02 DIAGNOSIS — M6289 Other specified disorders of muscle: Secondary | ICD-10-CM | POA: Diagnosis not present

## 2016-06-02 DIAGNOSIS — Z8603 Personal history of neoplasm of uncertain behavior: Secondary | ICD-10-CM

## 2016-06-02 LAB — CBC WITH DIFFERENTIAL/PLATELET
BASOS ABS: 0 10*3/uL (ref 0.0–0.1)
BASOS PCT: 1 %
EOS PCT: 0 %
Eosinophils Absolute: 0 10*3/uL (ref 0.0–0.7)
HCT: 45.3 % (ref 36.0–46.0)
Hemoglobin: 15.1 g/dL — ABNORMAL HIGH (ref 12.0–15.0)
LYMPHS PCT: 26 %
Lymphs Abs: 2 10*3/uL (ref 0.7–4.0)
MCH: 29.8 pg (ref 26.0–34.0)
MCHC: 33.3 g/dL (ref 30.0–36.0)
MCV: 89.5 fL (ref 78.0–100.0)
Monocytes Absolute: 0.8 10*3/uL (ref 0.1–1.0)
Monocytes Relative: 10 %
NEUTROS ABS: 5 10*3/uL (ref 1.7–7.7)
Neutrophils Relative %: 63 %
PLATELETS: 205 10*3/uL (ref 150–400)
RBC: 5.06 MIL/uL (ref 3.87–5.11)
RDW: 13.2 % (ref 11.5–15.5)
WBC: 7.9 10*3/uL (ref 4.0–10.5)

## 2016-06-02 LAB — COMPREHENSIVE METABOLIC PANEL
ALK PHOS: 86 U/L (ref 38–126)
ALT: 22 U/L (ref 14–54)
ANION GAP: 11 (ref 5–15)
AST: 32 U/L (ref 15–41)
Albumin: 4.1 g/dL (ref 3.5–5.0)
BILIRUBIN TOTAL: 1.4 mg/dL — AB (ref 0.3–1.2)
BUN: 11 mg/dL (ref 6–20)
CALCIUM: 9.5 mg/dL (ref 8.9–10.3)
CO2: 22 mmol/L (ref 22–32)
Chloride: 102 mmol/L (ref 101–111)
Creatinine, Ser: 0.68 mg/dL (ref 0.44–1.00)
GFR calc Af Amer: >60 mL/min (ref >60)
GFR calc non Af Amer: >60 mL/min (ref >60)
GLUCOSE: 103 mg/dL — AB (ref 65–99)
Potassium: 4.9 mmol/L (ref 3.5–5.1)
SODIUM: 135 mmol/L (ref 135–145)
Total Protein: 6.7 g/dL (ref 6.5–8.1)

## 2016-06-02 LAB — I-STAT TROPONIN, ED: TROPONIN I, POC: 0 ng/mL (ref 0.00–0.08)

## 2016-06-02 LAB — URINALYSIS, ROUTINE W REFLEX MICROSCOPIC
BILIRUBIN URINE: NEGATIVE
GLUCOSE, UA: NEGATIVE mg/dL
KETONES UR: NEGATIVE mg/dL
Nitrite: NEGATIVE
PH: 6.5 (ref 5.0–8.0)
PROTEIN: NEGATIVE mg/dL
Specific Gravity, Urine: 1.017 (ref 1.005–1.030)

## 2016-06-02 LAB — PROTIME-INR
INR: 1.03
PROTHROMBIN TIME: 13.5 s (ref 11.4–15.2)

## 2016-06-02 LAB — URINE MICROSCOPIC-ADD ON

## 2016-06-02 LAB — RAPID URINE DRUG SCREEN, HOSP PERFORMED
Amphetamines: NOT DETECTED
BARBITURATES: NOT DETECTED
BENZODIAZEPINES: POSITIVE — AB
Cocaine: NOT DETECTED
Opiates: NOT DETECTED
Tetrahydrocannabinol: NOT DETECTED

## 2016-06-02 LAB — I-STAT CHEM 8, ED
BUN: 15 mg/dL (ref 6–20)
CALCIUM ION: 1.04 mmol/L — AB (ref 1.15–1.40)
CHLORIDE: 99 mmol/L — AB (ref 101–111)
CREATININE: 0.5 mg/dL (ref 0.44–1.00)
GLUCOSE: 109 mg/dL — AB (ref 65–99)
HCT: 47 % — ABNORMAL HIGH (ref 36.0–46.0)
Hemoglobin: 16 g/dL — ABNORMAL HIGH (ref 12.0–15.0)
POTASSIUM: 4.6 mmol/L (ref 3.5–5.1)
Sodium: 134 mmol/L — ABNORMAL LOW (ref 135–145)
TCO2: 26 mmol/L (ref 0–100)

## 2016-06-02 LAB — ETHANOL: Alcohol, Ethyl (B): <5 mg/dL (ref <5)

## 2016-06-02 LAB — APTT: APTT: 28 s (ref 24–36)

## 2016-06-02 LAB — VITAMIN B12: VITAMIN B 12: 435 pg/mL (ref 180–914)

## 2016-06-02 LAB — AMMONIA: AMMONIA: 25 umol/L (ref 9–35)

## 2016-06-02 LAB — TSH: TSH: 0.79 u[IU]/mL (ref 0.350–4.500)

## 2016-06-02 MED ORDER — OXYCODONE-ACETAMINOPHEN 5-325 MG PO TABS
2.0000 | ORAL_TABLET | Freq: Three times a day (TID) | ORAL | Status: DC
  Administered 2016-06-02 – 2016-06-05 (×9): 2 via ORAL
  Filled 2016-06-02 (×9): qty 2

## 2016-06-02 MED ORDER — POLYETHYLENE GLYCOL 3350 17 G PO PACK
17.0000 g | PACK | Freq: Every day | ORAL | Status: DC | PRN
  Administered 2016-06-03 – 2016-06-05 (×3): 17 g via ORAL
  Filled 2016-06-02 (×3): qty 1

## 2016-06-02 MED ORDER — STROKE: EARLY STAGES OF RECOVERY BOOK
Freq: Once | Status: AC
  Administered 2016-06-02: 21:00:00
  Filled 2016-06-02: qty 1

## 2016-06-02 MED ORDER — LORAZEPAM 1 MG PO TABS
1.0000 mg | ORAL_TABLET | Freq: Two times a day (BID) | ORAL | Status: DC
  Administered 2016-06-02 – 2016-06-05 (×6): 1 mg via ORAL
  Filled 2016-06-02 (×6): qty 1

## 2016-06-02 MED ORDER — ADULT MULTIVITAMIN W/MINERALS CH
1.0000 | ORAL_TABLET | ORAL | Status: DC
  Administered 2016-06-03 – 2016-06-05 (×3): 1 via ORAL
  Filled 2016-06-02 (×3): qty 1

## 2016-06-02 MED ORDER — METHOCARBAMOL 500 MG PO TABS
500.0000 mg | ORAL_TABLET | Freq: Three times a day (TID) | ORAL | Status: DC
  Administered 2016-06-02 – 2016-06-05 (×8): 500 mg via ORAL
  Filled 2016-06-02 (×8): qty 1

## 2016-06-02 MED ORDER — BACLOFEN 10 MG PO TABS
20.0000 mg | ORAL_TABLET | Freq: Four times a day (QID) | ORAL | Status: DC
  Administered 2016-06-02 – 2016-06-05 (×10): 20 mg via ORAL
  Filled 2016-06-02 (×10): qty 2

## 2016-06-02 MED ORDER — ENOXAPARIN SODIUM 40 MG/0.4ML ~~LOC~~ SOLN
40.0000 mg | SUBCUTANEOUS | Status: DC
  Administered 2016-06-02 – 2016-06-04 (×3): 40 mg via SUBCUTANEOUS
  Filled 2016-06-02 (×3): qty 0.4

## 2016-06-02 MED ORDER — ATENOLOL 25 MG PO TABS
50.0000 mg | ORAL_TABLET | Freq: Every day | ORAL | Status: DC
  Administered 2016-06-03 – 2016-06-05 (×3): 50 mg via ORAL
  Filled 2016-06-02 (×3): qty 2

## 2016-06-02 MED ORDER — DOCUSATE SODIUM 100 MG PO CAPS
100.0000 mg | ORAL_CAPSULE | Freq: Two times a day (BID) | ORAL | Status: DC
  Administered 2016-06-02 – 2016-06-05 (×6): 100 mg via ORAL
  Filled 2016-06-02 (×6): qty 1

## 2016-06-02 MED ORDER — ATORVASTATIN CALCIUM 10 MG PO TABS
10.0000 mg | ORAL_TABLET | ORAL | Status: DC
  Administered 2016-06-03 – 2016-06-05 (×3): 10 mg via ORAL
  Filled 2016-06-02 (×3): qty 1

## 2016-06-02 MED ORDER — LETROZOLE 2.5 MG PO TABS
2.5000 mg | ORAL_TABLET | Freq: Every day | ORAL | Status: DC
  Administered 2016-06-02 – 2016-06-04 (×3): 2.5 mg via ORAL
  Filled 2016-06-02 (×3): qty 1

## 2016-06-02 MED ORDER — CLOPIDOGREL BISULFATE 75 MG PO TABS
75.0000 mg | ORAL_TABLET | Freq: Every day | ORAL | Status: DC
  Administered 2016-06-03 – 2016-06-05 (×3): 75 mg via ORAL
  Filled 2016-06-02 (×3): qty 1

## 2016-06-02 MED ORDER — OXCARBAZEPINE 300 MG PO TABS
300.0000 mg | ORAL_TABLET | Freq: Two times a day (BID) | ORAL | Status: DC
  Administered 2016-06-02 – 2016-06-05 (×6): 300 mg via ORAL
  Filled 2016-06-02 (×6): qty 1

## 2016-06-02 MED ORDER — DIAZEPAM 5 MG PO TABS
5.0000 mg | ORAL_TABLET | Freq: Every day | ORAL | Status: DC
  Administered 2016-06-02 – 2016-06-04 (×3): 5 mg via ORAL
  Filled 2016-06-02 (×3): qty 1

## 2016-06-02 MED ORDER — IOPAMIDOL (ISOVUE-370) INJECTION 76%
INTRAVENOUS | Status: AC
  Administered 2016-06-02: 50 mL
  Filled 2016-06-02: qty 50

## 2016-06-02 MED ORDER — ATENOLOL 25 MG PO TABS: 25.0000 mg | ORAL_TABLET | ORAL | Status: DC

## 2016-06-02 MED ORDER — ATENOLOL 25 MG PO TABS
25.0000 mg | ORAL_TABLET | Freq: Every day | ORAL | Status: DC
  Administered 2016-06-02 – 2016-06-04 (×3): 25 mg via ORAL
  Filled 2016-06-02 (×3): qty 1

## 2016-06-02 NOTE — Progress Notes (Signed)
ED RN notified Stroke Team of patient's RUE being flaccid. Upon arrival, Neuro MD at bedside. Pt was not able to mover her arm against gravity but was able to grip with her hand. About 5 minutes after Stroke Team arrival, pt regained full movement in her RUE. No new orders received. Bedside handoff to ED RN Judson Roch.

## 2016-06-02 NOTE — Progress Notes (Signed)
Patient arrived from the ED to room 5M22. Safety precautions reviewed with patient. VSS. TELE applied and confirmed. No other distress noted. Will continue to monitor.   Ave Filter, RN

## 2016-06-02 NOTE — ED Notes (Signed)
Lab called reports hemolyzed purple, blue top. Labs reordered. Phlebotomy made aware.

## 2016-06-02 NOTE — ED Provider Notes (Addendum)
[facility] DEPT Provider Note   CSN: [id number] Arrival date & time: 06/02/16  1421   An emergency department physician performed an initial assessment on this suspected stroke patient at 1422.  History   Chief Complaint Chief Complaint  Patient presents with  . Code Stroke    HPI Stacy Diaz is a 65 y.o. female.  HPI Patient came in as a code stroke. Last normal at 140. Husband noticed some confusion and right-sided weakness. Has had a previous meningioma and has chronic weakness in her legs. She has about 95% of the strength back in her left upper extremity. I met the patient in the CT scanner with neurology Harding at bedside. Head CT reassuring. Deficits had improved. Has had recent fall. Initial imaging done and code stroke canceled by neurology.   Past Medical History:  Diagnosis Date  . Anxiety   . Arthritis    "probably" (08/25/2012)  . Breast cancer of lower-outer quadrant of left female breast ([place]) 05/25/2014  . Headache(784.0)    "not real frequent" (08/25/2012)  . Heart disease   . HTN (hypertension)   . Hyperlipidemia   . Meningioma ([place]) 1999  . Myocardial infarction 1987  . 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 S/P radiation therapy 07/30/14-08/30/13   left breast cancer/50Gy  . Seizures ([place])    last seizure 1 month ago  . Spastic paraparesis    S/P meningioma resection 1999/notes 08/25/2012  . Stroke [facility]) 08/25/2012   "that's what they think I've had; sudden LUE weakness" (08/25/2012)    Patient Active Problem List   Diagnosis Date Noted  . Acute right-sided weakness 06/02/2016  . Bacterial folliculitis 40/98/1191  . Family history of breast cancer 06/07/2014  . Breast cancer of lower-outer quadrant of left female breast ([place]) 05/25/2014  . Partial symptomatic epilepsy with complex partial seizures, not intractable, without status epilepticus ([place]) 04/29/2014  . History of stroke 04/29/2014  . CVA (cerebral infarction)  09/05/2012  . Facial droop 09/02/2012  . UTI (lower urinary tract infection) 08/26/2012  . 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 Boil of buttock 09/28/2011  . BENIGN NEOPLASM OF BRAIN 03/31/2010  . INJURY, NERVE, PELVIS/LWR LIMB NOS 05/20/2007  . Hyperlipidemia 05/12/2007  . Essential hypertension 05/12/2007  . PREMATURE VENTRICULAR CONTRACTIONS 05/12/2007    Past Surgical History:  Procedure Laterality Date  . BRAIN MENINGIOMA EXCISION  1999  . BREAST LUMPECTOMY WITH NEEDLE LOCALIZATION AND AXILLARY SENTINEL LYMPH NODE BX Left 06/22/2014   Procedure: LEFT WIRE LOCALIZED LUMPECTOPMY AND SENTINEL NODE MAPPING;  Surgeon: Autumn Messing III, MD;  Location: [facility];  Service: General;  Laterality: Left;  . CARDIAC CATHETERIZATION  1987  . CORONARY ANGIOPLASTY  1987   denied intervention: "blockage wasn't bad enough"    OB History    No data available       Home Medications    Prior to Admission medications   Medication Sig Start Date End Date Taking? Authorizing Provider  atenolol (TENORMIN) 50 MG tablet 1 tablet in the morning....... half a tab at bedtime Patient taking differently: Take 25-50 mg by mouth See admin instructions. 1 tablet in the morning....... half a tab at bedtime 04/21/16  Yes Dorena Cookey, MD  atorvastatin (LIPITOR) 10 MG tablet Take 1 tablet (10 mg total) by mouth daily. Patient taking differently: Take 10 mg by mouth every morning.  04/21/16  Yes Dorena Cookey, MD  baclofen (LIORESAL) 20 MG tablet TAKE 1 TABLET (  20 MG TOTAL) BY MOUTH 4 (FOUR) TIMES DAILY. 04/21/16  Yes Dorena Cookey, MD  clopidogrel (PLAVIX) 75 MG tablet Take 1 tablet (75 mg total) by mouth daily with breakfast. 04/21/16  Yes Dorena Cookey, MD  diazepam (VALIUM) 5 MG tablet TAKE 1 TABLET BY MOUTH AT BEDTIME Patient taking differently: Take 5 mg by mouth at bedtime 02/21/16  Yes Dorena Cookey, MD  docusate sodium (COLACE) 100 MG capsule Take 100 mg by mouth 2 (two) times daily.   Yes Historical  Provider, MD  letrozole (FEMARA) 2.5 MG tablet Take 1 tablet (2.5 mg total) by mouth daily. Patient taking differently: Take 2.5 mg by mouth at bedtime.  06/25/15  Yes Nicholas Lose, MD  LORazepam (ATIVAN) 1 MG tablet Take 1 tablet (1 mg total) by mouth 2 (two) times daily as needed. for anxiety Patient taking differently: Take 1 mg by mouth 2 (two) times daily. for anxiety 03/11/16  Yes Dorena Cookey, MD  methocarbamol (ROBAXIN) 500 MG tablet Take 1 tablet (500 mg total) by mouth 3 (three) times daily. 10/14/15  Yes Dorena Cookey, MD  Multiple Vitamin (MULTIVITAMIN WITH MINERALS) TABS tablet Take 1 tablet by mouth every morning.    Yes Historical Provider, MD  OVER THE COUNTER MEDICATION Apply 1 application topically daily as needed (pain). Aspercreme with 5% lidocaine   Yes Historical Provider, MD  Oxcarbazepine (TRILEPTAL) 300 MG tablet Take 0.5 tablets (150 mg total) by mouth 2 (two) times daily. Start with 1/2 tablet twice daily x 2 weeks then one tablet twice daily Patient taking differently: Take 300 mg by mouth 2 (two) times daily.  01/22/16  Yes Garvin Fila, MD  oxyCODONE-acetaminophen (PERCOCET/ROXICET) 5-325 MG tablet Take 2 tablets by mouth 3 (three) times daily. 04/21/16  Yes Dorena Cookey, MD  polyethylene glycol [facility] / GLYCOLAX) packet Take 17 g by mouth daily as needed (for constipation). Mix with liquid and drink   Yes Historical Provider, MD  zinc oxide (BALMEX) 11.3 % CREA cream Apply 1 application topically at bedtime as needed (IRRITATION).    Yes Historical Provider, MD  ciprofloxacin (CIPRO) 500 MG tablet Take 1 tablet (500 mg total) by mouth 2 (two) times daily. Patient not taking: Reported on 06/02/2016 04/30/16   [name], NP  fluconazole (DIFLUCAN) 150 MG tablet Take 1 tablet (150 mg total) by mouth daily. Patient not taking: Reported on 06/02/2016 04/30/16   [name], NP  Melatonin 3 MG TABS Take 1 tablet (3 mg total) by mouth at bedtime. Patient not taking:  Reported on 06/02/2016 12/07/12   Dorena Cookey, MD    Family History Family History  Problem Relation Age of Onset  . Breast cancer Mother 48    unilateral  . Heart disease Father   . Breast cancer Paternal Grandmother 82    unilateral    Social History Social History  Substance Use Topics  . Smoking status: Former Smoker    Packs/day: 2.00    Years: 10.00    Types: Cigarettes  . Smokeless tobacco: Never Used     Comment: 08/25/2012 "quit smoking cigarettes in 1985 when I had my heart attack"  . Alcohol use 4.2 oz/week    7 Glasses of wine per week     Comment: occasionally     Allergies   Review of patient's allergies indicates no known allergies.   Review of Systems Review of Systems  Constitutional: Negative for appetite change.  HENT: Negative for congestion.  Respiratory: Negative for chest tightness.   Cardiovascular: Negative for chest pain.  Gastrointestinal: Negative for abdominal pain.  Genitourinary: Negative for flank pain.  Musculoskeletal: Negative for back pain.  Neurological: Positive for speech difficulty and weakness.  Psychiatric/Behavioral: Positive for confusion.     Physical Exam Updated Vital Signs BP 174/98   Pulse 68   Temp 97.7 F (36.5 C) (Oral)   Resp 14   SpO2 100%   Physical Exam  Constitutional: She appears well-developed.  HENT:  Head: Atraumatic.  Eyes: EOM are normal.  Neck: Neck supple.  Cardiovascular: Normal rate.   Pulmonary/Chest: Effort normal.  Abdominal: Soft. There is no tenderness.  Musculoskeletal: She exhibits no tenderness.  Chronic decreased movement in lower extremities.  Neurological:  Patient is awake and appropriate. States she had been having evidently speaking. Decent grip strength bilaterally. Finger-nose done and was intact bilaterally. Chronic weakness of both legs. Initial NIH score done by neurology was 9 however her baseline is an 8.  Skin: Skin is warm. Capillary refill takes more than 3  seconds.  Psychiatric: She has a normal mood and affect.     ED Treatments / Results  Labs (all labs ordered are listed, but only abnormal results are displayed) Labs Reviewed  COMPREHENSIVE METABOLIC PANEL - Abnormal; Notable for the following:       Result Value   Glucose, Bld 103 (*)    Total Bilirubin 1.4 (*)    All other components within normal limits  CBC WITH DIFFERENTIAL/PLATELET - Abnormal; Notable for the following:    Hemoglobin 15.1 (*)    All other components within normal limits  I-STAT CHEM 8, ED - Abnormal; Notable for the following:    Sodium 134 (*)    Chloride 99 (*)    Glucose, Bld 109 (*)    Calcium, Ion 1.04 (*)    Hemoglobin 16.0 (*)    HCT 47.0 (*)    All other components within normal limits  URINE CULTURE  ETHANOL  PROTIME-INR  APTT  RAPID URINE DRUG SCREEN, HOSP PERFORMED  10-HYDROXYCARBAZEPINE  AMMONIA  TSH  VITAMIN B12  URINALYSIS, ROUTINE W REFLEX MICROSCOPIC (NOT AT [facility])  I-STAT TROPOININ, ED  I-STAT TROPOININ, ED    EKG  EKG Interpretation  Date/Time:  Tuesday June 02 2016 14:53:29 EDT Ventricular Rate:  74 PR Interval:    QRS Duration: 110 QT Interval:  403 QTC Calculation: 448 R Axis:   -30 Text Interpretation:  Sinus rhythm Probable left atrial enlargement Left axis deviation Anterior infarct, age indeterminate Confirmed by Alvino Chapel  MD, Sam Overbeck [phone]) on 06/02/2016 4:34:18 PM       Radiology Ct Angio Head W Or Wo Contrast  Result Date: 06/02/2016 CLINICAL DATA:  Right arm weakness and slurred speech. Prior meningioma resection. EXAM: CT ANGIOGRAPHY HEAD AND NECK TECHNIQUE: Multidetector CT imaging of the head and neck was performed using the standard protocol during bolus administration of intravenous contrast. Multiplanar CT image reconstructions and MIPs were obtained to evaluate the vascular anatomy. Carotid stenosis measurements (when applicable) are obtained utilizing NASCET criteria, using the distal internal  carotid diameter as the denominator. CONTRAST:  50 mL Isovue 370 COMPARISON:  Head and neck CTA 08/26/2012 FINDINGS: CTA NECK FINDINGS Aortic arch: 3 vessel aortic arch with minimal atherosclerosis. Arch vessel origins are widely patent. Right carotid system: Patent with focal calcified plaque in the proximal ICA. No stenosis. Left carotid system: Patent with minimal non stenotic plaque at the carotid bifurcation. Vertebral arteries: Patent without  evidence of stenosis. The left vertebral artery is dominant. Both vertebral arteries are somewhat tortuous proximally. Skeleton: Moderately advanced cervical facet arthrosis, greater on the left. Cervical disc degeneration most advanced at C5-6. Other neck: No evidence of acute abnormality or mass. Upper chest: Unremarkable. Review of the MIP images confirms the above findings CTA HEAD FINDINGS Anterior circulation: Internal carotid arteries are widely patent from skullbase to carotid termini. ACAs and MCAs are patent without evidence of major branch occlusion or significant proximal stenosis. Right A2 segment again noted to be dominant. Mild branch vessel irregularity. No aneurysm identified. Posterior circulation: Intracranial vertebral arteries are patent to the basilar with the left being dominant. Patent PICA, AICA, and SCA origins. Basilar artery is widely patent. Posterior communicating arteries are not identified. PCAs are patent with mild asymmetric attenuation of distal right PCA branch vessels but no evidence of significant proximal stenosis. No aneurysm identified. Venous sinuses: Not well evaluated due to arterial phase contrast timing. Anatomic variants: None of significance. Review of the MIP images confirms the above findings IMPRESSION: Very mild intracranial and extracranial atherosclerosis without evidence of major vessel occlusion or significant stenosis. These results were called by telephone at the time of interpretation on 06/02/2016 at 3:07 pm to  Dr. Jaynee Eagles, who verbally acknowledged these results. Electronically Signed   By: Logan Bores M.D.   On: 06/02/2016 15:16   Dg Ribs Unilateral W/chest Right  Result Date: 06/01/2016 CLINICAL DATA:  Fall on Friday, right rib pain EXAM: RIGHT RIBS AND CHEST - 3+ VIEW COMPARISON:  None. FINDINGS: Five views right ribs submitted. There is nondisplaced fracture of the eighth rib. Minimal displaced fracture of the right fifth rib. No pneumothorax. IMPRESSION: There is nondisplaced fracture of the eighth rib. Minimal displaced fracture of the right fifth rib. No pneumothorax. Electronically Signed   By: Lahoma Crocker M.D.   On: 06/01/2016 12:24   Dg Sternum  Result Date: 06/01/2016 CLINICAL DATA:  Fall on Friday, right rib pain EXAM: STERNUM - 2+ VIEW COMPARISON:  None. FINDINGS: There is no evidence of fracture or other focal bone lesions. IMPRESSION: Negative. Electronically Signed   By: Lahoma Crocker M.D.   On: 06/01/2016 12:25   Ct Angio Neck W Or Wo Contrast  Result Date: 06/02/2016 CLINICAL DATA:  Right arm weakness and slurred speech. Prior meningioma resection. EXAM: CT ANGIOGRAPHY HEAD AND NECK TECHNIQUE: Multidetector CT imaging of the head and neck was performed using the standard protocol during bolus administration of intravenous contrast. Multiplanar CT image reconstructions and MIPs were obtained to evaluate the vascular anatomy. Carotid stenosis measurements (when applicable) are obtained utilizing NASCET criteria, using the distal internal carotid diameter as the denominator. CONTRAST:  50 mL Isovue 370 COMPARISON:  Head and neck CTA 08/26/2012 FINDINGS: CTA NECK FINDINGS Aortic arch: 3 vessel aortic arch with minimal atherosclerosis. Arch vessel origins are widely patent. Right carotid system: Patent with focal calcified plaque in the proximal ICA. No stenosis. Left carotid system: Patent with minimal non stenotic plaque at the carotid bifurcation. Vertebral arteries: Patent without evidence of  stenosis. The left vertebral artery is dominant. Both vertebral arteries are somewhat tortuous proximally. Skeleton: Moderately advanced cervical facet arthrosis, greater on the left. Cervical disc degeneration most advanced at C5-6. Other neck: No evidence of acute abnormality or mass. Upper chest: Unremarkable. Review of the MIP images confirms the above findings CTA HEAD FINDINGS Anterior circulation: Internal carotid arteries are widely patent from skullbase to carotid termini. ACAs and MCAs are patent without  evidence of major branch occlusion or significant proximal stenosis. Right A2 segment again noted to be dominant. Mild branch vessel irregularity. No aneurysm identified. Posterior circulation: Intracranial vertebral arteries are patent to the basilar with the left being dominant. Patent PICA, AICA, and SCA origins. Basilar artery is widely patent. Posterior communicating arteries are not identified. PCAs are patent with mild asymmetric attenuation of distal right PCA branch vessels but no evidence of significant proximal stenosis. No aneurysm identified. Venous sinuses: Not well evaluated due to arterial phase contrast timing. Anatomic variants: None of significance. Review of the MIP images confirms the above findings IMPRESSION: Very mild intracranial and extracranial atherosclerosis without evidence of major vessel occlusion or significant stenosis. These results were called by telephone at the time of interpretation on 06/02/2016 at 3:07 pm to Dr. Jaynee Eagles, who verbally acknowledged these results. Electronically Signed   By: Logan Bores M.D.   On: 06/02/2016 15:16   Ct Head Code Stroke Wo Contrast  Result Date: 06/02/2016 CLINICAL DATA:  Code stroke. Right-sided weakness, slurred speech, and confusion. EXAM: CT HEAD WITHOUT CONTRAST TECHNIQUE: Contiguous axial images were obtained from the base of the skull through the vertex without intravenous contrast. COMPARISON:  05/30/2013 FINDINGS: Brain:  Chronic craniectomy changes involving the posterior skull and vertex with wires and clips which extend into the interhemispheric fissure and result in streak artifact, mildly limiting assessment of this region. Chronic encephalomalacia and gliosis in the superior aspects of both cerebral hemispheres are similar to the prior study. A small chronic infarct involving the anterior right basal ganglia/ white matter adjacent to the frontal horn is unchanged. There is no definite evidence of acute cortical infarct, intracranial hemorrhage, mass, midline shift, or extra-axial fluid collection. Vascular: No hyperdense vessel or unexpected calcification. Skull: Craniectomy changes and unchanged diffuse skull heterogeneity predominantly in the frontal region. Sinuses/Orbits: Unremarkable orbits. Visualized paranasal sinuses and mastoid air cells are clear. Other: None. ASPECTS [facility] Stroke Program Early CT Score) - Ganglionic level infarction (caudate, lentiform nuclei, internal capsule, insula, M1-M3 cortex): 7 - Supraganglionic infarction (M4-M6 cortex): 3 Total score (0-10 with 10 being normal): 10 IMPRESSION: 1. No evidence of acute intracranial abnormality. 2. ASPECTS is 10. 3. Chronic changes from prior meningioma resection. 4. Chronic right anterior basal ganglia infarct. These results were called by telephone at the time of interpretation on 06/02/2016 at 2:44 pm to Dr. Jaynee Eagles, who verbally acknowledged these results. Electronically Signed   By: Logan Bores M.D.   On: 06/02/2016 14:46    Procedures Procedures (including critical care time)  Medications Ordered in ED Medications  iopamidol (ISOVUE-370) 76 % injection (50 mLs  Contrast Given 06/02/16 1438)     Initial Impression / Assessment and Plan / ED Course  I have reviewed the triage vital signs and the nursing notes.  Pertinent labs & imaging results that were available during my care of the patient were reviewed by me and considered in my  medical decision making (see chart for details).  Clinical Course    Patient with initial right-sided weakness that had resolved. Had some confusion and difficulty speaking. Symptoms had improved dramatically and code stroke was canceled by neurology. At around 310 symptoms returned. Her right arm was flaccid. When lifted up to drop over face. She had had some mild confusion with the episode. Neurology contacted and Dr Jaynee Eagles came down to see the patient again. They believe that she is not a TPA candidate at this time. She has had previous meningioma surgery and cannot  get an MRI. Will admit to internal medicine. CT and CT angiography reassuring.  Final Clinical Impressions(s) / ED Diagnoses   Final diagnoses:  Acute right-sided weakness    New Prescriptions New Prescriptions   No medications on file     Davonna Belling, MD 06/02/16 [place], MD 06/02/16 [phone]

## 2016-06-02 NOTE — H&P (Signed)
History and Physical    Stacy Diaz [id number] DOB: 23-Nov-1950 DOA: 06/02/2016   PCP: Joycelyn Man, MD   Patient coming from/Resides with: Private residence/with husband  Admission status: Observation/telemetry -Diaz will need to re-evaluate in the next 24 hours to determine if it will be medically necessary to stay a minimum 2 midnights to rule out impending and/or unexpected changes in physiologic status that may differ from initial evaluation performed in the ER and/or at time of admission. Patient presents with right side weakness, facial drooping and dysarthria that lasted for several hours and improved after arrival to ER. Symptoms recurred several hours later. Patient with complicated neurological/neurosurgical history involving seizure disorder and similar presentation in 2014 without a definitive diagnosis of either stroke or seizure mediated etiology to symptoms. Patient will undergo full TIA/CVA evaluation without MRI due to clips and brain from previous meningioma resection.  Chief Complaint: Right-sided weakness with facial drooping and dysarthria  HPI: Stacy Diaz is a 65 y.o. female with medical history significant for meningioma resection in 1998 resulting in bilateral lower extremity paralysis, history of strokelike symptoms without definitive diagnosis in 2014, apparent formal diagnosis of CVA in 2016, seizure disorder with apparent recurrent seizures multiple adjustments in AEDs over the years, history of breast cancer, lipidemia, hypertension and recent treatment for pansensitive Escherichia coli UTI last month. Of note patient fell out of her wheelchair recently an x-ray performed yesterday revealed 2 right rib fractures.  Patient was in her usual state of health and actually had a friend over this morning and was doing well until about 11:30 AM when she developed abrupt onset of right-sided facial drooping and right-sided weakness w/ dysarthria. EMS was called  and patient was brought to the ER. A Code stroke called but since symptoms had resolved upon arrival the code stroke was canceled. Several hours later with the patient still in the ER her symptoms reemerged. Not a candidate for TPA due to mild severity of symptoms. Initial CT head negative. Not a candidate for MRI secondary to intracranial clips from prior meningioma resection. Neurology following.  In discussion with the patient and her husband patient reports had done well post resection and had been stable on Tegretol for many years. In 2013 she followed up with neurosurgery who told her she was stable and did not need to return. She eventually followed up with neurology who weaned and discontinued her Tegretol. Subsequently thereafter in 2014 she had a presentation with left-sided hemiparesis that lasted 2-3 days. She was sent to a skilled nursing facility but had to return 2 days later to the ER for worsening symptoms and remained in the hospital 5 days. Since that time she has had various adjustments in her antiepileptic drugs and she is primarily followed by Dr. Leonie Man. During that admission in 2014 a definitive diagnosis was never declared with husband stating that the neurologist felt either TIA/CVA or atypical seizures could be culprit. Since that time the patient has apparently had breakthrough seizures and has had adjustments in her anti-epileptic medications. Most recently she was changed from Vimpat to Trileptal. Of note she takes Ativan prn as well as nocturnal Valium. In mid-September she was treated with Cipro for a pansensitive Escherichia coli infection. In regards to the recent rib fracture she reports she is on Percocet which as been helpful in pain management  ED Course:  Vital Signs: BP 174/98   Pulse 68   Temp 97.7 F (36.5 C) (Oral)   Resp 14  SpO2 100%  CT head code stroke without contrast: No acute intracranial abnormality CT head angiography without contrast: Very mild  intracranial extracranial atherosclerosis without evidence of major vessel occlusion or significant stenosis PCXR: Aortic atherosclerosis no acute cardiopulmonary abnormality seen. Had detailed rib views yesterday which demonstrated right-sided rib fractures (see rib detail x-ray from 10/16) Lab data: Sodium 134, potassium 4.6, BUN 11, creatinine 0.68, glucose 13, LFTs normal except for mildly elevated total bilirubin 1.4, poc troponin 0.00, white count 7900 with normal differential, hemoglobin 15.1, platelets 205,000, coags normal, alcohol level less than 5 Medications and treatments: None  Review of Systems:  In addition to the HPI above,  No Fever-chills, myalgias or other constitutional symptoms No Headache, changes with Vision or hearing, dizziness, tremors or obvious seizure activity; does not ambulate secondary to known bilateral lower stroma T paresis from prior meningioma resection No problems swallowing food or Liquids, indigestion/reflux, choking or coughing while eating, abdominal pain with or after eating No Chest pain, Cough or Shortness of Breath, palpitations, orthopnea or DOE No Abdominal pain, N/V, melena,hematochezia, dark tarry stools, constipation No dysuria, malodorous urine, hematuria or flank pain No new skin rashes, lesions, masses or bruises, No new joint pains, aches, swelling or redness No recent unintentional weight gain or loss No polyuria, polydypsia or polyphagia   Past Medical History:  Diagnosis Date  . Anxiety   . Arthritis    "probably" (08/25/2012)  . Breast cancer of lower-outer quadrant of left female breast ([place]) 05/25/2014  . Headache(784.0)    "not real frequent" (08/25/2012)  . Heart disease   . HTN (hypertension)   . Hyperlipidemia   . Meningioma ([place]) 1999  . Myocardial infarction 1987  . 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 S/P radiation therapy 07/30/14-08/30/13   left breast cancer/50Gy  . Seizures ([place])    last  seizure 1 month ago  . Spastic paraparesis    S/P meningioma resection 1999/notes 08/25/2012  . Stroke [facility]) 08/25/2012   "that's what they think I've had; sudden LUE weakness" (08/25/2012)    Past Surgical History:  Procedure Laterality Date  . BRAIN MENINGIOMA EXCISION  1999  . BREAST LUMPECTOMY WITH NEEDLE LOCALIZATION AND AXILLARY SENTINEL LYMPH NODE BX Left 06/22/2014   Procedure: LEFT WIRE LOCALIZED LUMPECTOPMY AND SENTINEL NODE MAPPING;  Surgeon: Autumn Messing III, MD;  Location: [facility];  Service: General;  Laterality: Left;  . CARDIAC CATHETERIZATION  1987  . CORONARY ANGIOPLASTY  1987   denied intervention: "blockage wasn't bad enough"    Social History   Social History  . Marital status: Married    Spouse name: david  . Number of children: 2  . Years of education: college   Occupational History  . disabled    Social History Main Topics  . Smoking status: Former Smoker    Packs/day: 2.00    Years: 10.00    Types: Cigarettes  . Smokeless tobacco: Never Used     Comment: 08/25/2012 "quit smoking cigarettes in 1985 when I had my heart attack"  . Alcohol use 4.2 oz/week    7 Glasses of wine per week     Comment: occasionally  . Drug use: No  . Sexual activity: No   Other Topics Concern  . Not on file   Social History Narrative  . No narrative on file    Mobility: Wheelchair Work history: Not applicable   No Known Allergies  Family History  Problem Relation Age of Onset  .  Breast cancer Mother 108    unilateral  . Heart disease Father   . Breast cancer Paternal Grandmother 81    unilateral     Prior to Admission medications   Medication Sig Start Date End Date Taking? Authorizing Provider  atenolol (TENORMIN) 50 MG tablet 1 tablet in the morning....... half a tab at bedtime Patient taking differently: Take 25-50 mg by mouth See admin instructions. 1 tablet in the morning....... half a tab at bedtime 04/21/16  Yes Dorena Cookey, MD  atorvastatin (LIPITOR) 10 MG  tablet Take 1 tablet (10 mg total) by mouth daily. Patient taking differently: Take 10 mg by mouth every morning.  04/21/16  Yes Dorena Cookey, MD  baclofen (LIORESAL) 20 MG tablet TAKE 1 TABLET (20 MG TOTAL) BY MOUTH 4 (FOUR) TIMES DAILY. 04/21/16  Yes Dorena Cookey, MD  clopidogrel (PLAVIX) 75 MG tablet Take 1 tablet (75 mg total) by mouth daily with breakfast. 04/21/16  Yes Dorena Cookey, MD  diazepam (VALIUM) 5 MG tablet TAKE 1 TABLET BY MOUTH AT BEDTIME Patient taking differently: Take 5 mg by mouth at bedtime 02/21/16  Yes Dorena Cookey, MD  docusate sodium (COLACE) 100 MG capsule Take 100 mg by mouth 2 (two) times daily.   Yes Historical Provider, MD  letrozole (FEMARA) 2.5 MG tablet Take 1 tablet (2.5 mg total) by mouth daily. Patient taking differently: Take 2.5 mg by mouth at bedtime.  06/25/15  Yes Nicholas Lose, MD  LORazepam (ATIVAN) 1 MG tablet Take 1 tablet (1 mg total) by mouth 2 (two) times daily as needed. for anxiety Patient taking differently: Take 1 mg by mouth 2 (two) times daily. for anxiety 03/11/16  Yes Dorena Cookey, MD  methocarbamol (ROBAXIN) 500 MG tablet Take 1 tablet (500 mg total) by mouth 3 (three) times daily. 10/14/15  Yes Dorena Cookey, MD  Multiple Vitamin (MULTIVITAMIN WITH MINERALS) TABS tablet Take 1 tablet by mouth every morning.    Yes Historical Provider, MD  OVER THE COUNTER MEDICATION Apply 1 application topically daily as needed (pain). Aspercreme with 5% lidocaine   Yes Historical Provider, MD  Oxcarbazepine (TRILEPTAL) 300 MG tablet Take 0.5 tablets (150 mg total) by mouth 2 (two) times daily. Start with 1/2 tablet twice daily x 2 weeks then one tablet twice daily Patient taking differently: Take 300 mg by mouth 2 (two) times daily.  01/22/16  Yes Garvin Fila, MD  oxyCODONE-acetaminophen (PERCOCET/ROXICET) 5-325 MG tablet Take 2 tablets by mouth 3 (three) times daily. 04/21/16  Yes Dorena Cookey, MD  polyethylene glycol [facility] / GLYCOLAX) packet Take 17 g  by mouth daily as needed (for constipation). Mix with liquid and drink   Yes Historical Provider, MD  zinc oxide (BALMEX) 11.3 % CREA cream Apply 1 application topically at bedtime as needed (IRRITATION).    Yes Historical Provider, MD  ciprofloxacin (CIPRO) 500 MG tablet Take 1 tablet (500 mg total) by mouth 2 (two) times daily. Patient not taking: Reported on 06/02/2016 04/30/16   [name], NP  fluconazole (DIFLUCAN) 150 MG tablet Take 1 tablet (150 mg total) by mouth daily. Patient not taking: Reported on 06/02/2016 04/30/16   [name], NP  Melatonin 3 MG TABS Take 1 tablet (3 mg total) by mouth at bedtime. Patient not taking: Reported on 06/02/2016 12/07/12   Dorena Cookey, MD    Physical Exam: Vitals:   06/02/16 1456 06/02/16 1515 06/02/16 1530  BP: 172/87 176/93 174/98  Pulse: 74 70 68  Resp: 22 17 14   Temp: 97.7 F (36.5 C)    TempSrc: Oral    SpO2: 100% 100% 100%      Constitutional: NAD, calm, comfortable-Currently not experiencing any chest wall pain from recent rib fractures Eyes: PERRL, lids and conjunctivae normal ENMT: Mucous membranes are dry. Posterior pharynx clear of any exudate or lesions.Normal dentition.  Neck: normal, supple, no masses, no thyromegaly Respiratory: clear to auscultation bilaterally, no wheezing, no crackles. Normal respiratory effort. No accessory muscle use.  Cardiovascular: Regular rate and rhythm, no murmurs / rubs / gallops. No extremity edema. 2+ pedal pulses. No carotid bruits.  Abdomen: no tenderness, no masses palpated. No hepatosplenomegaly. Bowel sounds positive.  Musculoskeletal: no clubbing / cyanosis. No joint deformity upper and lower extremities. Good ROM, no contractures. Normal muscle tone in upper extremities-stiffness and lower extremities Skin: no rashes, lesions, ulcers. No induration-marked hair thinning on top of scalp Neurologic: CN 2-12 grossly intact. Sensation intact for extremities, DTR normal for extremities.  Strength 4/5 upper extremities. Strength lower extremities 0/5 Psychiatric: Normal judgment and insight. Alert and oriented x 3. Normal mood.    Labs on Admission: I have personally reviewed following labs and imaging studies  CBC:  Recent Labs Lab 06/02/16 1431 06/02/16 1508  WBC  --  7.9  NEUTROABS  --  5.0  HGB 16.0* 15.1*  HCT 47.0* 45.3  MCV  --  89.5  PLT  --  99991111   Basic Metabolic Panel:  Recent Labs Lab 06/02/16 1430 06/02/16 1431  NA 135 134*  K 4.9 4.6  CL 102 99*  CO2 22  --   GLUCOSE 103* 109*  BUN 11 15  CREATININE 0.68 0.50  CALCIUM 9.5  --    GFR: CrCl cannot be calculated (Unknown ideal weight.). Liver Function Tests:  Recent Labs Lab 06/02/16 1430  AST 32  ALT 22  ALKPHOS 86  BILITOT 1.4*  PROT 6.7  ALBUMIN 4.1   No results for input(s): LIPASE, AMYLASE in the last 168 hours. No results for input(s): AMMONIA in the last 168 hours. Coagulation Profile:  Recent Labs Lab 06/02/16 1502  INR 1.03   Cardiac Enzymes: No results for input(s): CKTOTAL, CKMB, CKMBINDEX, TROPONINI in the last 168 hours. BNP (last 3 results) No results for input(s): PROBNP in the last 8760 hours. HbA1C: No results for input(s): HGBA1C in the last 72 hours. CBG: No results for input(s): GLUCAP in the last 168 hours. Lipid Profile: No results for input(s): CHOL, HDL, LDLCALC, TRIG, CHOLHDL, LDLDIRECT in the last 72 hours. Thyroid Function Tests: No results for input(s): TSH, T4TOTAL, FREET4, T3FREE, THYROIDAB in the last 72 hours. Anemia Panel: No results for input(s): VITAMINB12, FOLATE, FERRITIN, TIBC, IRON, RETICCTPCT in the last 72 hours. Urine analysis:    Component Value Date/Time   COLORURINE YELLOW 09/02/2012 2212   APPEARANCEUR CLOUDY (A) 09/02/2012 2212   LABSPEC 1.016 09/02/2012 2212   PHURINE 5.5 09/02/2012 2212   GLUCOSEU NEGATIVE 09/02/2012 2212   HGBUR MODERATE (A) 09/02/2012 2212   HGBUR negative 03/24/2010 0924   BILIRUBINUR n  04/30/2016 1544   KETONESUR NEGATIVE 09/02/2012 2212   PROTEINUR n 04/30/2016 1544   PROTEINUR 100 (A) 09/02/2012 2212   UROBILINOGEN 0.2 04/30/2016 1544   UROBILINOGEN 0.2 09/02/2012 2212   NITRITE n 04/30/2016 1544   NITRITE NEGATIVE 09/02/2012 2212   LEUKOCYTESUR large (3+) (A) 04/30/2016 1544   Sepsis Labs: @LABRCNTIP (procalcitonin:4,lacticidven:4) )No results found for this or any previous  visit (from the past 240 hour(s)).   Radiological Exams on Admission: Ct Angio Head W Or Wo Contrast  Result Date: 06/02/2016 CLINICAL DATA:  Right arm weakness and slurred speech. Prior meningioma resection. EXAM: CT ANGIOGRAPHY HEAD AND NECK TECHNIQUE: Multidetector CT imaging of the head and neck was performed using the standard protocol during bolus administration of intravenous contrast. Multiplanar CT image reconstructions and MIPs were obtained to evaluate the vascular anatomy. Carotid stenosis measurements (when applicable) are obtained utilizing NASCET criteria, using the distal internal carotid diameter as the denominator. CONTRAST:  50 mL Isovue 370 COMPARISON:  Head and neck CTA 08/26/2012 FINDINGS: CTA NECK FINDINGS Aortic arch: 3 vessel aortic arch with minimal atherosclerosis. Arch vessel origins are widely patent. Right carotid system: Patent with focal calcified plaque in the proximal ICA. No stenosis. Left carotid system: Patent with minimal non stenotic plaque at the carotid bifurcation. Vertebral arteries: Patent without evidence of stenosis. The left vertebral artery is dominant. Both vertebral arteries are somewhat tortuous proximally. Skeleton: Moderately advanced cervical facet arthrosis, greater on the left. Cervical disc degeneration most advanced at C5-6. Other neck: No evidence of acute abnormality or mass. Upper chest: Unremarkable. Review of the MIP images confirms the above findings CTA HEAD FINDINGS Anterior circulation: Internal carotid arteries are widely patent from  skullbase to carotid termini. ACAs and MCAs are patent without evidence of major branch occlusion or significant proximal stenosis. Right A2 segment again noted to be dominant. Mild branch vessel irregularity. No aneurysm identified. Posterior circulation: Intracranial vertebral arteries are patent to the basilar with the left being dominant. Patent PICA, AICA, and SCA origins. Basilar artery is widely patent. Posterior communicating arteries are not identified. PCAs are patent with mild asymmetric attenuation of distal right PCA branch vessels but no evidence of significant proximal stenosis. No aneurysm identified. Venous sinuses: Not well evaluated due to arterial phase contrast timing. Anatomic variants: None of significance. Review of the MIP images confirms the above findings IMPRESSION: Very mild intracranial and extracranial atherosclerosis without evidence of major vessel occlusion or significant stenosis. These results were called by telephone at the time of interpretation on 06/02/2016 at 3:07 pm to Dr. Jaynee Eagles, who verbally acknowledged these results. Electronically Signed   By: Logan Bores M.D.   On: 06/02/2016 15:16   Dg Ribs Unilateral W/chest Right  Result Date: 06/01/2016 CLINICAL DATA:  Fall on Friday, right rib pain EXAM: RIGHT RIBS AND CHEST - 3+ VIEW COMPARISON:  None. FINDINGS: Five views right ribs submitted. There is nondisplaced fracture of the eighth rib. Minimal displaced fracture of the right fifth rib. No pneumothorax. IMPRESSION: There is nondisplaced fracture of the eighth rib. Minimal displaced fracture of the right fifth rib. No pneumothorax. Electronically Signed   By: Lahoma Crocker M.D.   On: 06/01/2016 12:24   Dg Sternum  Result Date: 06/01/2016 CLINICAL DATA:  Fall on Friday, right rib pain EXAM: STERNUM - 2+ VIEW COMPARISON:  None. FINDINGS: There is no evidence of fracture or other focal bone lesions. IMPRESSION: Negative. Electronically Signed   By: Lahoma Crocker M.D.   On:  06/01/2016 12:25   Ct Angio Neck W Or Wo Contrast  Result Date: 06/02/2016 CLINICAL DATA:  Right arm weakness and slurred speech. Prior meningioma resection. EXAM: CT ANGIOGRAPHY HEAD AND NECK TECHNIQUE: Multidetector CT imaging of the head and neck was performed using the standard protocol during bolus administration of intravenous contrast. Multiplanar CT image reconstructions and MIPs were obtained to evaluate the vascular anatomy. Carotid  stenosis measurements (when applicable) are obtained utilizing NASCET criteria, using the distal internal carotid diameter as the denominator. CONTRAST:  50 mL Isovue 370 COMPARISON:  Head and neck CTA 08/26/2012 FINDINGS: CTA NECK FINDINGS Aortic arch: 3 vessel aortic arch with minimal atherosclerosis. Arch vessel origins are widely patent. Right carotid system: Patent with focal calcified plaque in the proximal ICA. No stenosis. Left carotid system: Patent with minimal non stenotic plaque at the carotid bifurcation. Vertebral arteries: Patent without evidence of stenosis. The left vertebral artery is dominant. Both vertebral arteries are somewhat tortuous proximally. Skeleton: Moderately advanced cervical facet arthrosis, greater on the left. Cervical disc degeneration most advanced at C5-6. Other neck: No evidence of acute abnormality or mass. Upper chest: Unremarkable. Review of the MIP images confirms the above findings CTA HEAD FINDINGS Anterior circulation: Internal carotid arteries are widely patent from skullbase to carotid termini. ACAs and MCAs are patent without evidence of major branch occlusion or significant proximal stenosis. Right A2 segment again noted to be dominant. Mild branch vessel irregularity. No aneurysm identified. Posterior circulation: Intracranial vertebral arteries are patent to the basilar with the left being dominant. Patent PICA, AICA, and SCA origins. Basilar artery is widely patent. Posterior communicating arteries are not identified.  PCAs are patent with mild asymmetric attenuation of distal right PCA branch vessels but no evidence of significant proximal stenosis. No aneurysm identified. Venous sinuses: Not well evaluated due to arterial phase contrast timing. Anatomic variants: None of significance. Review of the MIP images confirms the above findings IMPRESSION: Very mild intracranial and extracranial atherosclerosis without evidence of major vessel occlusion or significant stenosis. These results were called by telephone at the time of interpretation on 06/02/2016 at 3:07 pm to Dr. Jaynee Eagles, who verbally acknowledged these results. Electronically Signed   By: Logan Bores M.D.   On: 06/02/2016 15:16   Dg Chest Portable 1 View  Result Date: 06/02/2016 CLINICAL DATA:  Weakness. EXAM: PORTABLE CHEST 1 VIEW COMPARISON:  Radiographs of June 01, 2016. FINDINGS: The heart size and mediastinal contours are within normal limits. Both lungs are clear. No pneumothorax or pleural effusion is noted. Atherosclerosis of thoracic aorta is noted. The visualized skeletal structures are unremarkable. IMPRESSION: Aortic atherosclerosis.  No acute cardiopulmonary abnormality seen. Electronically Signed   By: Marijo Conception, M.D.   On: 06/02/2016 16:35   Ct Head Code Stroke Wo Contrast  Result Date: 06/02/2016 CLINICAL DATA:  Code stroke. Right-sided weakness, slurred speech, and confusion. EXAM: CT HEAD WITHOUT CONTRAST TECHNIQUE: Contiguous axial images were obtained from the base of the skull through the vertex without intravenous contrast. COMPARISON:  05/30/2013 FINDINGS: Brain: Chronic craniectomy changes involving the posterior skull and vertex with wires and clips which extend into the interhemispheric fissure and result in streak artifact, mildly limiting assessment of this region. Chronic encephalomalacia and gliosis in the superior aspects of both cerebral hemispheres are similar to the prior study. A small chronic infarct involving the  anterior right basal ganglia/ white matter adjacent to the frontal horn is unchanged. There is no definite evidence of acute cortical infarct, intracranial hemorrhage, mass, midline shift, or extra-axial fluid collection. Vascular: No hyperdense vessel or unexpected calcification. Skull: Craniectomy changes and unchanged diffuse skull heterogeneity predominantly in the frontal region. Sinuses/Orbits: Unremarkable orbits. Visualized paranasal sinuses and mastoid air cells are clear. Other: None. ASPECTS [facility] Stroke Program Early CT Score) - Ganglionic level infarction (caudate, lentiform nuclei, internal capsule, insula, M1-M3 cortex): 7 - Supraganglionic infarction (M4-M6 cortex): 3 Total score (  0-10 with 10 being normal): 10 IMPRESSION: 1. No evidence of acute intracranial abnormality. 2. ASPECTS is 10. 3. Chronic changes from prior meningioma resection. 4. Chronic right anterior basal ganglia infarct. These results were called by telephone at the time of interpretation on 06/02/2016 at 2:44 pm to Dr. Jaynee Eagles, who verbally acknowledged these results. Electronically Signed   By: Logan Bores M.D.   On: 06/02/2016 14:46    EKG: (Independently reviewed) sinus rhythm with ventricular rate 74 bpm, QTC 448 ms, no acute ischemic changes  Assessment/Plan Principal Problem:   Acute right-sided weakness/history of prior stroke -Patient presents with waxing and waning acute right-sided weakness with facial drooping and dysarthria-currently back to baseline -Appreciate neurology assistance -TIA/ischemic stroke order set initiated -No MRI secondary to intracranial clips from prior meningioma resection -History of similar symptoms in past that were never clearly elucidated as to etiology: ?? TIA/stroke versus atypical seizures -Carotid duplex and echocardiogram -Hemoglobin A1c and lipid panel -Antiplatelet with preadmission Plavix -PT/OT/SLP noting at baseline due to problems from lower extremity paresis from  prior meningioma resection is wheelchair-bound at baseline  Active Problems:   Partial symptomatic epilepsy with complex partial seizures, not intractable, without status epilepticus  -Recent adjustments in AEDs but w/ reported normal therapeutic levels as early as last week -Repeating Trileptal level and continue preadmission Trileptal -Monitor for seizure activity -Defer to neurology as to whether EEG indicated -Uncertain if presenting symptoms more reflective of complex seizure -Of note pt did receive a floroquinolone in mid September for treatment of UTI; this has been at least 3 weeks ago and uncertain if this may have potentiated any seizure activity noting this class of drugs can lower seizure threshold      Recent UTI (urinary tract infection) -Repeat urinalysis and culture to ensure eradication of previous organism especially with presentation of altered mentation and strokelike symptoms    History of resection of meningioma -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 and is wheelchair-bound -Has surgical clips in place so unable to pursue MRIs -Continue preadmission benzodiazepines and skeletal muscular relaxers    Right rib fractures -Continue preadmission Percocet with current pain levels being reported as controlled    Hyperlipidemia -Continue Lipitor    Essential hypertension -Initial blood pressure not well controlled at 172/87 with current blood pressure controlled -Continue preadmission Tenormin      DVT prophylaxis: Lovenox  Code Status: Full  Family Communication: Husband at bedside Disposition Plan: Anticipate discharge back to preadmission home environment once medically stable Consults called: Neurology/[place]    Darriel Utter L. ANP-BC [facility] Pager [phone]   If 7PM-7AM, please contact night-coverage [email or web address] Password [email or web address]  06/02/2016, 4:46 PM

## 2016-06-02 NOTE — Consult Note (Signed)
Requesting Physician: ED MD    Chief Complaint: code stroke  History obtained from:  Patient    HPI:                                                                                                                                         Stacy Diaz is an 65 y.o. female with history of previous meningioma resection with subsequent spastic paraparesis, seizures.  Today at 1445 noted confusion, left facial decreased sensation, right arm weakness. On arrival she had improved symptoms on the right and was not confused. CT of head was obtained and CTA was also obtained both negative for acute event. At her baseline she has trouble angulating for the past 4 years but could bear weight on her legs but needs help with transfers. Patient has a history of seizures and is on Tegretol. She states she fell in the bathroom yesterday but denies decreased LOC or hitting her head. She fractured her ribs and says she is in pain and has been increasingly taken percocet for the rib pain.    Patient has had multiple left-sided episodes of weakness but none on the right. Husband at bedside. She developed left arm weakness and facial droop on the morning of 08/25/2012. She had multiple episodes in the past similar to the episode above but this one had been persistent. MRI was not an option since she had history of no clips to 2 meningioma resection. Since her surgery she has had trouble with weakness from waist down.   Date last known well: Date: 06/02/2016 Time last known well: Time: 14:45 tPA Given: No: resolving symptoms   Past Medical History:  Diagnosis Date  . Anxiety   . Arthritis    "probably" (08/25/2012)  . Breast cancer of lower-outer quadrant of left female breast ([place]) 05/25/2014  . Headache(784.0)    "not real frequent" (08/25/2012)  . Heart disease   . HTN (hypertension)   . Hyperlipidemia   . Meningioma ([place]) 1999  . Myocardial infarction 1987  . Paresis of lower extremity ([place]) 1999    BLE/notes 08/25/2012  . S/P radiation therapy 07/30/14-08/30/13   left breast cancer/50Gy  . Seizures ([place])    last seizure 1 month ago  . Spastic paraparesis    S/P meningioma resection 1999/notes 08/25/2012  . Stroke [facility]) 08/25/2012   "that's what they think I've had; sudden LUE weakness" (08/25/2012)    Past Surgical History:  Procedure Laterality Date  . BRAIN MENINGIOMA EXCISION  1999  . BREAST LUMPECTOMY WITH NEEDLE LOCALIZATION AND AXILLARY SENTINEL LYMPH NODE BX Left 06/22/2014   Procedure: LEFT WIRE LOCALIZED LUMPECTOPMY AND SENTINEL NODE MAPPING;  Surgeon: Autumn Messing III, MD;  Location: [facility];  Service: General;  Laterality: Left;  . CARDIAC CATHETERIZATION  1987  . CORONARY ANGIOPLASTY  1987   denied intervention: "blockage wasn't bad enough"    Family History  Problem Relation Age of Onset  . Breast cancer Mother 61    unilateral  . Heart disease Father   . Breast cancer Paternal Grandmother 66    unilateral   Social History:  reports that she has quit smoking. Her smoking use included Cigarettes. She has a 20.00 pack-year smoking history. She has never used smokeless tobacco. She reports that she drinks about 4.2 oz of alcohol per week . She reports that she does not use drugs.  Allergies: No Known Allergies  Medications:                                                                                                                           Current Facility-Administered Medications  Medication Dose Route Frequency Provider Last Rate Last Dose  . [START ON 06/03/2016] clopidogrel (PLAVIX) tablet 75 mg  75 mg Oral Q breakfast Samella Parr, NP      . Oxcarbazepine (TRILEPTAL) tablet 300 mg  300 mg Oral BID Samella Parr, NP      . oxyCODONE-acetaminophen (PERCOCET/ROXICET) 5-325 MG per tablet 2 tablet  2 tablet Oral TID Samella Parr, NP   2 tablet at 06/02/16 1741   Current Outpatient Prescriptions  Medication Sig Dispense Refill  . atenolol (TENORMIN) 50 MG tablet  1 tablet in the morning....... half a tab at bedtime (Patient taking differently: Take 25-50 mg by mouth See admin instructions. 1 tablet in the morning....... half a tab at bedtime) 150 tablet 3  . atorvastatin (LIPITOR) 10 MG tablet Take 1 tablet (10 mg total) by mouth daily. (Patient taking differently: Take 10 mg by mouth every morning. ) 90 tablet 3  . baclofen (LIORESAL) 20 MG tablet TAKE 1 TABLET (20 MG TOTAL) BY MOUTH 4 (FOUR) TIMES DAILY. 360 tablet 2  . clopidogrel (PLAVIX) 75 MG tablet Take 1 tablet (75 mg total) by mouth daily with breakfast. 90 tablet 3  . diazepam (VALIUM) 5 MG tablet TAKE 1 TABLET BY MOUTH AT BEDTIME (Patient taking differently: Take 5 mg by mouth at bedtime) 90 tablet 1  . docusate sodium (COLACE) 100 MG capsule Take 100 mg by mouth 2 (two) times daily.    Marland Kitchen letrozole (FEMARA) 2.5 MG tablet Take 1 tablet (2.5 mg total) by mouth daily. (Patient taking differently: Take 2.5 mg by mouth at bedtime. ) 90 tablet 3  . LORazepam (ATIVAN) 1 MG tablet Take 1 tablet (1 mg total) by mouth 2 (two) times daily as needed. for anxiety (Patient taking differently: Take 1 mg by mouth 2 (two) times daily. for anxiety) 180 tablet 0  . methocarbamol (ROBAXIN) 500 MG tablet Take 1 tablet (500 mg total) by mouth 3 (three) times daily. 300 tablet 3  . Multiple Vitamin (MULTIVITAMIN WITH MINERALS) TABS tablet Take 1 tablet by mouth every morning.     Marland Kitchen OVER THE COUNTER MEDICATION Apply 1 application topically daily as needed (pain). Aspercreme with 5% lidocaine    . Oxcarbazepine (TRILEPTAL)  300 MG tablet Take 0.5 tablets (150 mg total) by mouth 2 (two) times daily. Start with 1/2 tablet twice daily x 2 weeks then one tablet twice daily (Patient taking differently: Take 300 mg by mouth 2 (two) times daily. ) 180 tablet 3  . oxyCODONE-acetaminophen (PERCOCET/ROXICET) 5-325 MG tablet Take 2 tablets by mouth 3 (three) times daily. 540 tablet 0  . polyethylene glycol (MIRALAX / GLYCOLAX) packet  Take 17 g by mouth daily as needed (for constipation). Mix with liquid and drink    . zinc oxide (BALMEX) 11.3 % CREA cream Apply 1 application topically at bedtime as needed (IRRITATION).     . ciprofloxacin (CIPRO) 500 MG tablet Take 1 tablet (500 mg total) by mouth 2 (two) times daily. (Patient not taking: Reported on 06/02/2016) 10 tablet 0  . fluconazole (DIFLUCAN) 150 MG tablet Take 1 tablet (150 mg total) by mouth daily. (Patient not taking: Reported on 06/02/2016) 2 tablet 0  . Melatonin 3 MG TABS Take 1 tablet (3 mg total) by mouth at bedtime. (Patient not taking: Reported on 06/02/2016) 90 tablet 3     ROS:                                                                                                                                       History obtained from the patient  General ROS: negative for - chills, fatigue, fever, night sweats, weight gain or weight loss Psychological ROS: negative for - behavioral disorder, hallucinations, memory difficulties, mood swings or suicidal ideation Ophthalmic ROS: negative for - blurry vision, double vision, eye pain or loss of vision ENT ROS: negative for - epistaxis, nasal discharge, oral lesions, sore throat, tinnitus or vertigo Allergy and Immunology ROS: negative for - hives or itchy/watery eyes Hematological and Lymphatic ROS: negative for - bleeding problems, bruising or swollen lymph nodes Endocrine ROS: negative for - galactorrhea, hair pattern changes, polydipsia/polyuria or temperature intolerance Respiratory ROS: negative for - cough, hemoptysis, shortness of breath or wheezing Cardiovascular ROS: negative for - chest pain, dyspnea on exertion, edema or irregular heartbeat Gastrointestinal ROS: negative for - abdominal pain, diarrhea, hematemesis, nausea/vomiting or stool incontinence Genito-Urinary ROS: negative for - dysuria, hematuria, incontinence or urinary frequency/urgency Musculoskeletal ROS: negative for - joint swelling or  muscular weakness Neurological ROS: as noted in HPI Dermatological ROS: negative for rash and skin lesion changes  Neurologic Examination:  Blood pressure 142/73, pulse 68, temperature 98.1 F (36.7 C), resp. rate (!) 9, SpO2 100 %.  HEENT-  Normocephalic, no lesions, without obvious abnormality.  Normal external eye and conjunctiva.  Normal TM's bilaterally.  Normal auditory canals and external ears. Normal external nose, mucus membranes and septum.  Normal pharynx. Cardiovascular- S1, S2 normal, pulses palpable throughout   Lungs- chest clear, no wheezing, rales, normal symmetric air entry Abdomen- normal findings: bowel sounds normal Extremities- no edema Lymph-no adenopathy palpable Musculoskeletal-no joint tenderness, deformity or swelling Skin-warm and dry, no hyperpigmentation, vitiligo, or suspicious lesions  Neurological Examination Mental Status: Alert, oriented, thought content appropriate.  Speech fluent without evidence of aphasia at times slow to respond.   Able to follow 3 step commands without difficulty. Cranial nerves: Visual fields grossly normal, pupils equal, round, reactive to light and accommodation III,IV, VI: ptosis not present, extra-ocular motions intact bilaterally V,VII: smile symmetric, facial light touch sensation normal bilaterally VIII: hearing normal bilaterally IX,X: uvula rises symmetrically XI: bilateral shoulder shrug XII: midline tongue extension Motor: Right : Upper extremity   4/5    Left:     Upper extremity   4/5  Lower extremity   1/5     Lower extremity   1/5 --pronation drift on the right, significant increased tone bilateral lower extremities with inability to bend knees. Tone and bulk:normal tone throughout; no atrophy noted Sensory: Pinprick and light touch decreased on the left face and arm Deep Tendon Reflexes: 2+ and symmetric  throughout upper extremities, no knee jerk secondary to increased tone, no ankle jerks. Plantars: Bilateral upgoing toes Cerebellar: normal finger-to-nose,  Gait: Not tested       Lab Results: Basic Metabolic Panel:  Recent Labs Lab 06/02/16 1430 06/02/16 1431  NA 135 134*  K 4.9 4.6  CL 102 99*  CO2 22  --   GLUCOSE 103* 109*  BUN 11 15  CREATININE 0.68 0.50  CALCIUM 9.5  --     Liver Function Tests:  Recent Labs Lab 06/02/16 1430  AST 32  ALT 22  ALKPHOS 86  BILITOT 1.4*  PROT 6.7  ALBUMIN 4.1   No results for input(s): LIPASE, AMYLASE in the last 168 hours. No results for input(s): AMMONIA in the last 168 hours.  CBC:  Recent Labs Lab 06/02/16 1431 06/02/16 1508  WBC  --  7.9  NEUTROABS  --  5.0  HGB 16.0* 15.1*  HCT 47.0* 45.3  MCV  --  89.5  PLT  --  205    Cardiac Enzymes: No results for input(s): CKTOTAL, CKMB, CKMBINDEX, TROPONINI in the last 168 hours.  Lipid Panel: No results for input(s): CHOL, TRIG, HDL, CHOLHDL, VLDL, LDLCALC in the last 168 hours.  CBG: No results for input(s): GLUCAP in the last 168 hours.  Microbiology: Results for orders placed or performed in visit on 04/30/16  Urine culture     Status: None   Collection Time: 04/30/16  4:19 PM  Result Value Ref Range Status   Culture ESCHERICHIA COLI  Final   Colony Count >=100,000 COLONIES/ML  Final   Organism ID, Bacteria ESCHERICHIA COLI  Final      Susceptibility   Escherichia coli -  (no method available)    AMPICILLIN <=2 Sensitive     AMOX/CLAVULANIC <=2 Sensitive     AMPICILLIN/SULBACTAM <=2 Sensitive     PIP/TAZO <=4 Sensitive     IMIPENEM <=0.25 Sensitive     CEFAZOLIN <=4 Not Reportable  CEFTRIAXONE <=1 Sensitive     CEFTAZIDIME <=1 Sensitive     CEFEPIME <=1 Sensitive     GENTAMICIN <=1 Sensitive     TOBRAMYCIN <=1 Sensitive     CIPROFLOXACIN <=0.25 Sensitive     LEVOFLOXACIN <=0.12 Sensitive     NITROFURANTOIN <=16 Sensitive     TRIMETH/SULFA*  <=20 Sensitive      * NR=NOT REPORTABLE,SEE COMMENTORAL therapy:A cefazolin MIC of <32 predicts susceptibility to the oral agents cefaclor,cefdinir,cefpodoxime,cefprozil,cefuroxime,cephalexin,and loracarbef when used for therapy of uncomplicated UTIs due to E.coli,K.pneumomiae,and P.mirabilis. PARENTERAL therapy: A cefazolinMIC of >8 indicates resistance to parenteralcefazolin. An alternate test method must beperformed to confirm susceptibility to parenteralcefazolin.    Coagulation Studies:  Recent Labs  06/02/16 1502  LABPROT 13.5  INR 1.03    Imaging: Ct Angio Head W Or Wo Contrast  Result Date: 06/02/2016 CLINICAL DATA:  Right arm weakness and slurred speech. Prior meningioma resection. EXAM: CT ANGIOGRAPHY HEAD AND NECK TECHNIQUE: Multidetector CT imaging of the head and neck was performed using the standard protocol during bolus administration of intravenous contrast. Multiplanar CT image reconstructions and MIPs were obtained to evaluate the vascular anatomy. Carotid stenosis measurements (when applicable) are obtained utilizing NASCET criteria, using the distal internal carotid diameter as the denominator. CONTRAST:  50 mL Isovue 370 COMPARISON:  Head and neck CTA 08/26/2012 FINDINGS: CTA NECK FINDINGS Aortic arch: 3 vessel aortic arch with minimal atherosclerosis. Arch vessel origins are widely patent. Right carotid system: Patent with focal calcified plaque in the proximal ICA. No stenosis. Left carotid system: Patent with minimal non stenotic plaque at the carotid bifurcation. Vertebral arteries: Patent without evidence of stenosis. The left vertebral artery is dominant. Both vertebral arteries are somewhat tortuous proximally. Skeleton: Moderately advanced cervical facet arthrosis, greater on the left. Cervical disc degeneration most advanced at C5-6. Other neck: No evidence of acute abnormality or mass. Upper chest: Unremarkable. Review of the MIP images confirms the above findings CTA  HEAD FINDINGS Anterior circulation: Internal carotid arteries are widely patent from skullbase to carotid termini. ACAs and MCAs are patent without evidence of major branch occlusion or significant proximal stenosis. Right A2 segment again noted to be dominant. Mild branch vessel irregularity. No aneurysm identified. Posterior circulation: Intracranial vertebral arteries are patent to the basilar with the left being dominant. Patent PICA, AICA, and SCA origins. Basilar artery is widely patent. Posterior communicating arteries are not identified. PCAs are patent with mild asymmetric attenuation of distal right PCA branch vessels but no evidence of significant proximal stenosis. No aneurysm identified. Venous sinuses: Not well evaluated due to arterial phase contrast timing. Anatomic variants: None of significance. Review of the MIP images confirms the above findings IMPRESSION: Very mild intracranial and extracranial atherosclerosis without evidence of major vessel occlusion or significant stenosis. These results were called by telephone at the time of interpretation on 06/02/2016 at 3:07 pm to Dr. Jaynee Eagles, who verbally acknowledged these results. Electronically Signed   By: Logan Bores M.D.   On: 06/02/2016 15:16   Dg Ribs Unilateral W/chest Right  Result Date: 06/01/2016 CLINICAL DATA:  Fall on Friday, right rib pain EXAM: RIGHT RIBS AND CHEST - 3+ VIEW COMPARISON:  None. FINDINGS: Five views right ribs submitted. There is nondisplaced fracture of the eighth rib. Minimal displaced fracture of the right fifth rib. No pneumothorax. IMPRESSION: There is nondisplaced fracture of the eighth rib. Minimal displaced fracture of the right fifth rib. No pneumothorax. Electronically Signed   By: Orlean Bradford.D.  On: 06/01/2016 12:24   Dg Sternum  Result Date: 06/01/2016 CLINICAL DATA:  Fall on Friday, right rib pain EXAM: STERNUM - 2+ VIEW COMPARISON:  None. FINDINGS: There is no evidence of fracture or other focal  bone lesions. IMPRESSION: Negative. Electronically Signed   By: Lahoma Crocker M.D.   On: 06/01/2016 12:25   Ct Angio Neck W Or Wo Contrast  Result Date: 06/02/2016 CLINICAL DATA:  Right arm weakness and slurred speech. Prior meningioma resection. EXAM: CT ANGIOGRAPHY HEAD AND NECK TECHNIQUE: Multidetector CT imaging of the head and neck was performed using the standard protocol during bolus administration of intravenous contrast. Multiplanar CT image reconstructions and MIPs were obtained to evaluate the vascular anatomy. Carotid stenosis measurements (when applicable) are obtained utilizing NASCET criteria, using the distal internal carotid diameter as the denominator. CONTRAST:  50 mL Isovue 370 COMPARISON:  Head and neck CTA 08/26/2012 FINDINGS: CTA NECK FINDINGS Aortic arch: 3 vessel aortic arch with minimal atherosclerosis. Arch vessel origins are widely patent. Right carotid system: Patent with focal calcified plaque in the proximal ICA. No stenosis. Left carotid system: Patent with minimal non stenotic plaque at the carotid bifurcation. Vertebral arteries: Patent without evidence of stenosis. The left vertebral artery is dominant. Both vertebral arteries are somewhat tortuous proximally. Skeleton: Moderately advanced cervical facet arthrosis, greater on the left. Cervical disc degeneration most advanced at C5-6. Other neck: No evidence of acute abnormality or mass. Upper chest: Unremarkable. Review of the MIP images confirms the above findings CTA HEAD FINDINGS Anterior circulation: Internal carotid arteries are widely patent from skullbase to carotid termini. ACAs and MCAs are patent without evidence of major branch occlusion or significant proximal stenosis. Right A2 segment again noted to be dominant. Mild branch vessel irregularity. No aneurysm identified. Posterior circulation: Intracranial vertebral arteries are patent to the basilar with the left being dominant. Patent PICA, AICA, and SCA origins.  Basilar artery is widely patent. Posterior communicating arteries are not identified. PCAs are patent with mild asymmetric attenuation of distal right PCA branch vessels but no evidence of significant proximal stenosis. No aneurysm identified. Venous sinuses: Not well evaluated due to arterial phase contrast timing. Anatomic variants: None of significance. Review of the MIP images confirms the above findings IMPRESSION: Very mild intracranial and extracranial atherosclerosis without evidence of major vessel occlusion or significant stenosis. These results were called by telephone at the time of interpretation on 06/02/2016 at 3:07 pm to Dr. Jaynee Eagles, who verbally acknowledged these results. Electronically Signed   By: Logan Bores M.D.   On: 06/02/2016 15:16   Dg Chest Portable 1 View  Result Date: 06/02/2016 CLINICAL DATA:  Weakness. EXAM: PORTABLE CHEST 1 VIEW COMPARISON:  Radiographs of June 01, 2016. FINDINGS: The heart size and mediastinal contours are within normal limits. Both lungs are clear. No pneumothorax or pleural effusion is noted. Atherosclerosis of thoracic aorta is noted. The visualized skeletal structures are unremarkable. IMPRESSION: Aortic atherosclerosis.  No acute cardiopulmonary abnormality seen. Electronically Signed   By: Marijo Conception, M.D.   On: 06/02/2016 16:35   Ct Head Code Stroke Wo Contrast  Result Date: 06/02/2016 CLINICAL DATA:  Code stroke. Right-sided weakness, slurred speech, and confusion. EXAM: CT HEAD WITHOUT CONTRAST TECHNIQUE: Contiguous axial images were obtained from the base of the skull through the vertex without intravenous contrast. COMPARISON:  05/30/2013 FINDINGS: Brain: Chronic craniectomy changes involving the posterior skull and vertex with wires and clips which extend into the interhemispheric fissure and result in streak artifact, mildly  limiting assessment of this region. Chronic encephalomalacia and gliosis in the superior aspects of both cerebral  hemispheres are similar to the prior study. A small chronic infarct involving the anterior right basal ganglia/ white matter adjacent to the frontal horn is unchanged. There is no definite evidence of acute cortical infarct, intracranial hemorrhage, mass, midline shift, or extra-axial fluid collection. Vascular: No hyperdense vessel or unexpected calcification. Skull: Craniectomy changes and unchanged diffuse skull heterogeneity predominantly in the frontal region. Sinuses/Orbits: Unremarkable orbits. Visualized paranasal sinuses and mastoid air cells are clear. Other: None. ASPECTS [facility] Stroke Program Early CT Score) - Ganglionic level infarction (caudate, lentiform nuclei, internal capsule, insula, M1-M3 cortex): 7 - Supraganglionic infarction (M4-M6 cortex): 3 Total score (0-10 with 10 being normal): 10 IMPRESSION: 1. No evidence of acute intracranial abnormality. 2. ASPECTS is 10. 3. Chronic changes from prior meningioma resection. 4. Chronic right anterior basal ganglia infarct. These results were called by telephone at the time of interpretation on 06/02/2016 at 2:44 pm to Dr. Jaynee Eagles, who verbally acknowledged these results. Electronically Signed   By: Logan Bores M.D.   On: 06/02/2016 14:46       Assessment and plan discussed with with attending physician and they are in agreement.    Etta Quill PA-C [facility] [phone]  06/02/2016, 6:48 PM   Assessment: 65 y.o. female with significant past medical history of right meningioma resection, seizures who presented with transient confusion, right arm weakness and left facial decreased sensation. As the consultation continued patient was noting more left facial numbness left arm numbness but confusion and weakness improved. Patient was not a TPA candidate secondary to minimal symptoms.  Stroke Risk Factors - hyperlipidemia and hypertension  Recommend: 1. HgbA1c, 2. Unable to obtain MRI secondary to history of intracranial  surgery with clippings 3. PT consult, OT consult, Speech consult 4. Echocardiogram 5. No need to obtain Dopplers secondary to CTA of head and neck obtained in the ED 6. Prophylactic therapy-Antiplatelet med: Plavix - dose 75 mg daily 7. Risk factor modification 8. Telemetry monitoring 9. Frequent neuro checks 11 will obtain Trileptal level 12. Workup for other metabolic abnormalities that might cause confusion or recrudescence  of previous symptoms 10 NPO until passes stroke swallow screen 11 please page stroke NP  Or  PA  Or MD from 8am -4 pm  as this patient from this time will be  followed by the stroke.   You can look them up on [email or web address]  Password [email or web address]    Personally examined patient and images, and have participated in and made any corrections needed to history, physical, neuro exam,assessment and plan as stated above.  I have personally obtained the history, evaluated lab date, reviewed imaging studies and agree with radiology interpretations.    Sarina Ill, MD [facility] [phone]

## 2016-06-02 NOTE — ED Notes (Signed)
RN at bedside. Patient crying b/c her ribs hurt. Shortly after pt reported she can't move her right arm. Dr. Alvino Chapel to bedside. Janett Billow, Rn made aware is coming back down.

## 2016-06-02 NOTE — ED Notes (Signed)
Patient comes from right sided weakness and decreased right arm sensation and movement.  Initial NIH was 9.  Code Stroke canceled at 1446.  At 1515 patient called out saying she was unable to move right arm again and was flaccid in that side right arm.  Symptoms have resolved at approximately 1540 when neuro came to see patient again.  PASSED SWALLOW SCREEN.  Patient has history of skull surgery and had some meningies removed.  Patient is paralyzed from the waist down.  Scores 4 on left and right leg on NIH.  Patient has some fractured ribs on right side.  Patient was cathed for urine but could not produce sample.  Water given to try to make urine.  Bladder scan was 80.  Call [phone] for questions

## 2016-06-02 NOTE — Code Documentation (Signed)
Pt arrived from home via [facility]. Patient's husband noticed an acute onset of right sided weakness, dysarthria and confusion. Code Stroke called. Upon assessment at the bridge in [facility], the patient c/o right sided weakness and confusion. Patient taken to CT. NIHSS scored 9. 4's were scored for her bilateral lower extremities which is her baseline. Also scored 1 for mild sensory deficit to her LUE and facial numbness. No right sided issues were appreciated. CTA performed. Code stroke canceled per Dr. Jaynee Eagles. Bedside handoff to ED RN Judson Roch.

## 2016-06-02 NOTE — ED Triage Notes (Addendum)
Per ems- Pt code stroke LSN at 1340 when he husband noticed confusion, right sided arm weakness. Has hx of CVA in 2016, meningoma resection, has LE paralysis. Pt arrival with resolved confusion, reports she is feeling better. Pt had a fall recently with rib fractures.

## 2016-06-02 NOTE — ED Notes (Signed)
This RN and EMT attempted to collect urine sample.  In and out cath preformed with no urine return.  Bladder scan done with 80 mL of urine detected.  Patient given water to try to make urine.  Will continue to monitor.

## 2016-06-02 NOTE — ED Notes (Signed)
Stacy Diaz, Stroke RN reports we can cancel code stroke.

## 2016-06-03 ENCOUNTER — Observation Stay (HOSPITAL_BASED_OUTPATIENT_CLINIC_OR_DEPARTMENT_OTHER): Payer: PPO

## 2016-06-03 ENCOUNTER — Observation Stay (HOSPITAL_BASED_OUTPATIENT_CLINIC_OR_DEPARTMENT_OTHER)
Admit: 2016-06-03 | Discharge: 2016-06-03 | Disposition: A | Discharge status: Home / Self Care | Payer: PPO | Attending: Nurse Practitioner | Admitting: Nurse Practitioner

## 2016-06-03 DIAGNOSIS — Z9889 Other specified postprocedural states: Secondary | ICD-10-CM | POA: Diagnosis not present

## 2016-06-03 DIAGNOSIS — G40209 Localization-related (focal) (partial) symptomatic epilepsy and epileptic syndromes with complex partial seizures, not intractable, without status epilepticus: Secondary | ICD-10-CM | POA: Diagnosis not present

## 2016-06-03 DIAGNOSIS — R531 Weakness: Secondary | ICD-10-CM

## 2016-06-03 DIAGNOSIS — Z8673 Personal history of transient ischemic attack (TIA), and cerebral infarction without residual deficits: Secondary | ICD-10-CM | POA: Diagnosis not present

## 2016-06-03 DIAGNOSIS — M6289 Other specified disorders of muscle: Secondary | ICD-10-CM | POA: Diagnosis not present

## 2016-06-03 DIAGNOSIS — I10 Essential (primary) hypertension: Secondary | ICD-10-CM | POA: Diagnosis not present

## 2016-06-03 LAB — CBC WITH DIFFERENTIAL/PLATELET
BASOS PCT: 0 %
Basophils Absolute: 0 10*3/uL (ref 0.0–0.1)
EOS ABS: 0 10*3/uL (ref 0.0–0.7)
EOS PCT: 1 %
HCT: 42.6 % (ref 36.0–46.0)
HEMOGLOBIN: 14.2 g/dL (ref 12.0–15.0)
LYMPHS ABS: 2.5 10*3/uL (ref 0.7–4.0)
Lymphocytes Relative: 38 %
MCH: 29.6 pg (ref 26.0–34.0)
MCHC: 33.3 g/dL (ref 30.0–36.0)
MCV: 88.9 fL (ref 78.0–100.0)
MONO ABS: 0.7 10*3/uL (ref 0.1–1.0)
MONOS PCT: 10 %
NEUTROS PCT: 51 %
Neutro Abs: 3.4 10*3/uL (ref 1.7–7.7)
PLATELETS: 197 10*3/uL (ref 150–400)
RBC: 4.79 MIL/uL (ref 3.87–5.11)
RDW: 13 % (ref 11.5–15.5)
WBC: 6.7 10*3/uL (ref 4.0–10.5)

## 2016-06-03 LAB — LIPID PANEL
CHOLESTEROL: 154 mg/dL (ref 0–200)
HDL: 55 mg/dL (ref >40)
LDL Cholesterol: 79 mg/dL (ref 0–99)
TRIGLYCERIDES: 99 mg/dL (ref <150)
Total CHOL/HDL Ratio: 2.8 RATIO
VLDL: 20 mg/dL (ref 0–40)

## 2016-06-03 LAB — BASIC METABOLIC PANEL
Anion gap: 11 (ref 5–15)
BUN: 8 mg/dL (ref 6–20)
CALCIUM: 9.4 mg/dL (ref 8.9–10.3)
CO2: 22 mmol/L (ref 22–32)
CREATININE: 0.49 mg/dL (ref 0.44–1.00)
Chloride: 101 mmol/L (ref 101–111)
GFR calc non Af Amer: >60 mL/min (ref >60)
Glucose, Bld: 89 mg/dL (ref 65–99)
Potassium: 3.8 mmol/L (ref 3.5–5.1)
SODIUM: 134 mmol/L — AB (ref 135–145)

## 2016-06-03 MED ORDER — OXYCODONE-ACETAMINOPHEN 5-325 MG PO TABS
2.0000 | ORAL_TABLET | Freq: Once | ORAL | Status: AC
  Administered 2016-06-03: 2 via ORAL
  Filled 2016-06-03: qty 2

## 2016-06-03 MED ORDER — WHITE PETROLATUM GEL
Status: AC
  Filled 2016-06-03: qty 1

## 2016-06-03 NOTE — Progress Notes (Signed)
Pt quite uncomfortable requesting pain medication/muscle relaxant; on call paged [phone]. Return call [phone] K. Schorr, NP placing one time order percocet 2 tab.  Talked with patient to discuss with MD tomorrow for better regimen for medications to better control pain.  Continue to monitor patient.

## 2016-06-03 NOTE — Progress Notes (Signed)
EEG Completed; Results Pending  

## 2016-06-03 NOTE — Progress Notes (Signed)
*  PRELIMINARY RESULTS* Vascular Ultrasound Carotid Duplex (Doppler) has been completed.   There is no obvious evidence of hemodynamically significant internal carotid artery stenosis bilaterally. Vertebral arteries are patent with antegrade flow.  06/03/2016 12:54 PM Maudry Mayhew, BS, RVT, RDCS, RDMS

## 2016-06-03 NOTE — Evaluation (Signed)
Physical Therapy Evaluation Patient Details Name: Stacy Diaz MRN: [id number] DOB: February 22, 1951 Today's Date: 06/03/2016   History of Present Illness  65 y.o. female with medical history significant for meningioma resection in 1998 resulting in bilateral lower extremity paralysis, history of strokelike symptoms without definitive diagnosis in 2014, apparent formal diagnosis of CVA in 2016, seizure disorder with apparent recurrent seizures multiple adjustments in AEDs over the years, history of breast cancer, lipidemia, hypertension and recent treatment for pansensitive Escherichia coli UTI last month. Of note patient fell out of her wheelchair recently an x-ray performed yesterday revealed 2 right rib fractures. On 10/17 pt developed abrupt onset of right-sided facial drooping and right-sided weakness w/ dysarthria. CT on 10/17 negative for acute infarct.   Clinical Impression  Pt maxA for transfers and ADLs PTA due to bilat LE paraplegia. Pt now even more limited due to R rib fractures and pain with R UE movement during transfers. Pt and spouse with good set up at home and can utilize spin disc or hoyer lift for safe transfers. Pt to benefit from HHPT to improve strength to decrease burden on spouse during transfers.    Follow Up Recommendations Home health PT;Supervision/Assistance - 24 hour;Supervision for mobility/OOB    Equipment Recommendations  None recommended by PT    Recommendations for Other Services       Precautions / Restrictions Precautions Precautions: Fall Precaution Comments: bil AFOs Required Braces or Orthoses:  (dependent to don) Restrictions Weight Bearing Restrictions: No      Mobility  Bed Mobility Overal bed mobility: Needs Assistance;+2 for physical assistance Bed Mobility: Supine to Sit     Supine to sit: Max assist;+2 for physical assistance;HOB elevated     General bed mobility comments: Assist for bil LEs to EOB and for trunk elevation. Pt  minimally assisting with UEs.  Transfers Overall transfer level: Needs assistance Equipment used: 2 person hand held assist Transfers: Sit to/from [facility] Sit to Stand: Max assist;+2 physical assistance Stand pivot transfers: Max assist;+2 physical assistance       General transfer comment: With use of bed pad for sit to stand. Bil knees and feet blocked.  Ambulation/Gait             General Gait Details: non ambulatory  Stairs            Wheelchair Mobility    Modified Rankin (Stroke Patients Only) Modified Rankin (Stroke Patients Only) Pre-Morbid Rankin Score: Severe disability Modified Rankin: Severe disability     Balance Overall balance assessment: Needs assistance Sitting-balance support: Feet supported Sitting balance-Leahy Scale: Poor Sitting balance - Comments: R and posterior lean in sitting. Difficulty releasing UEs and maintaining support. Postural control: Posterior lean Standing balance support: Bilateral upper extremity supported Standing balance-Leahy Scale: Zero                               Pertinent Vitals/Pain Pain Assessment: 0-10 Pain Score: 8  Pain Location: all over Pain Descriptors / Indicators: Sharp;Grimacing Pain Intervention(s): Monitored during session    Home Living Family/patient expects to be discharged to:: Private residence Living Arrangements: Spouse/significant other Available Help at Discharge: Family;Personal care attendant Type of Home: House Home Access: Other (comment) (lift for entry into house)     Home Layout: One level Home Equipment: Shower seat;Grab bars - toilet;Grab bars - tub/shower;Wheelchair - power;Hospital bed Additional Comments: caregiver 3 days per week for 2 hours  Prior Function Level of Independence: Needs assistance   Gait / Transfers Assistance Needed: spin disc for all transfers, husband assists. Mobility at wheelchair level.  ADL's / Homemaking  Assistance Needed: caregiver/husband assists with bathing and dressing. Needs assist with LB and fastening bra        Hand Dominance   Dominant Hand: Right    Extremity/Trunk Assessment   Upper Extremity Assessment: Defer to OT evaluation           Lower Extremity Assessment:  (pt with strong extensor tone, paraplegic, no active mvmt)      Cervical / Trunk Assessment: Kyphotic  Communication   Communication: No difficulties  Cognition Arousal/Alertness: Awake/alert Behavior During Therapy: WFL for tasks assessed/performed Overall Cognitive Status: Within Functional Limits for tasks assessed                      General Comments      Exercises     Assessment/Plan    PT Assessment Patient needs continued PT services  PT Problem List Decreased strength;Decreased range of motion;Decreased activity tolerance;Decreased balance;Decreased mobility          PT Treatment Interventions DME instruction;Gait training;Stair training;Functional mobility training;Therapeutic activities;Therapeutic exercise;Balance training    PT Goals (Current goals can be found in the Care Plan section)  Acute Rehab PT Goals Patient Stated Goal: return home PT Goal Formulation: With patient Time For Goal Achievement: 06/17/16 Potential to Achieve Goals: Good    Frequency Min 3X/week   Barriers to discharge        Co-evaluation PT/OT/SLP Co-Evaluation/Treatment: Yes Reason for Co-Treatment: Complexity of the patient's impairments (multi-system involvement) PT goals addressed during session: Mobility/safety with mobility         End of Session   Activity Tolerance: Patient tolerated treatment well Patient left: in chair;with call bell/phone within reach;with chair alarm set;with family/visitor present Nurse Communication: Mobility status;Need for lift equipment (use steady)    Functional Assessment Tool Used: clinical judgement Functional Limitation: Mobility: Walking  and moving around Mobility: Walking and Moving Around Current Status [phone]): At least 60 percent but less than 80 percent impaired, limited or restricted Mobility: Walking and Moving Around Goal Status [phone]): At least 40 percent but less than 60 percent impaired, limited or restricted    Time: 0935-1019 PT Time Calculation (min) (ACUTE ONLY): 44 min   Charges:   PT Evaluation $PT Eval High Complexity: 1 Procedure PT Treatments $Therapeutic Activity: 8-22 mins   PT G Codes:   PT G-Codes **NOT FOR INPATIENT CLASS** Functional Assessment Tool Used: clinical judgement Functional Limitation: Mobility: Walking and moving around Mobility: Walking and Moving Around Current Status [id number]): At least 60 percent but less than 80 percent impaired, limited or restricted Mobility: Walking and Moving Around Goal Status [phone]): At least 40 percent but less than 60 percent impaired, limited or restricted    Kingsley Callander 06/03/2016, 2:21 PM   Kittie Plater, PT, DPT Pager #: [phone] Office #: [phone]

## 2016-06-03 NOTE — Progress Notes (Signed)
PROGRESS NOTE    Stacy Diaz  [id number] DOB: 1951/02/20 DOA: 06/02/2016 PCP: Joycelyn Man, MD    Brief Narrative:  Stacy Diaz is a 65 y.o. female with medical history significant for meningioma resection in 1998 resulting in bilateral lower extremity paralysis, history of strokelike symptoms without definitive diagnosis in 2014, apparent formal diagnosis of CVA in 2016, seizure disorder with apparent recurrent seizures multiple adjustments in AEDs over the years, history of breast cancer, lipidemia, hypertension and recent treatment for pansensitive Escherichia coli UTI last month. Of note patient fell out of her wheelchair recently an x-ray performed yesterday revealed 2 right rib fractures.  Patient was in her usual state of health and actually had a friend over this morning and was doing well until about 11:30 AM when she developed abrupt onset of right-sided facial drooping and right-sided weakness w/ dysarthria. EMS was called and patient was brought to the ER. A Code stroke called but since symptoms had resolved upon arrival the code stroke was canceled. Several hours later with the patient still in the ER her symptoms reemerged. Not a candidate for TPA due to mild severity of symptoms. Initial CT head negative. Not a candidate for MRI secondary to intracranial clips from prior meningioma resection. Neurology following.  In discussion with the patient and her husband patient reports had done well post resection and had been stable on Tegretol for many years. In 2013 she followed up with neurosurgery who told her she was stable and did not need to return. She eventually followed up with neurology who weaned and discontinued her Tegretol. Subsequently thereafter in 2014 she had a presentation with left-sided hemiparesis that lasted 2-3 days. She was sent to a skilled nursing facility but had to return 2 days later to the ER for worsening symptoms and remained in the hospital  5 days. Since that time she has had various adjustments in her antiepileptic drugs and she is primarily followed by Dr. Leonie Man. During that admission in 2014 a definitive diagnosis was never declared with husband stating that the neurologist felt either TIA/CVA or atypical seizures could be culprit. Since that time the patient has apparently had breakthrough seizures and has had adjustments in her anti-epileptic medications. Most recently she was changed from Vimpat to Trileptal. Of note she takes Ativan prn as well as nocturnal Valium. In mid-September she was treated with Cipro for a pansensitive Escherichia coli infection. In regards to the recent rib fracture she reports she is on Percocet which as been helpful in pain management.   Assessment & Plan:   Principal Problem:   Acute right-sided weakness Active Problems:   Hyperlipidemia   Essential hypertension   Recent UTI (urinary tract infection)   Partial symptomatic epilepsy with complex partial seizures, not intractable, without status epilepticus ([place])   History of stroke   History of resection of meningioma   Right rib fractures   Acute right-sided weakness/history of prior stroke -Patient presents with waxing and waning acute right-sided weakness with facial drooping and dysarthria-currently back to baseline -Appreciate neurology assistance -TIA/ischemic stroke order set initiated -No MRI secondary to intracranial clips from prior meningioma resection -History of similar symptoms in past that were never clearly elucidated as to etiology: ?? TIA/stroke versus atypical seizures -Carotid duplex and echocardiogram pending -Hemoglobin A1c pending -Antiplatelet with preadmission Plavix -PT/OT/SLP noting at baseline due to problems from lower extremity paresis from prior meningioma resection is wheelchair-bound at baseline    Partial symptomatic epilepsy with complex partial seizures,  not intractable, without status epilepticus    -Recent adjustments in AEDs but w/ reported normal therapeutic levels as early as last week -Repeating Trileptal level and continue preadmission Trileptal -Monitor for seizure activity - EEG per neurology -Uncertain if presenting symptoms more reflective of complex seizure - note pt did receive a floroquinolone in mid September for treatment of UTI; this has been at least 3 weeks ago and uncertain if this may have potentiated any seizure activity noting this class of drugs can lower seizure threshold      Recent UTI (urinary tract infection) -Repeat urinalysis and culture to ensure eradication of previous organism especially with presentation of altered mentation and strokelike symptoms - moderate LE, few bacteria but squamous cells noted - awaiting culture    History of resection of meningioma -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 and is wheelchair-bound -Has surgical clips in place so unable to pursue MRIs -Continue preadmission benzodiazepines and skeletal muscular relaxers    Right rib fractures -Continue preadmission Percocet with current pain levels being reported as controlled    Hyperlipidemia -Continue Lipitor    Essential hypertension -Initial blood pressure not well controlled at 172/87 with current blood pressure controlled -Continue preadmission Tenormin      DVT prophylaxis: Lovenox  Code Status: Full  Family Communication: Husband and family friend at bedside Disposition Plan: Anticipate discharge back to preadmission home environment once medically stable   Consultants:   Neurology  Procedures:   None  Antimicrobials:   none    Subjective: Patient seen when she just came back from EEG.  States she was frustrated with herself when she was evaluated by Speech therapy today.  Worried this is what happened to her years ago all over again.  Patient states her pain is significantly improved and that she is able to roll to the side  without excrutiating pain.  Eating well.  Able to move arms and legs. Husband questions patient's cognitive ability to care for herself at home as they were told she would need 24 hour supervision.  Objective: Vitals:   06/03/16 0030 06/03/16 0200 06/03/16 0400 06/03/16 0600  BP: 114/89 115/60 133/69 125/70  Pulse: (!) 58 (!) 57 (!) 54   Resp: 16 18 18 16   Temp: 97.5 F (36.4 C) 97.5 F (36.4 C) 97.6 F (36.4 C) 98.2 F (36.8 C)  TempSrc: Oral Oral Oral Oral  SpO2: 98% 99% 100% 100%  Weight:      Height:        Intake/Output Summary (Last 24 hours) at 06/03/16 1319 Last data filed at 06/03/16 0650  Gross per 24 hour  Intake                0 ml  Output              750 ml  Net             -750 ml   Filed Weights   06/02/16 2000  Weight: 91.8 kg (202 lb 6.4 oz)    Examination:  General exam: Appears calm and comfortable  Respiratory system: Clear to auscultation. Respiratory effort normal. Cardiovascular system: S1 & S2 heard, RRR. No JVD, murmurs, rubs, gallops or clicks. No pedal edema. Gastrointestinal system: Abdomen is nondistended, soft and nontender. No organomegaly or masses felt. Normal bowel sounds heard. Central nervous system: Alert and oriented to person, place and situation- not to time. Extremities: Symmetric 5 x 5 power in upper extremities, no power in lower extremities Skin:  No rashes, lesions or ulcers Psychiatry: Mood & affect appropriate.     Data Reviewed: I have personally reviewed following labs and imaging studies  CBC:  Recent Labs Lab 06/02/16 1431 06/02/16 1508 06/03/16 0408  WBC  --  7.9 6.7  NEUTROABS  --  5.0 3.4  HGB 16.0* 15.1* 14.2  HCT 47.0* 45.3 42.6  MCV  --  89.5 88.9  PLT  --  205 [id number]   Basic Metabolic Panel:  Recent Labs Lab 06/02/16 1430 06/02/16 1431 06/03/16 0408  NA 135 134* 134*  K 4.9 4.6 3.8  CL 102 99* 101  CO2 22  --  22  GLUCOSE 103* 109* 89  BUN 11 15 8   CREATININE 0.68 0.50 0.49  CALCIUM 9.5   --  9.4   GFR: Estimated Creatinine Clearance: 75.5 mL/min (by C-G formula based on SCr of 0.49 mg/dL). Liver Function Tests:  Recent Labs Lab 06/02/16 1430  AST 32  ALT 22  ALKPHOS 86  BILITOT 1.4*  PROT 6.7  ALBUMIN 4.1   No results for input(s): LIPASE, AMYLASE in the last 168 hours.  Recent Labs Lab 06/02/16 1905  AMMONIA 25   Coagulation Profile:  Recent Labs Lab 06/02/16 1502  INR 1.03   Cardiac Enzymes: No results for input(s): CKTOTAL, CKMB, CKMBINDEX, TROPONINI in the last 168 hours. BNP (last 3 results) No results for input(s): PROBNP in the last 8760 hours. HbA1C: No results for input(s): HGBA1C in the last 72 hours. CBG: No results for input(s): GLUCAP in the last 168 hours. Lipid Profile:  Recent Labs  06/03/16 0408  CHOL 154  HDL 55  LDLCALC 79  TRIG 99  CHOLHDL 2.8   Thyroid Function Tests:  Recent Labs  06/02/16 1852  TSH 0.790   Anemia Panel:  Recent Labs  06/02/16 1905  VITAMINB12 435   Sepsis Labs: No results for input(s): PROCALCITON, LATICACIDVEN in the last 168 hours.  No results found for this or any previous visit (from the past 240 hour(s)).       Radiology Studies: Ct Angio Head W Or Wo Contrast  Result Date: 06/02/2016 CLINICAL DATA:  Right arm weakness and slurred speech. Prior meningioma resection. EXAM: CT ANGIOGRAPHY HEAD AND NECK TECHNIQUE: Multidetector CT imaging of the head and neck was performed using the standard protocol during bolus administration of intravenous contrast. Multiplanar CT image reconstructions and MIPs were obtained to evaluate the vascular anatomy. Carotid stenosis measurements (when applicable) are obtained utilizing NASCET criteria, using the distal internal carotid diameter as the denominator. CONTRAST:  50 mL Isovue 370 COMPARISON:  Head and neck CTA 08/26/2012 FINDINGS: CTA NECK FINDINGS Aortic arch: 3 vessel aortic arch with minimal atherosclerosis. Arch vessel origins are widely  patent. Right carotid system: Patent with focal calcified plaque in the proximal ICA. No stenosis. Left carotid system: Patent with minimal non stenotic plaque at the carotid bifurcation. Vertebral arteries: Patent without evidence of stenosis. The left vertebral artery is dominant. Both vertebral arteries are somewhat tortuous proximally. Skeleton: Moderately advanced cervical facet arthrosis, greater on the left. Cervical disc degeneration most advanced at C5-6. Other neck: No evidence of acute abnormality or mass. Upper chest: Unremarkable. Review of the MIP images confirms the above findings CTA HEAD FINDINGS Anterior circulation: Internal carotid arteries are widely patent from skullbase to carotid termini. ACAs and MCAs are patent without evidence of major branch occlusion or significant proximal stenosis. Right A2 segment again noted to be dominant. Mild branch vessel irregularity. No aneurysm  identified. Posterior circulation: Intracranial vertebral arteries are patent to the basilar with the left being dominant. Patent PICA, AICA, and SCA origins. Basilar artery is widely patent. Posterior communicating arteries are not identified. PCAs are patent with mild asymmetric attenuation of distal right PCA branch vessels but no evidence of significant proximal stenosis. No aneurysm identified. Venous sinuses: Not well evaluated due to arterial phase contrast timing. Anatomic variants: None of significance. Review of the MIP images confirms the above findings IMPRESSION: Very mild intracranial and extracranial atherosclerosis without evidence of major vessel occlusion or significant stenosis. These results were called by telephone at the time of interpretation on 06/02/2016 at 3:07 pm to Dr. Jaynee Eagles, who verbally acknowledged these results. Electronically Signed   By: Logan Bores M.D.   On: 06/02/2016 15:16   Ct Angio Neck W Or Wo Contrast  Result Date: 06/02/2016 CLINICAL DATA:  Right arm weakness and slurred  speech. Prior meningioma resection. EXAM: CT ANGIOGRAPHY HEAD AND NECK TECHNIQUE: Multidetector CT imaging of the head and neck was performed using the standard protocol during bolus administration of intravenous contrast. Multiplanar CT image reconstructions and MIPs were obtained to evaluate the vascular anatomy. Carotid stenosis measurements (when applicable) are obtained utilizing NASCET criteria, using the distal internal carotid diameter as the denominator. CONTRAST:  50 mL Isovue 370 COMPARISON:  Head and neck CTA 08/26/2012 FINDINGS: CTA NECK FINDINGS Aortic arch: 3 vessel aortic arch with minimal atherosclerosis. Arch vessel origins are widely patent. Right carotid system: Patent with focal calcified plaque in the proximal ICA. No stenosis. Left carotid system: Patent with minimal non stenotic plaque at the carotid bifurcation. Vertebral arteries: Patent without evidence of stenosis. The left vertebral artery is dominant. Both vertebral arteries are somewhat tortuous proximally. Skeleton: Moderately advanced cervical facet arthrosis, greater on the left. Cervical disc degeneration most advanced at C5-6. Other neck: No evidence of acute abnormality or mass. Upper chest: Unremarkable. Review of the MIP images confirms the above findings CTA HEAD FINDINGS Anterior circulation: Internal carotid arteries are widely patent from skullbase to carotid termini. ACAs and MCAs are patent without evidence of major branch occlusion or significant proximal stenosis. Right A2 segment again noted to be dominant. Mild branch vessel irregularity. No aneurysm identified. Posterior circulation: Intracranial vertebral arteries are patent to the basilar with the left being dominant. Patent PICA, AICA, and SCA origins. Basilar artery is widely patent. Posterior communicating arteries are not identified. PCAs are patent with mild asymmetric attenuation of distal right PCA branch vessels but no evidence of significant proximal  stenosis. No aneurysm identified. Venous sinuses: Not well evaluated due to arterial phase contrast timing. Anatomic variants: None of significance. Review of the MIP images confirms the above findings IMPRESSION: Very mild intracranial and extracranial atherosclerosis without evidence of major vessel occlusion or significant stenosis. These results were called by telephone at the time of interpretation on 06/02/2016 at 3:07 pm to Dr. Jaynee Eagles, who verbally acknowledged these results. Electronically Signed   By: Logan Bores M.D.   On: 06/02/2016 15:16   Dg Chest Portable 1 View  Result Date: 06/02/2016 CLINICAL DATA:  Weakness. EXAM: PORTABLE CHEST 1 VIEW COMPARISON:  Radiographs of June 01, 2016. FINDINGS: The heart size and mediastinal contours are within normal limits. Both lungs are clear. No pneumothorax or pleural effusion is noted. Atherosclerosis of thoracic aorta is noted. The visualized skeletal structures are unremarkable. IMPRESSION: Aortic atherosclerosis.  No acute cardiopulmonary abnormality seen. Electronically Signed   By: Marijo Conception, M.D.  On: 06/02/2016 16:35   Ct Head Code Stroke Wo Contrast  Result Date: 06/02/2016 CLINICAL DATA:  Code stroke. Right-sided weakness, slurred speech, and confusion. EXAM: CT HEAD WITHOUT CONTRAST TECHNIQUE: Contiguous axial images were obtained from the base of the skull through the vertex without intravenous contrast. COMPARISON:  05/30/2013 FINDINGS: Brain: Chronic craniectomy changes involving the posterior skull and vertex with wires and clips which extend into the interhemispheric fissure and result in streak artifact, mildly limiting assessment of this region. Chronic encephalomalacia and gliosis in the superior aspects of both cerebral hemispheres are similar to the prior study. A small chronic infarct involving the anterior right basal ganglia/ white matter adjacent to the frontal horn is unchanged. There is no definite evidence of acute  cortical infarct, intracranial hemorrhage, mass, midline shift, or extra-axial fluid collection. Vascular: No hyperdense vessel or unexpected calcification. Skull: Craniectomy changes and unchanged diffuse skull heterogeneity predominantly in the frontal region. Sinuses/Orbits: Unremarkable orbits. Visualized paranasal sinuses and mastoid air cells are clear. Other: None. ASPECTS [facility] Stroke Program Early CT Score) - Ganglionic level infarction (caudate, lentiform nuclei, internal capsule, insula, M1-M3 cortex): 7 - Supraganglionic infarction (M4-M6 cortex): 3 Total score (0-10 with 10 being normal): 10 IMPRESSION: 1. No evidence of acute intracranial abnormality. 2. ASPECTS is 10. 3. Chronic changes from prior meningioma resection. 4. Chronic right anterior basal ganglia infarct. These results were called by telephone at the time of interpretation on 06/02/2016 at 2:44 pm to Dr. Jaynee Eagles, who verbally acknowledged these results. Electronically Signed   By: Logan Bores M.D.   On: 06/02/2016 14:46        Scheduled Meds: . atenolol  25 mg Oral QHS  . atenolol  50 mg Oral Daily  . atorvastatin  10 mg Oral BH-q7a  . baclofen  20 mg Oral QID  . clopidogrel  75 mg Oral Q breakfast  . diazepam  5 mg Oral QHS  . docusate sodium  100 mg Oral BID  . enoxaparin (LOVENOX) injection  40 mg Subcutaneous Q24H  . letrozole  2.5 mg Oral QHS  . LORazepam  1 mg Oral BID  . methocarbamol  500 mg Oral TID  . multivitamin with minerals  1 tablet Oral BH-q7a  . Oxcarbazepine  300 mg Oral BID  . oxyCODONE-acetaminophen  2 tablet Oral TID  . white petrolatum       Continuous Infusions:    LOS: 0 days    Time spent: 35 minutes    Newman Pies, MD [facility] Pager [phone]  If 7PM-7AM, please contact night-coverage [email or web address] Password [email or web address] 06/03/2016, 1:19 PM

## 2016-06-03 NOTE — Evaluation (Addendum)
Speech Language Pathology Evaluation Patient Details Name: Stacy Diaz MRN: [id number] DOB: 09/22/1950 Today's Date: 06/03/2016 Time: [id number] SLP Time Calculation (min) (ACUTE ONLY): 30 min  Problem List:  Patient Active Problem List   Diagnosis Date Noted  . Acute right-sided weakness 06/02/2016  . History of resection of meningioma 06/02/2016  . Right rib fractures 06/02/2016  . Bacterial folliculitis [id number]  . Family history of breast cancer 06/07/2014  . Breast cancer of lower-outer quadrant of left female breast ([place]) 05/25/2014  . Partial symptomatic epilepsy with complex partial seizures, not intractable, without status epilepticus ([place]) 04/29/2014  . History of stroke 04/29/2014  . Facial droop 09/02/2012  . Recent UTI (urinary tract infection) 08/26/2012  . 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 Boil of buttock 09/28/2011  . BENIGN NEOPLASM OF BRAIN 03/31/2010  . INJURY, NERVE, PELVIS/LWR LIMB NOS 05/20/2007  . Hyperlipidemia 05/12/2007  . Essential hypertension 05/12/2007  . PREMATURE VENTRICULAR CONTRACTIONS 05/12/2007   Past Medical History:  Past Medical History:  Diagnosis Date  . Anxiety   . Arthritis    "probably" (08/25/2012)  . Breast cancer of lower-outer quadrant of left female breast ([place]) 05/25/2014  . Headache(784.0)    "not real frequent" (08/25/2012)  . Heart disease   . HTN (hypertension)   . Hyperlipidemia   . Meningioma ([place]) 1999  . Myocardial infarction 1987  . 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 S/P radiation therapy 07/30/14-08/30/13   left breast cancer/50Gy  . Seizures ([place])    last seizure 1 month ago  . Spastic paraparesis    S/P meningioma resection 1999/notes 08/25/2012  . Stroke [facility]) 08/25/2012   "that's what they think I've had; sudden LUE weakness" (08/25/2012)   Past Surgical History:  Past Surgical History:  Procedure Laterality Date  . BRAIN MENINGIOMA EXCISION  1999  . BREAST  LUMPECTOMY WITH NEEDLE LOCALIZATION AND AXILLARY SENTINEL LYMPH NODE BX Left 06/22/2014   Procedure: LEFT WIRE LOCALIZED LUMPECTOPMY AND SENTINEL NODE MAPPING;  Surgeon: Autumn Messing III, MD;  Location: [place];  Service: General;  Laterality: Left;  . CARDIAC CATHETERIZATION  1987  . CORONARY ANGIOPLASTY  1987   denied intervention: "blockage wasn't bad enough"   HPI:  65 y.o. female with medical history significant for meningioma resection in 1998 resulting in bilateral lower extremity paralysis, history of strokelike symptoms without definitive diagnosis in 2014, apparent formal diagnosis of CVA in 2016, seizure disorder with apparent recurrent seizures multiple adjustments in AEDs over the years, history of breast cancer, lipidemia, hypertension and recent treatment for pansensitive Escherichia coli UTI last month. Of note patient fell out of her wheelchair recently an x-ray performed yesterday revealed 2 right rib fractures. On 10/17 pt developed abrupt onset of right-sided facial drooping and right-sided weakness w/ dysarthria. CT on 10/17 negative for acute infarct.    Assessment / Plan / Recommendation Clinical Impression  Pt has mild cognitive impairments in the areas of selective attention, complex problem solving, working memory, and retrieval of new information. Min-Mod cues provided for recall of the days events and recommendations from prior therapists. Pt and her spouse both report that this is an acute change. Recommend additional SLP f/u to maximize cognitive function and independence as medical w/u continues.     SLP Assessment  Patient needs continued Speech Lanaguage Pathology Services    Follow Up Recommendations  Home health SLP;24 hour supervision/assistance    Frequency and Duration min 2x/week  2 weeks  SLP Evaluation Cognition  Overall Cognitive Status: Within Functional Limits for tasks assessed Arousal/Alertness: Awake/alert Orientation Level: Oriented  X4 Attention: Selective Selective Attention: Impaired Selective Attention Impairment: Verbal basic Memory: Impaired Memory Impairment: Other (comment);Decreased recall of new information (working memory) Awareness: Appears intact Problem Solving: Impaired Problem Solving Impairment: Functional complex;Verbal complex Executive Function: Sequencing Sequencing: Impaired Sequencing Impairment: Verbal complex Safety/Judgment: Appears intact       Comprehension  Auditory Comprehension Overall Auditory Comprehension: Impaired Commands: Impaired Complex Commands: 50-74% accurate    Expression Expression Primary Mode of Expression: Verbal Verbal Expression Overall Verbal Expression: Appears within functional limits for tasks assessed Written Expression Dominant Hand: Right   Oral / Motor  Motor Speech Overall Motor Speech: Appears within functional limits for tasks assessed   GO          Functional Assessment Tool Used: skilled clinical judgment Functional Limitations: Attention Attention Current Status [id number]): At least 20 percent but less than 40 percent impaired, limited or restricted Attention Goal Status [id number]): At least 1 percent but less than 20 percent impaired, limited or restricted         Germain Osgood 06/03/2016, 3:01 PM  Germain Osgood, M.A. CCC-SLP [phone]

## 2016-06-03 NOTE — Evaluation (Signed)
Occupational Therapy Evaluation Patient Details Name: Stacy Diaz MRN: [id number] DOB: [birth year] Today's Date: 06/03/2016    History of Present Illness 65 y.o. female with medical history significant for meningioma resection in 1998 resulting in bilateral lower extremity paralysis, history of strokelike symptoms without definitive diagnosis in 2014, apparent formal diagnosis of CVA in 2016, seizure disorder with apparent recurrent seizures multiple adjustments in AEDs over the years, history of breast cancer, lipidemia, hypertension and recent treatment for pansensitive Escherichia coli UTI last month. Of note patient fell out of her wheelchair recently an x-ray performed yesterday revealed 2 right rib fractures. On 10/17 pt developed abrupt onset of right-sided facial drooping and right-sided weakness w/ dysarthria. CT on 10/17 negative for acute infarct.    Clinical Impression   Pt reports she required assist from her husband and a PCA with LB ADL and transfers PTA. Currently pt max assist +2 for stand pivot transfers and mod-total assist for ADL. Pt with difficulty maintaining sitting balance while sitting EOB (strong R lateral and posterior lean); ?pain from rib fxs. Pt presenting with mild UE weakness, poor sitting/standing balance, and painful with movement impacting her independence and safety with ADL and functional mobility. Encouraged OOB activity and functional independence as able upon return home; pt and husband verbalize understanding. Pt planning to d/c home with 24/7 supervision. Recommending HHOT and [facility] aide for follow up to maximize independence and safety with ADL and functional mobility upon return home. Pt would benefit from continued skilled OT to address established goals.    Follow Up Recommendations  Home health OT;Supervision/Assistance - 24 hour;Other (comment) ([facility] aide)    Equipment Recommendations  None recommended by OT    Recommendations for Other Services        Precautions / Restrictions Precautions Precautions: Fall Precaution Comments: bil AFOs Restrictions Weight Bearing Restrictions: No      Mobility Bed Mobility Overal bed mobility: Needs Assistance;+2 for physical assistance Bed Mobility: Supine to Sit     Supine to sit: Max assist;+2 for physical assistance;HOB elevated     General bed mobility comments: Assist for bil LEs to EOB and for trunk elevation. Pt minimally assisting with UEs.  Transfers Overall transfer level: Needs assistance Equipment used: 2 person hand held assist Transfers: Sit to/from [facility] Sit to Stand: Max assist;+2 physical assistance Stand pivot transfers: Max assist;+2 physical assistance       General transfer comment: With use of bed pad for sit to stand. Bil knees and feet blocked.    Balance Overall balance assessment: Needs assistance Sitting-balance support: Feet supported;Bilateral upper extremity supported Sitting balance-Leahy Scale: Poor Sitting balance - Comments: R and posterior lean in sitting. Difficulty releasing UEs and maintaining support. Postural control: Posterior lean;Right lateral lean Standing balance support: Bilateral upper extremity supported Standing balance-Leahy Scale: Zero                              ADL Overall ADL's : Needs assistance/impaired Eating/Feeding: Set up;Sitting Eating/Feeding Details (indicate cue type and reason): Encouraged functional independence with self feeding. Grooming: Moderate assistance;Sitting Grooming Details (indicate cue type and reason): Assist for sitting balance Upper Body Bathing: Moderate assistance;Sitting   Lower Body Bathing: Total assistance;Sitting/lateral leans   Upper Body Dressing : Moderate assistance;Sitting   Lower Body Dressing: Total assistance;Sitting/lateral leans   Toilet Transfer: Maximal assistance;+2 for physical assistance;Stand-pivot;BSC Toilet Transfer Details  (indicate cue type and reason): Simulated by stand  pivot from EOB to chair         Functional mobility during ADLs: Maximal assistance;+2 for physical assistance (for stand pivot only) General ADL Comments: Encouraged OOB upon return home and functional independence as tolerated. Pt seems to self limit/pain limiting particiaption.     Vision Vision Assessment?: No apparent visual deficits   Perception     Praxis      Pertinent Vitals/Pain Pain Assessment: 0-10 Pain Score: 8  Pain Location: all over Pain Descriptors / Indicators: Grimacing;Aching Pain Intervention(s): Monitored during session;Repositioned;RN gave pain meds during session;Ice applied     Hand Dominance Right   Extremity/Trunk Assessment Upper Extremity Assessment Upper Extremity Assessment: Generalized weakness   Lower Extremity Assessment Lower Extremity Assessment: Defer to PT evaluation   Cervical / Trunk Assessment Cervical / Trunk Assessment: Kyphotic   Communication Communication Communication: No difficulties   Cognition Arousal/Alertness: Awake/alert Behavior During Therapy: WFL for tasks assessed/performed Overall Cognitive Status: Within Functional Limits for tasks assessed                     General Comments       Exercises       Shoulder Instructions      Home Living Family/patient expects to be discharged to:: Private residence Living Arrangements: Spouse/significant other Available Help at Discharge: Family;Personal care attendant Type of Home: House Home Access: Other (comment) (lift for entry into house)     Home Layout: One level     Bathroom Shower/Tub: Occupational psychologist: Standard Bathroom Accessibility: Yes How Accessible: Accessible via wheelchair Home Equipment: Shower seat;Grab bars - toilet;Grab bars - tub/shower;Wheelchair - power;Hospital bed   Additional Comments: caregiver 3 days per week for 2 hours      Prior  Functioning/Environment Level of Independence: Needs assistance  Gait / Transfers Assistance Needed: spin disc for all transfers, husband assists. Mobility at wheelchair level. ADL's / Homemaking Assistance Needed: caregiver/husband assists with bathing and dressing. Needs assist with LB and fastening bra            OT Problem List: Decreased strength;Decreased activity tolerance;Impaired balance (sitting and/or standing);Decreased knowledge of use of DME or AE;Decreased knowledge of precautions;Pain   OT Treatment/Interventions: Self-care/ADL training;Therapeutic exercise;Energy conservation;DME and/or AE instruction;Therapeutic activities;Patient/family education;Balance training    OT Goals(Current goals can be found in the care plan section) Acute Rehab OT Goals Patient Stated Goal: return home OT Goal Formulation: With patient/family Time For Goal Achievement: 06/17/16 Potential to Achieve Goals: Good  OT Frequency: Min 2X/week   Barriers to D/C:            Co-evaluation PT/OT/SLP Co-Evaluation/Treatment: Yes Reason for Co-Treatment: Complexity of the patient's impairments (multi-system involvement);For patient/therapist safety   OT goals addressed during session: ADL's and self-care;Other (comment) (functional mobility)      End of Session Nurse Communication: Mobility status  Activity Tolerance: Patient tolerated treatment well Patient left: in chair;with call bell/phone within reach;with chair alarm set;with family/visitor present   Time: [id number] OT Time Calculation (min): 45 min Charges:  OT General Charges $OT Visit: 1 Procedure OT Evaluation $OT Eval Moderate Complexity: 1 Procedure G-Codes: OT G-codes **NOT FOR INPATIENT CLASS** Functional Assessment Tool Used: Clinical judgement Functional Limitation: Self care Self Care Current Status [id number]): At least 60 percent but less than 80 percent impaired, limited or restricted Self Care Goal Status [id number]): At  least 40 percent but less than 60 percent impaired, limited or restricted   Binnie Kand M.S.,  OTR/L Pager: [phone]  06/03/2016, 11:10 AM

## 2016-06-03 NOTE — Care Management Obs Status (Signed)
[facility] NOTIFICATION   Patient Details  Name: Stacy Diaz MRN: [id number] Date of Birth: 1951/07/09   Medicare Observation Status Notification Given:  Yes    [name], RN 06/03/2016, 4:53 PM

## 2016-06-03 NOTE — Procedures (Signed)
ELECTROENCEPHALOGRAM REPORT  Date of Study: 06/03/2016  Patient's Name: Stacy Diaz MRN: [id number] Date of Birth: 1951-05-14  Referring Provider: Burnetta Sabin, NP  Clinical History: This is a 65 year old woman with a history of meningioma resection, seizures, with right-sided weakness.  Medications: Oxcarbazepine (TRILEPTAL) tablet 300 mg  atenolol (TENORMIN) tablet 25 mg  atenolol (TENORMIN) tablet 50 mg  atorvastatin (LIPITOR) tablet 10 mg  baclofen (LIORESAL) tablet 20 mg  clopidogrel (PLAVIX) tablet 75 mg  diazepam (VALIUM) tablet 5 mg  docusate sodium (COLACE) capsule 100 mg  enoxaparin (LOVENOX) injection 40 mg  letrozole (FEMARA) tablet 2.5 mg  LORazepam (ATIVAN) tablet 1 mg  methocarbamol (ROBAXIN) tablet 500 mg  multivitamin with minerals tablet 1 tablet  oxyCODONE-acetaminophen (PERCOCET/ROXICET) 5-325 MG per tablet 2 tablet  polyethylene glycol (MIRALAX / GLYCOLAX) packet 17 g  white petrolatum (VASELINE) gel   Technical Summary: A multichannel digital EEG recording measured by the international 10-20 system with electrodes applied with paste and impedances below 5000 ohms performed as portable with EKG monitoring in an awake and asleep patient.  Hyperventilation and photic stimulation were not performed.  The digital EEG was referentially recorded, reformatted, and digitally filtered in a variety of bipolar and referential montages for optimal display.   Description: The patient is awake and asleep during the recording.  During maximal wakefulness, there is a symmetric, medium voltage 7.5 Hz posterior dominant rhythm that attenuates with eye opening. This is admixed with a small amount of diffuse 4-6 Hz theta slowing of the waking background.  During drowsiness and sleep, there is an increase in theta slowing of the background.  Vertex waves and symmetric sleep spindles were seen.  Hyperventilation and photic stimulation were not performed.  There were no  epileptiform discharges or electrographic seizures seen.    EKG lead was unremarkable.  Impression: This awake and asleep EEG is abnormal due to mild diffuse slowing of the waking background.  Clinical Correlation of the above findings indicates diffuse cerebral dysfunction that is non-specific in etiology and can be seen with hypoxic/ischemic injury, toxic/metabolic encephalopathies, neurodegenerative disorders, or medication effect. In the correct clinical setting, it can also be seen with excessive drowsiness. The absence of epileptiform discharges does not rule out a clinical diagnosis of epilepsy.  Clinical correlation is advised.   Ellouise Newer, M.D.

## 2016-06-03 NOTE — Progress Notes (Signed)
STROKE TEAM PROGRESS NOTE   HISTORY OF PRESENT ILLNESS (per record) Stacy Diaz is an 65 y.o. female with history of previous meningioma resection with subsequent spastic paraparesis, seizures.  Today at 1445 noted confusion, left facial decreased sensation, right arm weakness. On arrival she had improved symptoms on the right and was not confused. CT of head was obtained and CTA was also obtained both negative for acute event. At her baseline she has trouble angulating for the past 4 years but could bear weight on her legs but needs help with transfers. Patient has a history of seizures and is on Tegretol. She states she fell in the bathroom yesterday but denies decreased LOC or hitting her head. She fractured her ribs and says she is in pain and has been increasingly taken percocet for the rib pain.   Patient has had multiple left-sided episodes of weakness but none on the right. Husband at bedside. She developed left arm weakness and facial droop on the morning of 08/25/2012. She had multiple episodes in the past similar to the episode above but this one had been persistent. MRI was not an option since she had history of no clips to 2 meningioma resection. Since her surgery she has had trouble with weakness from waist down. She was last known well on 06/02/2016 at 14:45. Patient was not administered IV t-PA secondary to  resolving symptoms. She was admitted for further evaluation and treatment.   SUBJECTIVE (INTERVAL HISTORY) Her husband as well as OT team are  at the bedside.  Overall she feels her condition is stable. She recounted HPI with Dr. Leonie Man. Patient appeared confused as her recollection differed from her husbands.   OBJECTIVE Temp:  [97.5 F (36.4 C)-98.2 F (36.8 C)] 98.2 F (36.8 C) (10/18 0600) Pulse Rate:  [54-74] 54 (10/18 0400) Cardiac Rhythm: Normal sinus rhythm (10/17 1900) Resp:  [9-22] 16 (10/18 0600) BP: (113-176)/(60-98) 125/70 (10/18 0600) SpO2:  [97 %-100 %]  100 % (10/18 0600) Weight:  [91.8 kg (202 lb 6.4 oz)] 91.8 kg (202 lb 6.4 oz) (10/17 2000)  CBC:  Recent Labs Lab 06/02/16 1508 06/03/16 0408  WBC 7.9 6.7  NEUTROABS 5.0 3.4  HGB 15.1* 14.2  HCT 45.3 42.6  MCV 89.5 88.9  PLT 205 [id number]    Basic Metabolic Panel:  Recent Labs Lab 06/02/16 1430 06/02/16 1431 06/03/16 0408  NA 135 134* 134*  K 4.9 4.6 3.8  CL 102 99* 101  CO2 22  --  22  GLUCOSE 103* 109* 89  BUN 11 15 8   CREATININE 0.68 0.50 0.49  CALCIUM 9.5  --  9.4    Lipid Panel:    Component Value Date/Time   CHOL 154 06/03/2016 0408   TRIG 99 06/03/2016 0408   HDL 55 06/03/2016 0408   CHOLHDL 2.8 06/03/2016 0408   VLDL 20 06/03/2016 0408   LDLCALC 79 06/03/2016 0408   HgbA1c:  Lab Results  Component Value Date   HGBA1C 5.4 05/31/2013   Urine Drug Screen:    Component Value Date/Time   LABOPIA NONE DETECTED 06/02/2016 1936   COCAINSCRNUR NONE DETECTED 06/02/2016 1936   LABBENZ POSITIVE (A) 06/02/2016 1936   AMPHETMU NONE DETECTED 06/02/2016 1936   THCU NONE DETECTED 06/02/2016 1936   LABBARB NONE DETECTED 06/02/2016 1936      IMAGING  Ct Angio Head W Or Wo Contrast  Result Date: 06/02/2016 CLINICAL DATA:  Right arm weakness and slurred speech. Prior meningioma resection. EXAM: CT ANGIOGRAPHY HEAD  AND NECK TECHNIQUE: Multidetector CT imaging of the head and neck was performed using the standard protocol during bolus administration of intravenous contrast. Multiplanar CT image reconstructions and MIPs were obtained to evaluate the vascular anatomy. Carotid stenosis measurements (when applicable) are obtained utilizing NASCET criteria, using the distal internal carotid diameter as the denominator. CONTRAST:  50 mL Isovue 370 COMPARISON:  Head and neck CTA 08/26/2012 FINDINGS: CTA NECK FINDINGS Aortic arch: 3 vessel aortic arch with minimal atherosclerosis. Arch vessel origins are widely patent. Right carotid system: Patent with focal calcified plaque in the  proximal ICA. No stenosis. Left carotid system: Patent with minimal non stenotic plaque at the carotid bifurcation. Vertebral arteries: Patent without evidence of stenosis. The left vertebral artery is dominant. Both vertebral arteries are somewhat tortuous proximally. Skeleton: Moderately advanced cervical facet arthrosis, greater on the left. Cervical disc degeneration most advanced at C5-6. Other neck: No evidence of acute abnormality or mass. Upper chest: Unremarkable. Review of the MIP images confirms the above findings CTA HEAD FINDINGS Anterior circulation: Internal carotid arteries are widely patent from skullbase to carotid termini. ACAs and MCAs are patent without evidence of major branch occlusion or significant proximal stenosis. Right A2 segment again noted to be dominant. Mild branch vessel irregularity. No aneurysm identified. Posterior circulation: Intracranial vertebral arteries are patent to the basilar with the left being dominant. Patent PICA, AICA, and SCA origins. Basilar artery is widely patent. Posterior communicating arteries are not identified. PCAs are patent with mild asymmetric attenuation of distal right PCA branch vessels but no evidence of significant proximal stenosis. No aneurysm identified. Venous sinuses: Not well evaluated due to arterial phase contrast timing. Anatomic variants: None of significance. Review of the MIP images confirms the above findings IMPRESSION: Very mild intracranial and extracranial atherosclerosis without evidence of major vessel occlusion or significant stenosis. These results were called by telephone at the time of interpretation on 06/02/2016 at 3:07 pm to Dr. Jaynee Eagles, who verbally acknowledged these results. Electronically Signed   By: Logan Bores M.D.   On: 06/02/2016 15:16   Dg Ribs Unilateral W/chest Right  Result Date: 06/01/2016 CLINICAL DATA:  Fall on Friday, right rib pain EXAM: RIGHT RIBS AND CHEST - 3+ VIEW COMPARISON:  None. FINDINGS: Five  views right ribs submitted. There is nondisplaced fracture of the eighth rib. Minimal displaced fracture of the right fifth rib. No pneumothorax. IMPRESSION: There is nondisplaced fracture of the eighth rib. Minimal displaced fracture of the right fifth rib. No pneumothorax. Electronically Signed   By: Lahoma Crocker M.D.   On: 06/01/2016 12:24   Dg Sternum  Result Date: 06/01/2016 CLINICAL DATA:  Fall on Friday, right rib pain EXAM: STERNUM - 2+ VIEW COMPARISON:  None. FINDINGS: There is no evidence of fracture or other focal bone lesions. IMPRESSION: Negative. Electronically Signed   By: Lahoma Crocker M.D.   On: 06/01/2016 12:25   Ct Angio Neck W Or Wo Contrast  Result Date: 06/02/2016 CLINICAL DATA:  Right arm weakness and slurred speech. Prior meningioma resection. EXAM: CT ANGIOGRAPHY HEAD AND NECK TECHNIQUE: Multidetector CT imaging of the head and neck was performed using the standard protocol during bolus administration of intravenous contrast. Multiplanar CT image reconstructions and MIPs were obtained to evaluate the vascular anatomy. Carotid stenosis measurements (when applicable) are obtained utilizing NASCET criteria, using the distal internal carotid diameter as the denominator. CONTRAST:  50 mL Isovue 370 COMPARISON:  Head and neck CTA 08/26/2012 FINDINGS: CTA NECK FINDINGS Aortic arch: 3 vessel  aortic arch with minimal atherosclerosis. Arch vessel origins are widely patent. Right carotid system: Patent with focal calcified plaque in the proximal ICA. No stenosis. Left carotid system: Patent with minimal non stenotic plaque at the carotid bifurcation. Vertebral arteries: Patent without evidence of stenosis. The left vertebral artery is dominant. Both vertebral arteries are somewhat tortuous proximally. Skeleton: Moderately advanced cervical facet arthrosis, greater on the left. Cervical disc degeneration most advanced at C5-6. Other neck: No evidence of acute abnormality or mass. Upper chest:  Unremarkable. Review of the MIP images confirms the above findings CTA HEAD FINDINGS Anterior circulation: Internal carotid arteries are widely patent from skullbase to carotid termini. ACAs and MCAs are patent without evidence of major branch occlusion or significant proximal stenosis. Right A2 segment again noted to be dominant. Mild branch vessel irregularity. No aneurysm identified. Posterior circulation: Intracranial vertebral arteries are patent to the basilar with the left being dominant. Patent PICA, AICA, and SCA origins. Basilar artery is widely patent. Posterior communicating arteries are not identified. PCAs are patent with mild asymmetric attenuation of distal right PCA branch vessels but no evidence of significant proximal stenosis. No aneurysm identified. Venous sinuses: Not well evaluated due to arterial phase contrast timing. Anatomic variants: None of significance. Review of the MIP images confirms the above findings IMPRESSION: Very mild intracranial and extracranial atherosclerosis without evidence of major vessel occlusion or significant stenosis. These results were called by telephone at the time of interpretation on 06/02/2016 at 3:07 pm to Dr. Jaynee Eagles, who verbally acknowledged these results. Electronically Signed   By: Logan Bores M.D.   On: 06/02/2016 15:16   Dg Chest Portable 1 View  Result Date: 06/02/2016 CLINICAL DATA:  Weakness. EXAM: PORTABLE CHEST 1 VIEW COMPARISON:  Radiographs of June 01, 2016. FINDINGS: The heart size and mediastinal contours are within normal limits. Both lungs are clear. No pneumothorax or pleural effusion is noted. Atherosclerosis of thoracic aorta is noted. The visualized skeletal structures are unremarkable. IMPRESSION: Aortic atherosclerosis.  No acute cardiopulmonary abnormality seen. Electronically Signed   By: Marijo Conception, M.D.   On: 06/02/2016 16:35   Ct Head Code Stroke Wo Contrast  Result Date: 06/02/2016 CLINICAL DATA:  Code stroke.  Right-sided weakness, slurred speech, and confusion. EXAM: CT HEAD WITHOUT CONTRAST TECHNIQUE: Contiguous axial images were obtained from the base of the skull through the vertex without intravenous contrast. COMPARISON:  05/30/2013 FINDINGS: Brain: Chronic craniectomy changes involving the posterior skull and vertex with wires and clips which extend into the interhemispheric fissure and result in streak artifact, mildly limiting assessment of this region. Chronic encephalomalacia and gliosis in the superior aspects of both cerebral hemispheres are similar to the prior study. A small chronic infarct involving the anterior right basal ganglia/ white matter adjacent to the frontal horn is unchanged. There is no definite evidence of acute cortical infarct, intracranial hemorrhage, mass, midline shift, or extra-axial fluid collection. Vascular: No hyperdense vessel or unexpected calcification. Skull: Craniectomy changes and unchanged diffuse skull heterogeneity predominantly in the frontal region. Sinuses/Orbits: Unremarkable orbits. Visualized paranasal sinuses and mastoid air cells are clear. Other: None. ASPECTS [facility] Stroke Program Early CT Score) - Ganglionic level infarction (caudate, lentiform nuclei, internal capsule, insula, M1-M3 cortex): 7 - Supraganglionic infarction (M4-M6 cortex): 3 Total score (0-10 with 10 being normal): 10 IMPRESSION: 1. No evidence of acute intracranial abnormality. 2. ASPECTS is 10. 3. Chronic changes from prior meningioma resection. 4. Chronic right anterior basal ganglia infarct. These results were called by telephone at  the time of interpretation on 06/02/2016 at 2:44 pm to Dr. Jaynee Eagles, who verbally acknowledged these results. Electronically Signed   By: Logan Bores M.D.   On: 06/02/2016 14:46   Carotid Doppler   There is 1-39% bilateral ICA stenosis. Vertebral artery flow is antegrade.      PHYSICAL EXAM Pleasant middle aged 18 lady not in distress. . Afebrile.  Head is nontraumatic. Neck is supple without bruit.    Cardiac exam no murmur or gallop. Lungs are clear to auscultation. Distal pulses are well felt. NEUROLOGIC EXAM:  MENTAL STATUS: awake, alert and oriented to person, place and time, language fluent, comprehension intact, naming intact  CRANIAL NERVE: pupils equal and reactive to light, visual fields full to confrontation, mild facial asymmetry on the left, uvula midline, shoulder shrug symmetric, tongue midline.  MOTOR: Significant weakness in right and left lower extremities with only 1/5 strength hip extension and 0/5 at the hips ankles and toes. Tone is increase in the both lower extremities, with significant spasticity. 4/5 strength left upper extremity, fine finger movements decreased on left  SENSORY: normal and symmetric to light touch . No sensory level on the trunk COORDINATION: finger-nose-finger with mild ataxia on left, normal on right  REFLEXES: deep tendon reflexes BUE present and symmetric 2+, BLE brisk 2= but difficult to elicit  GAIT/STATION: Wheelchair-bound.  ASSESSMENT/PLAN Stacy Diaz is a 65 y.o. female with history of  previous meningioma resection with subsequent spastic paraparesis, seizures presenting with decreased left facial  sensation, right arm weakness. She did not receive IV t-PA due to resolving symptoms.   Possible TIA  MRI  Unable to perform d/t surgical slips from prior meningioma resection  CTA head and neck mild intra and extra cranial atherosclerosis  Carotid Doppler no significant stenosis  2D Echo  pending   LDL 79  HgbA1c 5.4  Lovenox 40 mg sq daily for VTE prophylaxis  Diet Heart Room service appropriate? Yes; Fluid consistency: Thin clopidogrel 75 mg daily prior to admission, now on clopidogrel 75 mg daily. Continue plavix at discharge.  Patient counseled to be compliant with her antithrombotic medications  Ongoing aggressive stroke risk factor management  Therapy  recommendations:  HH OT  Disposition:  Return home with [place] therapies (lives w/ husband who provides care, w/c bound)  Seizure disorder   On percocet, increased dosing following fall with rib fx. Advised to decrease dosing back to baseline  On trileptal. Check level  Difficult to control with recurrent episodes. 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 Wheelchair bound at [facility] day # [address] for Pager information 06/03/2016 1:02 PM  I have personally examined this patient, reviewed notes, independently viewed imaging studies, participated in medical decision making and plan of care.ROS completed by me personally and pertinent positives fully documented  I have made any additions or clarifications directly to the above note. Agree with note above.  Patient presented with transient episode of right-sided weakness, numbness, confusion and speech difficulties of unclear etiology. Possibilities include left hemispheric TIA  Versus  atypical complex partial seizure. Recommend check Trileptal level and EEG. Continue Plavix for stroke prevention. Long discussion with patient and her husband at the bedside and answered questions. Greater than 50% time during this 35 minute visit was spent on counseling and cognition of care about  seizures and TIA Antony Contras, MD Medical Director [place] Pager: [phone] 06/03/2016 1:27 PM  To contact Stroke Continuity provider, please refer to [email or web address] After hours, contact General Neurology

## 2016-06-04 ENCOUNTER — Other Ambulatory Visit (HOSPITAL_COMMUNITY): Payer: PPO

## 2016-06-04 ENCOUNTER — Observation Stay (HOSPITAL_BASED_OUTPATIENT_CLINIC_OR_DEPARTMENT_OTHER): Payer: PPO

## 2016-06-04 ENCOUNTER — Observation Stay (HOSPITAL_COMMUNITY): Payer: PPO

## 2016-06-04 DIAGNOSIS — G459 Transient cerebral ischemic attack, unspecified: Secondary | ICD-10-CM | POA: Diagnosis not present

## 2016-06-04 DIAGNOSIS — R531 Weakness: Secondary | ICD-10-CM | POA: Diagnosis not present

## 2016-06-04 DIAGNOSIS — M6289 Other specified disorders of muscle: Secondary | ICD-10-CM | POA: Diagnosis not present

## 2016-06-04 DIAGNOSIS — I10 Essential (primary) hypertension: Secondary | ICD-10-CM | POA: Diagnosis not present

## 2016-06-04 DIAGNOSIS — Z8673 Personal history of transient ischemic attack (TIA), and cerebral infarction without residual deficits: Secondary | ICD-10-CM | POA: Diagnosis not present

## 2016-06-04 DIAGNOSIS — G40209 Localization-related (focal) (partial) symptomatic epilepsy and epileptic syndromes with complex partial seizures, not intractable, without status epilepticus: Secondary | ICD-10-CM | POA: Diagnosis not present

## 2016-06-04 DIAGNOSIS — Z9889 Other specified postprocedural states: Secondary | ICD-10-CM | POA: Diagnosis not present

## 2016-06-04 LAB — VAS US CAROTID
LCCADDIAS: -14 cm/s
LCCADSYS: -75 cm/s
LCCAPSYS: 81 cm/s
LEFT ECA DIAS: -9 cm/s
LEFT VERTEBRAL DIAS: 10 cm/s
LICADDIAS: -25 cm/s
LICADSYS: -83 cm/s
LICAPSYS: -68 cm/s
Left CCA prox dias: 11 cm/s
Left ICA prox dias: -12 cm/s
RCCAPSYS: 69 cm/s
RIGHT ECA DIAS: -10 cm/s
RIGHT VERTEBRAL DIAS: 9 cm/s
Right CCA prox dias: 13 cm/s
Right cca dist sys: -73 cm/s

## 2016-06-04 LAB — ECHOCARDIOGRAM COMPLETE
HEIGHTINCHES: 63 in
WEIGHTICAEL: 3238.4 [oz_av]

## 2016-06-04 LAB — HEMOGLOBIN A1C
HEMOGLOBIN A1C: 5.3 % (ref 4.8–5.6)
MEAN PLASMA GLUCOSE: 105 mg/dL

## 2016-06-04 NOTE — Progress Notes (Signed)
CM spoke at length to Mr and Mrs Dovalina about needs at discharge. Pt already has several pieces of equipment at home but husband states she needs a manual wheelchair. He states she has trouble recently guiding the electric wheelchair and has been running into things. They also are requesting some HH PT/OT/Aide to help strengthen patient. Will give information to MD.

## 2016-06-04 NOTE — Progress Notes (Signed)
*  PRELIMINARY RESULTS* Echocardiogram 2D Echocardiogram has been performed.  Stacy Diaz 06/04/2016, 4:58 PM

## 2016-06-04 NOTE — Progress Notes (Signed)
PROGRESS NOTE    Stacy Diaz  [id number] DOB: 02-Feb-1951 DOA: 06/02/2016 PCP: Joycelyn Man, MD    Brief Narrative:  Stacy Diaz is a 65 y.o. female with medical history significant for meningioma resection in 1998 resulting in bilateral lower extremity paralysis, history of strokelike symptoms without definitive diagnosis in 2014, apparent formal diagnosis of CVA in 2016, seizure disorder with apparent recurrent seizures multiple adjustments in AEDs over the years, history of breast cancer, lipidemia, hypertension and recent treatment for pansensitive Escherichia coli UTI last month. Of note patient fell out of her wheelchair recently an x-ray performed yesterday revealed 2 right rib fractures.  Patient was in her usual state of health and actually had a friend over this morning and was doing well until about 11:30 AM when she developed abrupt onset of right-sided facial drooping and right-sided weakness w/ dysarthria. EMS was called and patient was brought to the ER. A Code stroke called but since symptoms had resolved upon arrival the code stroke was canceled. Several hours later with the patient still in the ER her symptoms reemerged. Not a candidate for TPA due to mild severity of symptoms. Initial CT head negative. Not a candidate for MRI secondary to intracranial clips from prior meningioma resection. Neurology following.  In discussion with the patient and her husband patient reports had done well post resection and had been stable on Tegretol for many years. In 2013 she followed up with neurosurgery who told her she was stable and did not need to return. She eventually followed up with neurology who weaned and discontinued her Tegretol. Subsequently thereafter in 2014 she had a presentation with left-sided hemiparesis that lasted 2-3 days. She was sent to a skilled nursing facility but had to return 2 days later to the ER for worsening symptoms and remained in the hospital  5 days. Since that time she has had various adjustments in her antiepileptic drugs and she is primarily followed by Dr. Leonie Man. During that admission in 2014 a definitive diagnosis was never declared with husband stating that the neurologist felt either TIA/CVA or atypical seizures could be culprit. Since that time the patient has apparently had breakthrough seizures and has had adjustments in her anti-epileptic medications. Most recently she was changed from Vimpat to Trileptal. Of note she takes Ativan prn as well as nocturnal Valium. In mid-September she was treated with Cipro for a pansensitive Escherichia coli infection. In regards to the recent rib fracture she reports she is on Percocet which as been helpful in pain management.   Assessment & Plan:   Principal Problem:   Acute right-sided weakness Active Problems:   Hyperlipidemia   Essential hypertension   Recent UTI (urinary tract infection)   Partial symptomatic epilepsy with complex partial seizures, not intractable, without status epilepticus ([place])   History of stroke   History of resection of meningioma   Right rib fractures   Acute right-sided weakness/history of prior stroke -Patient presents with waxing and waning acute right-sided weakness with facial drooping and dysarthria-currently back to baseline -Appreciate neurology assistance -TIA/ischemic stroke order set initiated -No MRI secondary to intracranial clips from prior meningioma resection -History of similar symptoms in past that were never clearly elucidated as to etiology: ?? TIA/stroke versus atypical seizures -Carotid duplex showed no stenosis - Echocardiogram is pending -Hemoglobin A1c pending -Antiplatelet with preadmission Plavix -PT/OT/SLP noting at baseline due to problems from lower extremity paresis from prior meningioma resection is wheelchair-bound at baseline - can discharge when echocardiogram is  performed and resulted    Partial symptomatic  epilepsy with complex partial seizures, not intractable, without status epilepticus  -Recent adjustments in AEDs but w/ reported normal therapeutic levels as early as last week -Repeating Trileptal level- wnl -Monitor for seizure activity - EEG: diffuse cerebral dysfunction that is non-specific in etiology and can be seen with hypoxic/ischemic injury, toxic/metabolic encephalopathies, neurodegenerative disorders, or medication effect. In the correct clinical setting, it can also be seen with excessive drowsiness -Uncertain if presenting symptoms more reflective of complex seizure - note pt did receive a floroquinolone in mid September for treatment of UTI; this has been at least 3 weeks ago and uncertain if this may have potentiated any seizure activity noting this class of drugs can lower seizure threshold      Recent UTI (urinary tract infection) -Repeat urinalysis and culture to ensure eradication of previous organism especially with presentation of altered mentation and strokelike symptoms - moderate LE, few bacteria but squamous cells noted - awaiting culture- 20k colonies of Pseudomonas    History of resection of meningioma -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 and is wheelchair-bound -Has surgical clips in place so unable to pursue MRIs -Continue preadmission benzodiazepines and skeletal muscular relaxers    Right rib fractures -Continue preadmission Percocet with current pain levels being reported as controlled    Hyperlipidemia -Continue Lipitor    Essential hypertension -Initial blood pressure not well controlled at 172/87 with current blood pressure controlled -Continue preadmission Tenormin      DVT prophylaxis: Lovenox  Code Status: Full  Family Communication: Husband bedside Disposition Plan: Anticipate discharge back to preadmission home environment once medically stable and stroke workup is complete- likely in am   Consultants:    Neurology  Procedures:   None  Antimicrobials:   none    Subjective: Patient seen and evaluated. She is feeling significantly better today than she did yesterday.  She reports that feels ready to go home but is wondering about her heart ultrasound and her trileptal level.  At time of exam trileptal level not back yet but later did result to be within drug limits.  Patient husband wants to ensure home health services are set up prior to discharge.  Objective: Vitals:   06/04/16 0531 06/04/16 1038 06/04/16 1403 06/04/16 1758  BP: (!) 155/88 114/65 (!) 108/53 (P) 121/61  Pulse: 76 81 67 (P) 62  Resp: 18 19 19  (P) 18  Temp: 98.4 F (36.9 C) 98.2 F (36.8 C) 98.6 F (37 C) (P) 97.7 F (36.5 C)  TempSrc: Oral Oral Oral (P) Oral  SpO2: 100% 98% 100% (P) 100%  Weight:      Height:       No intake or output data in the 24 hours ending 06/04/16 1852 Filed Weights   06/02/16 2000  Weight: 91.8 kg (202 lb 6.4 oz)    Examination:  General exam: Appears calm and comfortable  Respiratory system: Clear to auscultation. Respiratory effort normal. Cardiovascular system: S1 & S2 heard, RRR. No JVD, murmurs, rubs, gallops or clicks. No pedal edema. Gastrointestinal system: Abdomen is nondistended, soft and nontender. No organomegaly or masses felt. Normal bowel sounds heard. Central nervous system: Alert and oriented to person, place and situation- not to time. Extremities: Symmetric 5 x 5 power in upper extremities, no power in lower extremities Skin: No rashes, lesions or ulcers Psychiatry: Mood & affect appropriate.     Data Reviewed: I have personally reviewed following labs and imaging studies  CBC:  Recent Labs Lab 06/02/16 1431 06/02/16 1508 06/03/16 0408  WBC  --  7.9 6.7  NEUTROABS  --  5.0 3.4  HGB 16.0* 15.1* 14.2  HCT 47.0* 45.3 42.6  MCV  --  89.5 88.9  PLT  --  205 [id number]   Basic Metabolic Panel:  Recent Labs Lab 06/02/16 1430 06/02/16 1431  06/03/16 0408  NA 135 134* 134*  K 4.9 4.6 3.8  CL 102 99* 101  CO2 22  --  22  GLUCOSE 103* 109* 89  BUN 11 15 8   CREATININE 0.68 0.50 0.49  CALCIUM 9.5  --  9.4   GFR: Estimated Creatinine Clearance: 75.5 mL/min (by C-G formula based on SCr of 0.49 mg/dL). Liver Function Tests:  Recent Labs Lab 06/02/16 1430  AST 32  ALT 22  ALKPHOS 86  BILITOT 1.4*  PROT 6.7  ALBUMIN 4.1   No results for input(s): LIPASE, AMYLASE in the last 168 hours.  Recent Labs Lab 06/02/16 1905  AMMONIA 25   Coagulation Profile:  Recent Labs Lab 06/02/16 1502  INR 1.03   Cardiac Enzymes: No results for input(s): CKTOTAL, CKMB, CKMBINDEX, TROPONINI in the last 168 hours. BNP (last 3 results) No results for input(s): PROBNP in the last 8760 hours. HbA1C:  Recent Labs  06/02/16 1508  HGBA1C 5.3   CBG: No results for input(s): GLUCAP in the last 168 hours. Lipid Profile:  Recent Labs  06/03/16 0408  CHOL 154  HDL 55  LDLCALC 79  TRIG 99  CHOLHDL 2.8   Thyroid Function Tests:  Recent Labs  06/02/16 1852  TSH 0.790   Anemia Panel:  Recent Labs  06/02/16 1905  VITAMINB12 435   Sepsis Labs: No results for input(s): PROCALCITON, LATICACIDVEN in the last 168 hours.  Recent Results (from the past 240 hour(s))  Urine culture     Status: Abnormal (Preliminary result)   Collection Time: 06/02/16  7:36 PM  Result Value Ref Range Status   Specimen Description URINE, CLEAN CATCH  Final   Special Requests NONE  Final   Culture 20,000 COLONIES/mL PSEUDOMONAS AERUGINOSA (A)  Final   Report Status PENDING  Incomplete         Radiology Studies: No results found.      Scheduled Meds: . atenolol  25 mg Oral QHS  . atenolol  50 mg Oral Daily  . atorvastatin  10 mg Oral BH-q7a  . baclofen  20 mg Oral QID  . clopidogrel  75 mg Oral Q breakfast  . diazepam  5 mg Oral QHS  . docusate sodium  100 mg Oral BID  . enoxaparin (LOVENOX) injection  40 mg Subcutaneous  Q24H  . letrozole  2.5 mg Oral QHS  . LORazepam  1 mg Oral BID  . methocarbamol  500 mg Oral TID  . multivitamin with minerals  1 tablet Oral BH-q7a  . Oxcarbazepine  300 mg Oral BID  . oxyCODONE-acetaminophen  2 tablet Oral TID   Continuous Infusions:    LOS: 0 days    Time spent: 35 minutes    Newman Pies, MD [facility] Pager [phone]  If 7PM-7AM, please contact night-coverage [email or web address] Password [email or web address] 06/04/2016, 6:52 PM

## 2016-06-04 NOTE — Progress Notes (Signed)
STROKE TEAM PROGRESS NOTE   HISTORY OF PRESENT ILLNESS (per record) Stacy Diaz is an 65 y.o. female with history of previous meningioma resection with subsequent spastic paraparesis, seizures.  Today at 1445 noted confusion, left facial decreased sensation, right arm weakness. On arrival she had improved symptoms on the right and was not confused. CT of head was obtained and CTA was also obtained both negative for acute event. At her baseline she has trouble angulating for the past 4 years but could bear weight on her legs but needs help with transfers. Patient has a history of seizures and is on Tegretol. She states she fell in the bathroom yesterday but denies decreased LOC or hitting her head. She fractured her ribs and says she is in pain and has been increasingly taken percocet for the rib pain.   Patient has had multiple left-sided episodes of weakness but none on the right. Husband at bedside. She developed left arm weakness and facial droop on the morning of 08/25/2012. She had multiple episodes in the past similar to the episode above but this one had been persistent. MRI was not an option since she had history of no clips to 2 meningioma resection. Since her surgery she has had trouble with weakness from waist down. She was last known well on 06/02/2016 at 14:45. Patient was not administered IV t-PA secondary to  resolving symptoms. She was admitted for further evaluation and treatment.   SUBJECTIVE (INTERVAL HISTORY) Her pastor was at the bedside.  Overall she feels her condition is stable.   OBJECTIVE Temp:  [97.8 F (36.6 C)-98.6 F (37 C)] 98.6 F (37 C) (10/19 1403) Pulse Rate:  [67-100] 67 (10/19 1403) Cardiac Rhythm: Normal sinus rhythm;Bundle branch block (10/19 0700) Resp:  [16-19] 19 (10/19 1403) BP: (108-155)/(53-88) 108/53 (10/19 1403) SpO2:  [94 %-100 %] 100 % (10/19 1403)  CBC:   Recent Labs Lab 06/02/16 1508 06/03/16 0408  WBC 7.9 6.7  NEUTROABS 5.0 3.4   HGB 15.1* 14.2  HCT 45.3 42.6  MCV 89.5 88.9  PLT 205 [id number]    Basic Metabolic Panel:   Recent Labs Lab 06/02/16 1430 06/02/16 1431 06/03/16 0408  NA 135 134* 134*  K 4.9 4.6 3.8  CL 102 99* 101  CO2 22  --  22  GLUCOSE 103* 109* 89  BUN 11 15 8   CREATININE 0.68 0.50 0.49  CALCIUM 9.5  --  9.4    Lipid Panel:     Component Value Date/Time   CHOL 154 06/03/2016 0408   TRIG 99 06/03/2016 0408   HDL 55 06/03/2016 0408   CHOLHDL 2.8 06/03/2016 0408   VLDL 20 06/03/2016 0408   LDLCALC 79 06/03/2016 0408   HgbA1c:  Lab Results  Component Value Date   HGBA1C 5.3 06/02/2016   Urine Drug Screen:     Component Value Date/Time   LABOPIA NONE DETECTED 06/02/2016 1936   COCAINSCRNUR NONE DETECTED 06/02/2016 1936   LABBENZ POSITIVE (A) 06/02/2016 1936   AMPHETMU NONE DETECTED 06/02/2016 1936   THCU NONE DETECTED 06/02/2016 1936   LABBARB NONE DETECTED 06/02/2016 1936      IMAGING  Ct Angio Head W Or Wo Contrast  Result Date: 06/02/2016 CLINICAL DATA:  Right arm weakness and slurred speech. Prior meningioma resection. EXAM: CT ANGIOGRAPHY HEAD AND NECK TECHNIQUE: Multidetector CT imaging of the head and neck was performed using the standard protocol during bolus administration of intravenous contrast. Multiplanar CT image reconstructions and MIPs were obtained to  evaluate the vascular anatomy. Carotid stenosis measurements (when applicable) are obtained utilizing NASCET criteria, using the distal internal carotid diameter as the denominator. CONTRAST:  50 mL Isovue 370 COMPARISON:  Head and neck CTA 08/26/2012 FINDINGS: CTA NECK FINDINGS Aortic arch: 3 vessel aortic arch with minimal atherosclerosis. Arch vessel origins are widely patent. Right carotid system: Patent with focal calcified plaque in the proximal ICA. No stenosis. Left carotid system: Patent with minimal non stenotic plaque at the carotid bifurcation. Vertebral arteries: Patent without evidence of stenosis. The  left vertebral artery is dominant. Both vertebral arteries are somewhat tortuous proximally. Skeleton: Moderately advanced cervical facet arthrosis, greater on the left. Cervical disc degeneration most advanced at C5-6. Other neck: No evidence of acute abnormality or mass. Upper chest: Unremarkable. Review of the MIP images confirms the above findings CTA HEAD FINDINGS Anterior circulation: Internal carotid arteries are widely patent from skullbase to carotid termini. ACAs and MCAs are patent without evidence of major branch occlusion or significant proximal stenosis. Right A2 segment again noted to be dominant. Mild branch vessel irregularity. No aneurysm identified. Posterior circulation: Intracranial vertebral arteries are patent to the basilar with the left being dominant. Patent PICA, AICA, and SCA origins. Basilar artery is widely patent. Posterior communicating arteries are not identified. PCAs are patent with mild asymmetric attenuation of distal right PCA branch vessels but no evidence of significant proximal stenosis. No aneurysm identified. Venous sinuses: Not well evaluated due to arterial phase contrast timing. Anatomic variants: None of significance. Review of the MIP images confirms the above findings IMPRESSION: Very mild intracranial and extracranial atherosclerosis without evidence of major vessel occlusion or significant stenosis. These results were called by telephone at the time of interpretation on 06/02/2016 at 3:07 pm to Dr. Jaynee Eagles, who verbally acknowledged these results. Electronically Signed   By: Logan Bores M.D.   On: 06/02/2016 15:16   Ct Angio Neck W Or Wo Contrast  Result Date: 06/02/2016 CLINICAL DATA:  Right arm weakness and slurred speech. Prior meningioma resection. EXAM: CT ANGIOGRAPHY HEAD AND NECK TECHNIQUE: Multidetector CT imaging of the head and neck was performed using the standard protocol during bolus administration of intravenous contrast. Multiplanar CT image  reconstructions and MIPs were obtained to evaluate the vascular anatomy. Carotid stenosis measurements (when applicable) are obtained utilizing NASCET criteria, using the distal internal carotid diameter as the denominator. CONTRAST:  50 mL Isovue 370 COMPARISON:  Head and neck CTA 08/26/2012 FINDINGS: CTA NECK FINDINGS Aortic arch: 3 vessel aortic arch with minimal atherosclerosis. Arch vessel origins are widely patent. Right carotid system: Patent with focal calcified plaque in the proximal ICA. No stenosis. Left carotid system: Patent with minimal non stenotic plaque at the carotid bifurcation. Vertebral arteries: Patent without evidence of stenosis. The left vertebral artery is dominant. Both vertebral arteries are somewhat tortuous proximally. Skeleton: Moderately advanced cervical facet arthrosis, greater on the left. Cervical disc degeneration most advanced at C5-6. Other neck: No evidence of acute abnormality or mass. Upper chest: Unremarkable. Review of the MIP images confirms the above findings CTA HEAD FINDINGS Anterior circulation: Internal carotid arteries are widely patent from skullbase to carotid termini. ACAs and MCAs are patent without evidence of major branch occlusion or significant proximal stenosis. Right A2 segment again noted to be dominant. Mild branch vessel irregularity. No aneurysm identified. Posterior circulation: Intracranial vertebral arteries are patent to the basilar with the left being dominant. Patent PICA, AICA, and SCA origins. Basilar artery is widely patent. Posterior communicating arteries are not  identified. PCAs are patent with mild asymmetric attenuation of distal right PCA branch vessels but no evidence of significant proximal stenosis. No aneurysm identified. Venous sinuses: Not well evaluated due to arterial phase contrast timing. Anatomic variants: None of significance. Review of the MIP images confirms the above findings IMPRESSION: Very mild intracranial and  extracranial atherosclerosis without evidence of major vessel occlusion or significant stenosis. These results were called by telephone at the time of interpretation on 06/02/2016 at 3:07 pm to Dr. Jaynee Eagles, who verbally acknowledged these results. Electronically Signed   By: Logan Bores M.D.   On: 06/02/2016 15:16   Dg Chest Portable 1 View  Result Date: 06/02/2016 CLINICAL DATA:  Weakness. EXAM: PORTABLE CHEST 1 VIEW COMPARISON:  Radiographs of June 01, 2016. FINDINGS: The heart size and mediastinal contours are within normal limits. Both lungs are clear. No pneumothorax or pleural effusion is noted. Atherosclerosis of thoracic aorta is noted. The visualized skeletal structures are unremarkable. IMPRESSION: Aortic atherosclerosis.  No acute cardiopulmonary abnormality seen. Electronically Signed   By: Marijo Conception, M.D.   On: 06/02/2016 16:35   Ct Head Code Stroke Wo Contrast  Result Date: 06/02/2016 CLINICAL DATA:  Code stroke. Right-sided weakness, slurred speech, and confusion. EXAM: CT HEAD WITHOUT CONTRAST TECHNIQUE: Contiguous axial images were obtained from the base of the skull through the vertex without intravenous contrast. COMPARISON:  05/30/2013 FINDINGS: Brain: Chronic craniectomy changes involving the posterior skull and vertex with wires and clips which extend into the interhemispheric fissure and result in streak artifact, mildly limiting assessment of this region. Chronic encephalomalacia and gliosis in the superior aspects of both cerebral hemispheres are similar to the prior study. A small chronic infarct involving the anterior right basal ganglia/ white matter adjacent to the frontal horn is unchanged. There is no definite evidence of acute cortical infarct, intracranial hemorrhage, mass, midline shift, or extra-axial fluid collection. Vascular: No hyperdense vessel or unexpected calcification. Skull: Craniectomy changes and unchanged diffuse skull heterogeneity predominantly in  the frontal region. Sinuses/Orbits: Unremarkable orbits. Visualized paranasal sinuses and mastoid air cells are clear. Other: None. ASPECTS [facility] Stroke Program Early CT Score) - Ganglionic level infarction (caudate, lentiform nuclei, internal capsule, insula, M1-M3 cortex): 7 - Supraganglionic infarction (M4-M6 cortex): 3 Total score (0-10 with 10 being normal): 10 IMPRESSION: 1. No evidence of acute intracranial abnormality. 2. ASPECTS is 10. 3. Chronic changes from prior meningioma resection. 4. Chronic right anterior basal ganglia infarct. These results were called by telephone at the time of interpretation on 06/02/2016 at 2:44 pm to Dr. Jaynee Eagles, who verbally acknowledged these results. Electronically Signed   By: Logan Bores M.D.   On: 06/02/2016 14:46   Carotid Doppler   There is 1-39% bilateral ICA stenosis. Vertebral artery flow is antegrade.    EEG :  awake and asleep EEG is abnormal due to mild diffuse slowing of the waking background  PHYSICAL EXAM Pleasant middle aged Caucasian lady not in distress. . Afebrile. Head is nontraumatic. Neck is supple without bruit.    Cardiac exam no murmur or gallop. Lungs are clear to auscultation. Distal pulses are well felt. NEUROLOGIC EXAM:  MENTAL STATUS: awake, alert and oriented to person, place and time, language fluent, comprehension intact, naming intact  CRANIAL NERVE: pupils equal and reactive to light, visual fields full to confrontation, mild facial asymmetry on the left, uvula midline, shoulder shrug symmetric, tongue midline.  MOTOR: Significant weakness in right and left lower extremities with only 1/5 strength hip extension and  0/5 at the hips ankles and toes. Tone is increase in the both lower extremities, with significant spasticity. 4/5 strength left upper extremity, fine finger movements decreased on left  SENSORY: normal and symmetric to light touch . No sensory level on the trunk COORDINATION: finger-nose-finger with mild ataxia on  left, normal on right  REFLEXES: deep tendon reflexes BUE present and symmetric 2+, BLE brisk 2= but difficult to elicit  GAIT/STATION: Wheelchair-bound.  ASSESSMENT/PLAN Stacy Diaz is a 65 y.o. female with history of  previous meningioma resection with subsequent spastic paraparesis, seizures presenting with decreased left facial  sensation, right arm weakness. She did not receive IV t-PA due to resolving symptoms.   Possible TIA  MRI  Unable to perform d/t surgical slips from prior meningioma resection  CTA head and neck mild intra and extra cranial atherosclerosis  Carotid Doppler no significant stenosis  2D Echo  pending   LDL 79  HgbA1c 5.4  Lovenox 40 mg sq daily for VTE prophylaxis Diet Heart Room service appropriate? Yes; Fluid consistency: Thin clopidogrel 75 mg daily prior to admission, now on clopidogrel 75 mg daily. Continue plavix at discharge.  Patient counseled to be compliant with her antithrombotic medications  Ongoing aggressive stroke risk factor management  Therapy recommendations:  HH OT  Disposition:  Return home with [place] therapies (lives w/ husband who provides care, w/c bound)  Seizure disorder   On percocet, increased dosing following fall with rib fx. Advised to decrease dosing back to baseline  On trileptal. Check level  Difficult to control with recurrent episodes. 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 Wheelchair bound at [facility] day # [place] for Pager information 06/04/2016 2:05 PM  I have personally examined this patient, reviewed notes, independently viewed imaging studies, participated in medical decision making and plan of care.ROS completed by me personally and pertinent positives fully documented  I have made any additions or clarifications directly to the above note. Agree with note above.  Patient presented with transient episode of right-sided weakness, numbness, confusion and speech difficulties of unclear etiology. Possibilities include left hemispheric TIA  Versus  atypical complex partial seizure Trileptal level is  EEG. Continue Plavix for stroke prevention. Long discussion with patient   at the bedside and answered questions. Greater than 50% time during this 15 minute visit was spent on counseling and cognition of care about seizures and TIA Antony Contras, MD Medical Director [place] Pager: [phone] 06/04/2016 2:05 PM  To contact Stroke Continuity provider, please refer to [email or web address] After hours, contact General Neurology

## 2016-06-04 NOTE — Care Management Note (Signed)
Case Management Note  Patient Details  Name: Stacy Diaz MRN: [id number] Date of Birth: Sep 07, 1950  Subjective/Objective:                    Action/Plan: Plan is for patient to discharge home when work up complete with [facility] services. CM met with patient and husband yesterday and provided them a list of [place] agencies that [employer] uses. They selected [facility]. Santiago Glad with [facility] notified. Pt also with orders for wheelchair. Jermaine with [facility] DME notified and will deliver the DME to the room. Will update the bedside RN.   Expected Discharge Date:  06/03/16               Expected Discharge Plan:  [place]  In-House Referral:     Discharge planning Services  CM Consult  Post Acute Care Choice:  Durable Medical Equipment, Home Health Choice offered to:  Patient, Spouse  DME Arranged:  Wheelchair manual DME Agency:  [place]:  PT, OT, Nurse's Aide [place] Agency:  [facility]  Status of Service:  Completed, signed off  If discussed at [place] of Stay Meetings, dates discussed:    Additional Comments:  [name], RN 06/04/2016, 4:08 PM

## 2016-06-04 NOTE — Discharge Summary (Signed)
Physician Discharge Summary  LATESSA PEROVICH [id number] DOB: 09-03-1950 DOA: 06/02/2016  PCP: Joycelyn Man, MD  Admit date: 06/02/2016 Discharge date: 06/05/2016  Admitted From:  Home Disposition:  Home  Recommendations for Outpatient Follow-up:  1. Follow up with PCP in 1-2 weeks 2. Schedule follow up with [facility] 3. Monitor signs of signs of UTI 4. Continue taking Plavix and lipitor as prescribed  Home Health: Yes- Home Health PT/OT/Aide Equipment/Devices: Manual wheelchair  Discharge Condition: Stable CODE STATUS:Full code Diet recommendation: Heart Healthy / Carb Modified   Brief/Interim Summary: Verba L Hastingsis a 65 y.o.femalewith medical history significant for meningioma resection in 1998 resulting in bilateral lower extremity paralysis, history of strokelike symptoms without definitive diagnosis in 2014, apparent formal diagnosis of CVA in 2016, seizure disorder with apparent recurrent seizures multiple adjustments in [place] the years, history of breast cancer, lipidemia, hypertension and recent treatment for pansensitive Escherichia coli UTI last month. Of note patient fell out of her wheelchair recently an x-ray performed yesterday revealed 2 right rib fractures.  Patient was in her usual state of health and actually had a friend over this morning and was doing well until about 11:30 AM when she developed abrupt onset of right-sided facial drooping and right-sided weakness w/dysarthria. EMS was called and patient was brought to theER. ACode stroke called but since symptoms had resolved upon arrival the codestroke was canceled. Several hours later with the patient still in the ER her symptoms reemerged. Not a candidate for TPA due to mild severity of symptoms. Initial CT head negative. Not a candidate for MRI secondary to intracranial clips from prior meningioma resection. Neurology following.  In discussion with the patient and her husband patient reports  had done well post resection and had been stable on Tegretol for many years. In 2013 she followed up with neurosurgery who told her she was stable and did not need to return. She eventually followed up with neurology who weaned and discontinued her Tegretol. Subsequently thereafter in 2014 she had a presentation with left-sided hemiparesis that lasted 2-3 days. She was sent to a skilled nursing facility but had to return 2 days later to the ER for worsening symptoms and remained in the hospital 5 days. Since that time she has had various adjustments in her antiepileptic drugs and she is primarily followed by Dr. Leonie Man. During thatadmission in 2014 a definitive diagnosis was never declared with husband stating that the neurologist felt either TIA/CVA or atypical seizures could be culprit. Since that time the patient has apparently had breakthrough seizures and has had adjustments in her anti-epileptic medications. Most recently she was changed from [place] toTrileptal. Of note she takes Ativan prnas well as nocturnal Valium. In mid-September she was treated with Cipro for a pansensitive Escherichia coli infection. In regards to the recent rib fracture she reports she is on Percocet which as been helpful in pain management.  Urine Culture grew 20k colonies of Pseudomonas but likely just colonization given low colony count.     Discharge Diagnoses:  Principal Problem:   Right sided weakness Active Problems:   Hyperlipidemia   Essential hypertension   Recent UTI (urinary tract infection)   Partial symptomatic epilepsy with complex partial seizures, not intractable, without status epilepticus ([place])   History of stroke   History of resection of meningioma   Right rib fractures    Discharge Instructions  Discharge Instructions    Ambulatory referral to Neurology    Complete by:  As directed    An  appointment is requested in approximately: 6 weeks   Call MD for:  difficulty breathing, headache or  visual disturbances    Complete by:  As directed    Call MD for:  extreme fatigue    Complete by:  As directed    Call MD for:  persistant dizziness or light-headedness    Complete by:  As directed    Call MD for:  persistant nausea and vomiting    Complete by:  As directed    Call MD for:  severe uncontrolled pain    Complete by:  As directed    Call MD for:  temperature >100.4    Complete by:  As directed    Diet - low sodium heart healthy    Complete by:  As directed    Increase activity slowly    Complete by:  As directed        Medication List    STOP taking these medications   ciprofloxacin 500 MG tablet Commonly known as:  CIPRO   fluconazole 150 MG tablet Commonly known as:  DIFLUCAN     TAKE these medications   atenolol 50 MG tablet Commonly known as:  TENORMIN 1 tablet in the morning....... half a tab at bedtime What changed:  how much to take  how to take this  when to take this  additional instructions   atorvastatin 10 MG tablet Commonly known as:  LIPITOR Take 1 tablet (10 mg total) by mouth daily. What changed:  when to take this   baclofen 20 MG tablet Commonly known as:  LIORESAL TAKE 1 TABLET (20 MG TOTAL) BY MOUTH 4 (FOUR) TIMES DAILY.   clopidogrel 75 MG tablet Commonly known as:  PLAVIX Take 1 tablet (75 mg total) by mouth daily with breakfast.   diazepam 5 MG tablet Commonly known as:  VALIUM TAKE 1 TABLET BY MOUTH AT BEDTIME What changed:  See the new instructions.   docusate sodium 100 MG capsule Commonly known as:  COLACE Take 100 mg by mouth 2 (two) times daily.   letrozole 2.5 MG tablet Commonly known as:  FEMARA Take 1 tablet (2.5 mg total) by mouth daily. What changed:  when to take this   LORazepam 1 MG tablet Commonly known as:  ATIVAN Take 1 tablet (1 mg total) by mouth 2 (two) times daily as needed. for anxiety What changed:  when to take this  additional instructions   Melatonin 3 MG Tabs Take 1 tablet  (3 mg total) by mouth at bedtime.   methocarbamol 500 MG tablet Commonly known as:  ROBAXIN Take 1 tablet (500 mg total) by mouth 3 (three) times daily.   multivitamin with minerals Tabs tablet Take 1 tablet by mouth every morning.   OVER THE COUNTER MEDICATION Apply 1 application topically daily as needed (pain). Aspercreme with 5% lidocaine   Oxcarbazepine 300 MG tablet Commonly known as:  TRILEPTAL Take 0.5 tablets (150 mg total) by mouth 2 (two) times daily. Start with 1/2 tablet twice daily x 2 weeks then one tablet twice daily What changed:  how much to take  additional instructions   oxyCODONE-acetaminophen 5-325 MG tablet Commonly known as:  PERCOCET/ROXICET Take 2 tablets by mouth 3 (three) times daily.   polyethylene glycol packet Commonly known as:  MIRALAX / GLYCOLAX Take 17 g by mouth daily as needed (for constipation). Mix with liquid and drink   zinc oxide 11.3 % Crea cream Commonly known as:  BALMEX Apply 1 application topically  at bedtime as needed (IRRITATION).      Follow-up Information    TODD,JEFFREY [name], MD. Schedule an appointment as soon as possible for a visit in 2 week(s).   Specialty:  Family Medicine Contact information: [place] [address] [phone]        SETHI,PRAMOD, MD. Schedule an appointment as soon as possible for a visit in 6 week(s).   Specialties:  Neurology, Radiology Contact information: [address] [place] [address] [phone]          No Known Allergies  Consultations:  Neurology  PT/OT   Procedures/Studies: Ct Angio Head W Or Wo Contrast  Result Date: 06/02/2016 CLINICAL DATA:  Right arm weakness and slurred speech. Prior meningioma resection. EXAM: CT ANGIOGRAPHY HEAD AND NECK TECHNIQUE: Multidetector CT imaging of the head and neck was performed using the standard protocol during bolus administration of intravenous contrast. Multiplanar CT image  reconstructions and MIPs were obtained to evaluate the vascular anatomy. Carotid stenosis measurements (when applicable) are obtained utilizing NASCET criteria, using the distal internal carotid diameter as the denominator. CONTRAST:  50 mL Isovue 370 COMPARISON:  Head and neck CTA 08/26/2012 FINDINGS: CTA NECK FINDINGS Aortic arch: 3 vessel aortic arch with minimal atherosclerosis. Arch vessel origins are widely patent. Right carotid system: Patent with focal calcified plaque in the proximal ICA. No stenosis. Left carotid system: Patent with minimal non stenotic plaque at the carotid bifurcation. Vertebral arteries: Patent without evidence of stenosis. The left vertebral artery is dominant. Both vertebral arteries are somewhat tortuous proximally. Skeleton: Moderately advanced cervical facet arthrosis, greater on the left. Cervical disc degeneration most advanced at C5-6. Other neck: No evidence of acute abnormality or mass. Upper chest: Unremarkable. Review of the MIP images confirms the above findings CTA HEAD FINDINGS Anterior circulation: Internal carotid arteries are widely patent from skullbase to carotid termini. ACAs and MCAs are patent without evidence of major branch occlusion or significant proximal stenosis. Right A2 segment again noted to be dominant. Mild branch vessel irregularity. No aneurysm identified. Posterior circulation: Intracranial vertebral arteries are patent to the basilar with the left being dominant. Patent PICA, AICA, and SCA origins. Basilar artery is widely patent. Posterior communicating arteries are not identified. PCAs are patent with mild asymmetric attenuation of distal right PCA branch vessels but no evidence of significant proximal stenosis. No aneurysm identified. Venous sinuses: Not well evaluated due to arterial phase contrast timing. Anatomic variants: None of significance. Review of the MIP images confirms the above findings IMPRESSION: Very mild intracranial and  extracranial atherosclerosis without evidence of major vessel occlusion or significant stenosis. These results were called by telephone at the time of interpretation on 06/02/2016 at 3:07 pm to Dr. Jaynee Eagles, who verbally acknowledged these results. Electronically Signed   By: Logan Bores M.D.   On: 06/02/2016 15:16   Dg Ribs Unilateral W/chest Right  Result Date: 06/01/2016 CLINICAL DATA:  Fall on Friday, right rib pain EXAM: RIGHT RIBS AND CHEST - 3+ VIEW COMPARISON:  None. FINDINGS: Five views right ribs submitted. There is nondisplaced fracture of the eighth rib. Minimal displaced fracture of the right fifth rib. No pneumothorax. IMPRESSION: There is nondisplaced fracture of the eighth rib. Minimal displaced fracture of the right fifth rib. No pneumothorax. Electronically Signed   By: Lahoma Crocker M.D.   On: 06/01/2016 12:24   Dg Sternum  Result Date: 06/01/2016 CLINICAL DATA:  Fall on Friday, right rib pain EXAM: STERNUM - 2+ VIEW COMPARISON:  None. FINDINGS: There is no evidence of fracture or other focal bone lesions. IMPRESSION: Negative. Electronically Signed   By: Lahoma Crocker M.D.   On: 06/01/2016 12:25   Ct Angio Neck W Or Wo Contrast  Result Date: 06/02/2016 CLINICAL DATA:  Right arm weakness and slurred speech. Prior meningioma resection. EXAM: CT ANGIOGRAPHY HEAD AND NECK TECHNIQUE: Multidetector CT imaging of the head and neck was performed using the standard protocol during bolus administration of intravenous contrast. Multiplanar CT image reconstructions and MIPs were obtained to evaluate the vascular anatomy. Carotid stenosis measurements (when applicable) are obtained utilizing NASCET criteria, using the distal internal carotid diameter as the denominator. CONTRAST:  50 mL Isovue 370 COMPARISON:  Head and neck CTA 08/26/2012 FINDINGS: CTA NECK FINDINGS Aortic arch: 3 vessel aortic arch with minimal atherosclerosis. Arch vessel origins are widely patent. Right carotid system: Patent with  focal calcified plaque in the proximal ICA. No stenosis. Left carotid system: Patent with minimal non stenotic plaque at the carotid bifurcation. Vertebral arteries: Patent without evidence of stenosis. The left vertebral artery is dominant. Both vertebral arteries are somewhat tortuous proximally. Skeleton: Moderately advanced cervical facet arthrosis, greater on the left. Cervical disc degeneration most advanced at C5-6. Other neck: No evidence of acute abnormality or mass. Upper chest: Unremarkable. Review of the MIP images confirms the above findings CTA HEAD FINDINGS Anterior circulation: Internal carotid arteries are widely patent from skullbase to carotid termini. ACAs and MCAs are patent without evidence of major branch occlusion or significant proximal stenosis. Right A2 segment again noted to be dominant. Mild branch vessel irregularity. No aneurysm identified. Posterior circulation: Intracranial vertebral arteries are patent to the basilar with the left being dominant. Patent PICA, AICA, and SCA origins. Basilar artery is widely patent. Posterior communicating arteries are not identified. PCAs are patent with mild asymmetric attenuation of distal right PCA branch vessels but no evidence of significant proximal stenosis. No aneurysm identified. Venous sinuses: Not well evaluated due to arterial phase contrast timing. Anatomic variants: None of significance. Review of the MIP images confirms the above findings IMPRESSION: Very mild intracranial and extracranial atherosclerosis without evidence of major vessel occlusion or significant stenosis. These results were called by telephone at the time of interpretation on 06/02/2016 at 3:07 pm to Dr. Jaynee Eagles, who verbally acknowledged these results. Electronically Signed   By: Logan Bores M.D.   On: 06/02/2016 15:16   Dg Chest Portable 1 View  Result Date: 06/02/2016 CLINICAL DATA:  Weakness. EXAM: PORTABLE CHEST 1 VIEW COMPARISON:  Radiographs of June 01, 2016. FINDINGS: The heart size and mediastinal contours are within normal limits. Both lungs are clear. No pneumothorax or pleural effusion is noted. Atherosclerosis of thoracic aorta is noted. The visualized skeletal structures are unremarkable. IMPRESSION: Aortic atherosclerosis.  No acute cardiopulmonary abnormality seen. Electronically Signed   By: Marijo Conception, M.D.   On: 06/02/2016 16:35   Ct Head Code Stroke Wo Contrast  Result Date: 06/02/2016 CLINICAL DATA:  Code stroke. Right-sided weakness, slurred speech, and confusion. EXAM: CT HEAD WITHOUT CONTRAST TECHNIQUE: Contiguous axial images were obtained from the base of the skull through the vertex without intravenous contrast. COMPARISON:  05/30/2013 FINDINGS: Brain: Chronic craniectomy changes involving the posterior skull and vertex with wires and clips which extend into the interhemispheric fissure and result in streak artifact, mildly limiting assessment of this region. Chronic encephalomalacia and gliosis in the superior aspects of both cerebral hemispheres are similar to the prior study. A small chronic infarct  involving the anterior right basal ganglia/ white matter adjacent to the frontal horn is unchanged. There is no definite evidence of acute cortical infarct, intracranial hemorrhage, mass, midline shift, or extra-axial fluid collection. Vascular: No hyperdense vessel or unexpected calcification. Skull: Craniectomy changes and unchanged diffuse skull heterogeneity predominantly in the frontal region. Sinuses/Orbits: Unremarkable orbits. Visualized paranasal sinuses and mastoid air cells are clear. Other: None. ASPECTS [facility] Stroke Program Early CT Score) - Ganglionic level infarction (caudate, lentiform nuclei, internal capsule, insula, M1-M3 cortex): 7 - Supraganglionic infarction (M4-M6 cortex): 3 Total score (0-10 with 10 being normal): 10 IMPRESSION: 1. No evidence of acute intracranial abnormality. 2. ASPECTS is 10. 3. Chronic  changes from prior meningioma resection. 4. Chronic right anterior basal ganglia infarct. These results were called by telephone at the time of interpretation on 06/02/2016 at 2:44 pm to Dr. Jaynee Eagles, who verbally acknowledged these results. Electronically Signed   By: Logan Bores M.D.   On: 06/02/2016 14:46   Echocardiogram: Left ventricle: The cavity size was normal. Wall thickness was increased in a pattern of mild LVH. Systolic function was normal. The estimated ejection fraction was in the range of 50% to 55%. Features are consistent with a pseudonormal left ventricular filling pattern, with concomitant abnormal relaxation and increased filling pressure (grade 2 diastolic dysfunction).  Subjective: Patient seen after breakfast.  Ate well this morning.  Voices that she feels stable to go home today.  No concerns otherwise.  Echocardiogram performed yesterday.  Discharge Exam: Vitals:   06/05/16 0451 06/05/16 1020  BP: (!) 149/72 (!) 124/54  Pulse: 64 61  Resp: 18 19  Temp: 97.6 F (36.4 C) 98.9 F (37.2 C)   Vitals:   06/04/16 1758 06/04/16 2238 06/05/16 0451 06/05/16 1020  BP: 121/61 105/64 (!) 149/72 (!) 124/54  Pulse: 62 68 64 61  Resp: 18 18 18 19   Temp: 97.7 F (36.5 C) 97.8 F (36.6 C) 97.6 F (36.4 C) 98.9 F (37.2 C)  TempSrc: Oral Oral Oral Oral  SpO2: 100% 97% 98% 100%  Weight:      Height:        General: Pt is alert, awake, not in acute distress Cardiovascular: RRR, S1/S2 +, no rubs, no gallops Respiratory: CTA bilaterally, no wheezing, no rhonchi Abdominal: Soft, NT, ND, bowel sounds + Extremities: no edema, no cyanosis, contracted lower extremities bilaterally (chronic)    The results of significant diagnostics from this hospitalization (including imaging, microbiology, ancillary and laboratory) are listed below for reference.     Microbiology: Recent Results (from the past 240 hour(s))  Urine culture     Status: Abnormal   Collection Time: 06/02/16  7:36  PM  Result Value Ref Range Status   Specimen Description URINE, CLEAN CATCH  Final   Special Requests NONE  Final   Culture 20,000 COLONIES/mL PSEUDOMONAS AERUGINOSA (A)  Final   Report Status 06/05/2016 FINAL  Final   Organism ID, Bacteria PSEUDOMONAS AERUGINOSA (A)  Final      Susceptibility   Pseudomonas aeruginosa - MIC*    CEFTAZIDIME <=1 SENSITIVE Sensitive     CIPROFLOXACIN <=0.25 SENSITIVE Sensitive     GENTAMICIN <=1 SENSITIVE Sensitive     IMIPENEM 1 SENSITIVE Sensitive     PIP/TAZO <=4 SENSITIVE Sensitive     CEFEPIME <=1 SENSITIVE Sensitive     * 20,000 COLONIES/mL PSEUDOMONAS AERUGINOSA     Labs: BNP (last 3 results) No results for input(s): BNP in the last 8760 hours. Basic Metabolic Panel:  Recent  Labs Lab 06/02/16 1430 06/02/16 1431 06/03/16 0408  NA 135 134* 134*  K 4.9 4.6 3.8  CL 102 99* 101  CO2 22  --  22  GLUCOSE 103* 109* 89  BUN 11 15 8   CREATININE 0.68 0.50 0.49  CALCIUM 9.5  --  9.4   Liver Function Tests:  Recent Labs Lab 06/02/16 1430  AST 32  ALT 22  ALKPHOS 86  BILITOT 1.4*  PROT 6.7  ALBUMIN 4.1   No results for input(s): LIPASE, AMYLASE in the last 168 hours.  Recent Labs Lab 06/02/16 1905  AMMONIA 25   CBC:  Recent Labs Lab 06/02/16 1431 06/02/16 1508 06/03/16 0408  WBC  --  7.9 6.7  NEUTROABS  --  5.0 3.4  HGB 16.0* 15.1* 14.2  HCT 47.0* 45.3 42.6  MCV  --  89.5 88.9  PLT  --  205 197   Cardiac Enzymes: No results for input(s): CKTOTAL, CKMB, CKMBINDEX, TROPONINI in the last 168 hours. BNP: Invalid input(s): POCBNP CBG: No results for input(s): GLUCAP in the last 168 hours. D-Dimer No results for input(s): DDIMER in the last 72 hours. Hgb A1c  Recent Labs  06/02/16 1508  HGBA1C 5.3   Lipid Profile  Recent Labs  06/03/16 0408  CHOL 154  HDL 55  LDLCALC 79  TRIG 99  CHOLHDL 2.8   Thyroid function studies  Recent Labs  06/02/16 1852  TSH 0.790   Anemia work up  Recent Labs   06/02/16 1905  VITAMINB12 435   Urinalysis    Component Value Date/Time   COLORURINE YELLOW 06/02/2016 1937   APPEARANCEUR CLEAR 06/02/2016 1937   LABSPEC 1.017 06/02/2016 1937   PHURINE 6.5 06/02/2016 1937   GLUCOSEU NEGATIVE 06/02/2016 1937   HGBUR TRACE (A) 06/02/2016 1937   HGBUR negative 03/24/2010 0924   BILIRUBINUR NEGATIVE 06/02/2016 1937   BILIRUBINUR n 04/30/2016 1544   KETONESUR NEGATIVE 06/02/2016 1937   PROTEINUR NEGATIVE 06/02/2016 1937   UROBILINOGEN 0.2 04/30/2016 1544   UROBILINOGEN 0.2 09/02/2012 2212   NITRITE NEGATIVE 06/02/2016 1937   LEUKOCYTESUR MODERATE (A) 06/02/2016 1937   Sepsis Labs Invalid input(s): PROCALCITONIN,  WBC,  LACTICIDVEN Microbiology Recent Results (from the past 240 hour(s))  Urine culture     Status: Abnormal   Collection Time: 06/02/16  7:36 PM  Result Value Ref Range Status   Specimen Description URINE, CLEAN CATCH  Final   Special Requests NONE  Final   Culture 20,000 COLONIES/mL PSEUDOMONAS AERUGINOSA (A)  Final   Report Status 06/05/2016 FINAL  Final   Organism ID, Bacteria PSEUDOMONAS AERUGINOSA (A)  Final      Susceptibility   Pseudomonas aeruginosa - MIC*    CEFTAZIDIME <=1 SENSITIVE Sensitive     CIPROFLOXACIN <=0.25 SENSITIVE Sensitive     GENTAMICIN <=1 SENSITIVE Sensitive     IMIPENEM 1 SENSITIVE Sensitive     PIP/TAZO <=4 SENSITIVE Sensitive     CEFEPIME <=1 SENSITIVE Sensitive     * 20,000 COLONIES/mL PSEUDOMONAS AERUGINOSA     Time coordinating discharge: Less than 30 minutes  SIGNED:   Newman Pies, MD  [facility] 06/05/2016, 10:24 AM Pager [phone] If 7PM-7AM, please contact night-coverage [email or web address] Password [email or web address]

## 2016-06-05 DIAGNOSIS — M6289 Other specified disorders of muscle: Secondary | ICD-10-CM | POA: Diagnosis not present

## 2016-06-05 DIAGNOSIS — Z9889 Other specified postprocedural states: Secondary | ICD-10-CM | POA: Diagnosis not present

## 2016-06-05 DIAGNOSIS — Z8673 Personal history of transient ischemic attack (TIA), and cerebral infarction without residual deficits: Secondary | ICD-10-CM | POA: Diagnosis not present

## 2016-06-05 DIAGNOSIS — I10 Essential (primary) hypertension: Secondary | ICD-10-CM | POA: Diagnosis not present

## 2016-06-05 DIAGNOSIS — R531 Weakness: Secondary | ICD-10-CM | POA: Diagnosis not present

## 2016-06-05 DIAGNOSIS — G40209 Localization-related (focal) (partial) symptomatic epilepsy and epileptic syndromes with complex partial seizures, not intractable, without status epilepticus: Secondary | ICD-10-CM | POA: Diagnosis not present

## 2016-06-05 LAB — URINE CULTURE: Culture: 20000 — AB

## 2016-06-05 NOTE — Progress Notes (Signed)
STROKE TEAM PROGRESS NOTE   HISTORY OF PRESENT ILLNESS (per record) Stacy Diaz is an 65 y.o. female with history of previous meningioma resection with subsequent spastic paraparesis, seizures.  Today at 1445 noted confusion, left facial decreased sensation, right arm weakness. On arrival she had improved symptoms on the right and was not confused. CT of head was obtained and CTA was also obtained both negative for acute event. At her baseline she has trouble angulating for the past 4 years but could bear weight on her legs but needs help with transfers. Patient has a history of seizures and is on Tegretol. She states she fell in the bathroom yesterday but denies decreased LOC or hitting her head. She fractured her ribs and says she is in pain and has been increasingly taken percocet for the rib pain.   Patient has had multiple left-sided episodes of weakness but none on the right. Husband at bedside. She developed left arm weakness and facial droop on the morning of 08/25/2012. She had multiple episodes in the past similar to the episode above but this one had been persistent. MRI was not an option since she had history of no clips to 2 meningioma resection. Since her surgery she has had trouble with weakness from waist down. She was last known well on 06/02/2016 at 14:45. Patient was not administered IV t-PA secondary to  resolving symptoms. She was admitted for further evaluation and treatment.   SUBJECTIVE (INTERVAL HISTORY) Her husband was at the bedside.  Overall she feels her condition is stable.   OBJECTIVE Temp:  [97.6 F (36.4 C)-98.9 F (37.2 C)] 98.9 F (37.2 C) (10/20 1020) Pulse Rate:  [61-68] 61 (10/20 1020) Cardiac Rhythm: Normal sinus rhythm (10/20 0700) Resp:  [18-19] 19 (10/20 1020) BP: (105-149)/(53-72) 124/54 (10/20 1020) SpO2:  [97 %-100 %] 100 % (10/20 1020)  CBC:   Recent Labs Lab 06/02/16 1508 06/03/16 0408  WBC 7.9 6.7  NEUTROABS 5.0 3.4  HGB 15.1* 14.2   HCT 45.3 42.6  MCV 89.5 88.9  PLT 205 [id number]    Basic Metabolic Panel:   Recent Labs Lab 06/02/16 1430 06/02/16 1431 06/03/16 0408  NA 135 134* 134*  K 4.9 4.6 3.8  CL 102 99* 101  CO2 22  --  22  GLUCOSE 103* 109* 89  BUN 11 15 8   CREATININE 0.68 0.50 0.49  CALCIUM 9.5  --  9.4    Lipid Panel:     Component Value Date/Time   CHOL 154 06/03/2016 0408   TRIG 99 06/03/2016 0408   HDL 55 06/03/2016 0408   CHOLHDL 2.8 06/03/2016 0408   VLDL 20 06/03/2016 0408   LDLCALC 79 06/03/2016 0408   HgbA1c:  Lab Results  Component Value Date   HGBA1C 5.3 06/02/2016   Urine Drug Screen:     Component Value Date/Time   LABOPIA NONE DETECTED 06/02/2016 1936   COCAINSCRNUR NONE DETECTED 06/02/2016 1936   LABBENZ POSITIVE (A) 06/02/2016 1936   AMPHETMU NONE DETECTED 06/02/2016 1936   THCU NONE DETECTED 06/02/2016 1936   LABBARB NONE DETECTED 06/02/2016 1936      IMAGING  No results found. Carotid Doppler   There is 1-39% bilateral ICA stenosis. Vertebral artery flow is antegrade.    EEG :  awake and asleep EEG is abnormal due to mild diffuse slowing of the waking background  PHYSICAL EXAM Pleasant middle aged Caucasian lady not in distress. . Afebrile. Head is nontraumatic. Neck is supple without bruit.  Cardiac exam no murmur or gallop. Lungs are clear to auscultation. Distal pulses are well felt. NEUROLOGIC EXAM:  MENTAL STATUS: awake, alert and oriented to person, place and time, language fluent, comprehension intact, naming intact  CRANIAL NERVE: pupils equal and reactive to light, visual fields full to confrontation, mild facial asymmetry on the left, uvula midline, shoulder shrug symmetric, tongue midline.  MOTOR: Significant weakness in right and left lower extremities with only 1/5 strength hip extension and 0/5 at the hips ankles and toes. Tone is increase in the both lower extremities, with significant spasticity. 4/5 strength left upper extremity, fine finger  movements decreased on left  SENSORY: normal and symmetric to light touch . No sensory level on the trunk COORDINATION: finger-nose-finger with mild ataxia on left, normal on right  REFLEXES: deep tendon reflexes BUE present and symmetric 2+, BLE brisk 2= but difficult to elicit  GAIT/STATION: Wheelchair-bound.  ASSESSMENT/PLAN Stacy Diaz is a 65 y.o. female with history of  previous meningioma resection with subsequent spastic paraparesis, seizures presenting with decreased left facial  sensation, right arm weakness. She did not receive IV t-PA due to resolving symptoms.   Possible TIA  MRI  Unable to perform d/t surgical slips from prior meningioma resection  CTA head and neck mild intra and extra cranial atherosclerosis  Carotid Doppler no significant stenosis 2D Echo  Left ventricle: The cavity size was normal. Wall thickness was   increased in a pattern of mild LVH. Systolic function was normal.    The estimated ejection fraction was in the range of 50% to 55%LDL 79  HgbA1c 5.4  Lovenox 40 mg sq daily for VTE prophylaxis Diet Heart Room service appropriate? Yes; Fluid consistency: Thin Diet - low sodium heart healthy clopidogrel 75 mg daily prior to admission, now on clopidogrel 75 mg daily. Continue plavix at discharge.  Patient counseled to be compliant with her antithrombotic medications  Ongoing aggressive stroke risk factor management  Therapy recommendations:  HH OT  Disposition:  Return home with [place] therapies (lives w/ husband who provides care, w/c bound)  Seizure disorder   On percocet, increased dosing following fall with rib fx. Advised to decrease dosing back to baseline  On trileptal. Check level  Difficult to control with recurrent episodes. 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 Wheelchair bound at [facility] day # 0   I have personally examined this patient, reviewed notes, independently viewed imaging studies, participated in medical decision making and plan of care.ROS completed by me personally and pertinent positives fully documented  I have made any additions or clarifications directly to the above note.    Patient presented with transient episode of right-sided weakness, numbness, confusion and speech difficulties of unclear etiology. Possibilities include left hemispheric  TIA  Versus  atypical complex partial seizure Trileptal level is  EEG. Continue Plavix for stroke prevention. Long discussion with patient   at the bedside and answered questions. Greater than 50% time during this 15 minute visit was spent on counseling and cognition of care about seizures and TIA. oxcarbamezapine level yet pending. DC pt home and f/u as outpatient in my clinic as scheduled. Antony Contras, MD Medical Director [facility] Pager: [phone] 06/05/2016 12:38 PM  To contact Stroke Continuity provider, please refer to [email or web address] After hours, contact General Neurology

## 2016-06-05 NOTE — Progress Notes (Signed)
Stacy Diaz with [facility] accepted the referral for [facility] services. Per bedside RN wheelchair is in the patients room. No further needs per CM.

## 2016-06-05 NOTE — Progress Notes (Signed)
Pt discharged home with spouse. IV and telemetry discontinued. Discharge information given. Patient questions asked and answered. Pt to be transported home by spouse. Wendee Copp

## 2016-06-06 DIAGNOSIS — Z7902 Long term (current) use of antithrombotics/antiplatelets: Secondary | ICD-10-CM | POA: Diagnosis not present

## 2016-06-06 DIAGNOSIS — Z853 Personal history of malignant neoplasm of breast: Secondary | ICD-10-CM | POA: Diagnosis not present

## 2016-06-06 DIAGNOSIS — Z993 Dependence on wheelchair: Secondary | ICD-10-CM | POA: Diagnosis not present

## 2016-06-06 DIAGNOSIS — M6281 Muscle weakness (generalized): Secondary | ICD-10-CM | POA: Diagnosis not present

## 2016-06-06 DIAGNOSIS — S2241XD Multiple fractures of ribs, right side, subsequent encounter for fracture with routine healing: Secondary | ICD-10-CM | POA: Diagnosis not present

## 2016-06-06 DIAGNOSIS — Z86011 Personal history of benign neoplasm of the brain: Secondary | ICD-10-CM | POA: Diagnosis not present

## 2016-06-06 DIAGNOSIS — G40209 Localization-related (focal) (partial) symptomatic epilepsy and epileptic syndromes with complex partial seizures, not intractable, without status epilepticus: Secondary | ICD-10-CM | POA: Diagnosis not present

## 2016-06-06 DIAGNOSIS — Z9012 Acquired absence of left breast and nipple: Secondary | ICD-10-CM | POA: Diagnosis not present

## 2016-06-06 DIAGNOSIS — I10 Essential (primary) hypertension: Secondary | ICD-10-CM | POA: Diagnosis not present

## 2016-06-06 DIAGNOSIS — N390 Urinary tract infection, site not specified: Secondary | ICD-10-CM | POA: Diagnosis not present

## 2016-06-06 LAB — 10-HYDROXYCARBAZEPINE: Triliptal/MTB(Oxcarbazepin): 11 ug/mL (ref 10–35)

## 2016-06-07 ENCOUNTER — Encounter: Payer: Self-pay | Admitting: Family Medicine

## 2016-06-08 ENCOUNTER — Encounter: Payer: Self-pay | Admitting: Adult Health

## 2016-06-08 ENCOUNTER — Encounter: Payer: Self-pay | Admitting: Family Medicine

## 2016-06-09 ENCOUNTER — Telehealth: Payer: Self-pay | Admitting: Family Medicine

## 2016-06-09 ENCOUNTER — Other Ambulatory Visit: Payer: Self-pay | Admitting: Family Medicine

## 2016-06-09 DIAGNOSIS — S2241XD Multiple fractures of ribs, right side, subsequent encounter for fracture with routine healing: Secondary | ICD-10-CM | POA: Diagnosis not present

## 2016-06-09 DIAGNOSIS — I10 Essential (primary) hypertension: Secondary | ICD-10-CM | POA: Diagnosis not present

## 2016-06-09 DIAGNOSIS — Z86011 Personal history of benign neoplasm of the brain: Secondary | ICD-10-CM | POA: Diagnosis not present

## 2016-06-09 DIAGNOSIS — Z993 Dependence on wheelchair: Secondary | ICD-10-CM | POA: Diagnosis not present

## 2016-06-09 DIAGNOSIS — M6281 Muscle weakness (generalized): Secondary | ICD-10-CM | POA: Diagnosis not present

## 2016-06-09 DIAGNOSIS — Z853 Personal history of malignant neoplasm of breast: Secondary | ICD-10-CM | POA: Diagnosis not present

## 2016-06-09 DIAGNOSIS — G40209 Localization-related (focal) (partial) symptomatic epilepsy and epileptic syndromes with complex partial seizures, not intractable, without status epilepticus: Secondary | ICD-10-CM | POA: Diagnosis not present

## 2016-06-09 DIAGNOSIS — Z9012 Acquired absence of left breast and nipple: Secondary | ICD-10-CM | POA: Diagnosis not present

## 2016-06-09 DIAGNOSIS — Z7902 Long term (current) use of antithrombotics/antiplatelets: Secondary | ICD-10-CM | POA: Diagnosis not present

## 2016-06-09 DIAGNOSIS — N390 Urinary tract infection, site not specified: Secondary | ICD-10-CM | POA: Diagnosis not present

## 2016-06-09 NOTE — Telephone Encounter (Signed)
Pt need to have a urinalysis done can we have a order for it?

## 2016-06-10 ENCOUNTER — Telehealth: Payer: Self-pay

## 2016-06-10 NOTE — Telephone Encounter (Signed)
Called and spoke with Pt's husbands. He states that his wife was hospitalized and they informed them that the urine specimen that was provided was insufficient and that pt should follow up with PCP for further evaluation. Pt has a follow up appointment scheduled 06/29/16. Nothing further needed at this time.

## 2016-06-10 NOTE — Telephone Encounter (Signed)
Can you please call patient and schedule hospital follow up with either [facility] or Dr. Sherren Mocha?  Thank you!

## 2016-06-16 ENCOUNTER — Telehealth: Payer: Self-pay | Admitting: Family Medicine

## 2016-06-16 ENCOUNTER — Other Ambulatory Visit: Payer: Self-pay | Admitting: Emergency Medicine

## 2016-06-16 MED ORDER — LORAZEPAM 1 MG PO TABS: 1.0000 mg | ORAL_TABLET | Freq: Two times a day (BID) | ORAL | 0 refills | Status: DC | PRN

## 2016-06-16 NOTE — Telephone Encounter (Signed)
Called and spoke with pt's husband informing him that pt;s prescription had been called into pharmacy. Understanding verbalized nothing more needed at this time.

## 2016-06-16 NOTE — Telephone Encounter (Signed)
Pt needs a refill on lorazepam send to [facility] [address]. Pt has an appt with dr todd on 06-29-16

## 2016-06-23 ENCOUNTER — Telehealth: Payer: Self-pay | Admitting: Family Medicine

## 2016-06-23 DIAGNOSIS — Z993 Dependence on wheelchair: Secondary | ICD-10-CM | POA: Diagnosis not present

## 2016-06-23 DIAGNOSIS — R41841 Cognitive communication deficit: Secondary | ICD-10-CM | POA: Diagnosis not present

## 2016-06-23 DIAGNOSIS — I10 Essential (primary) hypertension: Secondary | ICD-10-CM | POA: Diagnosis not present

## 2016-06-23 DIAGNOSIS — Z86011 Personal history of benign neoplasm of the brain: Secondary | ICD-10-CM | POA: Diagnosis not present

## 2016-06-23 DIAGNOSIS — M6281 Muscle weakness (generalized): Secondary | ICD-10-CM | POA: Diagnosis not present

## 2016-06-23 DIAGNOSIS — G40209 Localization-related (focal) (partial) symptomatic epilepsy and epileptic syndromes with complex partial seizures, not intractable, without status epilepticus: Secondary | ICD-10-CM | POA: Diagnosis not present

## 2016-06-23 NOTE — Telephone Encounter (Signed)
° ° °  Betsy from [facility] call to ask for verbal orders to continue PT,and add speech therapy eval    [phone] [id number]

## 2016-06-25 DIAGNOSIS — Z86011 Personal history of benign neoplasm of the brain: Secondary | ICD-10-CM | POA: Diagnosis not present

## 2016-06-25 DIAGNOSIS — Z993 Dependence on wheelchair: Secondary | ICD-10-CM | POA: Diagnosis not present

## 2016-06-25 DIAGNOSIS — G40209 Localization-related (focal) (partial) symptomatic epilepsy and epileptic syndromes with complex partial seizures, not intractable, without status epilepticus: Secondary | ICD-10-CM | POA: Diagnosis not present

## 2016-06-25 DIAGNOSIS — I10 Essential (primary) hypertension: Secondary | ICD-10-CM | POA: Diagnosis not present

## 2016-06-25 DIAGNOSIS — M6281 Muscle weakness (generalized): Secondary | ICD-10-CM | POA: Diagnosis not present

## 2016-06-25 DIAGNOSIS — R41841 Cognitive communication deficit: Secondary | ICD-10-CM | POA: Diagnosis not present

## 2016-06-25 NOTE — Telephone Encounter (Signed)
Called and left voicemail on [employer]. Per Dr. Sherren Mocha verbal ordered for PT and speech evaluation are okay. If anything further is needed I left my contact number on voicemail.

## 2016-06-29 ENCOUNTER — Encounter: Payer: Self-pay | Admitting: Family Medicine

## 2016-06-29 ENCOUNTER — Ambulatory Visit (INDEPENDENT_AMBULATORY_CARE_PROVIDER_SITE_OTHER): Payer: Medicare Other | Admitting: Family Medicine

## 2016-06-29 VITALS — BP 186/98 | HR 65 | Temp 98.4°F

## 2016-06-29 DIAGNOSIS — Z7689 Persons encountering health services in other specified circumstances: Secondary | ICD-10-CM

## 2016-06-29 DIAGNOSIS — G459 Transient cerebral ischemic attack, unspecified: Secondary | ICD-10-CM

## 2016-06-29 NOTE — Patient Instructions (Signed)
Continue current medications  Follow-up when necessary

## 2016-06-29 NOTE — Progress Notes (Signed)
Pre visit review using our clinic review tool, if applicable. No additional management support is needed unless otherwise documented below in the visit note. 

## 2016-06-29 NOTE — Progress Notes (Signed)
Stacy Diaz is a delightful 65 year old married female nonsmoker who comes in today following a TIA  She states on October 13 she fell in the shower and hurt her chest wall. On October 16 she went the emergency room because severe pain. X-ray showed 2 fractures. She was given pain medicine sent home. The next day she noticed some right-sided weakness EMS was called she was taken the hospital. The symptoms lasted about 2 hours and spontaneously resolved. She had a complete neurologic evaluation and nothing will be found was fixable. She's done well at home for the past 3 weeks except she needs a new transfer disc.  As noted she had the previous history of the brain tumor years ago subsequent surgery and developed the lower extremity paralysis.  Blood pressure at home 125/68 checked by multiple biters. Blood pressure here today 186/98.  Physical evaluation vital signs stable she's afebrile except for blood pressure elevation. Repeat by me 170/90. She states her blood pressure always comes up when she goes to the doctor's office. Again it's been checked by multiple providers at home it's all been normal.  Weakness right arm has resolved strength is normal  Impression TIA......... continue current medication.......Marland Kitchen monitor blood pressure meticulously  History of lower extremity paralysis...Marland KitchenMarland KitchenMarland Kitchen reorder transfer disc

## 2016-06-30 ENCOUNTER — Telehealth: Payer: Self-pay | Admitting: Family Medicine

## 2016-06-30 DIAGNOSIS — I10 Essential (primary) hypertension: Secondary | ICD-10-CM | POA: Diagnosis not present

## 2016-06-30 DIAGNOSIS — R41841 Cognitive communication deficit: Secondary | ICD-10-CM | POA: Diagnosis not present

## 2016-06-30 DIAGNOSIS — Z86011 Personal history of benign neoplasm of the brain: Secondary | ICD-10-CM | POA: Diagnosis not present

## 2016-06-30 DIAGNOSIS — Z993 Dependence on wheelchair: Secondary | ICD-10-CM | POA: Diagnosis not present

## 2016-06-30 DIAGNOSIS — G40209 Localization-related (focal) (partial) symptomatic epilepsy and epileptic syndromes with complex partial seizures, not intractable, without status epilepticus: Secondary | ICD-10-CM | POA: Diagnosis not present

## 2016-06-30 DIAGNOSIS — M6281 Muscle weakness (generalized): Secondary | ICD-10-CM | POA: Diagnosis not present

## 2016-06-30 NOTE — Telephone Encounter (Signed)
Sandy with [facility] would like verbal orders for Speech therapy  1 X /wk for 6 weeks

## 2016-06-30 NOTE — Telephone Encounter (Signed)
Called and spoke with sandy. Per Dr. Sherren Mocha verbal order's are okay. Understanding verbalized nothing further needed at this time.

## 2016-07-02 DIAGNOSIS — R41841 Cognitive communication deficit: Secondary | ICD-10-CM | POA: Diagnosis not present

## 2016-07-02 DIAGNOSIS — I10 Essential (primary) hypertension: Secondary | ICD-10-CM | POA: Diagnosis not present

## 2016-07-02 DIAGNOSIS — Z993 Dependence on wheelchair: Secondary | ICD-10-CM | POA: Diagnosis not present

## 2016-07-02 DIAGNOSIS — Z86011 Personal history of benign neoplasm of the brain: Secondary | ICD-10-CM | POA: Diagnosis not present

## 2016-07-02 DIAGNOSIS — M6281 Muscle weakness (generalized): Secondary | ICD-10-CM | POA: Diagnosis not present

## 2016-07-02 DIAGNOSIS — G40209 Localization-related (focal) (partial) symptomatic epilepsy and epileptic syndromes with complex partial seizures, not intractable, without status epilepticus: Secondary | ICD-10-CM | POA: Diagnosis not present

## 2016-07-03 ENCOUNTER — Other Ambulatory Visit: Payer: Self-pay | Admitting: Hematology and Oncology

## 2016-07-03 ENCOUNTER — Other Ambulatory Visit: Payer: Self-pay

## 2016-07-03 DIAGNOSIS — G459 Transient cerebral ischemic attack, unspecified: Secondary | ICD-10-CM

## 2016-07-03 DIAGNOSIS — C50512 Malignant neoplasm of lower-outer quadrant of left female breast: Secondary | ICD-10-CM

## 2016-07-04 DIAGNOSIS — G40209 Localization-related (focal) (partial) symptomatic epilepsy and epileptic syndromes with complex partial seizures, not intractable, without status epilepticus: Secondary | ICD-10-CM | POA: Diagnosis not present

## 2016-07-04 DIAGNOSIS — R41841 Cognitive communication deficit: Secondary | ICD-10-CM | POA: Diagnosis not present

## 2016-07-04 DIAGNOSIS — I10 Essential (primary) hypertension: Secondary | ICD-10-CM | POA: Diagnosis not present

## 2016-07-04 DIAGNOSIS — Z993 Dependence on wheelchair: Secondary | ICD-10-CM | POA: Diagnosis not present

## 2016-07-04 DIAGNOSIS — M6281 Muscle weakness (generalized): Secondary | ICD-10-CM | POA: Diagnosis not present

## 2016-07-04 DIAGNOSIS — Z86011 Personal history of benign neoplasm of the brain: Secondary | ICD-10-CM | POA: Diagnosis not present

## 2016-07-07 DIAGNOSIS — G40209 Localization-related (focal) (partial) symptomatic epilepsy and epileptic syndromes with complex partial seizures, not intractable, without status epilepticus: Secondary | ICD-10-CM | POA: Diagnosis not present

## 2016-07-07 DIAGNOSIS — I10 Essential (primary) hypertension: Secondary | ICD-10-CM | POA: Diagnosis not present

## 2016-07-07 DIAGNOSIS — R41841 Cognitive communication deficit: Secondary | ICD-10-CM | POA: Diagnosis not present

## 2016-07-07 DIAGNOSIS — Z993 Dependence on wheelchair: Secondary | ICD-10-CM | POA: Diagnosis not present

## 2016-07-07 DIAGNOSIS — M6281 Muscle weakness (generalized): Secondary | ICD-10-CM | POA: Diagnosis not present

## 2016-07-07 DIAGNOSIS — Z86011 Personal history of benign neoplasm of the brain: Secondary | ICD-10-CM | POA: Diagnosis not present

## 2016-07-10 DIAGNOSIS — M6281 Muscle weakness (generalized): Secondary | ICD-10-CM | POA: Diagnosis not present

## 2016-07-10 DIAGNOSIS — Z993 Dependence on wheelchair: Secondary | ICD-10-CM | POA: Diagnosis not present

## 2016-07-10 DIAGNOSIS — I10 Essential (primary) hypertension: Secondary | ICD-10-CM | POA: Diagnosis not present

## 2016-07-10 DIAGNOSIS — Z86011 Personal history of benign neoplasm of the brain: Secondary | ICD-10-CM | POA: Diagnosis not present

## 2016-07-10 DIAGNOSIS — G40209 Localization-related (focal) (partial) symptomatic epilepsy and epileptic syndromes with complex partial seizures, not intractable, without status epilepticus: Secondary | ICD-10-CM | POA: Diagnosis not present

## 2016-07-10 DIAGNOSIS — R41841 Cognitive communication deficit: Secondary | ICD-10-CM | POA: Diagnosis not present

## 2016-07-13 ENCOUNTER — Other Ambulatory Visit: Payer: Self-pay | Admitting: Family Medicine

## 2016-07-13 DIAGNOSIS — R41841 Cognitive communication deficit: Secondary | ICD-10-CM | POA: Diagnosis not present

## 2016-07-13 DIAGNOSIS — Z86011 Personal history of benign neoplasm of the brain: Secondary | ICD-10-CM | POA: Diagnosis not present

## 2016-07-13 DIAGNOSIS — G40209 Localization-related (focal) (partial) symptomatic epilepsy and epileptic syndromes with complex partial seizures, not intractable, without status epilepticus: Secondary | ICD-10-CM | POA: Diagnosis not present

## 2016-07-13 DIAGNOSIS — Z993 Dependence on wheelchair: Secondary | ICD-10-CM | POA: Diagnosis not present

## 2016-07-13 DIAGNOSIS — Z8673 Personal history of transient ischemic attack (TIA), and cerebral infarction without residual deficits: Secondary | ICD-10-CM

## 2016-07-13 DIAGNOSIS — I10 Essential (primary) hypertension: Secondary | ICD-10-CM | POA: Diagnosis not present

## 2016-07-13 DIAGNOSIS — M6281 Muscle weakness (generalized): Secondary | ICD-10-CM | POA: Diagnosis not present

## 2016-07-14 ENCOUNTER — Telehealth: Payer: Self-pay | Admitting: Family Medicine

## 2016-07-14 NOTE — Telephone Encounter (Signed)
Stacy Diaz needs extended PT order for this twice a wk for 4 wks and once a wk for 2 wks. Stacy Diaz also would like OT verbal order for evaluation

## 2016-07-16 DIAGNOSIS — Z993 Dependence on wheelchair: Secondary | ICD-10-CM | POA: Diagnosis not present

## 2016-07-16 DIAGNOSIS — R41841 Cognitive communication deficit: Secondary | ICD-10-CM | POA: Diagnosis not present

## 2016-07-16 DIAGNOSIS — I10 Essential (primary) hypertension: Secondary | ICD-10-CM | POA: Diagnosis not present

## 2016-07-16 DIAGNOSIS — Z86011 Personal history of benign neoplasm of the brain: Secondary | ICD-10-CM | POA: Diagnosis not present

## 2016-07-16 DIAGNOSIS — M6281 Muscle weakness (generalized): Secondary | ICD-10-CM | POA: Diagnosis not present

## 2016-07-16 DIAGNOSIS — G40209 Localization-related (focal) (partial) symptomatic epilepsy and epileptic syndromes with complex partial seizures, not intractable, without status epilepticus: Secondary | ICD-10-CM | POA: Diagnosis not present

## 2016-07-16 NOTE — Telephone Encounter (Signed)
Per Dr. Sherren Mocha verbal orders are okay. Called return number and number has been disconnected. Will try again at a later date.

## 2016-07-17 DIAGNOSIS — I10 Essential (primary) hypertension: Secondary | ICD-10-CM | POA: Diagnosis not present

## 2016-07-17 DIAGNOSIS — Z993 Dependence on wheelchair: Secondary | ICD-10-CM | POA: Diagnosis not present

## 2016-07-17 DIAGNOSIS — M6281 Muscle weakness (generalized): Secondary | ICD-10-CM | POA: Diagnosis not present

## 2016-07-17 DIAGNOSIS — G40209 Localization-related (focal) (partial) symptomatic epilepsy and epileptic syndromes with complex partial seizures, not intractable, without status epilepticus: Secondary | ICD-10-CM | POA: Diagnosis not present

## 2016-07-17 DIAGNOSIS — Z86011 Personal history of benign neoplasm of the brain: Secondary | ICD-10-CM | POA: Diagnosis not present

## 2016-07-17 DIAGNOSIS — R41841 Cognitive communication deficit: Secondary | ICD-10-CM | POA: Diagnosis not present

## 2016-07-20 ENCOUNTER — Telehealth: Payer: Self-pay | Admitting: Family Medicine

## 2016-07-20 DIAGNOSIS — R41841 Cognitive communication deficit: Secondary | ICD-10-CM | POA: Diagnosis not present

## 2016-07-20 DIAGNOSIS — M6281 Muscle weakness (generalized): Secondary | ICD-10-CM | POA: Diagnosis not present

## 2016-07-20 DIAGNOSIS — G40209 Localization-related (focal) (partial) symptomatic epilepsy and epileptic syndromes with complex partial seizures, not intractable, without status epilepticus: Secondary | ICD-10-CM | POA: Diagnosis not present

## 2016-07-20 DIAGNOSIS — Z993 Dependence on wheelchair: Secondary | ICD-10-CM | POA: Diagnosis not present

## 2016-07-20 DIAGNOSIS — I10 Essential (primary) hypertension: Secondary | ICD-10-CM | POA: Diagnosis not present

## 2016-07-20 DIAGNOSIS — Z86011 Personal history of benign neoplasm of the brain: Secondary | ICD-10-CM | POA: Diagnosis not present

## 2016-07-20 NOTE — Telephone Encounter (Signed)
OT is requesting verbal order for 1 x a week for 1 week and 2 x's a week for 3 weeks.

## 2016-07-21 DIAGNOSIS — G40209 Localization-related (focal) (partial) symptomatic epilepsy and epileptic syndromes with complex partial seizures, not intractable, without status epilepticus: Secondary | ICD-10-CM | POA: Diagnosis not present

## 2016-07-21 DIAGNOSIS — Z86011 Personal history of benign neoplasm of the brain: Secondary | ICD-10-CM | POA: Diagnosis not present

## 2016-07-21 DIAGNOSIS — R41841 Cognitive communication deficit: Secondary | ICD-10-CM | POA: Diagnosis not present

## 2016-07-21 DIAGNOSIS — I10 Essential (primary) hypertension: Secondary | ICD-10-CM | POA: Diagnosis not present

## 2016-07-21 DIAGNOSIS — Z993 Dependence on wheelchair: Secondary | ICD-10-CM | POA: Diagnosis not present

## 2016-07-21 DIAGNOSIS — M6281 Muscle weakness (generalized): Secondary | ICD-10-CM | POA: Diagnosis not present

## 2016-07-21 NOTE — Telephone Encounter (Signed)
Spoke with will and per Dr. Sherren Mocha verbal orders okay. Understanding verbalized nothing further needed at this time.

## 2016-07-22 ENCOUNTER — Telehealth: Payer: Self-pay | Admitting: Emergency Medicine

## 2016-07-22 NOTE — Telephone Encounter (Signed)
Pt's husband [name] called requesting refill on Oxycodone 5mg  last filled 04/21/16 #540 refills: 0   Husband states that pt would like her script changed back to 10/325 for there is no longer a price difference with insurance. I informed husband  that Dr. Sherren Mocha is out of the office until Monday and I would address his concern upon his return Monday morning. [name] verbalized understanding, nothing further needed at this time.

## 2016-07-23 DIAGNOSIS — I10 Essential (primary) hypertension: Secondary | ICD-10-CM | POA: Diagnosis not present

## 2016-07-23 DIAGNOSIS — M6281 Muscle weakness (generalized): Secondary | ICD-10-CM | POA: Diagnosis not present

## 2016-07-23 DIAGNOSIS — G40209 Localization-related (focal) (partial) symptomatic epilepsy and epileptic syndromes with complex partial seizures, not intractable, without status epilepticus: Secondary | ICD-10-CM | POA: Diagnosis not present

## 2016-07-23 DIAGNOSIS — Z86011 Personal history of benign neoplasm of the brain: Secondary | ICD-10-CM | POA: Diagnosis not present

## 2016-07-23 DIAGNOSIS — Z993 Dependence on wheelchair: Secondary | ICD-10-CM | POA: Diagnosis not present

## 2016-07-23 DIAGNOSIS — R41841 Cognitive communication deficit: Secondary | ICD-10-CM | POA: Diagnosis not present

## 2016-07-24 DIAGNOSIS — G40209 Localization-related (focal) (partial) symptomatic epilepsy and epileptic syndromes with complex partial seizures, not intractable, without status epilepticus: Secondary | ICD-10-CM | POA: Diagnosis not present

## 2016-07-24 DIAGNOSIS — Z86011 Personal history of benign neoplasm of the brain: Secondary | ICD-10-CM | POA: Diagnosis not present

## 2016-07-24 DIAGNOSIS — R41841 Cognitive communication deficit: Secondary | ICD-10-CM | POA: Diagnosis not present

## 2016-07-24 DIAGNOSIS — M6281 Muscle weakness (generalized): Secondary | ICD-10-CM | POA: Diagnosis not present

## 2016-07-24 DIAGNOSIS — I10 Essential (primary) hypertension: Secondary | ICD-10-CM | POA: Diagnosis not present

## 2016-07-24 DIAGNOSIS — Z993 Dependence on wheelchair: Secondary | ICD-10-CM | POA: Diagnosis not present

## 2016-07-27 ENCOUNTER — Other Ambulatory Visit: Payer: Self-pay | Admitting: Emergency Medicine

## 2016-07-27 MED ORDER — OXYCODONE-ACETAMINOPHEN 10-325 MG PO TABS: 1.0000 | ORAL_TABLET | Freq: Three times a day (TID) | ORAL | 0 refills | Status: DC | PRN

## 2016-07-27 NOTE — Telephone Encounter (Signed)
Prescription printed and placed upfront for pick up. Pt is aware nothing further needed at this time.

## 2016-07-28 DIAGNOSIS — I10 Essential (primary) hypertension: Secondary | ICD-10-CM | POA: Diagnosis not present

## 2016-07-28 DIAGNOSIS — R41841 Cognitive communication deficit: Secondary | ICD-10-CM | POA: Diagnosis not present

## 2016-07-28 DIAGNOSIS — G40209 Localization-related (focal) (partial) symptomatic epilepsy and epileptic syndromes with complex partial seizures, not intractable, without status epilepticus: Secondary | ICD-10-CM | POA: Diagnosis not present

## 2016-07-28 DIAGNOSIS — Z993 Dependence on wheelchair: Secondary | ICD-10-CM | POA: Diagnosis not present

## 2016-07-28 DIAGNOSIS — Z86011 Personal history of benign neoplasm of the brain: Secondary | ICD-10-CM | POA: Diagnosis not present

## 2016-07-28 DIAGNOSIS — M6281 Muscle weakness (generalized): Secondary | ICD-10-CM | POA: Diagnosis not present

## 2016-07-29 DIAGNOSIS — Z993 Dependence on wheelchair: Secondary | ICD-10-CM | POA: Diagnosis not present

## 2016-07-29 DIAGNOSIS — R41841 Cognitive communication deficit: Secondary | ICD-10-CM | POA: Diagnosis not present

## 2016-07-29 DIAGNOSIS — M6281 Muscle weakness (generalized): Secondary | ICD-10-CM | POA: Diagnosis not present

## 2016-07-29 DIAGNOSIS — G40209 Localization-related (focal) (partial) symptomatic epilepsy and epileptic syndromes with complex partial seizures, not intractable, without status epilepticus: Secondary | ICD-10-CM | POA: Diagnosis not present

## 2016-07-29 DIAGNOSIS — Z86011 Personal history of benign neoplasm of the brain: Secondary | ICD-10-CM | POA: Diagnosis not present

## 2016-07-29 DIAGNOSIS — I10 Essential (primary) hypertension: Secondary | ICD-10-CM | POA: Diagnosis not present

## 2016-07-30 DIAGNOSIS — M6281 Muscle weakness (generalized): Secondary | ICD-10-CM | POA: Diagnosis not present

## 2016-07-30 DIAGNOSIS — Z993 Dependence on wheelchair: Secondary | ICD-10-CM | POA: Diagnosis not present

## 2016-07-30 DIAGNOSIS — R41841 Cognitive communication deficit: Secondary | ICD-10-CM | POA: Diagnosis not present

## 2016-07-30 DIAGNOSIS — Z86011 Personal history of benign neoplasm of the brain: Secondary | ICD-10-CM | POA: Diagnosis not present

## 2016-07-30 DIAGNOSIS — I10 Essential (primary) hypertension: Secondary | ICD-10-CM | POA: Diagnosis not present

## 2016-07-30 DIAGNOSIS — G40209 Localization-related (focal) (partial) symptomatic epilepsy and epileptic syndromes with complex partial seizures, not intractable, without status epilepticus: Secondary | ICD-10-CM | POA: Diagnosis not present

## 2016-07-31 ENCOUNTER — Ambulatory Visit
Admission: RE | Admit: 2016-07-31 | Discharge: 2016-07-31 | Disposition: A | Discharge status: Home / Self Care | Payer: Medicare Other | Source: Ambulatory Visit | Attending: Hematology and Oncology | Admitting: Hematology and Oncology

## 2016-07-31 DIAGNOSIS — R922 Inconclusive mammogram: Secondary | ICD-10-CM | POA: Diagnosis not present

## 2016-07-31 DIAGNOSIS — C50512 Malignant neoplasm of lower-outer quadrant of left female breast: Secondary | ICD-10-CM

## 2016-08-03 DIAGNOSIS — M6281 Muscle weakness (generalized): Secondary | ICD-10-CM | POA: Diagnosis not present

## 2016-08-03 DIAGNOSIS — G40209 Localization-related (focal) (partial) symptomatic epilepsy and epileptic syndromes with complex partial seizures, not intractable, without status epilepticus: Secondary | ICD-10-CM | POA: Diagnosis not present

## 2016-08-03 DIAGNOSIS — R41841 Cognitive communication deficit: Secondary | ICD-10-CM | POA: Diagnosis not present

## 2016-08-03 DIAGNOSIS — I10 Essential (primary) hypertension: Secondary | ICD-10-CM | POA: Diagnosis not present

## 2016-08-03 DIAGNOSIS — Z86011 Personal history of benign neoplasm of the brain: Secondary | ICD-10-CM | POA: Diagnosis not present

## 2016-08-03 DIAGNOSIS — Z993 Dependence on wheelchair: Secondary | ICD-10-CM | POA: Diagnosis not present

## 2016-08-03 NOTE — Telephone Encounter (Signed)
Pt was scheduled

## 2016-08-04 DIAGNOSIS — Z993 Dependence on wheelchair: Secondary | ICD-10-CM | POA: Diagnosis not present

## 2016-08-04 DIAGNOSIS — M6281 Muscle weakness (generalized): Secondary | ICD-10-CM | POA: Diagnosis not present

## 2016-08-04 DIAGNOSIS — G40209 Localization-related (focal) (partial) symptomatic epilepsy and epileptic syndromes with complex partial seizures, not intractable, without status epilepticus: Secondary | ICD-10-CM | POA: Diagnosis not present

## 2016-08-04 DIAGNOSIS — R41841 Cognitive communication deficit: Secondary | ICD-10-CM | POA: Diagnosis not present

## 2016-08-04 DIAGNOSIS — I10 Essential (primary) hypertension: Secondary | ICD-10-CM | POA: Diagnosis not present

## 2016-08-04 DIAGNOSIS — Z86011 Personal history of benign neoplasm of the brain: Secondary | ICD-10-CM | POA: Diagnosis not present

## 2016-08-05 DIAGNOSIS — I10 Essential (primary) hypertension: Secondary | ICD-10-CM | POA: Diagnosis not present

## 2016-08-05 DIAGNOSIS — G40209 Localization-related (focal) (partial) symptomatic epilepsy and epileptic syndromes with complex partial seizures, not intractable, without status epilepticus: Secondary | ICD-10-CM | POA: Diagnosis not present

## 2016-08-05 DIAGNOSIS — R41841 Cognitive communication deficit: Secondary | ICD-10-CM | POA: Diagnosis not present

## 2016-08-05 DIAGNOSIS — Z993 Dependence on wheelchair: Secondary | ICD-10-CM | POA: Diagnosis not present

## 2016-08-05 DIAGNOSIS — M6281 Muscle weakness (generalized): Secondary | ICD-10-CM | POA: Diagnosis not present

## 2016-08-05 DIAGNOSIS — Z86011 Personal history of benign neoplasm of the brain: Secondary | ICD-10-CM | POA: Diagnosis not present

## 2016-08-06 ENCOUNTER — Other Ambulatory Visit: Payer: Self-pay | Admitting: Family Medicine

## 2016-08-06 DIAGNOSIS — M6281 Muscle weakness (generalized): Secondary | ICD-10-CM | POA: Diagnosis not present

## 2016-08-06 DIAGNOSIS — I10 Essential (primary) hypertension: Secondary | ICD-10-CM | POA: Diagnosis not present

## 2016-08-06 DIAGNOSIS — G40209 Localization-related (focal) (partial) symptomatic epilepsy and epileptic syndromes with complex partial seizures, not intractable, without status epilepticus: Secondary | ICD-10-CM | POA: Diagnosis not present

## 2016-08-06 DIAGNOSIS — Z993 Dependence on wheelchair: Secondary | ICD-10-CM | POA: Diagnosis not present

## 2016-08-06 DIAGNOSIS — Z86011 Personal history of benign neoplasm of the brain: Secondary | ICD-10-CM | POA: Diagnosis not present

## 2016-08-06 DIAGNOSIS — R41841 Cognitive communication deficit: Secondary | ICD-10-CM | POA: Diagnosis not present

## 2016-08-07 ENCOUNTER — Other Ambulatory Visit: Payer: Self-pay | Admitting: Emergency Medicine

## 2016-08-07 MED ORDER — DIAZEPAM 5 MG PO TABS: 5.0000 mg | ORAL_TABLET | Freq: Every day | ORAL | 1 refills | Status: DC

## 2016-08-11 DIAGNOSIS — Z993 Dependence on wheelchair: Secondary | ICD-10-CM | POA: Diagnosis not present

## 2016-08-11 DIAGNOSIS — G40209 Localization-related (focal) (partial) symptomatic epilepsy and epileptic syndromes with complex partial seizures, not intractable, without status epilepticus: Secondary | ICD-10-CM | POA: Diagnosis not present

## 2016-08-11 DIAGNOSIS — Z86011 Personal history of benign neoplasm of the brain: Secondary | ICD-10-CM | POA: Diagnosis not present

## 2016-08-11 DIAGNOSIS — R41841 Cognitive communication deficit: Secondary | ICD-10-CM | POA: Diagnosis not present

## 2016-08-11 DIAGNOSIS — M6281 Muscle weakness (generalized): Secondary | ICD-10-CM | POA: Diagnosis not present

## 2016-08-11 DIAGNOSIS — I10 Essential (primary) hypertension: Secondary | ICD-10-CM | POA: Diagnosis not present

## 2016-08-12 DIAGNOSIS — I10 Essential (primary) hypertension: Secondary | ICD-10-CM | POA: Diagnosis not present

## 2016-08-12 DIAGNOSIS — R41841 Cognitive communication deficit: Secondary | ICD-10-CM | POA: Diagnosis not present

## 2016-08-12 DIAGNOSIS — Z993 Dependence on wheelchair: Secondary | ICD-10-CM | POA: Diagnosis not present

## 2016-08-12 DIAGNOSIS — Z86011 Personal history of benign neoplasm of the brain: Secondary | ICD-10-CM | POA: Diagnosis not present

## 2016-08-12 DIAGNOSIS — G40209 Localization-related (focal) (partial) symptomatic epilepsy and epileptic syndromes with complex partial seizures, not intractable, without status epilepticus: Secondary | ICD-10-CM | POA: Diagnosis not present

## 2016-08-12 DIAGNOSIS — M6281 Muscle weakness (generalized): Secondary | ICD-10-CM | POA: Diagnosis not present

## 2016-08-13 DIAGNOSIS — G40209 Localization-related (focal) (partial) symptomatic epilepsy and epileptic syndromes with complex partial seizures, not intractable, without status epilepticus: Secondary | ICD-10-CM | POA: Diagnosis not present

## 2016-08-13 DIAGNOSIS — R41841 Cognitive communication deficit: Secondary | ICD-10-CM | POA: Diagnosis not present

## 2016-08-13 DIAGNOSIS — Z993 Dependence on wheelchair: Secondary | ICD-10-CM | POA: Diagnosis not present

## 2016-08-13 DIAGNOSIS — Z86011 Personal history of benign neoplasm of the brain: Secondary | ICD-10-CM | POA: Diagnosis not present

## 2016-08-13 DIAGNOSIS — M6281 Muscle weakness (generalized): Secondary | ICD-10-CM | POA: Diagnosis not present

## 2016-08-13 DIAGNOSIS — I10 Essential (primary) hypertension: Secondary | ICD-10-CM | POA: Diagnosis not present

## 2016-08-18 ENCOUNTER — Telehealth: Payer: Self-pay | Admitting: Family Medicine

## 2016-08-18 DIAGNOSIS — G40209 Localization-related (focal) (partial) symptomatic epilepsy and epileptic syndromes with complex partial seizures, not intractable, without status epilepticus: Secondary | ICD-10-CM | POA: Diagnosis not present

## 2016-08-18 DIAGNOSIS — I10 Essential (primary) hypertension: Secondary | ICD-10-CM | POA: Diagnosis not present

## 2016-08-18 DIAGNOSIS — M6281 Muscle weakness (generalized): Secondary | ICD-10-CM | POA: Diagnosis not present

## 2016-08-18 DIAGNOSIS — Z86011 Personal history of benign neoplasm of the brain: Secondary | ICD-10-CM | POA: Diagnosis not present

## 2016-08-18 DIAGNOSIS — R41841 Cognitive communication deficit: Secondary | ICD-10-CM | POA: Diagnosis not present

## 2016-08-18 DIAGNOSIS — Z993 Dependence on wheelchair: Secondary | ICD-10-CM | POA: Diagnosis not present

## 2016-08-18 NOTE — Telephone Encounter (Signed)
Betsy with [facility] would like verbal for home health PT 2 wk / 4

## 2016-08-19 ENCOUNTER — Ambulatory Visit: Payer: PPO | Admitting: Neurology

## 2016-08-20 ENCOUNTER — Telehealth: Payer: Self-pay | Admitting: Neurology

## 2016-08-20 ENCOUNTER — Encounter: Payer: Self-pay | Admitting: Neurology

## 2016-08-20 NOTE — Telephone Encounter (Signed)
Message sent to Dr. Sethi. 

## 2016-08-20 NOTE — Telephone Encounter (Signed)
Patients husband [name] called stating patient had another seizure/stroke episode 08/19/16 around 8-9pm- did not go to hospital symptoms only lasted 15 minutes. Left arm was numb, left cheek and tongue were numb. Husband was home with patient at the time, after episode patient was back to normal within 15 minutes.

## 2016-08-20 NOTE — Telephone Encounter (Signed)
Okay to put in verbal order for home health PT? Please advise.

## 2016-08-20 NOTE — Telephone Encounter (Signed)
Thanks. Ask patient to keep scheduled appointment

## 2016-08-22 DIAGNOSIS — I10 Essential (primary) hypertension: Secondary | ICD-10-CM | POA: Diagnosis not present

## 2016-08-22 DIAGNOSIS — R41841 Cognitive communication deficit: Secondary | ICD-10-CM | POA: Diagnosis not present

## 2016-08-22 DIAGNOSIS — Z86011 Personal history of benign neoplasm of the brain: Secondary | ICD-10-CM | POA: Diagnosis not present

## 2016-08-22 DIAGNOSIS — G40209 Localization-related (focal) (partial) symptomatic epilepsy and epileptic syndromes with complex partial seizures, not intractable, without status epilepticus: Secondary | ICD-10-CM | POA: Diagnosis not present

## 2016-08-22 DIAGNOSIS — Z993 Dependence on wheelchair: Secondary | ICD-10-CM | POA: Diagnosis not present

## 2016-08-22 DIAGNOSIS — M6281 Muscle weakness (generalized): Secondary | ICD-10-CM | POA: Diagnosis not present

## 2016-08-24 DIAGNOSIS — Z86011 Personal history of benign neoplasm of the brain: Secondary | ICD-10-CM | POA: Diagnosis not present

## 2016-08-24 DIAGNOSIS — M6281 Muscle weakness (generalized): Secondary | ICD-10-CM | POA: Diagnosis not present

## 2016-08-24 DIAGNOSIS — Z993 Dependence on wheelchair: Secondary | ICD-10-CM | POA: Diagnosis not present

## 2016-08-24 DIAGNOSIS — R41841 Cognitive communication deficit: Secondary | ICD-10-CM | POA: Diagnosis not present

## 2016-08-24 DIAGNOSIS — I10 Essential (primary) hypertension: Secondary | ICD-10-CM | POA: Diagnosis not present

## 2016-08-24 DIAGNOSIS — G40209 Localization-related (focal) (partial) symptomatic epilepsy and epileptic syndromes with complex partial seizures, not intractable, without status epilepticus: Secondary | ICD-10-CM | POA: Diagnosis not present

## 2016-08-26 DIAGNOSIS — G40209 Localization-related (focal) (partial) symptomatic epilepsy and epileptic syndromes with complex partial seizures, not intractable, without status epilepticus: Secondary | ICD-10-CM | POA: Diagnosis not present

## 2016-08-26 DIAGNOSIS — M6281 Muscle weakness (generalized): Secondary | ICD-10-CM | POA: Diagnosis not present

## 2016-08-26 DIAGNOSIS — Z86011 Personal history of benign neoplasm of the brain: Secondary | ICD-10-CM | POA: Diagnosis not present

## 2016-08-26 DIAGNOSIS — Z993 Dependence on wheelchair: Secondary | ICD-10-CM | POA: Diagnosis not present

## 2016-08-26 DIAGNOSIS — I10 Essential (primary) hypertension: Secondary | ICD-10-CM | POA: Diagnosis not present

## 2016-08-26 DIAGNOSIS — R41841 Cognitive communication deficit: Secondary | ICD-10-CM | POA: Diagnosis not present

## 2016-08-28 DIAGNOSIS — C50512 Malignant neoplasm of lower-outer quadrant of left female breast: Secondary | ICD-10-CM | POA: Diagnosis not present

## 2016-08-31 ENCOUNTER — Other Ambulatory Visit: Payer: Self-pay | Admitting: Emergency Medicine

## 2016-08-31 ENCOUNTER — Telehealth: Payer: Self-pay | Admitting: Family Medicine

## 2016-08-31 DIAGNOSIS — Z993 Dependence on wheelchair: Secondary | ICD-10-CM | POA: Diagnosis not present

## 2016-08-31 DIAGNOSIS — M6281 Muscle weakness (generalized): Secondary | ICD-10-CM | POA: Diagnosis not present

## 2016-08-31 DIAGNOSIS — I10 Essential (primary) hypertension: Secondary | ICD-10-CM | POA: Diagnosis not present

## 2016-08-31 DIAGNOSIS — R41841 Cognitive communication deficit: Secondary | ICD-10-CM | POA: Diagnosis not present

## 2016-08-31 DIAGNOSIS — G40209 Localization-related (focal) (partial) symptomatic epilepsy and epileptic syndromes with complex partial seizures, not intractable, without status epilepticus: Secondary | ICD-10-CM | POA: Diagnosis not present

## 2016-08-31 DIAGNOSIS — Z86011 Personal history of benign neoplasm of the brain: Secondary | ICD-10-CM | POA: Diagnosis not present

## 2016-08-31 MED ORDER — BACLOFEN 20 MG PO TABS: ORAL_TABLET | ORAL | 2 refills | Status: DC

## 2016-08-31 NOTE — Telephone Encounter (Signed)
Pt has a new pharm [facility] [address]. Pt needs new rx baclofen #360 for 90 day  Supply w/refills

## 2016-08-31 NOTE — Telephone Encounter (Signed)
Rx sent in

## 2016-09-07 ENCOUNTER — Other Ambulatory Visit: Payer: Self-pay | Admitting: Emergency Medicine

## 2016-09-07 DIAGNOSIS — R41841 Cognitive communication deficit: Secondary | ICD-10-CM | POA: Diagnosis not present

## 2016-09-07 DIAGNOSIS — Z86011 Personal history of benign neoplasm of the brain: Secondary | ICD-10-CM | POA: Diagnosis not present

## 2016-09-07 DIAGNOSIS — Z993 Dependence on wheelchair: Secondary | ICD-10-CM | POA: Diagnosis not present

## 2016-09-07 DIAGNOSIS — G40209 Localization-related (focal) (partial) symptomatic epilepsy and epileptic syndromes with complex partial seizures, not intractable, without status epilepticus: Secondary | ICD-10-CM | POA: Diagnosis not present

## 2016-09-07 DIAGNOSIS — M6281 Muscle weakness (generalized): Secondary | ICD-10-CM | POA: Diagnosis not present

## 2016-09-07 DIAGNOSIS — I10 Essential (primary) hypertension: Secondary | ICD-10-CM | POA: Diagnosis not present

## 2016-09-07 MED ORDER — LORAZEPAM 1 MG PO TABS: 1.0000 mg | ORAL_TABLET | Freq: Two times a day (BID) | ORAL | 3 refills | Status: DC | PRN

## 2016-09-09 DIAGNOSIS — G40209 Localization-related (focal) (partial) symptomatic epilepsy and epileptic syndromes with complex partial seizures, not intractable, without status epilepticus: Secondary | ICD-10-CM | POA: Diagnosis not present

## 2016-09-09 DIAGNOSIS — Z86011 Personal history of benign neoplasm of the brain: Secondary | ICD-10-CM | POA: Diagnosis not present

## 2016-09-09 DIAGNOSIS — M6281 Muscle weakness (generalized): Secondary | ICD-10-CM | POA: Diagnosis not present

## 2016-09-09 DIAGNOSIS — I10 Essential (primary) hypertension: Secondary | ICD-10-CM | POA: Diagnosis not present

## 2016-09-09 DIAGNOSIS — Z993 Dependence on wheelchair: Secondary | ICD-10-CM | POA: Diagnosis not present

## 2016-09-09 DIAGNOSIS — R41841 Cognitive communication deficit: Secondary | ICD-10-CM | POA: Diagnosis not present

## 2016-09-14 ENCOUNTER — Telehealth: Payer: Self-pay | Admitting: Family Medicine

## 2016-09-14 DIAGNOSIS — M6281 Muscle weakness (generalized): Secondary | ICD-10-CM | POA: Diagnosis not present

## 2016-09-14 DIAGNOSIS — I10 Essential (primary) hypertension: Secondary | ICD-10-CM | POA: Diagnosis not present

## 2016-09-14 DIAGNOSIS — R41841 Cognitive communication deficit: Secondary | ICD-10-CM | POA: Diagnosis not present

## 2016-09-14 DIAGNOSIS — Z86011 Personal history of benign neoplasm of the brain: Secondary | ICD-10-CM | POA: Diagnosis not present

## 2016-09-14 DIAGNOSIS — Z993 Dependence on wheelchair: Secondary | ICD-10-CM | POA: Diagnosis not present

## 2016-09-14 DIAGNOSIS — G40209 Localization-related (focal) (partial) symptomatic epilepsy and epileptic syndromes with complex partial seizures, not intractable, without status epilepticus: Secondary | ICD-10-CM | POA: Diagnosis not present

## 2016-09-14 NOTE — Telephone Encounter (Signed)
Betsy from [facility] called stating that patient blood pressure is 172/98 and this has been the highest and all other vitals are within range.  Contact Info: [phone]

## 2016-09-14 NOTE — Telephone Encounter (Signed)
Spoke with [place] and gave her directions from Dr. Sherren Mocha. Will call in the am to f/u with pt blood pressure.

## 2016-09-15 ENCOUNTER — Telehealth: Payer: Self-pay | Admitting: Emergency Medicine

## 2016-09-15 NOTE — Telephone Encounter (Signed)
Spoke with pt husband and per Dr. Sherren Mocha pt should continue new dose of blood pressure medication, check blood pressure every morning, and keep a record. Pt is to call the office back in a few weeks with record or send record of blood pressure readings through mychart.

## 2016-09-15 NOTE — Telephone Encounter (Signed)
Spoke with Mr. Cassandria Santee in regards to pt blood pressure. He stated that her blood pressure was 158/82 this morning at 8am and that was before she took her blood pressure medication. Mr. Cassandria Santee also stated that since yesterday pt was fatigue but is now sleeping. Mr. Cassandria Santee said that he would check her B/P when she wakes up, I told him that I would give them a call back later today just to check to see how she is doing. Also told pt husband that if her blood pressure stays elevated to give the office a call back.

## 2016-09-15 NOTE — Telephone Encounter (Signed)
Spoke with pt husband and he stated that pt blood pressure was 148/82 at 8:00am, 11:30am 143/82, 1:30pm 148/82. Told pt that I would relay this to Dr. Sherren Mocha and give them a call back.

## 2016-09-16 ENCOUNTER — Encounter: Payer: Self-pay | Admitting: Family Medicine

## 2016-09-17 ENCOUNTER — Encounter: Payer: Self-pay | Admitting: Family Medicine

## 2016-09-17 ENCOUNTER — Telehealth: Payer: Self-pay | Admitting: Family Medicine

## 2016-09-17 DIAGNOSIS — G40209 Localization-related (focal) (partial) symptomatic epilepsy and epileptic syndromes with complex partial seizures, not intractable, without status epilepticus: Secondary | ICD-10-CM | POA: Diagnosis not present

## 2016-09-17 DIAGNOSIS — Z993 Dependence on wheelchair: Secondary | ICD-10-CM | POA: Diagnosis not present

## 2016-09-17 DIAGNOSIS — I10 Essential (primary) hypertension: Secondary | ICD-10-CM | POA: Diagnosis not present

## 2016-09-17 DIAGNOSIS — Z86011 Personal history of benign neoplasm of the brain: Secondary | ICD-10-CM | POA: Diagnosis not present

## 2016-09-17 DIAGNOSIS — R41841 Cognitive communication deficit: Secondary | ICD-10-CM | POA: Diagnosis not present

## 2016-09-17 DIAGNOSIS — M6281 Muscle weakness (generalized): Secondary | ICD-10-CM | POA: Diagnosis not present

## 2016-09-17 NOTE — Telephone Encounter (Signed)
° ° ° °  Betsy with [facility] called to say   Verbal orders to continue PT   2 week 3   Blood pressure at 2PM   172/92  148 /90 LAYING DOWN 25 MINUTES LATER

## 2016-09-18 ENCOUNTER — Encounter: Payer: Self-pay | Admitting: Family Medicine

## 2016-09-21 ENCOUNTER — Other Ambulatory Visit: Payer: Self-pay | Admitting: Emergency Medicine

## 2016-09-21 DIAGNOSIS — I10 Essential (primary) hypertension: Secondary | ICD-10-CM | POA: Diagnosis not present

## 2016-09-21 DIAGNOSIS — Z86011 Personal history of benign neoplasm of the brain: Secondary | ICD-10-CM | POA: Diagnosis not present

## 2016-09-21 DIAGNOSIS — R41841 Cognitive communication deficit: Secondary | ICD-10-CM | POA: Diagnosis not present

## 2016-09-21 DIAGNOSIS — M6281 Muscle weakness (generalized): Secondary | ICD-10-CM | POA: Diagnosis not present

## 2016-09-21 DIAGNOSIS — Z993 Dependence on wheelchair: Secondary | ICD-10-CM | POA: Diagnosis not present

## 2016-09-21 DIAGNOSIS — G40209 Localization-related (focal) (partial) symptomatic epilepsy and epileptic syndromes with complex partial seizures, not intractable, without status epilepticus: Secondary | ICD-10-CM | POA: Diagnosis not present

## 2016-09-21 MED ORDER — ATENOLOL 50 MG PO TABS: ORAL_TABLET | ORAL | 3 refills | Status: DC

## 2016-09-21 NOTE — Telephone Encounter (Signed)
Spoke with pt husband Mr. Cassandria Santee and per Dr. Sherren Mocha pt is to take 1 1/2 tab in the morning which has increased to 75 mg and 1 full tab at night 50 mg. Pt husband understood and will keep [place] updated on her readings.  Sent prescription into pharmacy.

## 2016-09-21 NOTE — Telephone Encounter (Signed)
Spoke with Gwinda Passe and Per Dr. Sherren Mocha it is okay to continue PT

## 2016-09-22 DIAGNOSIS — Z86011 Personal history of benign neoplasm of the brain: Secondary | ICD-10-CM | POA: Diagnosis not present

## 2016-09-22 DIAGNOSIS — Z993 Dependence on wheelchair: Secondary | ICD-10-CM | POA: Diagnosis not present

## 2016-09-22 DIAGNOSIS — G40209 Localization-related (focal) (partial) symptomatic epilepsy and epileptic syndromes with complex partial seizures, not intractable, without status epilepticus: Secondary | ICD-10-CM | POA: Diagnosis not present

## 2016-09-22 DIAGNOSIS — I10 Essential (primary) hypertension: Secondary | ICD-10-CM | POA: Diagnosis not present

## 2016-09-22 DIAGNOSIS — M6281 Muscle weakness (generalized): Secondary | ICD-10-CM | POA: Diagnosis not present

## 2016-09-22 DIAGNOSIS — R41841 Cognitive communication deficit: Secondary | ICD-10-CM | POA: Diagnosis not present

## 2016-09-28 DIAGNOSIS — Z993 Dependence on wheelchair: Secondary | ICD-10-CM | POA: Diagnosis not present

## 2016-09-28 DIAGNOSIS — M6281 Muscle weakness (generalized): Secondary | ICD-10-CM | POA: Diagnosis not present

## 2016-09-28 DIAGNOSIS — G40209 Localization-related (focal) (partial) symptomatic epilepsy and epileptic syndromes with complex partial seizures, not intractable, without status epilepticus: Secondary | ICD-10-CM | POA: Diagnosis not present

## 2016-09-28 DIAGNOSIS — R41841 Cognitive communication deficit: Secondary | ICD-10-CM | POA: Diagnosis not present

## 2016-09-28 DIAGNOSIS — Z86011 Personal history of benign neoplasm of the brain: Secondary | ICD-10-CM | POA: Diagnosis not present

## 2016-09-28 DIAGNOSIS — I10 Essential (primary) hypertension: Secondary | ICD-10-CM | POA: Diagnosis not present

## 2016-09-30 DIAGNOSIS — Z993 Dependence on wheelchair: Secondary | ICD-10-CM | POA: Diagnosis not present

## 2016-09-30 DIAGNOSIS — G40209 Localization-related (focal) (partial) symptomatic epilepsy and epileptic syndromes with complex partial seizures, not intractable, without status epilepticus: Secondary | ICD-10-CM | POA: Diagnosis not present

## 2016-09-30 DIAGNOSIS — I10 Essential (primary) hypertension: Secondary | ICD-10-CM | POA: Diagnosis not present

## 2016-09-30 DIAGNOSIS — M6281 Muscle weakness (generalized): Secondary | ICD-10-CM | POA: Diagnosis not present

## 2016-09-30 DIAGNOSIS — R41841 Cognitive communication deficit: Secondary | ICD-10-CM | POA: Diagnosis not present

## 2016-09-30 DIAGNOSIS — Z86011 Personal history of benign neoplasm of the brain: Secondary | ICD-10-CM | POA: Diagnosis not present

## 2016-10-01 ENCOUNTER — Encounter: Payer: Self-pay | Admitting: Neurology

## 2016-10-01 ENCOUNTER — Ambulatory Visit (INDEPENDENT_AMBULATORY_CARE_PROVIDER_SITE_OTHER): Payer: Medicare Other | Admitting: Neurology

## 2016-10-01 VITALS — BP 148/84 | HR 57

## 2016-10-01 DIAGNOSIS — G459 Transient cerebral ischemic attack, unspecified: Secondary | ICD-10-CM

## 2016-10-01 NOTE — Patient Instructions (Addendum)
I had a long d/w patient and her husband about  her recent TIAs and small vessel disease, risk for recurrent stroke/TIAs, personally independently reviewed imaging studies and stroke evaluation results and answered questions.Continue Plavix  for secondary stroke prevention and maintain strict control of hypertension with blood pressure goal below 130/90, diabetes with hemoglobin A1c goal below 6.5% and lipids with LDL cholesterol goal below 70 mg/dL. I also advised the patient to eat a healthy diet with plenty of whole grains, cereals, fruits and vegetables,   and maintain ideal body weight Followup in the future with me in  6 months or call earlier if necessary Stroke Prevention Some medical conditions and behaviors are associated with an increased chance of having a stroke. You may prevent a stroke by making healthy choices and managing medical conditions. How can I reduce my risk of having a stroke?  Stay physically active. Get at least 30 minutes of activity on most or all days.  Do not smoke. It may also be helpful to avoid exposure to secondhand smoke.  Limit alcohol use. Moderate alcohol use is considered to be:  No more than 2 drinks per day for men.  No more than 1 drink per day for nonpregnant women.  Eat healthy foods. This involves:  Eating 5 or more servings of fruits and vegetables a day.  Making dietary changes that address high blood pressure (hypertension), high cholesterol, diabetes, or obesity.  Manage your cholesterol levels.  Making food choices that are high in fiber and low in saturated fat, trans fat, and cholesterol may control cholesterol levels.  Take any prescribed medicines to control cholesterol as directed by your health care provider.  Manage your diabetes.  Controlling your carbohydrate and sugar intake is recommended to manage diabetes.  Take any prescribed medicines to control diabetes as directed by your health care provider.  Control your  hypertension.  Making food choices that are low in salt (sodium), saturated fat, trans fat, and cholesterol is recommended to manage hypertension.  Ask your health care provider if you need treatment to lower your blood pressure. Take any prescribed medicines to control hypertension as directed by your health care provider.  If you are 39-68 years of age, have your blood pressure checked every 3-5 years. If you are 50 years of age or older, have your blood pressure checked every year.  Maintain a healthy weight.  Reducing calorie intake and making food choices that are low in sodium, saturated fat, trans fat, and cholesterol are recommended to manage weight.  Stop drug abuse.  Avoid taking birth control pills.  Talk to your health care provider about the risks of taking birth control pills if you are over 28 years old, smoke, get migraines, or have ever had a blood clot.  Get evaluated for sleep disorders (sleep apnea).  Talk to your health care provider about getting a sleep evaluation if you snore a lot or have excessive sleepiness.  Take medicines only as directed by your health care provider.  For some people, aspirin or blood thinners (anticoagulants) are helpful in reducing the risk of forming abnormal blood clots that can lead to stroke. If you have the irregular heart rhythm of atrial fibrillation, you should be on a blood thinner unless there is a good reason you cannot take them.  Understand all your medicine instructions.  Make sure that other conditions (such as anemia or atherosclerosis) are addressed. Get help right away if:  You have sudden weakness or numbness of  the face, arm, or leg, especially on one side of the body.  Your face or eyelid droops to one side.  You have sudden confusion.  You have trouble speaking (aphasia) or understanding.  You have sudden trouble seeing in one or both eyes.  You have sudden trouble walking.  You have dizziness.  You  have a loss of balance or coordination.  You have a sudden, severe headache with no known cause.  You have new chest pain or an irregular heartbeat. Any of these symptoms may represent a serious problem that is an emergency. Do not wait to see if the symptoms will go away. Get medical help at once. Call your local emergency services (911 in [place] Do not drive yourself to the hospital.  This information is not intended to replace advice given to you by your health care provider. Make sure you discuss any questions you have with your health care provider. Document Released: 09/10/2004 Document Revised: 01/09/2016 Document Reviewed: 02/03/2013 Elsevier Interactive Patient Education  2017 [employer].

## 2016-10-01 NOTE — Progress Notes (Signed)
PATIENT: Stacy Diaz DOB: 09-24-1950  REASON FOR VISIT: routine follow up for stroke, partial seizures HISTORY FROM: patient  HISTORY OF PRESENT ILLNESS: Stacy Diaz is a 66 year old female with history of previous meningioma resection with subsequent spastic paraparesis who developed left arm weakness and facial droop on the morning of 08/25/2012. She had multiple episodes in the past similar to the episode above but this one had been persistent. MRI was not an option since she had history of no clips to 2 meningioma resection. Since her surgery she has had trouble with weakness from waist down. At her baseline she had trouble angulating for the past 4 years but could bear weight on her legs but needed help with transfers. Lately her legs have been nonweight bearing at all.  She was admitted to [facility] for workup and CT was negative for acute infarct. She was discharged to [facility] after 3 days. On the third day there her symptoms became worse and she was readmitted to the hospital. On the second day of her second hospitalization she had a seizure that was witnessed and included loss of consciousness with a blank stare and lasted approximately 45 minutes. She suffered another seizure that was shorter in length approximately 10 minutes on the following day. She has not had any known recurrent seizures since that time. She was discharged on Keppra 750 mg twice a day and continues with that dose without any side effects.  She was discharged on Plavix 75 mg daily and continues with that dose. Patient denies medication side effects, with no signs of bleeding. Patient is in outpatient therapy.   Update 07/11/13 (PS): She is seen for her first office followup visit following hospital admission on 05/31/39 and with sudden onset of left facial droop and left upper weakness and numbness. She started improving by the time EMS came and she was back to nearly baseline within 1 hour. CT scan of  the head showed no acute abnormality. MRI could not be done because she has a metal surgical plate following meningioma resection surgery 10 years ago. She had EEG which was unremarkable. She was previously on Keppra 750 twice daily the dose of which was increased to 1 g twice daily. She states she has finished outpatient physical and occupational therapy and is almost 90% back to her baseline. She has spastic paraplegia following meningioma surgery 10 years ago and her similar episode left face and arm weakness possibly right brain stroke in January 2014. She is tolerating increase to Keppra but does complain of mild tiredness which seemed to be improving but is not back to her baseline. She is also tolerating Plavix without bleeding, bruising or other side effects. She states her blood pressure is well controlled, slightly elevated at 139/76 today. She had EEG done on 01/05/13 which showed no evidence of seizure activity.   UPDATE 10/18/13 (LL): She returns to the office for follow up, she reports she is doing well, back to her baseline. Gets very tired by the end of the day and due to core muscle weakness she is often leaning to the right in her wheelchair by the late afternoon. She has a new aide at home who is starting to help her do crunches and resistance band exercises. She is tolerating Keppra well with no recurrent partial seizures. Continues on Plavix, patient denies medication side effects, with no signs of bleeding.   UPDATE 04/27/14 (LL): She returns for stroke followup, has been well except  recently had a spell where she felt strange, called for her husband and he found her slumped in her chair and "out of it." They do not think she lost consciousness, and episode lasted less than 10 minutes and she was back to normal. She states that when she is hungry, she sometimes starts to feel poorly, anxious, and shaky and wonders if her blood sugar is too low. The feeling disappears when she eats. They do not  have a blood glucose meter. She has been compliant with taking her Keppra and all other medications. Her blood pressure is well controlled, it is 134/80 in the office today. She is tolerating Plavix well with no signs of significant bleeding or bruising. Update 10/31/2014 : She returns for follow-up after last visit 6 months ago accompanied by husband. She had 2 episodes of transient left upper extremity numbness lasting 10 minutes 3 weeks apart in August 2015. She also had an episode of seizure in September 2015 when she must have violent 3 hit the joystick off her electric wheelchair. At that time Keppra was changed to Vimpat which she takes in a dose of 50 mg twice daily. She does complain of tired numbness but this may be related to her recent diagnosis of breast cancer for which she has undergone lumpectomy and has received 4 weeks of radiation. She is currently on antiestrogen pill. She states her blood pressure has been good. Update 05/14/2015 : She returns for follow-up after last visit 6 months ago. She is doing well without recurrent TIA symptoms or seizures. She is tolerating Vimpat 100 mg twice daily without any side effects. She remains on baclofen 20 mg 4 times daily which seems to work well for her spasticity and when it wears off she notices a difference. She has been started on Arimidex by her cancer doctor and feels that this may have increased stiffness. She plans to discuss this with her cancer doctor at the next visit. She remains on Plavix which is tolerating well without any bleeding episodes but does have minor bruising. She is also on Lipitor without muscle aches or pains. She is due for follow-up lipid profile in 2 months at next visit with primary doctor. She remains wheelchair bound since her meningioma surgery and can help with transfers and can  bear weight. but cannot walk Update 04/17/2016 :  She returns for follow-up after last visit a year ago. She is accompanied by husband. Patient  states she could not afford the Vimpat and hence was switched to Trileptal 300 mg twice daily. She complains of little bit of drowsiness still with that but it's getting better. She continues to have significant spasticity in stiffness of her legs. She has trouble bending her legs and putting it on the foot rest of her electric wheelchair. She currently takes baclofen 20 mg 4 times daily and can take To hospital if needed. She also takes Robaxin 503 times daily. She however tends to take all the pills together at the same time. I advised her to try to stagger the Robaxin and the baclofen that she is not as sleepy. Patient has some occasional bad days and she feels very weak and tired and can barely get up or concentrate. Update 10/01/2016 : She returns for follow-up after recent hospital admission for TIA in October 2017. She presented with sudden onset of decreased sensation in the left face and arm but mild weakness. Symptoms improved quickly after arrival to the hospital. CT scan of the head  showed no acute abnormality and  MRI scan could not be obtain due to old surgical clips at the site of prior meningioma surgery which are not MRI compatible. Carotid ultrasound showed no extra cranialStenosis. CT Angiogram of the Brain and Neck Showed Only Mild Atherosclerosis. Transthoracic Echo Showed Normal Ejection Fraction. LDL Cholesterol Was 79 Mg Percent. Hemoglobin A1c Was 5.4. Patient Was on Plavix Prior to Admission Which Was Continued. She Was on Trileptal for Seizures and Level Was Checked and It Was Optimal at 11mg %. Patient Was Discharged Home and Has Been Getting Home Physical and Occupation Therapy Which She Has Recently Finished. She States She Had Another Similar Episode in January 2018 When She Had Transient Left Face and Upper Extremity Numbness and Weakness Which Lasted around 10 Minutes. She Felt Left Side of Her Tongue Was Also Numb. She Is Fully Awake and Did Not Lose Consciousness and Fell Feel  Disorientation or Have Any Headache. I Have Discussed with Patient Possibility of Switching Plavix to Aggrenox but after Discussion of Possible Side Effects of Headache She Does Not Want to Change. Patient Remains Paraplegic. She Can Stand and Bear Weight on Her Legs If She Wears Braces. She Needs Some Help with Transferring from a Wheelchair to the Bed by Using a Transfer Desk. She Is Tolerating Plavix without Any Bleeding or Bruising. REVIEW OF SYSTEMS: Full 14 system review of systems performed and notable only for ,  constipation, incontinence of bladder, joint and back pain, walking difficulty, easy bruising, numbness, seizure, weakness, facial drooping and all other systems negative  ALLERGIES: No Known Allergies  HOME MEDICATIONS: Outpatient Medications Prior to Visit  Medication Sig Dispense Refill  . atenolol (TENORMIN) 50 MG tablet Pt is to take 1 1/2 (75mg ) tab in the morning and 1 (50mg ) full tab at night 200 tablet 3  . atorvastatin (LIPITOR) 10 MG tablet Take 1 tablet (10 mg total) by mouth daily. (Patient taking differently: Take 10 mg by mouth every morning. ) 90 tablet 3  . baclofen (LIORESAL) 20 MG tablet TAKE 1 TABLET (20 MG TOTAL) BY MOUTH 4 (FOUR) TIMES DAILY. 360 tablet 2  . clopidogrel (PLAVIX) 75 MG tablet Take 1 tablet (75 mg total) by mouth daily with breakfast. 90 tablet 3  . diazepam (VALIUM) 5 MG tablet Take 1 tablet (5 mg total) by mouth at bedtime. 90 tablet 1  . letrozole (FEMARA) 2.5 MG tablet TAKE 1 TABLET (2.5 MG TOTAL) BY MOUTH DAILY. 90 tablet 2  . LORazepam (ATIVAN) 1 MG tablet Take 1 tablet (1 mg total) by mouth 2 (two) times daily as needed. for anxiety 180 tablet 3  . methocarbamol (ROBAXIN) 500 MG tablet Take 1 tablet (500 mg total) by mouth 3 (three) times daily. 300 tablet 3  . Multiple Vitamin (MULTIVITAMIN WITH MINERALS) TABS tablet Take 1 tablet by mouth every morning.     Marland Kitchen OVER THE COUNTER MEDICATION Apply 1 application topically daily as needed  (pain). Aspercreme with 5% lidocaine    . Oxcarbazepine (TRILEPTAL) 300 MG tablet Take 0.5 tablets (150 mg total) by mouth 2 (two) times daily. Start with 1/2 tablet twice daily x 2 weeks then one tablet twice daily (Patient taking differently: Take 300 mg by mouth 2 (two) times daily. ) 180 tablet 3  . oxyCODONE-acetaminophen (PERCOCET) 10-325 MG tablet Take 1 tablet by mouth 3 (three) times daily as needed for pain. 270 tablet 0  . polyethylene glycol (MIRALAX / GLYCOLAX) packet Take 17 g by mouth daily as  needed (for constipation). Mix with liquid and drink    . zinc oxide (BALMEX) 11.3 % CREA cream Apply 1 application topically at bedtime as needed (IRRITATION).     Marland Kitchen atenolol (TENORMIN) 50 MG tablet 1 tablet in the morning....... half a tab at bedtime (Patient not taking: Reported on 10/01/2016) 150 tablet 3  . docusate sodium (COLACE) 100 MG capsule Take 100 mg by mouth 2 (two) times daily.    . Melatonin 3 MG TABS Take 1 tablet (3 mg total) by mouth at bedtime. (Patient not taking: Reported on 10/01/2016) 90 tablet 3   No facility-administered medications prior to visit.     PHYSICAL EXAM Vitals:   10/01/16 1204  BP: (!) 148/84  BP Location: Right Arm  Patient Position: Sitting  Cuff Size: Normal  Pulse: (!) 57    GENERAL EXAM: Pleasant middle-age Caucasian lady Patient is in no distress, pleasant, wheelchair bound  HEAD: Symmetric facial features.  EARS, NOSE, and THROAT: Normal.  NECK: Supple, no JVD  RESPIRATORY: Lungs CTA.  CARDIOVASCULAR: Regular rate and rhythm, no murmurs, no carotid bruits  SKIN: No rash, no bruising   NEUROLOGIC:  MENTAL STATUS: awake, alert and oriented to person, place and time, language fluent, comprehension intact, naming intact  CRANIAL NERVE: pupils equal and reactive to light, visual fields full to confrontation, mild facial asymmetry on the left, uvula midline, shoulder shrug symmetric, tongue midline.  MOTOR: Significant weakness in right and  left lower extremities with only 1/5 strength hip extension ,2/5 knee extension and 0/5 at the   ankles and toes. Tone is increase in the both lower extremities, with significant spasticity. 4/5 strength left upper extremity, fine finger movements decreased on left  SENSORY: normal and symmetric to light touch . No sensory level on the trunk COORDINATION: finger-nose-finger with mild ataxia on left, normal on right  REFLEXES: deep tendon reflexes BUE present and symmetric 2+, BLE brisk 2= but difficult to elicit  GAIT/STATION: Wheelchair-bound.   ASSESSMENT: Ms. Jericah Steinhilber is a 66 year old female with history of previous meningioma resection with subsequent spastic paraparesis  In 1998 who developed left arm weakness and facial droop on the morning of 08/25/2012. Felt to be stroke although never confirmed on CT. She experienced 2 partial complex seizures while in the hospital and was discharged on Keppra 750 mg twice a day. She has had a recurrent seizure in September 2015 and was changed from [place] to [place] but could not afford it hence switched to trileptal and seems to be doing well on it. 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  PLAN:  I had a long d/w patient and her husband about  her recent TIAs and small vessel disease, risk for recurrent stroke/TIAs, personally independently reviewed imaging studies and stroke evaluation results and answered questions.Continue Plavix  for secondary stroke prevention and maintain strict control of hypertension with blood pressure goal below 130/90, diabetes with hemoglobin A1c goal below 6.5% and lipids with LDL cholesterol goal below 70 mg/dL. I also advised the patient to eat a healthy diet with plenty of whole grains, cereals, fruits and vegetables,   and maintain ideal body weight Followup in the  future with me in  6 months or call earlier if necessary. Greater than 50% time during this 25 minute visit was spent on counseling and coordination of care  about her TIA, explanation of diagnosis and treatment plan and answering questions  Antony Contras, MD  [facility] [address] [place], [place] [id number]       Note: This document was prepared with digital dictation and possible smart phrase technology. Any transcriptional errors that result from this process are unintentional.

## 2016-10-08 ENCOUNTER — Telehealth: Payer: Self-pay | Admitting: Family Medicine

## 2016-10-08 ENCOUNTER — Other Ambulatory Visit: Payer: Self-pay | Admitting: Family Medicine

## 2016-10-08 NOTE — Telephone Encounter (Signed)
° ° ° °  Pt request refill of the following: ° °oxyCODONE-acetaminophen (PERCOCET) 10-325 MG tablet ° °Phamacy: °

## 2016-10-09 NOTE — Telephone Encounter (Signed)
Refill is not due until 10/25/2016. So I will hold on to the message until Dr. Sherren Mocha returns and let him address it.

## 2016-10-13 MED ORDER — OXYCODONE-ACETAMINOPHEN 10-325 MG PO TABS: 1.0000 | ORAL_TABLET | Freq: Three times a day (TID) | ORAL | 0 refills | Status: DC | PRN

## 2016-10-13 NOTE — Telephone Encounter (Signed)
Refill for one month #90 until Dr Sherren Mocha back.

## 2016-10-13 NOTE — Telephone Encounter (Signed)
Pt needs percocet

## 2016-10-13 NOTE — Telephone Encounter (Signed)
Last filled on 07/27/16.  Please advise.  Thanks!!

## 2016-10-13 NOTE — Telephone Encounter (Signed)
Prescription is ready for pick up.  Tried to reach her by telephone.  Left a message for a return call.  Pt needs to be notified that Dr. Elease Hashimoto has only given a 30 day supply in place of Dr. Sherren Mocha.

## 2016-10-14 NOTE — Telephone Encounter (Signed)
Mr. Hruza notified that rx is available for pick up at the front desk.

## 2016-10-14 NOTE — Telephone Encounter (Signed)
° ° ° °  pt husband called back and is aware that rx is not do for  pick up till 10/25/16

## 2016-11-10 ENCOUNTER — Telehealth: Payer: Self-pay | Admitting: Family Medicine

## 2016-11-10 MED ORDER — OXYCODONE-ACETAMINOPHEN 10-325 MG PO TABS: 1.0000 | ORAL_TABLET | Freq: Three times a day (TID) | ORAL | 0 refills | Status: DC | PRN

## 2016-11-10 NOTE — Telephone Encounter (Signed)
Printed for Dr. Sherren Mocha to sign.

## 2016-11-10 NOTE — Telephone Encounter (Signed)
Printed for Dr. Sherren Mocha to review.

## 2016-11-10 NOTE — Telephone Encounter (Signed)
Pt needs new rxs oxycodone for next 3 months

## 2016-11-11 NOTE — Telephone Encounter (Signed)
Left a message for [name] to pick up at the front desk.

## 2016-12-22 ENCOUNTER — Ambulatory Visit (HOSPITAL_BASED_OUTPATIENT_CLINIC_OR_DEPARTMENT_OTHER): Payer: Medicare Other | Admitting: Hematology and Oncology

## 2016-12-22 ENCOUNTER — Encounter: Payer: Self-pay | Admitting: Hematology and Oncology

## 2016-12-22 DIAGNOSIS — M2550 Pain in unspecified joint: Secondary | ICD-10-CM | POA: Diagnosis not present

## 2016-12-22 DIAGNOSIS — M791 Myalgia: Secondary | ICD-10-CM | POA: Diagnosis not present

## 2016-12-22 DIAGNOSIS — C50512 Malignant neoplasm of lower-outer quadrant of left female breast: Secondary | ICD-10-CM | POA: Diagnosis not present

## 2016-12-22 DIAGNOSIS — Z79811 Long term (current) use of aromatase inhibitors: Secondary | ICD-10-CM

## 2016-12-22 DIAGNOSIS — Z8673 Personal history of transient ischemic attack (TIA), and cerebral infarction without residual deficits: Secondary | ICD-10-CM | POA: Diagnosis not present

## 2016-12-22 DIAGNOSIS — Z170 Estrogen receptor positive status [ER+]: Secondary | ICD-10-CM

## 2016-12-22 MED ORDER — LETROZOLE 2.5 MG PO TABS: 2.5000 mg | ORAL_TABLET | Freq: Every day | ORAL | 4 refills | Status: DC

## 2016-12-22 NOTE — Assessment & Plan Note (Signed)
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 Oncotype DX recurrence score 16; 10% risk of recurrence, low risk did not need chemotherapy status post radiation therapy completed January 2016, started antiestrogen therapy with Arimidex 1 mg daily 09/07/2014 Switched to letrozole November 2016.  letrozole toxicities: Patient has chronic muscle stiffness and aches. These symptoms are much better on letrozole then on anastrozole.  Seizures/CVA: Hospitalization October 2017 Myalgias and arthralgias:  tried tonic water  Breast Cancer Surveillance: 1. Breast exam08/03/2016: Left breast nipple retraction and tenderness along the surgical scar with nodularity along the scar but beyond that there are no lumps or nodules in the breasts. 2. Mammogram12/15/2017 No abnormalities. Postsurgical changes. Breast density category C. I recommended that she get 3-D mammograms for surveillance.  Return to clinic in 1 year for follow-up

## 2016-12-22 NOTE — Progress Notes (Signed)
Patient Care Team: Dorena Cookey, MD as PCP - General Jovita Kussmaul, MD as Consulting Physician (General Surgery) Nicholas Lose, MD as Consulting Physician (Hematology and Oncology) Kyung Rudd, MD as Consulting Physician (Radiation Oncology)  DIAGNOSIS:  Encounter Diagnosis  Name Primary?  . Malignant neoplasm of lower-outer quadrant of left breast of female, estrogen receptor positive (Stacy Diaz)     SUMMARY OF ONCOLOGIC HISTORY:   Breast cancer of lower-outer quadrant of left female breast (Stacy Diaz)   05/21/2014 Mammogram    Mammogram and Ultrasound: Left breast 2 cm complex cystic asymmetric abnormality      05/25/2014 Initial Diagnosis    Invasive mammary cancer with lobular features with perineural invasion, grade 2, ER positive PR positive HER-2 negative, Ki-67 25%      06/22/2014 Surgery    Breast lumpectomy: Invasive lobular carcinoma, grade 3, 2.7 cm, with LCIS, ER positive, PR positive, HER-2 negative, Ki-67 65%, T2, N0, M0 stage II A3 SLN negative; oncotype 16 low risk 10% ROR      07/30/2014 - 08/28/2014 Radiation Therapy    Adjuvant radiation      09/07/2014 -  Anti-estrogen oral therapy    Anastrazole 1 mg daily switch to letrozole 06/25/2015 for myalgias      06/02/2016 - 06/05/2016 Hospital Admission    TIA/CVA/seizures       CHIEF COMPLIANT: Follow-up of breast cancer on anastrozole  INTERVAL HISTORY: Stacy Diaz is a 66 year old lady with multiple medical issues including TIA and being wheelchair bound. She is currently on anastrozole therapy. She does have muscle stiffness especially in the right leg. Sometimes she is not even able to extend the leg. She has been taking the letrozole at night and this appears to be the best time for her. She denies any hot flashes. She does feel a slight lumpiness in the left breast 6:00 position.  REVIEW OF SYSTEMS:   Constitutional: Denies fevers, chills or abnormal weight loss Eyes: Denies blurriness of vision Ears,  nose, mouth, throat, and face: Denies mucositis or sore throat Respiratory: Denies cough, dyspnea or wheezes Cardiovascular: Denies palpitation, chest discomfort Gastrointestinal:  Denies nausea, heartburn or change in bowel habits Skin: Denies abnormal skin rashes Lymphatics: Denies new lymphadenopathy or easy bruising Neurological: Bilateral lower extremity weakness  , upper extremity weakness as well Behavioral/Psych: Mood is stable, no new changes  Extremities: No lower extremity edema Breast: Left breast nodularity 6:00 position All other systems were reviewed with the patient and are negative.  I have reviewed the past medical history, past surgical history, social history and family history with the patient and they are unchanged from previous note.  ALLERGIES:  has No Known Allergies.  MEDICATIONS:  Current Outpatient Prescriptions  Medication Sig Dispense Refill  . atenolol (TENORMIN) 50 MG tablet Pt is to take 1 1/2 (64m) tab in the morning and 1 (513m full tab at night 200 tablet 3  . atorvastatin (LIPITOR) 10 MG tablet Take 1 tablet (10 mg total) by mouth daily. (Patient taking differently: Take 10 mg by mouth every morning. ) 90 tablet 3  . baclofen (LIORESAL) 20 MG tablet TAKE 1 TABLET (20 MG TOTAL) BY MOUTH 4 (FOUR) TIMES DAILY. 360 tablet 2  . clopidogrel (PLAVIX) 75 MG tablet Take 1 tablet (75 mg total) by mouth daily with breakfast. 90 tablet 3  . diazepam (VALIUM) 5 MG tablet Take 1 tablet (5 mg total) by mouth at bedtime. 90 tablet 1  . letrozole (FEMARA) 2.5 MG tablet TAKE  1 TABLET (2.5 MG TOTAL) BY MOUTH DAILY. 90 tablet 2  . LORazepam (ATIVAN) 1 MG tablet Take 1 tablet (1 mg total) by mouth 2 (two) times daily as needed. for anxiety 180 tablet 3  . methocarbamol (ROBAXIN) 500 MG tablet Take 1 tablet (500 mg total) by mouth 3 (three) times daily. 300 tablet 3  . Multiple Vitamin (MULTIVITAMIN WITH MINERALS) TABS tablet Take 1 tablet by mouth every morning.     Marland Kitchen  OVER THE COUNTER MEDICATION Apply 1 application topically daily as needed (pain). Aspercreme with 5% lidocaine    . Oxcarbazepine (TRILEPTAL) 300 MG tablet Take 0.5 tablets (150 mg total) by mouth 2 (two) times daily. Start with 1/2 tablet twice daily x 2 weeks then one tablet twice daily (Patient taking differently: Take 300 mg by mouth 2 (two) times daily. ) 180 tablet 3  . oxyCODONE-acetaminophen (PERCOCET) 10-325 MG tablet Take 1 tablet by mouth 3 (three) times daily as needed for pain. 270 tablet 0  . polyethylene glycol (MIRALAX / GLYCOLAX) packet Take 17 g by mouth daily as needed (for constipation). Mix with liquid and drink    . senna (SENOKOT) 8.6 MG TABS tablet Take 1 tablet by mouth.    . zinc oxide (BALMEX) 11.3 % CREA cream Apply 1 application topically at bedtime as needed (IRRITATION).      No current facility-administered medications for this visit.     PHYSICAL EXAMINATION: ECOG PERFORMANCE STATUS: 3 - Symptomatic, >50% confined to bed  Vitals:   12/22/16 1403  BP: (!) 164/88  Pulse: 63  Resp: 18  Temp: 98.5 F (36.9 C)   Filed Weights    GENERAL:alert, no distress and comfortable SKIN: skin color, texture, turgor are normal, no rashes or significant lesions EYES: normal, Conjunctiva are pink and non-injected, sclera clear OROPHARYNX:no exudate, no erythema and lips, buccal mucosa, and tongue normal  NECK: supple, thyroid normal size, non-tender, without nodularity LYMPH:  no palpable lymphadenopathy in the cervical, axillary or inguinal LUNGS: clear to auscultation and percussion with normal breathing effort HEART: regular rate & rhythm and no murmurs and no lower extremity edema ABDOMEN:abdomen soft, non-tender and normal bowel sounds MUSCULOSKELETAL:no cyanosis of digits and no clubbing  NEURO: alert & oriented x 3 with fluent speech, no focal motor/sensory deficits EXTREMITIES: No lower extremity edema BREAST: No palpable masses or nodules in either right or  left breasts. No palpable axillary supraclavicular or infraclavicular adenopathy no breast tenderness or nipple discharge. (exam performed in the presence of a chaperone)  LABORATORY DATA:  I have reviewed the data as listed   Chemistry      Component Value Date/Time   NA 134 (L) 06/03/2016 0408   NA 137 04/17/2016 1237   NA 143 05/30/2014 1212   K 3.8 06/03/2016 0408   K 3.5 05/30/2014 1212   CL 101 06/03/2016 0408   CO2 22 06/03/2016 0408   CO2 28 05/30/2014 1212   BUN 8 06/03/2016 0408   BUN 13 04/17/2016 1237   BUN 14.6 05/30/2014 1212   CREATININE 0.49 06/03/2016 0408   CREATININE 0.7 05/30/2014 1212      Component Value Date/Time   CALCIUM 9.4 06/03/2016 0408   CALCIUM 9.9 05/30/2014 1212   ALKPHOS 86 06/02/2016 1430   ALKPHOS 75 05/30/2014 1212   AST 32 06/02/2016 1430   AST 26 05/30/2014 1212   ALT 22 06/02/2016 1430   ALT 34 05/30/2014 1212   BILITOT 1.4 (H) 06/02/2016 1430   BILITOT  0.49 05/30/2014 1212       Lab Results  Component Value Date   WBC 6.7 06/03/2016   HGB 14.2 06/03/2016   HCT 42.6 06/03/2016   MCV 88.9 06/03/2016   PLT 197 06/03/2016   NEUTROABS 3.4 06/03/2016    ASSESSMENT & PLAN:  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 Oncotype DX recurrence score 16; 10% risk of recurrence, low risk did not need chemotherapy status post radiation therapy completed January 2016, started antiestrogen therapy with Arimidex 1 mg daily 09/07/2014 Switched to letrozole November 2016.  letrozole toxicities: Patient has chronic muscle stiffness and aches. These symptoms are much better on letrozole then on anastrozole.  Seizures/CVA: Hospitalization October 2017 Myalgias and arthralgias:  tried tonic water, Related to antiestrogen therapy  Breast Cancer Surveillance: 1. Breast exam08/03/2016: Left breast  nipple retraction and tenderness along the surgical scar with nodularity along the scar but beyond that there are no lumps or nodules in the breasts. 2. Mammogram12/15/2017 No abnormalities. Postsurgical changes. Breast density category C. I recommended that she get 3-D mammograms for surveillance.  Return to clinic in 1 year for follow-up  I spent 25 minutes talking to the patient of which more than half was spent in counseling and coordination of care.  No orders of the defined types were placed in this encounter.  The patient has a good understanding of the overall plan. she agrees with it. she will call with any problems that may develop before the next visit here.   Rulon Eisenmenger, MD 12/22/16

## 2017-01-29 ENCOUNTER — Other Ambulatory Visit: Payer: Self-pay | Admitting: Family Medicine

## 2017-01-29 DIAGNOSIS — G8320 Monoplegia of upper limb affecting unspecified side: Secondary | ICD-10-CM

## 2017-01-30 ENCOUNTER — Other Ambulatory Visit: Payer: Self-pay | Admitting: Family Medicine

## 2017-02-01 ENCOUNTER — Other Ambulatory Visit: Payer: Self-pay

## 2017-02-01 DIAGNOSIS — R569 Unspecified convulsions: Secondary | ICD-10-CM

## 2017-02-01 MED ORDER — OXYCODONE-ACETAMINOPHEN 10-325 MG PO TABS: 1.0000 | ORAL_TABLET | Freq: Three times a day (TID) | ORAL | 0 refills | Status: DC | PRN

## 2017-02-01 MED ORDER — OXCARBAZEPINE 300 MG PO TABS: 300.0000 mg | ORAL_TABLET | Freq: Two times a day (BID) | ORAL | 3 refills | Status: DC

## 2017-02-01 NOTE — Telephone Encounter (Signed)
First rx printed for #270 (3 month supply).  Dr. Raliegh Ip did not realize the pt is receiving so many at one time.  Re printed for #90 per Dr. Aaron Mose (husband) notified to pick up at the front desk.

## 2017-02-01 NOTE — Telephone Encounter (Signed)
Ok per Dr. Sherren Mocha

## 2017-02-01 NOTE — Telephone Encounter (Signed)
Called to the pharmacy and left on machine.  Ok per Dr. Sherren Mocha.

## 2017-02-02 ENCOUNTER — Other Ambulatory Visit: Payer: Self-pay

## 2017-02-02 ENCOUNTER — Telehealth: Payer: Self-pay | Admitting: Neurology

## 2017-02-02 DIAGNOSIS — R569 Unspecified convulsions: Secondary | ICD-10-CM

## 2017-02-02 MED ORDER — OXCARBAZEPINE 300 MG PO TABS: 300.0000 mg | ORAL_TABLET | Freq: Two times a day (BID) | ORAL | 3 refills | Status: DC

## 2017-02-02 NOTE — Telephone Encounter (Signed)
Pt's husband said Oxcarbazepine (TRILEPTAL) 300 MG tablet should have been sent to [facility]. Please resend

## 2017-02-02 NOTE — Telephone Encounter (Signed)
Rn call patients husband [name] about pt having 6 pharmacies listed. Pts husband stated they only [facility] [address] and [facility] certain drugs. Rn deleted [facility] and [facility] on patients profile. Rn [facility]

## 2017-03-10 ENCOUNTER — Other Ambulatory Visit: Payer: Self-pay | Admitting: Internal Medicine

## 2017-03-15 ENCOUNTER — Other Ambulatory Visit: Payer: Self-pay | Admitting: Internal Medicine

## 2017-03-15 ENCOUNTER — Telehealth: Payer: Self-pay | Admitting: Family Medicine

## 2017-03-15 NOTE — Telephone Encounter (Signed)
Pt need new Rx for Oxycodone   Pt is aware of 3 business days for refills and someone will call when ready for pick up. °

## 2017-03-16 ENCOUNTER — Other Ambulatory Visit: Payer: Self-pay

## 2017-03-16 MED ORDER — OXYCODONE-ACETAMINOPHEN 10-325 MG PO TABS: 1.0000 | ORAL_TABLET | Freq: Three times a day (TID) | ORAL | 0 refills | Status: DC | PRN

## 2017-03-16 NOTE — Telephone Encounter (Signed)
Okay, #90.  Please have patient follow-up with Dr. Sherren Mocha in September

## 2017-03-16 NOTE — Telephone Encounter (Signed)
This is dr Stacy Diaz patient requesting for a refill on Oxycodone which was last filled 02/01/2017 #90 with 0 refill , pt is current with her OV and last labs was on 06/04/2016. Please advise if ok to refill.

## 2017-03-16 NOTE — Telephone Encounter (Signed)
Rx has been printed waiting for the doctor signature.

## 2017-03-17 NOTE — Telephone Encounter (Signed)
Rx was signed and pt husband is aware to pick up at the front desk, Pt is scheduled for a follow up app with dr Sherren Mocha on 04/27/2017 at 11.30am.

## 2017-03-18 ENCOUNTER — Other Ambulatory Visit: Payer: Self-pay | Admitting: Family Medicine

## 2017-03-18 MED ORDER — OXYCODONE-ACETAMINOPHEN 10-325 MG PO TABS: 1.0000 | ORAL_TABLET | Freq: Three times a day (TID) | ORAL | 0 refills | Status: DC | PRN

## 2017-04-20 ENCOUNTER — Other Ambulatory Visit: Payer: Self-pay | Admitting: Family Medicine

## 2017-04-20 MED ORDER — OXYCODONE-ACETAMINOPHEN 10-325 MG PO TABS: 1.0000 | ORAL_TABLET | Freq: Three times a day (TID) | ORAL | 0 refills | Status: DC | PRN

## 2017-04-20 NOTE — Telephone Encounter (Signed)
I called and spoke with pt's husband, [name] (he is on [place]). I advised rx ready for pick up. He voiced understanding and states he will pick up tomorrow.

## 2017-04-20 NOTE — Telephone Encounter (Signed)
Pt needs new rx oxycodone °

## 2017-04-20 NOTE — Telephone Encounter (Signed)
Last filled 03/18/17 by Dr Burnice Logan. Pt has appt with Dr. Sherren Mocha 04/27/17 and Dr. Leonie Man (@ [place]) 04/28/17. Please advise if you would like to give new rx.

## 2017-04-26 ENCOUNTER — Other Ambulatory Visit: Payer: Self-pay | Admitting: Family Medicine

## 2017-04-26 DIAGNOSIS — G8320 Monoplegia of upper limb affecting unspecified side: Secondary | ICD-10-CM

## 2017-04-27 ENCOUNTER — Ambulatory Visit (INDEPENDENT_AMBULATORY_CARE_PROVIDER_SITE_OTHER): Payer: Medicare Other | Admitting: Family Medicine

## 2017-04-27 ENCOUNTER — Encounter: Payer: Self-pay | Admitting: Family Medicine

## 2017-04-27 VITALS — BP 148/84 | HR 58 | Temp 98.4°F | Wt 145.0 lb

## 2017-04-27 DIAGNOSIS — C50512 Malignant neoplasm of lower-outer quadrant of left female breast: Secondary | ICD-10-CM | POA: Diagnosis not present

## 2017-04-27 DIAGNOSIS — Z23 Encounter for immunization: Secondary | ICD-10-CM | POA: Diagnosis not present

## 2017-04-27 DIAGNOSIS — I10 Essential (primary) hypertension: Secondary | ICD-10-CM

## 2017-04-27 DIAGNOSIS — Z170 Estrogen receptor positive status [ER+]: Secondary | ICD-10-CM

## 2017-04-27 DIAGNOSIS — G40209 Localization-related (focal) (partial) symptomatic epilepsy and epileptic syndromes with complex partial seizures, not intractable, without status epilepticus: Secondary | ICD-10-CM | POA: Diagnosis not present

## 2017-04-27 MED ORDER — DIAZEPAM 5 MG PO TABS: 5.0000 mg | ORAL_TABLET | Freq: Every day | ORAL | 4 refills | Status: DC

## 2017-04-27 MED ORDER — BACLOFEN 20 MG PO TABS: ORAL_TABLET | ORAL | 4 refills | Status: DC

## 2017-04-27 MED ORDER — ATENOLOL 50 MG PO TABS: ORAL_TABLET | ORAL | 4 refills | Status: DC

## 2017-04-27 MED ORDER — ATORVASTATIN CALCIUM 10 MG PO TABS: 10.0000 mg | ORAL_TABLET | Freq: Every day | ORAL | 4 refills | Status: DC

## 2017-04-27 MED ORDER — CLOPIDOGREL BISULFATE 75 MG PO TABS: 75.0000 mg | ORAL_TABLET | Freq: Every day | ORAL | 4 refills | Status: DC

## 2017-04-27 MED ORDER — OXYCODONE-ACETAMINOPHEN 10-325 MG PO TABS: 1.0000 | ORAL_TABLET | Freq: Three times a day (TID) | ORAL | 0 refills | Status: DC | PRN

## 2017-04-27 NOTE — Progress Notes (Signed)
Stacy Diaz is a 66 year old married female nonsmoker who comes in today for evaluation of multiple issues. Her primary problem is chronic pain from previous complications from neurosurgery many years ago for a brain tumor. She takes Percocet 10-3 25 3  tablets daily. She also needs refills on other medications. Data home health care aide however the home health. With stealing her Percocet Valium and Ativan. They called the police but the please could do nothing about it.  In the interim she ran out of Ativan has not taken it for at least 6 weeks and wishes to stay off of it.  Seasonal flu shot today.vs BP (!) 148/84 (BP Location: Right Arm, Patient Position: Sitting, Cuff Size: Normal)   Pulse (!) 58   Temp 98.4 F (36.9 C) (Oral)   Wt 145 lb (65.8 kg)   BMI 25.69 kg/m  General she is well-developed well-nourished female confined to a wheelchair as noticed above.  #1 chronic pain secondary to complications from surgery for brain tumor.......... continue Percocet  Also refill all other medications

## 2017-04-27 NOTE — Patient Instructions (Signed)
Continue current medications as outlined  Return when necessary  Contact your insurance company to find it wearing get the shingles vaccine the cheapest

## 2017-04-28 ENCOUNTER — Encounter: Payer: Self-pay | Admitting: Neurology

## 2017-04-28 ENCOUNTER — Ambulatory Visit (INDEPENDENT_AMBULATORY_CARE_PROVIDER_SITE_OTHER): Payer: Medicare Other | Admitting: Neurology

## 2017-04-28 VITALS — BP 155/86 | HR 65

## 2017-04-28 DIAGNOSIS — I6990 Unspecified sequelae of unspecified cerebrovascular disease: Secondary | ICD-10-CM | POA: Diagnosis not present

## 2017-04-28 NOTE — Progress Notes (Signed)
PATIENT: Stacy Diaz DOB: 09-24-1950  REASON FOR VISIT: routine follow up for stroke, partial seizures HISTORY FROM: patient  HISTORY OF PRESENT ILLNESS: [name] is a 66 year old female with history of previous meningioma resection with subsequent spastic paraparesis who developed left arm weakness and facial droop on the morning of 08/25/2012. She had multiple episodes in the past similar to the episode above but this one had been persistent. MRI was not an option since she had history of no clips to 2 meningioma resection. Since her surgery she has had trouble with weakness from waist down. At her baseline she had trouble angulating for the past 4 years but could bear weight on her legs but needed help with transfers. Lately her legs have been nonweight bearing at all.  She was admitted to [facility] for workup and CT was negative for acute infarct. She was discharged to [facility] after 3 days. On the third day there her symptoms became worse and she was readmitted to the hospital. On the second day of her second hospitalization she had a seizure that was witnessed and included loss of consciousness with a blank stare and lasted approximately 45 minutes. She suffered another seizure that was shorter in length approximately 10 minutes on the following day. She has not had any known recurrent seizures since that time. She was discharged on Keppra 750 mg twice a day and continues with that dose without any side effects.  She was discharged on Plavix 75 mg daily and continues with that dose. Patient denies medication side effects, with no signs of bleeding. Patient is in outpatient therapy.   Update 07/11/13 (PS): She is seen for her first office followup visit following hospital admission on 05/31/39 and with sudden onset of left facial droop and left upper weakness and numbness. She started improving by the time EMS came and she was back to nearly baseline within 1 hour. CT scan of  the head showed no acute abnormality. MRI could not be done because she has a metal surgical plate following meningioma resection surgery 10 years ago. She had EEG which was unremarkable. She was previously on Keppra 750 twice daily the dose of which was increased to 1 g twice daily. She states she has finished outpatient physical and occupational therapy and is almost 90% back to her baseline. She has spastic paraplegia following meningioma surgery 10 years ago and her similar episode left face and arm weakness possibly right brain stroke in January 2014. She is tolerating increase to Keppra but does complain of mild tiredness which seemed to be improving but is not back to her baseline. She is also tolerating Plavix without bleeding, bruising or other side effects. She states her blood pressure is well controlled, slightly elevated at 139/76 today. She had EEG done on 01/05/13 which showed no evidence of seizure activity.   UPDATE 10/18/13 (LL): She returns to the office for follow up, she reports she is doing well, back to her baseline. Gets very tired by the end of the day and due to core muscle weakness she is often leaning to the right in her wheelchair by the late afternoon. She has a new aide at home who is starting to help her do crunches and resistance band exercises. She is tolerating Keppra well with no recurrent partial seizures. Continues on Plavix, patient denies medication side effects, with no signs of bleeding.   UPDATE 04/27/14 (LL): She returns for stroke followup, has been well except  recently had a spell where she felt strange, called for her husband and he found her slumped in her chair and "out of it." They do not think she lost consciousness, and episode lasted less than 10 minutes and she was back to normal. She states that when she is hungry, she sometimes starts to feel poorly, anxious, and shaky and wonders if her blood sugar is too low. The feeling disappears when she eats. They do not  have a blood glucose meter. She has been compliant with taking her Keppra and all other medications. Her blood pressure is well controlled, it is 134/80 in the office today. She is tolerating Plavix well with no signs of significant bleeding or bruising. Update 10/31/2014 : She returns for follow-up after last visit 6 months ago accompanied by husband. She had 2 episodes of transient left upper extremity numbness lasting 10 minutes 3 weeks apart in August 2015. She also had an episode of seizure in September 2015 when she must have violent 3 hit the joystick off her electric wheelchair. At that time Keppra was changed to Vimpat which she takes in a dose of 50 mg twice daily. She does complain of tired numbness but this may be related to her recent diagnosis of breast cancer for which she has undergone lumpectomy and has received 4 weeks of radiation. She is currently on antiestrogen pill. She states her blood pressure has been good. Update 05/14/2015 : She returns for follow-up after last visit 6 months ago. She is doing well without recurrent TIA symptoms or seizures. She is tolerating Vimpat 100 mg twice daily without any side effects. She remains on baclofen 20 mg 4 times daily which seems to work well for her spasticity and when it wears off she notices a difference. She has been started on Arimidex by her cancer doctor and feels that this may have increased stiffness. She plans to discuss this with her cancer doctor at the next visit. She remains on Plavix which is tolerating well without any bleeding episodes but does have minor bruising. She is also on Lipitor without muscle aches or pains. She is due for follow-up lipid profile in 2 months at next visit with primary doctor. She remains wheelchair bound since her meningioma surgery and can help with transfers and can  bear weight. but cannot walk Update 04/17/2016 :  She returns for follow-up after last visit a year ago. She is accompanied by husband. Patient  states she could not afford the Vimpat and hence was switched to Trileptal 300 mg twice daily. She complains of little bit of drowsiness still with that but it's getting better. She continues to have significant spasticity in stiffness of her legs. She has trouble bending her legs and putting it on the foot rest of her electric wheelchair. She currently takes baclofen 20 mg 4 times daily and can take To hospital if needed. She also takes Robaxin 503 times daily. She however tends to take all the pills together at the same time. I advised her to try to stagger the Robaxin and the baclofen that she is not as sleepy. Patient has some occasional bad days and she feels very weak and tired and can barely get up or concentrate. Update 10/01/2016 : She returns for follow-up after recent hospital admission for TIA in October 2017. She presented with sudden onset of decreased sensation in the left face and arm but mild weakness. Symptoms improved quickly after arrival to the hospital. CT scan of the head  showed no acute abnormality and  MRI scan could not be obtain due to old surgical clips at the site of prior meningioma surgery which are not MRI compatible. Carotid ultrasound showed no extra cranialStenosis. CT Angiogram of the Brain and Neck Showed Only Mild Atherosclerosis. Transthoracic Echo Showed Normal Ejection Fraction. LDL Cholesterol Was 79 Mg Percent. Hemoglobin A1c Was 5.4. Patient Was on Plavix Prior to Admission Which Was Continued. She Was on Trileptal for Seizures and Level Was Checked and It Was Optimal at 11mg %. Patient Was Discharged Home and Has Been Getting Home Physical and Occupation Therapy Which She Has Recently Finished. She States She Had Another Similar Episode in January 2018 When She Had Transient Left Face and Upper Extremity Numbness and Weakness Which Lasted around 10 Minutes. She Felt Left Side of Her Tongue Was Also Numb. She Is Fully Awake and Did Not Lose Consciousness and Fell Feel  Disorientation or Have Any Headache. I Have Discussed with Patient Possibility of Switching Plavix to Aggrenox but after Discussion of Possible Side Effects of Headache She Does Not Want to Change. Patient Remains Paraplegic. She Can Stand and Bear Weight on Her Legs If She Wears Braces. She Needs Some Help with Transferring from a Wheelchair to the Bed by Using a Transfer Desk. She Is Tolerating Plavix without Any Bleeding or Bruising. Update 04/28/2017 : She returns for follow-up after last visit 6 months ago. She is doing well and has not had any recurrent episodes of transient face arm paresthesias, any other types of TIAs, strokes or seizures. She remains on Trileptal 300 twice daily which is tolerating well without any side effects. She states her blood pressure is well controlled at home but today it is elevated at [id number] but she blames it on a hard time she had come into our office through the brain. She is tolerating Lipitor well without muscle aches and pain and last lipid profile checked by primary care physician was satisfactory. She is tolerating Plavix well without significant bruising. No new complaints REVIEW OF SYSTEMS: Full 14 system review of systems performed and notable only for ,   constipation, incontinence of bladder, joint pain, snoring, aching muscles, walking difficulty, neck pain and stiffness, skin moles, seizure, easy bleeding  and all other systems negative  ALLERGIES: No Known Allergies  HOME MEDICATIONS: Outpatient Medications Prior to Visit  Medication Sig Dispense Refill  . atenolol (TENORMIN) 50 MG tablet Pt is to take 1 1/2 (75mg ) tab in the morning and 1 (50mg ) full tab at night 300 tablet 4  . atorvastatin (LIPITOR) 10 MG tablet Take 1 tablet (10 mg total) by mouth daily. 90 tablet 4  . baclofen (LIORESAL) 20 MG tablet TAKE 1 TABLET (20 MG TOTAL) BY MOUTH 4 (FOUR) TIMES DAILY. 360 tablet 4  . clopidogrel (PLAVIX) 75 MG tablet Take 1 tablet (75 mg total) by mouth  daily with breakfast. 90 tablet 4  . diazepam (VALIUM) 5 MG tablet Take 1 tablet (5 mg total) by mouth at bedtime. 90 tablet 4  . letrozole (FEMARA) 2.5 MG tablet Take 1 tablet (2.5 mg total) by mouth daily. 90 tablet 4  . methocarbamol (ROBAXIN) 500 MG tablet take 1 tablet by mouth 3 times daily 270 tablet 4  . Multiple Vitamin (MULTIVITAMIN WITH MINERALS) TABS tablet Take 1 tablet by mouth every morning.     Marland Kitchen OVER THE COUNTER MEDICATION Apply 1 application topically daily as needed (pain). Aspercreme with 5% lidocaine    . Oxcarbazepine (TRILEPTAL) 300  MG tablet Take 1 tablet (300 mg total) by mouth 2 (two) times daily. 180 tablet 3  . oxyCODONE-acetaminophen (PERCOCET) 10-325 MG tablet Take 1 tablet by mouth 3 (three) times daily as needed for pain. 270 tablet 0  . polyethylene glycol (MIRALAX / GLYCOLAX) packet Take 17 g by mouth daily as needed (for constipation). Mix with liquid and drink    . senna (SENOKOT) 8.6 MG TABS tablet Take 1 tablet by mouth.    . zinc oxide (BALMEX) 11.3 % CREA cream Apply 1 application topically at bedtime as needed (IRRITATION).      No facility-administered medications prior to visit.     PHYSICAL EXAM Vitals:   04/28/17 1409  BP: (!) 155/86  Pulse: 65    GENERAL EXAM: Pleasant middle-age Caucasian lady Patient is in no distress, pleasant, wheelchair bound  HEAD: Symmetric facial features.  EARS, NOSE, and THROAT: Normal.  NECK: Supple, no JVD  RESPIRATORY: Lungs CTA.  CARDIOVASCULAR: Regular rate and rhythm, no murmurs, no carotid bruits  SKIN: No rash, no bruising   NEUROLOGIC:  MENTAL STATUS: awake, alert and oriented to person, place and time, language fluent, comprehension intact, naming intact  CRANIAL NERVE: pupils equal and reactive to light, visual fields full to confrontation, mild facial asymmetry on the left, uvula midline, shoulder shrug symmetric, tongue midline.  MOTOR: Significant weakness in right and left lower extremities with  only 1/5 strength hip extension ,2/5 knee extension and 0/5 at the   ankles and toes. Tone is increase in the both lower extremities, with significant spasticity. 4/5 strength left upper extremity, fine finger movements decreased on left  SENSORY: normal and symmetric to light touch . No sensory level on the trunk COORDINATION: finger-nose-finger with mild ataxia on left, normal on right  REFLEXES: deep tendon reflexes BUE present and symmetric 2+, BLE brisk 2= but difficult to elicit  GAIT/STATION: Wheelchair-bound.   ASSESSMENT: Ms. Xian Apostol is a 66 year old female with history of previous meningioma resection with subsequent spastic paraparesis  In 1998 who developed left arm weakness and facial droop on the morning of 08/25/2012. Felt to be stroke although never confirmed on CT. She experienced 2 partial complex seizures while in the hospital and was discharged on Keppra 750 mg twice a day. She has had a recurrent seizure in September 2015 and was changed from [place] to [place] but could not afford it hence switched to trileptal and seems to be doing well on it. 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  PLAN:  I had a long d/w patient   about  her remote TIAs and small vessel disease,seizures risk for recurrent stroke/TIAs, personally independently reviewed imaging studies and stroke evaluation results and answered questions.Continue Plavix  for secondary stroke prevention and maintain strict control of hypertension with blood pressure goal below 130/90, diabetes with hemoglobin A1c goal below 6.5% and lipids with LDL cholesterol goal below 70 mg/dL. I also advised the patient to eat a healthy diet with plenty of whole grains, cereals, fruits and vegetables,   and maintain ideal body weight. Continue Trileptal in the current dose of 300 mg twice  daily. No routine scheduled follow-up appointment with me is necessary but she may be referred back in the future as needed by her primary physician.   Greater than 50% time during this 25 minute visit was spent  on counseling and coordination of care about her TIA, explanation of diagnosis and treatment plan and answering questions  Antony Contras, MD  [facility] [address] [place], [place] [address]       Note: This document was prepared with digital dictation and possible smart phrase technology. Any transcriptional errors that result from this process are unintentional.

## 2017-04-28 NOTE — Patient Instructions (Signed)
I had a long d/w patient   about  her remote TIAs and small vessel disease,seizures risk for recurrent stroke/TIAs, personally independently reviewed imaging studies and stroke evaluation results and answered questions.Continue Plavix  for secondary stroke prevention and maintain strict control of hypertension with blood pressure goal below 130/90, diabetes with hemoglobin A1c goal below 6.5% and lipids with LDL cholesterol goal below 70 mg/dL. I also advised the patient to eat a healthy diet with plenty of whole grains, cereals, fruits and vegetables,   and maintain ideal body weight. Continue Trileptal in the current dose of 300 mg twice daily. No routine scheduled follow-up appointment with me is necessary but she may be referred back in the future as needed by her primary physician.

## 2017-04-29 ENCOUNTER — Telehealth: Payer: Self-pay | Admitting: Family Medicine

## 2017-04-29 NOTE — Telephone Encounter (Signed)
Pts husband is calling stating that 500 MG methocarbamol (ROBAXIN) is out of stock and would like to see if they could 750 MG of the same medication for bid instead.  Pharm:  [facility]

## 2017-05-05 NOTE — Telephone Encounter (Signed)
Pt is calling again wanting to see if Dr. Sherren Diaz will approve her to get 750 MG and take 2 a day the 500 MG is out of stock due to manufacture being out.

## 2017-05-06 ENCOUNTER — Telehealth: Payer: Self-pay

## 2017-05-06 ENCOUNTER — Other Ambulatory Visit: Payer: Self-pay

## 2017-05-06 MED ORDER — METHOCARBAMOL 750 MG PO TABS: 750.0000 mg | ORAL_TABLET | Freq: Two times a day (BID) | ORAL | Status: DC

## 2017-05-06 MED ORDER — METHOCARBAMOL 500 MG PO TABS: 750.0000 mg | ORAL_TABLET | Freq: Two times a day (BID) | ORAL | 0 refills | Status: DC

## 2017-05-06 NOTE — Telephone Encounter (Signed)
Called pt back to get more information in regards to the wife medication which husband states its been out of market.Pt was verbally rude,yelled out at me so loud stated that our office sucks and hang up on me. I tried apologize for the delay and miscommunication but pt would not hear me out.

## 2017-05-06 NOTE — Telephone Encounter (Signed)
Rx for Methocarbamol 750 mg was e scribed to pt pharmacy, Rx was okeyed by Dr Sherren Mocha.

## 2017-05-06 NOTE — Telephone Encounter (Signed)
Husband is upset that this refill has not been addressed. Pt aware Dr Sherren Mocha is out until Monday. But pt is out of this med and the pharmacy does not have the 500 mg because it is backordered.  Please advise. Thanks  [facility] # [place], [place]

## 2017-05-06 NOTE — Telephone Encounter (Signed)
Rx for Methocarbamol 750 mg  Was e scribed to patient pharmacy

## 2017-05-06 NOTE — Telephone Encounter (Signed)
Pts husband is calling to see if someone can call him back in reference to above msg.  [place]

## 2017-05-07 ENCOUNTER — Encounter: Payer: Self-pay | Admitting: Family Medicine

## 2017-05-07 MED ORDER — METHOCARBAMOL 750 MG PO TABS
750.0000 mg | ORAL_TABLET | Freq: Two times a day (BID) | ORAL | 0 refills | Status: DC
Start: 2017-05-07 — End: 2017-06-16

## 2017-05-07 NOTE — Addendum Note (Signed)
Addended by: Wynn Banker H on: 05/07/2017 11:21 AM   Modules accepted: Orders

## 2017-05-07 NOTE — Telephone Encounter (Signed)
Pt new pharm is [facility] on [address]. Please call med into [facility]

## 2017-05-07 NOTE — Telephone Encounter (Signed)
Pharmacy updated and rx resent. Nothing further needed at this time.

## 2017-05-20 ENCOUNTER — Other Ambulatory Visit: Payer: Self-pay | Admitting: Family Medicine

## 2017-05-21 NOTE — Telephone Encounter (Signed)
Called pt pharmacy verified that Rx for Atorvastatin refills was received on  05/03/2017 for 100 tablets with 4 refills. Pharmacy stated that the request sent was by error

## 2017-05-30 ENCOUNTER — Other Ambulatory Visit: Payer: Self-pay | Admitting: Family Medicine

## 2017-05-30 DIAGNOSIS — R569 Unspecified convulsions: Secondary | ICD-10-CM | POA: Diagnosis not present

## 2017-05-30 DIAGNOSIS — Z79899 Other long term (current) drug therapy: Secondary | ICD-10-CM | POA: Diagnosis not present

## 2017-05-30 DIAGNOSIS — Z1379 Encounter for other screening for genetic and chromosomal anomalies: Secondary | ICD-10-CM | POA: Diagnosis not present

## 2017-05-30 DIAGNOSIS — I82409 Acute embolism and thrombosis of unspecified deep veins of unspecified lower extremity: Secondary | ICD-10-CM | POA: Diagnosis not present

## 2017-05-30 DIAGNOSIS — I209 Angina pectoris, unspecified: Secondary | ICD-10-CM | POA: Diagnosis not present

## 2017-05-30 DIAGNOSIS — F330 Major depressive disorder, recurrent, mild: Secondary | ICD-10-CM | POA: Diagnosis not present

## 2017-05-30 DIAGNOSIS — I25118 Atherosclerotic heart disease of native coronary artery with other forms of angina pectoris: Secondary | ICD-10-CM | POA: Diagnosis not present

## 2017-05-31 DIAGNOSIS — Z800 Family history of malignant neoplasm of digestive organs: Secondary | ICD-10-CM | POA: Diagnosis not present

## 2017-05-31 DIAGNOSIS — R102 Pelvic and perineal pain: Secondary | ICD-10-CM | POA: Diagnosis not present

## 2017-05-31 DIAGNOSIS — R5383 Other fatigue: Secondary | ICD-10-CM | POA: Diagnosis not present

## 2017-05-31 DIAGNOSIS — R112 Nausea with vomiting, unspecified: Secondary | ICD-10-CM | POA: Diagnosis not present

## 2017-05-31 DIAGNOSIS — R3915 Urgency of urination: Secondary | ICD-10-CM | POA: Diagnosis not present

## 2017-05-31 DIAGNOSIS — Z803 Family history of malignant neoplasm of breast: Secondary | ICD-10-CM | POA: Diagnosis not present

## 2017-05-31 DIAGNOSIS — N6459 Other signs and symptoms in breast: Secondary | ICD-10-CM | POA: Diagnosis not present

## 2017-05-31 DIAGNOSIS — Z8041 Family history of malignant neoplasm of ovary: Secondary | ICD-10-CM | POA: Diagnosis not present

## 2017-05-31 DIAGNOSIS — R109 Unspecified abdominal pain: Secondary | ICD-10-CM | POA: Diagnosis not present

## 2017-06-10 ENCOUNTER — Other Ambulatory Visit: Payer: Self-pay | Admitting: Family Medicine

## 2017-06-16 ENCOUNTER — Telehealth: Payer: Self-pay | Admitting: Family Medicine

## 2017-06-16 ENCOUNTER — Other Ambulatory Visit: Payer: Self-pay

## 2017-06-16 MED ORDER — METHOCARBAMOL 750 MG PO TABS: 750.0000 mg | ORAL_TABLET | Freq: Two times a day (BID) | ORAL | 0 refills | Status: DC

## 2017-06-16 NOTE — Telephone Encounter (Signed)
Rx for Robaxin 750 mg was sent to pt pharmacy.

## 2017-06-16 NOTE — Telephone Encounter (Signed)
Patinet's husband called and stated that she needs the following medication refilled. Requests a 90 day supply to be sent to the [facility]   methocarbamol (ROBAXIN) 750 MG tablet

## 2017-07-28 ENCOUNTER — Telehealth: Payer: Self-pay | Admitting: Family Medicine

## 2017-07-28 ENCOUNTER — Other Ambulatory Visit: Payer: Self-pay | Admitting: Family Medicine

## 2017-07-28 ENCOUNTER — Other Ambulatory Visit: Payer: Self-pay

## 2017-07-28 MED ORDER — METHOCARBAMOL 750 MG PO TABS: 750.0000 mg | ORAL_TABLET | Freq: Two times a day (BID) | ORAL | 0 refills | Status: DC

## 2017-07-28 MED ORDER — OXYCODONE-ACETAMINOPHEN 10-325 MG PO TABS: 1.0000 | ORAL_TABLET | Freq: Three times a day (TID) | ORAL | 0 refills | Status: DC | PRN

## 2017-07-28 NOTE — Telephone Encounter (Signed)
Rx for Robaxin has been send  To [facility] per pt request . Rx for Percocet has been printed, signed and pt is aware to pick up Rx in the office.

## 2017-07-28 NOTE — Telephone Encounter (Signed)
Rx has been printed waiting for a signature from the doctor.

## 2017-07-28 NOTE — Telephone Encounter (Signed)
Rx has been signed and is waiting at the front desk.

## 2017-07-28 NOTE — Telephone Encounter (Signed)
Rx is waiting for pick up at the front desk, left a message on pt voice mail.

## 2017-07-28 NOTE — Telephone Encounter (Signed)
Copied from [place] [phone]. Topic: Quick Communication - Rx Refill/Question >> Jul 28, 2017  9:20 AM Carolyn Stare wrote:  RX  oxyCODONE-acetaminophen (PERCOCET) 10-325 MG tablet   Pick up script in the office

## 2017-08-05 DIAGNOSIS — Z853 Personal history of malignant neoplasm of breast: Secondary | ICD-10-CM | POA: Diagnosis not present

## 2017-08-05 DIAGNOSIS — R922 Inconclusive mammogram: Secondary | ICD-10-CM | POA: Diagnosis not present

## 2017-08-05 LAB — HM MAMMOGRAPHY

## 2017-09-10 ENCOUNTER — Telehealth: Payer: Self-pay | Admitting: Family Medicine

## 2017-09-10 NOTE — Telephone Encounter (Signed)
Stacy Diaz dropped off a disability placard for the patient.  Call [employer] at [phone] to pick up form  Disposition: Dr's folder

## 2017-09-13 NOTE — Telephone Encounter (Signed)
Forms have been received waiting for dr Sherren Mocha to complete and sign.

## 2017-09-13 NOTE — Telephone Encounter (Signed)
Called pt aware that the Parking Placard has been completed and is readt for pick up at the front desk.

## 2017-09-16 NOTE — Telephone Encounter (Signed)
Patient's husband Merica Prell picked up form at time of visit

## 2017-10-14 IMAGING — CT CT HEAD CODE STROKE
3 of 4 series · 17 of 47 positions shown, 20 images · non-contrast
Comparison: 05/30/2013

CLINICAL DATA: Code stroke. Right-sided weakness, slurred speech,
and confusion.

EXAM:
CT HEAD WITHOUT CONTRAST
TECHNIQUE: Contiguous axial images were obtained from the base of the skull
through the vertex without intravenous contrast.

[Series 201: head w/o, idose (1) · axial · non-contrast · 0.40mm/px · z∈[+164,+289]mm · 11 of 31 slices shown, 14 images]
[im 3/31  brain]
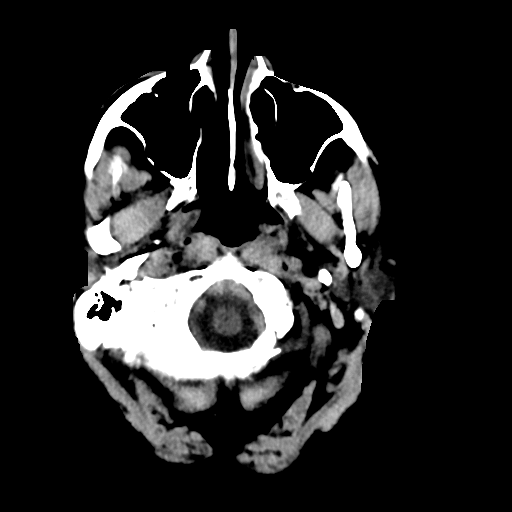
[im 3/31  bone]
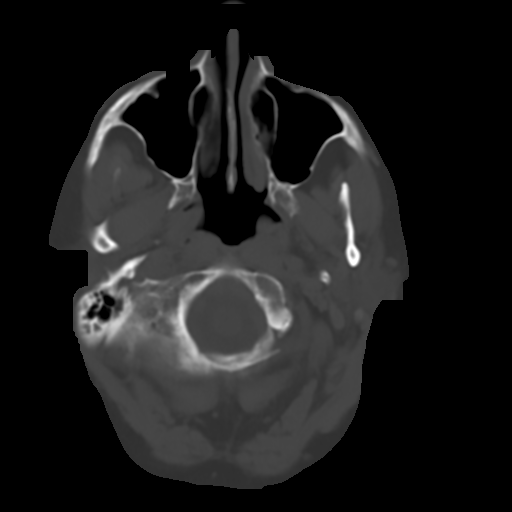
[im 5/31  brain]
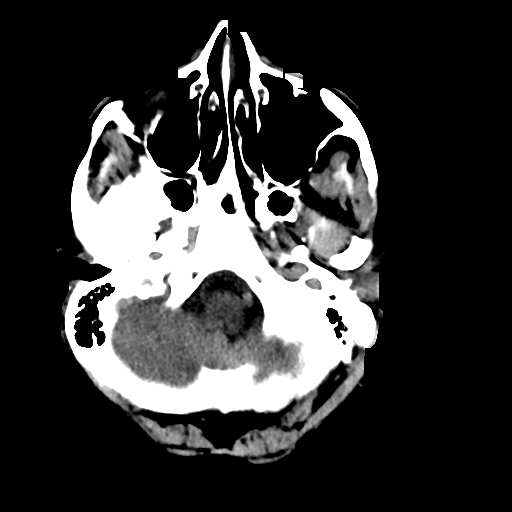
[im 7/31  brain]
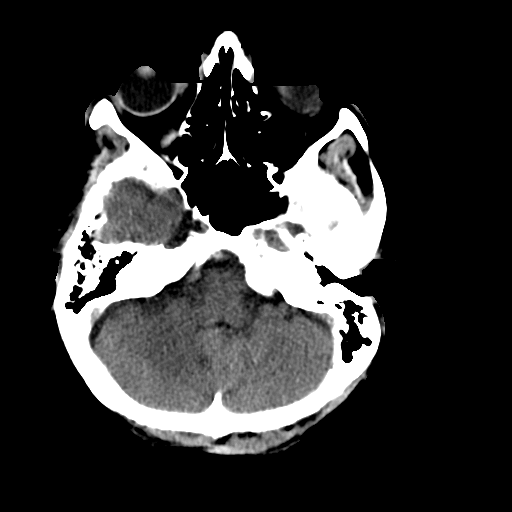
[im 11/31  brain]
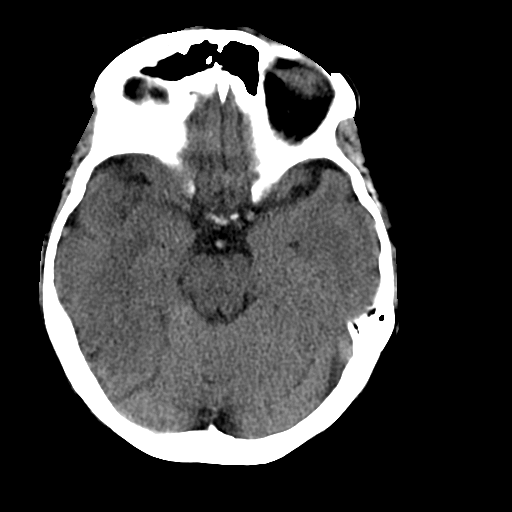
[im 13/31  brain]
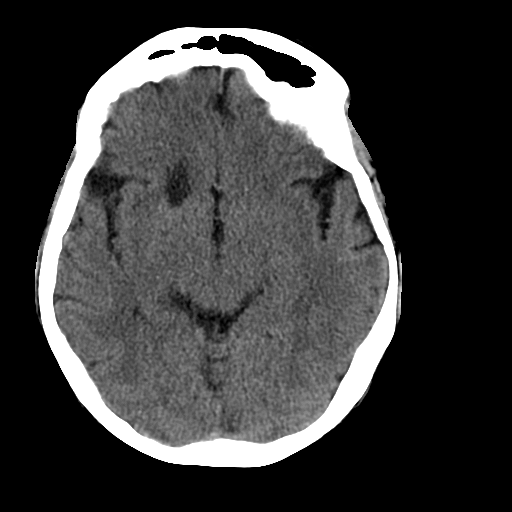
[im 13/31  bone]
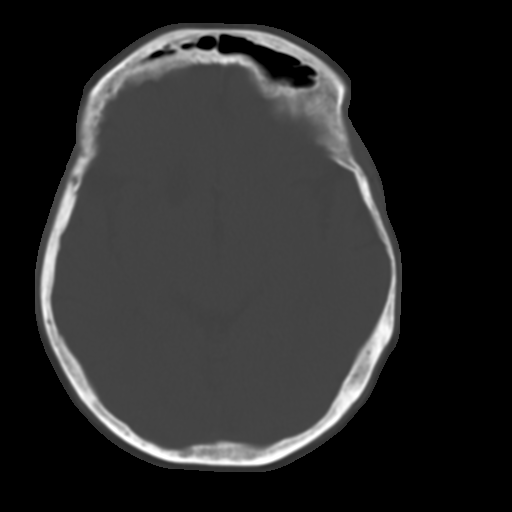
[im 16/31  brain]
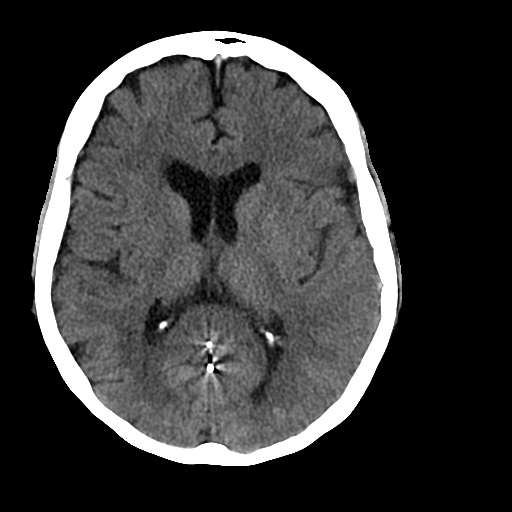
[im 18/31  brain]
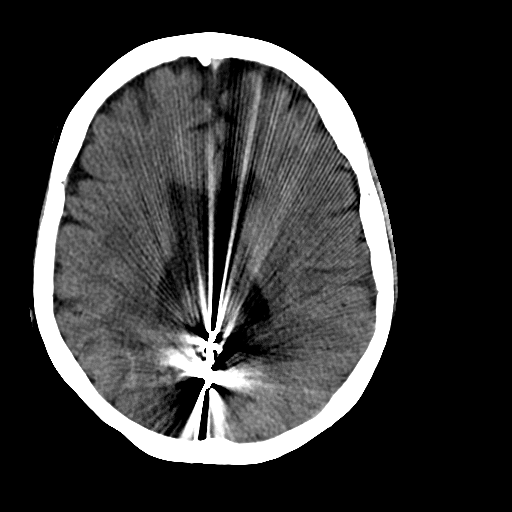
[im 20/31  brain]
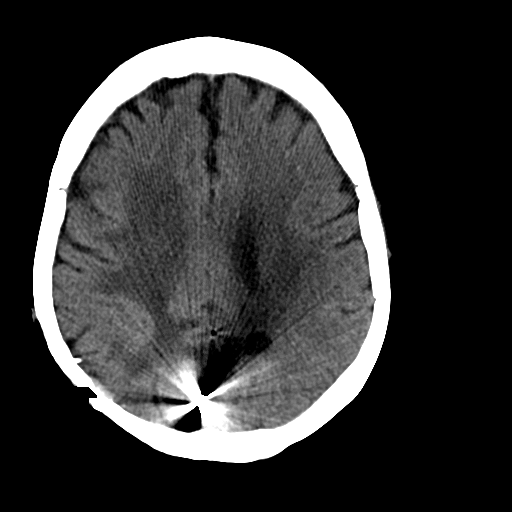
[im 24/31  brain]
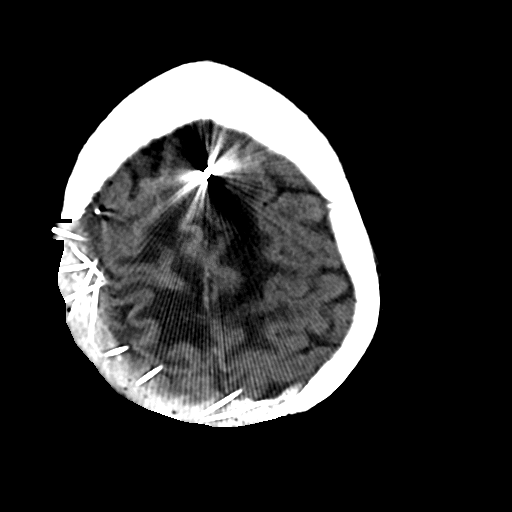
[im 24/31  bone]
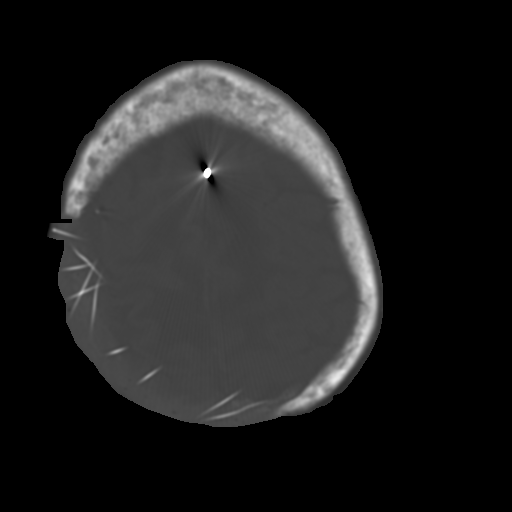
[im 26/31  brain]
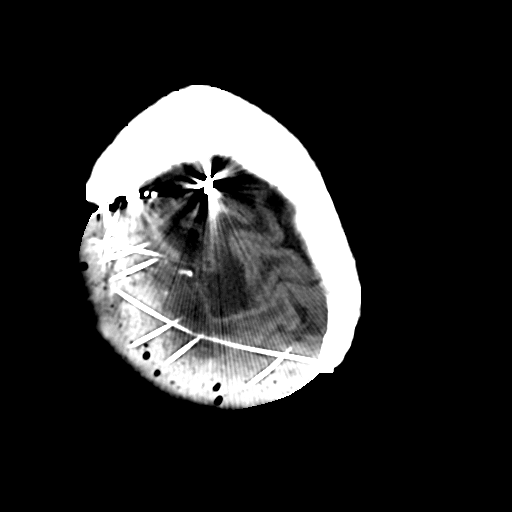
[im 28/31  brain]
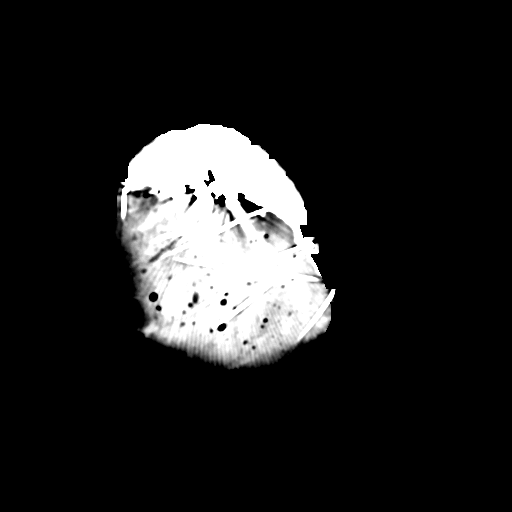

[Series 203: coronal st, idose (1) · coronal · 0.39mm/px · 3 of 67 slices shown]
[im 23/67  brain]
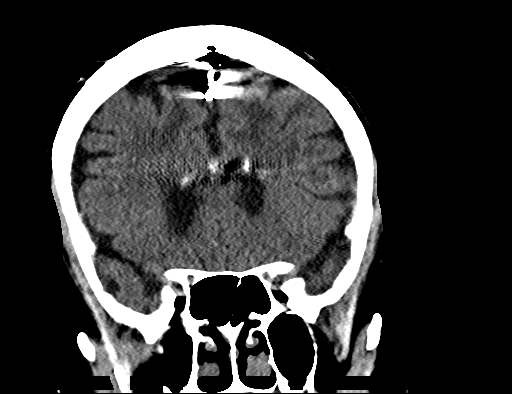
[im 30/67  brain]
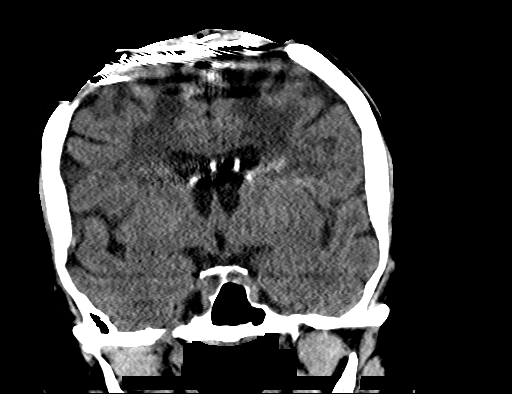
[im 37/67  brain]
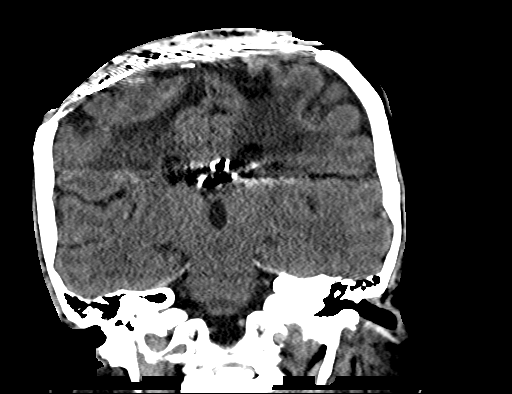

[Series 204: sagittal st, idose (1) · sagittal · 0.39mm/px · 3 of 65 slices shown]
[im 22/65  brain]
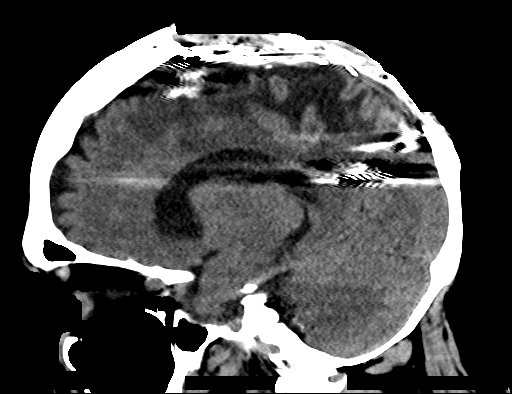
[im 33/65  brain]
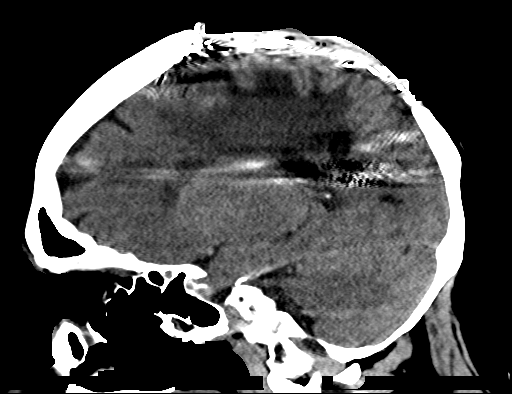
[im 43/65  brain]
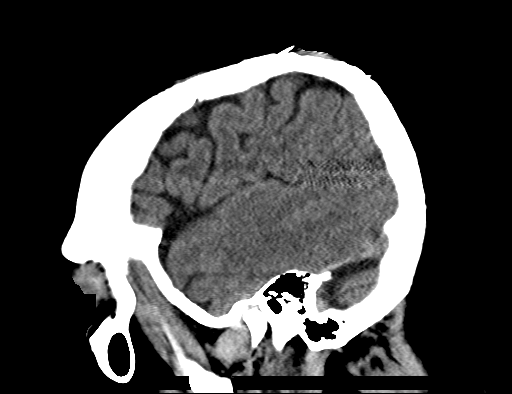

[17 of 47 positions shown; findings below may reference images not displayed]

FINDINGS: Brain: Chronic craniectomy changes involving the posterior skull and
vertex with wires and clips which extend into the interhemispheric
fissure and result in streak artifact, mildly limiting assessment of
this region. Chronic encephalomalacia and gliosis in the superior
aspects of both cerebral hemispheres are similar to the prior study.
A small chronic infarct involving the anterior right basal ganglia/
white matter adjacent to the frontal horn is unchanged. There is no
definite evidence of acute cortical infarct, intracranial
hemorrhage, mass, midline shift, or extra-axial fluid collection.

Vascular: No hyperdense vessel or unexpected calcification.

Skull: Craniectomy changes and unchanged diffuse skull heterogeneity
predominantly in the frontal region.

Sinuses/Orbits: Unremarkable orbits. Visualized paranasal sinuses
and mastoid air cells are clear.

Other: None.

ASPECTS (Alberta Stroke Program Early CT Score)

- Ganglionic level infarction (caudate, lentiform nuclei, internal
capsule, insula, M1-M3 cortex): 7

- Supraganglionic infarction (M4-M6 cortex): 3

Total score (0-10 with 10 being normal): 10
IMPRESSION: 1. No evidence of acute intracranial abnormality.
2. ASPECTS is 10.
3. Chronic changes from prior meningioma resection.
4. Chronic right anterior basal ganglia infarct.
These results were called by telephone at the time of interpretation
on 06/02/2016 at [DATE] to Dr. Sonny, who verbally acknowledged
these results.

## 2017-10-26 ENCOUNTER — Other Ambulatory Visit: Payer: Self-pay | Admitting: Family Medicine

## 2017-10-26 MED ORDER — METHOCARBAMOL 750 MG PO TABS: 750.0000 mg | ORAL_TABLET | Freq: Two times a day (BID) | ORAL | 4 refills | Status: DC

## 2017-10-26 NOTE — Telephone Encounter (Signed)
Copied from [place] [phone]. Topic: Quick Communication - See Telephone Encounter >> Oct 26, 2017  9:24 AM Ahmed Prima L wrote: CRM for notification. See Telephone encounter for:   10/26/17.  Patient is requesting diazepam (VALIUM) 5 MG tablet, already contacted pharmacy.  [id number] [address], [place]

## 2017-10-26 NOTE — Telephone Encounter (Signed)
Request for Valium refill Last office visit 04/27/17 PCP: Dr. Sherren Mocha Pharmacy: [facility] [place]

## 2017-10-27 ENCOUNTER — Encounter: Payer: Self-pay | Admitting: Family Medicine

## 2017-10-27 MED ORDER — DIAZEPAM 5 MG PO TABS: 5.0000 mg | ORAL_TABLET | Freq: Every day | ORAL | 4 refills | Status: DC

## 2017-10-27 NOTE — Telephone Encounter (Signed)
Copied from [place] [phone]. Topic: Quick Communication - See Telephone Encounter >> Oct 26, 2017  9:24 AM Ahmed Prima L wrote: CRM for notification. See Telephone encounter for:   10/26/17.  Patient is requesting diazepam (VALIUM) 5 MG tablet, already contacted pharmacy.  [id number] [place] >> Oct 26, 2017  4:54 PM Percell Belt A wrote: Pt husband called again bout his med >> Oct 27, 2017  9:52 AM Cleaster Corin, NT wrote: Todd from [facility] calling back to check on the refill status for med. diazepam (VALIUM) 5 MG tablet [[id number]]    [id number] [address], [place] [address]  Phone: [phone] Fax: [phone]

## 2017-10-27 NOTE — Telephone Encounter (Signed)
Rx mistakenly faxed to [facility]. Called and canceled rx at [facility] and phoned it to [facility].

## 2017-10-27 NOTE — Telephone Encounter (Signed)
Rx has been printed waiting for Dr Sherren Mocha to sign.

## 2017-10-27 NOTE — Telephone Encounter (Signed)
Please Advise if ok to print Rx or e scribe to pharmacy.

## 2017-11-01 ENCOUNTER — Ambulatory Visit: Payer: Self-pay

## 2017-11-01 NOTE — Telephone Encounter (Signed)
Pt.'s husband reports pt.'s aid checked BP several times and is still getting a high reading. Denies any symptoms. Husband states "Sometimes Dr. Sherren Mocha has [place] increase her Atenolol."  Answer Assessment - Initial Assessment Questions 1. BLOOD PRESSURE: "What is the blood pressure?" "Did you take at least two measurements 5 minutes apart?"     177/95 2. ONSET: "When did you take your blood pressure?"     This morning 3. HOW: "How did you obtain the blood pressure?" (e.g., visiting nurse, automatic home BP monitor)     Home BP monitor 4. HISTORY: "Do you have a history of high blood pressure?"     Yes 5. MEDICATIONS: "Are you taking any medications for blood pressure?" "Have you missed any doses recently?"     No missed doses 6. OTHER SYMPTOMS: "Do you have any symptoms?" (e.g., headache, chest pain, blurred vision, difficulty breathing, weakness)     No 7. PREGNANCY: "Is there any chance you are pregnant?" "When was your last menstrual period?"     No  Protocols used: HIGH BLOOD PRESSURE-A-AH

## 2017-11-01 NOTE — Telephone Encounter (Signed)
Called pt spoke with husband stated that wife blood pressure spiked up this morning for no reason,pt denies having any symptoms but a warm sensation on her face, Spoke with Dr Regis Bill and she recommended that pt takes 11/2 tab of Atenolol tonight and not her regular dose then recheck the blood pressure. Pt was also advised to go to the emergency room if blood pressure does not go down or if the pulse gets below 50. Pt has been scheduled for an office visit with [facility] tomorrow 11/02/2017 at 3 pm per pt husband's request since Dr Sherren Mocha is not in the office.

## 2017-11-01 NOTE — Telephone Encounter (Signed)
Pt  Husband  [name]  Rechecked   The  Patient BP   At  1425  Was   188/98 . She  Remains  Remains  Awake  And  Alert -  The  Only  Symptom  Is  A  Sensation of her face  feeling  Warm. Husband  States  It  Feels  Normal to  Him.  Husband  Is  Updating the   Findings.

## 2017-11-02 ENCOUNTER — Encounter: Payer: Self-pay | Admitting: Adult Health

## 2017-11-02 ENCOUNTER — Ambulatory Visit (INDEPENDENT_AMBULATORY_CARE_PROVIDER_SITE_OTHER): Payer: PPO | Admitting: Adult Health

## 2017-11-02 VITALS — BP 166/88 | HR 72 | Temp 98.4°F

## 2017-11-02 DIAGNOSIS — I10 Essential (primary) hypertension: Secondary | ICD-10-CM

## 2017-11-02 DIAGNOSIS — F419 Anxiety disorder, unspecified: Secondary | ICD-10-CM | POA: Diagnosis not present

## 2017-11-02 MED ORDER — LORAZEPAM 1 MG PO TABS: 1.0000 mg | ORAL_TABLET | Freq: Two times a day (BID) | ORAL | 0 refills | Status: DC | PRN

## 2017-11-02 MED ORDER — LISINOPRIL 10 MG PO TABS: 10.0000 mg | ORAL_TABLET | Freq: Every day | ORAL | 1 refills | Status: DC

## 2017-11-02 NOTE — Progress Notes (Signed)
Subjective:    Patient ID: Stacy Diaz, female    DOB: 07/09/1951, 67 y.o.   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  She is a patient of Dr. Sherren Mocha who I am seeing today for elevated blood pressure readings. Over the last two days her blood pressure has been increasing. She is currently prescribed Atenolol 75 mg in the am and 50 mg in the pm. She reports that her blood pressure readings in the 150-190/90-108's.   She denies any headaches or blurred vision.   She would also like a refill  for ativan   Review of Systems See HPI   Past Medical History:  Diagnosis Date  . Anxiety   . Arthritis    "probably" (08/25/2012)  . Breast cancer of lower-outer quadrant of left female breast ([place]) 05/25/2014  . Headache(784.0)    "not real frequent" (08/25/2012)  . Heart disease   . HTN (hypertension)   . Hyperlipidemia   . Meningioma ([place]) 1999  . Myocardial infarction ([place]) 1987  . 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 S/P radiation therapy 07/30/14-08/30/13   left breast cancer/50Gy  . Seizures ([place])    last seizure 1 month ago  . Spastic paraparesis    S/P meningioma resection 1999/notes 08/25/2012  . Stroke [facility]) 08/25/2012   "that's what they think I've had; sudden LUE weakness" (08/25/2012)    Social History   Socioeconomic History  . Marital status: Married    Spouse name: david  . Number of children: 2  . Years of education: college  . Highest education level: Not on file  Social Needs  . Financial resource strain: Not on file  . Food insecurity -  worry: Not on file  . Food insecurity - inability: Not on file  . Transportation needs - medical: Not on file  . Transportation needs - non-medical: Not on file  Occupational History  . Occupation: disabled  Tobacco Use  . Smoking status: Former Smoker    Packs/day: 2.00    Years: 10.00    Pack years: 20.00    Types: Cigarettes  . Smokeless tobacco: Never Used  . Tobacco comment: 08/25/2012 "quit smoking cigarettes in 1985 when I had my heart attack"  Substance and Sexual Activity  . Alcohol use: No    Alcohol/week: 4.2 oz    Types: 7 Glasses of wine per week    Comment: occasionally  . Drug use: No  . Sexual activity: No  Other Topics Concern  . Not on file  Social History Narrative  . Not on file    Past Surgical History:  Procedure Laterality Date  . BRAIN MENINGIOMA EXCISION  1999  . BREAST LUMPECTOMY WITH NEEDLE LOCALIZATION AND AXILLARY SENTINEL LYMPH NODE BX Left 06/22/2014   Procedure: LEFT WIRE LOCALIZED LUMPECTOPMY AND SENTINEL NODE MAPPING;  Surgeon: Autumn Messing III, MD;  Location: [place];  Service: General;  Laterality: Left;  . CARDIAC CATHETERIZATION  1987  . CORONARY ANGIOPLASTY  1987   denied intervention: "blockage wasn't bad enough"    Family History  Problem Relation Age of Onset  . Breast cancer Mother 33  unilateral  . Heart disease Father   . Breast cancer Paternal Grandmother 30       unilateral    Not on File  Current Outpatient Medications on File Prior to Visit  Medication Sig Dispense Refill  . atenolol (TENORMIN) 50 MG tablet Pt is to take 1 1/2 (75mg ) tab in the morning and 1 (50mg ) full tab at night 300 tablet 4  . atorvastatin (LIPITOR) 10 MG tablet TAKE 1 TABLET BY MOUTH EVERY DAY 90 tablet 4  . baclofen (LIORESAL) 20 MG tablet TAKE 1 TABLET (20 MG TOTAL) BY MOUTH 4 (FOUR) TIMES DAILY. 360 tablet 4  . clopidogrel (PLAVIX) 75 MG tablet TAKE 1 TABLET BY MOUTH EVERY DAY WITH BREAKFAST 90 tablet 2  . diazepam (VALIUM) 5 MG tablet Take 1  tablet (5 mg total) by mouth at bedtime. 90 tablet 4  . letrozole (FEMARA) 2.5 MG tablet Take 1 tablet (2.5 mg total) by mouth daily. 90 tablet 4  . methocarbamol (ROBAXIN) 750 MG tablet Take 1 tablet (750 mg total) by mouth 2 (two) times daily. 180 tablet 4  . Multiple Vitamin (MULTIVITAMIN WITH MINERALS) TABS tablet Take 1 tablet by mouth every morning.     Marland Kitchen OVER THE COUNTER MEDICATION Apply 1 application topically daily as needed (pain). Aspercreme with 5% lidocaine    . Oxcarbazepine (TRILEPTAL) 300 MG tablet Take 1 tablet (300 mg total) by mouth 2 (two) times daily. 180 tablet 3  . oxyCODONE-acetaminophen (PERCOCET) 10-325 MG tablet Take 1 tablet by mouth 3 (three) times daily as needed for pain. 270 tablet 0  . polyethylene glycol (MIRALAX / GLYCOLAX) packet Take 17 g by mouth daily as needed (for constipation). Mix with liquid and drink    . senna (SENOKOT) 8.6 MG TABS tablet Take 1 tablet by mouth.    . zinc oxide (BALMEX) 11.3 % CREA cream Apply 1 application topically at bedtime as needed (IRRITATION).      No current facility-administered medications on file prior to visit.     BP (!) 166/88   Pulse 72   Temp 98.4 F (36.9 C) (Oral)   SpO2 97%       Objective:   Physical Exam  Constitutional: She is oriented to person, place, and time. She appears well-developed and well-nourished. No distress.  Cardiovascular: Normal rate, regular rhythm, normal heart sounds and intact distal pulses. Exam reveals no gallop and no friction rub.  No murmur heard. Pulmonary/Chest: Effort normal and breath sounds normal. No respiratory distress. She has no wheezes. She has no rales. She exhibits no tenderness.  Neurological: She is alert and oriented to person, place, and time.  Skin: Skin is warm and dry. No rash noted. She is not diaphoretic. No erythema. No pallor.  Psychiatric: She has a normal mood and affect. Her behavior is normal. Judgment and thought content normal.  Nursing note and  vitals reviewed.     Assessment & Plan:  1. Essential hypertension - will add lisinopril 10 mg to her regimen.  - Send me mychart message with readings over the next few days.  - lisinopril (PRINIVIL,ZESTRIL) 10 MG tablet; Take 1 tablet (10 mg total) by mouth daily.  Dispense: 30 tablet; Refill: 1  2. Anxiety  - LORazepam (ATIVAN) 1 MG tablet; Take 1 tablet (1 mg total) by mouth 2 (two) times daily as needed for anxiety.  Dispense: 30 tablet; Refill: 0  [name], NP

## 2017-11-02 NOTE — Telephone Encounter (Signed)
Called pt husband this morning to check on how pt blood pressure is doing, husband states that he checked it right after she got up and he reported that it was 141/79. Pt is being seen by [name] this afternoon

## 2017-11-03 NOTE — Telephone Encounter (Signed)
Pt was seen by [facility] yesterday for Elevated blood pressure.

## 2017-11-05 ENCOUNTER — Encounter: Payer: Self-pay | Admitting: Adult Health

## 2017-11-06 ENCOUNTER — Other Ambulatory Visit: Payer: Self-pay | Admitting: Family Medicine

## 2017-11-08 NOTE — Telephone Encounter (Signed)
Spoke with pt husband stated that Dr Sherren Mocha will be sending the Rx to pharmacy in the morning. Pt husband voiced understanding

## 2017-11-09 ENCOUNTER — Other Ambulatory Visit: Payer: Self-pay | Admitting: Family Medicine

## 2017-11-09 MED ORDER — OXYCODONE-ACETAMINOPHEN 10-325 MG PO TABS: 1.0000 | ORAL_TABLET | Freq: Three times a day (TID) | ORAL | 0 refills | Status: DC | PRN

## 2017-11-09 NOTE — Telephone Encounter (Signed)
Rx was E scribed for refill to pt pharmacy. Pt is aware

## 2017-11-09 NOTE — Telephone Encounter (Signed)
Please advise 

## 2017-11-12 ENCOUNTER — Encounter: Payer: Self-pay | Admitting: Adult Health

## 2017-11-15 ENCOUNTER — Encounter: Payer: Self-pay | Admitting: Adult Health

## 2017-11-17 DIAGNOSIS — H2513 Age-related nuclear cataract, bilateral: Secondary | ICD-10-CM | POA: Diagnosis not present

## 2017-11-19 ENCOUNTER — Other Ambulatory Visit: Payer: Self-pay | Admitting: Adult Health

## 2017-11-19 ENCOUNTER — Encounter: Payer: Self-pay | Admitting: Adult Health

## 2017-11-19 DIAGNOSIS — I10 Essential (primary) hypertension: Secondary | ICD-10-CM

## 2017-11-19 MED ORDER — LISINOPRIL 10 MG PO TABS: ORAL_TABLET | ORAL | 0 refills | Status: DC

## 2017-11-24 ENCOUNTER — Other Ambulatory Visit: Payer: Self-pay | Admitting: Adult Health

## 2017-11-24 DIAGNOSIS — I10 Essential (primary) hypertension: Secondary | ICD-10-CM

## 2017-11-29 ENCOUNTER — Encounter: Payer: Self-pay | Admitting: Adult Health

## 2017-12-01 ENCOUNTER — Ambulatory Visit: Payer: Self-pay | Admitting: *Deleted

## 2017-12-01 ENCOUNTER — Encounter: Payer: Self-pay | Admitting: Adult Health

## 2017-12-01 ENCOUNTER — Ambulatory Visit (INDEPENDENT_AMBULATORY_CARE_PROVIDER_SITE_OTHER): Payer: PPO | Admitting: Adult Health

## 2017-12-01 VITALS — BP 180/96 | HR 70 | Temp 99.1°F

## 2017-12-01 DIAGNOSIS — I10 Essential (primary) hypertension: Secondary | ICD-10-CM

## 2017-12-01 LAB — CBC WITH DIFFERENTIAL/PLATELET
BASOS PCT: 0.4 % (ref 0.0–3.0)
Basophils Absolute: 0 10*3/uL (ref 0.0–0.1)
EOS ABS: 0 10*3/uL (ref 0.0–0.7)
Eosinophils Relative: 0.4 % (ref 0.0–5.0)
HCT: 45.4 % (ref 36.0–46.0)
HEMOGLOBIN: 15.8 g/dL — AB (ref 12.0–15.0)
Lymphocytes Relative: 15.4 % (ref 12.0–46.0)
Lymphs Abs: 1.4 10*3/uL (ref 0.7–4.0)
MCHC: 34.7 g/dL (ref 30.0–36.0)
MCV: 89.4 fl (ref 78.0–100.0)
MONO ABS: 0.5 10*3/uL (ref 0.1–1.0)
Monocytes Relative: 5.9 % (ref 3.0–12.0)
Neutro Abs: 7.2 10*3/uL (ref 1.4–7.7)
Neutrophils Relative %: 77.9 % — ABNORMAL HIGH (ref 43.0–77.0)
Platelets: 233 10*3/uL (ref 150.0–400.0)
RBC: 5.08 Mil/uL (ref 3.87–5.11)
RDW: 13.7 % (ref 11.5–15.5)
WBC: 9.2 10*3/uL (ref 4.0–10.5)

## 2017-12-01 LAB — BASIC METABOLIC PANEL
BUN: 11 mg/dL (ref 6–23)
CALCIUM: 9.8 mg/dL (ref 8.4–10.5)
CO2: 25 mEq/L (ref 19–32)
CREATININE: 0.54 mg/dL (ref 0.40–1.20)
Chloride: 96 mEq/L (ref 96–112)
GFR: 119.84 mL/min (ref >60.00)
Glucose, Bld: 91 mg/dL (ref 70–99)
Potassium: 4.2 mEq/L (ref 3.5–5.1)
Sodium: 130 mEq/L — ABNORMAL LOW (ref 135–145)

## 2017-12-01 NOTE — Telephone Encounter (Signed)
Pt's husband called stating that the pt's blood pressure remains elevated; this morning she complained of a headache; at 0800 she took 75 mg atenolol and   Lisinopril 10 mg; at 0945 her blood pressure was 184/101 and 178/102 at 0947; these were taken with an automatic cuff on her right arm; recommendations made per nurse triage to include seeing physician within 24 hours; pt normally ses Dr Sherren Mocha but he has no availability within timeframe per protocol; pt offered and accepted 1400 appointment with [name], [facility]; will route to office for notification of this upcoming appointment.   Reason for Disposition . Systolic BP  >= 185 OR Diastolic >= 631  Answer Assessment - Initial Assessment Questions 1. BLOOD PRESSURE: "What is the blood pressure?" "Did you take at least two measurements 5 minutes apart?"     184/102 178/102 2. ONSET: "When did you take your blood pressure?"     0945 0947 3. HOW: "How did you obtain the blood pressure?" (e.g., visiting nurse, automatic home BP monitor)     Automatic cuff on right arm 4. HISTORY: "Do you have a history of high blood pressure?"     yes 5. MEDICATIONS: "Are you taking any medications for blood pressure?" "Have you missed any doses recently?"     Yes; has missed no doses 6. OTHER SYMPTOMS: "Do you have any symptoms?" (e.g., headache, chest pain, blurred vision, difficulty breathing, weakness)     no 7. PREGNANCY: "Is there any chance you are pregnant?" "When was your last menstrual period?"     no  Protocols used: HIGH BLOOD PRESSURE-A-AH

## 2017-12-01 NOTE — Progress Notes (Signed)
Subjective:    Patient ID: LORETHA URE, female    DOB: [birth year], 67 y.o.   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  She is a patient of Dr. Sherren Mocha who I am seeing today for management of hypertension. I last saw her on 11/02/2017 at which time she was prescribed Atenolol 75 mg in the morning and 50 mg in the pm. Lisinopril 10 mg daily was added to her regimen.   Her husband has been corresponding with me and sending me blood pressure readings from home. Her blood pressure readings at home continued to be elevated and over the last couple of weeks lisinopril has been increased to the current dose of 10 mg in the morning and 20 mg in the evening.   Her blood pressures were improving until today when her husband checked this morning and she had a BP reading of 180's/100's. Her blood pressure has stayed in this area throughout the day   She is asymptomatic   Review of Systems See HPI   Past Medical History:  Diagnosis Date  . Anxiety   . Arthritis    "probably" (08/25/2012)  . Breast cancer of lower-outer quadrant of left female breast ([place]) 05/25/2014  . Headache(784.0)    "not real frequent" (08/25/2012)  . Heart disease   . HTN (hypertension)   . Hyperlipidemia   . Meningioma ([place]) 1999  . Myocardial infarction ([place]) 1987  . Paresis of lower extremity ([place]) 1999   BLE/notes 08/25/2012  . S/P radiation therapy 07/30/14-08/30/13   left breast cancer/50Gy  . Seizures ([place])    last seizure 1 month ago  . Spastic paraparesis    S/P meningioma resection 1999/notes 08/25/2012  .  Stroke [facility]) 08/25/2012   "that's what they think I've had; sudden LUE weakness" (08/25/2012)    Social History   Socioeconomic History  . Marital status: Married    Spouse name: david  . Number of children: 2  . Years of education: college  . Highest education level: Not on file  Occupational History  . Occupation: disabled  Social Needs  . Financial resource strain: Not on file  . Food insecurity:    Worry: Not on file    Inability: Not on file  . Transportation needs:    Medical: Not on file    Non-medical: Not on file  Tobacco Use  . Smoking status: Former Smoker    Packs/day: 2.00    Years: 10.00    Pack years: 20.00    Types: Cigarettes  . Smokeless tobacco: Never Used  . Tobacco comment: 08/25/2012 "quit smoking cigarettes in 1985 when I had my heart attack"  Substance and Sexual Activity  . Alcohol use: No    Alcohol/week: 4.2 oz    Types: 7 Glasses of wine per week    Comment: occasionally  . Drug use: No  . Sexual activity: Never  Lifestyle  . Physical activity:    Days per week: Not on file    Minutes per session: Not on file  . Stress: Not on file  Relationships  . Social connections:    Talks on phone: Not on file    Gets  together: Not on file    Attends religious service: Not on file    Active member of club or organization: Not on file    Attends meetings of clubs or organizations: Not on file    Relationship status: Not on file  . Intimate partner violence:    Fear of current or ex partner: Not on file    Emotionally abused: Not on file    Physically abused: Not on file    Forced sexual activity: Not on file  Other Topics Concern  . Not on file  Social History Narrative  . Not on file    Past Surgical History:  Procedure Laterality Date  . BRAIN MENINGIOMA EXCISION  1999  . BREAST LUMPECTOMY WITH NEEDLE LOCALIZATION AND AXILLARY SENTINEL LYMPH NODE BX Left 06/22/2014   Procedure: LEFT WIRE LOCALIZED LUMPECTOPMY AND SENTINEL NODE MAPPING;   Surgeon: Autumn Messing III, MD;  Location: [place];  Service: General;  Laterality: Left;  . CARDIAC CATHETERIZATION  1987  . CORONARY ANGIOPLASTY  1987   denied intervention: "blockage wasn't bad enough"    Family History  Problem Relation Age of Onset  . Breast cancer Mother 75       unilateral  . Heart disease Father   . Breast cancer Paternal Grandmother 34       unilateral    Not on File  Current Outpatient Medications on File Prior to Visit  Medication Sig Dispense Refill  . atenolol (TENORMIN) 50 MG tablet Pt is to take 1 1/2 (75mg ) tab in the morning and 1 (50mg ) full tab at night 300 tablet 4  . atorvastatin (LIPITOR) 10 MG tablet TAKE 1 TABLET BY MOUTH EVERY DAY 90 tablet 4  . baclofen (LIORESAL) 20 MG tablet TAKE 1 TABLET (20 MG TOTAL) BY MOUTH 4 (FOUR) TIMES DAILY. 360 tablet 4  . clopidogrel (PLAVIX) 75 MG tablet TAKE 1 TABLET BY MOUTH EVERY DAY WITH BREAKFAST 90 tablet 2  . diazepam (VALIUM) 5 MG tablet Take 1 tablet (5 mg total) by mouth at bedtime. 90 tablet 4  . letrozole (FEMARA) 2.5 MG tablet Take 1 tablet (2.5 mg total) by mouth daily. 90 tablet 4  . lisinopril (PRINIVIL,ZESTRIL) 10 MG tablet Take 10 mg in the morning and 20 mg in the evening 90 tablet 0  . methocarbamol (ROBAXIN) 750 MG tablet Take 1 tablet (750 mg total) by mouth 2 (two) times daily. 180 tablet 4  . Multiple Vitamin (MULTIVITAMIN WITH MINERALS) TABS tablet Take 1 tablet by mouth every morning.     Marland Kitchen OVER THE COUNTER MEDICATION Apply 1 application topically daily as needed (pain). Aspercreme with 5% lidocaine    . Oxcarbazepine (TRILEPTAL) 300 MG tablet Take 1 tablet (300 mg total) by mouth 2 (two) times daily. 180 tablet 3  . oxyCODONE-acetaminophen (PERCOCET) 10-325 MG tablet Take 1 tablet by mouth 3 (three) times daily as needed for pain. 270 tablet 0  . polyethylene glycol (MIRALAX / GLYCOLAX) packet Take 17 g by mouth daily as needed (for constipation). Mix with liquid and drink    . senna  (SENOKOT) 8.6 MG TABS tablet Take 1 tablet by mouth.    . zinc oxide (BALMEX) 11.3 % CREA cream Apply 1 application topically at bedtime as needed (IRRITATION).      No current facility-administered medications on file prior to visit.     BP (!) 180/96   Pulse 70   Temp 99.1 F (37.3 C) (Oral)   SpO2 98%  Objective:   Physical Exam  Constitutional: She is oriented to person, place, and time. She appears well-developed and well-nourished. No distress.  Cardiovascular: Normal rate, regular rhythm, normal heart sounds and intact distal pulses. Exam reveals no gallop and no friction rub.  No murmur heard. Pulmonary/Chest: Effort normal and breath sounds normal. No respiratory distress. She has no wheezes. She has no rales. She exhibits no tenderness.  Musculoskeletal:  In motorized wheelchair   Neurological: She is alert and oriented to person, place, and time.  Skin: Skin is warm and dry. No rash noted. She is not diaphoretic. No erythema. No pallor.  Psychiatric: She has a normal mood and affect. Her behavior is normal. Judgment and thought content normal.  Nursing note and vitals reviewed.     Assessment & Plan:  1. Essential hypertension - Will have her taken an additional dose of lisinopril 10 mg when she gets home. Then 20 mg BID. She has a follow up appointment with her PCP early next week.  - Basic Metabolic Panel - CBC with Differential/Platelet   [name], NP

## 2017-12-06 ENCOUNTER — Ambulatory Visit (INDEPENDENT_AMBULATORY_CARE_PROVIDER_SITE_OTHER): Payer: PPO | Admitting: Family Medicine

## 2017-12-06 ENCOUNTER — Encounter: Payer: Self-pay | Admitting: Family Medicine

## 2017-12-06 VITALS — BP 168/98 | HR 68 | Temp 98.5°F | Wt 145.0 lb

## 2017-12-06 DIAGNOSIS — I10 Essential (primary) hypertension: Secondary | ICD-10-CM | POA: Diagnosis not present

## 2017-12-06 MED ORDER — LISINOPRIL 20 MG PO TABS: ORAL_TABLET | ORAL | 4 refills | Status: DC

## 2017-12-06 MED ORDER — ATENOLOL 50 MG PO TABS: ORAL_TABLET | ORAL | 4 refills | Status: DC

## 2017-12-06 MED ORDER — LISINOPRIL 10 MG PO TABS: ORAL_TABLET | ORAL | 4 refills | Status: DC

## 2017-12-06 NOTE — Progress Notes (Signed)
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 [name] had started her on lisinopril and slowly increased her dose to 20 mg twice a day however blood pressure still not at goal. They bring in blood pressure readings that average 150/100.  She had subsequent renal studies after induction of ace inhibitor renal function was unchanged  BP (!) 168/98 (BP Location: Left Arm, Patient Position: Sitting, Cuff Size: Normal)   Pulse 68   Temp 98.5 F (36.9 C) (Oral)   Wt 145 lb (65.8 kg)   SpO2 96%   BMI 25.69 kg/m  Well-developed well-nourished female no acute distress vital signs stable she's afebrile except for BP 160/98  #1 hypertension not at goal....... increase beta blocker to 75 mg twice a day....... increase lisinopril...... 20 the morning....... 30 mg at bedtime  BP check daily follow-up in 4 weeks.

## 2017-12-06 NOTE — Patient Instructions (Signed)
Tenormin 50 mg......... one half tablets twice daily  Lisinopril.......... 20 mg.....Marland Kitchen one twice daily  Lisinopril 10 mg..........Marland Kitchen 1 at bedtime  Check your morning blood pressure daily around 10 AM  Return in one month for follow-up

## 2017-12-12 ENCOUNTER — Other Ambulatory Visit: Payer: Self-pay | Admitting: Adult Health

## 2017-12-12 DIAGNOSIS — I10 Essential (primary) hypertension: Secondary | ICD-10-CM

## 2017-12-16 NOTE — Telephone Encounter (Signed)
This Rx was refilled on 4/22/2019on her appointment with Dr Sherren Mocha.

## 2018-01-05 ENCOUNTER — Ambulatory Visit: Payer: PPO | Admitting: Family Medicine

## 2018-01-06 ENCOUNTER — Other Ambulatory Visit: Payer: Self-pay | Admitting: Neurology

## 2018-01-06 DIAGNOSIS — R569 Unspecified convulsions: Secondary | ICD-10-CM

## 2018-01-11 ENCOUNTER — Encounter: Payer: Self-pay | Admitting: Family Medicine

## 2018-01-11 ENCOUNTER — Ambulatory Visit (INDEPENDENT_AMBULATORY_CARE_PROVIDER_SITE_OTHER): Payer: PPO | Admitting: Family Medicine

## 2018-01-11 VITALS — BP 152/90 | HR 75 | Temp 98.3°F | Wt 145.0 lb

## 2018-01-11 DIAGNOSIS — Z9889 Other specified postprocedural states: Secondary | ICD-10-CM | POA: Diagnosis not present

## 2018-01-11 DIAGNOSIS — I10 Essential (primary) hypertension: Secondary | ICD-10-CM | POA: Diagnosis not present

## 2018-01-11 DIAGNOSIS — Z8603 Personal history of neoplasm of uncertain behavior: Secondary | ICD-10-CM | POA: Diagnosis not present

## 2018-01-11 DIAGNOSIS — Z8673 Personal history of transient ischemic attack (TIA), and cerebral infarction without residual deficits: Secondary | ICD-10-CM

## 2018-01-11 MED ORDER — OXYCODONE-ACETAMINOPHEN 10-325 MG PO TABS: 1.0000 | ORAL_TABLET | Freq: Three times a day (TID) | ORAL | 0 refills | Status: DC | PRN

## 2018-01-11 NOTE — Progress Notes (Signed)
Stacy Diaz is a 67 year old married female nonsmoker comes in today for evaluation of 2 issues  She has a history of hypertension and is on Tenormin 75 mg twice a day and lisinopril 20 mg in the morning and 30 mg at bedtime. BP here today 152/90 but her pressure at home is averaging 135/85. Pulse varies between 50 and 75. She is not lightheaded. She is confined to wheelchair because of the complications from a brain tumor and surgery. But she says she is not lightheaded when she assumes the upright position.  She takes Percocet 10 mg-325 3 times a day for chronic pain and Robaxin 750 mg twice a day for muscle spasm. She tosses takes Valium 5 mg at bedtime for sleep. Overall doing well. Still confined with a wheelchair details of which see previous notes  BP (!) 152/90 (BP Location: Left Arm, Patient Position: Sitting, Cuff Size: Large)   Pulse 75   Temp 98.3 F (36.8 C) (Oral)   Wt 145 lb (65.8 kg)   SpO2 97%   BMI 25.69 kg/m  Well-developed well-nourished female no acute distress. BP right arm sitting position 152/92 over BP at home is 135/85.  #1 hypertension........... continue current treatment program monitor blood pressure at home. If it's consistently 135/85 or greater then call and we will adjust her medication  #2 chronic pain......... continue Percocet and Robaxin.

## 2018-01-11 NOTE — Patient Instructions (Signed)
Continue current medications  If your blood pressure is consistently above 140/90 then send [place] a message on my chart and we will adjust your medication  Sinus message on my chart a week before being out of your Percocet for refills

## 2018-03-18 ENCOUNTER — Other Ambulatory Visit: Payer: Self-pay | Admitting: Hematology and Oncology

## 2018-03-18 DIAGNOSIS — C50512 Malignant neoplasm of lower-outer quadrant of left female breast: Secondary | ICD-10-CM

## 2018-03-18 DIAGNOSIS — Z170 Estrogen receptor positive status [ER+]: Secondary | ICD-10-CM

## 2018-03-23 ENCOUNTER — Inpatient Hospital Stay: Payer: PPO | Attending: Hematology and Oncology | Admitting: Hematology and Oncology

## 2018-03-23 ENCOUNTER — Telehealth: Payer: Self-pay | Admitting: Hematology and Oncology

## 2018-03-23 DIAGNOSIS — C50512 Malignant neoplasm of lower-outer quadrant of left female breast: Secondary | ICD-10-CM | POA: Diagnosis not present

## 2018-03-23 DIAGNOSIS — Z170 Estrogen receptor positive status [ER+]: Secondary | ICD-10-CM

## 2018-03-23 DIAGNOSIS — M791 Myalgia, unspecified site: Secondary | ICD-10-CM | POA: Diagnosis not present

## 2018-03-23 DIAGNOSIS — Z8673 Personal history of transient ischemic attack (TIA), and cerebral infarction without residual deficits: Secondary | ICD-10-CM | POA: Diagnosis not present

## 2018-03-23 DIAGNOSIS — Z923 Personal history of irradiation: Secondary | ICD-10-CM

## 2018-03-23 DIAGNOSIS — M2550 Pain in unspecified joint: Secondary | ICD-10-CM

## 2018-03-23 DIAGNOSIS — Z79811 Long term (current) use of aromatase inhibitors: Secondary | ICD-10-CM | POA: Insufficient documentation

## 2018-03-23 MED ORDER — LETROZOLE 2.5 MG PO TABS: 2.5000 mg | ORAL_TABLET | Freq: Every day | ORAL | 3 refills | Status: DC

## 2018-03-23 NOTE — Telephone Encounter (Signed)
Gave patient avs report and appointments for August 2020.

## 2018-03-23 NOTE — Assessment & Plan Note (Signed)
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 Oncotype DX recurrence score 16; 10% risk of recurrence, low risk did not need chemotherapy status post radiation therapy completed January 2016, started antiestrogen therapy with Arimidex 1 mg daily 09/07/2014 Switched to letrozole November 2016.  letrozole toxicities: Patient has chronic muscle stiffness and aches. These symptoms are much better on letrozole then on anastrozole.  Seizures/CVA: Hospitalization October 2017 Myalgias and arthralgias:  tried tonic water  Breast Cancer Surveillance: 1. Breast exam: 03/23/2018 scar tissue was palpated and no concerns were identified  2. Mammogram12/15/2018 No abnormalities. Postsurgical changes. Breast density category C. I recommended that she get 3-D mammograms for surveillance.  Return to clinic in 1 year for follow-up

## 2018-03-23 NOTE — Progress Notes (Signed)
Patient Care Team: Dorena Cookey, MD as PCP - General Jovita Kussmaul, MD as Consulting Physician (General Surgery) Nicholas Lose, MD as Consulting Physician (Hematology and Oncology) Kyung Rudd, MD as Consulting Physician (Radiation Oncology)  DIAGNOSIS:  Encounter Diagnosis  Name Primary?  . Malignant neoplasm of lower-outer quadrant of left breast of female, estrogen receptor positive ([place])     SUMMARY OF ONCOLOGIC HISTORY:   Breast cancer of lower-outer quadrant of left female breast ([place])   05/21/2014 Mammogram    Mammogram and Ultrasound: Left breast 2 cm complex cystic asymmetric abnormality      05/25/2014 Initial Diagnosis    Invasive mammary cancer with lobular features with perineural invasion, grade 2, ER positive PR positive HER-2 negative, Ki-67 25%      06/22/2014 Surgery    Breast lumpectomy: Invasive lobular carcinoma, grade 3, 2.7 cm, with LCIS, ER positive, PR positive, HER-2 negative, Ki-67 65%, T2, N0, M0 stage II A3 SLN negative; oncotype 16 low risk 10% ROR      07/30/2014 - 08/28/2014 Radiation Therapy    Adjuvant radiation      09/07/2014 -  Anti-estrogen oral therapy    Anastrazole 1 mg daily switch to letrozole 06/25/2015 for myalgias      06/02/2016 - 06/05/2016 Hospital Admission    TIA/CVA/seizures       CHIEF COMPLIANT: Follow-up on letrozole therapy  INTERVAL HISTORY: Stacy Diaz is a 67 year old with above-mentioned history of left breast cancer treated with lumpectomy radiation and is currently on letrozole therapy.  She appears to be tolerating it fairly well.  She has multiple sclerosis and has diffuse muscle aches and pains.  She does not attribute these to letrozole therapy.  She felt scar tissue and bone underneath the breast.  This was evaluated by mammogram and it was felt to be scar tissue.  Denies any other problems or concerns. Her husband is trying to lose weight so he can get a hip replacement surgery.  He has lost 50  pounds.  REVIEW OF SYSTEMS:   Constitutional: Denies fevers, chills or abnormal weight loss Eyes: Denies blurriness of vision Ears, nose, mouth, throat, and face: Denies mucositis or sore throat Respiratory: Denies cough, dyspnea or wheezes Cardiovascular: Denies palpitation, chest discomfort Gastrointestinal:  Denies nausea, heartburn or change in bowel habits Skin: Denies abnormal skin rashes Lymphatics: Denies new lymphadenopathy or easy bruising Neurological:Denies numbness, tingling or new weaknesses Behavioral/Psych: Mood is stable, no new changes  Extremities: No lower extremity edema Breast:   left breast scar tissue All other systems were reviewed with the patient and are negative.  I have reviewed the past medical history, past surgical history, social history and family history with the patient and they are unchanged from previous note.  ALLERGIES:  has no allergies on file.  MEDICATIONS:  Current Outpatient Medications  Medication Sig Dispense Refill  . atenolol (TENORMIN) 50 MG tablet 1-1/2 tablets twice daily 300 tablet 4  . atorvastatin (LIPITOR) 10 MG tablet TAKE 1 TABLET BY MOUTH EVERY DAY 90 tablet 4  . baclofen (LIORESAL) 20 MG tablet TAKE 1 TABLET (20 MG TOTAL) BY MOUTH 4 (FOUR) TIMES DAILY. 360 tablet 4  . clopidogrel (PLAVIX) 75 MG tablet TAKE 1 TABLET BY MOUTH EVERY DAY WITH BREAKFAST 90 tablet 2  . diazepam (VALIUM) 5 MG tablet Take 1 tablet (5 mg total) by mouth at bedtime. 90 tablet 4  . letrozole (FEMARA) 2.5 MG tablet Take 1 tablet (2.5 mg total) by mouth daily.  90 tablet 3  . lisinopril (PRINIVIL,ZESTRIL) 10 MG tablet 1 by mouth daily at bedtime 90 tablet 4  . lisinopril (PRINIVIL,ZESTRIL) 20 MG tablet 1 by mouth twice a day 200 tablet 4  . methocarbamol (ROBAXIN) 750 MG tablet Take 1 tablet (750 mg total) by mouth 2 (two) times daily. 180 tablet 4  . Multiple Vitamin (MULTIVITAMIN WITH MINERALS) TABS tablet Take 1 tablet by mouth every morning.     Marland Kitchen  OVER THE COUNTER MEDICATION Apply 1 application topically daily as needed (pain). Aspercreme with 5% lidocaine    . Oxcarbazepine (TRILEPTAL) 300 MG tablet TAKE 1 TABLET (300 MG TOTAL) BY MOUTH 2 (TWO) TIMES DAILY. 180 tablet 1  . oxyCODONE-acetaminophen (PERCOCET) 10-325 MG tablet Take 1 tablet by mouth 3 (three) times daily as needed for pain. 270 tablet 0  . polyethylene glycol (MIRALAX / GLYCOLAX) packet Take 17 g by mouth daily as needed (for constipation). Mix with liquid and drink    . senna (SENOKOT) 8.6 MG TABS tablet Take 1 tablet by mouth.    . zinc oxide (BALMEX) 11.3 % CREA cream Apply 1 application topically at bedtime as needed (IRRITATION).      No current facility-administered medications for this visit.     PHYSICAL EXAMINATION: ECOG PERFORMANCE STATUS: 1 - Symptomatic but completely ambulatory  Vitals:   03/23/18 1533  BP: (!) 163/83  Pulse: 64  Resp: 18  Temp: 98.4 F (36.9 C)  SpO2: 100%   Filed Weights    GENERAL:alert, no distress and comfortable SKIN: skin color, texture, turgor are normal, no rashes or significant lesions EYES: normal, Conjunctiva are pink and non-injected, sclera clear OROPHARYNX:no exudate, no erythema and lips, buccal mucosa, and tongue normal  NECK: supple, thyroid normal size, non-tender, without nodularity LYMPH:  no palpable lymphadenopathy in the cervical, axillary or inguinal LUNGS: clear to auscultation and percussion with normal breathing effort HEART: regular rate & rhythm and no murmurs and no lower extremity edema ABDOMEN:abdomen soft, non-tender and normal bowel sounds MUSCULOSKELETAL:no cyanosis of digits and no clubbing  NEURO: alert & oriented x 3 with fluent speech, no focal motor/sensory deficits EXTREMITIES: No lower extremity edema BREAST no palpable lumps or nodules of concern.  Scar tissue in the left breast (exam performed in t no palpable lumps or nodules of concern scar tissue in the left breast he presence of a  chaperone)  LABORATORY DATA:  I have reviewed the data as listed CMP Latest Ref Rng & Units 12/01/2017 06/03/2016 06/02/2016  Glucose 70 - 99 mg/dL 91 89 109(H)  BUN 6 - 23 mg/dL _0 Creatinine 0.40 - 1.20 mg/dL 0.54 0.49 0.50  Sodium 135 - 145 mEq/L 130(L) 134(L) 134(L)  Potassium 3.5 - 5.1 mEq/L 4.2 3.8 4.6  Chloride 96 - 112 mEq/L 96 101 99(L)  CO2 19 - 32 mEq/L 25 22 -  Calcium 8.4 - 10.5 mg/dL 9.8 9.4 -  Total Protein 6.5 - 8.1 g/dL - - -  Total Bilirubin 0.3 - 1.2 mg/dL - - -  Alkaline Phos 38 - 126 U/L - - -  AST 15 - 41 U/L - - -  ALT 14 - 54 U/L - - -    Lab Results  Component Value Date   WBC 9.2 12/01/2017   HGB 15.8 (H) 12/01/2017   HCT 45.4 12/01/2017   MCV 89.4 12/01/2017   PLT 233.0 12/01/2017   NEUTROABS 7.2 12/01/2017    ASSESSMENT & PLAN:  Breast cancer of  lower-outer quadrant of left female breast 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 Oncotype DX recurrence score 16; 10% risk of recurrence, low risk did not need chemotherapy status post radiation therapy completed January 2016, started antiestrogen therapy with Arimidex 1 mg daily 09/07/2014 Switched to letrozole November 2016.  letrozole toxicities: Patient has chronic muscle stiffness and aches. These symptoms are much better on letrozole then on anastrozole.  Seizures/CVA: Hospitalization October 2017 Myalgias and arthralgias:  tried tonic water  Breast Cancer Surveillance: 1. Breast exam: 03/23/2018 scar tissue was palpated and no concerns were identified  2. Mammogram12/15/2018 No abnormalities. Postsurgical changes. Breast density category C. I recommended that she get 3-D mammograms for surveillance.  Return to clinic in 1 year for follow-up  No orders of the defined types were placed in this encounter.  The patient has a good understanding of the overall plan. she agrees with it. she will call  with any problems that may develop before the next visit here.   Harriette Ohara, MD 03/23/18

## 2018-04-25 ENCOUNTER — Other Ambulatory Visit: Payer: Self-pay | Admitting: Family Medicine

## 2018-04-26 ENCOUNTER — Emergency Department (HOSPITAL_COMMUNITY): Payer: PPO

## 2018-04-26 ENCOUNTER — Inpatient Hospital Stay (HOSPITAL_COMMUNITY): Payer: PPO

## 2018-04-26 ENCOUNTER — Encounter (HOSPITAL_COMMUNITY): Payer: Self-pay

## 2018-04-26 ENCOUNTER — Inpatient Hospital Stay (HOSPITAL_COMMUNITY): Payer: PPO | Admitting: Anesthesiology

## 2018-04-26 ENCOUNTER — Inpatient Hospital Stay (HOSPITAL_COMMUNITY)
Admission: EM | Admit: 2018-04-26 | Discharge: 2018-04-28 | DRG: 493 | Disposition: A | Discharge status: Skilled Nursing Facility | Payer: PPO | Attending: Internal Medicine | Admitting: Internal Medicine

## 2018-04-26 ENCOUNTER — Ambulatory Visit: Payer: Self-pay

## 2018-04-26 ENCOUNTER — Encounter (HOSPITAL_COMMUNITY)
Admission: EM | Disposition: A | Discharge status: Skilled Nursing Facility | Payer: Self-pay | Source: Home / Self Care | Attending: Internal Medicine

## 2018-04-26 ENCOUNTER — Other Ambulatory Visit: Payer: Self-pay

## 2018-04-26 DIAGNOSIS — X501XXA Overexertion from prolonged static or awkward postures, initial encounter: Secondary | ICD-10-CM | POA: Diagnosis not present

## 2018-04-26 DIAGNOSIS — Z8603 Personal history of neoplasm of uncertain behavior: Secondary | ICD-10-CM

## 2018-04-26 DIAGNOSIS — Z923 Personal history of irradiation: Secondary | ICD-10-CM | POA: Diagnosis not present

## 2018-04-26 DIAGNOSIS — C50919 Malignant neoplasm of unspecified site of unspecified female breast: Secondary | ICD-10-CM | POA: Diagnosis present

## 2018-04-26 DIAGNOSIS — Z79899 Other long term (current) drug therapy: Secondary | ICD-10-CM

## 2018-04-26 DIAGNOSIS — M2550 Pain in unspecified joint: Secondary | ICD-10-CM | POA: Diagnosis not present

## 2018-04-26 DIAGNOSIS — E8889 Other specified metabolic disorders: Secondary | ICD-10-CM | POA: Diagnosis present

## 2018-04-26 DIAGNOSIS — Z993 Dependence on wheelchair: Secondary | ICD-10-CM | POA: Diagnosis not present

## 2018-04-26 DIAGNOSIS — Z86011 Personal history of benign neoplasm of the brain: Secondary | ICD-10-CM | POA: Diagnosis not present

## 2018-04-26 DIAGNOSIS — T148XXA Other injury of unspecified body region, initial encounter: Secondary | ICD-10-CM

## 2018-04-26 DIAGNOSIS — G40209 Localization-related (focal) (partial) symptomatic epilepsy and epileptic syndromes with complex partial seizures, not intractable, without status epilepticus: Secondary | ICD-10-CM | POA: Diagnosis not present

## 2018-04-26 DIAGNOSIS — R609 Edema, unspecified: Secondary | ICD-10-CM | POA: Diagnosis not present

## 2018-04-26 DIAGNOSIS — N390 Urinary tract infection, site not specified: Secondary | ICD-10-CM | POA: Diagnosis present

## 2018-04-26 DIAGNOSIS — Z7902 Long term (current) use of antithrombotics/antiplatelets: Secondary | ICD-10-CM

## 2018-04-26 DIAGNOSIS — E785 Hyperlipidemia, unspecified: Secondary | ICD-10-CM | POA: Diagnosis not present

## 2018-04-26 DIAGNOSIS — S82201A Unspecified fracture of shaft of right tibia, initial encounter for closed fracture: Secondary | ICD-10-CM | POA: Diagnosis not present

## 2018-04-26 DIAGNOSIS — D62 Acute posthemorrhagic anemia: Secondary | ICD-10-CM | POA: Diagnosis not present

## 2018-04-26 DIAGNOSIS — S82401A Unspecified fracture of shaft of right fibula, initial encounter for closed fracture: Secondary | ICD-10-CM

## 2018-04-26 DIAGNOSIS — M545 Low back pain: Secondary | ICD-10-CM | POA: Diagnosis not present

## 2018-04-26 DIAGNOSIS — M6281 Muscle weakness (generalized): Secondary | ICD-10-CM | POA: Diagnosis not present

## 2018-04-26 DIAGNOSIS — Z803 Family history of malignant neoplasm of breast: Secondary | ICD-10-CM

## 2018-04-26 DIAGNOSIS — Z8673 Personal history of transient ischemic attack (TIA), and cerebral infarction without residual deficits: Secondary | ICD-10-CM

## 2018-04-26 DIAGNOSIS — S82252A Displaced comminuted fracture of shaft of left tibia, initial encounter for closed fracture: Secondary | ICD-10-CM | POA: Diagnosis not present

## 2018-04-26 DIAGNOSIS — G8220 Paraplegia, unspecified: Secondary | ICD-10-CM | POA: Diagnosis not present

## 2018-04-26 DIAGNOSIS — I10 Essential (primary) hypertension: Secondary | ICD-10-CM | POA: Diagnosis not present

## 2018-04-26 DIAGNOSIS — I252 Old myocardial infarction: Secondary | ICD-10-CM

## 2018-04-26 DIAGNOSIS — G8320 Monoplegia of upper limb affecting unspecified side: Secondary | ICD-10-CM | POA: Diagnosis not present

## 2018-04-26 DIAGNOSIS — Z9889 Other specified postprocedural states: Secondary | ICD-10-CM | POA: Diagnosis not present

## 2018-04-26 DIAGNOSIS — S8990XA Unspecified injury of unspecified lower leg, initial encounter: Secondary | ICD-10-CM | POA: Diagnosis present

## 2018-04-26 DIAGNOSIS — R41841 Cognitive communication deficit: Secondary | ICD-10-CM | POA: Diagnosis not present

## 2018-04-26 DIAGNOSIS — I2510 Atherosclerotic heart disease of native coronary artery without angina pectoris: Secondary | ICD-10-CM | POA: Diagnosis not present

## 2018-04-26 DIAGNOSIS — S82201S Unspecified fracture of shaft of right tibia, sequela: Secondary | ICD-10-CM | POA: Diagnosis not present

## 2018-04-26 DIAGNOSIS — S8490XS Injury of unspecified nerve at lower leg level, unspecified leg, sequela: Secondary | ICD-10-CM | POA: Diagnosis not present

## 2018-04-26 DIAGNOSIS — Z79811 Long term (current) use of aromatase inhibitors: Secondary | ICD-10-CM

## 2018-04-26 DIAGNOSIS — Z7401 Bed confinement status: Secondary | ICD-10-CM | POA: Diagnosis not present

## 2018-04-26 DIAGNOSIS — R52 Pain, unspecified: Secondary | ICD-10-CM

## 2018-04-26 DIAGNOSIS — Z4789 Encounter for other orthopedic aftercare: Secondary | ICD-10-CM | POA: Diagnosis not present

## 2018-04-26 DIAGNOSIS — F419 Anxiety disorder, unspecified: Secondary | ICD-10-CM | POA: Diagnosis not present

## 2018-04-26 DIAGNOSIS — Z87891 Personal history of nicotine dependence: Secondary | ICD-10-CM

## 2018-04-26 DIAGNOSIS — R293 Abnormal posture: Secondary | ICD-10-CM | POA: Diagnosis not present

## 2018-04-26 DIAGNOSIS — S82401S Unspecified fracture of shaft of right fibula, sequela: Secondary | ICD-10-CM | POA: Diagnosis not present

## 2018-04-26 DIAGNOSIS — Z8249 Family history of ischemic heart disease and other diseases of the circulatory system: Secondary | ICD-10-CM

## 2018-04-26 DIAGNOSIS — C50512 Malignant neoplasm of lower-outer quadrant of left female breast: Secondary | ICD-10-CM | POA: Diagnosis not present

## 2018-04-26 DIAGNOSIS — R0902 Hypoxemia: Secondary | ICD-10-CM | POA: Diagnosis not present

## 2018-04-26 DIAGNOSIS — S82251A Displaced comminuted fracture of shaft of right tibia, initial encounter for closed fracture: Principal | ICD-10-CM | POA: Diagnosis present

## 2018-04-26 HISTORY — PX: TIBIA IM NAIL INSERTION: SHX2516

## 2018-04-26 LAB — BASIC METABOLIC PANEL
ANION GAP: 14 (ref 5–15)
BUN: 10 mg/dL (ref 8–23)
CALCIUM: 9 mg/dL (ref 8.9–10.3)
CO2: 18 mmol/L — AB (ref 22–32)
CREATININE: 0.6 mg/dL (ref 0.44–1.00)
Chloride: 99 mmol/L (ref 98–111)
Glucose, Bld: 108 mg/dL — ABNORMAL HIGH (ref 70–99)
Potassium: 5.3 mmol/L — ABNORMAL HIGH (ref 3.5–5.1)
SODIUM: 131 mmol/L — AB (ref 135–145)

## 2018-04-26 LAB — CBC
HEMATOCRIT: 41.1 % (ref 36.0–46.0)
HEMOGLOBIN: 13.6 g/dL (ref 12.0–15.0)
MCH: 30.6 pg (ref 26.0–34.0)
MCHC: 33.1 g/dL (ref 30.0–36.0)
MCV: 92.4 fL (ref 78.0–100.0)
Platelets: 197 10*3/uL (ref 150–400)
RBC: 4.45 MIL/uL (ref 3.87–5.11)
RDW: 12.7 % (ref 11.5–15.5)
WBC: 9.9 10*3/uL (ref 4.0–10.5)

## 2018-04-26 SURGERY — INSERTION, INTRAMEDULLARY ROD, TIBIA
Anesthesia: General | Laterality: Right

## 2018-04-26 MED ORDER — ENOXAPARIN SODIUM 40 MG/0.4ML ~~LOC~~ SOLN
40.0000 mg | SUBCUTANEOUS | Status: DC
  Administered 2018-04-27 – 2018-04-28 (×2): 40 mg via SUBCUTANEOUS
  Filled 2018-04-26 (×2): qty 0.4

## 2018-04-26 MED ORDER — POLYETHYLENE GLYCOL 3350 17 G PO PACK: 17.0000 g | PACK | Freq: Every day | ORAL | Status: DC | PRN

## 2018-04-26 MED ORDER — ONDANSETRON HCL 4 MG/2ML IJ SOLN
INTRAMUSCULAR | Status: DC | PRN
  Administered 2018-04-26: 4 mg via INTRAVENOUS

## 2018-04-26 MED ORDER — ATENOLOL 50 MG PO TABS
75.0000 mg | ORAL_TABLET | Freq: Two times a day (BID) | ORAL | Status: DC
  Administered 2018-04-26 – 2018-04-28 (×4): 75 mg via ORAL
  Filled 2018-04-26 (×5): qty 1

## 2018-04-26 MED ORDER — LETROZOLE 2.5 MG PO TABS
2.5000 mg | ORAL_TABLET | Freq: Every day | ORAL | Status: DC
  Administered 2018-04-27 – 2018-04-28 (×2): 2.5 mg via ORAL
  Filled 2018-04-26 (×2): qty 1

## 2018-04-26 MED ORDER — METHOCARBAMOL 1000 MG/10ML IJ SOLN
500.0000 mg | Freq: Four times a day (QID) | INTRAVENOUS | Status: DC | PRN
  Filled 2018-04-26: qty 5

## 2018-04-26 MED ORDER — DEXAMETHASONE SODIUM PHOSPHATE 10 MG/ML IJ SOLN
INTRAMUSCULAR | Status: DC | PRN
  Administered 2018-04-26: 10 mg via INTRAVENOUS

## 2018-04-26 MED ORDER — ACETAMINOPHEN 10 MG/ML IV SOLN: 1000.0000 mg | Freq: Once | INTRAVENOUS | Status: DC | PRN

## 2018-04-26 MED ORDER — OXYCODONE-ACETAMINOPHEN 5-325 MG PO TABS
1.0000 | ORAL_TABLET | Freq: Three times a day (TID) | ORAL | Status: DC
  Administered 2018-04-26 – 2018-04-28 (×6): 1 via ORAL
  Filled 2018-04-26 (×6): qty 1

## 2018-04-26 MED ORDER — ZINC OXIDE 11.3 % EX CREA
1.0000 "application " | TOPICAL_CREAM | Freq: Every evening | CUTANEOUS | Status: DC | PRN
  Filled 2018-04-26: qty 56

## 2018-04-26 MED ORDER — MEPERIDINE HCL 50 MG/ML IJ SOLN: 6.2500 mg | INTRAMUSCULAR | Status: DC | PRN

## 2018-04-26 MED ORDER — OXYCODONE HCL 5 MG PO TABS
5.0000 mg | ORAL_TABLET | Freq: Three times a day (TID) | ORAL | Status: DC
  Administered 2018-04-26 – 2018-04-28 (×6): 5 mg via ORAL
  Filled 2018-04-26 (×6): qty 1

## 2018-04-26 MED ORDER — ONDANSETRON HCL 4 MG PO TABS: 4.0000 mg | ORAL_TABLET | Freq: Four times a day (QID) | ORAL | Status: DC | PRN

## 2018-04-26 MED ORDER — DIAZEPAM 5 MG PO TABS
5.0000 mg | ORAL_TABLET | Freq: Every day | ORAL | Status: DC
  Administered 2018-04-26 – 2018-04-27 (×2): 5 mg via ORAL
  Filled 2018-04-26 (×2): qty 1

## 2018-04-26 MED ORDER — PHENYLEPHRINE 40 MCG/ML (10ML) SYRINGE FOR IV PUSH (FOR BLOOD PRESSURE SUPPORT)
PREFILLED_SYRINGE | INTRAVENOUS | Status: DC | PRN
  Administered 2018-04-26 (×2): 60 ug via INTRAVENOUS

## 2018-04-26 MED ORDER — METOCLOPRAMIDE HCL 5 MG/ML IJ SOLN: 5.0000 mg | Freq: Three times a day (TID) | INTRAMUSCULAR | Status: DC | PRN

## 2018-04-26 MED ORDER — CEFAZOLIN SODIUM-DEXTROSE 2-4 GM/100ML-% IV SOLN
2.0000 g | INTRAVENOUS | Status: AC
  Administered 2018-04-26: 2 g via INTRAVENOUS

## 2018-04-26 MED ORDER — METOCLOPRAMIDE HCL 5 MG PO TABS: 5.0000 mg | ORAL_TABLET | Freq: Three times a day (TID) | ORAL | Status: DC | PRN

## 2018-04-26 MED ORDER — LIDOCAINE 2% (20 MG/ML) 5 ML SYRINGE
INTRAMUSCULAR | Status: AC
  Filled 2018-04-26: qty 5

## 2018-04-26 MED ORDER — OXCARBAZEPINE 300 MG PO TABS
300.0000 mg | ORAL_TABLET | Freq: Two times a day (BID) | ORAL | Status: DC
  Administered 2018-04-26 – 2018-04-28 (×4): 300 mg via ORAL
  Filled 2018-04-26 (×5): qty 1

## 2018-04-26 MED ORDER — OXYCODONE-ACETAMINOPHEN 10-325 MG PO TABS: 1.0000 | ORAL_TABLET | Freq: Three times a day (TID) | ORAL | Status: DC

## 2018-04-26 MED ORDER — ONDANSETRON 4 MG PO TBDP
8.0000 mg | ORAL_TABLET | Freq: Once | ORAL | Status: AC
  Administered 2018-04-26: 8 mg via ORAL
  Filled 2018-04-26: qty 2

## 2018-04-26 MED ORDER — BACLOFEN 20 MG PO TABS
20.0000 mg | ORAL_TABLET | Freq: Four times a day (QID) | ORAL | Status: DC
  Administered 2018-04-26 – 2018-04-28 (×9): 20 mg via ORAL
  Filled 2018-04-26 (×2): qty 1
  Filled 2018-04-26: qty 2
  Filled 2018-04-26 (×7): qty 1
  Filled 2018-04-26: qty 2
  Filled 2018-04-26: qty 1
  Filled 2018-04-26 (×5): qty 2

## 2018-04-26 MED ORDER — CEFAZOLIN SODIUM-DEXTROSE 2-4 GM/100ML-% IV SOLN
INTRAVENOUS | Status: AC
  Filled 2018-04-26: qty 100

## 2018-04-26 MED ORDER — SUGAMMADEX SODIUM 200 MG/2ML IV SOLN
INTRAVENOUS | Status: DC | PRN
  Administered 2018-04-26: 200 mg via INTRAVENOUS

## 2018-04-26 MED ORDER — EPHEDRINE SULFATE 50 MG/ML IJ SOLN
INTRAMUSCULAR | Status: DC | PRN
  Administered 2018-04-26: 10 mg via INTRAVENOUS

## 2018-04-26 MED ORDER — CHLORHEXIDINE GLUCONATE 4 % EX LIQD: 60.0000 mL | Freq: Once | CUTANEOUS | Status: DC

## 2018-04-26 MED ORDER — LACTATED RINGERS IV SOLN
INTRAVENOUS | Status: DC
  Administered 2018-04-26 (×3): via INTRAVENOUS

## 2018-04-26 MED ORDER — SENNA 8.6 MG PO TABS
1.0000 | ORAL_TABLET | Freq: Every day | ORAL | Status: DC
  Administered 2018-04-27 – 2018-04-28 (×2): 8.6 mg via ORAL
  Filled 2018-04-26 (×2): qty 1

## 2018-04-26 MED ORDER — FENTANYL CITRATE (PF) 250 MCG/5ML IJ SOLN
INTRAMUSCULAR | Status: AC
  Filled 2018-04-26: qty 5

## 2018-04-26 MED ORDER — ONDANSETRON HCL 4 MG/2ML IJ SOLN
4.0000 mg | Freq: Four times a day (QID) | INTRAMUSCULAR | Status: DC | PRN
  Filled 2018-04-26: qty 2

## 2018-04-26 MED ORDER — POVIDONE-IODINE 10 % EX SWAB: 2.0000 "application " | Freq: Once | CUTANEOUS | Status: DC

## 2018-04-26 MED ORDER — CLOPIDOGREL BISULFATE 75 MG PO TABS
75.0000 mg | ORAL_TABLET | Freq: Every day | ORAL | Status: DC
  Administered 2018-04-27 – 2018-04-28 (×2): 75 mg via ORAL
  Filled 2018-04-26 (×2): qty 1

## 2018-04-26 MED ORDER — ROCURONIUM BROMIDE 10 MG/ML (PF) SYRINGE
PREFILLED_SYRINGE | INTRAVENOUS | Status: DC | PRN
  Administered 2018-04-26: 40 mg via INTRAVENOUS
  Administered 2018-04-26: 10 mg via INTRAVENOUS

## 2018-04-26 MED ORDER — MIDAZOLAM HCL 5 MG/5ML IJ SOLN
INTRAMUSCULAR | Status: DC | PRN
  Administered 2018-04-26: 2 mg via INTRAVENOUS

## 2018-04-26 MED ORDER — HYDROMORPHONE HCL 1 MG/ML IJ SOLN
INTRAMUSCULAR | Status: AC
  Administered 2018-04-26: 0.5 mg via INTRAVENOUS
  Filled 2018-04-26: qty 1

## 2018-04-26 MED ORDER — PROPOFOL 10 MG/ML IV BOLUS
INTRAVENOUS | Status: AC
  Filled 2018-04-26: qty 20

## 2018-04-26 MED ORDER — LISINOPRIL 20 MG PO TABS
20.0000 mg | ORAL_TABLET | Freq: Every day | ORAL | Status: DC
  Administered 2018-04-27 – 2018-04-28 (×2): 20 mg via ORAL
  Filled 2018-04-26 (×2): qty 1

## 2018-04-26 MED ORDER — LISINOPRIL 10 MG PO TABS
10.0000 mg | ORAL_TABLET | Freq: Every day | ORAL | Status: DC
  Administered 2018-04-26 – 2018-04-27 (×2): 10 mg via ORAL
  Filled 2018-04-26 (×2): qty 1

## 2018-04-26 MED ORDER — METHOCARBAMOL 500 MG PO TABS
500.0000 mg | ORAL_TABLET | Freq: Four times a day (QID) | ORAL | Status: DC | PRN
  Administered 2018-04-26: 500 mg via ORAL
  Filled 2018-04-26 (×2): qty 1

## 2018-04-26 MED ORDER — HYDROCODONE-ACETAMINOPHEN 7.5-325 MG PO TABS: 1.0000 | ORAL_TABLET | Freq: Once | ORAL | Status: DC | PRN

## 2018-04-26 MED ORDER — FENTANYL CITRATE (PF) 250 MCG/5ML IJ SOLN
INTRAMUSCULAR | Status: DC | PRN
  Administered 2018-04-26: 50 ug via INTRAVENOUS
  Administered 2018-04-26 (×2): 100 ug via INTRAVENOUS

## 2018-04-26 MED ORDER — DOCUSATE SODIUM 100 MG PO CAPS
100.0000 mg | ORAL_CAPSULE | Freq: Two times a day (BID) | ORAL | Status: DC
  Administered 2018-04-26 – 2018-04-28 (×4): 100 mg via ORAL
  Filled 2018-04-26 (×4): qty 1

## 2018-04-26 MED ORDER — MORPHINE SULFATE (PF) 2 MG/ML IV SOLN
0.5000 mg | INTRAVENOUS | Status: DC | PRN
  Administered 2018-04-26 – 2018-04-28 (×3): 0.5 mg via INTRAVENOUS
  Filled 2018-04-26 (×3): qty 1

## 2018-04-26 MED ORDER — PROMETHAZINE HCL 25 MG/ML IJ SOLN: 6.2500 mg | INTRAMUSCULAR | Status: DC | PRN

## 2018-04-26 MED ORDER — LIDOCAINE 2% (20 MG/ML) 5 ML SYRINGE
INTRAMUSCULAR | Status: DC | PRN
  Administered 2018-04-26: 100 mg via INTRAVENOUS

## 2018-04-26 MED ORDER — CEFAZOLIN SODIUM-DEXTROSE 1-4 GM/50ML-% IV SOLN
1.0000 g | Freq: Four times a day (QID) | INTRAVENOUS | Status: AC
  Administered 2018-04-26 – 2018-04-27 (×3): 1 g via INTRAVENOUS
  Filled 2018-04-26 (×3): qty 50

## 2018-04-26 MED ORDER — EPHEDRINE 5 MG/ML INJ
INTRAVENOUS | Status: AC
  Filled 2018-04-26: qty 10

## 2018-04-26 MED ORDER — PROPOFOL 10 MG/ML IV BOLUS
INTRAVENOUS | Status: DC | PRN
  Administered 2018-04-26: 150 mg via INTRAVENOUS

## 2018-04-26 MED ORDER — MIDAZOLAM HCL 2 MG/2ML IJ SOLN
INTRAMUSCULAR | Status: AC
  Filled 2018-04-26: qty 2

## 2018-04-26 MED ORDER — PHENYLEPHRINE 40 MCG/ML (10ML) SYRINGE FOR IV PUSH (FOR BLOOD PRESSURE SUPPORT)
PREFILLED_SYRINGE | INTRAVENOUS | Status: AC
  Filled 2018-04-26: qty 10

## 2018-04-26 MED ORDER — 0.9 % SODIUM CHLORIDE (POUR BTL) OPTIME
TOPICAL | Status: DC | PRN
  Administered 2018-04-26: 1000 mL

## 2018-04-26 MED ORDER — ENOXAPARIN SODIUM 40 MG/0.4ML ~~LOC~~ SOLN: 40.0000 mg | SUBCUTANEOUS | Status: DC

## 2018-04-26 MED ORDER — HYDROMORPHONE HCL 1 MG/ML IJ SOLN
0.2500 mg | INTRAMUSCULAR | Status: DC | PRN
  Administered 2018-04-26 (×2): 0.5 mg via INTRAVENOUS

## 2018-04-26 MED ORDER — ATORVASTATIN CALCIUM 20 MG PO TABS
10.0000 mg | ORAL_TABLET | Freq: Every day | ORAL | Status: DC
  Administered 2018-04-26 – 2018-04-28 (×3): 10 mg via ORAL
  Filled 2018-04-26 (×3): qty 1

## 2018-04-26 MED ORDER — HYDROCODONE-ACETAMINOPHEN 5-325 MG PO TABS
1.0000 | ORAL_TABLET | Freq: Once | ORAL | Status: AC
  Administered 2018-04-26: 1 via ORAL
  Filled 2018-04-26: qty 1

## 2018-04-26 SURGICAL SUPPLY — 54 items
BANDAGE ACE 4X5 VEL STRL LF (GAUZE/BANDAGES/DRESSINGS) ×2 IMPLANT
BANDAGE ACE 6X5 VEL STRL LF (GAUZE/BANDAGES/DRESSINGS) ×2 IMPLANT
BIT DRILL 3.8X6 NS (BIT) ×1 IMPLANT
BIT DRILL 4.4 NS (BIT) ×1 IMPLANT
BLADE SURG 10 STRL SS (BLADE) ×2 IMPLANT
BNDG GAUZE ELAST 4 BULKY (GAUZE/BANDAGES/DRESSINGS) ×2 IMPLANT
BRUSH SCRUB SURG 4.25 DISP (MISCELLANEOUS) ×4 IMPLANT
COVER SURGICAL LIGHT HANDLE (MISCELLANEOUS) ×4 IMPLANT
DRAPE C-ARM 42X72 X-RAY (DRAPES) ×2 IMPLANT
DRAPE C-ARMOR (DRAPES) ×2 IMPLANT
DRAPE INCISE IOBAN 66X45 STRL (DRAPES) IMPLANT
DRAPE U-SHAPE 47X51 STRL (DRAPES) ×2 IMPLANT
DRSG ADAPTIC 3X8 NADH LF (GAUZE/BANDAGES/DRESSINGS) ×2 IMPLANT
DRSG MEPITEL 4X7.2 (GAUZE/BANDAGES/DRESSINGS) ×2 IMPLANT
ELECT REM PT RETURN 9FT ADLT (ELECTROSURGICAL) ×2
ELECTRODE REM PT RTRN 9FT ADLT (ELECTROSURGICAL) ×1 IMPLANT
GAUZE SPONGE 4X4 12PLY STRL (GAUZE/BANDAGES/DRESSINGS) ×2 IMPLANT
GAUZE SPONGE 4X4 12PLY STRL LF (GAUZE/BANDAGES/DRESSINGS) ×1 IMPLANT
GLOVE BIO SURGEON STRL SZ7.5 (GLOVE) ×2 IMPLANT
GLOVE BIO SURGEON STRL SZ8 (GLOVE) ×2 IMPLANT
GLOVE BIO SURGEON STRL SZ8.5 (GLOVE) ×2 IMPLANT
GLOVE BIOGEL PI IND STRL 7.5 (GLOVE) ×1 IMPLANT
GLOVE BIOGEL PI IND STRL 8 (GLOVE) ×1 IMPLANT
GLOVE BIOGEL PI INDICATOR 7.5 (GLOVE) ×1
GLOVE BIOGEL PI INDICATOR 8 (GLOVE) ×1
GOWN STRL REUS W/ TWL LRG LVL3 (GOWN DISPOSABLE) ×2 IMPLANT
GOWN STRL REUS W/ TWL XL LVL3 (GOWN DISPOSABLE) ×1 IMPLANT
GOWN STRL REUS W/TWL LRG LVL3 (GOWN DISPOSABLE) ×4
GOWN STRL REUS W/TWL XL LVL3 (GOWN DISPOSABLE) ×2
GUIDEWIRE BALL NOSE 80CM (WIRE) ×1 IMPLANT
KIT BASIN OR (CUSTOM PROCEDURE TRAY) ×2 IMPLANT
KIT TURNOVER KIT B (KITS) ×2 IMPLANT
NAIL TIBIAL 10MMX31.5CM (Nail) ×1 IMPLANT
PACK ORTHO EXTREMITY (CUSTOM PROCEDURE TRAY) ×2 IMPLANT
PAD ABD 8X10 STRL (GAUZE/BANDAGES/DRESSINGS) ×3 IMPLANT
PAD ARMBOARD 7.5X6 YLW CONV (MISCELLANEOUS) ×4 IMPLANT
PAD CAST 4YDX4 CTTN HI CHSV (CAST SUPPLIES) IMPLANT
PADDING CAST COTTON 4X4 STRL (CAST SUPPLIES) ×2
PADDING CAST COTTON 6X4 STRL (CAST SUPPLIES) ×1 IMPLANT
SCREW ACECAP 34MM (Screw) ×1 IMPLANT
SCREW ACECAP 42MM (Screw) ×1 IMPLANT
SCREW PROXIMAL DEPUY (Screw) ×4 IMPLANT
SCREW PRXML FT 45X5.5XLCK NS (Screw) IMPLANT
SCREW PRXML FT 60X5.5XNS LF (Screw) IMPLANT
SPONGE LAP 18X18 X RAY DECT (DISPOSABLE) ×1 IMPLANT
STAPLER VISISTAT 35W (STAPLE) ×2 IMPLANT
SUT ETHILON 3 0 PS 1 (SUTURE) ×4 IMPLANT
SUT VIC AB 0 CT1 27 (SUTURE) ×2
SUT VIC AB 0 CT1 27XBRD ANBCTR (SUTURE) IMPLANT
SUT VIC AB 2-0 CT1 27 (SUTURE) ×2
SUT VIC AB 2-0 CT1 TAPERPNT 27 (SUTURE) ×1 IMPLANT
TOWEL OR 17X24 6PK STRL BLUE (TOWEL DISPOSABLE) ×2 IMPLANT
TOWEL OR 17X26 10 PK STRL BLUE (TOWEL DISPOSABLE) ×4 IMPLANT
YANKAUER SUCT BULB TIP NO VENT (SUCTIONS) ×1 IMPLANT

## 2018-04-26 NOTE — ED Notes (Signed)
Changed pt into gown. Changed soiled linens and adult pad.

## 2018-04-26 NOTE — Op Note (Signed)
04/26/2018  6:35 PM  PATIENT:  Stacy Diaz  67 y.o. female  PRE-OPERATIVE DIAGNOSIS:  Right comminuted tibial shaft fracture  POST-OPERATIVE DIAGNOSIS:  Right comminuted tibial shaft fracture  PROCEDURE:  Procedure(s): INTRAMEDULLARY (IM) NAIL TIBIAL (Right) right tibia with Biomet Versanail 10 x 315 mm statically locked  SURGEON:  Surgeon(s) and Role:    Altamese Montpelier, MD - Primary  PHYSICIAN ASSISTANT: Ainsley Spinner, PA-C  ANESTHESIA:   general  EBL:  25 mL   BLOOD ADMINISTERED:none  DRAINS: none   LOCAL MEDICATIONS USED:  NONE  SPECIMEN:  No Specimen  DISPOSITION OF SPECIMEN:  N/A  COUNTS:  YES  TOURNIQUET:  * No tourniquets in log *  DICTATION: .Note written in EPIC  PLAN OF CARE: Admit to inpatient   PATIENT DISPOSITION:  PACU - hemodynamically stable.   Delay start of Pharmacological VTE agent (>24hrs) due to surgical blood loss or risk of bleeding: no  BRIEF SUMMARY AND INDICATION FOR PROCEDURE:   Stacy Diaz is a 67 y.o.-year-old who sustained twisting injury to the right tibia while using her motorized wheelchair. 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 I discussed with the patient the risks and benefits of surgical reconstruction including the possibility of infection, nerve injury, vessel injury, malunion, nonunion, DVT, PE, heart attack, stroke, loss of motion, symptomatic hardware, need for further surgery, and others. The patient acknowledged these risks and wished to proceed.   BRIEF SUMMARY OF PROCEDURE:  After administration of preoperative antibiotic, the patient was taken to the operating room.  Following a chlorhexidine wash, the right lower extremity was prepped with betadine scrub and paint and then draped in usual sterile fashion.  No tourniquet was used during the procedure.  The knee was bent up to 90 degrees on a radiolucent triangle and a 2.5 cm incision was made extending proximally from the distal pole of the  patella.  In the deep tissues a medial parapatellar retinacular incision was made and then the curved cannulated awl inserted anterior to the edge of the articular surface and just medial to the lateral tibial spine. It was advance into the center of the proximal tibia.  A ball-tipped guidewire was bent and then advanced into the proximal tibia and then across the fracture site, watching on orthogonal views until it was centered in the distal plafond. While maintaining fracture reduction, the tibia was then reamed sequentially.  We encountered chatter at 10 mm, reamed to 11 mm, and placed 10 x 315 mm, statically locked nail. We did use the proximal locks off the jig and perfect circle technique for the distal locking bolts. All screws were checked for position within the nail and length.  Ainsley Spinner, PA-C, assisted me throughout, and his assistance was necessary as I held the reduction while he reamed and placed the nail.  Wounds irrigated thoroughly, closed in standard layered fashion using 0 Vicryl, 2-0 Vicryl, 3-0 nylon.  Sterile gently compressive dressing and splint were applied with the patient's ankle in maximal extension given her prior contracture and increased flexor tone.  The patient was then taken to the PACU in stable condition.   PROGNOSIS:  Patient will be nonweightbearing on the right lower extremity with unrestricted range of motion of the knee.  Will anticipate removing the sutures, re-evaluate in 14 days, at which time, will likely be transitioned from splint into a CAM boot to allow for unrestricted range of motion of the ankle as well.  Full weightbearing will  be anticipated to resume at 6 weeks, and we have discussed earlier WB just for transfers.  She will continue Plavix. Rehab consultation is anticipated at this time.  Altamese Riverside, MD [facility] [phone] (p)

## 2018-04-26 NOTE — Telephone Encounter (Signed)
Patient currently at [facility] ED 

## 2018-04-26 NOTE — ED Notes (Signed)
Paged ortho tech to apply short leg splint

## 2018-04-26 NOTE — ED Notes (Signed)
Admitting MD at bedside- then pt will be transported to short stay

## 2018-04-26 NOTE — Plan of Care (Signed)
  Problem: Education: Goal: Knowledge of General Education information will improve Description: Including pain rating scale, medication(s)/side effects and non-pharmacologic comfort measures Outcome: Progressing   Problem: Health Behavior/Discharge Planning: Goal: Ability to manage health-related needs will improve Outcome: Progressing   Problem: Clinical Measurements: Goal: Respiratory complications will improve Outcome: Progressing   Problem: Nutrition: Goal: Adequate nutrition will be maintained Outcome: Progressing   Problem: Coping: Goal: Level of anxiety will decrease Outcome: Progressing   

## 2018-04-26 NOTE — Telephone Encounter (Signed)
Please Advise

## 2018-04-26 NOTE — Consult Note (Signed)
Reason for Consult:Right tibia fx Referring Physician: Manuela Diaz is an 67 y.o. female.  HPI: Stacy Diaz was using her motorized WC at home and caught her toe on a cabinet and twisted her foot outward. Before she could stop she heard a crack and had pain. She came to the ED for evaluation and x-rays confirmed a right tib/fib fx and orthopedic surgery was consulted. She is paraplegic from a brain tumor but has normal sensation. She and her husband do not think it would be possible for her to transfer using just one leg.  Past Medical History:  Diagnosis Date  . Anxiety   . Arthritis    "probably" (08/25/2012)  . Breast cancer of lower-outer quadrant of left female breast ([place]) 05/25/2014  . Headache(784.0)    "not real frequent" (08/25/2012)  . Heart disease   . HTN (hypertension)   . Hyperlipidemia   . Meningioma ([place]) 1999  . Myocardial infarction ([place]) 1987  . 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 S/P radiation therapy 07/30/14-08/30/13   left breast cancer/50Gy  . Seizures ([place])    last seizure 1 month ago  . Spastic paraparesis    S/P meningioma resection 1999/notes 08/25/2012  . Stroke [facility]) 08/25/2012   "that's what they think I've had; sudden LUE weakness" (08/25/2012)    Past Surgical History:  Procedure Laterality Date  . BRAIN MENINGIOMA EXCISION  1999  . BREAST LUMPECTOMY WITH NEEDLE LOCALIZATION AND AXILLARY SENTINEL LYMPH NODE BX Left 06/22/2014   Procedure: LEFT WIRE LOCALIZED LUMPECTOPMY AND SENTINEL NODE MAPPING;  Surgeon: Autumn Messing III, MD;  Location: [facility];  Service: General;  Laterality: Left;  . CARDIAC CATHETERIZATION  1987  . CORONARY ANGIOPLASTY  1987   denied intervention: "blockage wasn't bad enough"    Family History  Problem Relation Age of Onset  . Breast cancer Mother 30       unilateral  . Heart disease Father   . Breast cancer Paternal Grandmother 42       unilateral    Social History:  reports that she has quit  smoking. Her smoking use included cigarettes. She has a 20.00 pack-year smoking history. She has never used smokeless tobacco. She reports that she does not drink alcohol or use drugs.  Allergies: No Known Allergies  Medications: I have reviewed the patient's current medications.  No results found for this or any previous visit (from the past 48 hour(s)).  Dg Tibia/fibula Right  Result Date: 04/26/2018 CLINICAL DATA:  Twisting injury of the right lower leg while riding in a wheelchair yesterday with onset of pain. Initial encounter. EXAM: RIGHT TIBIA AND FIBULA - 2 VIEW COMPARISON:  None. FINDINGS: The patient has a mildly comminuted fracture through the mid diaphysis of the tibia. The distal fragment shows slight anterior displacement. There is also a nondisplaced fracture through the proximal diaphysis of the fibula. No other acute abnormality is identified. Bones are osteopenic. Soft tissues about the lower leg appear swollen. IMPRESSION: Mildly comminuted and slightly displaced diaphyseal fracture of the right tibia. Nondisplaced diaphyseal fracture of the right fibula is also noted. Osteopenia. Electronically Signed   By: Inge Rise M.D.   On: 04/26/2018 10:26    Review of Systems  Constitutional: Negative for weight loss.  HENT: Negative for ear discharge, ear pain, hearing loss and tinnitus.   Eyes: Negative for blurred vision, double vision, photophobia and pain.  Respiratory: Negative for cough, sputum production and shortness  of breath.   Cardiovascular: Negative for chest pain.  Gastrointestinal: Negative for abdominal pain, nausea and vomiting.  Genitourinary: Negative for dysuria, flank pain, frequency and urgency.  Musculoskeletal: Positive for joint pain (Right tibia). Negative for back pain, falls, myalgias and neck pain.  Neurological: Negative for dizziness, tingling, sensory change, focal weakness, loss of consciousness and headaches.  Endo/Heme/Allergies: Does not  bruise/bleed easily.  Psychiatric/Behavioral: Negative for depression, memory loss and substance abuse. The patient is not nervous/anxious.    Blood pressure (!) 114/56, pulse 60, temperature 98.6 F (37 C), temperature source Oral, resp. rate 18, height 5\' 2"  (1.575 m), weight 68 kg, SpO2 96 %. Physical Exam  Constitutional: She appears well-developed and well-nourished. No distress.  HENT:  Head: Normocephalic and atraumatic.  Eyes: Conjunctivae are normal. Right eye exhibits no discharge. Left eye exhibits no discharge. No scleral icterus.  Neck: Normal range of motion.  Cardiovascular: Normal rate and regular rhythm.  Respiratory: Effort normal. No respiratory distress.  Musculoskeletal:  LLE No traumatic wounds, ecchymosis, or rash  TTP lower leg  No knee or ankle effusion  Sens DPN, SPN, TN intact  Motor EHL, ext, flex, evers 0/5  DP 2+, PT 1+, No significant edema  Neurological: She is alert.  Skin: Skin is warm and dry. She is not diaphoretic.  Psychiatric: She has a normal mood and affect. Her behavior is normal.    Assessment/Plan: Right tibia fx -- Plan for IMN this afternoon by Dr. Marcelino Scot. NPO until then. Seizure disorder HTN HLD    Lisette Abu, PA-C Orthopedic Surgery [phone] 04/26/2018, 11:56 AM

## 2018-04-26 NOTE — Anesthesia Preprocedure Evaluation (Signed)
Anesthesia Evaluation  Patient identified by MRN, date of birth, ID band Patient awake    Reviewed: Allergy & Precautions, NPO status , Patient's Chart, lab work & pertinent test results  Airway Mallampati: II  TM Distance: >3 FB Neck ROM: Full    Dental no notable dental hx.    Pulmonary neg pulmonary ROS, former smoker,    Pulmonary exam normal breath sounds clear to auscultation       Cardiovascular hypertension, + Past MI  negative cardio ROS Normal cardiovascular exam Rhythm:Regular Rate:Normal  Echo 06/04/16 Left ventricle: The cavity size was normal. Wall thickness was   increased in a pattern of mild LVH. Systolic function was normal.   The estimated ejection fraction was in the range of 50% to 55%.   Features are consistent with a pseudonormal left ventricular   filling pattern, with concomitant abnormal relaxation and   increased filling pressure (grade 2 diastolic dysfunction).   Neuro/Psych  Headaches, Seizures -,  Anxiety CVA    GI/Hepatic negative GI ROS,   Endo/Other    Renal/GU      Musculoskeletal  (+) Arthritis , She does have a history of chronic paresis of her lower extremities and uses a wheelchair for mobility.     Abdominal (+) scaphoid   Peds  Hematology   Anesthesia Other Findings   Reproductive/Obstetrics                            Anesthesia Physical Anesthesia Plan  ASA: III  Anesthesia Plan: General   Post-op Pain Management:    Induction: Intravenous  PONV Risk Score and Plan: 3 and Treatment may vary due to age or medical condition, Ondansetron and Dexamethasone  Airway Management Planned: Oral ETT  Additional Equipment:   Intra-op Plan:   Post-operative Plan: Extubation in OR  Informed Consent: I have reviewed the patients History and Physical, chart, labs and discussed the procedure including the risks, benefits and alternatives for the  proposed anesthesia with the patient or authorized representative who has indicated his/her understanding and acceptance.   Dental advisory given  Plan Discussed with: CRNA  Anesthesia Plan Comments:         Anesthesia Quick Evaluation

## 2018-04-26 NOTE — Anesthesia Procedure Notes (Signed)
Procedure Name: Intubation Date/Time: 04/26/2018 2:19 PM Performed by: Mariea Clonts, CRNA Pre-anesthesia Checklist: Patient identified, Emergency Drugs available, Suction available and Patient being monitored Patient Re-evaluated:Patient Re-evaluated prior to induction Oxygen Delivery Method: Circle System Utilized Preoxygenation: Pre-oxygenation with 100% oxygen Induction Type: IV induction Ventilation: Mask ventilation without difficulty Laryngoscope Size: Mac and 3 Grade View: Grade II Tube type: Oral Tube size: 7.0 mm Number of attempts: 1 Airway Equipment and Method: Stylet and Oral airway Placement Confirmation: ETT inserted through vocal cords under direct vision,  positive ETCO2 and breath sounds checked- equal and bilateral Tube secured with: Tape Dental Injury: Teeth and Oropharynx as per pre-operative assessment

## 2018-04-26 NOTE — Progress Notes (Signed)
Pt arrived to room 5N12 after surgery. Received report from [place], [place] in PACU. See assessment. Will continue to monitor.

## 2018-04-26 NOTE — ED Notes (Signed)
Pt transported to xray 

## 2018-04-26 NOTE — Telephone Encounter (Signed)
Husband reports pt. Was in her power wheelchair around the home and right lower leg was injured - somehow "caught on something or twisted" per husband, who is primary caregiver.Pt. Is non-ambulatory - only transfers.She slid out of the wheelchair to the floor yesterday, when this happened, and husband had to use the [facility] lift to get her in her hospital bed. No bruising or open skin area.States lower leg "looks a little swollen". Husband reports she has severe pain, but "her pain threshold is low." Unable to transfer out of bed this morning due to pain. Instructed to have pt. Evaluated at ED. Husband verbalizes understanding.  Reason for Disposition . Sounds like a serious injury to the triager  Answer Assessment - Initial Assessment Questions 1. MECHANISM: "How did the injury happen?" (e.g., twisting injury, direct blow)      Was in power wheelchair and foot was twisted as she was moving through the home 2. ONSET: "When did the injury happen?" (Minutes or hours ago)      This evening 3. LOCATION: "Where is the injury located?"      Right shin 4. APPEARANCE of INJURY: "What does the injury look like?"  (e.g., deformity of leg)     No bruising and some swelling 5. SEVERITY: "Can you put weight on that leg?" "Can you walk?"      Pt. Normally just transfers to a wheelchair 6. SIZE: For cuts, bruises, or swelling, ask: "How large is it?" (e.g., inches or centimeters)      None 7. PAIN: "Is there pain?" If so, ask: "How bad is the pain?"  (Scale 1-10; or mild, moderate, severe)     15 8. TETANUS: For any breaks in the skin, ask: "When was the last tetanus booster?"     N/A 9. OTHER SYMPTOMS: "Do you have any other symptoms?"      Pain 10. PREGNANCY: "Is there any chance you are pregnant?" "When was your last menstrual period?"       n/a  Protocols used: LEG INJURY-A-AH

## 2018-04-26 NOTE — H&P (Signed)
History and Physical    Stacy Diaz VCB:[id number] DOB: 08/12/1951 DOA: 04/26/2018  PCP: Dorena Cookey, MD Consultants:  Leonie Man - neurology; Lindi Adie - oncology Patient coming from:  Home - lives with husband; NOK: Husband, [phone]  Chief Complaint: Leg injury  HPI: Stacy Diaz is a 67 y.o. 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   She uses a motorized wheelchair and came around a corner and caught the toe of her right shoe on something.  It twisted her foot and she heard a snap in her leg.  It happened yesterday about 5pm.  Her husband told her she didn't need to come to the hospital.  She continued to have pain and eventually he brought her in for evaluation.  She does not have sensory loss in her lower extremities, but she has no spontaneous movement from the waist down.  She is able to bear weight on her legs.   ED Course:  Labs pending.  Clearly mechanical injury - foot caught in wheelchair and twisted her leg.  She does bear weight and transfer and so needs surgical repair.    Review of Systems: As per HPI; otherwise review of systems reviewed and negative.   Ambulatory Status: Uses a wheelchair, but is able to bear weight and transfer  Past Medical History:  Diagnosis Date  . Anxiety   . Arthritis    "probably" (08/25/2012)  . Breast cancer of lower-outer quadrant of left female breast ([place]) 05/25/2014  . Headache(784.0)    "not real frequent" (08/25/2012)  . Heart disease   . HTN (hypertension)   . Hyperlipidemia   . Meningioma ([place]) 1999  . Myocardial infarction ([place]) 1987  . 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 S/P radiation therapy 07/30/14-08/30/13   left breast cancer/50Gy  . Seizures ([place])    no seizures since 2014  . Spastic paraparesis    S/P meningioma  resection 1999/notes 08/25/2012  . Stroke ([place])    x 2, the last 2014    Past Surgical History:  Procedure Laterality Date  . BRAIN MENINGIOMA EXCISION  1999  . BREAST LUMPECTOMY WITH NEEDLE LOCALIZATION AND AXILLARY SENTINEL LYMPH NODE BX Left 06/22/2014   Procedure: LEFT WIRE LOCALIZED LUMPECTOPMY AND SENTINEL NODE MAPPING;  Surgeon: Autumn Messing III, MD;  Location: [facility];  Service: General;  Laterality: Left;  . CARDIAC CATHETERIZATION  1987  . CORONARY ANGIOPLASTY  1987   denied intervention: "blockage wasn't bad enough"    Social History   Socioeconomic History  . Marital status: Married    Spouse name: david  . Number of children: 2  . Years of education: college  . Highest education level: Not on file  Occupational History  . Occupation: disabled  Social Needs  . Financial resource strain: Not on file  . Food insecurity:    Worry: Not on file    Inability: Not on file  . Transportation needs:    Medical: Not on file    Non-medical: Not on file  Tobacco Use  . Smoking status: Former Smoker    Packs/day: 2.00    Years: 10.00    Pack years: 20.00    Types: Cigarettes  . Smokeless tobacco: Never Used  . Tobacco comment: 08/25/2012 "quit smoking cigarettes in 1985 when I had my heart attack"  Substance and Sexual  Activity  . Alcohol use: No    Alcohol/week: 7.0 standard drinks    Types: 7 Glasses of wine per week    Comment: occasionally  . Drug use: No  . Sexual activity: Never  Lifestyle  . Physical activity:    Days per week: Not on file    Minutes per session: Not on file  . Stress: Not on file  Relationships  . Social connections:    Talks on phone: Not on file    Gets together: Not on file    Attends religious service: Not on file    Active member of club or organization: Not on file    Attends meetings of clubs or organizations: Not on file    Relationship status: Not on file  . Intimate partner violence:    Fear of current or ex partner: Not on file     Emotionally abused: Not on file    Physically abused: Not on file    Forced sexual activity: Not on file  Other Topics Concern  . Not on file  Social History Narrative  . Not on file    No Known Allergies  Family History  Problem Relation Age of Onset  . Breast cancer Mother 70       unilateral  . Heart disease Father   . Breast cancer Paternal Grandmother 5       unilateral    Prior to Admission medications   Medication Sig Start Date End Date Taking? Authorizing Provider  atenolol (TENORMIN) 50 MG tablet 1-1/2 tablets twice daily 12/06/17   Dorena Cookey, MD  atorvastatin (LIPITOR) 10 MG tablet TAKE 1 TABLET BY MOUTH EVERY DAY 05/31/17   Dorena Cookey, MD  baclofen (LIORESAL) 20 MG tablet TAKE 1 TABLET (20 MG TOTAL) BY MOUTH 4 (FOUR) TIMES DAILY. 04/27/17   Dorena Cookey, MD  clopidogrel (PLAVIX) 75 MG tablet TAKE 1 TABLET BY MOUTH EVERY DAY WITH BREAKFAST 06/10/17   Dorena Cookey, MD  diazepam (VALIUM) 5 MG tablet Take 1 tablet (5 mg total) by mouth at bedtime. 10/27/17   Dorena Cookey, MD  letrozole [facility]) 2.5 MG tablet Take 1 tablet (2.5 mg total) by mouth daily. 03/23/18   Nicholas Lose, MD  lisinopril (PRINIVIL,ZESTRIL) 10 MG tablet 1 by mouth daily at bedtime 12/06/17   Dorena Cookey, MD  lisinopril (PRINIVIL,ZESTRIL) 20 MG tablet 1 by mouth twice a day 12/06/17   Dorena Cookey, MD  methocarbamol (ROBAXIN) 750 MG tablet Take 1 tablet (750 mg total) by mouth 2 (two) times daily. 10/26/17   Dorena Cookey, MD  Multiple Vitamin (MULTIVITAMIN WITH MINERALS) TABS tablet Take 1 tablet by mouth every morning.     [provider]  OVER THE COUNTER MEDICATION Apply 1 application topically daily as needed (pain). Aspercreme with 5% lidocaine    [provider]  Oxcarbazepine (TRILEPTAL) 300 MG tablet TAKE 1 TABLET (300 MG TOTAL) BY MOUTH 2 (TWO) TIMES DAILY. 01/06/18   Garvin Fila, MD  oxyCODONE-acetaminophen (PERCOCET) 10-325 MG tablet Take 1 tablet by  mouth 3 (three) times daily as needed for pain. 01/11/18   Dorena Cookey, MD  polyethylene glycol [facility] Floria Raveling) packet Take 17 g by mouth daily as needed (for constipation). Mix with liquid and drink    [provider]  senna (SENOKOT) 8.6 MG TABS tablet Take 1 tablet by mouth.    [provider]  zinc oxide (BALMEX) 11.3 % CREA  cream Apply 1 application topically at bedtime as needed (IRRITATION).     [provider]    Physical Exam: Vitals:   04/26/18 9767 04/26/18 0945 04/26/18 0955 04/26/18 1337  BP: (!) 114/56     Pulse: 60     Resp: 18     Temp: 98.6 F (37 C)     TempSrc: Oral     SpO2: 97% 96%    Weight:   68 kg 68 kg  Height:   5\' 2"  (1.575 m) 5' 2.01" (1.575 m)     General:  Appears calm and comfortable and is NAD; alopecia noted Eyes:  EOMI, normal lids, iris ENT:  grossly normal hearing, lips & tongue, mmm; appropriate dentition Neck:  no LAD, masses or thyromegaly Cardiovascular:  RRR, no m/r/g. No LE edema.  Respiratory:   CTA bilaterally with no wheezes/rales/rhonchi.  Normal respiratory effort. Abdomen:  soft, NT, ND, NABS Skin:  no rash or induration seen on limited exam Musculoskeletal: B decreased LE tone; RLE is splinted, delayed but present capillary refill in the toe Lower extremity:  No LE edema.  Limited foot exam with no ulcerations.  2+ distal pulses. Psychiatric:  Great disposition, grossly normal mood and affect, speech fluent and appropriate, AOx3 Neurologic:  CN 2-12 grossly intact, moves upper extremities in coordinated fashion, sensation intact    Radiological Exams on Admission: Dg Tibia/fibula Right  Result Date: 04/26/2018 CLINICAL DATA:  Twisting injury of the right lower leg while riding in a wheelchair yesterday with onset of pain. Initial encounter. EXAM: RIGHT TIBIA AND FIBULA - 2 VIEW COMPARISON:  None. FINDINGS: The patient has a mildly comminuted fracture through the mid diaphysis of the tibia. The  distal fragment shows slight anterior displacement. There is also a nondisplaced fracture through the proximal diaphysis of the fibula. No other acute abnormality is identified. Bones are osteopenic. Soft tissues about the lower leg appear swollen. IMPRESSION: Mildly comminuted and slightly displaced diaphyseal fracture of the right tibia. Nondisplaced diaphyseal fracture of the right fibula is also noted. Osteopenia. Electronically Signed   By: Inge Rise M.D.   On: 04/26/2018 10:26    EKG: Independently reviewed.  NSR with rate 61; nonspecific ST changes with no evidence of acute ischemia   Labs on Admission: I have personally reviewed the available labs and imaging studies at the time of the admission.  Pertinent labs:   Na++ 131, stable K+ 5.3 CO2 18 Glucose 108 BMP otherwise normal Normal CBC   Assessment/Plan Principal Problem:   Closed fracture of right tibia and fibula Active Problems:   Hyperlipidemia   Essential hypertension   Partial symptomatic epilepsy with complex partial seizures, not intractable, without status epilepticus ([place])   Breast cancer of lower-outer quadrant of left female breast ([place])   History of resection of meningioma   R tib-fib fracture -Mechanical fall resulting in right tib-fib fracture -Orthopedics consulted - going to the OR now -NPO until after surgical repair  -Start Lovenox post-operatively (or as per ortho) -Pain control with Robxain, home TID Percocet, and Morphine prn -SW consult for rehab placement -Will need PT consult post-operatively -Hip fracture order set utilized  H/o meningioma resection with B LE paralysis -B LE paralysis -She is able to bear weight and use a disk-device to assist with transfers -She will need to be able to weight bear in order to continue to transfer -She has suggested that she is likely to need short-term rehab placement after hospital d/c -Continue Baclofen  Seizure d/o -Continue Trileptal -She  reports that her last seizure was at the time of her last stroke, in 2014  Breast cancer -Continue Femara  HTN -She has an unusual BP regimen - she takes high-dose Atenolol BID and Lisinopril BID -Consider outpatient transition to Lopressor/Toprol XL and either once daily Lisinopril or an additional agent  HLD -Continue Lipitor   DVT prophylaxis:  Lovenox post-operatively Code Status:  Full - confirmed with patient Family Communication: None present  Disposition Plan:  Home once clinically improved Consults called: Orthopedics; SW, Nutrition; will need PT post-operatively  Admission status: Admit - It is my clinical opinion that admission to [facility] is reasonable and necessary because of the expectation that this patient will require hospital care that crosses at least 2 midnights to treat this condition based on the medical complexity of the problems presented.  Given the aforementioned information, the predictability of an adverse outcome is felt to be significant.   Karmen Bongo MD [facility]  If note is complete, please contact covering daytime or nighttime physician. [email or web address] Password [email or web address]  04/26/2018, 2:05 PM

## 2018-04-26 NOTE — Progress Notes (Signed)
Orthopedic Tech Progress Note Patient Details:  Stacy Diaz 1951-07-12 [id number]  Ortho Devices Type of Ortho Device: Ace wrap, Post (short leg) splint, Stirrup splint Ortho Device/Splint Location: rle Ortho Device/Splint Interventions: Application   Post Interventions Patient Tolerated: Well Instructions Provided: Care of device   Hildred Priest 04/26/2018, 12:23 PM

## 2018-04-26 NOTE — ED Triage Notes (Signed)
Pt arrived via [facility] from home with c/o right lower leg pain after "twisting" her leg last night when she got it stuck between her power wheel chair and a door frame. Pt reports "hearing a crack". Took home oxycodone at 730 today pain decreased from 9/10 to 5/10.

## 2018-04-26 NOTE — Telephone Encounter (Signed)
Monitor for ED arrival. 

## 2018-04-26 NOTE — ED Provider Notes (Signed)
[facility] EMERGENCY DEPARTMENT Provider Note   CSN: [id number] Arrival date & time: 04/26/18  8315     History   Chief Complaint Chief Complaint  Patient presents with  . Leg Injury    HPI Stacy Diaz is a 67 y.o. female.  HPI Patient has a history of multiple medical problems as indicated below.  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  Patient states she was using her wheelchair yesterday when her lower leg got caught and twisted.  This morning the patient noticed persistent swelling and bruising of her right anterior lower leg.  Normally she has some sensation in her lower legs but does not have any mobility.  Patient denies any pain in her knees or feet.  She denies any other injuries. Past Medical History:  Diagnosis Date  . Anxiety   . Arthritis    "probably" (08/25/2012)  . Breast cancer of lower-outer quadrant of left female breast ([place]) 05/25/2014  . Headache(784.0)    "not real frequent" (08/25/2012)  . Heart disease   . HTN (hypertension)   . Hyperlipidemia   . Meningioma ([place]) 1999  . Myocardial infarction ([place]) 1987  . 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 S/P radiation therapy 07/30/14-08/30/13   left breast cancer/50Gy  . Seizures ([place])    last seizure 1 month ago  . Spastic paraparesis    S/P meningioma resection 1999/notes 08/25/2012  . Stroke [facility]) 08/25/2012   "that's what they think I've had; sudden LUE weakness" (08/25/2012)    Patient Active Problem List   Diagnosis Date Noted  . Acute right-sided weakness 06/02/2016  . History of resection of meningioma 06/02/2016  . Family history of breast cancer 06/07/2014  . Breast cancer of lower-outer quadrant of left female breast ([place]) 05/25/2014  . Partial symptomatic epilepsy with complex partial seizures, not intractable, without status epilepticus ([place]) 04/29/2014  . History of stroke 04/29/2014    . Recent UTI (urinary tract infection) 08/26/2012  . Paralysis of upper limb ([place]) 08/25/2012  . BENIGN NEOPLASM OF BRAIN 03/31/2010  . INJURY, NERVE, PELVIS/LWR LIMB NOS 05/20/2007  . Hyperlipidemia 05/12/2007  . Essential hypertension 05/12/2007  . PREMATURE VENTRICULAR CONTRACTIONS 05/12/2007    Past Surgical History:  Procedure Laterality Date  . BRAIN MENINGIOMA EXCISION  1999  . BREAST LUMPECTOMY WITH NEEDLE LOCALIZATION AND AXILLARY SENTINEL LYMPH NODE BX Left 06/22/2014   Procedure: LEFT WIRE LOCALIZED LUMPECTOPMY AND SENTINEL NODE MAPPING;  Surgeon: Autumn Messing III, MD;  Location: [facility];  Service: General;  Laterality: Left;  . CARDIAC CATHETERIZATION  1987  . CORONARY ANGIOPLASTY  1987   denied intervention: "blockage wasn't bad enough"     OB History   None      Home Medications    Prior to Admission medications   Medication Sig Start Date End Date Taking? Authorizing Provider  atenolol (TENORMIN) 50 MG tablet 1-1/2 tablets twice daily 12/06/17   Dorena Cookey, MD  atorvastatin (LIPITOR) 10 MG tablet TAKE 1 TABLET BY MOUTH EVERY DAY 05/31/17   Dorena Cookey, MD  baclofen (LIORESAL) 20 MG tablet TAKE 1 TABLET (20 MG TOTAL) BY MOUTH 4 (FOUR) TIMES DAILY. 04/27/17   Dorena Cookey, MD  clopidogrel (PLAVIX) 75 MG tablet TAKE 1 TABLET BY MOUTH EVERY DAY WITH BREAKFAST 06/10/17   Dorena Cookey, MD  diazepam (VALIUM) 5 MG tablet Take 1 tablet (  5 mg total) by mouth at bedtime. 10/27/17   Dorena Cookey, MD  letrozole [facility]) 2.5 MG tablet Take 1 tablet (2.5 mg total) by mouth daily. 03/23/18   Nicholas Lose, MD  lisinopril (PRINIVIL,ZESTRIL) 10 MG tablet 1 by mouth daily at bedtime 12/06/17   Dorena Cookey, MD  lisinopril (PRINIVIL,ZESTRIL) 20 MG tablet 1 by mouth twice a day 12/06/17   Dorena Cookey, MD  methocarbamol (ROBAXIN) 750 MG tablet Take 1 tablet (750 mg total) by mouth 2 (two) times daily. 10/26/17   Dorena Cookey, MD  Multiple Vitamin (MULTIVITAMIN WITH  MINERALS) TABS tablet Take 1 tablet by mouth every morning.     [provider]  OVER THE COUNTER MEDICATION Apply 1 application topically daily as needed (pain). Aspercreme with 5% lidocaine    [provider]  Oxcarbazepine (TRILEPTAL) 300 MG tablet TAKE 1 TABLET (300 MG TOTAL) BY MOUTH 2 (TWO) TIMES DAILY. 01/06/18   Garvin Fila, MD  oxyCODONE-acetaminophen (PERCOCET) 10-325 MG tablet Take 1 tablet by mouth 3 (three) times daily as needed for pain. 01/11/18   Dorena Cookey, MD  polyethylene glycol [facility] / Floria Raveling) packet Take 17 g by mouth daily as needed (for constipation). Mix with liquid and drink    [provider]  senna (SENOKOT) 8.6 MG TABS tablet Take 1 tablet by mouth.    [provider]  zinc oxide (BALMEX) 11.3 % CREA cream Apply 1 application topically at bedtime as needed (IRRITATION).     [provider]    Family History Family History  Problem Relation Age of Onset  . Breast cancer Mother 94       unilateral  . Heart disease Father   . Breast cancer Paternal Grandmother 70       unilateral    Social History Social History   Tobacco Use  . Smoking status: Former Smoker    Packs/day: 2.00    Years: 10.00    Pack years: 20.00    Types: Cigarettes  . Smokeless tobacco: Never Used  . Tobacco comment: 08/25/2012 "quit smoking cigarettes in 1985 when I had my heart attack"  Substance Use Topics  . Alcohol use: No    Alcohol/week: 7.0 standard drinks    Types: 7 Glasses of wine per week    Comment: occasionally  . Drug use: No     Allergies   Patient has no known allergies.   Review of Systems Review of Systems  All other systems reviewed and are negative.    Physical Exam Updated Vital Signs BP (!) 114/56 (BP Location: Right Arm)   Pulse 60   Temp 98.6 F (37 C) (Oral)   Resp 18   Ht 1.575 m (5\' 2" )   Wt 68 kg   SpO2 96%   BMI 27.44 kg/m   Physical Exam  Constitutional: She appears  well-developed and well-nourished. No distress.  HENT:  Head: Normocephalic and atraumatic.  Right Ear: External ear normal.  Left Ear: External ear normal.  Eyes: Conjunctivae are normal. Right eye exhibits no discharge. Left eye exhibits no discharge. No scleral icterus.  Neck: Neck supple. No tracheal deviation present.  Cardiovascular: Normal rate.  Pulmonary/Chest: Effort normal. No stridor. No respiratory distress.  Abdominal: She exhibits no distension.  Musculoskeletal: She exhibits no edema.       Right lower leg: She exhibits tenderness and bony tenderness.       Legs: Edema and ttp anterior aspect right lower  leg  Neurological: She is alert. Cranial nerve deficit: no gross deficits.  Skin: Skin is warm and dry. No rash noted.  Psychiatric: She has a normal mood and affect.  Nursing note and vitals reviewed.    ED Treatments / Results  Labs (all labs ordered are listed, but only abnormal results are displayed) Labs Reviewed  BASIC METABOLIC PANEL - Abnormal; Notable for the following components:      Result Value   Sodium 131 (*)    Potassium 5.3 (*)    CO2 18 (*)    Glucose, Bld 108 (*)    All other components within normal limits  CBC    EKG EKG Interpretation  Date/Time:  Tuesday April 26 2018 12:55:44 EDT Ventricular Rate:  61 PR Interval:    QRS Duration: 101 QT Interval:  467 QTC Calculation: 471 R Axis:   14 Text Interpretation:  Age not entered, assumed to be  67 years old for purpose of ECG interpretation Sinus rhythm Low voltage, precordial leads Probable anteroseptal infarct, old Since last tracing rate slower Confirmed by Dorie Rank [phone]) on 04/26/2018 1:02:01 PM   Radiology Dg Tibia/fibula Right  Result Date: 04/26/2018 CLINICAL DATA:  Twisting injury of the right lower leg while riding in a wheelchair yesterday with onset of pain. Initial encounter. EXAM: RIGHT TIBIA AND FIBULA - 2 VIEW COMPARISON:  None. FINDINGS: The patient has a  mildly comminuted fracture through the mid diaphysis of the tibia. The distal fragment shows slight anterior displacement. There is also a nondisplaced fracture through the proximal diaphysis of the fibula. No other acute abnormality is identified. Bones are osteopenic. Soft tissues about the lower leg appear swollen. IMPRESSION: Mildly comminuted and slightly displaced diaphyseal fracture of the right tibia. Nondisplaced diaphyseal fracture of the right fibula is also noted. Osteopenia. Electronically Signed   By: Inge Rise M.D.   On: 04/26/2018 10:26    Procedures Procedures (including critical care time)  Medications Ordered in ED Medications  chlorhexidine (HIBICLENS) 4 % liquid 4 application (has no administration in time range)  povidone-iodine 10 % swab 2 application (has no administration in time range)  ceFAZolin (ANCEF) IVPB 2g/100 mL premix (has no administration in time range)  HYDROcodone-acetaminophen (NORCO/VICODIN) 5-325 MG per tablet 1 tablet (1 tablet Oral Given 04/26/18 1118)  ondansetron (ZOFRAN-ODT) disintegrating tablet 8 mg (8 mg Oral Given 04/26/18 1123)     Initial Impression / Assessment and Plan / ED Course  I have reviewed the triage vital signs and the nursing notes.  Pertinent labs & imaging results that were available during my care of the patient were reviewed by me and considered in my medical decision making (see chart for details).  Clinical Course as of Apr 26 1301  Tue Apr 26, 2018  1203 D/w Orion Crook.  Pt will need operative intervention.  Plan on admission, request medical service   [JK]    Clinical Course User Index [JK] Dorie Rank, MD   Patient presented to the emergency room for evaluation of a leg injury.  Patient's x-rays showed a comminuted and displaced diaphyseal fracture of the right tibia.  Patient also had a diaphyseal fracture of the right fibula.  Orthopedics was consulted.  Plan on operative intervention.  Appears medically  stable at this time.  I will consult the medical service.  Final Clinical Impressions(s) / ED Diagnoses   Final diagnoses:  Closed fracture of shaft of right tibia, unspecified fracture morphology, initial encounter  Dorie Rank, MD 04/26/18 1302

## 2018-04-26 NOTE — Transfer of Care (Signed)
Immediate Anesthesia Transfer of Care Note  Patient: Stacy Diaz  Procedure(s) Performed: INTRAMEDULLARY (IM) NAIL TIBIAL (Right )  Patient Location: PACU  Anesthesia Type:General  Level of Consciousness: awake, alert  and oriented  Airway & Oxygen Therapy: Patient Spontanous Breathing and Patient connected to nasal cannula oxygen  Post-op Assessment: Report given to RN and Post -op Vital signs reviewed and stable  Post vital signs: Reviewed and stable  Last Vitals:  Vitals Value Taken Time  BP 150/72 04/26/2018  4:09 PM  Temp    Pulse 75 04/26/2018  4:11 PM  Resp 19 04/26/2018  4:11 PM  SpO2 100 % 04/26/2018  4:11 PM  Vitals shown include unvalidated device data.  Last Pain:  Vitals:   04/26/18 1219  TempSrc:   PainSc: 4          Complications: No apparent anesthesia complications

## 2018-04-27 ENCOUNTER — Other Ambulatory Visit: Payer: Self-pay | Admitting: Family Medicine

## 2018-04-27 ENCOUNTER — Encounter (HOSPITAL_COMMUNITY): Payer: Self-pay | Admitting: Orthopedic Surgery

## 2018-04-27 ENCOUNTER — Telehealth: Payer: Self-pay | Admitting: Family Medicine

## 2018-04-27 DIAGNOSIS — I10 Essential (primary) hypertension: Secondary | ICD-10-CM

## 2018-04-27 DIAGNOSIS — Z8603 Personal history of neoplasm of uncertain behavior: Secondary | ICD-10-CM

## 2018-04-27 DIAGNOSIS — Z9889 Other specified postprocedural states: Secondary | ICD-10-CM

## 2018-04-27 DIAGNOSIS — S82201A Unspecified fracture of shaft of right tibia, initial encounter for closed fracture: Secondary | ICD-10-CM

## 2018-04-27 LAB — CBC
HCT: 36.4 % (ref 36.0–46.0)
Hemoglobin: 12.4 g/dL (ref 12.0–15.0)
MCH: 30.6 pg (ref 26.0–34.0)
MCHC: 34.1 g/dL (ref 30.0–36.0)
MCV: 89.9 fL (ref 78.0–100.0)
PLATELETS: 197 10*3/uL (ref 150–400)
RBC: 4.05 MIL/uL (ref 3.87–5.11)
RDW: 13 % (ref 11.5–15.5)
WBC: 14.6 10*3/uL — AB (ref 4.0–10.5)

## 2018-04-27 LAB — URINALYSIS, ROUTINE W REFLEX MICROSCOPIC
Bilirubin Urine: NEGATIVE
GLUCOSE, UA: NEGATIVE mg/dL
Ketones, ur: NEGATIVE mg/dL
Nitrite: NEGATIVE
PROTEIN: 30 mg/dL — AB
Specific Gravity, Urine: 1.023 (ref 1.005–1.030)
WBC, UA: >50 WBC/hpf — ABNORMAL HIGH (ref 0–5)
pH: 5 (ref 5.0–8.0)

## 2018-04-27 LAB — BASIC METABOLIC PANEL
Anion gap: 12 (ref 5–15)
BUN: 8 mg/dL (ref 8–23)
CO2: 21 mmol/L — ABNORMAL LOW (ref 22–32)
CREATININE: 0.59 mg/dL (ref 0.44–1.00)
Calcium: 8.8 mg/dL — ABNORMAL LOW (ref 8.9–10.3)
Chloride: 102 mmol/L (ref 98–111)
GFR calc Af Amer: >60 mL/min (ref >60)
Glucose, Bld: 113 mg/dL — ABNORMAL HIGH (ref 70–99)
Potassium: 4.3 mmol/L (ref 3.5–5.1)
SODIUM: 135 mmol/L (ref 135–145)

## 2018-04-27 MED ORDER — ACETAMINOPHEN 500 MG PO TABS
500.0000 mg | ORAL_TABLET | Freq: Three times a day (TID) | ORAL | Status: DC
  Administered 2018-04-27 – 2018-04-28 (×4): 500 mg via ORAL
  Filled 2018-04-27 (×4): qty 1

## 2018-04-27 MED ORDER — OXYCODONE-ACETAMINOPHEN 10-325 MG PO TABS: 1.0000 | ORAL_TABLET | Freq: Three times a day (TID) | ORAL | 0 refills | Status: DC | PRN

## 2018-04-27 MED ORDER — OXYCODONE HCL 5 MG PO TABS
5.0000 mg | ORAL_TABLET | ORAL | Status: DC | PRN
  Administered 2018-04-28 (×3): 5 mg via ORAL
  Filled 2018-04-27 (×3): qty 1
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
## 2018-04-27 NOTE — NC FL2 (Signed)
[place] MEDICAID FL2 LEVEL OF CARE SCREENING TOOL     IDENTIFICATION  Patient Name: Stacy Diaz Birthdate: August 20, 1950 Sex: female Admission Date (Current Location): 04/26/2018  [facility] and [place] Number:  Herbalist and Address:  [facility] [place] [address], [place], [place] [address]      Provider Number: [id number]  Attending Physician Name and Address:  Donne Hazel, MD  Relative Name and Phone Number:  Koby Hartfield (spouse) [phone]    Current Level of Care: Hospital Recommended Level of Care: [place] Prior Approval Number:    Date Approved/Denied:   PASRR Number: [id number]  Discharge Plan: SNF    Current Diagnoses: Patient Active Problem List   Diagnosis Date Noted  . Closed fracture of right tibia and fibula 04/26/2018  . Acute right-sided weakness 06/02/2016  . History of resection of meningioma 06/02/2016  . Family history of breast cancer 06/07/2014  . Breast cancer of lower-outer quadrant of left female breast ([place]) 05/25/2014  . Partial symptomatic epilepsy with complex partial seizures, not intractable, without status epilepticus ([place]) 04/29/2014  . History of stroke 04/29/2014  . Recent UTI (urinary tract infection) 08/26/2012  . Paralysis of upper limb ([place]) 08/25/2012  . BENIGN NEOPLASM OF BRAIN 03/31/2010  . INJURY, NERVE, PELVIS/LWR LIMB NOS 05/20/2007  . Hyperlipidemia 05/12/2007  . Essential hypertension 05/12/2007  . PREMATURE VENTRICULAR CONTRACTIONS 05/12/2007    Orientation RESPIRATION BLADDER Height & Weight     Self, Time, Situation, Place  O2(Nasal Canula 2L/m) Incontinent, External catheter Weight: 150 lb (68 kg) Height:  5' 2.01" (157.5 cm)  BEHAVIORAL SYMPTOMS/MOOD NEUROLOGICAL BOWEL NUTRITION STATUS      Continent Diet(See discharge summary)  AMBULATORY STATUS COMMUNICATION OF NEEDS Skin   Limited Assist   Surgical wounds(Right leg wound, left hip surgical  incision)                       Personal Care Assistance Level of Assistance  Bathing, Feeding, Dressing, Total care Bathing Assistance: Maximum assistance Feeding assistance: Limited assistance Dressing Assistance: Maximum assistance Total Care Assistance: Maximum assistance   Functional Limitations Info  Sight, Hearing, Speech Sight Info: Adequate Hearing Info: Adequate Speech Info: Adequate    SPECIAL CARE FACTORS FREQUENCY  PT (By licensed PT), OT (By licensed OT)     PT Frequency: 2x weekly OT Frequency: 2x weekly            Contractures Contractures Info: Not present    Additional Factors Info  Code Status, Allergies Code Status Info: Full Code Allergies Info: No Known           Current Medications (04/27/2018):  This is the current hospital active medication list Current Facility-Administered Medications  Medication Dose Route Frequency Provider Last Rate Last Dose  . acetaminophen (TYLENOL) tablet 500 mg  500 mg Oral Q8H Ainsley Spinner, PA-C   500 mg at 04/27/18 0934  . atenolol (TENORMIN) tablet 75 mg  75 mg Oral BID Ainsley Spinner, PA-C   75 mg at 04/27/18 2585  . atorvastatin (LIPITOR) tablet 10 mg  10 mg Oral q1800 Ainsley Spinner, PA-C   10 mg at 04/26/18 1843  . baclofen (LIORESAL) tablet 20 mg  20 mg Oral QID Ainsley Spinner, PA-C   20 mg at 04/27/18 1455  . clopidogrel (PLAVIX) tablet 75 mg  75 mg Oral Daily Ainsley Spinner, PA-C   75 mg at 04/27/18 0935  . diazepam (VALIUM) tablet 5  mg  5 mg Oral QHS Ainsley Spinner, PA-C   5 mg at 04/26/18 2225  . docusate sodium (COLACE) capsule 100 mg  100 mg Oral BID Ainsley Spinner, PA-C   100 mg at 04/27/18 0935  . enoxaparin (LOVENOX) injection 40 mg  40 mg Subcutaneous Q24H Ainsley Spinner, PA-C   40 mg at 04/27/18 8144  . lactated ringers infusion   Intravenous Continuous Ainsley Spinner, PA-C   Stopped at 04/27/18 [phone]  . letrozole [facility]) tablet 2.5 mg  2.5 mg Oral Daily Ainsley Spinner, PA-C   2.5 mg at 04/27/18 6314  . lisinopril  (PRINIVIL,ZESTRIL) tablet 10 mg  10 mg Oral QHS Ainsley Spinner, PA-C   10 mg at 04/26/18 2225  . lisinopril (PRINIVIL,ZESTRIL) tablet 20 mg  20 mg Oral Daily Ainsley Spinner, PA-C   20 mg at 04/27/18 0935  . methocarbamol (ROBAXIN) tablet 500 mg  500 mg Oral Q6H PRN Ainsley Spinner, PA-C   500 mg at 04/26/18 1844   Or  . methocarbamol (ROBAXIN) 500 mg in dextrose 5 % 50 mL IVPB  500 mg Intravenous Q6H PRN Ainsley Spinner, PA-C      . metoCLOPramide (REGLAN) tablet 5-10 mg  5-10 mg Oral Q8H PRN Ainsley Spinner, PA-C       Or  . metoCLOPramide (REGLAN) injection 5-10 mg  5-10 mg Intravenous Q8H PRN Ainsley Spinner, PA-C      . morphine 2 MG/ML injection 0.5 mg  0.5 mg Intravenous Q2H PRN Ainsley Spinner, PA-C   0.5 mg at 04/27/18 0556  . ondansetron (ZOFRAN) tablet 4 mg  4 mg Oral Q6H PRN Ainsley Spinner, PA-C       Or  . ondansetron [facility]) injection 4 mg  4 mg Intravenous Q6H PRN Ainsley Spinner, PA-C      . Oxcarbazepine (TRILEPTAL) tablet 300 mg  300 mg Oral BID Ainsley Spinner, PA-C   300 mg at 04/27/18 0935  . oxyCODONE-acetaminophen (PERCOCET/ROXICET) 5-325 MG per tablet 1 tablet  1 tablet Oral TID Karmen Bongo, MD   1 tablet at 04/27/18 1455   And  . oxyCODONE (Oxy IR/ROXICODONE) immediate release tablet 5 mg  5 mg Oral TID Karmen Bongo, MD   5 mg at 04/27/18 1455  . oxyCODONE (Oxy IR/ROXICODONE) immediate release tablet 5 mg  5 mg Oral Q3H PRN Ainsley Spinner, PA-C      . polyethylene glycol (MIRALAX / GLYCOLAX) packet 17 g  17 g Oral Daily PRN Ainsley Spinner, PA-C      . senna (SENOKOT) tablet 8.6 mg  1 tablet Oral Daily Ainsley Spinner, PA-C   8.6 mg at 04/27/18 0935  . zinc oxide (BALMEX) 97.0 % cream 1 application  1 application Topical QHS PRN Ainsley Spinner, PA-C         Discharge Medications: Please see discharge summary for a list of discharge medications.  Relevant Imaging Results:  Relevant Lab Results:   Additional Information SSN: [id number]  Alberteen Sam, LCSW

## 2018-04-27 NOTE — Progress Notes (Signed)
Nutrition Consult/Brief Note  RD consulted via hip fracture protocol.  Pt admitted with Closed fracture of shaft of right tibia, unspecified fracture morphology, initial encounter [S82.201A]  Wt Readings from Last 15 Encounters:  04/26/18 68 kg  01/11/18 65.8 kg  12/06/17 65.8 kg  04/27/17 65.8 kg  06/02/16 91.8 kg  07/29/15 81.6 kg  06/22/14 77.1 kg  06/14/14 77.1 kg  02/28/14 70.3 kg  07/11/13 77.1 kg  05/30/13 77.4 kg  09/04/12 74.1 kg  08/30/12 73.5 kg  04/02/11 68 kg   Body mass index is 27.43 kg/m. Patient meets criteria for Overweight based on current BMI.   Current diet order is Regular, patient is consuming approximately 85% of meals at this time. Labs and medications reviewed.   No nutrition interventions warranted at this time. If nutrition issues arise, please consult RD.   Arthur Holms, RD, LDN Pager #: [phone] After-Hours Pager #: [phone]

## 2018-04-27 NOTE — Progress Notes (Signed)
Physical Therapy Evaluation Patient Details Name: Stacy Diaz MRN: [id number] DOB: Mar 19, 1951 Today's Date: 04/27/2018   History of Present Illness  Stacy Diaz is a 67 y.o. 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 s/p IM nail, right tibia.  Clinical Impression  Patient is s/p above surgery resulting in deficits listed below (see PT Problem List). PTA, pt mobilized with use of a power wheel chair with assistance of a personal care attendant 5 days a week and equipment for transfers. At time of evaluation co treated with OT to maximize safety. Pt performed rolling with mod-max assist,utilizing maxi move to transfer pt to chair. Pt is appropriate to transfer with use of maxi move with staff for safety. Recommending SNF at d/c to decrease caregiver burden and maximize safety with mobility in order to adhere to precautions. Will continue to follow while admitted to increase safety with mobility to allow discharge to the venue listed below.       Follow Up Recommendations SNF;Supervision/Assistance - 24 hour    Equipment Recommendations  Other (comment)(TBD by next venue of care)    Recommendations for Other Services       Precautions / Restrictions Precautions Precautions: Fall Precaution Comments: Increased tone in LE's Restrictions Weight Bearing Restrictions: Yes RLE Weight Bearing: Non weight bearing      Mobility  Bed Mobility Overal bed mobility: Needs Assistance Bed Mobility: Rolling(Simultaneous filing. User may not have seen previous data.) Rolling: Mod assist;+2 for physical assistance;Max assist;+2 for safety/equipment         General bed mobility comments: Pt mod A-max A +2 for rolling with cues for hand placement. Max assist required in order to lift LE +1 while Mod A required to assist pt in  rolling in order to put maxi move lift pad under pt   Transfers Overall transfer level: Needs assistance               General transfer comment: Maximove utilized for transition to chair. +3 required for positioning, use of mechanical lift, and support of the RLE (held up with pillow during transfer).   Ambulation/Gait             General Gait Details: Unable at baseline   Stairs            Wheelchair Mobility    Modified Rankin (Stroke Patients Only)       Balance Overall balance assessment: Needs assistance     Sitting balance - Comments: Unable to assess this session however pt reports at baseline she has difficulty with sitting balance and requires significant assistance.                                      Pertinent Vitals/Pain Pain Assessment: Faces Faces Pain Scale: Hurts even more Pain Location: RLE Pain Descriptors / Indicators: Discomfort;Grimacing;Operative site guarding Pain Intervention(s): Limited activity within patient's tolerance;Monitored during session;Repositioned    Home Living Family/patient expects to be discharged to:: Skilled nursing facility Living Arrangements: Spouse/significant other Available Help at Discharge: Family;Personal care attendant;Available 24 hours/day Type of Home: House Home Access: Ramped entrance;Other (comment)(chair lift )     Home Layout: One level Home Equipment: Wheelchair - power;Shower seat Additional Comments: Platform lift to get in/out of the house. Car has a turn  seat in it. Disc transfer for inside the house.     Prior Function           Comments: Caregiver 5x/week for 4 hours a day. Caregiver does light housework and assists with ADL's     Hand Dominance   Dominant Hand: Right    Extremity/Trunk Assessment   Upper Extremity Assessment Upper Extremity Assessment: Defer to OT evaluation    Lower Extremity Assessment Lower Extremity Assessment: RLE  deficits/detail;LLE deficits/detail RLE Deficits / Details: s/p IM nail R tibia; No motor function at baseline in RLE RLE Sensation: WNL LLE Deficits / Details: No motor function in LLE at baseline LLE Sensation: WNL    Cervical / Trunk Assessment Cervical / Trunk Assessment: Other exceptions Cervical / Trunk Exceptions: unable to assess(Spouse reports decreased trunk control)  Communication   Communication: No difficulties  Cognition Arousal/Alertness: Awake/alert Behavior During Therapy: WFL for tasks assessed/performed Overall Cognitive Status: Within Functional Limits for tasks assessed                                        General Comments      Exercises     Assessment/Plan    PT Assessment Patient needs continued PT services  PT Problem List Decreased strength;Decreased range of motion;Decreased activity tolerance;Decreased balance;Decreased mobility;Decreased coordination;Decreased knowledge of use of DME;Decreased safety awareness;Decreased knowledge of precautions;Impaired tone;Pain       PT Treatment Interventions      PT Goals (Current goals can be found in the Care Plan section)  Acute Rehab PT Goals Patient Stated Goal: Go to rehab for more therapy at d/c PT Goal Formulation: With patient/family Time For Goal Achievement: 05/11/18 Potential to Achieve Goals: Good    Frequency     Barriers to discharge        Co-evaluation PT/OT/SLP Co-Evaluation/Treatment: Yes Reason for Co-Treatment: Complexity of the patient's impairments (multi-system involvement);For patient/therapist safety;To address functional/ADL transfers PT goals addressed during session: Mobility/safety with mobility;Proper use of DME;Strengthening/ROM         AM-PAC PT "6 Clicks" Daily Activity  Outcome Measure Difficulty turning over in bed (including adjusting bedclothes, sheets and blankets)?: Unable Difficulty moving from lying on back to sitting on the side of the  bed? : Unable Difficulty sitting down on and standing up from a chair with arms (e.g., wheelchair, bedside commode, etc,.)?: Unable Help needed moving to and from a bed to chair (including a wheelchair)?: Total Help needed walking in hospital room?: Total Help needed climbing 3-5 steps with a railing? : Total 6 Click Score: 6    End of Session Equipment Utilized During Treatment: Other (comment)(Lift equipment) Activity Tolerance: Patient tolerated treatment well Patient left: in chair;with call bell/phone within reach;with nursing/sitter in room;with family/visitor present Nurse Communication: Mobility status;Need for lift equipment;Precautions PT Visit Diagnosis: Pain;Muscle weakness (generalized) (M62.81);Other abnormalities of gait and mobility (R26.89) Pain - Right/Left: Right Pain - part of body: Leg    Time: 1353-1443 PT Time Calculation (min) (ACUTE ONLY): 50 min   Charges:   PT Evaluation $PT Eval Moderate Complexity: 1 Mod          Einar Crow, [place]  Student Physical Therapist Acute Rehab [phone]   Einar Crow 04/27/2018, 3:10 PM

## 2018-04-27 NOTE — Anesthesia Postprocedure Evaluation (Signed)
Anesthesia Post Note  Patient: Stacy Diaz  Procedure(s) Performed: INTRAMEDULLARY (IM) NAIL TIBIAL (Right )     Patient location during evaluation: PACU Anesthesia Type: General Level of consciousness: awake and alert Pain management: pain level controlled Vital Signs Assessment: post-procedure vital signs reviewed and stable Respiratory status: spontaneous breathing, nonlabored ventilation, respiratory function stable and patient connected to nasal cannula oxygen Cardiovascular status: blood pressure returned to baseline and stable Postop Assessment: no apparent nausea or vomiting Anesthetic complications: no    Last Vitals:  Vitals:   04/27/18 0930 04/27/18 1414  BP: (!) 121/57 111/71  Pulse: 74 82  Resp:    Temp: 36.8 C 36.7 C  SpO2: 98% 100%    Last Pain:  Vitals:   04/27/18 1414  TempSrc: Oral  PainSc:                  Barnet Glasgow

## 2018-04-27 NOTE — Telephone Encounter (Signed)
Called and spoke with pts husband per Dr. Honor Junes request.  Needed to find out about the oxycodone rx that was needing to be refilled, but was refilled on 9/10 by another provider.  Pts husband stated that the pt had broken her leg and is currently in the hospital and that is where the rx was filled from.  Pt will go to a rehab facility once she is discharged.  The pts husband stated that he would let us know when he needed another refill for her.  He did state that he has about a week or so of meds left for her at home now.

## 2018-04-27 NOTE — Plan of Care (Signed)

## 2018-04-27 NOTE — Progress Notes (Signed)
Occupational Therapy Evaluation Patient Details Name: Stacy Diaz MRN: [id number] DOB: 06-02-1951 Today's Date: 04/27/2018    History of Present Illness Stacy Diaz is a 67 y.o. 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 s/p IM nail, right tibia.   Clinical Impression   PTA Pt required assist for bathing and dressing from husband or caretaker (who assists with ADL and light housework 5 days a week 4 hours a day). Pt typically transfers using max A and a disc transfer. Pt is currently total A for LB ADL, and set up for eating/grooming at bed level or with truncal support. Lift equipment used for transfer as Pt reports "I can't scoot" for potential AP transfer. OT will continue to follow acutely, and Pt will require SNF level therapy at dc to maximize safety and independence in ADL and functional transfer.     Follow Up Recommendations  SNF;Supervision/Assistance - 24 hour    Equipment Recommendations  None recommended by OT(Pt has appropriate DME)    Recommendations for Other Services       Precautions / Restrictions Precautions Precautions: Fall Precaution Comments: Increased tone in LE's; husband warns about extensor tone in sitting Restrictions Weight Bearing Restrictions: Yes RLE Weight Bearing: Non weight bearing      Mobility Bed Mobility Overal bed mobility: Needs Assistance Bed Mobility: Rolling Rolling: Mod assist;+2 for physical assistance;Max assist;+2 for safety/equipment         General bed mobility comments: Pt mod A-max A +2 for rolling with cues for hand placement. Max assist required in order to lift LE +1 while Mod A required to assist pt in rolling in order to put maxi move lift pad under pt   Transfers Overall transfer level: Needs assistance               General transfer  comment: Maximove utilized for transition to chair. +3 required for positioning, use of mechanical lift, and support of the RLE (held up with pillow during transfer).     Balance Overall balance assessment: Needs assistance     Sitting balance - Comments: Unable to assess this session however pt reports at baseline she has difficulty with sitting balance and requires significant assistance.                                    ADL either performed or assessed with clinical judgement   ADL Overall ADL's : Needs assistance/impaired Eating/Feeding: Sitting;Bed level;Set up   Grooming: Set up;Sitting;Bed level   Upper Body Bathing: Minimal assistance   Lower Body Bathing: Total assistance;Bed level   Upper Body Dressing : Maximal assistance;Sitting Upper Body Dressing Details (indicate cue type and reason): spouse reports very limited trunk control Lower Body Dressing: Total assistance;Bed level   Toilet Transfer: Total assistance;+2 for safety/equipment(using lift equipment)   Toileting- Clothing Manipulation and Hygiene: Total assistance Toileting - Clothing Manipulation Details (indicate cue type and reason): assisted for peri care and removal of mesh underwear total A +2 helpful     Functional mobility during ADLs: (lift only)       Vision Baseline Vision/History: Wears glasses Wears Glasses: At all times Patient Visual Report: No change from baseline       Perception     Praxis      Pertinent Vitals/Pain  Pain Assessment: Faces Faces Pain Scale: Hurts even more Pain Location: RLE Pain Descriptors / Indicators: Discomfort;Grimacing;Operative site guarding Pain Intervention(s): Limited activity within patient's tolerance;Monitored during session;Repositioned     Hand Dominance Right   Extremity/Trunk Assessment Upper Extremity Assessment Upper Extremity Assessment: Overall WFL for tasks assessed(at baseline)   Lower Extremity Assessment Lower  Extremity Assessment: Defer to PT evaluation RLE Deficits / Details: s/p IM nail R tibia; No motor function at baseline in RLE RLE Sensation: WNL LLE Deficits / Details: No motor function in LLE at baseline LLE Sensation: WNL   Cervical / Trunk Assessment Cervical / Trunk Assessment: Other exceptions Cervical / Trunk Exceptions: unable to assess(Spouse reports decreased trunk control)   Communication Communication Communication: No difficulties   Cognition Arousal/Alertness: Awake/alert Behavior During Therapy: WFL for tasks assessed/performed Overall Cognitive Status: Within Functional Limits for tasks assessed                                     General Comments  Husband present throughout session. He uses a rollator and expresses concerns about caregiving at home now that she is NWB    Exercises     Shoulder Instructions      Home Living Family/patient expects to be discharged to:: Skilled nursing facility Living Arrangements: Spouse/significant other Available Help at Discharge: Family;Personal care attendant;Available 24 hours/day Type of Home: House Home Access: Ramped entrance;Other (comment)(chair lift )     Home Layout: One level     Bathroom Shower/Tub: Occupational psychologist: Handicapped height Bathroom Accessibility: Yes How Accessible: Accessible via wheelchair Home Equipment: Wheelchair - power;Shower seat;Other (comment)(hoyer lift)   Additional Comments: Platform lift to get in/out of the house. Car has a turn seat in it. Disc transfer for inside the house.       Prior Functioning/Environment Level of Independence: Needs assistance  Gait / Transfers Assistance Needed: spin disc for transfers, assist for all transfers which have been becoming more difficult ADL's / [place] Needed: caregiver/husband assist for bathing/dressing/LB ADL and toilet transfers. Pt wears depends normally   Comments: Caregiver 5x/week  for 4 hours a day. Caregiver does light housework and assists with ADL's        OT Problem List: Impaired balance (sitting and/or standing);Decreased activity tolerance;Pain;Decreased strength      OT Treatment/Interventions: Therapeutic exercise;DME and/or AE instruction;Manual therapy;Therapeutic activities;Patient/family education;Balance training    OT Goals(Current goals can be found in the care plan section) Acute Rehab OT Goals Patient Stated Goal: Go to rehab for more therapy at d/c OT Goal Formulation: With patient/family Time For Goal Achievement: 05/11/18 Potential to Achieve Goals: Good ADL Goals Pt/caregiver will Perform Home Exercise Program: Both right and left upper extremity;Independently;With written HEP provided Additional ADL Goal #1: Pt will maintain sitting balance EOB as precursor for ADL activity with mod A for 5 min time Additional ADL Goal #2: Pt's caregiver will demonstrate competency for operating lift equipment for transfers at independent level while Pt is NWB RLE  OT Frequency: Min 2X/week   Barriers to D/C:    Husband expressing concerns about being able to transfer her safely in home environment now that she is NWB       Co-evaluation PT/OT/SLP Co-Evaluation/Treatment: Yes Reason for Co-Treatment: Complexity of the patient's impairments (multi-system involvement);For patient/therapist safety;To address functional/ADL transfers PT goals addressed during session: Mobility/safety with mobility;Strengthening/ROM;Proper use of DME OT goals addressed during  session: ADL's and self-care;Strengthening/ROM;Proper use of Adaptive equipment and DME      AM-PAC PT "6 Clicks" Daily Activity     Outcome Measure Help from another person eating meals?: A Little Help from another person taking care of personal grooming?: A Little Help from another person toileting, which includes using toliet, bedpan, or urinal?: Total Help from another person bathing (including  washing, rinsing, drying)?: A Lot Help from another person to put on and taking off regular upper body clothing?: A Lot Help from another person to put on and taking off regular lower body clothing?: Total 6 Click Score: 12   End of Session Equipment Utilized During Treatment: Other (comment)(maxi move) Nurse Communication: Mobility status;Need for lift equipment;Weight bearing status  Activity Tolerance: Patient tolerated treatment well Patient left: in chair;with call bell/phone within reach;with family/visitor present;with nursing/sitter in room  OT Visit Diagnosis: Pain;Other abnormalities of gait and mobility (R26.89) Pain - Right/Left: Right Pain - part of body: Leg                Time: 1353-1445 OT Time Calculation (min): 52 min Charges:  OT General Charges $OT Visit: 1 Visit OT Evaluation $OT Eval Moderate Complexity: 1 Mod OT Treatments $Therapeutic Activity: 8-22 mins  Hulda Humphrey OTR/L Acute Rehabilitation Services Pager: [phone] Office: [place] 04/27/2018, 4:11 PM

## 2018-04-28 ENCOUNTER — Inpatient Hospital Stay (HOSPITAL_COMMUNITY): Payer: PPO

## 2018-04-28 DIAGNOSIS — Z86011 Personal history of benign neoplasm of the brain: Secondary | ICD-10-CM | POA: Diagnosis not present

## 2018-04-28 DIAGNOSIS — M545 Low back pain: Secondary | ICD-10-CM | POA: Diagnosis not present

## 2018-04-28 DIAGNOSIS — T148XXA Other injury of unspecified body region, initial encounter: Secondary | ICD-10-CM | POA: Diagnosis not present

## 2018-04-28 DIAGNOSIS — M2550 Pain in unspecified joint: Secondary | ICD-10-CM | POA: Diagnosis not present

## 2018-04-28 DIAGNOSIS — G8220 Paraplegia, unspecified: Secondary | ICD-10-CM | POA: Diagnosis not present

## 2018-04-28 DIAGNOSIS — Z9889 Other specified postprocedural states: Secondary | ICD-10-CM | POA: Diagnosis not present

## 2018-04-28 DIAGNOSIS — S82201S Unspecified fracture of shaft of right tibia, sequela: Secondary | ICD-10-CM | POA: Diagnosis not present

## 2018-04-28 DIAGNOSIS — M62830 Muscle spasm of back: Secondary | ICD-10-CM | POA: Diagnosis not present

## 2018-04-28 DIAGNOSIS — R261 Paralytic gait: Secondary | ICD-10-CM | POA: Diagnosis not present

## 2018-04-28 DIAGNOSIS — M79671 Pain in right foot: Secondary | ICD-10-CM | POA: Diagnosis not present

## 2018-04-28 DIAGNOSIS — Z4789 Encounter for other orthopedic aftercare: Secondary | ICD-10-CM | POA: Diagnosis not present

## 2018-04-28 DIAGNOSIS — S8490XS Injury of unspecified nerve at lower leg level, unspecified leg, sequela: Secondary | ICD-10-CM | POA: Diagnosis not present

## 2018-04-28 DIAGNOSIS — G40909 Epilepsy, unspecified, not intractable, without status epilepticus: Secondary | ICD-10-CM | POA: Diagnosis not present

## 2018-04-28 DIAGNOSIS — Z8673 Personal history of transient ischemic attack (TIA), and cerebral infarction without residual deficits: Secondary | ICD-10-CM | POA: Diagnosis not present

## 2018-04-28 DIAGNOSIS — R41841 Cognitive communication deficit: Secondary | ICD-10-CM | POA: Diagnosis not present

## 2018-04-28 DIAGNOSIS — S82401S Unspecified fracture of shaft of right fibula, sequela: Secondary | ICD-10-CM | POA: Diagnosis not present

## 2018-04-28 DIAGNOSIS — D329 Benign neoplasm of meninges, unspecified: Secondary | ICD-10-CM | POA: Diagnosis not present

## 2018-04-28 DIAGNOSIS — G839 Paralytic syndrome, unspecified: Secondary | ICD-10-CM | POA: Diagnosis not present

## 2018-04-28 DIAGNOSIS — M6281 Muscle weakness (generalized): Secondary | ICD-10-CM | POA: Diagnosis not present

## 2018-04-28 DIAGNOSIS — F411 Generalized anxiety disorder: Secondary | ICD-10-CM | POA: Diagnosis not present

## 2018-04-28 DIAGNOSIS — S82251D Displaced comminuted fracture of shaft of right tibia, subsequent encounter for closed fracture with routine healing: Secondary | ICD-10-CM | POA: Diagnosis not present

## 2018-04-28 DIAGNOSIS — S82401D Unspecified fracture of shaft of right fibula, subsequent encounter for closed fracture with routine healing: Secondary | ICD-10-CM | POA: Diagnosis not present

## 2018-04-28 DIAGNOSIS — N898 Other specified noninflammatory disorders of vagina: Secondary | ICD-10-CM | POA: Diagnosis not present

## 2018-04-28 DIAGNOSIS — I10 Essential (primary) hypertension: Secondary | ICD-10-CM | POA: Diagnosis not present

## 2018-04-28 DIAGNOSIS — S82201A Unspecified fracture of shaft of right tibia, initial encounter for closed fracture: Secondary | ICD-10-CM | POA: Diagnosis not present

## 2018-04-28 DIAGNOSIS — R293 Abnormal posture: Secondary | ICD-10-CM | POA: Diagnosis not present

## 2018-04-28 DIAGNOSIS — D649 Anemia, unspecified: Secondary | ICD-10-CM | POA: Diagnosis not present

## 2018-04-28 DIAGNOSIS — M6249 Contracture of muscle, multiple sites: Secondary | ICD-10-CM | POA: Diagnosis not present

## 2018-04-28 DIAGNOSIS — R52 Pain, unspecified: Secondary | ICD-10-CM | POA: Diagnosis not present

## 2018-04-28 DIAGNOSIS — N390 Urinary tract infection, site not specified: Secondary | ICD-10-CM | POA: Diagnosis present

## 2018-04-28 DIAGNOSIS — G8320 Monoplegia of upper limb affecting unspecified side: Secondary | ICD-10-CM | POA: Diagnosis not present

## 2018-04-28 DIAGNOSIS — Z7401 Bed confinement status: Secondary | ICD-10-CM | POA: Diagnosis not present

## 2018-04-28 DIAGNOSIS — S82201D Unspecified fracture of shaft of right tibia, subsequent encounter for closed fracture with routine healing: Secondary | ICD-10-CM | POA: Diagnosis not present

## 2018-04-28 DIAGNOSIS — Z8603 Personal history of neoplasm of uncertain behavior: Secondary | ICD-10-CM | POA: Diagnosis not present

## 2018-04-28 LAB — COMPREHENSIVE METABOLIC PANEL
ALBUMIN: 3.8 g/dL (ref 3.5–5.0)
ALT: 38 U/L (ref 0–44)
AST: 44 U/L — AB (ref 15–41)
Alkaline Phosphatase: 65 U/L (ref 38–126)
Anion gap: 8 (ref 5–15)
BUN: 6 mg/dL — AB (ref 8–23)
CO2: 23 mmol/L (ref 22–32)
Calcium: 8.9 mg/dL (ref 8.9–10.3)
Chloride: 103 mmol/L (ref 98–111)
Creatinine, Ser: 0.53 mg/dL (ref 0.44–1.00)
GFR calc Af Amer: >60 mL/min (ref >60)
GFR calc non Af Amer: >60 mL/min (ref >60)
GLUCOSE: 120 mg/dL — AB (ref 70–99)
POTASSIUM: 4 mmol/L (ref 3.5–5.1)
Sodium: 134 mmol/L — ABNORMAL LOW (ref 135–145)
Total Bilirubin: 1 mg/dL (ref 0.3–1.2)
Total Protein: 6.5 g/dL (ref 6.5–8.1)

## 2018-04-28 LAB — CBC
HEMATOCRIT: 35.5 % — AB (ref 36.0–46.0)
HEMOGLOBIN: 11.8 g/dL — AB (ref 12.0–15.0)
MCH: 30.3 pg (ref 26.0–34.0)
MCHC: 33.2 g/dL (ref 30.0–36.0)
MCV: 91.3 fL (ref 78.0–100.0)
Platelets: 189 10*3/uL (ref 150–400)
RBC: 3.89 MIL/uL (ref 3.87–5.11)
RDW: 13.1 % (ref 11.5–15.5)
WBC: 10.7 10*3/uL — AB (ref 4.0–10.5)

## 2018-04-28 LAB — TSH: TSH: 0.848 u[IU]/mL (ref 0.350–4.500)

## 2018-04-28 LAB — PHOSPHORUS: Phosphorus: 3.3 mg/dL (ref 2.5–4.6)

## 2018-04-28 LAB — PREALBUMIN: Prealbumin: 23.1 mg/dL (ref 18–38)

## 2018-04-28 LAB — MAGNESIUM: MAGNESIUM: 2 mg/dL (ref 1.7–2.4)

## 2018-04-28 MED ORDER — ENOXAPARIN SODIUM 40 MG/0.4ML ~~LOC~~ SOLN: 40.0000 mg | SUBCUTANEOUS | 0 refills | Status: DC

## 2018-04-28 MED ORDER — CEFDINIR 300 MG PO CAPS: 300.0000 mg | ORAL_CAPSULE | Freq: Two times a day (BID) | ORAL | 0 refills | Status: AC

## 2018-04-28 MED ORDER — ACETAMINOPHEN 500 MG PO TABS: 500.0000 mg | ORAL_TABLET | Freq: Three times a day (TID) | ORAL | 0 refills | Status: DC

## 2018-04-28 MED ORDER — OXYCODONE-ACETAMINOPHEN 10-325 MG PO TABS: 1.0000 | ORAL_TABLET | Freq: Three times a day (TID) | ORAL | 0 refills | Status: DC | PRN

## 2018-04-28 MED ORDER — OXYCODONE HCL 5 MG PO TABS: 5.0000 mg | ORAL_TABLET | ORAL | 0 refills | Status: DC | PRN

## 2018-04-28 MED ORDER — SODIUM CHLORIDE 0.9 % IV SOLN
1.0000 g | INTRAVENOUS | Status: DC
  Administered 2018-04-28: 1 g via INTRAVENOUS
  Filled 2018-04-28: qty 10

## 2018-04-28 NOTE — Progress Notes (Addendum)
Patient will DC to: [name]  Anticipated DC date: 04/28/18 Family notified: Spouse Transport by: Corey Harold   Per MD patient ready for DC to [facility]. RN, patient, patient's family, and facility notified of DC. Discharge Summary sent to facility. RN given number for report [phone] Room [facility]). DC packet on chart. Ambulance transport requested for patient.   CSW signing off.  Cedric Fishman, LCSW Clinical Social Worker [phone]

## 2018-04-28 NOTE — Clinical Social Work Note (Signed)
Clinical Social Work Assessment  Patient Details  Name: Stacy Diaz MRN: [id number] Date of Birth: [birth year]  Date of referral:  04/27/18               Reason for consult:  Facility Placement                Permission sought to share information with:  Family Supports, Customer service manager Permission granted to share information::  Yes, Verbal Permission Granted  Name::     Darla Lesches::  SNFs  Relationship::  Spouse  Contact Information:  [phone]  Housing/Transportation Living arrangements for the past 2 months:  [place] of Information:  Patient, Spouse Patient Interpreter Needed:  None Criminal Activity/Legal Involvement Pertinent to Current Situation/Hospitalization:  No - Comment as needed Significant Relationships:  Spouse Lives with:  Spouse Do you feel safe going back to the place where you live?  No Need for family participation in patient care:  Yes (Comment)  Care giving concerns: CSW received consult for discharge needs.CSW spoke with patient and her spouse regarding PT recommendation of SNF placement at time of discharge. Patient lives in the home with her spouse. Patient and her husband agreed it was in the best interest of the patient to go to SNF once discharged from the hospital.    Social Worker assessment / plan:  CSW spoke with patient concerning possibility of rehab at [facility] before returning home.   Employment status:  Disabled (Comment on whether or not currently receiving Disability) Insurance information:  Medicare PT Recommendations:  [place] / Referral to community resources:  [place]  Patient/Family's Response to care: Patient recognizes need for rehab before returning home and is agreeable to a SNF in [place]. Patient reported preference for [facility].  Patient/Family's Understanding of and Emotional Response to Diagnosis, Current Treatment, and Prognosis:   Patient and her spouse is realistic regarding therapy needs and expressed being hopeful for SNF placement. Patient expressed understanding of CSW role and discharge process as well as medical condition. No questions/concerns about plan or treatment.   Emotional Assessment Appearance:  Appears stated age Attitude/Demeanor/Rapport:  Self-Confident, Engaged Affect (typically observed):  Apprehensive, Accepting, Hopeful Orientation:  Oriented to Self, Oriented to Place, Oriented to  Time, Oriented to Situation Alcohol / Substance use:  Not Applicable Psych involvement (Current and /or in the community):  No (Comment)  Discharge Needs  Concerns to be addressed:  Care Coordination Readmission within the last 30 days:  No Current discharge risk:  Dependent with Mobility Barriers to Discharge:  Continued Medical Work up   [employer], [place] 04/28/2018, 9:50 AM

## 2018-04-28 NOTE — Plan of Care (Signed)

## 2018-04-28 NOTE — Progress Notes (Signed)
IV removed. All belongings returned to patient. Husband notified of discharge. Escorted off unit by transport.
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
## 2018-04-28 NOTE — Discharge Instructions (Signed)
Orthopaedic Trauma Service Discharge Instructions   General Discharge Instructions  WEIGHT BEARING STATUS: Nonweightbearing right leg   RANGE OF MOTION/ACTIVITY: range of motion as tolerated right knee and hip   Wound Care: do not remove splint, keep splint clean and dry. We will remove splint at first follow up appointment   DVT/PE prophylaxis: lovenox 40 mg subcutaneous injection daily x 21 days   Diet: as you were eating previously.  Can use over the counter stool softeners and bowel preparations, such as Miralax, to help with bowel movements.  Narcotics can be constipating.  Be sure to drink plenty of fluids  PAIN MEDICATION USE AND EXPECTATIONS  You have likely been given narcotic medications to help control your pain.  After a traumatic event that results in an fracture (broken bone) with or without surgery, it is ok to use narcotic pain medications to help control one's pain.  We understand that everyone responds to pain differently and each individual patient will be evaluated on a regular basis for the continued need for narcotic medications. Ideally, narcotic medication use should last no more than 6-8 weeks (coinciding with fracture healing).   As a patient it is your responsibility as well to monitor narcotic medication use and report the amount and frequency you use these medications when you come to your office visit.   We would also advise that if you are using narcotic medications, you should take a dose prior to therapy to maximize you participation.  IF YOU ARE ON NARCOTIC MEDICATIONS IT IS NOT PERMISSIBLE TO OPERATE A MOTOR VEHICLE (MOTORCYCLE/CAR/TRUCK/MOPED) OR HEAVY MACHINERY DO NOT MIX NARCOTICS WITH OTHER CNS (CENTRAL NERVOUS SYSTEM) DEPRESSANTS SUCH AS ALCOHOL   STOP SMOKING OR USING NICOTINE PRODUCTS!!!!  As discussed nicotine severely impairs your body's ability to heal surgical and traumatic wounds but also impairs bone healing.  Wounds and bone heal by forming  microscopic blood vessels (angiogenesis) and nicotine is a vasoconstrictor (essentially, shrinks blood vessels).  Therefore, if vasoconstriction occurs to these microscopic blood vessels they essentially disappear and are unable to deliver necessary nutrients to the healing tissue.  This is one modifiable factor that you can do to dramatically increase your chances of healing your injury.    (This means no smoking, no nicotine gum, patches, etc)  DO NOT USE NONSTEROIDAL ANTI-INFLAMMATORY DRUGS (NSAID'S)  Using products such as Advil (ibuprofen), Aleve (naproxen), Motrin (ibuprofen) for additional pain control during fracture healing can delay and/or prevent the healing response.  If you would like to take over the counter (OTC) medication, Tylenol (acetaminophen) is ok.  However, some narcotic medications that are given for pain control contain acetaminophen as well. Therefore, you should not exceed more than 4000 mg of tylenol in a day if you do not have liver disease.  Also note that there are may OTC medicines, such as cold medicines and allergy medicines that my contain tylenol as well.  If you have any questions about medications and/or interactions please ask your doctor/PA or your pharmacist.      ICE AND ELEVATE INJURED/OPERATIVE EXTREMITY  Using ice and elevating the injured extremity above your heart can help with swelling and pain control.  Icing in a pulsatile fashion, such as 20 minutes on and 20 minutes off, can be followed.    Do not place ice directly on skin. Make sure there is a barrier between to skin and the ice pack.    Using frozen items such as frozen peas works well as the conform  nicely to the are that needs to be iced.  USE AN ACE WRAP OR TED HOSE FOR SWELLING CONTROL  In addition to icing and elevation, Ace wraps or TED hose are used to help limit and resolve swelling.  It is recommended to use Ace wraps or TED hose until you are informed to stop.    When using Ace Wraps  start the wrapping distally (farthest away from the body) and wrap proximally (closer to the body)   Example: If you had surgery on your leg or thing and you do not have a splint on, start the ace wrap at the toes and work your way up to the thigh        If you had surgery on your upper extremity and do not have a splint on, start the ace wrap at your fingers and work your way up to the upper arm  IF YOU ARE IN A SPLINT OR CAST DO NOT [place]   If your splint gets wet for any reason please contact the office immediately. You may shower in your splint or cast as long as you keep it dry.  This can be done by wrapping in a cast cover or garbage back (or similar)  Do Not stick any thing down your splint or cast such as pencils, money, or hangers to try and scratch yourself with.  If you feel itchy take benadryl as prescribed on the bottle for itching  IF YOU ARE IN A CAM BOOT (BLACK BOOT)  You may remove boot periodically. Perform daily dressing changes as noted below.  Wash the liner of the boot regularly and wear a sock when wearing the boot. It is recommended that you sleep in the boot until told otherwise  CALL THE OFFICE WITH ANY QUESTIONS OR CONCERNS: [phone]

## 2018-04-28 NOTE — Clinical Social Work Placement (Signed)
   CLINICAL SOCIAL WORK PLACEMENT  NOTE  Date:  04/28/2018  Patient Details  Name: Stacy Diaz MRN: [id number] Date of Birth: 05-15-1951  Clinical Social Work is seeking post-discharge placement for this patient at the [place] level of care (*CSW will initial, date and re-position this form in  chart as items are completed):  Yes   Patient/family provided with [place] Work Department's list of facilities offering this level of care within the geographic area requested by the patient (or if unable, by the patient's family).  Yes   Patient/family informed of their freedom to choose among providers that offer the needed level of care, that participate in Medicare, Medicaid or managed care program needed by the patient, have an available bed and are willing to accept the patient.  Yes   Patient/family informed of [facility]'s ownership interest in [facility] and [facility], as well as of the fact that they are under no obligation to receive care at these facilities.  PASRR submitted to EDS on       PASRR number received on       Existing PASRR number confirmed on 04/28/18     FL2 transmitted to all facilities in geographic area requested by pt/family on 04/28/18     FL2 transmitted to all facilities within larger geographic area on       Patient informed that his/her managed care company has contracts with or will negotiate with certain facilities, including the following:        Yes   Patient/family informed of bed offers received.  Patient chooses bed at [facility]     Physician recommends and patient chooses bed at      Patient to be transferred to [facility] on 04/28/18.  Patient to be transferred to facility by [facility]     Patient family notified on 04/28/18 of transfer.  Name of family member notified:  Spouse     PHYSICIAN       Additional Comment:    _______________________________________________ Benard Halsted, [place] 04/28/2018, 3:57 PM

## 2018-04-28 NOTE — Progress Notes (Signed)
Patient discharging to [facility]. Report called in to [facility] LPN. Awaiting transportation.

## 2018-04-29 LAB — URINE CULTURE: Culture: NO GROWTH

## 2018-04-29 LAB — PTH, INTACT AND CALCIUM
CALCIUM TOTAL (PTH): 8.9 mg/dL (ref 8.7–10.3)
PTH: 38 pg/mL (ref 15–65)

## 2018-04-29 LAB — CALCITRIOL (1,25 DI-OH VIT D): VIT D 1 25 DIHYDROXY: 56.8 pg/mL (ref 19.9–79.3)

## 2018-04-29 LAB — CALCIUM, IONIZED: Calcium, Ionized, Serum: 4.9 mg/dL (ref 4.5–5.6)

## 2018-04-29 LAB — VITAMIN D 25 HYDROXY (VIT D DEFICIENCY, FRACTURES): VIT D 25 HYDROXY: 30.9 ng/mL (ref 30.0–100.0)

## 2018-04-29 NOTE — Consult Note (Signed)
            [facility] CM Primary Care Navigator  04/29/2018  Stacy Diaz 1951-05-13 [id number]   Went to see patientat the bedside to identify possible discharge needs but she wasalreadydischarge perstaff.  Patientwas discharged to skilled nursing facility per therapy recommendation (SNF).  Per chart review,patient had presented with mechanical fall resulting inright tibia- fibulafracture. (status post IM- intramedullary nail right tibia)  Primary care provider's office is listed as providing transition of care (TOC) follow-up.  Patient has discharge instruction to follow-up withprimary care provider in 1- 2 weeks and orthopedic follow-up in 10 days.   For additional questions please contact:  Edwena Felty A. Jamari Diana, BSN, RN-BC [facility] PRIMARY CARE Navigator Cell: [phone]

## 2018-05-02 DIAGNOSIS — S82401D Unspecified fracture of shaft of right fibula, subsequent encounter for closed fracture with routine healing: Secondary | ICD-10-CM | POA: Diagnosis not present

## 2018-05-02 DIAGNOSIS — R52 Pain, unspecified: Secondary | ICD-10-CM | POA: Diagnosis not present

## 2018-05-02 DIAGNOSIS — S82201D Unspecified fracture of shaft of right tibia, subsequent encounter for closed fracture with routine healing: Secondary | ICD-10-CM | POA: Diagnosis not present

## 2018-05-02 DIAGNOSIS — G839 Paralytic syndrome, unspecified: Secondary | ICD-10-CM | POA: Diagnosis not present

## 2018-05-03 DIAGNOSIS — G8220 Paraplegia, unspecified: Secondary | ICD-10-CM | POA: Diagnosis not present

## 2018-05-03 DIAGNOSIS — R261 Paralytic gait: Secondary | ICD-10-CM | POA: Diagnosis not present

## 2018-05-03 DIAGNOSIS — S82201A Unspecified fracture of shaft of right tibia, initial encounter for closed fracture: Secondary | ICD-10-CM | POA: Diagnosis not present

## 2018-05-03 DIAGNOSIS — M62830 Muscle spasm of back: Secondary | ICD-10-CM | POA: Diagnosis not present

## 2018-05-05 DIAGNOSIS — M62830 Muscle spasm of back: Secondary | ICD-10-CM | POA: Diagnosis not present

## 2018-05-05 DIAGNOSIS — R261 Paralytic gait: Secondary | ICD-10-CM | POA: Diagnosis not present

## 2018-05-05 DIAGNOSIS — S82201A Unspecified fracture of shaft of right tibia, initial encounter for closed fracture: Secondary | ICD-10-CM | POA: Diagnosis not present

## 2018-05-05 DIAGNOSIS — G8220 Paraplegia, unspecified: Secondary | ICD-10-CM | POA: Diagnosis not present

## 2018-05-07 ENCOUNTER — Other Ambulatory Visit: Payer: Self-pay | Admitting: Family Medicine

## 2018-05-09 DIAGNOSIS — S82201A Unspecified fracture of shaft of right tibia, initial encounter for closed fracture: Secondary | ICD-10-CM | POA: Diagnosis not present

## 2018-05-09 DIAGNOSIS — M62830 Muscle spasm of back: Secondary | ICD-10-CM | POA: Diagnosis not present

## 2018-05-09 DIAGNOSIS — R261 Paralytic gait: Secondary | ICD-10-CM | POA: Diagnosis not present

## 2018-05-09 DIAGNOSIS — G8220 Paraplegia, unspecified: Secondary | ICD-10-CM | POA: Diagnosis not present

## 2018-05-11 DIAGNOSIS — G8220 Paraplegia, unspecified: Secondary | ICD-10-CM | POA: Diagnosis not present

## 2018-05-11 DIAGNOSIS — S82201A Unspecified fracture of shaft of right tibia, initial encounter for closed fracture: Secondary | ICD-10-CM | POA: Diagnosis not present

## 2018-05-11 DIAGNOSIS — M62830 Muscle spasm of back: Secondary | ICD-10-CM | POA: Diagnosis not present

## 2018-05-11 DIAGNOSIS — R261 Paralytic gait: Secondary | ICD-10-CM | POA: Diagnosis not present

## 2018-05-12 ENCOUNTER — Other Ambulatory Visit: Payer: Self-pay | Admitting: Family Medicine

## 2018-05-12 DIAGNOSIS — D649 Anemia, unspecified: Secondary | ICD-10-CM | POA: Diagnosis not present

## 2018-05-13 DIAGNOSIS — M79671 Pain in right foot: Secondary | ICD-10-CM | POA: Diagnosis not present

## 2018-05-13 DIAGNOSIS — M62830 Muscle spasm of back: Secondary | ICD-10-CM | POA: Diagnosis not present

## 2018-05-13 DIAGNOSIS — R261 Paralytic gait: Secondary | ICD-10-CM | POA: Diagnosis not present

## 2018-05-13 DIAGNOSIS — S82201A Unspecified fracture of shaft of right tibia, initial encounter for closed fracture: Secondary | ICD-10-CM | POA: Diagnosis not present

## 2018-05-13 DIAGNOSIS — G8220 Paraplegia, unspecified: Secondary | ICD-10-CM | POA: Diagnosis not present

## 2018-05-17 DIAGNOSIS — M62830 Muscle spasm of back: Secondary | ICD-10-CM | POA: Diagnosis not present

## 2018-05-17 DIAGNOSIS — R261 Paralytic gait: Secondary | ICD-10-CM | POA: Diagnosis not present

## 2018-05-17 DIAGNOSIS — G8220 Paraplegia, unspecified: Secondary | ICD-10-CM | POA: Diagnosis not present

## 2018-05-17 DIAGNOSIS — S82201A Unspecified fracture of shaft of right tibia, initial encounter for closed fracture: Secondary | ICD-10-CM | POA: Diagnosis not present

## 2018-05-17 DIAGNOSIS — M79671 Pain in right foot: Secondary | ICD-10-CM | POA: Diagnosis not present

## 2018-05-18 DIAGNOSIS — S82251D Displaced comminuted fracture of shaft of right tibia, subsequent encounter for closed fracture with routine healing: Secondary | ICD-10-CM | POA: Diagnosis not present

## 2018-05-19 DIAGNOSIS — G8220 Paraplegia, unspecified: Secondary | ICD-10-CM | POA: Diagnosis not present

## 2018-05-19 DIAGNOSIS — R261 Paralytic gait: Secondary | ICD-10-CM | POA: Diagnosis not present

## 2018-05-19 DIAGNOSIS — S82201A Unspecified fracture of shaft of right tibia, initial encounter for closed fracture: Secondary | ICD-10-CM | POA: Diagnosis not present

## 2018-05-19 DIAGNOSIS — M62830 Muscle spasm of back: Secondary | ICD-10-CM | POA: Diagnosis not present

## 2018-05-23 DIAGNOSIS — G8220 Paraplegia, unspecified: Secondary | ICD-10-CM | POA: Diagnosis not present

## 2018-05-23 DIAGNOSIS — M6249 Contracture of muscle, multiple sites: Secondary | ICD-10-CM | POA: Diagnosis not present

## 2018-05-23 DIAGNOSIS — S82201A Unspecified fracture of shaft of right tibia, initial encounter for closed fracture: Secondary | ICD-10-CM | POA: Diagnosis not present

## 2018-05-23 DIAGNOSIS — R261 Paralytic gait: Secondary | ICD-10-CM | POA: Diagnosis not present

## 2018-05-27 DIAGNOSIS — S82201A Unspecified fracture of shaft of right tibia, initial encounter for closed fracture: Secondary | ICD-10-CM | POA: Diagnosis not present

## 2018-05-27 DIAGNOSIS — G839 Paralytic syndrome, unspecified: Secondary | ICD-10-CM | POA: Diagnosis not present

## 2018-05-27 DIAGNOSIS — G8220 Paraplegia, unspecified: Secondary | ICD-10-CM | POA: Diagnosis not present

## 2018-05-27 DIAGNOSIS — F411 Generalized anxiety disorder: Secondary | ICD-10-CM | POA: Diagnosis not present

## 2018-05-27 DIAGNOSIS — R261 Paralytic gait: Secondary | ICD-10-CM | POA: Diagnosis not present

## 2018-05-27 DIAGNOSIS — M6249 Contracture of muscle, multiple sites: Secondary | ICD-10-CM | POA: Diagnosis not present

## 2018-05-27 DIAGNOSIS — S82201D Unspecified fracture of shaft of right tibia, subsequent encounter for closed fracture with routine healing: Secondary | ICD-10-CM | POA: Diagnosis not present

## 2018-05-27 DIAGNOSIS — S82401D Unspecified fracture of shaft of right fibula, subsequent encounter for closed fracture with routine healing: Secondary | ICD-10-CM | POA: Diagnosis not present

## 2018-05-29 ENCOUNTER — Other Ambulatory Visit: Payer: Self-pay | Admitting: Family Medicine

## 2018-06-02 DIAGNOSIS — D329 Benign neoplasm of meninges, unspecified: Secondary | ICD-10-CM | POA: Diagnosis not present

## 2018-06-02 DIAGNOSIS — G839 Paralytic syndrome, unspecified: Secondary | ICD-10-CM | POA: Diagnosis not present

## 2018-06-02 DIAGNOSIS — S82201D Unspecified fracture of shaft of right tibia, subsequent encounter for closed fracture with routine healing: Secondary | ICD-10-CM | POA: Diagnosis not present

## 2018-06-02 DIAGNOSIS — I10 Essential (primary) hypertension: Secondary | ICD-10-CM | POA: Diagnosis not present

## 2018-06-06 DIAGNOSIS — S82201A Unspecified fracture of shaft of right tibia, initial encounter for closed fracture: Secondary | ICD-10-CM | POA: Diagnosis not present

## 2018-06-06 DIAGNOSIS — M6249 Contracture of muscle, multiple sites: Secondary | ICD-10-CM | POA: Diagnosis not present

## 2018-06-06 DIAGNOSIS — G8220 Paraplegia, unspecified: Secondary | ICD-10-CM | POA: Diagnosis not present

## 2018-06-06 DIAGNOSIS — R261 Paralytic gait: Secondary | ICD-10-CM | POA: Diagnosis not present

## 2018-06-07 DIAGNOSIS — I10 Essential (primary) hypertension: Secondary | ICD-10-CM | POA: Diagnosis not present

## 2018-06-07 DIAGNOSIS — D649 Anemia, unspecified: Secondary | ICD-10-CM | POA: Diagnosis not present

## 2018-06-07 DIAGNOSIS — N898 Other specified noninflammatory disorders of vagina: Secondary | ICD-10-CM | POA: Diagnosis not present

## 2018-06-08 DIAGNOSIS — S82251D Displaced comminuted fracture of shaft of right tibia, subsequent encounter for closed fracture with routine healing: Secondary | ICD-10-CM | POA: Diagnosis not present

## 2018-06-10 DIAGNOSIS — Z8673 Personal history of transient ischemic attack (TIA), and cerebral infarction without residual deficits: Secondary | ICD-10-CM | POA: Diagnosis not present

## 2018-06-10 DIAGNOSIS — G839 Paralytic syndrome, unspecified: Secondary | ICD-10-CM | POA: Diagnosis not present

## 2018-06-10 DIAGNOSIS — S82201D Unspecified fracture of shaft of right tibia, subsequent encounter for closed fracture with routine healing: Secondary | ICD-10-CM | POA: Diagnosis not present

## 2018-06-10 DIAGNOSIS — G40909 Epilepsy, unspecified, not intractable, without status epilepticus: Secondary | ICD-10-CM | POA: Diagnosis not present

## 2018-06-15 ENCOUNTER — Telehealth: Payer: Self-pay | Admitting: Family Medicine

## 2018-06-15 ENCOUNTER — Other Ambulatory Visit: Payer: Self-pay | Admitting: *Deleted

## 2018-06-15 DIAGNOSIS — G8220 Paraplegia, unspecified: Secondary | ICD-10-CM | POA: Diagnosis not present

## 2018-06-15 DIAGNOSIS — Z8673 Personal history of transient ischemic attack (TIA), and cerebral infarction without residual deficits: Secondary | ICD-10-CM | POA: Diagnosis not present

## 2018-06-15 DIAGNOSIS — Z853 Personal history of malignant neoplasm of breast: Secondary | ICD-10-CM | POA: Diagnosis not present

## 2018-06-15 DIAGNOSIS — R41841 Cognitive communication deficit: Secondary | ICD-10-CM | POA: Diagnosis not present

## 2018-06-15 DIAGNOSIS — Z86011 Personal history of benign neoplasm of the brain: Secondary | ICD-10-CM | POA: Diagnosis not present

## 2018-06-15 DIAGNOSIS — G40209 Localization-related (focal) (partial) symptomatic epilepsy and epileptic syndromes with complex partial seizures, not intractable, without status epilepticus: Secondary | ICD-10-CM | POA: Diagnosis not present

## 2018-06-15 DIAGNOSIS — I2510 Atherosclerotic heart disease of native coronary artery without angina pectoris: Secondary | ICD-10-CM | POA: Diagnosis not present

## 2018-06-15 DIAGNOSIS — M62830 Muscle spasm of back: Secondary | ICD-10-CM | POA: Diagnosis not present

## 2018-06-15 DIAGNOSIS — S82251D Displaced comminuted fracture of shaft of right tibia, subsequent encounter for closed fracture with routine healing: Secondary | ICD-10-CM | POA: Diagnosis not present

## 2018-06-15 DIAGNOSIS — Z993 Dependence on wheelchair: Secondary | ICD-10-CM | POA: Diagnosis not present

## 2018-06-15 DIAGNOSIS — I10 Essential (primary) hypertension: Secondary | ICD-10-CM | POA: Diagnosis not present

## 2018-06-15 DIAGNOSIS — E785 Hyperlipidemia, unspecified: Secondary | ICD-10-CM | POA: Diagnosis not present

## 2018-06-15 DIAGNOSIS — F411 Generalized anxiety disorder: Secondary | ICD-10-CM | POA: Diagnosis not present

## 2018-06-15 DIAGNOSIS — G8320 Monoplegia of upper limb affecting unspecified side: Secondary | ICD-10-CM | POA: Diagnosis not present

## 2018-06-15 DIAGNOSIS — Z7902 Long term (current) use of antithrombotics/antiplatelets: Secondary | ICD-10-CM | POA: Diagnosis not present

## 2018-06-15 DIAGNOSIS — S8490XS Injury of unspecified nerve at lower leg level, unspecified leg, sequela: Secondary | ICD-10-CM | POA: Diagnosis not present

## 2018-06-15 DIAGNOSIS — S82401D Unspecified fracture of shaft of right fibula, subsequent encounter for closed fracture with routine healing: Secondary | ICD-10-CM | POA: Diagnosis not present

## 2018-06-15 NOTE — Patient Outreach (Signed)
[facility]) Care Management  06/15/2018  Stacy Diaz May 06, 1951 [id number]   Transition of Care Referral   Referral Date: 06/14/18 Referral Source: [facility] Urgent Outreach for [facility] Date of Admission: 04/28/18 Diagnosis: Unsp fracture of shaft of right fibula, init for clos fx Date of Discharge: on 06/10/18 Facility: [facility]. Insurance: HTA  Outreach attempt # 1 successful at the home number  Patient's spouse, [name], is able to verify HIPAA Reviewed and addressed Transitional of care referral with patient Mr Souter confirmed this hospitalization injury related Stacy Diaz around a corner in her w/c,  caught the toe ofher rightshoe on something, twisted her foot and she heard a snap. Dx Closed fracture of right tibia and fibula Voiced concern about delay in home health but resolved Voiced concern with care at [facility]   Transition of care assessment reviewed  Social: Stacy Massmann lives with her husband and has been w/c bound since 2007 Prior to this last fx in her powered w/c she was able to transfer herself to w/c, etc but now is relying on a manual hoyer lift. Her primary caregiver is her husband and Self pay care giver 5 hours a day/ 7 days a week at this time.  She is assist to dependent in all care.  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; seizures; breast cancer; CAD; HTN, HLD, PVC, w/c bound since 2007,  DME manuel hoyer, powered w/c   Fall recent fall resulting in hospitalization  Medications: denies concerns with taking medications as prescribed, affording medications, side effects of  medications and questions about medications   Appointments:primary MD 06/20/18  Seen surgeon 07/09/17 to see again in 6 weeks Scheduled to see primary MD on next week by Dr Sherren Mocha will be retiring, last visit to him and has been referred to another MD in the office   Advance directives: Mr Cassandria Santee is POA  Consent: [facility] RN CM reviewed [facility] services with patient. Patient gave verbal consent for services for [place] patient that other post discharge calls may occur to assess how the patient is doing following the recent hospitalization. Patient voiced understanding and was appreciative of f/u call.  Plan: [facility] RN CM will refer Stacy Kong to [facility] (alternative cost efficient transportation to medical appointments, Had concerns about treatment at snf, community resources, primary caregiver resources, personal care services resources, possible financial resources, Doctor visits at home)   [facility] RN CM sent a successful outreach letter as discussed with [facility] brochure and a list of MDs that CM is aware of that visit the home enclosed for review  CM left a voice message top informed Mr Prisco of the list of MDs in the outreach letter sent  Pt encouraged to return a call to [facility] RN CM prn  Joelene Millin L. Lavina Hamman, RN, BSN, [place] [facility] Care Coordinator Direct Number [phone] Mobile number [phone]  Main [facility] number [phone] Fax number [phone]

## 2018-06-15 NOTE — Telephone Encounter (Signed)
Copied from [place] [id number]. Topic: Quick Communication - Home Health Verbal Orders >> Jun 15, 2018  4:16 PM Cecelia Byars, NT wrote: Caller/Agency:[facility]   This is a urgent request and  [facility] will not be able to see the patient this week without orders   Callback Number:  [phone]  Requesting /PT/ Frequency  2 x a week 4 weeks  1 x week  1 week

## 2018-06-15 NOTE — Telephone Encounter (Signed)
LMTCB  We have not seen pt in 5 months. Dr. Sherren Mocha will not be back in office until next week.

## 2018-06-17 ENCOUNTER — Other Ambulatory Visit: Payer: Self-pay

## 2018-06-17 NOTE — Patient Outreach (Signed)
[facility]) Care Management  06/17/2018  Stacy Diaz 30-Apr-1951 [id number]  Successful outreach to the patients souse and caregiver Stacy Diaz. Patient HIPAA identifiers confirmed. BSW introduced self to Stacy Diaz and discussed reason for today's call, indicating BSW referred to assist the patient with community resource needs. It is stated the patient has several needs including home modifications, transportation, caregiver resources. The patient's monthly income far exceeds the Medicaid income limit.   Home Modifications: The patient is wheelchair bound and received a grant several years ago to remodel her bathroom to have a wheelchair accessible shower. The patient has a vertical platform lift which allows her to leave the home. Stacy Diaz requests a resource to assist with widening door frames for maneuverability. The patient recently suffered a fractured tibia after her foot became caught in a door frame. BSW discussed the independent living program provided by vocational rehab with Stacy Diaz. Stacy Diaz is in agreement with patient referral. BSW successfully placed referral on today's date. Stacy Diaz understands it may take several weeks before he is contacted by the program coordinator.  Transportation: The patient is no longer able to transfer into a vehicle. Stacy Diaz has arranged wheelchair transportation through an agency for a follow up appointment. He reports a 4 mile trip will cost over $100. BSW discussed SCAT services with Stacy Diaz. Patient has never applied for SCAT due to "long application". Stacy Diaz agreeable to this BSW assisting with the completion of this application. BSW explained the application process during today's call. BSW to mail information to the patients home for review as well.  Caregiver Resources: At this time the patient has a private pay caregiver to assist 5 hours per day 6 days a week. The patient receives income  from social security ($1500) as well as an IRA ($1,000). Stacy Diaz reports the patients IRA will run out in the next 12 months. There is concern over affording a caregiver at that time. The patient is not interested in placement. BSW discussed the in home aide service program provided by DSS. BSW successfully placed the patient on the waiting list. Stacy Diaz understands this program has a lengthy wait list and may take up to two years before the patient is served.  BSW also spoke with Stacy Diaz about [facility] as an option to help meet the patients needs. Stacy Diaz reports meeting with a [facility] representative several years ago. Unfortunately, the out of pocket cost was not feasible for the patient to receive services.   Plan: BSW to outreach Stacy Diaz in the next two weeks to confirm receipt of mailed resources.  Daneen Schick, BSW, CDP [facility] [phone]

## 2018-06-20 ENCOUNTER — Inpatient Hospital Stay: Payer: PPO | Admitting: Family Medicine

## 2018-06-20 NOTE — Telephone Encounter (Signed)
Caller name: Cecilie Lowers  Relation to pt: PT from [facility]  Call back number: [phone]    Reason for call:  Requesting verbal orders for PT for tomorrow, please advise

## 2018-06-20 NOTE — Telephone Encounter (Signed)
Stacy Diaz calling to check status

## 2018-06-21 NOTE — Telephone Encounter (Signed)
Left detailed message informing [facility]ig of update. Dr.Todd does not feel comfortable giving vo to [facility] for pt since this issue falls under ortho. Pt was seen for broken leg by orthopedic surgeon.

## 2018-06-22 ENCOUNTER — Ambulatory Visit (INDEPENDENT_AMBULATORY_CARE_PROVIDER_SITE_OTHER): Payer: PPO | Admitting: Family Medicine

## 2018-06-22 ENCOUNTER — Other Ambulatory Visit: Payer: Self-pay | Admitting: Family Medicine

## 2018-06-22 ENCOUNTER — Encounter: Payer: Self-pay | Admitting: Family Medicine

## 2018-06-22 DIAGNOSIS — M85859 Other specified disorders of bone density and structure, unspecified thigh: Secondary | ICD-10-CM | POA: Diagnosis not present

## 2018-06-22 DIAGNOSIS — I69839 Monoplegia of upper limb following other cerebrovascular disease affecting unspecified side: Secondary | ICD-10-CM

## 2018-06-22 DIAGNOSIS — I10 Essential (primary) hypertension: Secondary | ICD-10-CM

## 2018-06-22 MED ORDER — OXYCODONE-ACETAMINOPHEN 10-325 MG PO TABS: 1.0000 | ORAL_TABLET | Freq: Three times a day (TID) | ORAL | 0 refills | Status: DC | PRN

## 2018-06-22 NOTE — Progress Notes (Signed)
Stacy Diaz is a delightful 67 year old married female non-smoker comes in today for evaluation of multiple issues  She has a history of hypertension.  She is on Tenormin 75 mg twice daily, lisinopril 20 mg in the morning and 30 mg at bedtime and her blood pressure has been fluctuating.  She was recently hospitalized for a tibial fracture and underwent intramedullary nailing by Dr. Ginette Pitman and did well.  However during the hospital they changed her medication because her blood pressure dropped.  Then her blood pressure went up and she had a TIA before she left the hospital.  Fortunately the next day her symptoms abated and she was able to go to [facility].  She was there for couple weeks and now has been home for 2 weeks.  She has a history of osteopenia.  The orthopedist recommend she get a follow-up bone density.  She needs home PT and OT because of her disability and the recent fracture.  She is decided to switch to Dr. Jerilee Hoh  She was on injectable Lovenox.  She is now on Plavix  She is not on the oxycodone anymore she is on the Percocet 10-3 2 5 3  times daily.  She was given Zanaflex 2 mg twice daily which she is currently taking along with Robaxin.  I wonder if we should taper of the Robaxin.  We will try going to 1 a day instead of 1 twice a day and the Robaxin and then get off of that in a month.  There were no vitals taken for this visit. BP right arm sitting position by me 160/80.  BP at home 140/80 in the morning.  1.  Hypertension at goal........ continue current therapy  2.  Status post right tibial fracture........ IM nailing..... Via Dr. Ginette Pitman.......Marland Kitchen recovering well  3.  Chronic pain........... continue Percocet 3 times daily  Muscle spasm decrease Robaxin from twice daily to daily.  Continue the Zanaflex 2.  Milligrams twice daily.

## 2018-06-22 NOTE — Patient Instructions (Signed)
I sent in your prescription for your Percocet for a 69-month supply to [facility]   I also ordered a bone density to be done at [facility] in December when you get your mammogram with Dr. Dara Lords luck to you in the future and thank you for coming to see me all these years and go BUCKS!!!!!!!!   We also ordered the PT and OT as you requested.  Continue current blood pressure medication  Check your blood pressure Monday Wednesday Friday in the morning when you first get up

## 2018-06-24 ENCOUNTER — Encounter: Payer: Self-pay | Admitting: Family Medicine

## 2018-06-26 ENCOUNTER — Other Ambulatory Visit: Payer: Self-pay | Admitting: Neurology

## 2018-06-26 DIAGNOSIS — R569 Unspecified convulsions: Secondary | ICD-10-CM

## 2018-06-28 ENCOUNTER — Other Ambulatory Visit: Payer: Self-pay

## 2018-06-28 ENCOUNTER — Telehealth: Payer: Self-pay

## 2018-06-28 MED ORDER — TIZANIDINE HCL 2 MG PO CAPS: 2.0000 mg | ORAL_CAPSULE | Freq: Two times a day (BID) | ORAL | 1 refills | Status: DC

## 2018-06-28 NOTE — Telephone Encounter (Signed)
Rx sent in! No further action needed!

## 2018-06-28 NOTE — Telephone Encounter (Signed)
Copied from [place] [id number]. Topic: General - Other >> Jun 28, 2018  2:53 PM Yvette Rack wrote: Reason for CRM: Pt husband [name] requests Rx for Zanaflex (Tizanidine). [name] stated Dr. Sherren Mocha was suppose to send this Rx in after the 06/22/18 appt. David requests call back to discuss Rx for Zanaflex (Tizanidine)

## 2018-06-29 NOTE — Telephone Encounter (Signed)
Spoke to pt husband who confirmed that they received the refill request. Rx was sent in 06/28/2018. No further action needed!

## 2018-07-04 ENCOUNTER — Other Ambulatory Visit: Payer: Self-pay

## 2018-07-04 NOTE — Patient Outreach (Signed)
[facility]) Care Management  07/04/2018  Stacy Diaz May 08, 1951 [id number]  Successful outreach to the patients spouse, Stacy Diaz on today's date. HIPAA identifiers confirmed. BSW informed Stacy Diaz the patients SCAT application had been submitted and he could contact to arrange an eligibility hearing. Stacy Diaz stated understanding. The patient has also been referred to vocation rehab for home modifications. BSW informed Stacy Diaz this BSW spoke with coordinator, Michel Harrow who confirmed referral was received. BSW explained the process of this referral may take quite some time. Stacy Diaz stated understanding. David aware the patient has been placed on the wait list for in home care provided by DSS. BSW reminded this is a long wait list and confirmed Stacy Diaz has the contact number to the program to check the patients place on the waiting list if needed.  BSW to perform a case closure as goals have been met. BSW discussed the ability to self refer if assistance is needed in the future. Stacy Diaz stated understanding and agree with this case closure.  Daneen Schick, BSW, CDP [facility] [phone]

## 2018-07-11 DIAGNOSIS — S82251D Displaced comminuted fracture of shaft of right tibia, subsequent encounter for closed fracture with routine healing: Secondary | ICD-10-CM | POA: Diagnosis not present

## 2018-07-26 ENCOUNTER — Ambulatory Visit (INDEPENDENT_AMBULATORY_CARE_PROVIDER_SITE_OTHER): Payer: PPO | Admitting: Internal Medicine

## 2018-07-26 ENCOUNTER — Encounter: Payer: Self-pay | Admitting: Internal Medicine

## 2018-07-26 ENCOUNTER — Encounter: Payer: PPO | Admitting: Internal Medicine

## 2018-07-26 VITALS — BP 120/84 | HR 58 | Temp 98.7°F

## 2018-07-26 DIAGNOSIS — G839 Paralytic syndrome, unspecified: Secondary | ICD-10-CM | POA: Diagnosis not present

## 2018-07-26 DIAGNOSIS — G8220 Paraplegia, unspecified: Secondary | ICD-10-CM | POA: Insufficient documentation

## 2018-07-26 DIAGNOSIS — Z8673 Personal history of transient ischemic attack (TIA), and cerebral infarction without residual deficits: Secondary | ICD-10-CM

## 2018-07-26 DIAGNOSIS — C50512 Malignant neoplasm of lower-outer quadrant of left female breast: Secondary | ICD-10-CM

## 2018-07-26 DIAGNOSIS — S82401A Unspecified fracture of shaft of right fibula, initial encounter for closed fracture: Secondary | ICD-10-CM

## 2018-07-26 DIAGNOSIS — E785 Hyperlipidemia, unspecified: Secondary | ICD-10-CM

## 2018-07-26 DIAGNOSIS — Z23 Encounter for immunization: Secondary | ICD-10-CM

## 2018-07-26 DIAGNOSIS — I10 Essential (primary) hypertension: Secondary | ICD-10-CM | POA: Diagnosis not present

## 2018-07-26 DIAGNOSIS — Z993 Dependence on wheelchair: Secondary | ICD-10-CM

## 2018-07-26 DIAGNOSIS — Z170 Estrogen receptor positive status [ER+]: Secondary | ICD-10-CM

## 2018-07-26 DIAGNOSIS — Z8781 Personal history of (healed) traumatic fracture: Secondary | ICD-10-CM

## 2018-07-26 DIAGNOSIS — S82201A Unspecified fracture of shaft of right tibia, initial encounter for closed fracture: Secondary | ICD-10-CM

## 2018-07-26 MED ORDER — TIZANIDINE HCL 2 MG PO TABS: 2.0000 mg | ORAL_TABLET | Freq: Two times a day (BID) | ORAL | 1 refills | Status: DC

## 2018-07-26 NOTE — Progress Notes (Signed)
Established Patient Office Visit     CC/Reason for Visit: Establish care, follow-up on chronic medical conditions  HPI: Stacy Diaz is a 67 y.o. female who is coming in today for the above mentioned reasons.  Due for annual physical in September 2020.  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  She has resultant foot drop, fractured her right tib-fib in September 2019, she is basically wheelchair dependent at this point, can transfer with assistance.  Has a history of breast cancer status post lumpectomy and radiation therapy now maintained on letrozole, hypertension, hyperlipidemia.  She comes in today with her husband.  She has no acute complaints.  Hypertension has been stable, hyperlipidemia has been stable, she has been released from the orthopedist following her right hip fracture, has completed both SNF rehab and home health rehab.  Now has a home aide that is there 5 days a week to assist with ADLs she is requesting pneumonia vaccination today.  She is chronically on Percocet for her muscle spasm/pain following her paralysis.  She was recently started on tizanidine so her prior PCP has been weaning off the methocarbamol.  She went from twice daily to once daily and at the end of December she will discontinue altogether.   Past Medical/Surgical History: Past Medical History:  Diagnosis Date  . Anxiety   . Arthritis    "probably" (08/25/2012)  . Breast cancer of lower-outer quadrant of left female breast ([place]) 05/25/2014  . Headache(784.0)    "not real frequent" (08/25/2012)  . Heart disease   . HTN (hypertension)   . Hyperlipidemia   . Meningioma ([place]) 1999  . Myocardial infarction ([place]) 1987  . 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 S/P radiation therapy 07/30/14-08/30/13   left breast cancer/50Gy  . Seizures ([place])    no  seizures since 2014  . Spastic paraparesis    S/P meningioma resection 1999/notes 08/25/2012  . Stroke ([place])    x 2, the last 2014    Past Surgical History:  Procedure Laterality Date  . BRAIN MENINGIOMA EXCISION  05/1997  . BREAST LUMPECTOMY WITH NEEDLE LOCALIZATION AND AXILLARY SENTINEL LYMPH NODE BX Left 06/22/2014   Procedure: LEFT WIRE LOCALIZED LUMPECTOPMY AND SENTINEL NODE MAPPING;  Surgeon: Autumn Messing III, MD;  Location: [facility];  Service: General;  Laterality: Left;  . CARDIAC CATHETERIZATION  1987  . CORONARY ANGIOPLASTY  1987   denied intervention: "blockage wasn't bad enough"  . TIBIA IM NAIL INSERTION Right 04/26/2018   Procedure: INTRAMEDULLARY (IM) NAIL TIBIAL;  Surgeon: Altamese [place], MD;  Location: [facility];  Service: Orthopedics;  Laterality: Right;    Social History:  reports that she has quit smoking. Her smoking use included cigarettes. She has a 20.00 pack-year smoking history. She has never used smokeless tobacco. She reports that she does not drink alcohol or use drugs.  Allergies: No Known Allergies  Family History:  Family History  Problem Relation Age of Onset  . Breast cancer Mother 29       unilateral  . Heart disease Father   . Breast cancer Paternal Grandmother 61       unilateral     Current Outpatient Medications:  .  acetaminophen (TYLENOL) 500 MG tablet, Take 1 tablet (500 mg total) by mouth every 8 (eight) hours., Disp: 90 tablet, Rfl: 0 .  atenolol (TENORMIN)  50 MG tablet, 1-1/2 tablets twice daily (Patient taking differently: Take 75 mg by mouth 2 (two) times daily. 1-1/2 tablets twice daily), Disp: 300 tablet, Rfl: 4 .  atorvastatin (LIPITOR) 10 MG tablet, Take 1 tablet (10 mg total) by mouth daily at 6 PM., Disp: 90 tablet, Rfl: 4 .  baclofen (LIORESAL) 20 MG tablet, TAKE 1 TABLET BY MOUTH 4 TIMES A DAY, Disp: 360 tablet, Rfl: 4 .  clopidogrel (PLAVIX) 75 MG tablet, TAKE 1 TABLET (75 MG TOTAL) BY MOUTH DAILY. PT NEEDS TO SCHEDULE AN OV FOR MORE  REFILLS, Disp: 30 tablet, Rfl: 1 .  diazepam (VALIUM) 5 MG tablet, Take 1 tablet (5 mg total) by mouth at bedtime., Disp: 90 tablet, Rfl: 4 .  enoxaparin (LOVENOX) 40 MG/0.4ML injection, Inject 0.4 mLs (40 mg total) into the skin daily., Disp: 14 Syringe, Rfl: 0 .  letrozole (FEMARA) 2.5 MG tablet, Take 1 tablet (2.5 mg total) by mouth daily., Disp: 90 tablet, Rfl: 3 .  lisinopril (PRINIVIL,ZESTRIL) 10 MG tablet, 1 by mouth daily at bedtime, Disp: 90 tablet, Rfl: 4 .  methocarbamol (ROBAXIN) 750 MG tablet, Take 1 tablet (750 mg total) by mouth 2 (two) times daily. (Patient taking differently: Take 750 mg by mouth daily. ), Disp: 180 tablet, Rfl: 4 .  Multiple Vitamin (MULTIVITAMIN WITH MINERALS) TABS tablet, Take 1 tablet by mouth every morning. , Disp: , Rfl:  .  OVER THE COUNTER MEDICATION, Apply 1 application topically daily as needed (pain). Aspercreme with 5% lidocaine, Disp: , Rfl:  .  Oxcarbazepine (TRILEPTAL) 300 MG tablet, TAKE 1 TABLET (300 MG TOTAL) BY MOUTH 2 (TWO) TIMES DAILY., Disp: 180 tablet, Rfl: 1 .  oxyCODONE-acetaminophen (PERCOCET) 10-325 MG tablet, Take 1 tablet by mouth 3 (three) times daily as needed for pain., Disp: 270 tablet, Rfl: 0 .  polyethylene glycol (MIRALAX / GLYCOLAX) packet, Take 17 g by mouth daily as needed (for constipation). Mix with liquid and drink, Disp: , Rfl:  .  senna (SENOKOT) 8.6 MG TABS tablet, Take 1 tablet by mouth., Disp: , Rfl:  .  tiZANidine (ZANAFLEX) 2 MG tablet, Take 1 tablet (2 mg total) by mouth 2 (two) times daily., Disp: 180 tablet, Rfl: 1 .  zinc oxide (BALMEX) 11.3 % CREA cream, Apply 1 application topically at bedtime as needed (IRRITATION). , Disp: , Rfl:   Review of Systems:  Constitutional: Denies fever, chills, diaphoresis, appetite change and fatigue.  HEENT: Denies photophobia, eye pain, redness, hearing loss, ear pain, congestion, sore throat, rhinorrhea, sneezing, mouth sores, trouble swallowing, neck pain, neck stiffness and  tinnitus.   Respiratory: Denies SOB, DOE, cough, chest tightness,  and wheezing.   Cardiovascular: Denies chest pain, palpitations and leg swelling.  Gastrointestinal: Denies nausea, vomiting, abdominal pain, diarrhea, constipation, blood in stool and abdominal distention.  Genitourinary: Denies dysuria, urgency, frequency, hematuria, flank pain and difficulty urinating.  Endocrine: Denies: hot or cold intolerance, sweats, changes in hair or nails, polyuria, polydipsia. Musculoskeletal: Denies myalgias, back pain, joint swelling, arthralgias and gait problem.  Skin: Denies pallor, rash and wound.  Neurological: Denies dizziness, seizures, syncope, weakness, light-headedness, numbness and headaches.  Hematological: Denies adenopathy. Easy bruising, personal or family bleeding history  Psychiatric/Behavioral: Denies suicidal ideation, mood changes, confusion, nervousness, sleep disturbance and agitation    Physical Exam: Vitals:   07/26/18 1550  BP: 120/84  Pulse: (!) 58  Temp: 98.7 F (37.1 C)  TempSrc: Oral  SpO2: 98%    There is no height or weight  on file to calculate BMI.   Constitutional: NAD, calm, comfortable Eyes: PERRL, lids and conjunctivae normal, wears corrective lenses ENMT: Mucous membranes are moist.  Neck: normal, supple, no masses, no thyromegaly Respiratory: clear to auscultation bilaterally, no wheezing, no crackles. Normal respiratory effort. No accessory muscle use.  Cardiovascular: Regular rate and rhythm, no murmurs / rubs / gallops. No extremity edema. 2+ pedal pulses. No carotid bruits.  Abdomen: no tenderness, no masses palpated. No hepatosplenomegaly. Bowel sounds positive.  Musculoskeletal: no clubbing / cyanosis. No joint deformity upper and lower extremities.  Decreased muscle tone, decreased range of motion, no contractures. Skin: no rashes, lesions, ulcers. No induration Neurologic: Decreased strength in bilateral lower extremities.  Upper  extremities intact Psychiatric: Normal judgment and insight. Alert and oriented x 3. Normal mood.    Impression and Plan:  Hyperlipidemia, unspecified hyperlipidemia type -On atorvastatin 10 mg. -Last LDL 79 in 2017.  Essential hypertension -Well controlled on atenolol and lisinopril (rather high dose of lisinopril :20 mg q am and 30 mg q pm). -Cr 0.53 in 9/19.  Closed fracture of right tibia and fibula, initial encounter -S/p IM nail. -Has been released from ortho.  Malignant neoplasm of lower-outer quadrant of left breast of female, estrogen receptor positive (Stacy Diaz) -S/p lumpectomy and radiation 4 years ago. -Follows with Dr. Lindi Diaz. -Due for mammogram later this month, already scheduled.  History of stroke  Paraplegia (Stacy Diaz), Chronic  -Wheelchair bound. Continue tizanidine, wean robaxin, continue percocet (she is not requesting refills today)    Patient Instructions  -It was nice meeting you today!  -You will receive your second pneumonia vaccination today.  -I will see you back in 6 months for follow-up.     Lelon Frohlich, MD [place]

## 2018-07-26 NOTE — Patient Instructions (Addendum)
-  It was nice meeting you today!  -You will receive your second pneumonia vaccination today.  -I will see you back in 6 months for follow-up.

## 2018-08-15 ENCOUNTER — Encounter: Payer: Self-pay | Admitting: Hematology and Oncology

## 2018-08-15 DIAGNOSIS — Z853 Personal history of malignant neoplasm of breast: Secondary | ICD-10-CM | POA: Diagnosis not present

## 2018-08-15 DIAGNOSIS — M810 Age-related osteoporosis without current pathological fracture: Secondary | ICD-10-CM | POA: Diagnosis not present

## 2018-08-15 DIAGNOSIS — D496 Neoplasm of unspecified behavior of brain: Secondary | ICD-10-CM | POA: Diagnosis not present

## 2018-08-15 DIAGNOSIS — G8220 Paraplegia, unspecified: Secondary | ICD-10-CM | POA: Diagnosis not present

## 2018-08-15 DIAGNOSIS — Z780 Asymptomatic menopausal state: Secondary | ICD-10-CM | POA: Diagnosis not present

## 2018-08-15 LAB — HM MAMMOGRAPHY

## 2018-08-15 LAB — HM DEXA SCAN

## 2018-08-23 ENCOUNTER — Telehealth: Payer: Self-pay

## 2018-08-23 NOTE — Progress Notes (Signed)
Received [facility] report bone density and mammogram. Forward to dr.gudena to review bone density results. MM sent to scan.

## 2018-08-23 NOTE — Telephone Encounter (Signed)
Called pt to discuss her recent bone density results. Spoke with husband [name] [facility]). PT has severe osteoporosis and Dr.Gudena reviewed results. Suggested that pt to start on prolia injections every 6 months. Pt will need to come in and see Dr.Gudena to discuss about medication and will need prior auth for injections. Appt made for this Thursday 08/25/2018 @ 10am. Husband confirmed time and date to see MD. Will add labs after appt with MD if pt agreeable to injection treatment.

## 2018-08-24 ENCOUNTER — Encounter: Payer: Self-pay | Admitting: Hematology and Oncology

## 2018-08-25 ENCOUNTER — Other Ambulatory Visit: Payer: Self-pay | Admitting: *Deleted

## 2018-08-25 ENCOUNTER — Inpatient Hospital Stay: Payer: PPO

## 2018-08-25 ENCOUNTER — Encounter: Payer: Self-pay | Admitting: Internal Medicine

## 2018-08-25 ENCOUNTER — Inpatient Hospital Stay: Payer: PPO | Admitting: Hematology and Oncology

## 2018-08-25 MED ORDER — CLOPIDOGREL BISULFATE 75 MG PO TABS: 75.0000 mg | ORAL_TABLET | Freq: Every day | ORAL | 1 refills | Status: DC

## 2018-08-26 ENCOUNTER — Other Ambulatory Visit: Payer: Self-pay | Admitting: *Deleted

## 2018-08-26 MED ORDER — DIAZEPAM 5 MG PO TABS: 5.0000 mg | ORAL_TABLET | Freq: Every day | ORAL | 1 refills | Status: DC

## 2018-09-02 ENCOUNTER — Encounter: Payer: Self-pay | Admitting: Internal Medicine

## 2018-09-02 DIAGNOSIS — M810 Age-related osteoporosis without current pathological fracture: Secondary | ICD-10-CM | POA: Insufficient documentation

## 2018-09-06 DIAGNOSIS — S82251D Displaced comminuted fracture of shaft of right tibia, subsequent encounter for closed fracture with routine healing: Secondary | ICD-10-CM | POA: Diagnosis not present

## 2018-09-09 ENCOUNTER — Other Ambulatory Visit: Payer: Self-pay

## 2018-09-09 DIAGNOSIS — C50512 Malignant neoplasm of lower-outer quadrant of left female breast: Secondary | ICD-10-CM

## 2018-09-09 DIAGNOSIS — Z170 Estrogen receptor positive status [ER+]: Secondary | ICD-10-CM

## 2018-09-12 ENCOUNTER — Inpatient Hospital Stay: Payer: PPO

## 2018-09-12 ENCOUNTER — Inpatient Hospital Stay: Payer: PPO | Attending: Hematology and Oncology | Admitting: Hematology and Oncology

## 2018-09-12 ENCOUNTER — Telehealth: Payer: Self-pay | Admitting: Hematology and Oncology

## 2018-09-12 VITALS — BP 153/75 | HR 63 | Temp 98.2°F | Resp 18 | Ht 62.01 in

## 2018-09-12 DIAGNOSIS — Z923 Personal history of irradiation: Secondary | ICD-10-CM | POA: Diagnosis not present

## 2018-09-12 DIAGNOSIS — Z79811 Long term (current) use of aromatase inhibitors: Secondary | ICD-10-CM | POA: Diagnosis not present

## 2018-09-12 DIAGNOSIS — Z170 Estrogen receptor positive status [ER+]: Secondary | ICD-10-CM

## 2018-09-12 DIAGNOSIS — C50512 Malignant neoplasm of lower-outer quadrant of left female breast: Secondary | ICD-10-CM

## 2018-09-12 DIAGNOSIS — M810 Age-related osteoporosis without current pathological fracture: Secondary | ICD-10-CM

## 2018-09-12 DIAGNOSIS — M2550 Pain in unspecified joint: Secondary | ICD-10-CM

## 2018-09-12 DIAGNOSIS — M8000XA Age-related osteoporosis with current pathological fracture, unspecified site, initial encounter for fracture: Secondary | ICD-10-CM

## 2018-09-12 DIAGNOSIS — M791 Myalgia, unspecified site: Secondary | ICD-10-CM | POA: Diagnosis not present

## 2018-09-12 LAB — CBC WITH DIFFERENTIAL (CANCER CENTER ONLY)
ABS IMMATURE GRANULOCYTES: 0.01 10*3/uL (ref 0.00–0.07)
BASOS PCT: 1 %
Basophils Absolute: 0.1 10*3/uL (ref 0.0–0.1)
Eosinophils Absolute: 0.1 10*3/uL (ref 0.0–0.5)
Eosinophils Relative: 1 %
HCT: 45.4 % (ref 36.0–46.0)
HEMOGLOBIN: 15.1 g/dL — AB (ref 12.0–15.0)
IMMATURE GRANULOCYTES: 0 %
Lymphocytes Relative: 22 %
Lymphs Abs: 2.3 10*3/uL (ref 0.7–4.0)
MCH: 29.6 pg (ref 26.0–34.0)
MCHC: 33.3 g/dL (ref 30.0–36.0)
MCV: 89 fL (ref 80.0–100.0)
MONO ABS: 0.8 10*3/uL (ref 0.1–1.0)
MONOS PCT: 7 %
NEUTROS ABS: 7.2 10*3/uL (ref 1.7–7.7)
NEUTROS PCT: 69 %
PLATELETS: 221 10*3/uL (ref 150–400)
RBC: 5.1 MIL/uL (ref 3.87–5.11)
RDW: 12.9 % (ref 11.5–15.5)
WBC: 10.4 10*3/uL (ref 4.0–10.5)
nRBC: 0 % (ref 0.0–0.2)

## 2018-09-12 LAB — CMP (CANCER CENTER ONLY)
ALBUMIN: 4.6 g/dL (ref 3.5–5.0)
ALT: 26 U/L (ref 0–44)
AST: 22 U/L (ref 15–41)
Alkaline Phosphatase: 102 U/L (ref 38–126)
Anion gap: 9 (ref 5–15)
BILIRUBIN TOTAL: 0.5 mg/dL (ref 0.3–1.2)
BUN: 12 mg/dL (ref 8–23)
CHLORIDE: 99 mmol/L (ref 98–111)
CO2: 25 mmol/L (ref 22–32)
Calcium: 9.8 mg/dL (ref 8.9–10.3)
Creatinine: 0.68 mg/dL (ref 0.44–1.00)
GFR, Est AFR Am: >60 mL/min (ref >60)
GFR, Estimated: >60 mL/min (ref >60)
GLUCOSE: 96 mg/dL (ref 70–99)
POTASSIUM: 4.4 mmol/L (ref 3.5–5.1)
Sodium: 133 mmol/L — ABNORMAL LOW (ref 135–145)
TOTAL PROTEIN: 7.8 g/dL (ref 6.5–8.1)

## 2018-09-12 MED ORDER — DENOSUMAB 60 MG/ML ~~LOC~~ SOSY
60.0000 mg | PREFILLED_SYRINGE | Freq: Once | SUBCUTANEOUS | Status: AC
  Administered 2018-09-12: 60 mg via SUBCUTANEOUS

## 2018-09-12 MED ORDER — DENOSUMAB 60 MG/ML ~~LOC~~ SOSY
PREFILLED_SYRINGE | SUBCUTANEOUS | Status: AC
  Filled 2018-09-12: qty 1

## 2018-09-12 NOTE — Telephone Encounter (Signed)
Gave avs and calendar ° °

## 2018-09-12 NOTE — Progress Notes (Signed)
Consent obtained for Prolia.  Patient taking MTV - okay per Dr. Lindi Adie for this to be calcium supplement while on Prolia.  Pharmacy notified - no prior authorization needed prior to start.

## 2018-09-12 NOTE — Progress Notes (Signed)
Patient Care Team: Isaac Bliss, Rayford Halsted, MD as PCP - General (Internal Medicine) Jovita Kussmaul, MD as Consulting Physician (General Surgery) Nicholas Lose, MD as Consulting Physician (Hematology and Oncology) Kyung Rudd, MD as Consulting Physician (Radiation Oncology) Daneen Schick as [facility]  DIAGNOSIS:  Encounter Diagnosis  Name Primary?  . Malignant neoplasm of lower-outer quadrant of left breast of female, estrogen receptor positive ([place])     SUMMARY OF ONCOLOGIC HISTORY:   Breast cancer of lower-outer quadrant of left female breast ([place])   05/21/2014 Mammogram    Mammogram and Ultrasound: Left breast 2 cm complex cystic asymmetric abnormality    05/25/2014 Initial Diagnosis    Invasive mammary cancer with lobular features with perineural invasion, grade 2, ER positive PR positive HER-2 negative, Ki-67 25%    06/22/2014 Surgery    Breast lumpectomy: Invasive lobular carcinoma, grade 3, 2.7 cm, with LCIS, ER positive, PR positive, HER-2 negative, Ki-67 65%, T2, N0, M0 stage II A3 SLN negative; oncotype 16 low risk 10% ROR    07/30/2014 - 08/28/2014 Radiation Therapy    Adjuvant radiation    09/07/2014 -  Anti-estrogen oral therapy    Anastrazole 1 mg daily switch to letrozole 06/25/2015 for myalgias    06/02/2016 - 06/05/2016 Hospital Admission    TIA/CVA/seizures     CHIEF COMPLIANT: Annual surveillance of breast cancer on letrozole therapy, fracture September 2019  INTERVAL HISTORY: Stacy Diaz is a 68 year old with above-mentioned history of invasive lobular cancer who underwent lumpectomy radiation is currently on oral antiestrogen therapy.  She is tolerating letrozole reasonably well.  She continues to have arthralgias myalgias which could be unrelated to the antiestrogen therapy.  She had her foot caught on the door and twisted her leg and heard it crack.  She had a fracture that required rod placement and surgery.  She went  to rehab for 6 weeks and still continues to have pain and discomfort.  Bone density test was performed which revealed osteoporosis.  REVIEW OF SYSTEMS:   Constitutional: Denies fevers, chills or abnormal weight loss Eyes: Denies blurriness of vision Ears, nose, mouth, throat, and face: Denies mucositis or sore throat Respiratory: Denies cough, dyspnea or wheezes Cardiovascular: Denies palpitation, chest discomfort Gastrointestinal:  Denies nausea, heartburn or change in bowel habits Skin: Denies abnormal skin rashes Lymphatics: Denies new lymphadenopathy or easy bruising Neurological:Denies numbness, tingling or new weaknesses Behavioral/Psych: Mood is stable, no new changes  Extremities: Pain in the right lower extremity Breast:  denies any pain or lumps or nodules in either breasts All other systems were reviewed with the patient and are negative.  I have reviewed the past medical history, past surgical history, social history and family history with the patient and they are unchanged from previous note.  ALLERGIES:  has No Known Allergies.  MEDICATIONS:  Current Outpatient Medications  Medication Sig Dispense Refill  . acetaminophen (TYLENOL) 500 MG tablet Take 1 tablet (500 mg total) by mouth every 8 (eight) hours. 90 tablet 0  . atenolol (TENORMIN) 50 MG tablet 1-1/2 tablets twice daily (Patient taking differently: Take 75 mg by mouth 2 (two) times daily. 1-1/2 tablets twice daily) 300 tablet 4  . atorvastatin (LIPITOR) 10 MG tablet Take 1 tablet (10 mg total) by mouth daily at 6 PM. 90 tablet 4  . baclofen (LIORESAL) 20 MG tablet TAKE 1 TABLET BY MOUTH 4 TIMES A DAY 360 tablet 4  . clopidogrel (PLAVIX) 75 MG tablet Take 1 tablet (  75 mg total) by mouth daily. 90 tablet 1  . diazepam (VALIUM) 5 MG tablet Take 1 tablet (5 mg total) by mouth at bedtime. 90 tablet 1  . letrozole (FEMARA) 2.5 MG tablet Take 1 tablet (2.5 mg total) by mouth daily. 90 tablet 3  . lisinopril  (PRINIVIL,ZESTRIL) 10 MG tablet 1 by mouth daily at bedtime 90 tablet 4  . Multiple Vitamin (MULTIVITAMIN WITH MINERALS) TABS tablet Take 1 tablet by mouth every morning.     Marland Kitchen OVER THE COUNTER MEDICATION Apply 1 application topically daily as needed (pain). Aspercreme with 5% lidocaine    . Oxcarbazepine (TRILEPTAL) 300 MG tablet TAKE 1 TABLET (300 MG TOTAL) BY MOUTH 2 (TWO) TIMES DAILY. 180 tablet 1  . oxyCODONE-acetaminophen (PERCOCET) 10-325 MG tablet Take 1 tablet by mouth 3 (three) times daily as needed for pain. 270 tablet 0  . polyethylene glycol (MIRALAX / GLYCOLAX) packet Take 17 g by mouth daily as needed (for constipation). Mix with liquid and drink    . senna (SENOKOT) 8.6 MG TABS tablet Take 1 tablet by mouth.    Marland Kitchen tiZANidine (ZANAFLEX) 2 MG tablet Take 1 tablet (2 mg total) by mouth 2 (two) times daily. 180 tablet 1  . zinc oxide (BALMEX) 11.3 % CREA cream Apply 1 application topically at bedtime as needed (IRRITATION).      No current facility-administered medications for this visit.     PHYSICAL EXAMINATION: ECOG PERFORMANCE STATUS: 0 - Asymptomatic  Vitals:   09/12/18 1400  BP: (!) 153/75  Pulse: 63  Resp: 18  Temp: 98.2 F (36.8 C)  SpO2: 100%   Filed Weights    GENERAL:alert, no distress and comfortable SKIN: skin color, texture, turgor are normal, no rashes or significant lesions EYES: normal, Conjunctiva are pink and non-injected, sclera clear OROPHARYNX:no exudate, no erythema and lips, buccal mucosa, and tongue normal  NECK: supple, thyroid normal size, non-tender, without nodularity LYMPH:  no palpable lymphadenopathy in the cervical, axillary or inguinal LUNGS: clear to auscultation and percussion with normal breathing effort HEART: regular rate & rhythm and no murmurs and no lower extremity edema ABDOMEN:abdomen soft, non-tender and normal bowel sounds MUSCULOSKELETAL:no cyanosis of digits and no clubbing  NEURO: alert & oriented x 3 with fluent  speech, no focal motor/sensory deficits EXTREMITIES: No lower extremity edema BREAST: No palpable masses or nodules in either right or left breasts. No palpable axillary supraclavicular or infraclavicular adenopathy no breast tenderness or nipple discharge. (exam performed in the presence of a chaperone)  LABORATORY DATA:  I have reviewed the data as listed CMP Latest Ref Rng & Units 04/28/2018 04/28/2018 04/27/2018  Glucose 70 - 99 mg/dL 120(H) - 113(H)  BUN 8 - 23 mg/dL 6(L) - 8  Creatinine 0.44 - 1.00 mg/dL 0.53 - 0.59  Sodium 135 - 145 mmol/L 134(L) - 135  Potassium 3.5 - 5.1 mmol/L 4.0 - 4.3  Chloride 98 - 111 mmol/L 103 - 102  CO2 22 - 32 mmol/L 23 - 21(L)  Calcium 8.7 - 10.3 mg/dL 8.9 8.9 8.8(L)  Total Protein 6.5 - 8.1 g/dL 6.5 - -  Total Bilirubin 0.3 - 1.2 mg/dL 1.0 - -  Alkaline Phos 38 - 126 U/L 65 - -  AST 15 - 41 U/L 44(H) - -  ALT 0 - 44 U/L 38 - -    Lab Results  Component Value Date   WBC 10.4 09/12/2018   HGB 15.1 (H) 09/12/2018   HCT 45.4 09/12/2018   MCV  89.0 09/12/2018   PLT 221 09/12/2018   NEUTROABS 7.2 09/12/2018    ASSESSMENT & PLAN:  Breast cancer of lower-outer quadrant of left female breast (HCC) 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 Oncotype DX recurrence score 16; 10% risk of recurrence, low risk did not need chemotherapy status post radiation therapy completed January 2016, started antiestrogen therapy with Arimidex 1 mg daily 09/07/2014 Switched to letrozole November 2016.  letrozole toxicities: Patient has chronic muscle stiffness and aches. These symptoms are much better on letrozole then on anastrozole.  Seizures/CVA: Hospitalization October 2017 Myalgias and arthralgias: Continues to be a problem Fracture of the left leg with surgery and rod implantation: Causing significant pain and discomfort September 2019.  Osteoporosis: T score -3.5: I  recommended starting the patient on Prolia every 6 months along with calcium and vitamin D.  Breast Cancer Surveillance: 1. Breast exam: 09/12/2018 scar tissue was palpated and no concerns were identified  2. Mammogram12/30/2019 No abnormalities. Postsurgical changes. Breast density category C.   Return to clinic in 1 year for follow-up and every 6 months for Prolia injections with labs.    No orders of the defined types were placed in this encounter.  The patient has a good understanding of the overall plan. she agrees with it. she will call with any problems that may develop before the next visit here.   Harriette Ohara, MD 09/12/18

## 2018-09-12 NOTE — Assessment & Plan Note (Signed)
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 Oncotype DX recurrence score 16; 10% risk of recurrence, low risk did not need chemotherapy status post radiation therapy completed January 2016, started antiestrogen therapy with Arimidex 1 mg daily 09/07/2014 Switched to letrozole November 2016.  letrozole toxicities: Patient has chronic muscle stiffness and aches. These symptoms are much better on letrozole then on anastrozole.  Seizures/CVA: Hospitalization October 2017 Myalgias and arthralgias: tried tonic water  Breast Cancer Surveillance: 1. Breast exam: 09/12/2018 scar tissue was palpated and no concerns were identified  2. Mammogram12/30/2019 No abnormalities. Postsurgical changes. Breast density category C.    Return to clinic in 1 year for follow-up

## 2018-09-14 ENCOUNTER — Encounter: Payer: Self-pay | Admitting: Internal Medicine

## 2018-09-21 ENCOUNTER — Encounter: Payer: Self-pay | Admitting: Internal Medicine

## 2018-09-21 DIAGNOSIS — I10 Essential (primary) hypertension: Secondary | ICD-10-CM

## 2018-09-22 MED ORDER — OXYCODONE-ACETAMINOPHEN 10-325 MG PO TABS: 1.0000 | ORAL_TABLET | Freq: Three times a day (TID) | ORAL | 0 refills | Status: DC | PRN

## 2018-09-22 MED ORDER — LISINOPRIL 10 MG PO TABS: ORAL_TABLET | ORAL | 1 refills | Status: DC

## 2018-09-23 ENCOUNTER — Other Ambulatory Visit: Payer: Self-pay | Admitting: Internal Medicine

## 2018-09-23 MED ORDER — OXYCODONE-ACETAMINOPHEN 10-325 MG PO TABS: 1.0000 | ORAL_TABLET | Freq: Three times a day (TID) | ORAL | 0 refills | Status: DC | PRN

## 2018-09-23 NOTE — Telephone Encounter (Signed)
New Rx sent to [facility] and Rx at [facility] cancelled.

## 2018-09-23 NOTE — Telephone Encounter (Signed)
Copied from [place] [id number]. Topic: Quick Communication - Rx Refill/Question >> Sep 23, 2018 10:34 AM Reyne Dumas L wrote: Medication:  oxyCODONE-acetaminophen (PERCOCET) 10-325 MG tablet   Pt's spouse, [name], calling in:  See Mychart message from 09/21/2018 - states this was supposed to go to [facility] and instead it was called into [facility].  Please resend to correct pharmacy. [name] can be reached at [phone]  Preferred Pharmacy (with phone number or street name): [facility] # [id number], [place] [phone] (Phone) [phone] (Fax)  Agent: Please be advised that RX refills may take up to 3 business days. We ask that you follow-up with your pharmacy.

## 2018-10-27 DIAGNOSIS — I6789 Other cerebrovascular disease: Secondary | ICD-10-CM | POA: Diagnosis not present

## 2018-10-27 DIAGNOSIS — D332 Benign neoplasm of brain, unspecified: Secondary | ICD-10-CM | POA: Diagnosis not present

## 2018-10-27 DIAGNOSIS — I6350 Cerebral infarction due to unspecified occlusion or stenosis of unspecified cerebral artery: Secondary | ICD-10-CM | POA: Diagnosis not present

## 2018-10-27 DIAGNOSIS — G8320 Monoplegia of upper limb affecting unspecified side: Secondary | ICD-10-CM | POA: Diagnosis not present

## 2018-10-27 DIAGNOSIS — R569 Unspecified convulsions: Secondary | ICD-10-CM | POA: Diagnosis not present

## 2018-11-18 ENCOUNTER — Telehealth: Payer: Self-pay

## 2018-11-18 NOTE — Telephone Encounter (Signed)
Mychart message sent to assess interest in AWV. Awaiting reply.

## 2018-12-16 ENCOUNTER — Other Ambulatory Visit: Payer: Self-pay | Admitting: Internal Medicine

## 2018-12-21 ENCOUNTER — Ambulatory Visit (INDEPENDENT_AMBULATORY_CARE_PROVIDER_SITE_OTHER): Payer: PPO | Admitting: Internal Medicine

## 2018-12-21 ENCOUNTER — Other Ambulatory Visit: Payer: Self-pay

## 2018-12-21 DIAGNOSIS — C50919 Malignant neoplasm of unspecified site of unspecified female breast: Secondary | ICD-10-CM | POA: Diagnosis not present

## 2018-12-21 DIAGNOSIS — M8000XA Age-related osteoporosis with current pathological fracture, unspecified site, initial encounter for fracture: Secondary | ICD-10-CM

## 2018-12-21 DIAGNOSIS — J302 Other seasonal allergic rhinitis: Secondary | ICD-10-CM | POA: Diagnosis not present

## 2018-12-21 DIAGNOSIS — E785 Hyperlipidemia, unspecified: Secondary | ICD-10-CM | POA: Diagnosis not present

## 2018-12-21 DIAGNOSIS — I10 Essential (primary) hypertension: Secondary | ICD-10-CM | POA: Diagnosis not present

## 2018-12-21 DIAGNOSIS — G8220 Paraplegia, unspecified: Secondary | ICD-10-CM

## 2018-12-21 MED ORDER — OXYCODONE-ACETAMINOPHEN 10-325 MG PO TABS: 1.0000 | ORAL_TABLET | Freq: Three times a day (TID) | ORAL | 0 refills | Status: AC | PRN

## 2018-12-21 NOTE — Progress Notes (Signed)
Virtual Visit via Video Note  I connected with Stacy Diaz on 12/21/18 at  2:00 PM EDT by a video enabled telemedicine application and verified that I am speaking with the correct person using two identifiers.  Location patient: home Location provider: work office Persons participating in the virtual visit: patient, provider  I discussed the limitations of evaluation and management by telemedicine and the availability of in person appointments. The patient expressed understanding and agreed to proceed.   HPI: This is a scheduled visit for follow-up of chronic conditions and medication refills.  Her hypertension has been well controlled, just today her blood pressure was 128/72, she saw her oncologist for breast cancer and osteoporosis and was started on Prolia, got her first injection in December 2019.  She remains on statin for hyperlipidemia.  In the past couple weeks she has had an increase in runny nose and congested nose as well as postnasal drip and is started back taking Zyrtec, she suffers from seasonal allergies and on the springtime.  Unfortunately her caregiver tested positive for COVID-19 7 days ago and she and her husband are in a 14-day mandatory quarantine.  This is day 8 and they have thankfully not developed any symptoms.  She needs refills of her oxycodone today that she takes for chronic pain, she is paraplegic, she needs a prescription signed to have her wheelchair fixed.   ROS: Constitutional: Denies fever, chills, diaphoresis, appetite change and fatigue.  HEENT: Denies photophobia, eye pain, redness, hearing loss, ear pain,  mouth sores, trouble swallowing, neck pain, neck stiffness and tinnitus.   Respiratory: Denies SOB, DOE, cough, chest tightness,  and wheezing.   Cardiovascular: Denies chest pain, palpitations and leg swelling.  Gastrointestinal: Denies nausea, vomiting, abdominal pain, diarrhea, constipation, blood in stool and abdominal distention.   Genitourinary: Denies dysuria, urgency, frequency, hematuria, flank pain and difficulty urinating.  Endocrine: Denies: hot or cold intolerance, sweats, changes in hair or nails, polyuria, polydipsia. Musculoskeletal: Denies myalgias, back pain, joint swelling, arthralgias and gait problem.  Skin: Denies pallor, rash and wound.  Neurological: Denies dizziness, seizures, syncope, weakness, light-headedness, numbness and headaches.  Hematological: Denies adenopathy. Easy bruising, personal or family bleeding history  Psychiatric/Behavioral: Denies suicidal ideation, mood changes, confusion, nervousness, sleep disturbance and agitation   Past Medical History:  Diagnosis Date  . Anxiety   . Arthritis    "probably" (08/25/2012)  . Breast cancer of lower-outer quadrant of left female breast ([place]) 05/25/2014  . Headache(784.0)    "not real frequent" (08/25/2012)  . Heart disease   . HTN (hypertension)   . Hyperlipidemia   . Meningioma ([place]) 1999  . Myocardial infarction ([place]) 1987  . 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 S/P radiation therapy 07/30/14-08/30/13   left breast cancer/50Gy  . Seizures ([place])    no seizures since 2014  . Spastic paraparesis    S/P meningioma resection 1999/notes 08/25/2012  . Stroke ([place])    x 2, the last 2014    Past Surgical History:  Procedure Laterality Date  . BRAIN MENINGIOMA EXCISION  05/1997  . BREAST LUMPECTOMY WITH NEEDLE LOCALIZATION AND AXILLARY SENTINEL LYMPH NODE BX Left 06/22/2014   Procedure: LEFT WIRE LOCALIZED LUMPECTOPMY AND SENTINEL NODE MAPPING;  Surgeon: Autumn Messing III, MD;  Location: [facility];  Service: General;  Laterality: Left;  . CARDIAC CATHETERIZATION  1987  . CORONARY ANGIOPLASTY  1987   denied intervention: "blockage wasn't bad enough"  . TIBIA IM  NAIL INSERTION Right 04/26/2018   Procedure: INTRAMEDULLARY (IM) NAIL TIBIAL;  Surgeon: Altamese [place], MD;  Location: [facility];  Service: Orthopedics;  Laterality: Right;     Family History  Problem Relation Age of Onset  . Breast cancer Mother 24       unilateral  . Heart disease Father   . Breast cancer Paternal Grandmother 42       unilateral    SOCIAL HX:   reports that she has quit smoking. Her smoking use included cigarettes. She has a 20.00 pack-year smoking history. She has never used smokeless tobacco. She reports that she does not drink alcohol or use drugs.   Current Outpatient Medications:  .  acetaminophen (TYLENOL) 500 MG tablet, Take 1 tablet (500 mg total) by mouth every 8 (eight) hours., Disp: 90 tablet, Rfl: 0 .  atenolol (TENORMIN) 50 MG tablet, 1-1/2 tablets twice daily (Patient taking differently: Take 75 mg by mouth 2 (two) times daily. 1-1/2 tablets twice daily), Disp: 300 tablet, Rfl: 4 .  atorvastatin (LIPITOR) 10 MG tablet, Take 1 tablet (10 mg total) by mouth daily at 6 PM., Disp: 90 tablet, Rfl: 4 .  baclofen (LIORESAL) 20 MG tablet, TAKE 1 TABLET BY MOUTH 4 TIMES A DAY, Disp: 360 tablet, Rfl: 4 .  clopidogrel (PLAVIX) 75 MG tablet, Take 1 tablet (75 mg total) by mouth daily., Disp: 90 tablet, Rfl: 1 .  diazepam (VALIUM) 5 MG tablet, Take 1 tablet (5 mg total) by mouth at bedtime., Disp: 90 tablet, Rfl: 1 .  letrozole (FEMARA) 2.5 MG tablet, Take 1 tablet (2.5 mg total) by mouth daily., Disp: 90 tablet, Rfl: 3 .  lisinopril (PRINIVIL,ZESTRIL) 10 MG tablet, 1 by mouth daily at bedtime, Disp: 90 tablet, Rfl: 1 .  Multiple Vitamin (MULTIVITAMIN WITH MINERALS) TABS tablet, Take 1 tablet by mouth every morning. , Disp: , Rfl:  .  OVER THE COUNTER MEDICATION, Apply 1 application topically daily as needed (pain). Aspercreme with 5% lidocaine, Disp: , Rfl:  .  Oxcarbazepine (TRILEPTAL) 300 MG tablet, TAKE 1 TABLET (300 MG TOTAL) BY MOUTH 2 (TWO) TIMES DAILY., Disp: 180 tablet, Rfl: 1 .  oxyCODONE-acetaminophen (PERCOCET) 10-325 MG tablet, Take 1 tablet by mouth 3 (three) times daily as needed for pain., Disp: 270 tablet, Rfl: 0 .   polyethylene glycol (MIRALAX / GLYCOLAX) packet, Take 17 g by mouth daily as needed (for constipation). Mix with liquid and drink, Disp: , Rfl:  .  senna (SENOKOT) 8.6 MG TABS tablet, Take 1 tablet by mouth., Disp: , Rfl:  .  tiZANidine (ZANAFLEX) 2 MG tablet, Take 1 tablet (2 mg total) by mouth 2 (two) times daily., Disp: 180 tablet, Rfl: 1 .  zinc oxide (BALMEX) 11.3 % CREA cream, Apply 1 application topically at bedtime as needed (IRRITATION). , Disp: , Rfl:   EXAM:   VITALS per patient if applicable: Blood pressure of 128/72 reported today by patient  GENERAL: alert, oriented, appears well and in no acute distress  HEENT: atraumatic, conjunttiva clear, no obvious abnormalities on inspection of external nose and ears  NECK: normal movements of the head and neck  LUNGS: on inspection no signs of respiratory distress, breathing rate appears normal, no obvious gross increased work of breathing, gasping or wheezing  CV: no obvious cyanosis  MS: moves all visible extremities without noticeable abnormality  PSYCH/NEURO: pleasant and cooperative, no obvious depression or anxiety, speech and thought processing grossly intact  ASSESSMENT AND PLAN:   Hyperlipidemia, unspecified hyperlipidemia  type -Continue Lipitor 10 mg, his most recent LDL was 79 in October 2017.  Essential hypertension -Well-controlled on current regimen  Paraplegia (HCC) -We will refill oxycodone today. -PDMP has been reviewed in EPIC, she has no red flags, her overdose risk score is 310. -I am her only controlled substance prescriber. -Will need to sign pain contract at next in-person office visit.  Seasonal allergies -Continue daily antihistamine use.  Age-related osteoporosis with current pathological fracture, initial encounter -He has been started on Prolia by oncology.  Malignant neoplasm of female breast, unspecified estrogen receptor status, unspecified laterality, unspecified site of breast ([place])  -Followed by oncology.     I discussed the assessment and treatment plan with the patient. The patient was provided an opportunity to ask questions and all were answered. The patient agreed with the plan and demonstrated an understanding of the instructions.   The patient was advised to call back or seek an in-person evaluation if the symptoms worsen or if the condition fails to improve as anticipated.    Lelon Frohlich, MD  [facility] at [facility]

## 2019-01-12 ENCOUNTER — Encounter: Payer: Self-pay | Admitting: Internal Medicine

## 2019-01-12 DIAGNOSIS — G8220 Paraplegia, unspecified: Secondary | ICD-10-CM

## 2019-01-13 MED ORDER — TIZANIDINE HCL 2 MG PO TABS: 2.0000 mg | ORAL_TABLET | Freq: Two times a day (BID) | ORAL | 1 refills | Status: DC

## 2019-01-25 ENCOUNTER — Ambulatory Visit: Payer: PPO | Admitting: Internal Medicine

## 2019-02-16 ENCOUNTER — Other Ambulatory Visit: Payer: Self-pay | Admitting: Internal Medicine

## 2019-02-23 ENCOUNTER — Other Ambulatory Visit: Payer: Self-pay | Admitting: *Deleted

## 2019-02-23 MED ORDER — ATENOLOL 50 MG PO TABS: ORAL_TABLET | ORAL | 1 refills | Status: DC

## 2019-03-07 ENCOUNTER — Encounter: Payer: Self-pay | Admitting: Internal Medicine

## 2019-03-07 ENCOUNTER — Other Ambulatory Visit: Payer: Self-pay | Admitting: Internal Medicine

## 2019-03-07 DIAGNOSIS — G8220 Paraplegia, unspecified: Secondary | ICD-10-CM

## 2019-03-07 DIAGNOSIS — R569 Unspecified convulsions: Secondary | ICD-10-CM

## 2019-03-07 MED ORDER — OXCARBAZEPINE 300 MG PO TABS: 300.0000 mg | ORAL_TABLET | Freq: Two times a day (BID) | ORAL | 1 refills | Status: DC

## 2019-03-07 MED ORDER — DIAZEPAM 5 MG PO TABS: 5.0000 mg | ORAL_TABLET | Freq: Every day | ORAL | 1 refills | Status: DC

## 2019-03-13 ENCOUNTER — Ambulatory Visit: Payer: PPO

## 2019-03-13 ENCOUNTER — Inpatient Hospital Stay: Payer: PPO

## 2019-03-13 ENCOUNTER — Other Ambulatory Visit: Payer: Self-pay

## 2019-03-13 ENCOUNTER — Inpatient Hospital Stay: Payer: PPO | Attending: Hematology and Oncology

## 2019-03-13 VITALS — BP 128/62 | HR 62 | Temp 98.2°F | Resp 18

## 2019-03-13 DIAGNOSIS — M8000XA Age-related osteoporosis with current pathological fracture, unspecified site, initial encounter for fracture: Secondary | ICD-10-CM

## 2019-03-13 DIAGNOSIS — Z170 Estrogen receptor positive status [ER+]: Secondary | ICD-10-CM

## 2019-03-13 DIAGNOSIS — C50512 Malignant neoplasm of lower-outer quadrant of left female breast: Secondary | ICD-10-CM | POA: Diagnosis not present

## 2019-03-13 DIAGNOSIS — M810 Age-related osteoporosis without current pathological fracture: Secondary | ICD-10-CM | POA: Diagnosis not present

## 2019-03-13 LAB — CMP (CANCER CENTER ONLY)
ALT: 19 U/L (ref 0–44)
AST: 19 U/L (ref 15–41)
Albumin: 4.4 g/dL (ref 3.5–5.0)
Alkaline Phosphatase: 52 U/L (ref 38–126)
Anion gap: 9 (ref 5–15)
BUN: 12 mg/dL (ref 8–23)
CO2: 23 mmol/L (ref 22–32)
Calcium: 9.3 mg/dL (ref 8.9–10.3)
Chloride: 98 mmol/L (ref 98–111)
Creatinine: 0.68 mg/dL (ref 0.44–1.00)
GFR, Est AFR Am: >60 mL/min (ref >60)
GFR, Estimated: >60 mL/min (ref >60)
Glucose, Bld: 104 mg/dL — ABNORMAL HIGH (ref 70–99)
Potassium: 4.2 mmol/L (ref 3.5–5.1)
Sodium: 130 mmol/L — ABNORMAL LOW (ref 135–145)
Total Bilirubin: 0.4 mg/dL (ref 0.3–1.2)
Total Protein: 7.5 g/dL (ref 6.5–8.1)

## 2019-03-13 MED ORDER — DENOSUMAB 60 MG/ML ~~LOC~~ SOSY
60.0000 mg | PREFILLED_SYRINGE | Freq: Once | SUBCUTANEOUS | Status: AC
  Administered 2019-03-13: 60 mg via SUBCUTANEOUS

## 2019-03-13 MED ORDER — DENOSUMAB 60 MG/ML ~~LOC~~ SOSY
PREFILLED_SYRINGE | SUBCUTANEOUS | Status: AC
  Filled 2019-03-13: qty 1

## 2019-03-13 NOTE — Patient Instructions (Signed)
Denosumab injection What is this medicine? DENOSUMAB (den oh sue mab) slows bone breakdown. Prolia is used to treat osteoporosis in women after menopause and in men, and in people who are taking corticosteroids for 6 months or more. Xgeva is used to treat a high calcium level due to cancer and to prevent bone fractures and other bone problems caused by multiple myeloma or cancer bone metastases. Xgeva is also used to treat giant cell tumor of the bone. This medicine may be used for other purposes; ask your health care provider or pharmacist if you have questions. COMMON BRAND NAME(S): Prolia, XGEVA What should I tell my health care provider before I take this medicine? They need to know if you have any of these conditions:  dental disease  having surgery or tooth extraction  infection  kidney disease  low levels of calcium or Vitamin D in the blood  malnutrition  on hemodialysis  skin conditions or sensitivity  thyroid or parathyroid disease  an unusual reaction to denosumab, other medicines, foods, dyes, or preservatives  pregnant or trying to get pregnant  breast-feeding How should I use this medicine? This medicine is for injection under the skin. It is given by a health care professional in a hospital or clinic setting. A special MedGuide will be given to you before each treatment. Be sure to read this information carefully each time. For Prolia, talk to your pediatrician regarding the use of this medicine in children. Special care may be needed. For Xgeva, talk to your pediatrician regarding the use of this medicine in children. While this drug may be prescribed for children as young as 13 years for selected conditions, precautions do apply. Overdosage: If you think you have taken too much of this medicine contact a poison control center or emergency room at once. NOTE: This medicine is only for you. Do not share this medicine with others. What if I miss a dose? It is  important not to miss your dose. Call your doctor or health care professional if you are unable to keep an appointment. What may interact with this medicine? Do not take this medicine with any of the following medications:  other medicines containing denosumab This medicine may also interact with the following medications:  medicines that lower your chance of fighting infection  steroid medicines like prednisone or cortisone This list may not describe all possible interactions. Give your health care provider a list of all the medicines, herbs, non-prescription drugs, or dietary supplements you use. Also tell them if you smoke, drink alcohol, or use illegal drugs. Some items may interact with your medicine. What should I watch for while using this medicine? Visit your doctor or health care professional for regular checks on your progress. Your doctor or health care professional may order blood tests and other tests to see how you are doing. Call your doctor or health care professional for advice if you get a fever, chills or sore throat, or other symptoms of a cold or flu. Do not treat yourself. This drug may decrease your body's ability to fight infection. Try to avoid being around people who are sick. You should make sure you get enough calcium and vitamin D while you are taking this medicine, unless your doctor tells you not to. Discuss the foods you eat and the vitamins you take with your health care professional. See your dentist regularly. Brush and floss your teeth as directed. Before you have any dental work done, tell your dentist you are   receiving this medicine. Do not become pregnant while taking this medicine or for 5 months after stopping it. Talk with your doctor or health care professional about your birth control options while taking this medicine. Women should inform their doctor if they wish to become pregnant or think they might be pregnant. There is a potential for serious side  effects to an unborn child. Talk to your health care professional or pharmacist for more information. What side effects may I notice from receiving this medicine? Side effects that you should report to your doctor or health care professional as soon as possible:  allergic reactions like skin rash, itching or hives, swelling of the face, lips, or tongue  bone pain  breathing problems  dizziness  jaw pain, especially after dental work  redness, blistering, peeling of the skin  signs and symptoms of infection like fever or chills; cough; sore throat; pain or trouble passing urine  signs of low calcium like fast heartbeat, muscle cramps or muscle pain; pain, tingling, numbness in the hands or feet; seizures  unusual bleeding or bruising  unusually weak or tired Side effects that usually do not require medical attention (report to your doctor or health care professional if they continue or are bothersome):  constipation  diarrhea  headache  joint pain  loss of appetite  muscle pain  runny nose  tiredness  upset stomach This list may not describe all possible side effects. Call your doctor for medical advice about side effects. You may report side effects to FDA at 1-800-FDA-1088. Where should I keep my medicine? This medicine is only given in a clinic, doctor's office, or other health care setting and will not be stored at home. NOTE: This sheet is a summary. It may not cover all possible information. If you have questions about this medicine, talk to your doctor, pharmacist, or health care provider.  2020 Elsevier/Gold Standard (2017-12-10 16:10:44)

## 2019-03-16 ENCOUNTER — Encounter: Payer: Self-pay | Admitting: Internal Medicine

## 2019-03-16 ENCOUNTER — Other Ambulatory Visit: Payer: Self-pay | Admitting: Internal Medicine

## 2019-03-16 DIAGNOSIS — I10 Essential (primary) hypertension: Secondary | ICD-10-CM

## 2019-03-21 ENCOUNTER — Other Ambulatory Visit: Payer: Self-pay | Admitting: *Deleted

## 2019-03-21 NOTE — Telephone Encounter (Signed)
Last office visit was 12/21/2018 and last refill was 02/20/2019 (verified with pharmacist).

## 2019-03-22 ENCOUNTER — Ambulatory Visit (INDEPENDENT_AMBULATORY_CARE_PROVIDER_SITE_OTHER): Payer: PPO | Admitting: Internal Medicine

## 2019-03-22 ENCOUNTER — Other Ambulatory Visit: Payer: Self-pay

## 2019-03-22 DIAGNOSIS — G8220 Paraplegia, unspecified: Secondary | ICD-10-CM

## 2019-03-22 MED ORDER — OXYCODONE-ACETAMINOPHEN 10-325 MG PO TABS: 1.0000 | ORAL_TABLET | Freq: Three times a day (TID) | ORAL | 0 refills | Status: DC | PRN

## 2019-03-22 NOTE — Progress Notes (Signed)
Virtual Visit via Telephone Note  I connected with Stacy Diaz on 03/22/19 at  1:00 PM EDT by telephone and verified that I am speaking with the correct person using two identifiers.   I discussed the limitations, risks, security and privacy concerns of performing an evaluation and management service by telephone and the availability of in person appointments. I also discussed with the patient that there may be a patient responsible charge related to this service. The patient expressed understanding and agreed to proceed.  Location patient: home Location provider: work office Participants present for the call: patient, provider Patient did not have a visit in the prior 7 days to address this/these issue(s).   History of Present Illness:  She has scheduled this visit for medication refills per opioid contract. She has no new issues. She gets 90 tabs of oxycodone per month. She is wondering whether she can get 270 tabs all at once as it is difficult to get to [facility] (she is paraplegic and WC bound). No acute complaints today.   Observations/Objective: Patient sounds cheerful and well on the phone. I do not appreciate any increased work of breathing. Speech and thought processing are grossly intact. Patient reported vitals: BP 106/70   Current Outpatient Medications:  .  acetaminophen (TYLENOL) 500 MG tablet, Take 1 tablet (500 mg total) by mouth every 8 (eight) hours., Disp: 90 tablet, Rfl: 0 .  atenolol (TENORMIN) 50 MG tablet, 1-1/2 tablets twice daily, Disp: 300 tablet, Rfl: 1 .  atorvastatin (LIPITOR) 10 MG tablet, Take 1 tablet (10 mg total) by mouth daily at 6 PM., Disp: 90 tablet, Rfl: 4 .  baclofen (LIORESAL) 20 MG tablet, TAKE 1 TABLET BY MOUTH 4 TIMES A DAY, Disp: 360 tablet, Rfl: 4 .  clopidogrel (PLAVIX) 75 MG tablet, TAKE 1 TABLET BY MOUTH EVERY DAY, Disp: 90 tablet, Rfl: 1 .  diazepam (VALIUM) 5 MG tablet, Take 1 tablet (5 mg total) by mouth at bedtime., Disp: 90  tablet, Rfl: 1 .  letrozole (FEMARA) 2.5 MG tablet, Take 1 tablet (2.5 mg total) by mouth daily., Disp: 90 tablet, Rfl: 3 .  lisinopril (ZESTRIL) 10 MG tablet, TAKE 1 TABLET BY MOUTH DAILY AT BEDTIME, Disp: 90 tablet, Rfl: 1 .  Multiple Vitamin (MULTIVITAMIN WITH MINERALS) TABS tablet, Take 1 tablet by mouth every morning. , Disp: , Rfl:  .  OVER THE COUNTER MEDICATION, Apply 1 application topically daily as needed (pain). Aspercreme with 5% lidocaine, Disp: , Rfl:  .  Oxcarbazepine (TRILEPTAL) 300 MG tablet, Take 1 tablet (300 mg total) by mouth 2 (two) times daily., Disp: 180 tablet, Rfl: 1 .  oxyCODONE-acetaminophen (PERCOCET) 10-325 MG tablet, Take 1 tablet by mouth every 8 (eight) hours as needed for pain., Disp: 270 tablet, Rfl: 0 .  polyethylene glycol (MIRALAX / GLYCOLAX) packet, Take 17 g by mouth daily as needed (for constipation). Mix with liquid and drink, Disp: , Rfl:  .  senna (SENOKOT) 8.6 MG TABS tablet, Take 1 tablet by mouth., Disp: , Rfl:  .  tiZANidine (ZANAFLEX) 2 MG tablet, Take 1 tablet (2 mg total) by mouth 2 (two) times daily., Disp: 180 tablet, Rfl: 1 .  zinc oxide (BALMEX) 11.3 % CREA cream, Apply 1 application topically at bedtime as needed (IRRITATION). , Disp: , Rfl:   Review of Systems:  Constitutional: Denies fever, chills, diaphoresis, appetite change and fatigue.  HEENT: Denies photophobia, eye pain, redness, hearing loss, ear pain, congestion, sore throat, rhinorrhea, sneezing, mouth  sores, trouble swallowing, neck pain, neck stiffness and tinnitus.   Respiratory: Denies SOB, DOE, cough, chest tightness,  and wheezing.   Cardiovascular: Denies chest pain, palpitations and leg swelling.  Gastrointestinal: Denies nausea, vomiting, abdominal pain, diarrhea, constipation, blood in stool and abdominal distention.  Genitourinary: Denies dysuria, urgency, frequency, hematuria, flank pain and difficulty urinating.  Endocrine: Denies: hot or cold intolerance, sweats,  changes in hair or nails, polyuria, polydipsia. Musculoskeletal: Denies myalgias, back pain, joint swelling, arthralgias and gait problem.  Skin: Denies pallor, rash and wound.  Neurological: Denies dizziness, seizures, syncope, weakness, light-headedness, numbness and headaches.  Hematological: Denies adenopathy. Easy bruising, personal or family bleeding history  Psychiatric/Behavioral: Denies suicidal ideation, mood changes, confusion, nervousness, sleep disturbance and agitation   Assessment and Plan:  Paraplegia ([place])  -PDMP reviewed in Epic: ORS 270, no red flags. -Have called [facility] and they have agreed to give 270 tabs for 3 months. -UDS next office visit.  I discussed the assessment and treatment plan with the patient. The patient was provided an opportunity to ask questions and all were answered. The patient agreed with the plan and demonstrated an understanding of the instructions.   The patient was advised to call back or seek an in-person evaluation if the symptoms worsen or if the condition fails to improve as anticipated.  I provided 13 minutes of non-face-to-face time during this encounter.   Lelon Frohlich, MD [facility] at [facility]

## 2019-03-27 ENCOUNTER — Encounter: Payer: Self-pay | Admitting: Internal Medicine

## 2019-03-27 ENCOUNTER — Ambulatory Visit: Payer: PPO | Admitting: Hematology and Oncology

## 2019-03-27 DIAGNOSIS — G839 Paralytic syndrome, unspecified: Secondary | ICD-10-CM

## 2019-03-27 DIAGNOSIS — Z8673 Personal history of transient ischemic attack (TIA), and cerebral infarction without residual deficits: Secondary | ICD-10-CM

## 2019-03-27 DIAGNOSIS — G8220 Paraplegia, unspecified: Secondary | ICD-10-CM

## 2019-03-29 ENCOUNTER — Other Ambulatory Visit: Payer: Self-pay | Admitting: Hematology and Oncology

## 2019-03-29 DIAGNOSIS — C50512 Malignant neoplasm of lower-outer quadrant of left female breast: Secondary | ICD-10-CM

## 2019-03-29 DIAGNOSIS — Z170 Estrogen receptor positive status [ER+]: Secondary | ICD-10-CM

## 2019-04-18 ENCOUNTER — Telehealth: Payer: PPO | Admitting: Physician Assistant

## 2019-04-18 DIAGNOSIS — H1033 Unspecified acute conjunctivitis, bilateral: Secondary | ICD-10-CM

## 2019-04-18 MED ORDER — NAPHAZOLINE-PHENIRAMINE 0.025-0.3 % OP SOLN: 1.0000 [drp] | Freq: Four times a day (QID) | OPHTHALMIC | 0 refills | Status: DC | PRN

## 2019-04-18 NOTE — Progress Notes (Addendum)
We are sorry that you are not feeling well.  Here is how we plan to help!  Based on what you have shared with me it looks like you have conjunctivitis.  Conjunctivitis is a common inflammatory or infectious condition of the eye that is often referred to as "pink eye".  In most cases it is contagious (viral or bacterial). However, not all conjunctivitis requires antibiotics (ex. Allergic).  We have made appropriate suggestions for you based upon your presentation.  I recommend that you use Naphcon-A, 1-2 drops every 4-6 hours (an over the counter allergy drop available at your local pharmacy).  Your pharmacist may have an alternative suggestion.  You can also apply cool compresses to help will swelling of eyelids.   Pink eye can be highly contagious.  It is typically spread through direct contact with secretions, or contaminated objects or surfaces that one may have touched.  Strict handwashing is suggested with soap and water is urged.  If not available, use alcohol based had sanitizer.  Avoid unnecessary touching of the eye.  If you wear contact lenses, you will need to refrain from wearing them until you see no white discharge from the eye for at least 24 hours after being on medication.  You should see symptom improvement in 1-2 days after starting the medication regimen.  Call us or follow up with your PCP or an optometrist or ophthalmologist if symptoms are not improved in 1-2 days.  Home Care:  Wash your hands often!  Do not wear your contacts until you complete your treatment plan.  Avoid sharing towels, bed linen, personal items with a person who has pink eye.  See attention for anyone in your home with similar symptoms.  Get Help Right Away If:  Your symptoms do not improve.  You develop blurred or loss of vision.  You have pain with eye movements.   Your symptoms worsen (increased discharge, pain or redness)  Your e-visit answers were reviewed by a board certified advanced  clinical practitioner to complete your personal care plan.  Depending on the condition, your plan could have included both over the counter or prescription medications.  If there is a problem please reply  once you have received a response from your provider.  Your safety is important to [place].  If you have drug allergies check your prescription carefully.    You can use MyChart to ask questions about today's visit, request a non-urgent call back, or ask for a work or school excuse for 24 hours related to this e-Visit. If it has been greater than 24 hours you will need to follow up with your provider, or enter a new e-Visit to address those concerns.   You will get an e-mail in the next two days asking about your experience.  I hope that your e-visit has been valuable and will speed your recovery. Thank you for using e-visits.  Greater than 10 minutes, yet less than 20 minutes of time have been spent researching, coordinating, and implementing care for this patient today.

## 2019-04-20 ENCOUNTER — Encounter: Payer: Self-pay | Admitting: Internal Medicine

## 2019-04-21 ENCOUNTER — Other Ambulatory Visit: Payer: Self-pay

## 2019-04-21 ENCOUNTER — Ambulatory Visit (INDEPENDENT_AMBULATORY_CARE_PROVIDER_SITE_OTHER): Payer: PPO | Admitting: Internal Medicine

## 2019-04-21 ENCOUNTER — Encounter: Payer: Self-pay | Admitting: Internal Medicine

## 2019-04-21 VITALS — BP 110/70 | HR 58 | Temp 98.3°F

## 2019-04-21 DIAGNOSIS — H5789 Other specified disorders of eye and adnexa: Secondary | ICD-10-CM

## 2019-04-21 DIAGNOSIS — Z23 Encounter for immunization: Secondary | ICD-10-CM

## 2019-04-21 NOTE — Telephone Encounter (Signed)
Appointment scheduled for 04/21/2019

## 2019-04-21 NOTE — Progress Notes (Signed)
Acute Office Visit     CC/Reason for Visit: redness of right eye  HPI: Stacy Diaz is a 68 y.o. female who is coming in today for the above mentioned reasons. 1 week ago she started noticing pain of her right eye, it was itching and the top lid became swollen. She had a foreign body sensation. Her pharmacist gave her "red eye drops" which she has been using with significant relief. She has been applying warm compresses. She has had no discharge. No URI symptoms. She states it is much better today.  She is requesting flu vaccination today.   Past Medical/Surgical History: Past Medical History:  Diagnosis Date  . Anxiety   . Arthritis    "probably" (08/25/2012)  . Breast cancer of lower-outer quadrant of left female breast ([place]) 05/25/2014  . Headache(784.0)    "not real frequent" (08/25/2012)  . Heart disease   . HTN (hypertension)   . Hyperlipidemia   . Meningioma ([place]) 1999  . Myocardial infarction ([place]) 1987  . 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 S/P radiation therapy 07/30/14-08/30/13   left breast cancer/50Gy  . Seizures ([place])    no seizures since 2014  . Spastic paraparesis    S/P meningioma resection 1999/notes 08/25/2012  . Stroke ([place])    x 2, the last 2014    Past Surgical History:  Procedure Laterality Date  . BRAIN MENINGIOMA EXCISION  05/1997  . BREAST LUMPECTOMY WITH NEEDLE LOCALIZATION AND AXILLARY SENTINEL LYMPH NODE BX Left 06/22/2014   Procedure: LEFT WIRE LOCALIZED LUMPECTOPMY AND SENTINEL NODE MAPPING;  Surgeon: Autumn Messing III, MD;  Location: [facility];  Service: General;  Laterality: Left;  . CARDIAC CATHETERIZATION  1987  . CORONARY ANGIOPLASTY  1987   denied intervention: "blockage wasn't bad enough"  . TIBIA IM NAIL INSERTION Right 04/26/2018   Procedure: INTRAMEDULLARY (IM) NAIL TIBIAL;  Surgeon: Altamese Winthrop, MD;  Location: [facility];  Service: Orthopedics;  Laterality: Right;    Social History:  reports that she has  quit smoking. Her smoking use included cigarettes. She has a 20.00 pack-year smoking history. She has never used smokeless tobacco. She reports that she does not drink alcohol or use drugs.  Allergies: No Known Allergies  Family History:  Family History  Problem Relation Age of Onset  . Breast cancer Mother 65       unilateral  . Heart disease Father   . Breast cancer Paternal Grandmother 34       unilateral     Current Outpatient Medications:  .  acetaminophen (TYLENOL) 500 MG tablet, Take 1 tablet (500 mg total) by mouth every 8 (eight) hours., Disp: 90 tablet, Rfl: 0 .  atenolol (TENORMIN) 50 MG tablet, 1-1/2 tablets twice daily, Disp: 300 tablet, Rfl: 1 .  atorvastatin (LIPITOR) 10 MG tablet, Take 1 tablet (10 mg total) by mouth daily at 6 PM., Disp: 90 tablet, Rfl: 4 .  baclofen (LIORESAL) 20 MG tablet, TAKE 1 TABLET BY MOUTH 4 TIMES A DAY, Disp: 360 tablet, Rfl: 4 .  clopidogrel (PLAVIX) 75 MG tablet, TAKE 1 TABLET BY MOUTH EVERY DAY, Disp: 90 tablet, Rfl: 1 .  diazepam (VALIUM) 5 MG tablet, Take 1 tablet (5 mg total) by mouth at bedtime., Disp: 90 tablet, Rfl: 1 .  letrozole (FEMARA) 2.5 MG tablet, TAKE 1 TABLET BY MOUTH EVERY DAY, Disp: 90 tablet, Rfl: 3 .  lisinopril (ZESTRIL) 10 MG tablet, TAKE 1 TABLET BY  MOUTH DAILY AT BEDTIME, Disp: 90 tablet, Rfl: 1 .  Multiple Vitamin (MULTIVITAMIN WITH MINERALS) TABS tablet, Take 1 tablet by mouth every morning. , Disp: , Rfl:  .  naphazoline-pheniramine (NAPHCON-A) 0.025-0.3 % ophthalmic solution, Place 1 drop into both eyes 4 (four) times daily as needed for eye irritation., Disp: 15 mL, Rfl: 0 .  OVER THE COUNTER MEDICATION, Apply 1 application topically daily as needed (pain). Aspercreme with 5% lidocaine, Disp: , Rfl:  .  Oxcarbazepine (TRILEPTAL) 300 MG tablet, Take 1 tablet (300 mg total) by mouth 2 (two) times daily., Disp: 180 tablet, Rfl: 1 .  oxyCODONE-acetaminophen (PERCOCET) 10-325 MG tablet, Take 1 tablet by mouth every 8  (eight) hours as needed for pain., Disp: 270 tablet, Rfl: 0 .  polyethylene glycol (MIRALAX / GLYCOLAX) packet, Take 17 g by mouth daily as needed (for constipation). Mix with liquid and drink, Disp: , Rfl:  .  senna (SENOKOT) 8.6 MG TABS tablet, Take 1 tablet by mouth., Disp: , Rfl:  .  tiZANidine (ZANAFLEX) 2 MG tablet, Take 1 tablet (2 mg total) by mouth 2 (two) times daily., Disp: 180 tablet, Rfl: 1 .  zinc oxide (BALMEX) 11.3 % CREA cream, Apply 1 application topically at bedtime as needed (IRRITATION). , Disp: , Rfl:   Review of Systems:  Constitutional: Denies fever, chills, diaphoresis, appetite change and fatigue.  HEENT: Denies  hearing loss, ear pain, congestion, sore throat, rhinorrhea, sneezing, mouth sores, trouble swallowing, neck pain, neck stiffness and tinnitus.   Respiratory: Denies SOB, DOE, cough, chest tightness,  and wheezing.   Cardiovascular: Denies chest pain, palpitations and leg swelling.  Gastrointestinal: Denies nausea, vomiting, abdominal pain, diarrhea, constipation, blood in stool and abdominal distention.  Genitourinary: Denies dysuria, urgency, frequency, hematuria, flank pain and difficulty urinating.  Endocrine: Denies: hot or cold intolerance, sweats, changes in hair or nails, polyuria, polydipsia. Musculoskeletal: Denies myalgias, back pain, joint swelling, arthralgias and gait problem.  Skin: Denies pallor, rash and wound.  Neurological: Denies dizziness, seizures, syncope, weakness, light-headedness, numbness and headaches.  Hematological: Denies adenopathy. Easy bruising, personal or family bleeding history  Psychiatric/Behavioral: Denies suicidal ideation, mood changes, confusion, nervousness, sleep disturbance and agitation    Physical Exam: Vitals:   04/21/19 1308  BP: 110/70  Pulse: (!) 58  Temp: 98.3 F (36.8 C)  TempSrc: Temporal  SpO2: 98%    There is no height or weight on file to calculate BMI.   Constitutional: NAD, calm,  comfortable, wheelchair-bound. Eyes: PERRL, conjunctival injection of her right eye, slight edema to her top eyelid, no foreign body identified. No discharge apparent. Wears corrective lenses. ENMT: Mucous membranes are moist. Posterior pharynx clear of any exudate or lesions. Normal dentition. Tympanic membrane is pearly white, no erythema or bulging. Psychiatric: Normal judgment and insight. Alert and oriented x 3. Normal mood.    Impression and Plan:  Redness of right eye -Unclear etiology: foreign body vs viral vs allergic conjunctivitis. -In any case, it is already improving. -She no need for abx drops or ointment. -Have advised natural tears in gel formation. -RTC in 7-10 days if no improvement. Can consider ophtho referral then.    Patient Instructions  -Nice seeing you today!!  -Come back to see [place] in 7-10 days if no better.       Lelon Frohlich, MD [facility] at [facility]

## 2019-04-21 NOTE — Addendum Note (Signed)
Addended by: Westley Hummer B on: 04/21/2019 02:55 PM   Modules accepted: Orders

## 2019-04-21 NOTE — Patient Instructions (Signed)
-  Nice seeing you today!!  -Come back to see [place] in 7-10 days if no better.

## 2019-04-28 ENCOUNTER — Encounter: Payer: Self-pay | Admitting: Internal Medicine

## 2019-05-05 NOTE — Telephone Encounter (Signed)
Placed in Dr Hernandez's office  

## 2019-05-05 NOTE — Telephone Encounter (Signed)
Copied from [place] [phone]. Topic: General - Other >> May 04, 2019  3:50 PM Ivar Drape wrote: Reason for CRM:  Tashala w/[facility] [employer] [phone] faxed an order for a catherer on 05/01/2019 and she is checking on the status of that order.

## 2019-05-16 NOTE — Telephone Encounter (Unsigned)
Copied from [place] [id number]. Topic: General - Inquiry >> May 16, 2019  9:47 AM Berneta Levins wrote: Reason for CRM:   Coralyn Mark with [facility] calling.  States they did receive the request for the female cathedors.  States that chart notes did not have necessary information for insurance and she sent over a request for chart note with updates reflecting medical necessity - she is calling to check to make sure that this was received as they have not received it back yet. Coralyn Mark can be reached at [phone].

## 2019-05-18 ENCOUNTER — Encounter: Payer: Self-pay | Admitting: Internal Medicine

## 2019-05-21 ENCOUNTER — Encounter: Payer: Self-pay | Admitting: Internal Medicine

## 2019-05-21 DIAGNOSIS — I10 Essential (primary) hypertension: Secondary | ICD-10-CM

## 2019-05-23 ENCOUNTER — Telehealth: Payer: Self-pay | Admitting: Internal Medicine

## 2019-05-23 NOTE — Telephone Encounter (Signed)
Patient's husband called back and restated the dosages in this message.

## 2019-05-24 NOTE — Telephone Encounter (Signed)
disregard

## 2019-05-24 NOTE — Telephone Encounter (Signed)
Copied from [place] [id number]. Topic: General - Other >> May 23, 2019  9:34 AM Yvette Rack wrote: Reason for CRM: Nayara with [facility] requested to speak with Joycelyn Schmid. Attempted to contact the office but the line remained busy. Cb# [phone]

## 2019-05-25 ENCOUNTER — Telehealth: Payer: Self-pay | Admitting: Internal Medicine

## 2019-05-25 ENCOUNTER — Other Ambulatory Visit: Payer: Self-pay | Admitting: Internal Medicine

## 2019-05-25 MED ORDER — LISINOPRIL 10 MG PO TABS: ORAL_TABLET | ORAL | 0 refills | Status: DC

## 2019-05-25 MED ORDER — LISINOPRIL 20 MG PO TABS: 20.0000 mg | ORAL_TABLET | Freq: Two times a day (BID) | ORAL | 0 refills | Status: DC

## 2019-05-25 NOTE — Telephone Encounter (Signed)
I called the pt and spoke with her husband.  Mr Massaro stated this was advised by Dr Sherren Mocha, was noted on the pts Mychart; however for some reason it was removed.  He is aware the Rxs were sent to the pharmacy and an appt was scheduled for 11/19.

## 2019-05-25 NOTE — Telephone Encounter (Signed)
Not sure this message is intended for Margarett.Marland KitchenMarland KitchenMarland Kitchen

## 2019-05-25 NOTE — Telephone Encounter (Signed)
Copied from [place] [id number]. Topic: General - Other >> May 23, 2019  9:34 AM Yvette Rack wrote: Reason for CRM: Aariel with [facility] requested to speak with Joycelyn Schmid. Attempted to contact the office but the line remained busy. Cb# [phone]  Jackolyn calling back again to follow up on request for an addendum to an office note.  She faxed on 05/23/19.  [phone]  She will refax now.

## 2019-05-26 NOTE — Telephone Encounter (Signed)
ATC, unable to leave a voicemail. CRM created.

## 2019-05-31 NOTE — Telephone Encounter (Signed)
Spoke with husband and nothing further is needed at this time.

## 2019-06-19 ENCOUNTER — Encounter: Payer: Self-pay | Admitting: Internal Medicine

## 2019-06-20 ENCOUNTER — Telehealth: Payer: Self-pay | Admitting: Internal Medicine

## 2019-06-20 ENCOUNTER — Other Ambulatory Visit: Payer: Self-pay

## 2019-06-20 ENCOUNTER — Telehealth (INDEPENDENT_AMBULATORY_CARE_PROVIDER_SITE_OTHER): Payer: PPO | Admitting: Internal Medicine

## 2019-06-20 DIAGNOSIS — G8220 Paraplegia, unspecified: Secondary | ICD-10-CM | POA: Diagnosis not present

## 2019-06-20 MED ORDER — OXYCODONE-ACETAMINOPHEN 10-325 MG PO TABS: 1.0000 | ORAL_TABLET | Freq: Three times a day (TID) | ORAL | 0 refills | Status: DC | PRN

## 2019-06-20 NOTE — Progress Notes (Signed)
Virtual Visit via Video Note  I connected with Stacy Diaz on 06/20/19 at  3:00 PM EST by a video enabled telemedicine application and verified that I am speaking with the correct person using two identifiers.  Location patient: home Location provider: work office Persons participating in the virtual visit: patient, provider  I discussed the limitations of evaluation and management by telemedicine and the availability of in person appointments. The patient expressed understanding and agreed to proceed.   HPI: This is a scheduled visit for medication refills per opioid contract. She is doing well and has no new issues. She is now getting #270 tabs for the whole 3 months instead of #90 per month as it is difficult for her to get to the pharmacy given her paraplegia.   ROS: Constitutional: Denies fever, chills, diaphoresis, appetite change and fatigue.  HEENT: Denies photophobia, eye pain, redness, hearing loss, ear pain, congestion, sore throat, rhinorrhea, sneezing, mouth sores, trouble swallowing, neck pain, neck stiffness and tinnitus.   Respiratory: Denies SOB, DOE, cough, chest tightness,  and wheezing.   Cardiovascular: Denies chest pain, palpitations and leg swelling.  Gastrointestinal: Denies nausea, vomiting, abdominal pain, diarrhea, constipation, blood in stool and abdominal distention.  Genitourinary: Denies dysuria, urgency, frequency, hematuria, flank pain and difficulty urinating.  Endocrine: Denies: hot or cold intolerance, sweats, changes in hair or nails, polyuria, polydipsia. Musculoskeletal: Denies myalgias, back pain, joint swelling, arthralgias and gait problem.  Skin: Denies pallor, rash and wound.  Neurological: Denies dizziness, seizures, syncope, weakness, light-headedness, numbness and headaches.  Hematological: Denies adenopathy. Easy bruising, personal or family bleeding history  Psychiatric/Behavioral: Denies suicidal ideation, mood changes,  confusion, nervousness, sleep disturbance and agitation   Past Medical History:  Diagnosis Date  . Anxiety   . Arthritis    "probably" (08/25/2012)  . Breast cancer of lower-outer quadrant of left female breast ([place]) 05/25/2014  . Headache(784.0)    "not real frequent" (08/25/2012)  . Heart disease   . HTN (hypertension)   . Hyperlipidemia   . Meningioma ([place]) 1999  . Myocardial infarction ([place]) 1987  . 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 S/P radiation therapy 07/30/14-08/30/13   left breast cancer/50Gy  . Seizures ([place])    no seizures since 2014  . Spastic paraparesis    S/P meningioma resection 1999/notes 08/25/2012  . Stroke ([place])    x 2, the last 2014    Past Surgical History:  Procedure Laterality Date  . BRAIN MENINGIOMA EXCISION  05/1997  . BREAST LUMPECTOMY WITH NEEDLE LOCALIZATION AND AXILLARY SENTINEL LYMPH NODE BX Left 06/22/2014   Procedure: LEFT WIRE LOCALIZED LUMPECTOPMY AND SENTINEL NODE MAPPING;  Surgeon: Autumn Messing III, MD;  Location: [facility];  Service: General;  Laterality: Left;  . CARDIAC CATHETERIZATION  1987  . CORONARY ANGIOPLASTY  1987   denied intervention: "blockage wasn't bad enough"  . TIBIA IM NAIL INSERTION Right 04/26/2018   Procedure: INTRAMEDULLARY (IM) NAIL TIBIAL;  Surgeon: Altamese , MD;  Location: [facility];  Service: Orthopedics;  Laterality: Right;    Family History  Problem Relation Age of Onset  . Breast cancer Mother 60       unilateral  . Heart disease Father   . Breast cancer Paternal Grandmother 33       unilateral    SOCIAL HX:   reports that she has quit smoking. Her smoking use included cigarettes. She has a 20.00 pack-year smoking history. She has never used smokeless  tobacco. She reports that she does not drink alcohol or use drugs.   Current Outpatient Medications:  .  acetaminophen (TYLENOL) 500 MG tablet, Take 1 tablet (500 mg total) by mouth every 8 (eight) hours., Disp: 90 tablet, Rfl: 0 .   atenolol (TENORMIN) 50 MG tablet, 1-1/2 tablets twice daily, Disp: 300 tablet, Rfl: 1 .  atorvastatin (LIPITOR) 10 MG tablet, Take 1 tablet (10 mg total) by mouth daily at 6 PM., Disp: 90 tablet, Rfl: 4 .  baclofen (LIORESAL) 20 MG tablet, TAKE 1 TABLET BY MOUTH 4 TIMES A DAY, Disp: 360 tablet, Rfl: 4 .  clopidogrel (PLAVIX) 75 MG tablet, TAKE 1 TABLET BY MOUTH EVERY DAY, Disp: 90 tablet, Rfl: 1 .  diazepam (VALIUM) 5 MG tablet, Take 1 tablet (5 mg total) by mouth at bedtime., Disp: 90 tablet, Rfl: 1 .  letrozole (FEMARA) 2.5 MG tablet, TAKE 1 TABLET BY MOUTH EVERY DAY, Disp: 90 tablet, Rfl: 3 .  lisinopril (ZESTRIL) 10 MG tablet, TAKE 1 TABLET BY MOUTH DAILY AT BEDTIME, Disp: 90 tablet, Rfl: 0 .  lisinopril (ZESTRIL) 20 MG tablet, Take 1 tablet (20 mg total) by mouth 2 (two) times daily., Disp: 180 tablet, Rfl: 0 .  Multiple Vitamin (MULTIVITAMIN WITH MINERALS) TABS tablet, Take 1 tablet by mouth every morning. , Disp: , Rfl:  .  naphazoline-pheniramine (NAPHCON-A) 0.025-0.3 % ophthalmic solution, Place 1 drop into both eyes 4 (four) times daily as needed for eye irritation., Disp: 15 mL, Rfl: 0 .  OVER THE COUNTER MEDICATION, Apply 1 application topically daily as needed (pain). Aspercreme with 5% lidocaine, Disp: , Rfl:  .  Oxcarbazepine (TRILEPTAL) 300 MG tablet, Take 1 tablet (300 mg total) by mouth 2 (two) times daily., Disp: 180 tablet, Rfl: 1 .  oxyCODONE-acetaminophen (PERCOCET) 10-325 MG tablet, Take 1 tablet by mouth every 8 (eight) hours as needed for pain., Disp: 270 tablet, Rfl: 0 .  polyethylene glycol (MIRALAX / GLYCOLAX) packet, Take 17 g by mouth daily as needed (for constipation). Mix with liquid and drink, Disp: , Rfl:  .  senna (SENOKOT) 8.6 MG TABS tablet, Take 1 tablet by mouth., Disp: , Rfl:  .  tiZANidine (ZANAFLEX) 2 MG tablet, Take 1 tablet (2 mg total) by mouth 2 (two) times daily., Disp: 180 tablet, Rfl: 1 .  zinc oxide (BALMEX) 11.3 % CREA cream, Apply 1 application  topically at bedtime as needed (IRRITATION). , Disp: , Rfl:   EXAM:   VITALS per patient if applicable: none reported  GENERAL: alert, oriented, appears well and in no acute distress, in wheelchair  HEENT: atraumatic, conjunttiva clear, no obvious abnormalities on inspection of external nose and ears, wears corrective lenses  NECK: normal movements of the head and neck  LUNGS: on inspection no signs of respiratory distress, breathing rate appears normal, no obvious gross increased work of breathing, gasping or wheezing  CV: no obvious cyanosis  MS: moves all visible extremities without noticeable abnormality  PSYCH/NEURO: pleasant and cooperative, no obvious depression or anxiety, speech and thought processing grossly intact  ASSESSMENT AND PLAN:   Paraplegia ([place]) -NCCSRS reviewed in EPIC   Indication for chronic opioid: H/o CVA, chronic pain syndrome, paraplegia Medication and dose: Oxycodone 10/325 mg q 8 hrs PRN pain # pills per month: #90 (#270 x 3 months) Last UDS date: none on file Opioid Treatment Agreement signed: yes NCCSRS reviewed this encounter (include red flags):  yes, no red flags, I am only prescriber, ORS 250.  I discussed the assessment and treatment plan with the patient. The patient was provided an opportunity to ask questions and all were answered. The patient agreed with the plan and demonstrated an understanding of the instructions.   The patient was advised to call back or seek an in-person evaluation if the symptoms worsen or if the condition fails to improve as anticipated.    Lelon Frohlich, MD  [facility] at [facility]

## 2019-06-20 NOTE — Telephone Encounter (Signed)
Disregard

## 2019-07-06 ENCOUNTER — Ambulatory Visit: Payer: PPO | Admitting: Internal Medicine

## 2019-07-18 ENCOUNTER — Other Ambulatory Visit: Payer: Self-pay | Admitting: Internal Medicine

## 2019-07-18 DIAGNOSIS — G8220 Paraplegia, unspecified: Secondary | ICD-10-CM

## 2019-07-19 NOTE — Telephone Encounter (Signed)
Last OV 06/20/19 Last refill 01/13/19 # 180/1 Next OV not scheduled

## 2019-08-02 ENCOUNTER — Telehealth: Payer: Self-pay | Admitting: Hematology and Oncology

## 2019-08-02 NOTE — Telephone Encounter (Signed)
[facility] 1/27 appointments moved from PM to AM. Left message. Schedule mailed.

## 2019-08-03 ENCOUNTER — Other Ambulatory Visit: Payer: Self-pay | Admitting: Internal Medicine

## 2019-08-04 ENCOUNTER — Other Ambulatory Visit: Payer: Self-pay | Admitting: Internal Medicine

## 2019-08-09 ENCOUNTER — Other Ambulatory Visit: Payer: Self-pay | Admitting: *Deleted

## 2019-08-09 MED ORDER — ATORVASTATIN CALCIUM 10 MG PO TABS: 10.0000 mg | ORAL_TABLET | Freq: Every day | ORAL | 1 refills | Status: DC

## 2019-08-09 MED ORDER — BACLOFEN 20 MG PO TABS: ORAL_TABLET | ORAL | 1 refills | Status: DC

## 2019-08-17 ENCOUNTER — Encounter: Payer: Self-pay | Admitting: Internal Medicine

## 2019-08-17 DIAGNOSIS — G40909 Epilepsy, unspecified, not intractable, without status epilepticus: Secondary | ICD-10-CM | POA: Diagnosis not present

## 2019-08-17 DIAGNOSIS — D496 Neoplasm of unspecified behavior of brain: Secondary | ICD-10-CM | POA: Diagnosis not present

## 2019-08-17 DIAGNOSIS — M810 Age-related osteoporosis without current pathological fracture: Secondary | ICD-10-CM | POA: Diagnosis not present

## 2019-08-17 DIAGNOSIS — Z853 Personal history of malignant neoplasm of breast: Secondary | ICD-10-CM | POA: Diagnosis not present

## 2019-08-17 DIAGNOSIS — Z1231 Encounter for screening mammogram for malignant neoplasm of breast: Secondary | ICD-10-CM | POA: Diagnosis not present

## 2019-08-17 DIAGNOSIS — G8220 Paraplegia, unspecified: Secondary | ICD-10-CM | POA: Diagnosis not present

## 2019-08-17 LAB — HM DEXA SCAN

## 2019-08-21 ENCOUNTER — Other Ambulatory Visit: Payer: Self-pay | Admitting: Internal Medicine

## 2019-08-23 ENCOUNTER — Other Ambulatory Visit: Payer: Self-pay | Admitting: Internal Medicine

## 2019-08-23 DIAGNOSIS — G8220 Paraplegia, unspecified: Secondary | ICD-10-CM

## 2019-08-24 ENCOUNTER — Encounter: Payer: Self-pay | Admitting: Internal Medicine

## 2019-08-26 ENCOUNTER — Other Ambulatory Visit: Payer: Self-pay | Admitting: Internal Medicine

## 2019-08-26 DIAGNOSIS — R569 Unspecified convulsions: Secondary | ICD-10-CM

## 2019-09-12 ENCOUNTER — Encounter: Payer: Self-pay | Admitting: Internal Medicine

## 2019-09-12 NOTE — Progress Notes (Signed)
Patient Care Team: Isaac Bliss, Rayford Halsted, MD as PCP - General (Internal Medicine) Jovita Kussmaul, MD as Consulting Physician (General Surgery) Nicholas Lose, MD as Consulting Physician (Hematology and Oncology) Kyung Rudd, MD as Consulting Physician (Radiation Oncology) Daneen Schick as [facility]  DIAGNOSIS:    ICD-10-CM   1. Malignant neoplasm of lower-outer quadrant of left breast of female, estrogen receptor positive ([place])  C50.512    Z17.0     SUMMARY OF ONCOLOGIC HISTORY: Oncology History  Breast cancer of lower-outer quadrant of left female breast ([place])  05/21/2014 Mammogram   Mammogram and Ultrasound: Left breast 2 cm complex cystic asymmetric abnormality   05/25/2014 Initial Diagnosis   Invasive mammary cancer with lobular features with perineural invasion, grade 2, ER positive PR positive HER-2 negative, Ki-67 25%   06/22/2014 Surgery   Breast lumpectomy: Invasive lobular carcinoma, grade 3, 2.7 cm, with LCIS, ER positive, PR positive, HER-2 negative, Ki-67 65%, T2, N0, M0 stage II A3 SLN negative; oncotype 16 low risk 10% ROR   07/30/2014 - 08/28/2014 Radiation Therapy   Adjuvant radiation   09/07/2014 -  Anti-estrogen oral therapy   Anastrazole 1 mg daily switch to letrozole 06/25/2015 for myalgias   06/02/2016 - 06/05/2016 Hospital Admission   TIA/CVA/seizures     CHIEF COMPLIANT: Surveillance of breast cancer on letrozole therapy  INTERVAL HISTORY: Stacy Diaz is a 69 y.o. with above-mentioned history of invasive lobular cancer who underwent a lumpectomy, radiation, and is currently on oral antiestrogen therapy with letrozole. Mammogram on 08/17/19 showed no evidence of malignancy bilaterally. Bone density scan on 08/17/19 showed osteoporosis with a T-score of -4.0 at multiple sites. She presents to the clinic today for annual follow-up.   ALLERGIES:  has No Known Allergies.  MEDICATIONS:  Current Outpatient Medications   Medication Sig Dispense Refill  . acetaminophen (TYLENOL) 500 MG tablet Take 1 tablet (500 mg total) by mouth every 8 (eight) hours. 90 tablet 0  . atenolol (TENORMIN) 50 MG tablet TAKE 1 & 1/2 TABLET BY MOUTH TWICE DAILY 270 tablet 2  . atorvastatin (LIPITOR) 10 MG tablet Take 1 tablet (10 mg total) by mouth daily at 6 PM. 90 tablet 1  . baclofen (LIORESAL) 20 MG tablet TAKE 1 TABLET BY MOUTH 4 TIMES A DAY 360 tablet 1  . clopidogrel (PLAVIX) 75 MG tablet TAKE 1 TABLET BY MOUTH EVERY DAY 90 tablet 1  . diazepam (VALIUM) 5 MG tablet TAKE 1 TABLET BY MOUTH EVERYDAY AT BEDTIME 90 tablet 1  . letrozole (FEMARA) 2.5 MG tablet TAKE 1 TABLET BY MOUTH EVERY DAY 90 tablet 3  . lisinopril (ZESTRIL) 10 MG tablet TAKE 1 TABLET BY MOUTH DAILY AT BEDTIME 90 tablet 0  . lisinopril (ZESTRIL) 20 MG tablet TAKE 1 TABLET BY MOUTH TWICE A DAY 180 tablet 1  . Multiple Vitamin (MULTIVITAMIN WITH MINERALS) TABS tablet Take 1 tablet by mouth every morning.     . naphazoline-pheniramine (NAPHCON-A) 0.025-0.3 % ophthalmic solution Place 1 drop into both eyes 4 (four) times daily as needed for eye irritation. 15 mL 0  . OVER THE COUNTER MEDICATION Apply 1 application topically daily as needed (pain). Aspercreme with 5% lidocaine    . Oxcarbazepine (TRILEPTAL) 300 MG tablet TAKE 1 TABLET (300 MG TOTAL) BY MOUTH 2 (TWO) TIMES DAILY. 180 tablet 1  . oxyCODONE-acetaminophen (PERCOCET) 10-325 MG tablet Take 1 tablet by mouth every 8 (eight) hours as needed for pain. 270 tablet 0  .  polyethylene glycol (MIRALAX / GLYCOLAX) packet Take 17 g by mouth daily as needed (for constipation). Mix with liquid and drink    . senna (SENOKOT) 8.6 MG TABS tablet Take 1 tablet by mouth.    Marland Kitchen tiZANidine (ZANAFLEX) 2 MG tablet TAKE 1 TABLET BY MOUTH 2 TIMES DAILY 180 tablet 1  . zinc oxide (BALMEX) 11.3 % CREA cream Apply 1 application topically at bedtime as needed (IRRITATION).      No current facility-administered medications for this  visit.    PHYSICAL EXAMINATION: ECOG PERFORMANCE STATUS: 1 - Symptomatic but completely ambulatory  Vitals:   09/13/19 1047  BP: (!) 163/84  Pulse: 84  Resp: 17  Temp: 98 F (36.7 C)  SpO2: 100%   Filed Weights    BREAST: No palpable masses or nodules in either right or left breasts. No palpable axillary supraclavicular or infraclavicular adenopathy no breast tenderness or nipple discharge. (exam performed in the presence of a chaperone)  LABORATORY DATA:  I have reviewed the data as listed CMP Latest Ref Rng & Units 03/13/2019 09/12/2018 04/28/2018  Glucose 70 - 99 mg/dL 104(H) 96 120(H)  BUN 8 - 23 mg/dL 12 12 6(L)  Creatinine 0.44 - 1.00 mg/dL 0.68 0.68 0.53  Sodium 135 - 145 mmol/L 130(L) 133(L) 134(L)  Potassium 3.5 - 5.1 mmol/L 4.2 4.4 4.0  Chloride 98 - 111 mmol/L 98 99 103  CO2 22 - 32 mmol/L '23 25 23  ' Calcium 8.9 - 10.3 mg/dL 9.3 9.8 8.9  Total Protein 6.5 - 8.1 g/dL 7.5 7.8 6.5  Total Bilirubin 0.3 - 1.2 mg/dL 0.4 0.5 1.0  Alkaline Phos 38 - 126 U/L 52 102 65  AST 15 - 41 U/L 19 22 44(H)  ALT 0 - 44 U/L 19 26 38    Lab Results  Component Value Date   WBC 10.4 09/12/2018   HGB 15.1 (H) 09/12/2018   HCT 45.4 09/12/2018   MCV 89.0 09/12/2018   PLT 221 09/12/2018   NEUTROABS 7.2 09/12/2018    ASSESSMENT & PLAN:  Breast cancer of lower-outer quadrant of left female breast ([place]) 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 Oncotype DX recurrence score 16; 10% risk of recurrence, low risk did not need chemotherapy status post radiation therapy completed January 2016, started antiestrogen therapy with Arimidex 1 mg daily 09/07/2014 Switched to letrozole November 2016 discontinued 09/13/2019 because of worsening osteoporosis.Marland Kitchen  letrozole toxicities: Patient has chronic muscle stiffness and aches. These symptoms are much better on letrozole then on anastrozole. We discontinued  letrozole therapy because of worsening osteoporosis.  Seizures/CVA: Hospitalization October 2017 Myalgias and arthralgias: Continues to be a problem Fracture of the left leg with surgery and rod implantation: Chronic pain I discussed with her that her biggest risk is when falling.  Osteoporosis: T score -4: on Prolia every 6 months along with calcium and vitamin D.  She is very anxious and scared about breaking bones because of worsening osteoporosis.  Breast Cancer Surveillance: 1. Breast exam: 09/13/2019 scar tissue was palpated and no concerns were identified  2. Mammogram12/31/2020No abnormalities. Postsurgical changes. Breast density category C.  3.  Bone density 08/17/2019: T score -4: Severe osteoporosis  Return to clinic in 1 year for follow-up and every 6 months for Prolia injections with labs.    No orders of the defined types were placed in this encounter.  The patient has a good understanding  of the overall plan. she agrees with it. she will call with any problems that may develop before the next visit here.  Total time spent: 20 mins including face to face time and time spent for planning, charting and coordination of care  Nicholas Lose, MD 09/13/2019  I, Cloyde Reams Dorshimer, am acting as scribe for Dr. Nicholas Lose.  I have reviewed the above documentation for accuracy and completeness, and I agree with the above.

## 2019-09-13 ENCOUNTER — Ambulatory Visit: Payer: Self-pay

## 2019-09-13 ENCOUNTER — Ambulatory Visit: Payer: PPO | Admitting: Hematology and Oncology

## 2019-09-13 ENCOUNTER — Other Ambulatory Visit: Payer: Self-pay

## 2019-09-13 ENCOUNTER — Inpatient Hospital Stay (HOSPITAL_BASED_OUTPATIENT_CLINIC_OR_DEPARTMENT_OTHER): Payer: PPO | Admitting: Hematology and Oncology

## 2019-09-13 ENCOUNTER — Inpatient Hospital Stay: Payer: PPO

## 2019-09-13 ENCOUNTER — Ambulatory Visit: Payer: PPO

## 2019-09-13 ENCOUNTER — Other Ambulatory Visit: Payer: PPO

## 2019-09-13 ENCOUNTER — Inpatient Hospital Stay: Payer: PPO | Attending: Hematology and Oncology

## 2019-09-13 VITALS — BP 152/72

## 2019-09-13 DIAGNOSIS — M810 Age-related osteoporosis without current pathological fracture: Secondary | ICD-10-CM | POA: Insufficient documentation

## 2019-09-13 DIAGNOSIS — M8000XA Age-related osteoporosis with current pathological fracture, unspecified site, initial encounter for fracture: Secondary | ICD-10-CM

## 2019-09-13 DIAGNOSIS — C50512 Malignant neoplasm of lower-outer quadrant of left female breast: Secondary | ICD-10-CM | POA: Diagnosis not present

## 2019-09-13 DIAGNOSIS — Z170 Estrogen receptor positive status [ER+]: Secondary | ICD-10-CM

## 2019-09-13 LAB — CMP (CANCER CENTER ONLY)
ALT: 18 U/L (ref 0–44)
AST: 19 U/L (ref 15–41)
Albumin: 4.3 g/dL (ref 3.5–5.0)
Alkaline Phosphatase: 52 U/L (ref 38–126)
Anion gap: 9 (ref 5–15)
BUN: 13 mg/dL (ref 8–23)
CO2: 27 mmol/L (ref 22–32)
Calcium: 9.2 mg/dL (ref 8.9–10.3)
Chloride: 100 mmol/L (ref 98–111)
Creatinine: 0.69 mg/dL (ref 0.44–1.00)
GFR, Est AFR Am: >60 mL/min (ref >60)
GFR, Estimated: >60 mL/min (ref >60)
Glucose, Bld: 82 mg/dL (ref 70–99)
Potassium: 4.5 mmol/L (ref 3.5–5.1)
Sodium: 136 mmol/L (ref 135–145)
Total Bilirubin: 0.3 mg/dL (ref 0.3–1.2)
Total Protein: 7.1 g/dL (ref 6.5–8.1)

## 2019-09-13 MED ORDER — DENOSUMAB 60 MG/ML ~~LOC~~ SOSY
60.0000 mg | PREFILLED_SYRINGE | Freq: Once | SUBCUTANEOUS | Status: AC
  Administered 2019-09-13: 60 mg via SUBCUTANEOUS

## 2019-09-13 MED ORDER — DENOSUMAB 60 MG/ML ~~LOC~~ SOSY
PREFILLED_SYRINGE | SUBCUTANEOUS | Status: AC
  Filled 2019-09-13: qty 1

## 2019-09-13 NOTE — Assessment & Plan Note (Signed)
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 Oncotype DX recurrence score 16; 10% risk of recurrence, low risk did not need chemotherapy status post radiation therapy completed January 2016, started antiestrogen therapy with Arimidex 1 mg daily 09/07/2014 Switched to letrozole November 2016.  letrozole toxicities: Patient has chronic muscle stiffness and aches. These symptoms are much better on letrozole then on anastrozole.  Seizures/CVA: Hospitalization October 2017 Myalgias and arthralgias: Continues to be a problem Fracture of the left leg with surgery and rod implantation: Causing significant pain and discomfort September 2019.  Osteoporosis: T score -4: on Prolia every 6 months along with calcium and vitamin D.  Breast Cancer Surveillance: 1. Breast exam: 09/13/2019 scar tissue was palpated and no concerns were identified  2. Mammogram12/31/2020No abnormalities. Postsurgical changes. Breast density category C.  3.  Bone density 08/17/2019: T score -4: Severe osteoporosis  Return to clinic in 1 year for follow-up and every 6 months for Prolia injections with labs. 

## 2019-09-13 NOTE — Patient Instructions (Signed)
Denosumab injection What is this medicine? DENOSUMAB (den oh sue mab) slows bone breakdown. Prolia is used to treat osteoporosis in women after menopause and in men, and in people who are taking corticosteroids for 6 months or more. Xgeva is used to treat a high calcium level due to cancer and to prevent bone fractures and other bone problems caused by multiple myeloma or cancer bone metastases. Xgeva is also used to treat giant cell tumor of the bone. This medicine may be used for other purposes; ask your health care provider or pharmacist if you have questions. COMMON BRAND NAME(S): Prolia, XGEVA What should I tell my health care provider before I take this medicine? They need to know if you have any of these conditions:  dental disease  having surgery or tooth extraction  infection  kidney disease  low levels of calcium or Vitamin D in the blood  malnutrition  on hemodialysis  skin conditions or sensitivity  thyroid or parathyroid disease  an unusual reaction to denosumab, other medicines, foods, dyes, or preservatives  pregnant or trying to get pregnant  breast-feeding How should I use this medicine? This medicine is for injection under the skin. It is given by a health care professional in a hospital or clinic setting. A special MedGuide will be given to you before each treatment. Be sure to read this information carefully each time. For Prolia, talk to your pediatrician regarding the use of this medicine in children. Special care may be needed. For Xgeva, talk to your pediatrician regarding the use of this medicine in children. While this drug may be prescribed for children as young as 13 years for selected conditions, precautions do apply. Overdosage: If you think you have taken too much of this medicine contact a poison control center or emergency room at once. NOTE: This medicine is only for you. Do not share this medicine with others. What if I miss a dose? It is  important not to miss your dose. Call your doctor or health care professional if you are unable to keep an appointment. What may interact with this medicine? Do not take this medicine with any of the following medications:  other medicines containing denosumab This medicine may also interact with the following medications:  medicines that lower your chance of fighting infection  steroid medicines like prednisone or cortisone This list may not describe all possible interactions. Give your health care provider a list of all the medicines, herbs, non-prescription drugs, or dietary supplements you use. Also tell them if you smoke, drink alcohol, or use illegal drugs. Some items may interact with your medicine. What should I watch for while using this medicine? Visit your doctor or health care professional for regular checks on your progress. Your doctor or health care professional may order blood tests and other tests to see how you are doing. Call your doctor or health care professional for advice if you get a fever, chills or sore throat, or other symptoms of a cold or flu. Do not treat yourself. This drug may decrease your body's ability to fight infection. Try to avoid being around people who are sick. You should make sure you get enough calcium and vitamin D while you are taking this medicine, unless your doctor tells you not to. Discuss the foods you eat and the vitamins you take with your health care professional. See your dentist regularly. Brush and floss your teeth as directed. Before you have any dental work done, tell your dentist you are   receiving this medicine. Do not become pregnant while taking this medicine or for 5 months after stopping it. Talk with your doctor or health care professional about your birth control options while taking this medicine. Women should inform their doctor if they wish to become pregnant or think they might be pregnant. There is a potential for serious side  effects to an unborn child. Talk to your health care professional or pharmacist for more information. What side effects may I notice from receiving this medicine? Side effects that you should report to your doctor or health care professional as soon as possible:  allergic reactions like skin rash, itching or hives, swelling of the face, lips, or tongue  bone pain  breathing problems  dizziness  jaw pain, especially after dental work  redness, blistering, peeling of the skin  signs and symptoms of infection like fever or chills; cough; sore throat; pain or trouble passing urine  signs of low calcium like fast heartbeat, muscle cramps or muscle pain; pain, tingling, numbness in the hands or feet; seizures  unusual bleeding or bruising  unusually weak or tired Side effects that usually do not require medical attention (report to your doctor or health care professional if they continue or are bothersome):  constipation  diarrhea  headache  joint pain  loss of appetite  muscle pain  runny nose  tiredness  upset stomach This list may not describe all possible side effects. Call your doctor for medical advice about side effects. You may report side effects to FDA at 1-800-FDA-1088. Where should I keep my medicine? This medicine is only given in a clinic, doctor's office, or other health care setting and will not be stored at home. NOTE: This sheet is a summary. It may not cover all possible information. If you have questions about this medicine, talk to your doctor, pharmacist, or health care provider.  2020 Elsevier/Gold Standard (2017-12-10 16:10:44)

## 2019-09-14 ENCOUNTER — Telehealth: Payer: Self-pay | Admitting: Hematology and Oncology

## 2019-09-14 ENCOUNTER — Ambulatory Visit: Payer: PPO

## 2019-09-14 NOTE — Telephone Encounter (Signed)
I talk with patient regarding schedule requested afternoons

## 2019-09-15 ENCOUNTER — Telehealth (INDEPENDENT_AMBULATORY_CARE_PROVIDER_SITE_OTHER): Payer: PPO | Admitting: Internal Medicine

## 2019-09-15 ENCOUNTER — Other Ambulatory Visit: Payer: Self-pay | Admitting: Internal Medicine

## 2019-09-15 ENCOUNTER — Other Ambulatory Visit: Payer: Self-pay

## 2019-09-15 DIAGNOSIS — G8220 Paraplegia, unspecified: Secondary | ICD-10-CM | POA: Diagnosis not present

## 2019-09-15 DIAGNOSIS — M8000XA Age-related osteoporosis with current pathological fracture, unspecified site, initial encounter for fracture: Secondary | ICD-10-CM | POA: Diagnosis not present

## 2019-09-15 MED ORDER — OXYCODONE-ACETAMINOPHEN 10-325 MG PO TABS: 1.0000 | ORAL_TABLET | Freq: Three times a day (TID) | ORAL | 0 refills | Status: AC | PRN

## 2019-09-15 MED ORDER — OXYCODONE-ACETAMINOPHEN 10-325 MG PO TABS: 1.0000 | ORAL_TABLET | Freq: Three times a day (TID) | ORAL | 0 refills | Status: DC | PRN

## 2019-09-15 NOTE — Telephone Encounter (Signed)
Spoke with pharmacist at [facility] to disregard Rx refill.

## 2019-09-15 NOTE — Progress Notes (Signed)
Virtual Visit via Video Note  I connected with Stacy Diaz on 09/15/19 at  1:00 PM EST by a video enabled telemedicine application and verified that I am speaking with the correct person using two identifiers.  Location patient: home Location provider: work office Persons participating in the virtual visit: patient, provider  I discussed the limitations of evaluation and management by telemedicine and the availability of in person appointments. The patient expressed understanding and agreed to proceed.   HPI: This is a scheduled visit for opioid refills per narcotic contract.  She is on oxycodone 10/325 mg, she gets 270 tablets which last her 90 days.  She is paraplegic and wheelchair-bound so going to the drugstore every 30 days is difficult for her.  She has had no changes since I last saw her.  She is feeling well.  She had a nice holiday season with her immediate family.  She had a bone density scan that unfortunately showed worsening osteoporosis.  Her oncologist has decided to take her off letrozole, she continues Prolia.   ROS: Constitutional: Denies fever, chills, diaphoresis, appetite change and fatigue.  HEENT: Denies photophobia, eye pain, redness, hearing loss, ear pain, congestion, sore throat, rhinorrhea, sneezing, mouth sores, trouble swallowing, neck pain, neck stiffness and tinnitus.   Respiratory: Denies SOB, DOE, cough, chest tightness,  and wheezing.   Cardiovascular: Denies chest pain, palpitations and leg swelling.  Gastrointestinal: Denies nausea, vomiting, abdominal pain, diarrhea, constipation, blood in stool and abdominal distention.  Genitourinary: Denies dysuria, urgency, frequency, hematuria, flank pain and difficulty urinating.  Endocrine: Denies: hot or cold intolerance, sweats, changes in hair or nails, polyuria, polydipsia. Musculoskeletal: Denies myalgias, back pain, joint swelling, arthralgias and gait problem.  Skin: Denies pallor, rash and  wound.  Neurological: Denies dizziness, seizures, syncope, weakness, light-headedness, numbness and headaches.  Hematological: Denies adenopathy. Easy bruising, personal or family bleeding history  Psychiatric/Behavioral: Denies suicidal ideation, mood changes, confusion, nervousness, sleep disturbance and agitation   Past Medical History:  Diagnosis Date  . Anxiety   . Arthritis    "probably" (08/25/2012)  . Breast cancer of lower-outer quadrant of left female breast ([place]) 05/25/2014  . Headache(784.0)    "not real frequent" (08/25/2012)  . Heart disease   . HTN (hypertension)   . Hyperlipidemia   . Meningioma ([place]) 1999  . Myocardial infarction ([place]) 1987  . 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 S/P radiation therapy 07/30/14-08/30/13   left breast cancer/50Gy  . Seizures ([place])    no seizures since 2014  . Spastic paraparesis    S/P meningioma resection 1999/notes 08/25/2012  . Stroke ([place])    x 2, the last 2014    Past Surgical History:  Procedure Laterality Date  . BRAIN MENINGIOMA EXCISION  05/1997  . BREAST LUMPECTOMY WITH NEEDLE LOCALIZATION AND AXILLARY SENTINEL LYMPH NODE BX Left 06/22/2014   Procedure: LEFT WIRE LOCALIZED LUMPECTOPMY AND SENTINEL NODE MAPPING;  Surgeon: Autumn Messing III, MD;  Location: [facility];  Service: General;  Laterality: Left;  . CARDIAC CATHETERIZATION  1987  . CORONARY ANGIOPLASTY  1987   denied intervention: "blockage wasn't bad enough"  . TIBIA IM NAIL INSERTION Right 04/26/2018   Procedure: INTRAMEDULLARY (IM) NAIL TIBIAL;  Surgeon: Altamese Tushka, MD;  Location: [facility];  Service: Orthopedics;  Laterality: Right;    Family History  Problem Relation Age of Onset  . Breast cancer Mother 68       unilateral  . Heart  disease Father   . Breast cancer Paternal Grandmother 66       unilateral    SOCIAL HX:   reports that she has quit smoking. Her smoking use included cigarettes. She has a 20.00 pack-year smoking history. She  has never used smokeless tobacco. She reports that she does not drink alcohol or use drugs.   Current Outpatient Medications:  .  atenolol (TENORMIN) 50 MG tablet, TAKE 1 & 1/2 TABLET BY MOUTH TWICE DAILY, Disp: 270 tablet, Rfl: 2 .  atorvastatin (LIPITOR) 10 MG tablet, Take 1 tablet (10 mg total) by mouth daily at 6 PM., Disp: 90 tablet, Rfl: 1 .  baclofen (LIORESAL) 20 MG tablet, TAKE 1 TABLET BY MOUTH 4 TIMES A DAY, Disp: 360 tablet, Rfl: 1 .  clopidogrel (PLAVIX) 75 MG tablet, TAKE 1 TABLET BY MOUTH EVERY DAY, Disp: 90 tablet, Rfl: 1 .  diazepam (VALIUM) 5 MG tablet, TAKE 1 TABLET BY MOUTH EVERYDAY AT BEDTIME, Disp: 90 tablet, Rfl: 1 .  lisinopril (ZESTRIL) 10 MG tablet, TAKE 1 TABLET BY MOUTH DAILY AT BEDTIME, Disp: 90 tablet, Rfl: 0 .  lisinopril (ZESTRIL) 20 MG tablet, TAKE 1 TABLET BY MOUTH TWICE A DAY, Disp: 180 tablet, Rfl: 1 .  Multiple Vitamin (MULTIVITAMIN WITH MINERALS) TABS tablet, Take 1 tablet by mouth every morning. , Disp: , Rfl:  .  naphazoline-pheniramine (NAPHCON-A) 0.025-0.3 % ophthalmic solution, Place 1 drop into both eyes 4 (four) times daily as needed for eye irritation., Disp: 15 mL, Rfl: 0 .  OVER THE COUNTER MEDICATION, Apply 1 application topically daily as needed (pain). Aspercreme with 5% lidocaine, Disp: , Rfl:  .  Oxcarbazepine (TRILEPTAL) 300 MG tablet, TAKE 1 TABLET (300 MG TOTAL) BY MOUTH 2 (TWO) TIMES DAILY., Disp: 180 tablet, Rfl: 1 .  oxyCODONE-acetaminophen (PERCOCET) 10-325 MG tablet, Take 1 tablet by mouth every 8 (eight) hours as needed for pain., Disp: 270 tablet, Rfl: 0 .  polyethylene glycol (MIRALAX / GLYCOLAX) packet, Take 17 g by mouth daily as needed (for constipation). Mix with liquid and drink, Disp: , Rfl:  .  senna (SENOKOT) 8.6 MG TABS tablet, Take 1 tablet by mouth., Disp: , Rfl:  .  tiZANidine (ZANAFLEX) 2 MG tablet, TAKE 1 TABLET BY MOUTH 2 TIMES DAILY, Disp: 180 tablet, Rfl: 1 .  zinc oxide (BALMEX) 11.3 % CREA cream, Apply 1 application  topically at bedtime as needed (IRRITATION). , Disp: , Rfl:   EXAM:   VITALS per patient if applicable: none reported  GENERAL: alert, oriented, appears well and in no acute distress  HEENT: atraumatic, conjunttiva clear, no obvious abnormalities on inspection of external nose and ears, wears corrective lenses  NECK: normal movements of the head and neck  LUNGS: on inspection no signs of respiratory distress, breathing rate appears normal, no obvious gross increased work of breathing, gasping or wheezing  CV: no obvious cyanosis  MS: moves all visible extremities without noticeable abnormality  PSYCH/NEURO: pleasant and cooperative, no obvious depression or anxiety, speech and thought processing grossly intact  ASSESSMENT AND PLAN:   Age-related osteoporosis with current pathological fracture, initial encounter -Her T score has increased to -4.  Her oncologist has decided to keep her off the letrozole and continue with the Prolia injections to see if that improves her bone health.  Paraplegia ([place]) -PDMP reviewed in epic, no red flags, overdose risk score is 250. -Will refill oxycodone 10/325 #270 tablets which should last her 90 days.     I discussed  the assessment and treatment plan with the patient. The patient was provided an opportunity to ask questions and all were answered. The patient agreed with the plan and demonstrated an understanding of the instructions.   The patient was advised to call back or seek an in-person evaluation if the symptoms worsen or if the condition fails to improve as anticipated.    Lelon Frohlich, MD  [facility]

## 2019-09-15 NOTE — Telephone Encounter (Signed)
Pt's medication for Oxycodone was called into the wrong Pharmacy. The correct Pharmacy is [facility] [address], [place], [place] [address]

## 2019-09-18 ENCOUNTER — Other Ambulatory Visit: Payer: Self-pay | Admitting: Internal Medicine

## 2019-09-18 DIAGNOSIS — I10 Essential (primary) hypertension: Secondary | ICD-10-CM

## 2019-09-19 ENCOUNTER — Encounter: Payer: Self-pay | Admitting: Internal Medicine

## 2019-09-22 ENCOUNTER — Ambulatory Visit: Payer: PPO | Attending: Internal Medicine

## 2019-09-22 NOTE — Progress Notes (Unsigned)
   Covid-19 Vaccination Clinic  Name:  Stacy Diaz    MRN: [id number] DOB: 1951/02/24  09/22/2019  [name] was observed post Covid-19 immunization for 15 minutes without incidence. She was provided with Vaccine Information Sheet and instruction to access the V-Safe system.   [name] was instructed to call 911 with any severe reactions post vaccine: Marland Kitchen Difficulty breathing  . Swelling of your face and throat  . A fast heartbeat  . A bad rash all over your body  . Dizziness and weakness

## 2019-09-25 ENCOUNTER — Telehealth: Payer: Self-pay | Admitting: Internal Medicine

## 2019-09-25 NOTE — Chronic Care Management (AMB) (Signed)
  Chronic Care Management   Note  09/25/2019 Name: Stacy Diaz MRN: [id number] DOB: 21-Feb-1951  Stacy Diaz is a 69 y.o. year old female who is a primary care patient of Isaac Bliss, Rayford Halsted, MD. I reached out to [name] by phone today in response to a referral sent by Stacy Diaz's PCP, Isaac Bliss, Rayford Halsted, MD. Patient husband, Stacy Diaz gave verbal consent and will be speaking on the behalf of his wife,Stacy Diaz since he handles her mediacation.   Stacy Diaz was given information about Chronic Care Management services today including:  1. CCM service includes personalized support from designated clinical staff supervised by her physician, including individualized plan of care and coordination with other care providers 2. 24/7 contact phone numbers for assistance for urgent and routine care needs. 3. Service will only be billed when office clinical staff spend 20 minutes or more in a month to coordinate care. 4. Only one practitioner may furnish and bill the service in a calendar month. 5. The patient may stop CCM services at any time (effective at the end of the month) by phone call to the office staff. 6. The patient will be responsible for cost sharing (co-pay) of up to 20% of the service fee (after annual deductible is met).  Patient agreed to services and verbal consent obtained.   Follow up plan:   Raynicia Dukes [employer] Scheduler

## 2019-09-28 ENCOUNTER — Ambulatory Visit: Payer: PPO

## 2019-09-28 ENCOUNTER — Other Ambulatory Visit: Payer: Self-pay

## 2019-09-28 DIAGNOSIS — I10 Essential (primary) hypertension: Secondary | ICD-10-CM

## 2019-09-28 DIAGNOSIS — Z8673 Personal history of transient ischemic attack (TIA), and cerebral infarction without residual deficits: Secondary | ICD-10-CM

## 2019-09-28 NOTE — Patient Instructions (Addendum)
Visit Information  Goals Addressed            This Visit's Progress   . Pharmacy Care Plan       Current Barriers:  . Chronic Disease Management support, education, and care coordination needs related to HTN, HLD, and Osteoporosis, Breast Cancer, Constipation, Seizures, Chronic pain syndrome, Paraplegia    Pharmacist Clinical Goal(s):  . High blood pressure goal: <130/87mHg . Prevent recurrent stroke.  . Continue health diet (salmon, leafy vegetables).   . Cholesterol goals: Total Cholesterol goal under 200, Triglycerides goal under 150, HDL goal above 40 (men) or above 50 (women), LDL goal under 100  Interventions: . Comprehensive medication review performed. . Counseled on increased calcium intake in diet (diary, cheese, yogurt) and continue taking calcium/vitamin D supplement.  . Discussed diet and effect on blood pressure.  . Continue current medications as prescribed by providers.   Patient Self Care Activities:  . Calls provider office for new concerns or questions . Continue at home blood pressure readings.   Initial goal documentation        [name] given information about Chronic Care Management services today including:  1. CCM service includes personalized support from designated clinical staff supervised by her physician, including individualized plan of care and coordination with other care providers 2. 24/7 contact phone numbers for assistance for urgent and routine care needs. 3. Service will only be billed when office clinical staff spend 20 minutes or more in a month to coordinate care. 4. Only one practitioner may furnish and bill the service in a calendar month. 5. The patient may stop CCM services at any time (effective at the end of the month) by phone call to the office staff. 6. The patient will be responsible for cost sharing (co-pay) of up to 20% of the service fee (after annual deductible is met).  Patient agreed to services and verbal  consent obtained.   Print copy of patient instructions provided.  Telephone follow up appointment with pharmacy team member scheduled for:  03/20/2020  AAnson Crofts PharmD Clinical Pharmacist [facility] Osteoporosis causes your bones to become weak and brittle. This puts you at greater risk for bone breaks (fractures) from small bumps or falls. Making changes to your diet and increasing your physical activity can help strengthen your bones and improve your overall health. Calcium and vitamin D are nutrients that play an important role in bone health. Vitamin D helps your body use calcium and strengthen bones. Therefore, it is important to get enough calcium and vitamin D as part of your eating plan for osteoporosis. What are tips for following this plan? Reading food labels  Try to get at least 1,000 milligrams (mg) of calcium each day.  Look for foods that have at least 50 mg of calcium per serving.  Talk with your health care provider about taking a calcium supplement if you do not get enough calcium from food.  Do not have more than 2,500 mg of calcium each day. This is the upper limit for food and nutritional supplements combined. Too much calcium may cause constipation and prevent you from absorbing other important nutrients.  Choose foods that contain vitamin D.  Take a daily vitamin supplement that contains 800-1,000 international units (IU) of vitamin D. The amount may be different depending on your age, body weight, ethnicity, and where you live. Talk with your dietitian or health care provider about how much vitamin  D is right for you.  Avoid foods that have more than 300 mg of sodium per serving. Too much sodium can cause your body to lose calcium.  Talk with your dietitian or health care provider about how much sodium you are allowed each day. Shopping  Do not buy foods with added salt, including: ? Salted  snacks. ? Angie Fava. ? Canned soups. ? Canned meats. ? Processed meats, such as bacon or cold cuts. ? Smoked fish. Meal planning  Eat balanced meals that contain protein foods, fruits and vegetables, and foods rich in calcium and vitamin D.  Eat at least 5 servings of fruits and vegetables each day.  Eat 5-6 oz. of lean meat, poultry, fish, eggs, or beans each day. Lifestyle  Do not use any products that contain nicotine or tobacco, such as cigarettes and e-cigarettes. If you need help quitting, ask your health care provider.  If your health care provider recommends that you lose weight: ? Work with a dietitian to develop an eating plan that will help you reach your desired weight goal. ? Exercise for at least 30 minutes a day, 5 or more days a week, or as told by your health care provider.  Work with a physical therapist to develop an exercise plan that includes flexibility, balance, and strength exercises.  If you drink alcohol, limit how much you have. This means: ? 0-1 drink a day for women. ? 0-2 drinks a day for men. ? Be aware of how much alcohol is in your drink. In the [place] one drink equals one typical bottle of beer (12 oz), one-half glass of wine (5 oz), or one shot of hard liquor (1 oz). What foods should I eat? Foods high in calcium   Yogurt. Yogurt with fruit.  Milk. Evaporated skim milk. Dry milk powder.  Calcium-fortified orange juice.  Parmesan cheese. Part-skim ricotta cheese. Natural hard cheese. Cream cheese. Cottage cheese.  Canned sardines. Canned salmon.  Calcium-treated tofu. Calcium-fortified cereal bar. Calcium-fortified cereal. Calcium-fortified graham crackers.  Cooked collard greens. Turnip greens. Broccoli. Kale.  Almonds.  White beans.  Corn tortilla. Foods high in vitamin D  Cod liver oil. Fatty fish, such as tuna, mackerel, and salmon.  Milk. Fortified soy milk. Fortified fruit juice.  Yogurt. Margarine.  Egg yolks. Foods  high in protein  Beef. Lamb. Pork tenderloin.  Chicken breast.  Tuna (canned). Fish fillet.  Tofu.  Soy beans (cooked). Soy patty. Beans (canned or cooked).  Cottage cheese.  Yogurt.  Peanut butter.  Pumpkin seeds. Nuts. Sunflower seeds.  Hard cheese.  Milk or other milk products, such as soy milk. The items listed above may not be a complete list of foods and beverages you can eat. Contact a dietitian for more options. Summary  Calcium and vitamin D are nutrients that play an important role in bone health and are an important part of your eating plan for osteoporosis.  Eat balanced meals that contain protein foods, fruits and vegetables, and foods rich in calcium and vitamin D.  Avoid foods that have more than 300 mg of sodium per serving. Too much sodium can cause your body to lose calcium.  Exercise is an important part of prevention and treatment of osteoporosis. Aim for at least 30 minutes a day, 5 days a week. This information is not intended to replace advice given to you by your health care provider. Make sure you discuss any questions you have with your health care provider. Document Revised: 10/11/2017 Document Reviewed: 10/11/2017  Elsevier Patient Education  [employer].  Managing Your Hypertension Hypertension is commonly called high blood pressure. This is when the force of your blood pressing against the walls of your arteries is too strong. Arteries are blood vessels that carry blood from your heart throughout your body. Hypertension forces the heart to work harder to pump blood, and may cause the arteries to become narrow or stiff. Having untreated or uncontrolled hypertension can cause heart attack, stroke, kidney disease, and other problems. What are blood pressure readings? A blood pressure reading consists of a higher number over a lower number. Ideally, your blood pressure should be below 120/80. The first ("top") number is called the systolic  pressure. It is a measure of the pressure in your arteries as your heart beats. The second ("bottom") number is called the diastolic pressure. It is a measure of the pressure in your arteries as the heart relaxes. What does my blood pressure reading mean? Blood pressure is classified into four stages. Based on your blood pressure reading, your health care provider may use the following stages to determine what type of treatment you need, if any. Systolic pressure and diastolic pressure are measured in a unit called mm Hg. Normal  Systolic pressure: below 592.  Diastolic pressure: below 80. Elevated  Systolic pressure: 924-462.  Diastolic pressure: below 80. Hypertension stage 1  Systolic pressure: 863-817.  Diastolic pressure: 71-16. Hypertension stage 2  Systolic pressure: 579 or above.  Diastolic pressure: 90 or above. What health risks are associated with hypertension? Managing your hypertension is an important responsibility. Uncontrolled hypertension can lead to:  A heart attack.  A stroke.  A weakened blood vessel (aneurysm).  Heart failure.  Kidney damage.  Eye damage.  Metabolic syndrome.  Memory and concentration problems. What changes can I make to manage my hypertension? Hypertension can be managed by making lifestyle changes and possibly by taking medicines. Your health care provider will help you make a plan to bring your blood pressure within a normal range. Eating and drinking   Eat a diet that is high in fiber and potassium, and low in salt (sodium), added sugar, and fat. An example eating plan is called the DASH (Dietary Approaches to Stop Hypertension) diet. To eat this way: ? Eat plenty of fresh fruits and vegetables. Try to fill half of your plate at each meal with fruits and vegetables. ? Eat whole grains, such as whole wheat pasta, brown rice, or whole grain bread. Fill about one quarter of your plate with whole grains. ? Eat low-fat diary  products. ? Avoid fatty cuts of meat, processed or cured meats, and poultry with skin. Fill about one quarter of your plate with lean proteins such as fish, chicken without skin, beans, eggs, and tofu. ? Avoid premade and processed foods. These tend to be higher in sodium, added sugar, and fat.  Reduce your daily sodium intake. Most people with hypertension should eat less than 1,500 mg of sodium a day.  Limit alcohol intake to no more than 1 drink a day for nonpregnant women and 2 drinks a day for men. One drink equals 12 oz of beer, 5 oz of wine, or 1 oz of hard liquor. Lifestyle  Work with your health care provider to maintain a healthy body weight, or to lose weight. Ask what an ideal weight is for you.  Get at least 30 minutes of exercise that causes your heart to beat faster (aerobic exercise) most days of the week. Activities  may include walking, swimming, or biking.  Include exercise to strengthen your muscles (resistance exercise), such as weight lifting, as part of your weekly exercise routine. Try to do these types of exercises for 30 minutes at least 3 days a week.  Do not use any products that contain nicotine or tobacco, such as cigarettes and e-cigarettes. If you need help quitting, ask your health care provider.  Control any long-term (chronic) conditions you have, such as high cholesterol or diabetes. Monitoring  Monitor your blood pressure at home as told by your health care provider. Your personal target blood pressure may vary depending on your medical conditions, your age, and other factors.  Have your blood pressure checked regularly, as often as told by your health care provider. Working with your health care provider  Review all the medicines you take with your health care provider because there may be side effects or interactions.  Talk with your health care provider about your diet, exercise habits, and other lifestyle factors that may be contributing to  hypertension.  Visit your health care provider regularly. Your health care provider can help you create and adjust your plan for managing hypertension. Will I need medicine to control my blood pressure? Your health care provider may prescribe medicine if lifestyle changes are not enough to get your blood pressure under control, and if:  Your systolic blood pressure is 130 or higher.  Your diastolic blood pressure is 80 or higher. Take medicines only as told by your health care provider. Follow the directions carefully. Blood pressure medicines must be taken as prescribed. The medicine does not work as well when you skip doses. Skipping doses also puts you at risk for problems. Contact a health care provider if:  You think you are having a reaction to medicines you have taken.  You have repeated (recurrent) headaches.  You feel dizzy.  You have swelling in your ankles.  You have trouble with your vision. Get help right away if:  You develop a severe headache or confusion.  You have unusual weakness or numbness, or you feel faint.  You have severe pain in your chest or abdomen.  You vomit repeatedly.  You have trouble breathing. Summary  Hypertension is when the force of blood pumping through your arteries is too strong. If this condition is not controlled, it may put you at risk for serious complications.  Your personal target blood pressure may vary depending on your medical conditions, your age, and other factors. For most people, a normal blood pressure is less than 120/80.  Hypertension is managed by lifestyle changes, medicines, or both. Lifestyle changes include weight loss, eating a healthy, low-sodium diet, exercising more, and limiting alcohol. This information is not intended to replace advice given to you by your health care provider. Make sure you discuss any questions you have with your health care provider. Document Revised: 11/25/2018 Document Reviewed:  07/01/2016 Elsevier Patient Education  [employer].

## 2019-09-28 NOTE — Chronic Care Management (AMB) (Signed)
Chronic Care Management Pharmacy  Name: Stacy Diaz  MRN: [id number] DOB: [birth year]  Initial Questions: 1. Have you seen any other providers since your last visit? No  2. Any changes in your medicines or health? No   Chief Complaint/ HPI  Stacy Diaz,  69 y.o. , female presents for their Initial CCM visit with the clinical pharmacist via telephone.  PCP : Isaac Bliss, Rayford Halsted, MD  Their chronic conditions include: HTN, Osteoporosis, Hyperlipidemia, Breast cancer, Constipation, Seizures, Chronic pain syndrome/ paraplegia  Office Visits: 09/15/2019- Patient presented to Dr. Lelon Frohlich, MD via virtual visit for osteoporosis. T score increased to -4. Oncologist took patient off letrozole and continued Prolia injections. Oxycodone 10/325mg  refilled for 90 days.    06/20/2019- Patient presented to Dr. Lelon Frohlich, MD via virtual visit for paraplegia. Patient is on chronic opioids due to chronic pain syndrome, history of CVA, paraplegia. Patient prescribed oxycodone/APAP 10/325mg  q8 hours PRN pain for 3 months.   04/21/2019- Patient presented to Dr. Lelon Frohlich, MD in office for redness of right eye. Patient using red eye solution and states improvement in symptoms. No need for antibiotic drops/ointment. Patient advised to use Natural Tears in gel formation.   Consult Visit: 09/13/2019- Oncology- Patient presented to Dr. Nicholas Lose, MD in office for malignant neoplasm on left breast. Patient switched from anastrozole to letrozole, but eventually letrozole discontinued due to worsening osteoporosis. T score -4. Patient to continue Prolia every 6 months and return for follow up in 1 year.   Medications: Outpatient Encounter Medications as of 09/28/2019  Medication Sig  . atenolol (TENORMIN) 50 MG tablet TAKE 1 & 1/2 TABLET BY MOUTH TWICE DAILY  . atorvastatin (LIPITOR) 10 MG tablet Take 1 tablet (10 mg total) by mouth daily at 6 PM.  .  baclofen (LIORESAL) 20 MG tablet TAKE 1 TABLET BY MOUTH 4 TIMES A DAY  . Calcium Carb-Cholecalciferol (CALCIUM CARBONATE-VITAMIN D3 PO) Take by mouth. 600mg / 2500 IU, patient takes 2 tablets daily  . clopidogrel (PLAVIX) 75 MG tablet TAKE 1 TABLET BY MOUTH EVERY DAY  . diazepam (VALIUM) 5 MG tablet TAKE 1 TABLET BY MOUTH EVERYDAY AT BEDTIME  . lisinopril (ZESTRIL) 10 MG tablet TAKE 1 TABLET BY MOUTH EVERYDAY AT BEDTIME  . lisinopril (ZESTRIL) 20 MG tablet TAKE 1 TABLET BY MOUTH TWICE A DAY  . Multiple Vitamin (MULTIVITAMIN WITH MINERALS) TABS tablet Take 1 tablet by mouth every morning.   . Oxcarbazepine (TRILEPTAL) 300 MG tablet TAKE 1 TABLET (300 MG TOTAL) BY MOUTH 2 (TWO) TIMES DAILY.  Marland Kitchen oxyCODONE-acetaminophen (PERCOCET) 10-325 MG tablet Take 1 tablet by mouth every 8 (eight) hours as needed for pain.  . polyethylene glycol (MIRALAX / GLYCOLAX) packet Take 17 g by mouth daily as needed (for constipation). Mix with liquid and drink  . tiZANidine (ZANAFLEX) 2 MG tablet TAKE 1 TABLET BY MOUTH 2 TIMES DAILY  . zinc oxide (BALMEX) 11.3 % CREA cream Apply 1 application topically at bedtime as needed (IRRITATION).   . naphazoline-pheniramine (NAPHCON-A) 0.025-0.3 % ophthalmic solution Place 1 drop into both eyes 4 (four) times daily as needed for eye irritation.  Marland Kitchen OVER THE COUNTER MEDICATION Apply 1 application topically daily as needed (pain). Aspercreme with 5% lidocaine  . senna (SENOKOT) 8.6 MG TABS tablet Take 1 tablet by mouth.   No facility-administered encounter medications on file as of 09/28/2019.     Current Diagnosis/Assessment:  Goals Addressed  This Visit's Progress   . Pharmacy Care Plan       Current Barriers:  . Chronic Disease Management support, education, and care coordination needs related to HTN, HLD, and Osteoporosis, Breast Cancer, Constipation, Seizures, Chronic pain syndrome, Paraplegia    Pharmacist Clinical Goal(s):  . High blood pressure goal:  <130/39mmHg . Prevent recurrent stroke.  . Continue health diet (salmon, leafy vegetables).   . Cholesterol goals: Total Cholesterol goal under 200, Triglycerides goal under 150, HDL goal above 40 (men) or above 50 (women), LDL goal under 100  Interventions: . Comprehensive medication review performed. . Counseled on increased calcium intake in diet (diary, cheese, yogurt) and continue taking calcium/vitamin D supplement.  . Discussed diet and effect on blood pressure.  . Continue current medications as prescribed by providers.   Patient Self Care Activities:  . Calls provider office for new concerns or questions . Continue at home blood pressure readings.   Initial goal documentation        Hypertension  BP today is:  <130/80  Office blood pressures are  BP Readings from Last 3 Encounters:  09/13/19 (!) 152/72  09/13/19 (!) 163/84  04/21/19 110/70    Patient has failed these meds in the past: quinapril  Patient checks BP at home daily.   Patient home BP readings are ranging: 125-130s/70-80 mmHg.   Patient uncontrolled/ controlled on: atenolol 50mg , 1.5 tablets twice daily,lisinopril 20mg , 1 tablet twice daily in addition to  lisinopril 10mg , 1 tablet at bedtime  We discussed reducing sodium intake, diet.(salmon, leafy vegetables).   Plan Continue current medications and diet modifications.    Hyperlipidemia   Lipid Panel     Component Value Date/Time   CHOL 154 06/03/2016 0408   TRIG 99 06/03/2016 0408   HDL 55 06/03/2016 0408   CHOLHDL 2.8 06/03/2016 0408   VLDL 20 06/03/2016 0408   LDLCALC 79 06/03/2016 0408   LDLDIRECT 142.6 03/26/2011 0832     ASCVD 10-year risk: 9.5% (estimated)   Patient has failed these meds in past: simvastatin Patient is currently controlled on the following medications: atorvastatin 10mg , 1 tablet once daily, clopidogrel 75mg , 1 tablet daily.   We discussed:  diet modifications.  Plan Continue current medications and diet  modifications.  Patient due for updated lipid panel. Recommend at next office visit.    Breast Cancer of left breast  Patient has failed these meds in past: Anastrozole, Letrozole (from Nov 2016 to 09/13/19, due to worsening osteoporosis)  Patient is currently managed on the following medications:none  Plan Managed by oncology.  Osteoporosis  Patient has failed these meds in past: none Patient is currently managed on the following medications: Prolia every 6 months. (last Jan. 27th, 2021).   T score: -4   We discussed:  increase calcium intake in diet.   Plan Continue current medications and diet modifications.  Chronic pain syndrome/paraplegia  Spouse gave history of patient. 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 She has been in a wheelchair in 2007. In 2009, she had a fall and has not improved since then. 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 She has been with therapy, but still  very weak from left side. Patient has orthotics and uses pivot desk to turn. Per spouse, patient has intermittent spasticity  in her legs.    Patient has failed these meds in past: Robaxin  Patient is currently controlled on the following medications: Oxycodone/ APAP 10/325mg , 1  tablet every 8 hours as needed for pain, Baclofen 20mg , 1 tablet four times daily, Tizanidine 2mg , 1 tablet twice daily, and Diazepam 5mg , 1 tablet at bedtime.  We discussed: increased CNS depression with medication combination.   Plan Spouse describes patient doing better on tizanidine in addition to baclofen and patient is controlled on oxycodone/ APAP 10/325mg , 1 tablet every 8 hours.  Continue current medications.    Constipation   Patient is currently controlled on the following medications: senna/ docusate, and Miralax.   Plan Spouse states patient takes stool softener every other day and has been helping with constipation. Patient will take a dose  of Miralax about once a week. Continue current medications.   Seizures  Patient has failed these meds in past: none Patient is currently managed on the following medications: Oxcarbazepine 300mg , 1 tablet twice daily.   Plan Spouse states seizure-like symptoms last 1 to 5 minutes and have occurred three times in the last 5 months. Managed by neurologist. Continue current medications.    Medication Management  Spouse organizes medications in weekly pill box. Medications [facility] at [facility], except Oxycodone/[facility]AP is filled at [facility] for 3 months due to cost.  Medications are delivered from [facility], except diazepam.  Cost comparison: Between [facility] and [facility] per spouse request, for oxycodone/ APAP for 3 months supply. #270. At [facility], [id number]. [facility]: $180. [facility] at least $110.    Follow up Patient due for updated lipid panel. Recommend at next office visit.  Follow up appointment in 6 months.  Telephone follow up in 2 months for general assessment.    Anson Crofts, PharmD Clinical Pharmacist [facility] [place] [phone]

## 2019-09-29 ENCOUNTER — Other Ambulatory Visit: Payer: Self-pay | Admitting: Internal Medicine

## 2019-09-29 DIAGNOSIS — G839 Paralytic syndrome, unspecified: Secondary | ICD-10-CM

## 2019-09-29 DIAGNOSIS — R569 Unspecified convulsions: Secondary | ICD-10-CM

## 2019-09-29 DIAGNOSIS — G8220 Paraplegia, unspecified: Secondary | ICD-10-CM

## 2019-09-29 DIAGNOSIS — E785 Hyperlipidemia, unspecified: Secondary | ICD-10-CM

## 2019-09-29 DIAGNOSIS — Z8673 Personal history of transient ischemic attack (TIA), and cerebral infarction without residual deficits: Secondary | ICD-10-CM

## 2019-09-29 DIAGNOSIS — I10 Essential (primary) hypertension: Secondary | ICD-10-CM

## 2019-10-05 ENCOUNTER — Ambulatory Visit: Payer: PPO

## 2019-10-18 ENCOUNTER — Ambulatory Visit: Payer: PPO | Attending: Internal Medicine

## 2019-10-18 DIAGNOSIS — Z23 Encounter for immunization: Secondary | ICD-10-CM | POA: Insufficient documentation

## 2019-10-18 NOTE — Progress Notes (Signed)
   Covid-19 Vaccination Clinic  Name:  Stacy Diaz    MRN: [id number] DOB: 1951-06-17  10/18/2019  Ms. Schellhammer was observed post Covid-19 immunization for 15 minutes without incident. She was provided with Vaccine Information Sheet and instruction to access the V-Safe system.   Ms. Gatzemeyer was instructed to call 911 with any severe reactions post vaccine: Marland Kitchen Difficulty breathing  . Swelling of face and throat  . A fast heartbeat  . A bad rash all over body  . Dizziness and weakness   Immunizations Administered    Name Date Dose VIS Date Route   Pfizer COVID-19 Vaccine 10/18/2019  1:45 PM 0.3 mL 07/28/2019 Intramuscular   Manufacturer: [place]   Lot: [id number]   [place]: [id number]

## 2019-11-05 ENCOUNTER — Encounter: Payer: Self-pay | Admitting: Internal Medicine

## 2019-11-05 DIAGNOSIS — R569 Unspecified convulsions: Secondary | ICD-10-CM

## 2019-11-05 DIAGNOSIS — G8220 Paraplegia, unspecified: Secondary | ICD-10-CM

## 2019-11-05 DIAGNOSIS — I10 Essential (primary) hypertension: Secondary | ICD-10-CM

## 2019-11-09 MED ORDER — LISINOPRIL 10 MG PO TABS: ORAL_TABLET | ORAL | 1 refills | Status: DC

## 2019-11-09 MED ORDER — OXCARBAZEPINE 300 MG PO TABS: 300.0000 mg | ORAL_TABLET | Freq: Two times a day (BID) | ORAL | 1 refills | Status: DC

## 2019-11-09 MED ORDER — TIZANIDINE HCL 2 MG PO TABS: 2.0000 mg | ORAL_TABLET | Freq: Two times a day (BID) | ORAL | 1 refills | Status: DC

## 2019-11-09 MED ORDER — LISINOPRIL 20 MG PO TABS: 20.0000 mg | ORAL_TABLET | Freq: Two times a day (BID) | ORAL | 1 refills | Status: DC

## 2019-11-13 ENCOUNTER — Telehealth: Payer: Self-pay | Admitting: Internal Medicine

## 2019-11-13 NOTE — Telephone Encounter (Signed)
Hope from pharmacy received and prescription for liosinopril 10mg  and 20mg  she wants to confirm if pt is taking both or is stopping one.   [id number]  Ref [id number]

## 2019-11-14 NOTE — Telephone Encounter (Signed)
Spoke with pharmacist Arbie Cookey and medication clarification given.

## 2019-11-14 NOTE — Telephone Encounter (Signed)
Stacy Diaz from [facility] called about previous message, needing clarification on pt prescription.

## 2019-11-27 ENCOUNTER — Telehealth: Payer: Self-pay | Admitting: Internal Medicine

## 2019-11-27 NOTE — Telephone Encounter (Signed)
Pt's husband, [name], would like an order for pt to have an x ray on right lower leg. Pt caught her foot and twisted her foot. [name], is aware that you are out of the office today and will return on 11/28/19. Thanks

## 2019-11-28 NOTE — Telephone Encounter (Signed)
Spoke with husband and [facility] has a portable x-ray and he will try to schedule with them.  If that is not possible he will call back to schedule a virtual appointment.

## 2019-11-28 NOTE — Telephone Encounter (Signed)
Need OV to determine what is needed. We can try and fit her in if the schedule is full. Virtual is probably ok.

## 2019-11-29 ENCOUNTER — Encounter: Payer: Self-pay | Admitting: Internal Medicine

## 2019-11-29 DIAGNOSIS — M25571 Pain in right ankle and joints of right foot: Secondary | ICD-10-CM | POA: Diagnosis not present

## 2019-12-17 ENCOUNTER — Encounter: Payer: Self-pay | Admitting: Internal Medicine

## 2019-12-20 ENCOUNTER — Telehealth (INDEPENDENT_AMBULATORY_CARE_PROVIDER_SITE_OTHER): Payer: PPO | Admitting: Internal Medicine

## 2019-12-20 DIAGNOSIS — G8220 Paraplegia, unspecified: Secondary | ICD-10-CM | POA: Diagnosis not present

## 2019-12-20 MED ORDER — OXYCODONE-ACETAMINOPHEN 10-325 MG PO TABS: 1.0000 | ORAL_TABLET | Freq: Three times a day (TID) | ORAL | 0 refills | Status: DC | PRN

## 2019-12-20 NOTE — Progress Notes (Signed)
Virtual Visit via Video Note  I connected with Stacy Diaz on 12/20/19 at  3:30 PM EDT by a video enabled telemedicine application and verified that I am speaking with the correct person using two identifiers.  Location patient: home Location provider: work office Persons participating in the virtual visit: patient, provider  I discussed the limitations of evaluation and management by telemedicine and the availability of in person appointments. The patient expressed understanding and agreed to proceed.   HPI: This is a scheduled visit for opioid refills per narcotic contract.  She is on oxycodone 10/325 mg, she gets 270 tablets which last her 90 days.  She is paraplegic and wheelchair-bound so going to the drugstore every 30 days is difficult for her.   She had a bone density scan that unfortunately showed worsening osteoporosis.  Her oncologist has decided to take her off letrozole, she continues Prolia.  Since I last saw her she suffered a fall with a right foot fracture and is currently in a cam boot, will follow up with Ortho in 6 weeks.   ROS: Constitutional: Denies fever, chills, diaphoresis, appetite change and fatigue.  HEENT: Denies photophobia, eye pain, redness, hearing loss, ear pain, congestion, sore throat, rhinorrhea, sneezing, mouth sores, trouble swallowing, neck pain, neck stiffness and tinnitus.   Respiratory: Denies SOB, DOE, cough, chest tightness,  and wheezing.   Cardiovascular: Denies chest pain, palpitations and leg swelling.  Gastrointestinal: Denies nausea, vomiting, abdominal pain, diarrhea, constipation, blood in stool and abdominal distention.  Genitourinary: Denies dysuria, urgency, frequency, hematuria, flank pain and difficulty urinating.  Endocrine: Denies: hot or cold intolerance, sweats, changes in hair or nails, polyuria, polydipsia. Musculoskeletal: Denies myalgias, back pain, joint swelling, arthralgias and gait problem.  Skin: Denies pallor,  rash and wound.  Neurological: Denies dizziness, seizures, syncope, weakness, light-headedness, numbness and headaches.  Hematological: Denies adenopathy. Easy bruising, personal or family bleeding history  Psychiatric/Behavioral: Denies suicidal ideation, mood changes, confusion, nervousness, sleep disturbance and agitation   Past Medical History:  Diagnosis Date  . Anxiety   . Arthritis    "probably" (08/25/2012)  . Breast cancer of lower-outer quadrant of left female breast ([place]) 05/25/2014  . Headache(784.0)    "not real frequent" (08/25/2012)  . Heart disease   . HTN (hypertension)   . Hyperlipidemia   . Meningioma ([place]) 1999  . Myocardial infarction ([place]) 1987  . 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 S/P radiation therapy 07/30/14-08/30/13   left breast cancer/50Gy  . Seizures ([place])    no seizures since 2014  . Spastic paraparesis    S/P meningioma resection 1999/notes 08/25/2012  . Stroke ([place])    x 2, the last 2014    Past Surgical History:  Procedure Laterality Date  . BRAIN MENINGIOMA EXCISION  05/1997  . BREAST LUMPECTOMY WITH NEEDLE LOCALIZATION AND AXILLARY SENTINEL LYMPH NODE BX Left 06/22/2014   Procedure: LEFT WIRE LOCALIZED LUMPECTOPMY AND SENTINEL NODE MAPPING;  Surgeon: Autumn Messing III, MD;  Location: [facility];  Service: General;  Laterality: Left;  . CARDIAC CATHETERIZATION  1987  . CORONARY ANGIOPLASTY  1987   denied intervention: "blockage wasn't bad enough"  . TIBIA IM NAIL INSERTION Right 04/26/2018   Procedure: INTRAMEDULLARY (IM) NAIL TIBIAL;  Surgeon: Altamese Keystone, MD;  Location: [facility];  Service: Orthopedics;  Laterality: Right;    Family History  Problem Relation Age of Onset  . Breast cancer Mother 44  unilateral  . Heart disease Father   . Breast cancer Paternal Grandmother 9       unilateral    SOCIAL HX:   reports that she has quit smoking. Her smoking use included cigarettes. She has a 20.00 pack-year smoking  history. She has never used smokeless tobacco. She reports that she does not drink alcohol or use drugs.   Current Outpatient Medications:  .  atenolol (TENORMIN) 50 MG tablet, TAKE 1 & 1/2 TABLET BY MOUTH TWICE DAILY, Disp: 270 tablet, Rfl: 2 .  atorvastatin (LIPITOR) 10 MG tablet, Take 1 tablet (10 mg total) by mouth daily at 6 PM., Disp: 90 tablet, Rfl: 1 .  baclofen (LIORESAL) 20 MG tablet, TAKE 1 TABLET BY MOUTH 4 TIMES A DAY, Disp: 360 tablet, Rfl: 1 .  Calcium Carb-Cholecalciferol (CALCIUM CARBONATE-VITAMIN D3 PO), Take by mouth. 600mg / 2500 IU, patient takes 2 tablets daily, Disp: , Rfl:  .  clopidogrel (PLAVIX) 75 MG tablet, TAKE 1 TABLET BY MOUTH EVERY DAY, Disp: 90 tablet, Rfl: 1 .  diazepam (VALIUM) 5 MG tablet, TAKE 1 TABLET BY MOUTH EVERYDAY AT BEDTIME, Disp: 90 tablet, Rfl: 1 .  lisinopril (ZESTRIL) 10 MG tablet, TAKE 1 TABLET BY MOUTH EVERYDAY AT BEDTIME, Disp: 90 tablet, Rfl: 1 .  lisinopril (ZESTRIL) 20 MG tablet, Take 1 tablet (20 mg total) by mouth 2 (two) times daily., Disp: 180 tablet, Rfl: 1 .  Multiple Vitamin (MULTIVITAMIN WITH MINERALS) TABS tablet, Take 1 tablet by mouth every morning. , Disp: , Rfl:  .  OVER THE COUNTER MEDICATION, Apply 1 application topically daily as needed (pain). Aspercreme with 5% lidocaine, Disp: , Rfl:  .  Oxcarbazepine (TRILEPTAL) 300 MG tablet, Take 1 tablet (300 mg total) by mouth 2 (two) times daily., Disp: 180 tablet, Rfl: 1 .  oxyCODONE-acetaminophen (PERCOCET) 10-325 MG tablet, Take 1 tablet by mouth every 8 (eight) hours as needed for pain., Disp: 270 tablet, Rfl: 0 .  polyethylene glycol (MIRALAX / GLYCOLAX) packet, Take 17 g by mouth daily as needed (for constipation). Mix with liquid and drink, Disp: , Rfl:  .  tiZANidine (ZANAFLEX) 2 MG tablet, Take 1 tablet (2 mg total) by mouth 2 (two) times daily., Disp: 180 tablet, Rfl: 1 .  zinc oxide (BALMEX) 11.3 % CREA cream, Apply 1 application topically at bedtime as needed (IRRITATION). ,  Disp: , Rfl:   EXAM:   VITALS per patient if applicable: Blood pressure 123/72  GENERAL: alert, oriented, appears well and in no acute distress  HEENT: atraumatic, conjunttiva clear, no obvious abnormalities on inspection of external nose and ears, wears corrective lenses  NECK: normal movements of the head and neck  LUNGS: on inspection no signs of respiratory distress, breathing rate appears normal, no obvious gross increased work of breathing, gasping or wheezing  CV: no obvious cyanosis  MS: moves all visible extremities without noticeable abnormality  PSYCH/NEURO: pleasant and cooperative, no obvious depression or anxiety, speech and thought processing grossly intact  ASSESSMENT AND PLAN:   Paraplegia (HCC)  -PDMP reviewed, no red flags, overdose risk score is 300. -Refill oxycodone 10/325 #270 which should last 90 days.   I discussed the assessment and treatment plan with the patient. The patient was provided an opportunity to ask questions and all were answered. The patient agreed with the plan and demonstrated an understanding of the instructions.   The patient was advised to call back or seek an in-person evaluation if the symptoms worsen or if the condition  fails to improve as anticipated.    Lelon Frohlich, MD  [facility] at [facility]

## 2019-12-26 ENCOUNTER — Telehealth: Payer: Self-pay | Admitting: Internal Medicine

## 2020-02-26 ENCOUNTER — Encounter: Payer: Self-pay | Admitting: Internal Medicine

## 2020-02-26 ENCOUNTER — Telehealth: Payer: Self-pay | Admitting: Internal Medicine

## 2020-02-26 DIAGNOSIS — G8220 Paraplegia, unspecified: Secondary | ICD-10-CM

## 2020-02-26 NOTE — Telephone Encounter (Signed)
Pt spouse call and stated she need a refill on diazepam (VALIUM) 5 MG tablet sent to  [facility] [address] [place], [place] Phone:  [phone]  Fax:  [phone]

## 2020-02-27 MED ORDER — DIAZEPAM 5 MG PO TABS: ORAL_TABLET | ORAL | 1 refills | Status: DC

## 2020-02-27 NOTE — Telephone Encounter (Signed)
Refill sent.

## 2020-03-08 ENCOUNTER — Other Ambulatory Visit: Payer: Self-pay

## 2020-03-08 ENCOUNTER — Encounter: Payer: Self-pay | Admitting: Internal Medicine

## 2020-03-08 ENCOUNTER — Ambulatory Visit (INDEPENDENT_AMBULATORY_CARE_PROVIDER_SITE_OTHER): Payer: PPO | Admitting: Internal Medicine

## 2020-03-08 VITALS — BP 120/80 | HR 110 | Temp 98.3°F | Ht 62.0 in | Wt 190.0 lb

## 2020-03-08 DIAGNOSIS — R32 Unspecified urinary incontinence: Secondary | ICD-10-CM

## 2020-03-08 DIAGNOSIS — I10 Essential (primary) hypertension: Secondary | ICD-10-CM

## 2020-03-08 DIAGNOSIS — E785 Hyperlipidemia, unspecified: Secondary | ICD-10-CM

## 2020-03-08 DIAGNOSIS — G8220 Paraplegia, unspecified: Secondary | ICD-10-CM

## 2020-03-08 MED ORDER — OXYCODONE-ACETAMINOPHEN 10-325 MG PO TABS: 1.0000 | ORAL_TABLET | Freq: Three times a day (TID) | ORAL | 0 refills | Status: DC | PRN

## 2020-03-08 NOTE — Progress Notes (Signed)
Established Patient Office Visit     This visit occurred during the SARS-CoV-2 public health emergency.  Safety protocols were in place, including screening questions prior to the visit, additional usage of staff PPE, and extensive cleaning of exam room while observing appropriate contact time as indicated for disinfecting solutions.    CC/Reason for Visit: Needs a face-to-face for a new power wheelchair  HPI: AZADEH HYDER is a 69 y.o. female who is coming in today for the above mentioned reasons. I have not seen Coralyn Mark in office for some time. She has recurrent visits every 3 months for narcotic refills. She has a complex medical history most significant at this time for paraplegia due to prior CVA and brain tumor. She is in a power wheelchair that is due for upgrade and home health company requires a face-to-face. Her weight is 190 pounds and her height is 62 inches. She is also due for her medication refills. She is on oxycodone that she takes 3 times a day. Since she is here today we will obtain blood work. Her past medical history is also significant for hypertension, osteoporosis, hyperlipidemia.   Past Medical/Surgical History: Past Medical History:  Diagnosis Date  . Anxiety   . Arthritis    "probably" (08/25/2012)  . Breast cancer of lower-outer quadrant of left female breast ([place]) 05/25/2014  . Headache(784.0)    "not real frequent" (08/25/2012)  . Heart disease   . HTN (hypertension)   . Hyperlipidemia   . Meningioma ([place]) 1999  . Myocardial infarction ([place]) 1987  . 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 S/P radiation therapy 07/30/14-08/30/13   left breast cancer/50Gy  . Seizures ([place])    no seizures since 2014  . Spastic paraparesis    S/P meningioma resection 1999/notes 08/25/2012  . Stroke ([place])    x 2, the last 2014    Past Surgical History:  Procedure Laterality Date  . BRAIN MENINGIOMA EXCISION  05/1997  . BREAST LUMPECTOMY WITH  NEEDLE LOCALIZATION AND AXILLARY SENTINEL LYMPH NODE BX Left 06/22/2014   Procedure: LEFT WIRE LOCALIZED LUMPECTOPMY AND SENTINEL NODE MAPPING;  Surgeon: Autumn Messing III, MD;  Location: [facility];  Service: General;  Laterality: Left;  . CARDIAC CATHETERIZATION  1987  . CORONARY ANGIOPLASTY  1987   denied intervention: "blockage wasn't bad enough"  . TIBIA IM NAIL INSERTION Right 04/26/2018   Procedure: INTRAMEDULLARY (IM) NAIL TIBIAL;  Surgeon: Altamese Aurora, MD;  Location: [facility];  Service: Orthopedics;  Laterality: Right;    Social History:  reports that she has quit smoking. Her smoking use included cigarettes. She has a 20.00 pack-year smoking history. She has never used smokeless tobacco. She reports that she does not drink alcohol and does not use drugs.  Allergies: No Known Allergies  Family History:  Family History  Problem Relation Age of Onset  . Breast cancer Mother 78       unilateral  . Heart disease Father   . Breast cancer Paternal Grandmother 75       unilateral     Current Outpatient Medications:  .  atenolol (TENORMIN) 50 MG tablet, TAKE 1 & 1/2 TABLET BY MOUTH TWICE DAILY, Disp: 270 tablet, Rfl: 2 .  atorvastatin (LIPITOR) 10 MG tablet, Take 1 tablet (10 mg total) by mouth daily at 6 PM., Disp: 90 tablet, Rfl: 1 .  baclofen (LIORESAL) 20 MG tablet, TAKE 1 TABLET BY MOUTH 4 TIMES A DAY, Disp: 360  tablet, Rfl: 1 .  Calcium Carb-Cholecalciferol (CALCIUM CARBONATE-VITAMIN D3 PO), Take by mouth. 600mg / 2500 IU, patient takes 2 tablets daily, Disp: , Rfl:  .  clopidogrel (PLAVIX) 75 MG tablet, TAKE 1 TABLET BY MOUTH EVERY DAY, Disp: 90 tablet, Rfl: 1 .  diazepam (VALIUM) 5 MG tablet, TAKE 1 TABLET BY MOUTH EVERYDAY AT BEDTIME, Disp: 90 tablet, Rfl: 1 .  lisinopril (ZESTRIL) 10 MG tablet, TAKE 1 TABLET BY MOUTH EVERYDAY AT BEDTIME, Disp: 90 tablet, Rfl: 1 .  lisinopril (ZESTRIL) 20 MG tablet, Take 1 tablet (20 mg total) by mouth 2 (two) times daily., Disp: 180 tablet, Rfl: 1 .   Multiple Vitamin (MULTIVITAMIN WITH MINERALS) TABS tablet, Take 1 tablet by mouth every morning. , Disp: , Rfl:  .  OVER THE COUNTER MEDICATION, Apply 1 application topically daily as needed (pain). Aspercreme with 5% lidocaine, Disp: , Rfl:  .  Oxcarbazepine (TRILEPTAL) 300 MG tablet, Take 1 tablet (300 mg total) by mouth 2 (two) times daily., Disp: 180 tablet, Rfl: 1 .  oxyCODONE-acetaminophen (PERCOCET) 10-325 MG tablet, Take 1 tablet by mouth every 8 (eight) hours as needed for pain., Disp: 270 tablet, Rfl: 0 .  polyethylene glycol (MIRALAX / GLYCOLAX) packet, Take 17 g by mouth daily as needed (for constipation). Mix with liquid and drink, Disp: , Rfl:  .  tiZANidine (ZANAFLEX) 2 MG tablet, Take 1 tablet (2 mg total) by mouth 2 (two) times daily., Disp: 180 tablet, Rfl: 1 .  zinc oxide (BALMEX) 11.3 % CREA cream, Apply 1 application topically at bedtime as needed (IRRITATION). , Disp: , Rfl:   Review of Systems:  Constitutional: Denies fever, chills, diaphoresis, appetite change and fatigue.  HEENT: Denies photophobia, eye pain, redness, hearing loss, ear pain, congestion, sore throat, rhinorrhea, sneezing, mouth sores, trouble swallowing, neck pain, neck stiffness and tinnitus.   Respiratory: Denies SOB, DOE, cough, chest tightness,  and wheezing.   Cardiovascular: Denies chest pain, palpitations and leg swelling.  Gastrointestinal: Denies nausea, vomiting, abdominal pain, diarrhea, constipation, blood in stool and abdominal distention.  Genitourinary: Denies dysuria, urgency, frequency, hematuria, flank pain and difficulty urinating.  Endocrine: Denies: hot or cold intolerance, sweats, changes in hair or nails, polyuria, polydipsia. Musculoskeletal: Denies myalgias, back pain, joint swelling, arthralgias and gait problem.  Skin: Denies pallor, rash and wound.  Neurological: Denies dizziness, seizures, syncope,  light-headedness, and headaches.  Hematological: Denies adenopathy. Easy  bruising, personal or family bleeding history  Psychiatric/Behavioral: Denies suicidal ideation, mood changes, confusion, nervousness, sleep disturbance and agitation    Physical Exam: Vitals:   03/08/20 1436  BP: 120/80  Pulse: (!) 110  Temp: 98.3 F (36.8 C)  TempSrc: Oral  SpO2: 98%  Weight: 190 lb (86.2 kg)  Height: 5\' 2"  (1.575 m)    Body mass index is 34.75 kg/m.   Constitutional: NAD, calm, comfortable Eyes: PERRL, lids and conjunctivae normal, wears corrective lenses ENMT: Mucous membranes are moist.  Respiratory: clear to auscultation bilaterally, no wheezing, no crackles. Normal respiratory effort. No accessory muscle use.  Cardiovascular: Regular rate and rhythm, no murmurs / rubs / gallops. No extremity edema.  Abdomen: no tenderness, no masses palpated. No hepatosplenomegaly. Bowel sounds positive.  Psychiatric: Normal judgment and insight. Alert and oriented x 3. Normal mood.    Impression and Plan:  Paraplegia ([place])  -PDMP reviewed, she has 1 red flag for the dosage of narcotic however she has been on the same dose for years without escalation or signs of misuse., overdose risk  score is 300. -Refill Percocet 10/325 mg to take every 8 hours as needed for pain. 270 tablets, this will last 3 months.  Hyperlipidemia, unspecified hyperlipidemia type  - Plan: Lipid panel -Continue atorvastatin.  Essential hypertension -Well-controlled on current regimen.  Urinary incontinence, unspecified type -Patient is interested in [place] collection device, I think this is appropriate given her medical conditions.   Patient Instructions  -Nice seeing you today!!  -Lab work today; will notify you once results are available.       Lelon Frohlich, MD [facility] at [facility]

## 2020-03-08 NOTE — Patient Instructions (Signed)
-  Nice seeing you today!!  -Lab work today; will notify you once results are available.   

## 2020-03-09 LAB — CBC WITH DIFFERENTIAL/PLATELET
Absolute Monocytes: 722 cells/uL (ref 200–950)
Basophils Absolute: 41 cells/uL (ref 0–200)
Basophils Relative: 0.5 %
Eosinophils Absolute: 131 cells/uL (ref 15–500)
Eosinophils Relative: 1.6 %
HCT: 42.9 % (ref 35.0–45.0)
Hemoglobin: 14.5 g/dL (ref 11.7–15.5)
Lymphs Abs: 2230 cells/uL (ref 850–3900)
MCH: 30.1 pg (ref 27.0–33.0)
MCHC: 33.8 g/dL (ref 32.0–36.0)
MCV: 89.2 fL (ref 80.0–100.0)
MPV: 9.7 fL (ref 7.5–12.5)
Monocytes Relative: 8.8 %
Neutro Abs: 5076 cells/uL (ref 1500–7800)
Neutrophils Relative %: 61.9 %
Platelets: 234 10*3/uL (ref 140–400)
RBC: 4.81 10*6/uL (ref 3.80–5.10)
RDW: 12.4 % (ref 11.0–15.0)
Total Lymphocyte: 27.2 %
WBC: 8.2 10*3/uL (ref 3.8–10.8)

## 2020-03-09 LAB — HEMOGLOBIN A1C
Hgb A1c MFr Bld: 5.2 % of total Hgb (ref <5.7)
Mean Plasma Glucose: 103 (calc)
eAG (mmol/L): 5.7 (calc)

## 2020-03-09 LAB — COMPREHENSIVE METABOLIC PANEL
AG Ratio: 1.9 (calc) (ref 1.0–2.5)
ALT: 17 U/L (ref 6–29)
AST: 17 U/L (ref 10–35)
Albumin: 4.6 g/dL (ref 3.6–5.1)
Alkaline phosphatase (APISO): 53 U/L (ref 37–153)
BUN: 16 mg/dL (ref 7–25)
CO2: 27 mmol/L (ref 20–32)
Calcium: 9.5 mg/dL (ref 8.6–10.4)
Chloride: 97 mmol/L — ABNORMAL LOW (ref 98–110)
Creat: 0.56 mg/dL (ref 0.50–0.99)
Globulin: 2.4 g/dL (calc) (ref 1.9–3.7)
Glucose, Bld: 85 mg/dL (ref 65–99)
Potassium: 4.4 mmol/L (ref 3.5–5.3)
Sodium: 134 mmol/L — ABNORMAL LOW (ref 135–146)
Total Bilirubin: 0.4 mg/dL (ref 0.2–1.2)
Total Protein: 7 g/dL (ref 6.1–8.1)

## 2020-03-09 LAB — TSH: TSH: 0.63 mIU/L (ref 0.40–4.50)

## 2020-03-09 LAB — LIPID PANEL
Cholesterol: 186 mg/dL (ref <200)
HDL: 67 mg/dL (ref >=50)
LDL Cholesterol (Calc): 94 mg/dL (calc)
Non-HDL Cholesterol (Calc): 119 mg/dL (calc) (ref <130)
Total CHOL/HDL Ratio: 2.8 (calc) (ref <5.0)
Triglycerides: 148 mg/dL (ref <150)

## 2020-03-09 LAB — VITAMIN B12: Vitamin B-12: 453 pg/mL (ref 200–1100)

## 2020-03-09 LAB — VITAMIN D 25 HYDROXY (VIT D DEFICIENCY, FRACTURES): Vit D, 25-Hydroxy: 36 ng/mL (ref 30–100)

## 2020-03-11 ENCOUNTER — Telehealth: Payer: Self-pay | Admitting: Internal Medicine

## 2020-03-11 ENCOUNTER — Encounter: Payer: Self-pay | Admitting: Internal Medicine

## 2020-03-11 DIAGNOSIS — R32 Unspecified urinary incontinence: Secondary | ICD-10-CM | POA: Diagnosis not present

## 2020-03-11 DIAGNOSIS — G8220 Paraplegia, unspecified: Secondary | ICD-10-CM | POA: Diagnosis not present

## 2020-03-11 DIAGNOSIS — G839 Paralytic syndrome, unspecified: Secondary | ICD-10-CM

## 2020-03-11 NOTE — Telephone Encounter (Signed)
Call from [facility] want a call from you about a prescription  For pt .

## 2020-03-12 ENCOUNTER — Encounter: Payer: Self-pay | Admitting: Internal Medicine

## 2020-03-12 MED ORDER — BACLOFEN 20 MG PO TABS: ORAL_TABLET | ORAL | 1 refills | Status: DC

## 2020-03-12 NOTE — Telephone Encounter (Signed)
Replied to patient via MyChart. 

## 2020-03-13 ENCOUNTER — Other Ambulatory Visit: Payer: PPO

## 2020-03-13 ENCOUNTER — Inpatient Hospital Stay: Payer: PPO | Attending: Hematology and Oncology

## 2020-03-13 ENCOUNTER — Other Ambulatory Visit: Payer: Self-pay

## 2020-03-13 ENCOUNTER — Ambulatory Visit: Payer: PPO

## 2020-03-13 VITALS — BP 176/76 | HR 64 | Temp 97.6°F | Resp 18

## 2020-03-13 DIAGNOSIS — C50412 Malignant neoplasm of upper-outer quadrant of left female breast: Secondary | ICD-10-CM | POA: Insufficient documentation

## 2020-03-13 DIAGNOSIS — M810 Age-related osteoporosis without current pathological fracture: Secondary | ICD-10-CM | POA: Diagnosis not present

## 2020-03-13 DIAGNOSIS — M8000XA Age-related osteoporosis with current pathological fracture, unspecified site, initial encounter for fracture: Secondary | ICD-10-CM

## 2020-03-13 MED ORDER — DENOSUMAB 60 MG/ML ~~LOC~~ SOSY
60.0000 mg | PREFILLED_SYRINGE | Freq: Once | SUBCUTANEOUS | Status: AC
  Administered 2020-03-13: 60 mg via SUBCUTANEOUS

## 2020-03-13 NOTE — Patient Instructions (Signed)
Denosumab injection °What is this medicine? °DENOSUMAB (den oh sue mab) slows bone breakdown. Prolia is used to treat osteoporosis in women after menopause and in men, and in people who are taking corticosteroids for 6 months or more. Xgeva is used to treat a high calcium level due to cancer and to prevent bone fractures and other bone problems caused by multiple myeloma or cancer bone metastases. Xgeva is also used to treat giant cell tumor of the bone. °This medicine may be used for other purposes; ask your health care provider or pharmacist if you have questions. °COMMON BRAND NAME(S): Prolia, XGEVA °What should I tell my health care provider before I take this medicine? °They need to know if you have any of these conditions: °· dental disease °· having surgery or tooth extraction °· infection °· kidney disease °· low levels of calcium or Vitamin D in the blood °· malnutrition °· on hemodialysis °· skin conditions or sensitivity °· thyroid or parathyroid disease °· an unusual reaction to denosumab, other medicines, foods, dyes, or preservatives °· pregnant or trying to get pregnant °· breast-feeding °How should I use this medicine? °This medicine is for injection under the skin. It is given by a health care professional in a hospital or clinic setting. °A special MedGuide will be given to you before each treatment. Be sure to read this information carefully each time. °For Prolia, talk to your pediatrician regarding the use of this medicine in children. Special care may be needed. For Xgeva, talk to your pediatrician regarding the use of this medicine in children. While this drug may be prescribed for children as young as 13 years for selected conditions, precautions do apply. °Overdosage: If you think you have taken too much of this medicine contact a poison control center or emergency room at once. °NOTE: This medicine is only for you. Do not share this medicine with others. °What if I miss a dose? °It is  important not to miss your dose. Call your doctor or health care professional if you are unable to keep an appointment. °What may interact with this medicine? °Do not take this medicine with any of the following medications: °· other medicines containing denosumab °This medicine may also interact with the following medications: °· medicines that lower your chance of fighting infection °· steroid medicines like prednisone or cortisone °This list may not describe all possible interactions. Give your health care provider a list of all the medicines, herbs, non-prescription drugs, or dietary supplements you use. Also tell them if you smoke, drink alcohol, or use illegal drugs. Some items may interact with your medicine. °What should I watch for while using this medicine? °Visit your doctor or health care professional for regular checks on your progress. Your doctor or health care professional may order blood tests and other tests to see how you are doing. °Call your doctor or health care professional for advice if you get a fever, chills or sore throat, or other symptoms of a cold or flu. Do not treat yourself. This drug may decrease your body's ability to fight infection. Try to avoid being around people who are sick. °You should make sure you get enough calcium and vitamin D while you are taking this medicine, unless your doctor tells you not to. Discuss the foods you eat and the vitamins you take with your health care professional. °See your dentist regularly. Brush and floss your teeth as directed. Before you have any dental work done, tell your dentist you are   receiving this medicine. Do not become pregnant while taking this medicine or for 5 months after stopping it. Talk with your doctor or health care professional about your birth control options while taking this medicine. Women should inform their doctor if they wish to become pregnant or think they might be pregnant. There is a potential for serious side  effects to an unborn child. Talk to your health care professional or pharmacist for more information. What side effects may I notice from receiving this medicine? Side effects that you should report to your doctor or health care professional as soon as possible:  allergic reactions like skin rash, itching or hives, swelling of the face, lips, or tongue  bone pain  breathing problems  dizziness  jaw pain, especially after dental work  redness, blistering, peeling of the skin  signs and symptoms of infection like fever or chills; cough; sore throat; pain or trouble passing urine  signs of low calcium like fast heartbeat, muscle cramps or muscle pain; pain, tingling, numbness in the hands or feet; seizures  unusual bleeding or bruising  unusually weak or tired Side effects that usually do not require medical attention (report to your doctor or health care professional if they continue or are bothersome):  constipation  diarrhea  headache  joint pain  loss of appetite  muscle pain  runny nose  tiredness  upset stomach This list may not describe all possible side effects. Call your doctor for medical advice about side effects. You may report side effects to FDA at 1-800-FDA-1088. Where should I keep my medicine? This medicine is only given in a clinic, doctor's office, or other health care setting and will not be stored at home. NOTE: This sheet is a summary. It may not cover all possible information. If you have questions about this medicine, talk to your doctor, pharmacist, or health care provider.  2020 Elsevier/Gold Standard (2017-12-10 16:10:44)

## 2020-03-20 ENCOUNTER — Other Ambulatory Visit: Payer: Self-pay

## 2020-03-20 ENCOUNTER — Ambulatory Visit: Payer: PPO

## 2020-03-20 DIAGNOSIS — M8000XA Age-related osteoporosis with current pathological fracture, unspecified site, initial encounter for fracture: Secondary | ICD-10-CM

## 2020-03-20 DIAGNOSIS — Z8673 Personal history of transient ischemic attack (TIA), and cerebral infarction without residual deficits: Secondary | ICD-10-CM

## 2020-03-20 DIAGNOSIS — E785 Hyperlipidemia, unspecified: Secondary | ICD-10-CM

## 2020-03-20 DIAGNOSIS — I10 Essential (primary) hypertension: Secondary | ICD-10-CM

## 2020-03-20 NOTE — Chronic Care Management (AMB) (Signed)
Chronic Care Management Pharmacy  Name: Stacy Diaz  MRN: [id number] DOB: [birth year]  Initial Questions: 1. Have you seen any other providers since your last visit? Yes  2. Any changes in your medicines or health? No   Chief Complaint/ HPI  Stacy Diaz,  69 y.o. , female presents for their Follow-Up CCM visit with the clinical pharmacist via telephone due to COVID-19 pandemic.   03/20/2020 Spouse stated patient was doing well overall and had no complaints. Spouse reports patient receives care from different organizations (home nurse, etc). Requested not to be contacted again for CCM services.   09/28/2019 Spouse gave history of patient. 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 She has been in a wheelchair in 2007. In 2009, she had a fall and has not improved since then. 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 She has been with therapy, but still very weak from left side. Patient has orthotics and uses pivot desk to turn. Per spouse, patient has intermittent spasticity  in her legs.    PCP : Isaac Bliss, Rayford Halsted, MD  Their chronic conditions include: HTN, Osteoporosis, Hyperlipidemia, Breast cancer, Constipation, Seizures, Chronic pain syndrome/ paraplegia  Office Visits: 03/08/2020- Lelon Frohlich, MD- Patient presented for office visit for new power wheelchair. Percocet 10/325mg  every 8 hours as needed for pain refilled for 3 months. Patient to obtain blood work (lipid panel).   12/20/2019- Lelon Frohlich, MD- Patient presented for virtual visit for opioid refills per narcotic contract. Oxycodone 10/325 refilled for 90 day supply.   09/15/2019- Patient presented to Dr. Lelon Frohlich, MD via virtual visit for osteoporosis. T score increased to -4. Oncologist took patient off letrozole and continued Prolia injections. Oxycodone 10/325mg  refilled for 90 days.     Consult  Visit: 09/13/2019- Oncology- Patient presented to Dr. Nicholas Lose, MD in office for malignant neoplasm on left breast. Patient switched from anastrozole to letrozole, but eventually letrozole discontinued due to worsening osteoporosis. T score -4. Patient to continue Prolia every 6 months and return for follow up in 1 year.   Medications: Outpatient Encounter Medications as of 03/20/2020  Medication Sig  . atenolol (TENORMIN) 50 MG tablet TAKE 1 & 1/2 TABLET BY MOUTH TWICE DAILY  . atorvastatin (LIPITOR) 10 MG tablet Take 1 tablet (10 mg total) by mouth daily at 6 PM.  . baclofen (LIORESAL) 20 MG tablet TAKE 1 TABLET BY MOUTH 4 TIMES A DAY  . Calcium Carb-Cholecalciferol (CALCIUM CARBONATE-VITAMIN D3 PO) Take by mouth. 600mg / 2500 IU, patient takes 2 tablets daily  . clopidogrel (PLAVIX) 75 MG tablet TAKE 1 TABLET BY MOUTH EVERY DAY  . diazepam (VALIUM) 5 MG tablet TAKE 1 TABLET BY MOUTH EVERYDAY AT BEDTIME  . lisinopril (ZESTRIL) 10 MG tablet TAKE 1 TABLET BY MOUTH EVERYDAY AT BEDTIME  . lisinopril (ZESTRIL) 20 MG tablet Take 1 tablet (20 mg total) by mouth 2 (two) times daily.  . Multiple Vitamin (MULTIVITAMIN WITH MINERALS) TABS tablet Take 1 tablet by mouth every morning.   Marland Kitchen OVER THE COUNTER MEDICATION Apply 1 application topically daily as needed (pain). Aspercreme with 5% lidocaine  . Oxcarbazepine (TRILEPTAL) 300 MG tablet Take 1 tablet (300 mg total) by mouth 2 (two) times daily.  Marland Kitchen oxyCODONE-acetaminophen (PERCOCET) 10-325 MG tablet Take 1 tablet by mouth every 8 (eight) hours as needed for pain.  . polyethylene glycol (MIRALAX / GLYCOLAX) packet Take 17 g by mouth daily as  needed (for constipation). Mix with liquid and drink  . tiZANidine (ZANAFLEX) 2 MG tablet Take 1 tablet (2 mg total) by mouth 2 (two) times daily.  Marland Kitchen zinc oxide (BALMEX) 11.3 % CREA cream Apply 1 application topically at bedtime as needed (IRRITATION).    No facility-administered encounter medications on file as of  03/20/2020.     Current Diagnosis/Assessment:  Goals Addressed            This Visit's Progress   . Pharmacy Care Plan       CARE PLAN ENTRY (see longitudinal plan of care for additional care plan information)  Current Barriers:  . Chronic Disease Management support, education, and care coordination needs related to Hypertension, Hyperlipidemia, Osteoporosis, and History of stroke   Hypertension BP Readings from Last 3 Encounters:  03/13/20 (!) 176/76  03/08/20 120/80  09/13/19 (!) 152/72   . Pharmacist Clinical Goal(s): o Over the next 180days, patient will work with PharmD and providers to maintain BP goal <140/90 . Current regimen:   atenolol 50mg , 1.5 tablets twice daily  lisinopril 20mg , 1 tablet twice daily  lisinopril 10mg , 1 tablet at bedtime . Patient self care activities - Over the next 180 days, patient will: o Check BP daily document, and provide at future appointments o Ensure daily salt intake < 2300 mg/day  Hyperlipidemia Lab Results  Component Value Date/Time   LDLCALC 94 03/08/2020 03:24 PM   LDLDIRECT 142.6 03/26/2011 08:32 AM   . Pharmacist Clinical Goal(s): o Over the next 180 days, patient will work with PharmD and providers to maintain LDL goal < 70 . Current regimen:  o atorvastatin 10mg , 1 tablet once daily,  . Patient self care activities - Over the next 180 days, patient will: o Continue current medications as directed by provider.   Osteoporosis . Pharmacist Clinical Goal(s) o Over the next 180 days, patient will work with PharmD and providers to prevent bone fractures.  . Current regimen:  o Prolia injections every 6 months. (last Jan. 27th, 2021; 03/13/2020) o Calcium carbonate/ cholecalciferol 600/2500 IU, 2 tablets daily   . Patient self care activities - Over the next 180 days, patient will: o Continue current medications as directed by provider.   History of stroke . Pharmacist Clinical Goal(s) o Over the next 180 days,  patient will work with PharmD and providers to prevent reoccurrence of stroke.  . Current regimen:  o clopidogrel 75mg , 1 tablet daily . Interventions: o Reviewed adherence of clopidogrel.  . Patient self care activities o Patient will continue current medications as directed by providers.   Medication management . Pharmacist Clinical Goal(s): o Over the next 180 days, patient will work with PharmD and providers to achieve optimal medication adherence . Current pharmacy: [facility] order . Interventions o Comprehensive medication review performed. o Continue current medication management strategy . Patient self care activities - Over the next 180 days, patient will: o Take medications as prescribed o Report any questions or concerns to PharmD and/or provider(s)  Please see past updates related to this goal by clicking on the "Past Updates" button in the selected goal         SDOH Interventions     Most Recent Value  SDOH Interventions  Financial Strain Interventions Intervention Not Indicated  [Spouse reports medications are more cost effective with [facility] mail order]      Hypertension  BP today is:  <130/80  Office blood pressures are  BP Readings from Last 3 Encounters:  03/13/20 Marland Kitchen)  176/76  03/08/20 120/80  09/13/19 (!) 152/72    Patient has failed these meds in the past: quinapril  Patient checks BP at home daily  Patient home BP readings are ranging: per spouse states overall well controlled and at office BP tends to be higher.  Previous reported BP readings: 125-130s/70-80 mmHg.   Patient is controlled on:  atenolol 50mg , 1.5 tablets twice daily  lisinopril 20mg , 1 tablet twice daily  lisinopril 10mg , 1 tablet at bedtime  Plan Continue current medications   Hyperlipidemia   LDL goal: <70  Lipid Panel     Component Value Date/Time   CHOL 186 03/08/2020 1524   TRIG 148 03/08/2020 1524   HDL 67 03/08/2020 1524   CHOLHDL 2.8 03/08/2020 1524    VLDL 20 06/03/2016 0408   LDLCALC 94 03/08/2020 1524   LDLDIRECT 142.6 03/26/2011 0832    The 10-year ASCVD risk score Mikey Bussing DC Jr., et al., 2013) is: 17.1%   Values used to calculate the score:     Age: 70 years     Sex: Female     Is Non-Hispanic African American: No     Diabetic: No     Tobacco smoker: No     Systolic Blood Pressure: 628 mmHg     Is BP treated: Yes     HDL Cholesterol: 67 mg/dL     Total Cholesterol: 186 mg/dL   Patient has failed these meds in past: simvastatin Patient is currently controlled on the following medications:   atorvastatin 10mg , 1 tablet once daily  Plan Continue current medications  Consider dose increase to target LDL < 70 (due to history of stroke)   History of stroke   Patient is currently on the following medications:   clopidogrel 75mg , 1 tablet daily   Plan Continue current medications   Breast Cancer of left breast  Patient has failed these meds in past: Anastrozole, Letrozole (from Nov 2016 to 09/13/19, stopped due to worsening osteoporosis)  Patient is currently managed on the following medications:none  Plan Managed by oncology.   Osteopenia / Osteoporosis   Last DEXA Scan: 08/17/2019  T-Score femoral neck: right: -3.30 left: -3.50  T-Score total hip: (left) -4.0  T-Score AP total spine: -0.80  T-Score total femur: right: -3.30; left: -4.0  Vit D, 25-Hydroxy  Date Value Ref Range Status  03/08/2020 36 30 - 100 ng/mL Final    Comment:    Vitamin D Status         25-OH Vitamin D: . Deficiency:                    <20 ng/mL Insufficiency:             20 - 29 ng/mL Optimal:                 > or = 30 ng/mL . For 25-OH Vitamin D testing on patients on  D2-supplementation and patients for whom quantitation  of D2 and D3 fractions is required, the QuestAssureD(TM) 25-OH VIT D, (D2,D3), LC/MS/MS is recommended: order  code [phone] (patients >72yrs). See Note 1 . Note 1 . For additional information, please refer to   http://education.Quest[facility].com/faq/FAQ199  (This link is being provided for informational/ educational purposes only.)      Patient is a candidate for pharmacologic treatment due to T-Score < -2.5 in femoral neck and T-Score < -2.5 in total hip   Patient is currently managed on the following medications:  . Prolia every  6 months. (last Jan. 27th, 2021; 03/13/2020) . Calcium carbonate/ cholecalciferol 600/2500 IU, 2 tablets daily    Plan Continue current medications   Chronic pain syndrome/paraplegia    Patient has failed these meds in past: Robaxin  Patient is currently controlled on the following medications:   Oxycodone/ APAP 10/325mg , 1 tablet every 8 hours as needed for pain,   Baclofen 20mg , 1 tablet four times daily,   Tizanidine 2mg , 1 tablet twice daily,  Diazepam 5mg , 1 tablet at bedtime.   Plan Continue current medications.    Constipation   Patient is currently controlled on the following medications:   senna/ docusate  Miralax.   Plan Continue current medications.   Seizures  Patient has failed these meds in past: none Patient is currently managed on the following medications:   Oxcarbazepine 300mg , 1 tablet twice daily.   Plan Managed by neurologist. Continue current medications.     Medication Management  Patient organizes medications:  Spouse organizes medications in weekly pill box. Most medications are now being filled with mail order, except oxycodone/ APAP with [facility] Primary pharmacy: [facility]/ [facility] Adherence:   - clopidogrel 75mg  (last filled 10/30/19 for 90DS)- per spouse, patient still taking   Cost comparison conducted at last visit: Between [facility] and [facility] per spouse request, for oxycodone/ APAP for 3 months supply. #[phone]. At [facility], [id number]. [facility]: [facility]. [facility] at least $110.     Follow up No follow up scheduled/ requested.   Anson Crofts, PharmD, BCACP  Clinical Pharmacist [facility]  at [place] [phone]

## 2020-03-20 NOTE — Patient Instructions (Signed)
Visit Information  Goals Addressed            This Visit's Progress   . Pharmacy Care Plan       CARE PLAN ENTRY (see longitudinal plan of care for additional care plan information)  Current Barriers:  . Chronic Disease Management support, education, and care coordination needs related to Hypertension, Hyperlipidemia, Osteoporosis, and History of stroke   Hypertension BP Readings from Last 3 Encounters:  03/13/20 (!) 176/76  03/08/20 120/80  09/13/19 (!) 152/72   . Pharmacist Clinical Goal(s): o Over the next 180days, patient will work with PharmD and providers to maintain BP goal <140/90 . Current regimen:   atenolol 50mg , 1.5 tablets twice daily  lisinopril 20mg , 1 tablet twice daily  lisinopril 10mg , 1 tablet at bedtime . Patient self care activities - Over the next 180 days, patient will: o Check BP daily document, and provide at future appointments o Ensure daily salt intake < 2300 mg/day  Hyperlipidemia Lab Results  Component Value Date/Time   LDLCALC 94 03/08/2020 03:24 PM   LDLDIRECT 142.6 03/26/2011 08:32 AM   . Pharmacist Clinical Goal(s): o Over the next 180 days, patient will work with PharmD and providers to maintain LDL goal < 70 . Current regimen:  o atorvastatin 10mg , 1 tablet once daily,  . Patient self care activities - Over the next 180 days, patient will: o Continue current medications as directed by provider.   Osteoporosis . Pharmacist Clinical Goal(s) o Over the next 180 days, patient will work with PharmD and providers to prevent bone fractures.  . Current regimen:  o Prolia injections every 6 months. (last Jan. 27th, 2021; 03/13/2020) o Calcium carbonate/ cholecalciferol 600/2500 IU, 2 tablets daily   . Patient self care activities - Over the next 180 days, patient will: o Continue current medications as directed by provider.   History of stroke . Pharmacist Clinical Goal(s) o Over the next 180 days, patient will work with PharmD and  providers to prevent reoccurrence of stroke.  . Current regimen:  o clopidogrel 75mg , 1 tablet daily . Interventions: o Reviewed adherence of clopidogrel.  . Patient self care activities o Patient will continue current medications as directed by providers.   Medication management . Pharmacist Clinical Goal(s): o Over the next 180 days, patient will work with PharmD and providers to achieve optimal medication adherence . Current pharmacy: [facility] order . Interventions o Comprehensive medication review performed. o Continue current medication management strategy . Patient self care activities - Over the next 180 days, patient will: o Take medications as prescribed o Report any questions or concerns to PharmD and/or provider(s)  Please see past updates related to this goal by clicking on the "Past Updates" button in the selected goal          Patient verbalizes understanding of instructions provided today.   CCM enrollment status changed to "previously enrolled" as per patient request on 03/20/2020 to discontinue enrollment. Case closed to case management services in primary care home.   Anson Crofts, PharmD, BCACP Clinical Pharmacist [facility] at [place] [phone]

## 2020-04-04 DIAGNOSIS — D332 Benign neoplasm of brain, unspecified: Secondary | ICD-10-CM | POA: Diagnosis not present

## 2020-04-04 DIAGNOSIS — I6350 Cerebral infarction due to unspecified occlusion or stenosis of unspecified cerebral artery: Secondary | ICD-10-CM | POA: Diagnosis not present

## 2020-04-04 DIAGNOSIS — G8320 Monoplegia of upper limb affecting unspecified side: Secondary | ICD-10-CM | POA: Diagnosis not present

## 2020-04-12 ENCOUNTER — Encounter: Payer: Self-pay | Admitting: Internal Medicine

## 2020-04-12 MED ORDER — ATORVASTATIN CALCIUM 10 MG PO TABS: 10.0000 mg | ORAL_TABLET | Freq: Every day | ORAL | 1 refills | Status: DC

## 2020-04-16 ENCOUNTER — Other Ambulatory Visit: Payer: Self-pay | Admitting: *Deleted

## 2020-04-16 MED ORDER — ATENOLOL 50 MG PO TABS: ORAL_TABLET | ORAL | 2 refills | Status: DC

## 2020-04-29 ENCOUNTER — Telehealth: Payer: Self-pay | Admitting: Internal Medicine

## 2020-04-29 NOTE — Telephone Encounter (Signed)
Raquel Sarna from [facility] stated they received the referral for a power wheel chair but needs the OV notes.   Raquel Sarna can be reached at [phone]  Fax: [phone]

## 2020-04-30 NOTE — Telephone Encounter (Signed)
Notes faxed and confirmed

## 2020-05-09 ENCOUNTER — Encounter: Payer: Self-pay | Admitting: Internal Medicine

## 2020-05-16 ENCOUNTER — Encounter: Payer: Self-pay | Admitting: Internal Medicine

## 2020-05-16 ENCOUNTER — Other Ambulatory Visit: Payer: Self-pay

## 2020-05-16 ENCOUNTER — Ambulatory Visit (INDEPENDENT_AMBULATORY_CARE_PROVIDER_SITE_OTHER): Payer: PPO | Admitting: *Deleted

## 2020-05-16 DIAGNOSIS — Z23 Encounter for immunization: Secondary | ICD-10-CM | POA: Diagnosis not present

## 2020-05-20 ENCOUNTER — Other Ambulatory Visit: Payer: Self-pay | Admitting: *Deleted

## 2020-05-20 DIAGNOSIS — R569 Unspecified convulsions: Secondary | ICD-10-CM

## 2020-05-20 MED ORDER — OXCARBAZEPINE 300 MG PO TABS: 300.0000 mg | ORAL_TABLET | Freq: Two times a day (BID) | ORAL | 1 refills | Status: DC

## 2020-05-21 ENCOUNTER — Other Ambulatory Visit: Payer: Self-pay | Admitting: *Deleted

## 2020-05-21 MED ORDER — LISINOPRIL 20 MG PO TABS: 20.0000 mg | ORAL_TABLET | Freq: Two times a day (BID) | ORAL | 1 refills | Status: DC

## 2020-05-21 NOTE — Telephone Encounter (Signed)
Refill sent and medication list updated.

## 2020-05-22 ENCOUNTER — Encounter: Payer: Self-pay | Admitting: Internal Medicine

## 2020-05-22 DIAGNOSIS — G8220 Paraplegia, unspecified: Secondary | ICD-10-CM

## 2020-05-22 MED ORDER — DIAZEPAM 5 MG PO TABS: ORAL_TABLET | ORAL | 1 refills | Status: DC

## 2020-05-22 MED ORDER — DIAZEPAM 5 MG PO TABS: ORAL_TABLET | ORAL | 0 refills | Status: DC

## 2020-06-03 ENCOUNTER — Telehealth: Payer: Self-pay | Admitting: *Deleted

## 2020-06-03 NOTE — Telephone Encounter (Signed)
Received call from pt requesting advice on receiving a bone density scan this year.  Last scan was 07/2019 and showed a T score of -4.  At that time Dr. Lindi Adie started pt on Prolia injections and stopped letrozole.  Per MD pt will not qualify for bone density scan this year.  Will continue to monitor pt and obtain bone scan 07/2021.  Pt verbalized understanding.

## 2020-06-04 ENCOUNTER — Encounter: Payer: Self-pay | Admitting: Internal Medicine

## 2020-06-07 ENCOUNTER — Telehealth (INDEPENDENT_AMBULATORY_CARE_PROVIDER_SITE_OTHER): Payer: PPO | Admitting: Internal Medicine

## 2020-06-07 DIAGNOSIS — G8220 Paraplegia, unspecified: Secondary | ICD-10-CM

## 2020-06-07 MED ORDER — OXYCODONE-ACETAMINOPHEN 10-325 MG PO TABS: 1.0000 | ORAL_TABLET | Freq: Three times a day (TID) | ORAL | 0 refills | Status: DC | PRN

## 2020-06-07 NOTE — Progress Notes (Signed)
Virtual Visit via Video Note  I connected with Stacy Diaz on 06/07/20 at  4:00 PM EDT by a video enabled telemedicine application and verified that I am speaking with the correct person using two identifiers.  Location patient: home Location provider: work office Persons participating in the virtual visit: patient, provider  I discussed the limitations of evaluation and management by telemedicine and the availability of in person appointments. The patient expressed understanding and agreed to proceed.   HPI: This is a routine visit for narcotic refills per opioid contract.  She is on oxycodone 10/325 mg.  She is prescribed 270 tablets which will last for 90 days.  She has been on this medication for years without dose escalation.  She states she is tolerating medication well, it affords good pain relief.  She is getting a new power wheelchair that should be coming in next week.   ROS: Constitutional: Denies fever, chills, diaphoresis, appetite change and fatigue.  HEENT: Denies photophobia, eye pain, redness, hearing loss, ear pain, congestion, sore throat, rhinorrhea, sneezing, mouth sores, trouble swallowing, neck pain, neck stiffness and tinnitus.   Respiratory: Denies SOB, DOE, cough, chest tightness,  and wheezing.   Cardiovascular: Denies chest pain, palpitations and leg swelling.  Gastrointestinal: Denies nausea, vomiting, abdominal pain, diarrhea, constipation, blood in stool and abdominal distention.  Genitourinary: Denies dysuria, urgency, frequency, hematuria, flank pain and difficulty urinating.  Endocrine: Denies: hot or cold intolerance, sweats, changes in hair or nails, polyuria, polydipsia. Musculoskeletal: Denies myalgias, back pain, joint swelling, arthralgias and gait problem.  Skin: Denies pallor, rash and wound.  Neurological: Denies dizziness, seizures, syncope, weakness, light-headedness, numbness and headaches.  Hematological: Denies adenopathy. Easy  bruising, personal or family bleeding history  Psychiatric/Behavioral: Denies suicidal ideation, mood changes, confusion, nervousness, sleep disturbance and agitation   Past Medical History:  Diagnosis Date  . Anxiety   . Arthritis    "probably" (08/25/2012)  . Breast cancer of lower-outer quadrant of left female breast ([place]) 05/25/2014  . Headache(784.0)    "not real frequent" (08/25/2012)  . Heart disease   . HTN (hypertension)   . Hyperlipidemia   . Meningioma ([place]) 1999  . Myocardial infarction ([place]) 1987  . Paresis of lower extremity ([place]) 1999   BLE/notes 08/25/2012  . S/P radiation therapy 07/30/14-08/30/13   left breast cancer/50Gy  . Seizures ([place])    no seizures since 2014  . Spastic paraparesis    S/P meningioma resection 1999/notes 08/25/2012  . Stroke ([place])    x 2, the last 2014    Past Surgical History:  Procedure Laterality Date  . BRAIN MENINGIOMA EXCISION  05/1997  . BREAST LUMPECTOMY WITH NEEDLE LOCALIZATION AND AXILLARY SENTINEL LYMPH NODE BX Left 06/22/2014   Procedure: LEFT WIRE LOCALIZED LUMPECTOPMY AND SENTINEL NODE MAPPING;  Surgeon: Autumn Messing III, MD;  Location: [facility];  Service: General;  Laterality: Left;  . CARDIAC CATHETERIZATION  1987  . CORONARY ANGIOPLASTY  1987   denied intervention: "blockage wasn't bad enough"  . TIBIA IM NAIL INSERTION Right 04/26/2018   Procedure: INTRAMEDULLARY (IM) NAIL TIBIAL;  Surgeon: Altamese Germantown, MD;  Location: [facility];  Service: Orthopedics;  Laterality: Right;    Family History  Problem Relation Age of Onset  . Breast cancer Mother 28       unilateral  . Heart disease Father   . Breast cancer Paternal Grandmother 88       unilateral    SOCIAL HX:   reports that she  has quit smoking. Her smoking use included cigarettes. She has a 20.00 pack-year smoking history. She has never used smokeless tobacco. She reports that she does not drink alcohol and does not use drugs.   Current Outpatient Medications:  .  atenolol  (TENORMIN) 50 MG tablet, TAKE 1 & 1/2 TABLET BY MOUTH TWICE DAILY, Disp: 270 tablet, Rfl: 2 .  atorvastatin (LIPITOR) 10 MG tablet, Take 1 tablet (10 mg total) by mouth daily at 6 PM., Disp: 90 tablet, Rfl: 1 .  baclofen (LIORESAL) 20 MG tablet, TAKE 1 TABLET BY MOUTH 4 TIMES A DAY, Disp: 360 tablet, Rfl: 1 .  Calcium Carb-Cholecalciferol (CALCIUM CARBONATE-VITAMIN D3 PO), Take by mouth. 600mg / 2500 IU, patient takes 2 tablets daily, Disp: , Rfl:  .  clopidogrel (PLAVIX) 75 MG tablet, TAKE 1 TABLET BY MOUTH EVERY DAY, Disp: 90 tablet, Rfl: 1 .  diazepam (VALIUM) 5 MG tablet, TAKE 1 TABLET BY MOUTH EVERYDAY AT BEDTIME, Disp: 30 tablet, Rfl: 0 .  lisinopril (ZESTRIL) 20 MG tablet, Take 1 tablet (20 mg total) by mouth 2 (two) times daily., Disp: 180 tablet, Rfl: 1 .  Multiple Vitamin (MULTIVITAMIN WITH MINERALS) TABS tablet, Take 1 tablet by mouth every morning. , Disp: , Rfl:  .  OVER THE COUNTER MEDICATION, Apply 1 application topically daily as needed (pain). Aspercreme with 5% lidocaine, Disp: , Rfl:  .  Oxcarbazepine (TRILEPTAL) 300 MG tablet, Take 1 tablet (300 mg total) by mouth 2 (two) times daily., Disp: 180 tablet, Rfl: 1 .  oxyCODONE-acetaminophen (PERCOCET) 10-325 MG tablet, Take 1 tablet by mouth every 8 (eight) hours as needed for pain., Disp: 270 tablet, Rfl: 0 .  polyethylene glycol (MIRALAX / GLYCOLAX) packet, Take 17 g by mouth daily as needed (for constipation). Mix with liquid and drink, Disp: , Rfl:  .  tiZANidine (ZANAFLEX) 2 MG tablet, Take 1 tablet (2 mg total) by mouth 2 (two) times daily., Disp: 180 tablet, Rfl: 1 .  zinc oxide (BALMEX) 11.3 % CREA cream, Apply 1 application topically at bedtime as needed (IRRITATION). , Disp: , Rfl:   EXAM:   VITALS per patient if applicable: None reported  GENERAL: alert, oriented, appears well and in no acute distress  HEENT: atraumatic, conjunttiva clear, no obvious abnormalities on inspection of external nose and ears, wears  corrective lenses  NECK: normal movements of the head and neck  LUNGS: on inspection no signs of respiratory distress, breathing rate appears normal, no obvious gross increased work of breathing, gasping or wheezing  CV: no obvious cyanosis  MS: moves all visible extremities without noticeable abnormality  PSYCH/NEURO: pleasant and cooperative, no obvious depression or anxiety, speech and thought processing grossly intact  ASSESSMENT AND PLAN:   Paraplegia ([place]) -PDMP reviewed, overdose risk score is 290, she has 1 red flag for morphine equivalent dosing, but as stated as above she has been on same prescription for years without dose escalation. -I will refill her Percocet 10/325 mg to take every 8 hours as needed for pain.  She gets 270 tablets for 90 days.    I discussed the assessment and treatment plan with the patient. The patient was provided an opportunity to ask questions and all were answered. The patient agreed with the plan and demonstrated an understanding of the instructions.   The patient was advised to call back or seek an in-person evaluation if the symptoms worsen or if the condition fails to improve as anticipated.    Lelon Frohlich, MD  [facility] at [facility]

## 2020-06-15 ENCOUNTER — Encounter: Payer: Self-pay | Admitting: Internal Medicine

## 2020-06-17 MED ORDER — LISINOPRIL 20 MG PO TABS: 20.0000 mg | ORAL_TABLET | Freq: Two times a day (BID) | ORAL | 1 refills | Status: DC

## 2020-06-19 ENCOUNTER — Other Ambulatory Visit: Payer: Self-pay | Admitting: *Deleted

## 2020-06-19 MED ORDER — LISINOPRIL 10 MG PO TABS: 10.0000 mg | ORAL_TABLET | Freq: Every day | ORAL | 3 refills | Status: DC

## 2020-06-19 MED ORDER — ATORVASTATIN CALCIUM 10 MG PO TABS
10.0000 mg | ORAL_TABLET | Freq: Every day | ORAL | 1 refills | Status: DC
Start: 2020-06-19 — End: 2020-10-09

## 2020-06-19 NOTE — Addendum Note (Signed)
Addended by: Westley Hummer B on: 06/19/2020 09:19 AM   Modules accepted: Orders

## 2020-06-20 ENCOUNTER — Encounter: Payer: Self-pay | Admitting: Internal Medicine

## 2020-06-21 NOTE — Telephone Encounter (Signed)
Left message on machine for patient to return our call for more information concerning the Lisinopril refill.

## 2020-06-27 DIAGNOSIS — R569 Unspecified convulsions: Secondary | ICD-10-CM | POA: Diagnosis not present

## 2020-06-27 DIAGNOSIS — G8320 Monoplegia of upper limb affecting unspecified side: Secondary | ICD-10-CM | POA: Diagnosis not present

## 2020-06-27 DIAGNOSIS — I6350 Cerebral infarction due to unspecified occlusion or stenosis of unspecified cerebral artery: Secondary | ICD-10-CM | POA: Diagnosis not present

## 2020-06-27 DIAGNOSIS — D332 Benign neoplasm of brain, unspecified: Secondary | ICD-10-CM | POA: Diagnosis not present

## 2020-07-11 ENCOUNTER — Encounter: Payer: Self-pay | Admitting: Internal Medicine

## 2020-07-11 DIAGNOSIS — G8220 Paraplegia, unspecified: Secondary | ICD-10-CM

## 2020-07-15 NOTE — Telephone Encounter (Signed)
Patient spouse is calling back to check the status the refill request for [facility]idine [facility] [address], please advise. CB is [phone]

## 2020-07-16 MED ORDER — TIZANIDINE HCL 2 MG PO TABS: 2.0000 mg | ORAL_TABLET | Freq: Two times a day (BID) | ORAL | 1 refills | Status: DC

## 2020-07-17 ENCOUNTER — Ambulatory Visit: Payer: PPO

## 2020-07-25 ENCOUNTER — Telehealth: Payer: Self-pay | Admitting: Internal Medicine

## 2020-07-25 ENCOUNTER — Encounter: Payer: Self-pay | Admitting: Internal Medicine

## 2020-07-25 NOTE — Telephone Encounter (Signed)
[facility] [phone]  Reference #: [id number]  lisinopril (ZESTRIL) 10 MG tablet  lisinopril (ZESTRIL) 20 MG tablet   Which mg is the patient supposed to be taking?

## 2020-07-25 NOTE — Telephone Encounter (Signed)
Can we clarify with Stacy Diaz how much lisinopril she is taking?

## 2020-07-26 ENCOUNTER — Telehealth: Payer: Self-pay | Admitting: Internal Medicine

## 2020-07-26 NOTE — Telephone Encounter (Signed)
Patient is taking both doses.

## 2020-07-26 NOTE — Telephone Encounter (Signed)
Sarah with [facility] is calling needing clarification on pts Rx lisinopril needing to know if the pt is taking both strength 10 MG and 20 MG.  She would like to have a call back.

## 2020-07-26 NOTE — Telephone Encounter (Signed)
See phone note 07/26/20

## 2020-07-26 NOTE — Telephone Encounter (Signed)
Spoke with husband and patient is no longer using [facility].  No refills needed at this time.  thanks

## 2020-07-30 ENCOUNTER — Telehealth (INDEPENDENT_AMBULATORY_CARE_PROVIDER_SITE_OTHER): Payer: PPO | Admitting: Internal Medicine

## 2020-07-30 ENCOUNTER — Encounter: Payer: Self-pay | Admitting: Internal Medicine

## 2020-07-30 DIAGNOSIS — G8220 Paraplegia, unspecified: Secondary | ICD-10-CM | POA: Diagnosis not present

## 2020-07-30 DIAGNOSIS — Z8673 Personal history of transient ischemic attack (TIA), and cerebral infarction without residual deficits: Secondary | ICD-10-CM | POA: Diagnosis not present

## 2020-07-30 MED ORDER — CLOPIDOGREL BISULFATE 75 MG PO TABS: 75.0000 mg | ORAL_TABLET | Freq: Every day | ORAL | 1 refills | Status: DC

## 2020-07-30 NOTE — Progress Notes (Signed)
Virtual Visit via Video Note  I connected with Stacy Diaz on 07/30/20 at  3:00 PM EST by a video enabled telemedicine application and verified that I am speaking with the correct person using two identifiers.  Location patient: home Location provider: work office Persons participating in the virtual visit: patient, provider  I discussed the limitations of evaluation and management by telemedicine and the availability of in person appointments. The patient expressed understanding and agreed to proceed.   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  She has been doing well, she has no acute complaints today  She is looking forward to Christmas.   ROS: Constitutional: Denies fever, chills, diaphoresis, appetite change and fatigue.  HEENT: Denies photophobia, eye pain, redness, hearing loss, ear pain, congestion, sore throat, rhinorrhea, sneezing, mouth sores, trouble swallowing, neck pain, neck stiffness and tinnitus.   Respiratory: Denies SOB, DOE, cough, chest tightness,  and wheezing.   Cardiovascular: Denies chest pain, palpitations and leg swelling.  Gastrointestinal: Denies nausea, vomiting, abdominal pain, diarrhea, constipation, blood in stool and abdominal distention.  Genitourinary: Denies dysuria, urgency, frequency, hematuria, flank pain and difficulty urinating.  Endocrine: Denies: hot or cold intolerance, sweats, changes in hair or nails, polyuria, polydipsia. Musculoskeletal: Denies myalgias, back pain, joint swelling, arthralgias and gait problem.  Skin: Denies pallor, rash and wound.  Neurological: Denies dizziness, seizures, syncope, weakness, light-headedness, numbness and headaches.  Hematological: Denies adenopathy. Easy bruising, personal or family bleeding history   Psychiatric/Behavioral: Denies suicidal ideation, mood changes, confusion, nervousness, sleep disturbance and agitation   Past Medical History:  Diagnosis Date  . Anxiety   . Arthritis    "probably" (08/25/2012)  . Breast cancer of lower-outer quadrant of left female breast ([place]) 05/25/2014  . Headache(784.0)    "not real frequent" (08/25/2012)  . Heart disease   . HTN (hypertension)   . Hyperlipidemia   . Meningioma ([place]) 1999  . Myocardial infarction ([place]) 1987  . 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 S/P radiation therapy 07/30/14-08/30/13   left breast cancer/50Gy  . Seizures ([place])    no seizures since 2014  . Spastic paraparesis    S/P meningioma resection 1999/notes 08/25/2012  . Stroke ([place])    x 2, the last 2014    Past Surgical History:  Procedure Laterality Date  . BRAIN MENINGIOMA EXCISION  05/1997  . BREAST LUMPECTOMY WITH NEEDLE LOCALIZATION AND AXILLARY SENTINEL LYMPH NODE BX Left 06/22/2014   Procedure: LEFT WIRE LOCALIZED LUMPECTOPMY AND SENTINEL NODE MAPPING;  Surgeon: Autumn Messing III, MD;  Location: [facility];  Service: General;  Laterality: Left;  . CARDIAC CATHETERIZATION  1987  . CORONARY ANGIOPLASTY  1987   denied intervention: "blockage wasn't bad enough"  . TIBIA IM NAIL INSERTION Right 04/26/2018   Procedure: INTRAMEDULLARY (IM) NAIL TIBIAL;  Surgeon: Altamese , MD;  Location: [facility];  Service: Orthopedics;  Laterality: Right;    Family History  Problem Relation Age of Onset  . Breast cancer Mother 15       unilateral  . Heart disease Father   . Breast cancer Paternal Grandmother 27       unilateral    SOCIAL HX:   reports that she has quit smoking. Her smoking use included cigarettes. She has a 20.00 pack-year smoking  history. She has never used smokeless tobacco. She reports that she does not drink alcohol and does not use drugs.   Current Outpatient Medications:  .  atenolol (TENORMIN) 50 MG tablet, TAKE 1 & 1/2 TABLET BY  MOUTH TWICE DAILY, Disp: 270 tablet, Rfl: 2 .  atorvastatin (LIPITOR) 10 MG tablet, Take 1 tablet (10 mg total) by mouth daily at 6 PM., Disp: 90 tablet, Rfl: 1 .  baclofen (LIORESAL) 20 MG tablet, TAKE 1 TABLET BY MOUTH 4 TIMES A DAY, Disp: 360 tablet, Rfl: 1 .  Calcium Carb-Cholecalciferol (CALCIUM CARBONATE-VITAMIN D3 PO), Take by mouth. 600mg / 2500 IU, patient takes 2 tablets daily, Disp: , Rfl:  .  diazepam (VALIUM) 5 MG tablet, TAKE 1 TABLET BY MOUTH EVERYDAY AT BEDTIME, Disp: 30 tablet, Rfl: 0 .  lisinopril (ZESTRIL) 10 MG tablet, Take 1 tablet (10 mg total) by mouth daily., Disp: 90 tablet, Rfl: 3 .  lisinopril (ZESTRIL) 20 MG tablet, Take 1 tablet (20 mg total) by mouth 2 (two) times daily., Disp: 180 tablet, Rfl: 1 .  Multiple Vitamin (MULTIVITAMIN WITH MINERALS) TABS tablet, Take 1 tablet by mouth every morning. , Disp: , Rfl:  .  OVER THE COUNTER MEDICATION, Apply 1 application topically daily as needed (pain). Aspercreme with 5% lidocaine, Disp: , Rfl:  .  Oxcarbazepine (TRILEPTAL) 300 MG tablet, Take 1 tablet (300 mg total) by mouth 2 (two) times daily., Disp: 180 tablet, Rfl: 1 .  oxyCODONE-acetaminophen (PERCOCET) 10-325 MG tablet, Take 1 tablet by mouth every 8 (eight) hours as needed for pain., Disp: 270 tablet, Rfl: 0 .  polyethylene glycol (MIRALAX / GLYCOLAX) packet, Take 17 g by mouth daily as needed (for constipation). Mix with liquid and drink, Disp: , Rfl:  .  tiZANidine (ZANAFLEX) 2 MG tablet, Take 1 tablet (2 mg total) by mouth 2 (two) times daily., Disp: 180 tablet, Rfl: 1 .  zinc oxide (BALMEX) 11.3 % CREA cream, Apply 1 application topically at bedtime as needed (IRRITATION). , Disp: , Rfl:  .  clopidogrel (PLAVIX) 75 MG tablet, Take 1 tablet (75 mg total) by mouth daily., Disp: 90 tablet, Rfl: 1  EXAM:   VITALS per patient if applicable: None reported  GENERAL: alert, oriented, appears well and in no acute distress  HEENT: atraumatic, conjunttiva clear, no obvious  abnormalities on inspection of external nose and ears  NECK: normal movements of the head and neck  LUNGS: on inspection no signs of respiratory distress, breathing rate appears normal, no obvious gross increased work of breathing, gasping or wheezing  CV: no obvious cyanosis  MS: moves all visible extremities without noticeable abnormality  PSYCH/NEURO: pleasant and cooperative, no obvious depression or anxiety, speech and thought processing grossly intact  ASSESSMENT AND PLAN:   Paraplegia (HCC)  History of stroke  -Refill her clopidogrel, appropriate to renew her orthotics.     I discussed the assessment and treatment plan with the patient. The patient was provided an opportunity to ask questions and all were answered. The patient agreed with the plan and demonstrated an understanding of the instructions.   The patient was advised to call back or seek an in-person evaluation if the symptoms worsen or if the condition fails to improve as anticipated.    Lelon Frohlich, MD  [facility]

## 2020-08-01 ENCOUNTER — Other Ambulatory Visit: Payer: Self-pay | Admitting: Internal Medicine

## 2020-08-01 ENCOUNTER — Encounter: Payer: Self-pay | Admitting: *Deleted

## 2020-08-01 DIAGNOSIS — Z8673 Personal history of transient ischemic attack (TIA), and cerebral infarction without residual deficits: Secondary | ICD-10-CM

## 2020-08-01 DIAGNOSIS — G8220 Paraplegia, unspecified: Secondary | ICD-10-CM

## 2020-08-06 ENCOUNTER — Telehealth: Payer: Self-pay | Admitting: Hematology and Oncology

## 2020-08-06 NOTE — Telephone Encounter (Signed)
Rescheduled appt due to provider PAL. Spoke to husband who is aware of changes.

## 2020-08-20 ENCOUNTER — Encounter: Payer: Self-pay | Admitting: Internal Medicine

## 2020-08-21 MED ORDER — LISINOPRIL 20 MG PO TABS: 20.0000 mg | ORAL_TABLET | Freq: Two times a day (BID) | ORAL | 1 refills | Status: DC

## 2020-08-28 DIAGNOSIS — Z1231 Encounter for screening mammogram for malignant neoplasm of breast: Secondary | ICD-10-CM | POA: Diagnosis not present

## 2020-08-28 LAB — HM MAMMOGRAPHY

## 2020-09-08 ENCOUNTER — Encounter: Payer: Self-pay | Admitting: Internal Medicine

## 2020-09-08 DIAGNOSIS — R569 Unspecified convulsions: Secondary | ICD-10-CM

## 2020-09-10 MED ORDER — OXCARBAZEPINE 300 MG PO TABS: 300.0000 mg | ORAL_TABLET | Freq: Two times a day (BID) | ORAL | 1 refills | Status: DC

## 2020-09-11 ENCOUNTER — Encounter: Payer: Self-pay | Admitting: Internal Medicine

## 2020-09-11 DIAGNOSIS — G8220 Paraplegia, unspecified: Secondary | ICD-10-CM

## 2020-09-12 ENCOUNTER — Other Ambulatory Visit: Payer: PPO

## 2020-09-12 ENCOUNTER — Telehealth: Payer: Self-pay | Admitting: Internal Medicine

## 2020-09-12 ENCOUNTER — Ambulatory Visit: Payer: PPO

## 2020-09-12 ENCOUNTER — Ambulatory Visit: Payer: PPO | Admitting: Hematology and Oncology

## 2020-09-12 MED ORDER — OXYCODONE-ACETAMINOPHEN 10-325 MG PO TABS: 1.0000 | ORAL_TABLET | Freq: Three times a day (TID) | ORAL | 0 refills | Status: DC | PRN

## 2020-09-12 NOTE — Telephone Encounter (Signed)
FYI:  Pt is calling in to get a refill on Rx Oxycodone -acetaminophen (PERCOCET) 10-325 MG.  Pharm: [facility] [place] [facility]).  They are aware that the medication was sent in to the pharmacy today and was appreciative for it being done today.

## 2020-09-13 ENCOUNTER — Ambulatory Visit: Payer: PPO | Admitting: Hematology and Oncology

## 2020-09-13 ENCOUNTER — Other Ambulatory Visit: Payer: PPO

## 2020-09-13 ENCOUNTER — Ambulatory Visit: Payer: PPO

## 2020-09-15 ENCOUNTER — Encounter: Payer: Self-pay | Admitting: Internal Medicine

## 2020-09-17 MED ORDER — BACLOFEN 20 MG PO TABS: ORAL_TABLET | ORAL | 1 refills | Status: DC

## 2020-09-18 ENCOUNTER — Telehealth: Payer: Self-pay | Admitting: Internal Medicine

## 2020-09-18 NOTE — Telephone Encounter (Signed)
Pts spouse is calling in stating that [employer] [facility] [facility] [employer] has sent in a form for the pt to get her orthotics braces and wanted to know if it was rec'd and sent back.

## 2020-09-24 ENCOUNTER — Other Ambulatory Visit: Payer: Self-pay | Admitting: *Deleted

## 2020-09-24 DIAGNOSIS — C50512 Malignant neoplasm of lower-outer quadrant of left female breast: Secondary | ICD-10-CM

## 2020-09-24 DIAGNOSIS — Z170 Estrogen receptor positive status [ER+]: Secondary | ICD-10-CM

## 2020-09-24 NOTE — Progress Notes (Signed)
HEMATOLOGY-ONCOLOGY MYCHART VIDEO VISIT PROGRESS NOTE  I connected with Stacy Diaz on 09/25/2020 at  2:15 PM EST by MyChart video conference and verified that I am speaking with the correct person using two identifiers.  I discussed the limitations, risks, security and privacy concerns of performing an evaluation and management service by MyChart and the availability of in person appointments.  I also discussed with the patient that there may be a patient responsible charge related to this service. The patient expressed understanding and agreed to proceed.    CHIEF COMPLIANT: Surveillance of breast cancer  INTERVAL HISTORY: SHEETAL Diaz is a 70 y.o. female with above-mentioned history of invasive lobular cancer who underwent a lumpectomy, radiation, and we stopped oral antiestrogen therapy with letrozole last year because of severe osteoporosis. She presents today for annual follow-up.   Oncology History  Breast cancer of lower-outer quadrant of left female breast ([place])  05/21/2014 Mammogram   Mammogram and Ultrasound: Left breast 2 cm complex cystic asymmetric abnormality   05/25/2014 Initial Diagnosis   Invasive mammary cancer with lobular features with perineural invasion, grade 2, ER positive PR positive HER-2 negative, Ki-67 25%   06/22/2014 Surgery   Breast lumpectomy: Invasive lobular carcinoma, grade 3, 2.7 cm, with LCIS, ER positive, PR positive, HER-2 negative, Ki-67 65%, T2, N0, M0 stage II A3 SLN negative; oncotype 16 low risk 10% ROR   07/30/2014 - 08/28/2014 Radiation Therapy   Adjuvant radiation   09/07/2014 -  Anti-estrogen oral therapy   Anastrazole 1 mg daily switch to letrozole 06/25/2015 for myalgias   06/02/2016 - 06/05/2016 Hospital Admission   TIA/CVA/seizures     Observations/Objective:  There were no vitals filed for this visit. There is no height or weight on file to calculate BMI.  I have reviewed the data as listed CMP Latest Ref Rng & Units  03/08/2020 09/13/2019 03/13/2019  Glucose 65 - 99 mg/dL 85 82 104(H)  BUN 7 - 25 mg/dL _0 Creatinine 0.50 - 0.99 mg/dL 0.56 0.69 0.68  Sodium 135 - 146 mmol/L 134(L) 136 130(L)  Potassium 3.5 - 5.3 mmol/L 4.4 4.5 4.2  Chloride 98 - 110 mmol/L 97(L) 100 98  CO2 20 - 32 mmol/L _1 Calcium 8.6 - 10.4 mg/dL 9.5 9.2 9.3  Total Protein 6.1 - 8.1 g/dL 7.0 7.1 7.5  Total Bilirubin 0.2 - 1.2 mg/dL 0.4 0.3 0.4  Alkaline Phos 38 - 126 U/L - 52 52  AST 10 - 35 U/L _2 ALT 6 - 29 U/L _3 Lab Results  Component Value Date   WBC 8.2 03/08/2020   HGB 14.5 03/08/2020   HCT 42.9 03/08/2020   MCV 89.2 03/08/2020   PLT 234 03/08/2020   NEUTROABS 5,076 03/08/2020      Assessment Plan:  Breast cancer of lower-outer quadrant of left female breast ([place]) 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 Oncotype DX recurrence score 16; 10% risk of recurrence, low risk did not need chemotherapy status post radiation therapy completed January 2016, started antiestrogen therapy with Arimidex 1 mg daily 09/07/2014 Switched to letrozole November 2016 discontinued 09/13/2019 because of worsening osteoporosis.Stacy Diaz  letrozole toxicities: Patient has chronic muscle stiffness and aches. These symptoms are much better on letrozole then on anastrozole. We discontinued letrozole therapy because of worsening osteoporosis.  Seizures/CVA: Hospitalization October 2017 Myalgias and arthralgias:Continues  to be a problem Fracture of the left leg with surgery and rod implantation: Chronic pain I discussed with her that her biggest risk is when falling.  Osteoporosis: T score -4: on Prolia every 6 months along with calcium and vitamin D.  She is very anxious and scared about breaking bones because of worsening osteoporosis.  Breast Cancer Surveillance: 1. Breast exam:1/27/2021scar tissue was palpated and no concerns  were identified  2. Mammogram1/22/22No abnormalities. Postsurgical changes. Breast density category C.  3.  Bone density 08/17/2019: T score -4: Severe osteoporosis  She wishes to receive Xgeva at [facility] which I think is perfectly reasonable. Since shes no longer on letrozole, I recommended that she may follow up with her PCP. She can see us on an as needed basis    I discussed the assessment and treatment plan with the patient. The patient was provided an opportunity to ask questions and all were answered. The patient agreed with the plan and demonstrated an understanding of the instructions. The patient was advised to call back or seek an in-person evaluation if the symptoms worsen or if the condition fails to improve as anticipated.   Total time spent: 20 minutes including face-to-face MyChart video visit time and time spent for planning, charting and coordination of care  Vinay K Gudena, MD 09/25/2020   I, Molly Dorshimer, am acting as scribe for Vinay Gudena, MD.  I have reviewed the above documentation for accuracy and completeness, and I agree with the above.   

## 2020-09-25 ENCOUNTER — Other Ambulatory Visit: Payer: Self-pay

## 2020-09-25 ENCOUNTER — Inpatient Hospital Stay: Payer: PPO

## 2020-09-25 ENCOUNTER — Inpatient Hospital Stay (HOSPITAL_BASED_OUTPATIENT_CLINIC_OR_DEPARTMENT_OTHER): Payer: PPO | Admitting: Hematology and Oncology

## 2020-09-25 ENCOUNTER — Other Ambulatory Visit: Payer: Self-pay | Admitting: Hematology and Oncology

## 2020-09-25 ENCOUNTER — Inpatient Hospital Stay: Payer: PPO | Attending: Hematology and Oncology

## 2020-09-25 ENCOUNTER — Encounter: Payer: Self-pay | Admitting: Internal Medicine

## 2020-09-25 VITALS — BP 168/79

## 2020-09-25 DIAGNOSIS — Z79899 Other long term (current) drug therapy: Secondary | ICD-10-CM | POA: Insufficient documentation

## 2020-09-25 DIAGNOSIS — Z170 Estrogen receptor positive status [ER+]: Secondary | ICD-10-CM

## 2020-09-25 DIAGNOSIS — C50512 Malignant neoplasm of lower-outer quadrant of left female breast: Secondary | ICD-10-CM

## 2020-09-25 DIAGNOSIS — M810 Age-related osteoporosis without current pathological fracture: Secondary | ICD-10-CM | POA: Insufficient documentation

## 2020-09-25 DIAGNOSIS — M8000XA Age-related osteoporosis with current pathological fracture, unspecified site, initial encounter for fracture: Secondary | ICD-10-CM

## 2020-09-25 LAB — CBC WITH DIFFERENTIAL (CANCER CENTER ONLY)
Abs Immature Granulocytes: 0.03 10*3/uL (ref 0.00–0.07)
Basophils Absolute: 0 10*3/uL (ref 0.0–0.1)
Basophils Relative: 0 %
Eosinophils Absolute: 0.1 10*3/uL (ref 0.0–0.5)
Eosinophils Relative: 1 %
HCT: 47.3 % — ABNORMAL HIGH (ref 36.0–46.0)
Hemoglobin: 16 g/dL — ABNORMAL HIGH (ref 12.0–15.0)
Immature Granulocytes: 0 %
Lymphocytes Relative: 20 %
Lymphs Abs: 2 10*3/uL (ref 0.7–4.0)
MCH: 30.1 pg (ref 26.0–34.0)
MCHC: 33.8 g/dL (ref 30.0–36.0)
MCV: 88.9 fL (ref 80.0–100.0)
Monocytes Absolute: 0.8 10*3/uL (ref 0.1–1.0)
Monocytes Relative: 7 %
Neutro Abs: 7.3 10*3/uL (ref 1.7–7.7)
Neutrophils Relative %: 72 %
Platelet Count: 236 10*3/uL (ref 150–400)
RBC: 5.32 MIL/uL — ABNORMAL HIGH (ref 3.87–5.11)
RDW: 13.1 % (ref 11.5–15.5)
WBC Count: 10.3 10*3/uL (ref 4.0–10.5)
nRBC: 0 % (ref 0.0–0.2)

## 2020-09-25 LAB — CMP (CANCER CENTER ONLY)
ALT: 22 U/L (ref 0–44)
AST: 21 U/L (ref 15–41)
Albumin: 4.8 g/dL (ref 3.5–5.0)
Alkaline Phosphatase: 58 U/L (ref 38–126)
Anion gap: 11 (ref 5–15)
BUN: 13 mg/dL (ref 8–23)
CO2: 24 mmol/L (ref 22–32)
Calcium: 9.6 mg/dL (ref 8.9–10.3)
Chloride: 96 mmol/L — ABNORMAL LOW (ref 98–111)
Creatinine: 0.67 mg/dL (ref 0.44–1.00)
GFR, Estimated: >60 mL/min (ref >60)
Glucose, Bld: 95 mg/dL (ref 70–99)
Potassium: 4.3 mmol/L (ref 3.5–5.1)
Sodium: 131 mmol/L — ABNORMAL LOW (ref 135–145)
Total Bilirubin: 0.6 mg/dL (ref 0.3–1.2)
Total Protein: 8.1 g/dL (ref 6.5–8.1)

## 2020-09-25 MED ORDER — DENOSUMAB 60 MG/ML ~~LOC~~ SOSY
60.0000 mg | PREFILLED_SYRINGE | Freq: Once | SUBCUTANEOUS | Status: AC
  Administered 2020-09-25: 60 mg via SUBCUTANEOUS

## 2020-09-25 MED ORDER — DENOSUMAB 60 MG/ML ~~LOC~~ SOSY
PREFILLED_SYRINGE | SUBCUTANEOUS | Status: AC
  Filled 2020-09-25: qty 1

## 2020-09-25 NOTE — Assessment & Plan Note (Signed)
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 Oncotype DX recurrence score 16; 10% risk of recurrence, low risk did not need chemotherapy status post radiation therapy completed January 2016, started antiestrogen therapy with Arimidex 1 mg daily 09/07/2014 Switched to letrozole November 2016 discontinued 09/13/2019 because of worsening osteoporosis..  letrozole toxicities: Patient has chronic muscle stiffness and aches. These symptoms are much better on letrozole then on anastrozole. We discontinued letrozole therapy because of worsening osteoporosis.  Seizures/CVA: Hospitalization October 2017 Myalgias and arthralgias:Continues to be a problem Fracture of the left leg with surgery and rod implantation: Chronic pain I discussed with her that her biggest risk is when falling.  Osteoporosis: T score -4: on Prolia every 6 months along with calcium and vitamin D.  She is very anxious and scared about breaking bones because of worsening osteoporosis.  Breast Cancer Surveillance: 1. Breast exam:1/27/2021scar tissue was palpated and no concerns were identified  2. Mammogram1/22/22No abnormalities. Postsurgical changes. Breast density category C.  3.  Bone density 08/17/2019: T score -4: Severe osteoporosis  Return to clinic in 1 year for follow-upand every 6 months for Prolia injections with labs. 

## 2020-09-25 NOTE — Patient Instructions (Signed)
Denosumab injection What is this medicine? DENOSUMAB (den oh sue mab) slows bone breakdown. Prolia is used to treat osteoporosis in women after menopause and in men, and in people who are taking corticosteroids for 6 months or more. Xgeva is used to treat a high calcium level due to cancer and to prevent bone fractures and other bone problems caused by multiple myeloma or cancer bone metastases. Xgeva is also used to treat giant cell tumor of the bone. This medicine may be used for other purposes; ask your health care provider or pharmacist if you have questions. COMMON BRAND NAME(S): Prolia, XGEVA What should I tell my health care provider before I take this medicine? They need to know if you have any of these conditions:  dental disease  having surgery or tooth extraction  infection  kidney disease  low levels of calcium or Vitamin D in the blood  malnutrition  on hemodialysis  skin conditions or sensitivity  thyroid or parathyroid disease  an unusual reaction to denosumab, other medicines, foods, dyes, or preservatives  pregnant or trying to get pregnant  breast-feeding How should I use this medicine? This medicine is for injection under the skin. It is given by a health care professional in a hospital or clinic setting. A special MedGuide will be given to you before each treatment. Be sure to read this information carefully each time. For Prolia, talk to your pediatrician regarding the use of this medicine in children. Special care may be needed. For Xgeva, talk to your pediatrician regarding the use of this medicine in children. While this drug may be prescribed for children as young as 13 years for selected conditions, precautions do apply. Overdosage: If you think you have taken too much of this medicine contact a poison control center or emergency room at once. NOTE: This medicine is only for you. Do not share this medicine with others. What if I miss a dose? It is  important not to miss your dose. Call your doctor or health care professional if you are unable to keep an appointment. What may interact with this medicine? Do not take this medicine with any of the following medications:  other medicines containing denosumab This medicine may also interact with the following medications:  medicines that lower your chance of fighting infection  steroid medicines like prednisone or cortisone This list may not describe all possible interactions. Give your health care provider a list of all the medicines, herbs, non-prescription drugs, or dietary supplements you use. Also tell them if you smoke, drink alcohol, or use illegal drugs. Some items may interact with your medicine. What should I watch for while using this medicine? Visit your doctor or health care professional for regular checks on your progress. Your doctor or health care professional may order blood tests and other tests to see how you are doing. Call your doctor or health care professional for advice if you get a fever, chills or sore throat, or other symptoms of a cold or flu. Do not treat yourself. This drug may decrease your body's ability to fight infection. Try to avoid being around people who are sick. You should make sure you get enough calcium and vitamin D while you are taking this medicine, unless your doctor tells you not to. Discuss the foods you eat and the vitamins you take with your health care professional. See your dentist regularly. Brush and floss your teeth as directed. Before you have any dental work done, tell your dentist you are   receiving this medicine. Do not become pregnant while taking this medicine or for 5 months after stopping it. Talk with your doctor or health care professional about your birth control options while taking this medicine. Women should inform their doctor if they wish to become pregnant or think they might be pregnant. There is a potential for serious side  effects to an unborn child. Talk to your health care professional or pharmacist for more information. What side effects may I notice from receiving this medicine? Side effects that you should report to your doctor or health care professional as soon as possible:  allergic reactions like skin rash, itching or hives, swelling of the face, lips, or tongue  bone pain  breathing problems  dizziness  jaw pain, especially after dental work  redness, blistering, peeling of the skin  signs and symptoms of infection like fever or chills; cough; sore throat; pain or trouble passing urine  signs of low calcium like fast heartbeat, muscle cramps or muscle pain; pain, tingling, numbness in the hands or feet; seizures  unusual bleeding or bruising  unusually weak or tired Side effects that usually do not require medical attention (report to your doctor or health care professional if they continue or are bothersome):  constipation  diarrhea  headache  joint pain  loss of appetite  muscle pain  runny nose  tiredness  upset stomach This list may not describe all possible side effects. Call your doctor for medical advice about side effects. You may report side effects to FDA at 1-800-FDA-1088. Where should I keep my medicine? This medicine is only given in a clinic, doctor's office, or other health care setting and will not be stored at home. NOTE: This sheet is a summary. It may not cover all possible information. If you have questions about this medicine, talk to your doctor, pharmacist, or health care provider.  2021 Elsevier/Gold Standard (2017-12-10 16:10:44)

## 2020-10-02 ENCOUNTER — Encounter: Payer: Self-pay | Admitting: Hematology and Oncology

## 2020-10-09 ENCOUNTER — Other Ambulatory Visit: Payer: Self-pay | Admitting: Internal Medicine

## 2020-10-14 DIAGNOSIS — G8220 Paraplegia, unspecified: Secondary | ICD-10-CM | POA: Diagnosis not present

## 2020-10-14 DIAGNOSIS — Z8673 Personal history of transient ischemic attack (TIA), and cerebral infarction without residual deficits: Secondary | ICD-10-CM | POA: Diagnosis not present

## 2020-11-10 ENCOUNTER — Encounter: Payer: Self-pay | Admitting: Internal Medicine

## 2020-11-11 MED ORDER — ATENOLOL 50 MG PO TABS: ORAL_TABLET | ORAL | 0 refills | Status: DC

## 2020-11-18 ENCOUNTER — Other Ambulatory Visit: Payer: Self-pay | Admitting: Internal Medicine

## 2020-11-18 ENCOUNTER — Encounter: Payer: Self-pay | Admitting: Internal Medicine

## 2020-11-18 DIAGNOSIS — G8220 Paraplegia, unspecified: Secondary | ICD-10-CM

## 2020-11-19 MED ORDER — DIAZEPAM 5 MG PO TABS: ORAL_TABLET | ORAL | 0 refills | Status: DC

## 2020-11-19 NOTE — Telephone Encounter (Signed)
Please deny.  Duplicate - refill faxed

## 2020-12-04 ENCOUNTER — Telehealth (INDEPENDENT_AMBULATORY_CARE_PROVIDER_SITE_OTHER): Payer: PPO | Admitting: Internal Medicine

## 2020-12-04 DIAGNOSIS — Z8673 Personal history of transient ischemic attack (TIA), and cerebral infarction without residual deficits: Secondary | ICD-10-CM | POA: Diagnosis not present

## 2020-12-04 DIAGNOSIS — F419 Anxiety disorder, unspecified: Secondary | ICD-10-CM

## 2020-12-04 DIAGNOSIS — G8220 Paraplegia, unspecified: Secondary | ICD-10-CM | POA: Diagnosis not present

## 2020-12-04 MED ORDER — OXYCODONE-ACETAMINOPHEN 10-325 MG PO TABS: 1.0000 | ORAL_TABLET | Freq: Three times a day (TID) | ORAL | 0 refills | Status: DC | PRN

## 2020-12-04 MED ORDER — DIAZEPAM 5 MG PO TABS: ORAL_TABLET | ORAL | 2 refills | Status: DC

## 2020-12-04 NOTE — Progress Notes (Signed)
Virtual Visit via Video Note  I connected with Stacy Diaz on 12/04/20 at  3:30 PM EDT by a video enabled telemedicine application and verified that I am speaking with the correct person using two identifiers.  Location patient: home Location provider: work office Persons participating in the virtual visit: patient, provider  I discussed the limitations of evaluation and management by telemedicine and the availability of in person appointments. The patient expressed understanding and agreed to proceed.   HPI: She has scheduled this visit for medication refills per narcotic contract.  She is on oxycodone 10/325 mg that she takes every 8 hours as needed for pain.  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  She would also like to discuss with me her anxiety.  She is currently prescribed diazepam 5 mg that she takes once in the morning and once in the evening.  She has noticed that at around 2 PM she starts feeling extremely jittery and anxious.  She is wondering if I would be amenable to prescribing Ativan that she has tried before.   ROS: Constitutional: Denies fever, chills, diaphoresis, appetite change and fatigue.  HEENT: Denies photophobia, eye pain, redness, hearing loss, ear pain, congestion, sore throat, rhinorrhea, sneezing, mouth sores, trouble swallowing, neck pain, neck stiffness and tinnitus.   Respiratory: Denies SOB, DOE, cough, chest tightness,  and wheezing.   Cardiovascular: Denies chest pain, palpitations and leg swelling.  Gastrointestinal: Denies nausea, vomiting, abdominal pain, diarrhea, constipation, blood in stool and abdominal distention.  Genitourinary: Denies dysuria, urgency, frequency, hematuria, flank pain and difficulty urinating.  Endocrine: Denies: hot or cold intolerance, sweats, changes in hair or nails, polyuria, polydipsia. Musculoskeletal: Denies myalgias, back pain, joint swelling, arthralgias and gait problem.  Skin: Denies  pallor, rash and wound.  Neurological: Denies dizziness, seizures, syncope, weakness, light-headedness, numbness and headaches.  Hematological: Denies adenopathy. Easy bruising, personal or family bleeding history  Psychiatric/Behavioral: Denies suicidal ideation, mood changes, confusion,  sleep disturbance   Past Medical History:  Diagnosis Date  . Anxiety   . Arthritis    "probably" (08/25/2012)  . Breast cancer of lower-outer quadrant of left female breast ([place]) 05/25/2014  . Headache(784.0)    "not real frequent" (08/25/2012)  . Heart disease   . HTN (hypertension)   . Hyperlipidemia   . Meningioma ([place]) 1999  . Myocardial infarction ([place]) 1987  . 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 S/P radiation therapy 07/30/14-08/30/13   left breast cancer/50Gy  . Seizures ([place])    no seizures since 2014  . Spastic paraparesis    S/P meningioma resection 1999/notes 08/25/2012  . Stroke ([place])    x 2, the last 2014    Past Surgical History:  Procedure Laterality Date  . BRAIN MENINGIOMA EXCISION  05/1997  . BREAST LUMPECTOMY WITH NEEDLE LOCALIZATION AND AXILLARY SENTINEL LYMPH NODE BX Left 06/22/2014   Procedure: LEFT WIRE LOCALIZED LUMPECTOPMY AND SENTINEL NODE MAPPING;  Surgeon: Autumn Messing III, MD;  Location: [facility];  Service: General;  Laterality: Left;  . CARDIAC CATHETERIZATION  1987  . CORONARY ANGIOPLASTY  1987   denied intervention: "blockage wasn't bad enough"  . TIBIA IM NAIL INSERTION Right 04/26/2018   Procedure: INTRAMEDULLARY (IM) NAIL TIBIAL;  Surgeon: Altamese Tetlin, MD;  Location: [facility];  Service: Orthopedics;  Laterality: Right;    Family History  Problem Relation Age of Onset  . Breast cancer Mother 75  unilateral  . Heart disease Father   . Breast cancer Paternal Grandmother 57       unilateral    SOCIAL HX:   reports that she has quit smoking. Her smoking use included cigarettes. She has a 20.00 pack-year smoking history. She has never  used smokeless tobacco. She reports that she does not drink alcohol and does not use drugs.   Current Outpatient Medications:  .  atenolol (TENORMIN) 50 MG tablet, TAKE 1 & 1/2 TABLET BY MOUTH TWICE DAILY, Disp: 270 tablet, Rfl: 0 .  atorvastatin (LIPITOR) 10 MG tablet, TAKE 1 TABLET (10 MG TOTAL) BY MOUTH DAILY AT 6 PM., Disp: 90 tablet, Rfl: 1 .  baclofen (LIORESAL) 20 MG tablet, TAKE 1 TABLET BY MOUTH 4 TIMES A DAY, Disp: 360 tablet, Rfl: 1 .  Calcium Carb-Cholecalciferol (CALCIUM CARBONATE-VITAMIN D3 PO), Take by mouth. 600mg / 2500 IU, patient takes 2 tablets daily, Disp: , Rfl:  .  clopidogrel (PLAVIX) 75 MG tablet, Take 1 tablet (75 mg total) by mouth daily., Disp: 90 tablet, Rfl: 1 .  lisinopril (ZESTRIL) 10 MG tablet, Take 1 tablet (10 mg total) by mouth daily., Disp: 90 tablet, Rfl: 3 .  lisinopril (ZESTRIL) 20 MG tablet, Take 1 tablet (20 mg total) by mouth 2 (two) times daily., Disp: 180 tablet, Rfl: 1 .  Multiple Vitamin (MULTIVITAMIN WITH MINERALS) TABS tablet, Take 1 tablet by mouth every morning. , Disp: , Rfl:  .  OVER THE COUNTER MEDICATION, Apply 1 application topically daily as needed (pain). Aspercreme with 5% lidocaine, Disp: , Rfl:  .  Oxcarbazepine (TRILEPTAL) 300 MG tablet, Take 1 tablet (300 mg total) by mouth 2 (two) times daily., Disp: 180 tablet, Rfl: 1 .  polyethylene glycol (MIRALAX / GLYCOLAX) packet, Take 17 g by mouth daily as needed (for constipation). Mix with liquid and drink, Disp: , Rfl:  .  tiZANidine (ZANAFLEX) 2 MG tablet, Take 1 tablet (2 mg total) by mouth 2 (two) times daily., Disp: 180 tablet, Rfl: 1 .  zinc oxide (BALMEX) 11.3 % CREA cream, Apply 1 application topically at bedtime as needed (IRRITATION). , Disp: , Rfl:  .  diazepam (VALIUM) 5 MG tablet, Take 1 and a half tablet in the morning and 1 tablet at bedtime., Disp: 75 tablet, Rfl: 2 .  oxyCODONE-acetaminophen (PERCOCET) 10-325 MG tablet, Take 1 tablet by mouth every 8 (eight) hours as needed  for pain., Disp: 270 tablet, Rfl: 0  EXAM:   VITALS per patient if applicable: None reported  GENERAL: alert, oriented, appears well and in no acute distress  HEENT: atraumatic, conjunttiva clear, no obvious abnormalities on inspection of external nose and ears  NECK: normal movements of the head and neck  LUNGS: on inspection no signs of respiratory distress, breathing rate appears normal, no obvious gross increased work of breathing, gasping or wheezing  CV: no obvious cyanosis  MS: moves all visible extremities without noticeable abnormality  PSYCH/NEURO: pleasant and cooperative, no obvious depression or anxiety, speech and thought processing grossly intact  ASSESSMENT AND PLAN:   History of stroke Paraplegia (HCC)  Anxiety  -PDMP reviewed, overdose risk score 280, she has 1 red flag for prescribed greater than 40 MME per day.  She has been on this dose for years. -I will refill her Percocet 10/325 mg to take 1 tablet every 8 hours as needed for pain for total of 270 tablets which should last her a full 90 days. -Instead of adding Ativan, since she is already  on diazepam 5 mg twice daily, we will try increasing the morning dose to 1-1/2 tablet (7.5 mg) and keeping the evening dose at 5 mg since she seems to be experiencing some withdrawal symptoms in the early afternoon. -She will reach out to me if she experiences excessive drowsiness or sedation in the morning hours.     I discussed the assessment and treatment plan with the patient. The patient was provided an opportunity to ask questions and all were answered. The patient agreed with the plan and demonstrated an understanding of the instructions.   The patient was advised to call back or seek an in-person evaluation if the symptoms worsen or if the condition fails to improve as anticipated.    Lelon Frohlich, MD  [facility] at [facility]

## 2020-12-12 ENCOUNTER — Encounter: Payer: Self-pay | Admitting: Internal Medicine

## 2020-12-13 MED ORDER — LISINOPRIL 10 MG PO TABS: 10.0000 mg | ORAL_TABLET | Freq: Every day | ORAL | 1 refills | Status: DC

## 2020-12-13 MED ORDER — BACLOFEN 20 MG PO TABS: ORAL_TABLET | ORAL | 1 refills | Status: DC

## 2020-12-25 ENCOUNTER — Encounter: Payer: Self-pay | Admitting: Internal Medicine

## 2020-12-26 NOTE — Telephone Encounter (Signed)
Spoke with husband and an appointment scheduled  

## 2020-12-30 ENCOUNTER — Other Ambulatory Visit: Payer: Self-pay

## 2020-12-31 ENCOUNTER — Encounter: Payer: Self-pay | Admitting: Internal Medicine

## 2020-12-31 ENCOUNTER — Ambulatory Visit (INDEPENDENT_AMBULATORY_CARE_PROVIDER_SITE_OTHER): Payer: PPO | Admitting: Internal Medicine

## 2020-12-31 VITALS — BP 180/96 | HR 64 | Temp 98.7°F

## 2020-12-31 DIAGNOSIS — R531 Weakness: Secondary | ICD-10-CM

## 2020-12-31 DIAGNOSIS — G8220 Paraplegia, unspecified: Secondary | ICD-10-CM

## 2020-12-31 DIAGNOSIS — I10 Essential (primary) hypertension: Secondary | ICD-10-CM | POA: Diagnosis not present

## 2020-12-31 DIAGNOSIS — Z9889 Other specified postprocedural states: Secondary | ICD-10-CM

## 2020-12-31 DIAGNOSIS — Z8603 Personal history of neoplasm of uncertain behavior: Secondary | ICD-10-CM | POA: Diagnosis not present

## 2020-12-31 DIAGNOSIS — Z8673 Personal history of transient ischemic attack (TIA), and cerebral infarction without residual deficits: Secondary | ICD-10-CM | POA: Diagnosis not present

## 2020-12-31 MED ORDER — DIAZEPAM 5 MG PO TABS: 5.0000 mg | ORAL_TABLET | Freq: Every day | ORAL | 2 refills | Status: DC

## 2020-12-31 NOTE — Patient Instructions (Signed)
-  Nice seeing you today!!  -Lab work today; will notify you once results are available.  -Take valium once a day only.  -Continue to hold off on tizanidine.  -Come back in 6 weeks if not better.

## 2020-12-31 NOTE — Progress Notes (Signed)
Established Patient Office Visit     This visit occurred during the SARS-CoV-2 public health emergency.  Safety protocols were in place, including screening questions prior to the visit, additional usage of staff PPE, and extensive cleaning of exam room while observing appropriate contact time as indicated for disinfecting solutions.    CC/Reason for Visit: Generalized weakness above baseline  HPI: Stacy Diaz is a 70 y.o. female who is coming in today for the above mentioned reasons. Past Medical History is significant for: She had a large meningioma resected and had a stroke that left her paralyzed and in a wheelchair.  She is here with her husband today.  Both her and her husband describe about a 4-week timeframe during which her weakness has significantly progressed.  She is having difficulty transferring out of her wheelchair.  She is having difficulty picking up things with her hands that she is normally able to.  She has not had lab work since July.  At her last visit towards the end of April she had requested an increased dose of her Valium due to breakthrough anxiety issues.  Her husband decided to discontinue her tizanidine because of the weakness.  She continues to take the diazepam twice daily.  They have noticed some elevated blood pressures at times.  She feels like she has slowed down in her thinking and speaking.  She thinks she might have had a TIA last week as she "felt funny".  She cannot describe that further.  Ever since her initial stroke she has had several TIAs.  At this point they do not go to the emergency department unless symptoms are severe.  When she had her brain surgery she had wires placed in her skull that make it difficult to do imaging as it interferes with the signal.  She denies any urinary or URI symptoms.   Past Medical/Surgical History: Past Medical History:  Diagnosis Date  . Anxiety   . Arthritis    "probably" (08/25/2012)  . Breast cancer of  lower-outer quadrant of left female breast ([place]) 05/25/2014  . Headache(784.0)    "not real frequent" (08/25/2012)  . Heart disease   . HTN (hypertension)   . Hyperlipidemia   . Meningioma ([place]) 1999  . Myocardial infarction ([place]) 1987  . 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 S/P radiation therapy 07/30/14-08/30/13   left breast cancer/50Gy  . Seizures ([place])    no seizures since 2014  . Spastic paraparesis    S/P meningioma resection 1999/notes 08/25/2012  . Stroke ([place])    x 2, the last 2014    Past Surgical History:  Procedure Laterality Date  . BRAIN MENINGIOMA EXCISION  05/1997  . BREAST LUMPECTOMY WITH NEEDLE LOCALIZATION AND AXILLARY SENTINEL LYMPH NODE BX Left 06/22/2014   Procedure: LEFT WIRE LOCALIZED LUMPECTOPMY AND SENTINEL NODE MAPPING;  Surgeon: Autumn Messing III, MD;  Location: [facility];  Service: General;  Laterality: Left;  . CARDIAC CATHETERIZATION  1987  . CORONARY ANGIOPLASTY  1987   denied intervention: "blockage wasn't bad enough"  . TIBIA IM NAIL INSERTION Right 04/26/2018   Procedure: INTRAMEDULLARY (IM) NAIL TIBIAL;  Surgeon: Altamese Gentry, MD;  Location: [facility];  Service: Orthopedics;  Laterality: Right;    Social History:  reports that she has quit smoking. Her smoking use included cigarettes. She has a 20.00 pack-year smoking history. She has never used smokeless tobacco. She reports that she does not drink alcohol and does  not use drugs.  Allergies: No Known Allergies  Family History:  Family History  Problem Relation Age of Onset  . Breast cancer Mother 34       unilateral  . Heart disease Father   . Breast cancer Paternal Grandmother 17       unilateral     Current Outpatient Medications:  .  atenolol (TENORMIN) 50 MG tablet, TAKE 1 & 1/2 TABLET BY MOUTH TWICE DAILY, Disp: 270 tablet, Rfl: 0 .  atorvastatin (LIPITOR) 10 MG tablet, TAKE 1 TABLET (10 MG TOTAL) BY MOUTH DAILY AT 6 PM., Disp: 90 tablet, Rfl: 1 .  baclofen  (LIORESAL) 20 MG tablet, TAKE 1 TABLET BY MOUTH 4 TIMES A DAY, Disp: 360 tablet, Rfl: 1 .  Calcium Carb-Cholecalciferol (CALCIUM CARBONATE-VITAMIN D3 PO), Take by mouth. 600mg / 2500 IU, patient takes 2 tablets daily, Disp: , Rfl:  .  clopidogrel (PLAVIX) 75 MG tablet, Take 1 tablet (75 mg total) by mouth daily., Disp: 90 tablet, Rfl: 1 .  lisinopril (ZESTRIL) 10 MG tablet, Take 1 tablet (10 mg total) by mouth daily., Disp: 90 tablet, Rfl: 1 .  lisinopril (ZESTRIL) 20 MG tablet, Take 1 tablet (20 mg total) by mouth 2 (two) times daily., Disp: 180 tablet, Rfl: 1 .  Multiple Vitamin (MULTIVITAMIN WITH MINERALS) TABS tablet, Take 1 tablet by mouth every morning. , Disp: , Rfl:  .  OVER THE COUNTER MEDICATION, Apply 1 application topically daily as needed (pain). Aspercreme with 5% lidocaine, Disp: , Rfl:  .  Oxcarbazepine (TRILEPTAL) 300 MG tablet, Take 1 tablet (300 mg total) by mouth 2 (two) times daily., Disp: 180 tablet, Rfl: 1 .  oxyCODONE-acetaminophen (PERCOCET) 10-325 MG tablet, Take 1 tablet by mouth every 8 (eight) hours as needed for pain., Disp: 270 tablet, Rfl: 0 .  polyethylene glycol (MIRALAX / GLYCOLAX) packet, Take 17 g by mouth daily as needed (for constipation). Mix with liquid and drink, Disp: , Rfl:  .  zinc oxide (BALMEX) 11.3 % CREA cream, Apply 1 application topically at bedtime as needed (IRRITATION). , Disp: , Rfl:  .  diazepam (VALIUM) 5 MG tablet, Take 1 tablet (5 mg total) by mouth daily. Take 1 and a half tablet in the morning and 1 tablet at bedtime., Disp: 75 tablet, Rfl: 2 .  tiZANidine (ZANAFLEX) 2 MG tablet, Take 1 tablet (2 mg total) by mouth 2 (two) times daily. (Patient not taking: Reported on 12/31/2020), Disp: 180 tablet, Rfl: 1  Review of Systems:  Constitutional: Denies fever, chills, diaphoresis, appetite change. HEENT: Denies photophobia, eye pain, redness, hearing loss, ear pain, congestion, sore throat, rhinorrhea, sneezing, mouth sores, trouble swallowing,  neck pain, neck stiffness and tinnitus.   Respiratory: Denies SOB, DOE, cough, chest tightness,  and wheezing.   Cardiovascular: Denies chest pain, palpitations and leg swelling.  Gastrointestinal: Denies nausea, vomiting, abdominal pain, diarrhea, constipation, blood in stool and abdominal distention.  Genitourinary: Denies dysuria, urgency, frequency, hematuria, flank pain and difficulty urinating.  Endocrine: Denies: hot or cold intolerance, sweats, changes in hair or nails, polyuria, polydipsia. Musculoskeletal: Denies myalgias, back pain, joint swelling, arthralgias and gait problem.  Skin: Denies pallor, rash and wound.  Neurological: Denies dizziness, seizures, syncope,  light-headedness, numbness and headaches.  Hematological: Denies adenopathy. Easy bruising, personal or family bleeding history  Psychiatric/Behavioral: Denies suicidal ideation, mood changes, confusion, nervousness, sleep disturbance and agitation    Physical Exam: Vitals:   12/31/20 1553  BP: (!) 180/96  Pulse: 64  Temp: 98.7 F (  37.1 C)  TempSrc: Oral  SpO2: 97%    There is no height or weight on file to calculate BMI.   Constitutional: NAD, calm, comfortable, wheelchair-bound, speech appears baseline for her Eyes: PERRL, lids and conjunctivae normal, wears corrective lenses ENMT: Mucous membranes are moist. Respiratory: clear to auscultation bilaterally, no wheezing, no crackles. Normal respiratory effort. No accessory muscle use.  Cardiovascular: Regular rate and rhythm, no murmurs / rubs / gallops.  Psychiatric: Normal judgment and insight. Alert and oriented x 3. Normal mood.    Impression and Plan:  Generalized weakness History of stroke History of resection of meningioma Essential hypertension Paraplegia ([place])    - Plan: CBC with Differential/Platelet, Comprehensive metabolic panel, Magnesium, Urinalysis with Reflex Microscopic, Culture, Urine -I will order the above labs to rule out  electrolyte deficiencies or UTI as an etiology. -Given absence of fever, cough and normal lung auscultation, I do not believe chest x-ray is necessary today. -I did consider ordering brain imaging, however given the issues with brain imaging in the past and the wire in her skull I will leave this as a last resort. -I do believe the most likely etiology is her polypharmacy.  The most recent addition was the doubling dose of her Valium.  I have asked patient and husband to cut back down to only once daily Valium, since she is not getting the tizanidine right now I have asked that they hold off on that as well. -Further work-up to follow. -I have advised that she return in 6 weeks if no improvement.  Time spent: 34 minutes reviewing chart, interviewing patient and her husband, examining patient and formulating plan of care.   Patient Instructions  -Nice seeing you today!!  -Lab work today; will notify you once results are available.  -Take valium once a day only.  -Continue to hold off on tizanidine.  -Come back in 6 weeks if not better.     Lelon Frohlich, MD [facility] at [facility]

## 2021-01-01 ENCOUNTER — Other Ambulatory Visit: Payer: Self-pay

## 2021-01-01 ENCOUNTER — Other Ambulatory Visit (INDEPENDENT_AMBULATORY_CARE_PROVIDER_SITE_OTHER): Payer: PPO

## 2021-01-01 DIAGNOSIS — R531 Weakness: Secondary | ICD-10-CM | POA: Diagnosis not present

## 2021-01-02 ENCOUNTER — Other Ambulatory Visit: Payer: Self-pay | Admitting: Internal Medicine

## 2021-01-02 DIAGNOSIS — N3000 Acute cystitis without hematuria: Secondary | ICD-10-CM

## 2021-01-02 DIAGNOSIS — E871 Hypo-osmolality and hyponatremia: Secondary | ICD-10-CM

## 2021-01-02 LAB — COMPREHENSIVE METABOLIC PANEL
ALT: 29 U/L (ref 0–35)
AST: 24 U/L (ref 0–37)
Albumin: 4.6 g/dL (ref 3.5–5.2)
Alkaline Phosphatase: 51 U/L (ref 39–117)
BUN: 12 mg/dL (ref 6–23)
CO2: 24 mEq/L (ref 19–32)
Calcium: 9.7 mg/dL (ref 8.4–10.5)
Chloride: 96 mEq/L (ref 96–112)
Creatinine, Ser: 0.51 mg/dL (ref 0.40–1.20)
GFR: 95.08 mL/min (ref >60.00)
Glucose, Bld: 99 mg/dL (ref 70–99)
Potassium: 4.4 mEq/L (ref 3.5–5.1)
Sodium: 129 mEq/L — ABNORMAL LOW (ref 135–145)
Total Bilirubin: 0.4 mg/dL (ref 0.2–1.2)
Total Protein: 7.4 g/dL (ref 6.0–8.3)

## 2021-01-02 LAB — CBC WITH DIFFERENTIAL/PLATELET
Basophils Absolute: 0.1 10*3/uL (ref 0.0–0.1)
Basophils Relative: 0.8 % (ref 0.0–3.0)
Eosinophils Absolute: 0.1 10*3/uL (ref 0.0–0.7)
Eosinophils Relative: 0.8 % (ref 0.0–5.0)
HCT: 42.6 % (ref 36.0–46.0)
Hemoglobin: 14.9 g/dL (ref 12.0–15.0)
Lymphocytes Relative: 27.5 % (ref 12.0–46.0)
Lymphs Abs: 2.3 10*3/uL (ref 0.7–4.0)
MCHC: 35 g/dL (ref 30.0–36.0)
MCV: 89.5 fl (ref 78.0–100.0)
Monocytes Absolute: 0.7 10*3/uL (ref 0.1–1.0)
Monocytes Relative: 8 % (ref 3.0–12.0)
Neutro Abs: 5.2 10*3/uL (ref 1.4–7.7)
Neutrophils Relative %: 62.9 % (ref 43.0–77.0)
Platelets: 246 10*3/uL (ref 150.0–400.0)
RBC: 4.76 Mil/uL (ref 3.87–5.11)
RDW: 13.4 % (ref 11.5–15.5)
WBC: 8.3 10*3/uL (ref 4.0–10.5)

## 2021-01-02 LAB — URINALYSIS, ROUTINE W REFLEX MICROSCOPIC
Bilirubin Urine: NEGATIVE
Ketones, ur: NEGATIVE
Nitrite: NEGATIVE
Specific Gravity, Urine: 1.01 (ref 1.000–1.030)
Urine Glucose: NEGATIVE
Urobilinogen, UA: 0.2 (ref 0.0–1.0)
pH: 6.5 (ref 5.0–8.0)

## 2021-01-02 LAB — MAGNESIUM: Magnesium: 2 mg/dL (ref 1.5–2.5)

## 2021-01-02 MED ORDER — SULFAMETHOXAZOLE-TRIMETHOPRIM 800-160 MG PO TABS: 1.0000 | ORAL_TABLET | Freq: Two times a day (BID) | ORAL | 0 refills | Status: AC

## 2021-01-02 MED ORDER — SODIUM BICARBONATE 650 MG PO TABS: 650.0000 mg | ORAL_TABLET | Freq: Every day | ORAL | 0 refills | Status: AC

## 2021-01-02 NOTE — Addendum Note (Signed)
Addended by: Janann Colonel on: 01/02/2021 08:51 AM   Modules accepted: Orders

## 2021-01-03 LAB — URINE CULTURE
MICRO NUMBER:: 11905832
SPECIMEN QUALITY:: ADEQUATE

## 2021-01-06 ENCOUNTER — Other Ambulatory Visit: Payer: Self-pay | Admitting: Internal Medicine

## 2021-01-06 DIAGNOSIS — G8220 Paraplegia, unspecified: Secondary | ICD-10-CM

## 2021-01-16 ENCOUNTER — Other Ambulatory Visit: Payer: Self-pay

## 2021-01-16 ENCOUNTER — Encounter: Payer: Self-pay | Admitting: Internal Medicine

## 2021-01-16 ENCOUNTER — Other Ambulatory Visit (INDEPENDENT_AMBULATORY_CARE_PROVIDER_SITE_OTHER): Payer: PPO

## 2021-01-16 DIAGNOSIS — E871 Hypo-osmolality and hyponatremia: Secondary | ICD-10-CM | POA: Diagnosis not present

## 2021-01-17 LAB — BASIC METABOLIC PANEL
BUN: 15 mg/dL (ref 6–23)
CO2: 22 mEq/L (ref 19–32)
Calcium: 9.6 mg/dL (ref 8.4–10.5)
Chloride: 97 mEq/L (ref 96–112)
Creatinine, Ser: 0.51 mg/dL (ref 0.40–1.20)
GFR: 95.05 mL/min (ref >60.00)
Glucose, Bld: 99 mg/dL (ref 70–99)
Potassium: 4.5 mEq/L (ref 3.5–5.1)
Sodium: 130 mEq/L — ABNORMAL LOW (ref 135–145)

## 2021-01-21 ENCOUNTER — Other Ambulatory Visit: Payer: Self-pay | Admitting: Internal Medicine

## 2021-01-21 ENCOUNTER — Encounter: Payer: Self-pay | Admitting: Internal Medicine

## 2021-01-21 DIAGNOSIS — Z8673 Personal history of transient ischemic attack (TIA), and cerebral infarction without residual deficits: Secondary | ICD-10-CM

## 2021-01-21 DIAGNOSIS — G8220 Paraplegia, unspecified: Secondary | ICD-10-CM

## 2021-02-01 ENCOUNTER — Other Ambulatory Visit: Payer: Self-pay | Admitting: Internal Medicine

## 2021-02-03 ENCOUNTER — Encounter: Payer: Self-pay | Admitting: Internal Medicine

## 2021-02-05 ENCOUNTER — Telehealth (INDEPENDENT_AMBULATORY_CARE_PROVIDER_SITE_OTHER): Payer: PPO | Admitting: Internal Medicine

## 2021-02-05 VITALS — Wt 190.0 lb

## 2021-02-05 DIAGNOSIS — I10 Essential (primary) hypertension: Secondary | ICD-10-CM

## 2021-02-05 DIAGNOSIS — B354 Tinea corporis: Secondary | ICD-10-CM | POA: Diagnosis not present

## 2021-02-05 MED ORDER — LISINOPRIL 40 MG PO TABS: 40.0000 mg | ORAL_TABLET | Freq: Every day | ORAL | 1 refills | Status: DC

## 2021-02-05 MED ORDER — METOPROLOL TARTRATE 50 MG PO TABS: 50.0000 mg | ORAL_TABLET | Freq: Two times a day (BID) | ORAL | 1 refills | Status: DC

## 2021-02-05 MED ORDER — TERBINAFINE HCL 1 % EX CREA: 1.0000 "application " | TOPICAL_CREAM | Freq: Four times a day (QID) | CUTANEOUS | 0 refills | Status: DC

## 2021-02-05 NOTE — Progress Notes (Signed)
Virtual Visit via Video Note  I connected with Stacy Diaz on 02/05/21 at  3:00 PM EDT by a video enabled telemedicine application and verified that I am speaking with the correct person using two identifiers.  Location patient: home Location provider: work office Persons participating in the virtual visit: patient, provider  I discussed the limitations of evaluation and management by telemedicine and the availability of in person appointments. The patient expressed understanding and agreed to proceed.   HPI: She has scheduled this visit to discuss 2 issues.  #1  She was diagnosed with ringworm of her left upper arm by her home health nurse.  She has been applying Lotrimin cream daily without relief.  2.  She has been noticing elevated blood pressures.  She shares some measurements with me today:  170/93 168/87 156/94 148/74 166/99 175/88 166/86  She has been on some strange dosing of medication: Atenolol 75 mg twice daily and lisinopril of which he takes 30 mg in the morning and 20 mg at night.  She has been compliant with medication.  She has been having increased headaches.     ROS: Constitutional: Denies fever, chills, diaphoresis, appetite change and fatigue.  HEENT: Denies photophobia, eye pain, redness, hearing loss, ear pain, congestion, sore throat, rhinorrhea, sneezing, mouth sores, trouble swallowing, neck pain, neck stiffness and tinnitus.   Respiratory: Denies SOB, DOE, cough, chest tightness,  and wheezing.   Cardiovascular: Denies chest pain, palpitations and leg swelling.  Gastrointestinal: Denies nausea, vomiting, abdominal pain, diarrhea, constipation, blood in stool and abdominal distention.  Genitourinary: Denies dysuria, urgency, frequency, hematuria, flank pain and difficulty urinating.  Endocrine: Denies: hot or cold intolerance, sweats, changes in hair or nails, polyuria, polydipsia. Musculoskeletal: Denies myalgias, back pain, joint swelling,  arthralgias and gait problem.  Skin: Denies pallor, rash and wound.  Neurological: Denies dizziness, seizures, syncope, weakness, light-headedness, numbness . Hematological: Denies adenopathy. Easy bruising, personal or family bleeding history  Psychiatric/Behavioral: Denies suicidal ideation, mood changes, confusion, nervousness, sleep disturbance and agitation   Past Medical History:  Diagnosis Date   Anxiety    Arthritis    "probably" (08/25/2012)   Breast cancer of lower-outer quadrant of left female breast ([place]) 05/25/2014   Headache(784.0)    "not real frequent" (08/25/2012)   Heart disease    HTN (hypertension)    Hyperlipidemia    Meningioma ([place]) 1999   Myocardial infarction ([place]) 1987   Paresis of lower extremity ([place]) 1999   BLE/notes 08/25/2012   S/P radiation therapy 07/30/14-08/30/13   left breast cancer/50Gy   Seizures ([place])    no seizures since 2014   Spastic paraparesis    S/P meningioma resection 1999/notes 08/25/2012   Stroke ([place])    x 2, the last 2014    Past Surgical History:  Procedure Laterality Date   BRAIN MENINGIOMA EXCISION  05/1997   BREAST LUMPECTOMY WITH NEEDLE LOCALIZATION AND AXILLARY SENTINEL LYMPH NODE BX Left 06/22/2014   Procedure: LEFT [place];  Surgeon: Autumn Messing III, MD;  Location: [facility];  Service: General;  Laterality: Left;   [place]   denied intervention: "blockage wasn't bad enough"   TIBIA IM NAIL INSERTION Right 04/26/2018   Procedure: INTRAMEDULLARY (IM) NAIL TIBIAL;  Surgeon: Altamese Pratt, MD;  Location: [facility];  Service: Orthopedics;  Laterality: Right;    Family History  Problem Relation Age of Onset   Breast cancer Mother  61       unilateral   Heart disease Father    Breast cancer Paternal Grandmother 3       unilateral    SOCIAL HX:   reports that she has quit smoking. Her smoking use included cigarettes. She has a 20.00  pack-year smoking history. She has never used smokeless tobacco. She reports that she does not drink alcohol and does not use drugs.   Current Outpatient Medications:    atorvastatin (LIPITOR) 10 MG tablet, TAKE 1 TABLET (10 MG TOTAL) BY MOUTH DAILY AT 6 PM., Disp: 90 tablet, Rfl: 1   baclofen (LIORESAL) 20 MG tablet, TAKE 1 TABLET BY MOUTH 4 TIMES A DAY, Disp: 360 tablet, Rfl: 1   Calcium Carb-Cholecalciferol (CALCIUM CARBONATE-VITAMIN D3 PO), Take by mouth. 600mg / 2500 IU, patient takes 2 tablets daily, Disp: , Rfl:    clopidogrel (PLAVIX) 75 MG tablet, TAKE 1 TABLET BY MOUTH EVERY DAY, Disp: 90 tablet, Rfl: 1   diazepam (VALIUM) 5 MG tablet, Take 1 tablet (5 mg total) by mouth daily. Take 1 and a half tablet in the morning and 1 tablet at bedtime., Disp: 75 tablet, Rfl: 2   lisinopril (ZESTRIL) 40 MG tablet, Take 1 tablet (40 mg total) by mouth daily., Disp: 90 tablet, Rfl: 1   metoprolol tartrate (LOPRESSOR) 50 MG tablet, Take 1 tablet (50 mg total) by mouth 2 (two) times daily., Disp: 180 tablet, Rfl: 1   Multiple Vitamin (MULTIVITAMIN WITH MINERALS) TABS tablet, Take 1 tablet by mouth every morning. , Disp: , Rfl:    OVER THE COUNTER MEDICATION, Apply 1 application topically daily as needed (pain). Aspercreme with 5% lidocaine, Disp: , Rfl:    Oxcarbazepine (TRILEPTAL) 300 MG tablet, Take 1 tablet (300 mg total) by mouth 2 (two) times daily., Disp: 180 tablet, Rfl: 1   oxyCODONE-acetaminophen (PERCOCET) 10-325 MG tablet, Take 1 tablet by mouth every 8 (eight) hours as needed for pain., Disp: 270 tablet, Rfl: 0   polyethylene glycol (MIRALAX / GLYCOLAX) packet, Take 17 g by mouth daily as needed (for constipation). Mix with liquid and drink, Disp: , Rfl:    terbinafine (LAMISIL) 1 % cream, Apply 1 application topically in the morning, at noon, in the evening, and at bedtime., Disp: 30 g, Rfl: 0   tiZANidine (ZANAFLEX) 2 MG tablet, Take 1 tablet (2 mg total) by mouth 2 (two) times daily., Disp:  180 tablet, Rfl: 1   zinc oxide (BALMEX) 11.3 % CREA cream, Apply 1 application topically at bedtime as needed (IRRITATION). , Disp: , Rfl:   EXAM:   VITALS per patient if applicable: Blood pressure measurements as above  GENERAL: alert, oriented, appears well and in no acute distress  HEENT: atraumatic, conjunttiva clear, no obvious abnormalities on inspection of external nose and ears  NECK: normal movements of the head and neck  LUNGS: on inspection no signs of respiratory distress, breathing rate appears normal, no obvious gross increased work of breathing, gasping or wheezing  CV: no obvious cyanosis  MS: moves all visible extremities without noticeable abnormality  PSYCH/NEURO: pleasant and cooperative, no obvious depression or anxiety, speech and thought processing grossly intact  ASSESSMENT AND PLAN:   Ringworm of body  - Plan: terbinafine (LAMISIL) 1 % cream 4 times daily.  Essential hypertension  - Plan: lisinopril (ZESTRIL) 40 MG tablet, metoprolol tartrate (LOPRESSOR) 50 MG tablet -I will have her discontinue current doses of medications.  I will have her simplify her lisinopril regiment to 40  mg daily. -Discontinue atenolol and start metoprolol 50 mg twice daily. -She will follow-up with me in 4 weeks.  Time spent: 30 minutes reviewing chart, interviewing patient and husband, determining plan of care.     I discussed the assessment and treatment plan with the patient. The patient was provided an opportunity to ask questions and all were answered. The patient agreed with the plan and demonstrated an understanding of the instructions.   The patient was advised to call back or seek an in-person evaluation if the symptoms worsen or if the condition fails to improve as anticipated.    Lelon Frohlich, MD   Primary Care at [facility]

## 2021-02-19 ENCOUNTER — Other Ambulatory Visit: Payer: Self-pay | Admitting: Internal Medicine

## 2021-02-19 DIAGNOSIS — B354 Tinea corporis: Secondary | ICD-10-CM

## 2021-03-03 ENCOUNTER — Other Ambulatory Visit: Payer: Self-pay | Admitting: Internal Medicine

## 2021-03-03 DIAGNOSIS — R569 Unspecified convulsions: Secondary | ICD-10-CM

## 2021-03-06 ENCOUNTER — Encounter: Payer: Self-pay | Admitting: Internal Medicine

## 2021-03-06 DIAGNOSIS — G8220 Paraplegia, unspecified: Secondary | ICD-10-CM

## 2021-03-06 MED ORDER — OXYCODONE-ACETAMINOPHEN 10-325 MG PO TABS: 1.0000 | ORAL_TABLET | Freq: Three times a day (TID) | ORAL | 0 refills | Status: DC | PRN

## 2021-03-12 ENCOUNTER — Encounter: Payer: Self-pay | Admitting: Internal Medicine

## 2021-03-12 ENCOUNTER — Telehealth (INDEPENDENT_AMBULATORY_CARE_PROVIDER_SITE_OTHER): Payer: PPO | Admitting: Internal Medicine

## 2021-03-12 VITALS — BP 137/72

## 2021-03-12 DIAGNOSIS — I10 Essential (primary) hypertension: Secondary | ICD-10-CM

## 2021-03-12 DIAGNOSIS — R21 Rash and other nonspecific skin eruption: Secondary | ICD-10-CM

## 2021-03-12 MED ORDER — PREDNISONE 10 MG (21) PO TBPK: ORAL_TABLET | ORAL | 0 refills | Status: DC

## 2021-03-12 NOTE — Progress Notes (Signed)
Virtual Visit via Video Note  I connected with Stacy Diaz on 03/12/21 at  2:30 PM EDT by a video enabled telemedicine application and verified that I am speaking with the correct person using two identifiers.  Location patient: home Location provider: work office Persons participating in the virtual visit: patient, provider  I discussed the limitations of evaluation and management by telemedicine and the availability of in person appointments. The patient expressed understanding and agreed to proceed.   HPI: She has scheduled this visit for 2 main reasons:  1.  This is to follow-up on her blood pressure after we made some medication changes at last visit.  She is currently on lisinopril 40 mg daily and metoprolol 50 mg twice daily.  Her blood pressure today was 137/72.  Review of ambulatory measurements show that her systolics are usually in the 130s to [id number] with diastolics in the [id number] to low 80s.  2.  She has a rash over her torso.  It started over her right shoulder.  I saw her at the end of June for this and thought it was ringworm and was prescribed antifungal cream.  This has now spread over her chest arms and upper back.  She has sent pictures but they are not very clear.  She believes they are hives, they are very pruritic.   ROS: Constitutional: Denies fever, chills, diaphoresis, appetite change and fatigue.  HEENT: Denies photophobia, eye pain, redness, hearing loss, ear pain, congestion, sore throat, rhinorrhea, sneezing, mouth sores, trouble swallowing, neck pain, neck stiffness and tinnitus.   Respiratory: Denies SOB, DOE, cough, chest tightness,  and wheezing.   Cardiovascular: Denies chest pain, palpitations and leg swelling.  Gastrointestinal: Denies nausea, vomiting, abdominal pain, diarrhea, constipation, blood in stool and abdominal distention.  Genitourinary: Denies dysuria, urgency, frequency, hematuria, flank pain and difficulty urinating.  Endocrine: Denies:  hot or cold intolerance, sweats, changes in hair or nails, polyuria, polydipsia. Musculoskeletal: Denies myalgias, back pain, joint swelling, arthralgias and gait problem.  Skin: Denies pallor and wound.  Neurological: Denies dizziness, seizures, syncope, weakness, light-headedness, numbness and headaches.  Hematological: Denies adenopathy. Easy bruising, personal or family bleeding history  Psychiatric/Behavioral: Denies suicidal ideation, mood changes, confusion, nervousness, sleep disturbance and agitation   Past Medical History:  Diagnosis Date   Anxiety    Arthritis    "probably" (08/25/2012)   Breast cancer of lower-outer quadrant of left female breast ([place]) 05/25/2014   Headache(784.0)    "not real frequent" (08/25/2012)   Heart disease    HTN (hypertension)    Hyperlipidemia    Meningioma ([place]) 1999   Myocardial infarction ([place]) 1987   Paresis of lower extremity ([place]) 1999   BLE/notes 08/25/2012   S/P radiation therapy 07/30/14-08/30/13   left breast cancer/50Gy   Seizures ([place])    no seizures since 2014   Spastic paraparesis    S/P meningioma resection 1999/notes 08/25/2012   Stroke ([place])    x 2, the last 2014    Past Surgical History:  Procedure Laterality Date   BRAIN MENINGIOMA EXCISION  05/1997   BREAST LUMPECTOMY WITH NEEDLE LOCALIZATION AND AXILLARY SENTINEL LYMPH NODE BX Left 06/22/2014   Procedure: LEFT [place];  Surgeon: Autumn Messing III, MD;  Location: [facility];  Service: General;  Laterality: Left;   [place]   denied intervention: "blockage wasn't bad enough"   TIBIA IM NAIL INSERTION Right 04/26/2018  Procedure: INTRAMEDULLARY (IM) NAIL TIBIAL;  Surgeon: Altamese Fairchance, MD;  Location: [facility];  Service: Orthopedics;  Laterality: Right;    Family History  Problem Relation Age of Onset   Breast cancer Mother 1       unilateral   Heart disease Father    Breast cancer  Paternal Grandmother 36       unilateral    SOCIAL HX:   reports that she has quit smoking. Her smoking use included cigarettes. She has a 20.00 pack-year smoking history. She has never used smokeless tobacco. She reports that she does not drink alcohol and does not use drugs.   Current Outpatient Medications:    atorvastatin (LIPITOR) 10 MG tablet, TAKE 1 TABLET (10 MG TOTAL) BY MOUTH DAILY AT 6 PM., Disp: 90 tablet, Rfl: 1   baclofen (LIORESAL) 20 MG tablet, TAKE 1 TABLET BY MOUTH 4 TIMES A DAY, Disp: 360 tablet, Rfl: 1   Calcium Carb-Cholecalciferol (CALCIUM CARBONATE-VITAMIN D3 PO), Take by mouth. '600mg'$ / 2500 IU, patient takes 2 tablets daily, Disp: , Rfl:    clopidogrel (PLAVIX) 75 MG tablet, TAKE 1 TABLET BY MOUTH EVERY DAY, Disp: 90 tablet, Rfl: 1   [employer] ATHLETES FOOT 1 % cream, APPLY 1 APPLICATION TOPICALLY IN THE MORNING, AT NOON, IN THE EVENING, AND AT BEDTIME., Disp: 30 g, Rfl: 0   diazepam (VALIUM) 5 MG tablet, Take 1 tablet (5 mg total) by mouth daily. Take 1 and a half tablet in the morning and 1 tablet at bedtime., Disp: 75 tablet, Rfl: 2   lisinopril (ZESTRIL) 40 MG tablet, Take 1 tablet (40 mg total) by mouth daily., Disp: 90 tablet, Rfl: 1   metoprolol tartrate (LOPRESSOR) 50 MG tablet, Take 1 tablet (50 mg total) by mouth 2 (two) times daily., Disp: 180 tablet, Rfl: 1   Multiple Vitamin (MULTIVITAMIN WITH MINERALS) TABS tablet, Take 1 tablet by mouth every morning. , Disp: , Rfl:    OVER THE COUNTER MEDICATION, Apply 1 application topically daily as needed (pain). Aspercreme with 5% lidocaine, Disp: , Rfl:    Oxcarbazepine (TRILEPTAL) 300 MG tablet, TAKE 1 TABLET BY MOUTH 2 TIMES DAILY., Disp: 180 tablet, Rfl: 1   oxyCODONE-acetaminophen (PERCOCET) 10-325 MG tablet, Take 1 tablet by mouth every 8 (eight) hours as needed for pain., Disp: 270 tablet, Rfl: 0   polyethylene glycol (MIRALAX / GLYCOLAX) packet, Take 17 g by mouth daily as needed (for constipation). Mix with liquid  and drink, Disp: , Rfl:    predniSONE (STERAPRED UNI-PAK 21 TAB) 10 MG (21) TBPK tablet, Take as directed, Disp: 21 tablet, Rfl: 0   tiZANidine (ZANAFLEX) 2 MG tablet, Take 1 tablet (2 mg total) by mouth 2 (two) times daily., Disp: 180 tablet, Rfl: 1   zinc oxide (BALMEX) 11.3 % CREA cream, Apply 1 application topically at bedtime as needed (IRRITATION). , Disp: , Rfl:   EXAM:   VITALS per patient if applicable: Blood pressure is 137/72  GENERAL: alert, oriented, appears well and in no acute distress  HEENT: atraumatic, conjunttiva clear, no obvious abnormalities on inspection of external nose and ears  NECK: normal movements of the head and neck  LUNGS: on inspection no signs of respiratory distress, breathing rate appears normal, no obvious gross increased work of breathing, gasping or wheezing  CV: no obvious cyanosis  MS: moves all visible extremities without noticeable abnormality  PSYCH/NEURO: pleasant and cooperative, no obvious depression or anxiety, speech and thought processing grossly intact  ASSESSMENT AND PLAN:  Essential hypertension -Blood pressure appears significantly improved since changing medications. -Have advised that we continue current medication and follow-up in another 3 months.  Rash  -Etiology remains unclear to me, pictures are not very clear. -She tried antifungal cream without relief and rash is pruritic, I think it is reasonable to attempt a steroid Dosepak to see if this helps. -If no improvement, I would consider dermatology referral for further evaluation.  Time spent: 31 minutes reviewing chart, interviewing patient and formulating plan of care   I discussed the assessment and treatment plan with the patient. The patient was provided an opportunity to ask questions and all were answered. The patient agreed with the plan and demonstrated an understanding of the instructions.   The patient was advised to call back or seek an in-person  evaluation if the symptoms worsen or if the condition fails to improve as anticipated.    Lelon Frohlich, MD  [facility] at [facility]

## 2021-03-13 ENCOUNTER — Ambulatory Visit: Payer: PPO

## 2021-03-17 ENCOUNTER — Encounter: Payer: Self-pay | Admitting: Internal Medicine

## 2021-03-18 ENCOUNTER — Encounter: Payer: Self-pay | Admitting: Internal Medicine

## 2021-03-25 ENCOUNTER — Telehealth: Payer: Self-pay | Admitting: Internal Medicine

## 2021-03-25 NOTE — Telephone Encounter (Signed)
Pt call and stated she is returning your call and want a call back. 

## 2021-03-25 NOTE — Telephone Encounter (Signed)
Spoke with patient and no covid symptoms.  Nothing further needed.

## 2021-03-26 ENCOUNTER — Ambulatory Visit (INDEPENDENT_AMBULATORY_CARE_PROVIDER_SITE_OTHER): Payer: PPO

## 2021-03-26 ENCOUNTER — Other Ambulatory Visit: Payer: Self-pay

## 2021-03-26 DIAGNOSIS — M8000XA Age-related osteoporosis with current pathological fracture, unspecified site, initial encounter for fracture: Secondary | ICD-10-CM

## 2021-03-26 MED ORDER — DENOSUMAB 60 MG/ML ~~LOC~~ SOSY
60.0000 mg | PREFILLED_SYRINGE | Freq: Once | SUBCUTANEOUS | Status: AC
  Administered 2021-03-26: 60 mg via SUBCUTANEOUS

## 2021-03-26 NOTE — Progress Notes (Signed)
Per orders of Dr. Hernandez, injection of Denosumab 60 mg/ml given by Undray Allman L Kayron Hicklin. Patient tolerated injection well.  

## 2021-03-27 ENCOUNTER — Encounter: Payer: Self-pay | Admitting: Internal Medicine

## 2021-03-27 DIAGNOSIS — R21 Rash and other nonspecific skin eruption: Secondary | ICD-10-CM

## 2021-03-30 ENCOUNTER — Other Ambulatory Visit: Payer: Self-pay | Admitting: Internal Medicine

## 2021-04-25 ENCOUNTER — Encounter (HOSPITAL_COMMUNITY): Payer: Self-pay | Admitting: Emergency Medicine

## 2021-04-25 ENCOUNTER — Emergency Department (HOSPITAL_COMMUNITY): Payer: PPO

## 2021-04-25 ENCOUNTER — Other Ambulatory Visit: Payer: Self-pay

## 2021-04-25 ENCOUNTER — Observation Stay (HOSPITAL_COMMUNITY)
Admission: EM | Admit: 2021-04-25 | Discharge: 2021-04-26 | Disposition: A | Discharge status: Home / Self Care | Payer: PPO | Attending: Family Medicine | Admitting: Family Medicine

## 2021-04-25 DIAGNOSIS — R29818 Other symptoms and signs involving the nervous system: Secondary | ICD-10-CM | POA: Diagnosis not present

## 2021-04-25 DIAGNOSIS — Z79899 Other long term (current) drug therapy: Secondary | ICD-10-CM | POA: Insufficient documentation

## 2021-04-25 DIAGNOSIS — Y900 Blood alcohol level of less than 20 mg/100 ml: Secondary | ICD-10-CM | POA: Insufficient documentation

## 2021-04-25 DIAGNOSIS — E871 Hypo-osmolality and hyponatremia: Secondary | ICD-10-CM | POA: Insufficient documentation

## 2021-04-25 DIAGNOSIS — Z20822 Contact with and (suspected) exposure to covid-19: Secondary | ICD-10-CM | POA: Insufficient documentation

## 2021-04-25 DIAGNOSIS — I10 Essential (primary) hypertension: Secondary | ICD-10-CM | POA: Insufficient documentation

## 2021-04-25 DIAGNOSIS — G459 Transient cerebral ischemic attack, unspecified: Secondary | ICD-10-CM | POA: Diagnosis not present

## 2021-04-25 DIAGNOSIS — Z87891 Personal history of nicotine dependence: Secondary | ICD-10-CM | POA: Diagnosis not present

## 2021-04-25 DIAGNOSIS — R531 Weakness: Secondary | ICD-10-CM | POA: Diagnosis not present

## 2021-04-25 DIAGNOSIS — Z85841 Personal history of malignant neoplasm of brain: Secondary | ICD-10-CM | POA: Insufficient documentation

## 2021-04-25 DIAGNOSIS — Z853 Personal history of malignant neoplasm of breast: Secondary | ICD-10-CM | POA: Diagnosis not present

## 2021-04-25 DIAGNOSIS — I63411 Cerebral infarction due to embolism of right middle cerebral artery: Secondary | ICD-10-CM | POA: Diagnosis not present

## 2021-04-25 DIAGNOSIS — I2510 Atherosclerotic heart disease of native coronary artery without angina pectoris: Secondary | ICD-10-CM | POA: Diagnosis not present

## 2021-04-25 DIAGNOSIS — R202 Paresthesia of skin: Secondary | ICD-10-CM | POA: Diagnosis not present

## 2021-04-25 DIAGNOSIS — I639 Cerebral infarction, unspecified: Secondary | ICD-10-CM | POA: Diagnosis present

## 2021-04-25 DIAGNOSIS — R29810 Facial weakness: Secondary | ICD-10-CM | POA: Diagnosis not present

## 2021-04-25 LAB — I-STAT CHEM 8, ED
BUN: 8 mg/dL (ref 8–23)
Calcium, Ion: 1.05 mmol/L — ABNORMAL LOW (ref 1.15–1.40)
Chloride: 94 mmol/L — ABNORMAL LOW (ref 98–111)
Creatinine, Ser: 0.4 mg/dL — ABNORMAL LOW (ref 0.44–1.00)
Glucose, Bld: 112 mg/dL — ABNORMAL HIGH (ref 70–99)
HCT: 46 % (ref 36.0–46.0)
Hemoglobin: 15.6 g/dL — ABNORMAL HIGH (ref 12.0–15.0)
Potassium: 4.3 mmol/L (ref 3.5–5.1)
Sodium: 125 mmol/L — ABNORMAL LOW (ref 135–145)
TCO2: 25 mmol/L (ref 22–32)

## 2021-04-25 LAB — CBC
HCT: 44.3 % (ref 36.0–46.0)
Hemoglobin: 15.2 g/dL — ABNORMAL HIGH (ref 12.0–15.0)
MCH: 31.1 pg (ref 26.0–34.0)
MCHC: 34.3 g/dL (ref 30.0–36.0)
MCV: 90.6 fL (ref 80.0–100.0)
Platelets: 214 10*3/uL (ref 150–400)
RBC: 4.89 MIL/uL (ref 3.87–5.11)
RDW: 12.8 % (ref 11.5–15.5)
WBC: 9.4 10*3/uL (ref 4.0–10.5)
nRBC: 0 % (ref 0.0–0.2)

## 2021-04-25 LAB — COMPREHENSIVE METABOLIC PANEL
ALT: 22 U/L (ref 0–44)
AST: 29 U/L (ref 15–41)
Albumin: 4.4 g/dL (ref 3.5–5.0)
Alkaline Phosphatase: 48 U/L (ref 38–126)
Anion gap: 11 (ref 5–15)
BUN: 7 mg/dL — ABNORMAL LOW (ref 8–23)
CO2: 22 mmol/L (ref 22–32)
Calcium: 9.1 mg/dL (ref 8.9–10.3)
Chloride: 92 mmol/L — ABNORMAL LOW (ref 98–111)
Creatinine, Ser: 0.54 mg/dL (ref 0.44–1.00)
GFR, Estimated: >60 mL/min (ref >60)
Glucose, Bld: 111 mg/dL — ABNORMAL HIGH (ref 70–99)
Potassium: 4.3 mmol/L (ref 3.5–5.1)
Sodium: 125 mmol/L — ABNORMAL LOW (ref 135–145)
Total Bilirubin: 0.9 mg/dL (ref 0.3–1.2)
Total Protein: 7.2 g/dL (ref 6.5–8.1)

## 2021-04-25 LAB — DIFFERENTIAL
Abs Immature Granulocytes: 0.03 10*3/uL (ref 0.00–0.07)
Basophils Absolute: 0 10*3/uL (ref 0.0–0.1)
Basophils Relative: 0 %
Eosinophils Absolute: 0.1 10*3/uL (ref 0.0–0.5)
Eosinophils Relative: 1 %
Immature Granulocytes: 0 %
Lymphocytes Relative: 20 %
Lymphs Abs: 1.9 10*3/uL (ref 0.7–4.0)
Monocytes Absolute: 0.7 10*3/uL (ref 0.1–1.0)
Monocytes Relative: 7 %
Neutro Abs: 6.7 10*3/uL (ref 1.7–7.7)
Neutrophils Relative %: 72 %

## 2021-04-25 LAB — PROTIME-INR
INR: 1 (ref 0.8–1.2)
Prothrombin Time: 12.9 seconds (ref 11.4–15.2)

## 2021-04-25 LAB — RAPID URINE DRUG SCREEN, HOSP PERFORMED
Amphetamines: NOT DETECTED
Barbiturates: NOT DETECTED
Benzodiazepines: POSITIVE — AB
Cocaine: NOT DETECTED
Opiates: NOT DETECTED
Tetrahydrocannabinol: NOT DETECTED

## 2021-04-25 LAB — URINALYSIS, ROUTINE W REFLEX MICROSCOPIC
Bilirubin Urine: NEGATIVE
Glucose, UA: NEGATIVE mg/dL
Ketones, ur: NEGATIVE mg/dL
Nitrite: POSITIVE — AB
Protein, ur: NEGATIVE mg/dL
Specific Gravity, Urine: <1.005 — ABNORMAL LOW (ref 1.005–1.030)
pH: 7 (ref 5.0–8.0)

## 2021-04-25 LAB — APTT: aPTT: 25 seconds (ref 24–36)

## 2021-04-25 LAB — URINALYSIS, MICROSCOPIC (REFLEX)

## 2021-04-25 LAB — TYPE AND SCREEN
ABO/RH(D): A POS
Antibody Screen: NEGATIVE

## 2021-04-25 LAB — RESP PANEL BY RT-PCR (FLU A&B, COVID) ARPGX2
Influenza A by PCR: NEGATIVE
Influenza B by PCR: NEGATIVE
SARS Coronavirus 2 by RT PCR: NEGATIVE

## 2021-04-25 LAB — ETHANOL: Alcohol, Ethyl (B): <10 mg/dL (ref <10)

## 2021-04-25 MED ORDER — OXYCODONE-ACETAMINOPHEN 10-325 MG PO TABS: 1.0000 | ORAL_TABLET | Freq: Three times a day (TID) | ORAL | Status: DC | PRN

## 2021-04-25 MED ORDER — ACETAMINOPHEN 325 MG PO TABS: 650.0000 mg | ORAL_TABLET | Freq: Four times a day (QID) | ORAL | Status: DC

## 2021-04-25 MED ORDER — SENNA 8.6 MG PO TABS
1.0000 | ORAL_TABLET | Freq: Every day | ORAL | Status: DC
  Administered 2021-04-25: 8.6 mg via ORAL
  Filled 2021-04-25: qty 1

## 2021-04-25 MED ORDER — IOHEXOL 350 MG/ML SOLN
80.0000 mL | Freq: Once | INTRAVENOUS | Status: AC | PRN
  Administered 2021-04-25: 80 mL via INTRAVENOUS

## 2021-04-25 MED ORDER — OXCARBAZEPINE 300 MG PO TABS
300.0000 mg | ORAL_TABLET | Freq: Two times a day (BID) | ORAL | Status: DC
  Administered 2021-04-25 – 2021-04-26 (×2): 300 mg via ORAL
  Filled 2021-04-25 (×3): qty 1

## 2021-04-25 MED ORDER — ENOXAPARIN SODIUM 40 MG/0.4ML IJ SOSY
40.0000 mg | PREFILLED_SYRINGE | INTRAMUSCULAR | Status: DC
  Administered 2021-04-25: 40 mg via SUBCUTANEOUS
  Filled 2021-04-25: qty 0.4

## 2021-04-25 MED ORDER — OXYCODONE HCL 5 MG PO TABS
5.0000 mg | ORAL_TABLET | Freq: Three times a day (TID) | ORAL | Status: DC | PRN
  Administered 2021-04-25: 5 mg via ORAL
  Filled 2021-04-25: qty 1

## 2021-04-25 MED ORDER — POLYVINYL ALCOHOL 1.4 % OP SOLN
1.0000 [drp] | Freq: Every morning | OPHTHALMIC | Status: DC
  Administered 2021-04-26: 1 [drp] via OPHTHALMIC
  Filled 2021-04-25: qty 15

## 2021-04-25 MED ORDER — ASPIRIN EC 81 MG PO TBEC
81.0000 mg | DELAYED_RELEASE_TABLET | Freq: Every day | ORAL | Status: DC
  Administered 2021-04-25 – 2021-04-26 (×2): 81 mg via ORAL
  Filled 2021-04-25 (×2): qty 1

## 2021-04-25 MED ORDER — CARBOXYMETHYLCELLULOSE SODIUM 0.5 % OP SOLN: 1.0000 [drp] | Freq: Every morning | OPHTHALMIC | Status: DC

## 2021-04-25 MED ORDER — DIAZEPAM 5 MG PO TABS
5.0000 mg | ORAL_TABLET | Freq: Every day | ORAL | Status: DC
  Administered 2021-04-25: 5 mg via ORAL
  Filled 2021-04-25: qty 1

## 2021-04-25 MED ORDER — POLYETHYLENE GLYCOL 3350 17 G PO PACK: 17.0000 g | PACK | Freq: Every day | ORAL | Status: DC | PRN

## 2021-04-25 MED ORDER — ATORVASTATIN CALCIUM 10 MG PO TABS
10.0000 mg | ORAL_TABLET | Freq: Every morning | ORAL | Status: DC
  Administered 2021-04-26: 10 mg via ORAL
  Filled 2021-04-25: qty 1

## 2021-04-25 MED ORDER — OXYCODONE HCL 5 MG PO TABS
5.0000 mg | ORAL_TABLET | Freq: Three times a day (TID) | ORAL | Status: DC | PRN
  Administered 2021-04-26: 5 mg via ORAL
  Filled 2021-04-25: qty 1

## 2021-04-25 MED ORDER — LISINOPRIL 20 MG PO TABS: 40.0000 mg | ORAL_TABLET | Freq: Every day | ORAL | Status: DC

## 2021-04-25 MED ORDER — LORATADINE 10 MG PO TABS
10.0000 mg | ORAL_TABLET | Freq: Every day | ORAL | Status: DC
  Administered 2021-04-25 – 2021-04-26 (×2): 10 mg via ORAL
  Filled 2021-04-25 (×2): qty 1

## 2021-04-25 MED ORDER — OXYCODONE-ACETAMINOPHEN 5-325 MG PO TABS
1.0000 | ORAL_TABLET | Freq: Three times a day (TID) | ORAL | Status: DC | PRN
  Administered 2021-04-25 – 2021-04-26 (×2): 1 via ORAL
  Filled 2021-04-25 (×2): qty 1

## 2021-04-25 MED ORDER — CLOPIDOGREL BISULFATE 75 MG PO TABS
75.0000 mg | ORAL_TABLET | Freq: Every day | ORAL | Status: DC
  Administered 2021-04-25 – 2021-04-26 (×2): 75 mg via ORAL
  Filled 2021-04-25 (×2): qty 1

## 2021-04-25 MED ORDER — OXYCODONE-ACETAMINOPHEN 5-325 MG PO TABS
1.0000 | ORAL_TABLET | Freq: Three times a day (TID) | ORAL | Status: DC | PRN
  Filled 2021-04-25: qty 1

## 2021-04-25 MED ORDER — DIAZEPAM 5 MG PO TABS: 5.0000 mg | ORAL_TABLET | Freq: Every evening | ORAL | Status: DC | PRN

## 2021-04-25 MED ORDER — LIDOCAINE 5 % EX PTCH: 1.0000 | MEDICATED_PATCH | Freq: Every day | CUTANEOUS | Status: DC | PRN

## 2021-04-25 MED ORDER — MELATONIN 5 MG PO TABS
5.0000 mg | ORAL_TABLET | Freq: Every day | ORAL | Status: DC
  Administered 2021-04-25: 5 mg via ORAL
  Filled 2021-04-25: qty 1

## 2021-04-25 MED ORDER — METOPROLOL TARTRATE 25 MG PO TABS: 50.0000 mg | ORAL_TABLET | Freq: Two times a day (BID) | ORAL | Status: DC

## 2021-04-25 NOTE — Consult Note (Signed)
Neurology consult   CC: code stroke.  History is obtained from: patient, chart.   HPI: Stacy Diaz is a 70 yo female with a PMHx of TIA, stroke, meningioma s/p resection, CAD, MI, seizures, left breast cancer, spastic paraparesis BLEs since brain surgery in 1999. She wears LFOs bilateral legs.  Patient was at home at dining room table today around 1300 hours when she had acute onset of facial droop and flaccid LUE x 5 minutes. Here, she complained of fuzzy vision and tingling of left face and LUE. She had a similar episode about 4 months ago.   EMS was called and patient was brought to ED. After ED MD saw patient, a code stroke was called and patient brought back to CT by staff. However, no code stroke page was sent, so neurology attendance was delayed slightly. In CT, patient's symptoms from today were resolved. Her chronic LUE weakness was there, but LUE no longer flaccid. Her speech returned to normal and her numbness/tingling resolved. Vision back to normal. No noted facial droop.   Delay for CTA head and neck due to poor vein access and restricted LUE (breast cancer). CTH showed no evidence of acute intracranial hemorrhage or infarct. CTA head and neck showed no LVO or significant stenosis. Patient unable to have MRI due to metal clips retained since craniotomy.   Patient has a history of seizures since 2014 when she had an incident of worsening LOC with blank stare for 45 minutes. EEG was negative at that time, and patient was put on Keppra which she continues to take. NP inquired if today's event resembled her seizures at all, and the husband and patient stated no. Husband stated there was no decrease in LOC, no shaking, no staring, and she could converse during episode.   Last neurology out patient f/up note on chart in 2018. Stroke 2014, TIA 2015 and 2017.   LKW: 1300 hours TNK given?: No symptoms resolved.  IR Thrombectomy?: No, no LVO.  MRS:  4                                                        NIHSS:  1a Level of Consciousness: 0 1b LOC Questions: 0 1c LOC Commands: 0 2 Best Gaze: 0 3 Visual: 0 4 Facial Palsy: 0 5a Motor Arm - left: 1 5b Motor Arm - Right: 0 6a Motor Leg - Left: 4 6b Motor Leg - Right: 4 7 Limb Ataxia: 0 8 Sensory: 0 9 Best Language: 0 10 Dysarthria: 0 11 Extinction and Inattention: 0 TOTAL: 9               Her legs are "4" chronically since brain surgery.   ROS: A robust ROS was unable to be performed due to emergent nature of event.   Past Medical History:  Diagnosis Date   Anxiety    Arthritis    "probably" (08/25/2012)   Breast cancer of lower-outer quadrant of left female breast ([place]) 05/25/2014   Headache(784.0)    "not real frequent" (08/25/2012)   Heart disease    HTN (hypertension)    Hyperlipidemia    Meningioma ([place]) 1999   Myocardial infarction ([place]) 1987   Paresis of lower extremity ([place]) 1999   BLE/notes 08/25/2012   S/P radiation therapy 07/30/14-08/30/13   left breast cancer/50Gy  Seizures ([place])    no seizures since 2014   Spastic paraparesis    S/P meningioma resection 1999/notes 08/25/2012   Stroke ([place])    x 2, the last 2014    Family History  Problem Relation Age of Onset   Breast cancer Mother 75       unilateral   Heart disease Father    Breast cancer Paternal Grandmother 56       unilateral    Social History:  reports that she has quit smoking. Her smoking use included cigarettes. She has a 20.00 pack-year smoking history. She has never used smokeless tobacco. She reports that she does not drink alcohol and does not use drugs.   Prior to Admission medications   Medication Sig Start Date End Date Taking? Authorizing Provider  atorvastatin (LIPITOR) 10 MG tablet TAKE 1 TABLET (10 MG TOTAL) BY MOUTH DAILY AT 6 PM. 03/31/21   Isaac Bliss, Rayford Halsted, MD  baclofen (LIORESAL) 20 MG tablet TAKE 1 TABLET BY MOUTH 4 TIMES A DAY 12/13/20   Isaac Bliss, Rayford Halsted, MD  Calcium Carb-Cholecalciferol  (CALCIUM CARBONATE-VITAMIN D3 PO) Take by mouth. '600mg'$ / 2500 IU, patient takes 2 tablets daily    [provider]  clopidogrel (PLAVIX) 75 MG tablet TAKE 1 TABLET BY MOUTH EVERY DAY 01/21/21   Isaac Bliss, Rayford Halsted, MD  [employer] ATHLETES FOOT 1 % cream APPLY 1 APPLICATION TOPICALLY IN THE MORNING, AT NOON, IN THE EVENING, AND AT BEDTIME. 02/19/21   Isaac Bliss, Rayford Halsted, MD  diazepam (VALIUM) 5 MG tablet Take 1 tablet (5 mg total) by mouth daily. Take 1 and a half tablet in the morning and 1 tablet at bedtime. 12/31/20   Isaac Bliss, Rayford Halsted, MD  lisinopril (ZESTRIL) 40 MG tablet Take 1 tablet (40 mg total) by mouth daily. 02/05/21   Isaac Bliss, Rayford Halsted, MD  metoprolol tartrate (LOPRESSOR) 50 MG tablet Take 1 tablet (50 mg total) by mouth 2 (two) times daily. 02/05/21   Isaac Bliss, Rayford Halsted, MD  Multiple Vitamin (MULTIVITAMIN WITH MINERALS) TABS tablet Take 1 tablet by mouth every morning.     [provider]  OVER THE COUNTER MEDICATION Apply 1 application topically daily as needed (pain). Aspercreme with 5% lidocaine    [provider]  Oxcarbazepine (TRILEPTAL) 300 MG tablet TAKE 1 TABLET BY MOUTH 2 TIMES DAILY. 03/04/21   Isaac Bliss, Rayford Halsted, MD  oxyCODONE-acetaminophen (PERCOCET) 10-325 MG tablet Take 1 tablet by mouth every 8 (eight) hours as needed for pain. 03/06/21   Isaac Bliss, Rayford Halsted, MD  polyethylene glycol [facility] / Floria Raveling) packet Take 17 g by mouth daily as needed (for constipation). Mix with liquid and drink    [provider]  predniSONE (STERAPRED UNI-PAK 21 TAB) 10 MG (21) TBPK tablet Take as directed 03/12/21   Isaac Bliss, Rayford Halsted, MD  tiZANidine (ZANAFLEX) 2 MG tablet Take 1 tablet (2 mg total) by mouth 2 (two) times daily. 07/16/20   Isaac Bliss, Rayford Halsted, MD  zinc oxide (BALMEX) 11.3 % CREA cream Apply 1 application topically at bedtime as needed (IRRITATION).     [provider]  atenolol  (TENORMIN) 50 MG tablet TAKE 1 & 1/2 TABLET BY MOUTH TWICE DAILY **NEED OFFICE VISIT 02/04/21 02/05/21  Isaac Bliss, Rayford Halsted, MD   Exam: Current vital signs: BP (!) 174/77 (BP Location: Right Arm)   Pulse 81   Temp 98.8 F (37.1 C) (Oral)   Resp 19  Ht '5\' 2"'$  (1.575 m)   Wt 88.5 kg   SpO2 100%   BMI 35.67 kg/m   Physical Exam  Constitutional: Appears well-developed and well-nourished. Obese. Psych: Affect appropriate to situation. Affect light.  Eyes: No scleral injection. HENT: No OP obstruction. Head: Normocephalic.  Cardiovascular: Normal rate and regular rhythm.  Respiratory: Effort normal.  GI: Abdomen soft.  No distension. There is no tenderness.  Skin: WDI.  Neuro: Mental Status: Patient is awake, alert, oriented to person, place, month, year, and situation. Patient is able to give a clear and coherent history. No signs of neglect. Speech/Language:  Speech is clear, fluent without dysarthria or aphasia. Repetition, naming, and comprehension intact.  Cranial Nerves: II: Visual Fields are full. Pupils are equal, round, and reactive to light.  III,IV, VI: EOMI without ptosis or diplopia.   V: Facial sensation is symmetric to light touch in V1, V2, and V3. VII: Facial movement symmetrical. No facial droop.  VIII: hearing is intact to voice. X: No hypophonia XI: Shoulder shrug is symmetric. XII: tongue is midline without atrophy or fasciculations.  Motor: RUE: grips  5/5     biceps  5/5     triceps  5/5 LUE: grips   4/5    biceps  4+/5     triceps  4+/5 BLEs are plegic.  Sensory: Sensation is symmetric to light touch in all four extremities. No extinction to DSS.  Plantars: Toes are downgoing bilaterally.  Cerebellar: No ataxia noted with FNF. Unable to perform HKS.    I have reviewed labs in epic and the pertinent results are: INR 1.0     aPTT  25     creatinine .80      Na 125  MD reviewed the images obtained:  NCT head  No evidence of acute  intracranial hemorrhage or infarct. ASPECTS is 10 Stable postsurgical changes and encephalomalacia in the bilateral frontal and parietal lobes as above.  CTA head and neck  No large vessel occlusion or hemodynamically significant stenosis.  Assessment: 70 yo with stroke risk factors of prior stroke, prior TIAs, HTN, HLD, and CAD. Patient presented with stroke symptoms as above, which resolved in the CT scanner. Imaging was negative. Unable to do MRI due to surgical clips retained from remote meningioma resection. She does have a history of seizures, but today's event did not resemble her normal seizure presentation. Her Na is low, but on review of labs, it has been chronically low on previous checks.   Impression:  -TIA -history of stroke and TIAs. -history of seizures since 2014.  -s/p meningioma resection in 1999.  -spastic paresis of LEs since meningioma surgery.   Plan: - Medicine admit.  - Recommend TTE. - Recommend labs: HbA1c, lipid panel, TSH. - Recommend Statin or increased dose if LDL > 70 - Aspirin '81mg'$  daily. - Clopidogrel '75mg'$  daily. - SBP goal - Permissive hypertension first 24 h < 220/110. Hold home medications for now. - Telemetry monitoring for arrhythmia. - bedside Swallow screen. - Stroke education. - PT/OT/SLP consult. - NIHSS as per protocol. - frequent neuro checks.  -stroke team to follow.   Patient seen by Clance Boll, MSN, APN-BC, nurse practitioner   Pager: [phone]   I have seen and examined the patient. I have discussed the assessment and plan with the Neurology NP and made amendations as indicated. 70 year old female with multiple stroke risk factors presenting with worsened LUE weakness relative to her chronic LUE weakness from prior  stroke, left facial droop, tingling of left face and LUE, as well as fuzzy vision. Symptoms were transient and had resolved by the time of CT. Plan is for stroke work up.  Electronically signed: Dr. Kerney Elbe

## 2021-04-25 NOTE — H&P (Addendum)
[facility] Admission History and Physical Service Pager: [phone]  Patient name: Stacy Diaz Medical record number: [id number] Date of birth: 1951/01/04 Age: 70 y.o. Gender: female  Primary Care Provider: Isaac Bliss, Rayford Halsted, MD Consultants: Neurology Code Status: Full Preferred Emergency Contact: Husband Contact Information     Name Relation Home Work [place] Spouse [phone]  [phone]      Chief Complaint: L sided weakness  Assessment and Plan: Stacy Diaz is a 70 y.o. female presenting with L sided weakness . PMH is significant for HTN, HLD, CAD, MI, Breast Cancer, CVA, meningioma and seizures.  L sided weakness with facial droop (resolved) Patient presented with left-sided weakness and facial drooping that started earlier this afternoon at 1300. This resolved completely about an hour after onset. Labs notable on admission include Na 125 and glucose 111. CT head notable for no evidence of acute intracranial hemorrhage or infarct, stable postsurgical changes and encephalomalacia in the bilateral frontal and parietal lobes. CTA head and neck demonstrate no large vessel occlusion or hemodynamically significant stenosis. Unable to retrieve an MRI due to metal clips retained since craniotomy which was for resection of meningioma. EKG notable for NSR, PVCs present with no acute ST changes, UDS + benzodiazapine (on valium at home).  Patient has had multiple TIAs in the past and has had similar symptoms.  She also has multiple drug risk factors and her past medical history.  This could likely be the cause of her now resolved facial droop and weakness.  Patient does have a seizure disorder but has not had a seizure in several years and is well controlled on medication as described below.  She also reports that the symptoms are not similar to her previous seizures, so an acute seizure being the cause of this is less likely. -Admit  to [facility], attending Dr. Ardelia Mems  -Neurology consulted, stroke team to follow, appreciate recommendations -aspirin and plavix daily per neuro -f/u risk stratification labs: A1c, lipid panel, TSH -f/u UA -f/u am CMP and CBC -permissive hypertension window for 24 hrs <220/110 -PT/OT -SLP -up with assistance  -consider TTE -neuro checks q4h -continuous cardiac telemetry  -bedside swallow completed-diet ordered  Hyponatremia On admission of Na 125>125. Although patient seems to be hyponatremic at baseline but not to this extent, sodium in June 2022 was 130. Does not appear to demonstrate volume overload on exam. She is not exhibiting confusion, lethargy, seizures, nausea, vomiting, or HA or any other symptoms of symptomatic hyponatremia. She seems euvolemic on exam.  Patient is also currently taking oxcarbazepine for her seizure disorder which could be the cause of her hyponatremia.  Per up-to-date 22% of patients taking oxcarbazepine can have sodium level less than 129. -AM CMP -Continue to monitor  Hypertension BP noted to be 174/77 on admission. Home medications include lisinopril and metoprolol.  -will hold home meds given permissive window -Plan restart home antihypertensives after 24 hours  HLD Most recent lipid panel unremarkable from last year. Home medication includes atorvastatin 10 mg daily. -continue statin home dose, consider increasing dose to high intensity statin with history of CVA and patient reported MI at age 9.  Seizure disorder  Pt has hx of seizures since 2014. Last seizure was several years ago and pt has been signed off from neurology. Compliant on oxcarbazepine.  -seizure precautions -Continue with home oxcarbazepine   MI at age 1 Patient reports that she smoked 2 packs a day from the age of [age].  She states that at age 89 she had a "heart attack".  She states that she was hospitalized and at that time she received a cath but no stent.  She states that she  was followed by cardiology for some time after this.  Hx of CVA Patient is on Plavix at home and is very compliant with her medications. Home med: Plavix '75mg'$  1xdaily  -Continue home dose   Breast Cancer s/p radiation and lumpectomy  Patient had breast cancer 6 years ago but has been in remission since radiation and lumpectomy.  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  She is completely immobile and uses a mobility device to get around, as well as has a caretaker at her home.  She is on multiple home medications to control her lower extremity pain. Home Meds: Percocet 10-'325mg'$  3 times daily, Baclofen 20 mg 4xdaily, lidocaine cream, and valium 1 tablet nightly '5mg'$ .   -Continue with home percocet and valium, lidocaine cream.  We do not have Percocet 10-325's so will give Percocet 5-325 and oxycodone 5 mg every 8 hours PRN per pharmacy for a combination of 10 mg oxycodone and 325 mg Tylenol.  This will be equivalent to her home medication.  I do recommend discussion about trying to wean down her narcotic prior to discharge. -Holding baclofen   Medication Management Pt is on Valium '5mg'$  one nightly which is on [place] criteria. Additionally, she is taking percocet and baclofen daily as mentioned above. This may not be appropriate for her and she could possible benefit from changes in home medications at discharge. According to PDMP, patient received 75 Valium tablets in the last 30 days, however patient reports she is only taking 1 tablet nightly. PDMP has been reviewed today.  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 She takes miralax at home.  Last bowel movement yesterday. -Continue with bowel regimen of miralax. -Will add senna as patient's  constipation is due to opioid use and this may provide additional assistance.  FEN/GI: Heart Healthy  Prophylaxis: Lovenox   Disposition: admit to cardiac telemetry, attending Dr. Ardelia Mems   History of Present Illness:  LUJAYN CHEERS is a 70 y.o. female presenting with HTN, HLD, Breast Cancer, CVA, h/o MI, meningioma s/p resection, paraparesis LE, and seizures. Today she got up and went about her day but noticed her blood pressure was 160/91 via her caretaker. She continued about her day and around 12-1pm noticed her left arm started going numb. Her husband noticed a slight facial droop on the left side of her face. She was unable to pick up her left arm in addition to the numbness. These symptoms lasted for approximately one hour before completely dissipating. She states she is non-mobile from the waist down secondary to resection of a meningioma performed in 1999 that left her "paralyzed."  She can feel her legs but cannot move her legs at baseline.    She states she did have something similar to this in the past. She also had a stroke in the 90s. She states her previous TIAs tend to only last about 5 minutes, but she had her husband call 911 because this one lasted about an hour.  The pt has a seizure history. No seizures for several years.  Patient was followed by  neurology until about 4 years ago where they signed off.  She is on oxcarbazepine and states she has not had a seizure for very long time.  She endorses that she chain smoked in college and had a heart attack at age 52yo, she had a cath but no stent placed.  Has history of breast cancer with lumpectomy and radiation, has been clear for 6 years.  Alcohol: None Cigarettes: 2 packs per day from age [age]yo. No smoking since then.  Drugs: None  Review Of Systems: Per HPI with the following additions:   Review of Systems  Constitutional:  Negative for fever.  HENT:  Negative for congestion.   Cardiovascular:  Negative for chest  pain.  Gastrointestinal:  Positive for constipation. Negative for abdominal distention, diarrhea, nausea and vomiting.  Genitourinary:  Negative for dysuria.       Incontinent at baseline - uses exterior cath  Neurological:  Negative for dizziness.    Patient Active Problem List   Diagnosis Date Noted   Stroke [facility]) 04/25/2021   Osteoporosis 09/02/2018   Paraplegia ([place]) 07/26/2018   Urinary tract infection 04/28/2018   Closed fracture of right tibia and fibula 04/26/2018   Acute right-sided weakness 06/02/2016   History of resection of meningioma 06/02/2016   Family history of breast cancer 06/07/2014   Breast cancer of lower-outer quadrant of left female breast ([place]) 05/25/2014   Partial symptomatic epilepsy with complex partial seizures, not intractable, without status epilepticus ([place]) 04/29/2014   History of stroke 04/29/2014   Recent UTI (urinary tract infection) 08/26/2012   Paralysis of upper limb ([place]) 08/25/2012   BENIGN NEOPLASM OF BRAIN 03/31/2010   INJURY, NERVE, PELVIS/LWR LIMB NOS 05/20/2007   Hyperlipidemia 05/12/2007   Essential hypertension 05/12/2007   PREMATURE VENTRICULAR CONTRACTIONS 05/12/2007    Past Medical History: Past Medical History:  Diagnosis Date   Anxiety    Arthritis    "probably" (08/25/2012)   Breast cancer of lower-outer quadrant of left female breast ([place]) 05/25/2014   Headache(784.0)    "not real frequent" (08/25/2012)   Heart disease    HTN (hypertension)    Hyperlipidemia    Meningioma ([place]) 1999   Myocardial infarction ([place]) 1987   Paresis of lower extremity ([place]) 1999   BLE/notes 08/25/2012   S/P radiation therapy 07/30/14-08/30/13   left breast cancer/50Gy   Seizures ([place])    no seizures since 2014   Spastic paraparesis    S/P meningioma resection 1999/notes 08/25/2012   Stroke ([place])    x 2, the last 2014    Past Surgical History: Past Surgical History:  Procedure Laterality Date   BRAIN MENINGIOMA EXCISION  05/1997   BREAST  LUMPECTOMY WITH NEEDLE LOCALIZATION AND AXILLARY SENTINEL LYMPH NODE BX Left 06/22/2014   Procedure: LEFT [place];  Surgeon: Autumn Messing III, MD;  Location: [facility];  Service: General;  Laterality: Left;   [facility]   denied intervention: "blockage wasn't bad enough"   TIBIA IM NAIL INSERTION Right 04/26/2018   Procedure: INTRAMEDULLARY (IM) NAIL TIBIAL;  Surgeon: Altamese [place]rito, MD;  Location: [facility];  Service: Orthopedics;  Laterality: Right;    Social History: Social History   Tobacco Use   Smoking status: Former    Packs/day: 2.00    Years: 10.00    Pack years: 20.00    Types: Cigarettes   Smokeless tobacco: Never   Tobacco comments:    08/25/2012 "quit  smoking cigarettes in 1985 when I had my heart attack"  Vaping Use   Vaping Use: Never used  Substance Use Topics   Alcohol use: No    Alcohol/week: 7.0 standard drinks    Types: 7 Glasses of wine per week    Comment: occasionally   Drug use: No   Please also refer to relevant sections of EMR.  Family History: Family History  Problem Relation Age of Onset   Breast cancer Mother 74       unilateral   Heart disease Father    Breast cancer Paternal Grandmother 46       unilateral     Allergies and Medications: No Known Allergies No current facility-administered medications on file prior to encounter.   Current Outpatient Medications on File Prior to Encounter  Medication Sig Dispense Refill   ASPERCREME LIDOCAINE EX Apply 1 application topically daily as needed (muscle pain).     atorvastatin (LIPITOR) 10 MG tablet TAKE 1 TABLET (10 MG TOTAL) BY MOUTH DAILY AT 6 PM. (Patient taking differently: Take 10 mg by mouth every morning.) 90 tablet 1   baclofen (LIORESAL) 20 MG tablet TAKE 1 TABLET BY MOUTH 4 TIMES A DAY (Patient taking differently: Take 20 mg by mouth 4 (four) times daily.) 360 tablet 1   Calcium Carb-Cholecalciferol  (CALCIUM CARBONATE-VITAMIN D3 PO) Take 1 tablet by mouth daily after lunch. '600mg'$ / 2500 IU, patient takes 2 tablets daily     carboxymethylcellulose (REFRESH PLUS) 0.5 % SOLN Place 1 drop into both eyes every morning.     cetirizine (ZYRTEC) 10 MG tablet Take 10 mg by mouth every morning.     clopidogrel (PLAVIX) 75 MG tablet TAKE 1 TABLET BY MOUTH EVERY DAY (Patient taking differently: Take 75 mg by mouth every morning.) 90 tablet 1   diazepam (VALIUM) 5 MG tablet Take 1 tablet (5 mg total) by mouth daily. Take 1 and a half tablet in the morning and 1 tablet at bedtime. (Patient taking differently: Take 5 mg by mouth at bedtime.) 75 tablet 2   lisinopril (ZESTRIL) 40 MG tablet Take 1 tablet (40 mg total) by mouth daily. 90 tablet 1   liver oil-zinc oxide (DESITIN) 40 % ointment Apply 1 application topically at bedtime. Apply to buttocks     melatonin 5 MG TABS Take 5 mg by mouth at bedtime.     metoprolol tartrate (LOPRESSOR) 50 MG tablet Take 1 tablet (50 mg total) by mouth 2 (two) times daily. 180 tablet 1   Multiple Vitamin (MULTIVITAMIN WITH MINERALS) TABS tablet Take 1 tablet by mouth every morning.      OVER THE COUNTER MEDICATION Apply 1 application topically 2 (two) times daily. Over the counter anti-itch cream     Oxcarbazepine (TRILEPTAL) 300 MG tablet TAKE 1 TABLET BY MOUTH 2 TIMES DAILY. (Patient taking differently: Take 300 mg by mouth 2 (two) times daily.) 180 tablet 1   oxyCODONE-acetaminophen (PERCOCET) 10-325 MG tablet Take 1 tablet by mouth every 8 (eight) hours as needed for pain. (Patient taking differently: Take 1 tablet by mouth every 8 (eight) hours. scheduled) 270 tablet 0   polyethylene glycol (MIRALAX / GLYCOLAX) packet Take 17 g by mouth daily as needed (for constipation). Mix with liquid and drink     [employer] ATHLETES FOOT 1 % cream APPLY 1 APPLICATION TOPICALLY IN THE MORNING, AT NOON, IN THE EVENING, AND AT BEDTIME. (Patient not taking: No sig reported) 30 g 0   tiZANidine  (ZANAFLEX) 2 MG  tablet Take 1 tablet (2 mg total) by mouth 2 (two) times daily. (Patient not taking: No sig reported) 180 tablet 1   [DISCONTINUED] atenolol (TENORMIN) 50 MG tablet TAKE 1 & 1/2 TABLET BY MOUTH TWICE DAILY **NEED OFFICE VISIT 270 tablet 1    Objective: BP (!) 144/100   Pulse 97   Temp 97.9 F (36.6 C) (Oral)   Resp 20   Ht '5\' 2"'$  (1.575 m)   Wt 88.5 kg   SpO2 97%   BMI 35.67 kg/m  Physical Exam Vitals reviewed.  Constitutional:      General: She is not in acute distress.    Appearance: She is not ill-appearing or toxic-appearing.     Comments: Elderly woman resting in bed  HENT:     Nose: Nose normal.     Mouth/Throat:     Mouth: Mucous membranes are moist.  Eyes:     Extraocular Movements: Extraocular movements intact.     Pupils: Pupils are equal, round, and reactive to light.  Cardiovascular:     Rate and Rhythm: Normal rate and regular rhythm.  Pulmonary:     Effort: Pulmonary effort is normal. No respiratory distress.     Breath sounds: Normal breath sounds. No wheezing.  Abdominal:     General: There is no distension.     Palpations: Abdomen is soft.     Tenderness: There is no abdominal tenderness.     Comments: BS normal  Skin:    General: Skin is warm and dry.  Neurological:     Mental Status: She is alert. Mental status is at baseline.     Cranial Nerves: No cranial nerve deficit.     Sensory: No sensory deficit.     Motor: No weakness.     Comments: AxOx4.  CN II through XII intact.  Pt has baseline paraparesis in BLE. She cannot move her lower extremities but has intact sensation bilateral. BUE strength 5/5. No drooping of face.   Psychiatric:        Mood and Affect: Mood normal.        Behavior: Behavior normal.        Thought Content: Thought content normal.     Labs and Imaging: CBC BMET  Recent Labs  Lab 04/25/21 1610 04/25/21 1618  WBC 9.4  --   HGB 15.2* 15.6*  HCT 44.3 46.0  PLT 214  --    Recent Labs  Lab  04/25/21 1610 04/25/21 1618  NA 125* 125*  K 4.3 4.3  CL 92* 94*  CO2 22  --   BUN 7* 8  CREATININE 0.54 0.40*  GLUCOSE 111* 112*  CALCIUM 9.1  --      EKG: My own interpretation (not copied from electronic read): NSR, PVCs present with no acute ST changes   Erskine Emery, MD 04/25/2021, 9:17 PM PGY-1, [place] Intern pager: [phone], text pages welcome  Upper Level Addendum:  I have seen and evaluated this patient along with Dr. Zigmund Daniel and reviewed the above note, making necessary revisions as appropriate.  I agree with the medical decision making and physical exam as noted above.  Lurline Del, DO PGY-3 [facility]

## 2021-04-25 NOTE — ED Notes (Signed)
Admitting team at bedside.

## 2021-04-25 NOTE — ED Notes (Signed)
Full linen change

## 2021-04-25 NOTE — ED Provider Notes (Signed)
[facility] EMERGENCY DEPARTMENT Provider Note   CSN: [id number] Arrival date & time: 04/25/21  1500     History Chief Complaint  Patient presents with   Numbness    Pt arrived via [facility] with c/c of stroke like symptoms. Per EMS pt had acute onset of left side facial droop and flaccid left side that lasted about 5 mins. Hx of TIAs & brain surgery. Pt can't have MRI due to mesh). Pt has complaints currently of left side tingling and numbness.   150/90, 70HR, 116 CBG, 100 RA     Stacy Diaz is a 70 y.o. female.  70 yo female with history of recurrent TIA, meningioma, CVA  presents to ED for complaint of left sided weakness, vision changes, tingling to LUE and left side of face. Onset of symptoms around 1-1:30 PM today. No recent medication changes, no recent falls. No thinners. Last similar episode was around 3-4 mos ago. Follows with neuro as o/p. Chronic LE weakness.    The history is provided by the patient. No language interpreter was used.  Cerebrovascular Accident This is a recurrent problem. Episode onset: 2-3 hrs ago. Pertinent negatives include no chest pain, no abdominal pain, no headaches and no shortness of breath.      Past Medical History:  Diagnosis Date   Anxiety    Arthritis    "probably" (08/25/2012)   Breast cancer of lower-outer quadrant of left female breast ([place]) 05/25/2014   Headache(784.0)    "not real frequent" (08/25/2012)   Heart disease    HTN (hypertension)    Hyperlipidemia    Meningioma ([place]) 1999   Myocardial infarction ([place]) 1987   Paresis of lower extremity ([place]) 1999   BLE/notes 08/25/2012   S/P radiation therapy 07/30/14-08/30/13   left breast cancer/50Gy   Seizures ([place])    no seizures since 2014   Spastic paraparesis    S/P meningioma resection 1999/notes 08/25/2012   Stroke ([place])    x 2, the last 2014    Patient Active Problem List   Diagnosis Date Noted   Stroke ([place]) 04/25/2021   Osteoporosis 09/02/2018    Paraplegia ([place]) 07/26/2018   Urinary tract infection 04/28/2018   Closed fracture of right tibia and fibula 04/26/2018   Acute right-sided weakness 06/02/2016   History of resection of meningioma 06/02/2016   Family history of breast cancer 06/07/2014   Breast cancer of lower-outer quadrant of left female breast ([place]) 05/25/2014   Partial symptomatic epilepsy with complex partial seizures, not intractable, without status epilepticus ([place]) 04/29/2014   History of stroke 04/29/2014   Recent UTI (urinary tract infection) 08/26/2012   Paralysis of upper limb ([place]) 08/25/2012   BENIGN NEOPLASM OF BRAIN 03/31/2010   INJURY, NERVE, PELVIS/LWR LIMB NOS 05/20/2007   Hyperlipidemia 05/12/2007   Essential hypertension 05/12/2007   PREMATURE VENTRICULAR CONTRACTIONS 05/12/2007    Past Surgical History:  Procedure Laterality Date   BRAIN MENINGIOMA EXCISION  05/1997   BREAST LUMPECTOMY WITH NEEDLE LOCALIZATION AND AXILLARY SENTINEL LYMPH NODE BX Left 06/22/2014   Procedure: LEFT [place];  Surgeon: Autumn Messing III, MD;  Location: [facility];  Service: General;  Laterality: Left;   [place]   denied intervention: "blockage wasn't bad enough"   TIBIA IM NAIL INSERTION Right 04/26/2018   Procedure: INTRAMEDULLARY (IM) NAIL TIBIAL;  Surgeon: Altamese Palmyra, MD;  Location: [facility];  Service: Orthopedics;  Laterality: Right;  OB History   No obstetric history on file.     Family History  Problem Relation Age of Onset   Breast cancer Mother 43       unilateral   Heart disease Father    Breast cancer Paternal Grandmother 63       unilateral    Social History   Tobacco Use   Smoking status: Former    Packs/day: 2.00    Years: 10.00    Pack years: 20.00    Types: Cigarettes   Smokeless tobacco: Never   Tobacco comments:    08/25/2012 "quit smoking cigarettes in 1985 when I had my heart attack"  Vaping  Use   Vaping Use: Never used  Substance Use Topics   Alcohol use: No    Alcohol/week: 7.0 standard drinks    Types: 7 Glasses of wine per week    Comment: occasionally   Drug use: No    Home Medications Prior to Admission medications   Medication Sig Start Date End Date Taking? Authorizing Provider  ASPERCREME LIDOCAINE EX Apply 1 application topically daily as needed (muscle pain).   Yes [provider]  atorvastatin (LIPITOR) 10 MG tablet TAKE 1 TABLET (10 MG TOTAL) BY MOUTH DAILY AT 6 PM. Patient taking differently: Take 10 mg by mouth every morning. 03/31/21  Yes Isaac Bliss, Rayford Halsted, MD  baclofen (LIORESAL) 20 MG tablet TAKE 1 TABLET BY MOUTH 4 TIMES A DAY Patient taking differently: Take 20 mg by mouth 4 (four) times daily. 12/13/20  Yes Isaac Bliss, Rayford Halsted, MD  Calcium Carb-Cholecalciferol (CALCIUM CARBONATE-VITAMIN D3 PO) Take 1 tablet by mouth daily after lunch. '600mg'$ / 2500 IU, patient takes 2 tablets daily   Yes [provider]  carboxymethylcellulose (REFRESH PLUS) 0.5 % SOLN Place 1 drop into both eyes every morning.   Yes [provider]  cetirizine (ZYRTEC) 10 MG tablet Take 10 mg by mouth every morning.   Yes [provider]  clopidogrel (PLAVIX) 75 MG tablet TAKE 1 TABLET BY MOUTH EVERY DAY Patient taking differently: Take 75 mg by mouth every morning. 01/21/21  Yes Isaac Bliss, Rayford Halsted, MD  diazepam (VALIUM) 5 MG tablet Take 1 tablet (5 mg total) by mouth daily. Take 1 and a half tablet in the morning and 1 tablet at bedtime. Patient taking differently: Take 5 mg by mouth at bedtime. 12/31/20  Yes Isaac Bliss, Rayford Halsted, MD  lisinopril (ZESTRIL) 40 MG tablet Take 1 tablet (40 mg total) by mouth daily. 02/05/21  Yes Isaac Bliss, Rayford Halsted, MD  liver oil-zinc oxide (DESITIN) 40 % ointment Apply 1 application topically at bedtime. Apply to buttocks   Yes [provider]  melatonin 5 MG TABS Take 5 mg by mouth  at bedtime.   Yes [provider]  metoprolol tartrate (LOPRESSOR) 50 MG tablet Take 1 tablet (50 mg total) by mouth 2 (two) times daily. 02/05/21  Yes Isaac Bliss, Rayford Halsted, MD  Multiple Vitamin (MULTIVITAMIN WITH MINERALS) TABS tablet Take 1 tablet by mouth every morning.    Yes [provider]  OVER THE COUNTER MEDICATION Apply 1 application topically 2 (two) times daily. Over the counter anti-itch cream   Yes [provider]  Oxcarbazepine (TRILEPTAL) 300 MG tablet TAKE 1 TABLET BY MOUTH 2 TIMES DAILY. Patient taking differently: Take 300 mg by mouth 2 (two) times daily. 03/04/21  Yes Isaac Bliss, Rayford Halsted, MD  oxyCODONE-acetaminophen (PERCOCET) 10-325 MG tablet Take 1 tablet by mouth every  8 (eight) hours as needed for pain. Patient taking differently: Take 1 tablet by mouth every 8 (eight) hours. scheduled 03/06/21  Yes Isaac Bliss, Rayford Halsted, MD  polyethylene glycol [facility] / Floria Raveling) packet Take 17 g by mouth daily as needed (for constipation). Mix with liquid and drink   Yes [provider]  [employer] ATHLETES FOOT 1 % cream APPLY 1 APPLICATION TOPICALLY IN THE MORNING, AT NOON, IN THE EVENING, AND AT BEDTIME. Patient not taking: No sig reported 02/19/21   Isaac Bliss, Rayford Halsted, MD  tiZANidine (ZANAFLEX) 2 MG tablet Take 1 tablet (2 mg total) by mouth 2 (two) times daily. Patient not taking: No sig reported 07/16/20   Isaac Bliss, Rayford Halsted, MD  atenolol (TENORMIN) 50 MG tablet TAKE 1 & 1/2 TABLET BY MOUTH TWICE DAILY **NEED OFFICE VISIT 02/04/21 02/05/21  Isaac Bliss, Rayford Halsted, MD    Allergies    Patient has no known allergies.  Review of Systems   Review of Systems  Constitutional:  Negative for activity change and fever.  HENT:  Negative for facial swelling and trouble swallowing.   Eyes:  Positive for visual disturbance. Negative for discharge and redness.  Respiratory:  Negative for cough and shortness of breath.    Cardiovascular:  Negative for chest pain and palpitations.  Gastrointestinal:  Negative for abdominal pain and nausea.  Genitourinary:  Negative for dysuria and flank pain.  Musculoskeletal:  Negative for back pain and gait problem.  Skin:  Negative for pallor and rash.  Neurological:  Positive for weakness and numbness. Negative for syncope and headaches.   Physical Exam Updated Vital Signs BP (!) 177/82   Pulse 88   Temp 97.9 F (36.6 C) (Oral)   Resp 18   Ht '5\' 2"'$  (1.575 m)   Wt 88.5 kg   SpO2 100%   BMI 35.67 kg/m   Physical Exam Vitals and nursing note reviewed.  Constitutional:      General: She is not in acute distress.    Appearance: Normal appearance.  HENT:     Head: Normocephalic and atraumatic.     Jaw: There is normal jaw occlusion.     Right Ear: External ear normal.     Left Ear: External ear normal.     Nose: Nose normal.     Mouth/Throat:     Mouth: Mucous membranes are moist.  Eyes:     General: Lids are everted, no foreign bodies appreciated. Gaze aligned appropriately. No scleral icterus.       Right eye: No discharge.        Left eye: No discharge.     Comments: Visual fields intact  Cardiovascular:     Rate and Rhythm: Normal rate and regular rhythm.     Pulses: Normal pulses.     Heart sounds: Normal heart sounds.  Pulmonary:     Effort: Pulmonary effort is normal. No respiratory distress.     Breath sounds: Normal breath sounds.  Abdominal:     General: Abdomen is flat.     Tenderness: There is no abdominal tenderness.  Musculoskeletal:        General: Normal range of motion.     Cervical back: Normal range of motion.     Right lower leg: No edema.     Left lower leg: No edema.     Comments: LE braces  Skin:    General: Skin is warm and dry.     Capillary Refill: Capillary refill takes less  than 2 seconds.  Neurological:     Mental Status: She is alert and oriented to person, place, and time.     GCS: GCS eye subscore is 4. GCS  verbal subscore is 5. GCS motor subscore is 6.     Cranial Nerves: Cranial nerves are intact.     Sensory: Sensory deficit present.     Motor: Motor function is intact. No tremor.     Coordination: Coordination is intact. Finger-Nose-Finger Test normal.     Comments: Left sided facial sensation reduced, left UE sensation reduced   Psychiatric:        Mood and Affect: Mood normal.        Behavior: Behavior normal.    ED Results / Procedures / Treatments   Labs (all labs ordered are listed, but only abnormal results are displayed) Labs Reviewed  CBC - Abnormal; Notable for the following components:      Result Value   Hemoglobin 15.2 (*)    All other components within normal limits  COMPREHENSIVE METABOLIC PANEL - Abnormal; Notable for the following components:   Sodium 125 (*)    Chloride 92 (*)    Glucose, Bld 111 (*)    BUN 7 (*)    All other components within normal limits  I-STAT CHEM 8, ED - Abnormal; Notable for the following components:   Sodium 125 (*)    Chloride 94 (*)    Creatinine, Ser 0.40 (*)    Glucose, Bld 112 (*)    Calcium, Ion 1.05 (*)    Hemoglobin 15.6 (*)    All other components within normal limits  RESP PANEL BY RT-PCR (FLU A&B, COVID) ARPGX2  PROTIME-INR  APTT  DIFFERENTIAL  ETHANOL  RAPID URINE DRUG SCREEN, HOSP PERFORMED  URINALYSIS, ROUTINE W REFLEX MICROSCOPIC  CBG MONITORING, ED  TYPE AND SCREEN    EKG EKG Interpretation  Date/Time:  Friday April 25 2021 15:08:12 EDT Ventricular Rate:  76 PR Interval:  192 QRS Duration: 102 QT Interval:  396 QTC Calculation: 446 R Axis:   -9 Text Interpretation: Sinus rhythm Ventricular premature complex Significant artifact present Similar to prior tracing Confirmed by Wynona Dove ([id number]) on 04/25/2021 5:12:22 PM  Radiology CT HEAD CODE STROKE WO CONTRAST  Result Date: 04/25/2021 CLINICAL DATA:  Code stroke.  Acute onset left facial droop EXAM: CT HEAD WITHOUT CONTRAST TECHNIQUE: Contiguous  axial images were obtained from the base of the skull through the vertex without intravenous contrast. COMPARISON:  CT/CTA head 06/02/2016 FINDINGS: Brain: Postsurgical changes reflecting biparietal craniectomy are again seen with multiple wires fixing the cranioplasty. Metallic densities are also seen along the falx posteriorly and at the vertex. Streak artifact from the metal again degrades evaluation of this region There is extensive confluent hypodensity in the bilateral parietal lobes and posterior aspects of the frontal lobes, similar to the prior study. A remote infarct in the right caudate/lentiform nucleus is again seen. There is no evidence of acute intracranial hemorrhage, extra-axial fluid collection, or infarct. Vascular: No hyperdense vessel or unexpected calcification. Skull: There is a mottled appearance to the calvarial marrow, unchanged. There is no new or suspicious lesion. There is no evidence of fracture. Sinuses/Orbits: The paranasal sinuses are clear. The globes and orbits are unremarkable. Other: None. ASPECTS [facility] Stroke Program Early CT Score) - Ganglionic level infarction (caudate, lentiform nuclei, internal capsule, insula, M1-M3 cortex): 7 - Supraganglionic infarction (M4-M6 cortex): 3 Total score (0-10 with 10 being normal): 10 IMPRESSION: 1. No evidence of  acute intracranial hemorrhage or infarct. 2. ASPECTS is 10 3. Stable postsurgical changes and encephalomalacia in the bilateral frontal and parietal lobes as above. These results were paged via AMION at the time of interpretation on 04/25/2021 at 3:58 pm to provider Dr Cheral Marker, who verbally acknowledged these results. Electronically Signed   By: Valetta Mole M.D.   On: 04/25/2021 15:58   CT ANGIO HEAD CODE STROKE  Result Date: 04/25/2021 CLINICAL DATA:  Acute onset left facial droop EXAM: CT ANGIOGRAPHY HEAD AND NECK TECHNIQUE: Multidetector CT imaging of the head and neck was performed using the standard protocol during bolus  administration of intravenous contrast. Multiplanar CT image reconstructions and MIPs were obtained to evaluate the vascular anatomy. Carotid stenosis measurements (when applicable) are obtained utilizing NASCET criteria, using the distal internal carotid diameter as the denominator. CONTRAST:  22m OMNIPAQUE IOHEXOL 350 MG/ML SOLN COMPARISON:  CT head 04/25/2021, CTA 06/02/2016 FINDINGS: CT HEAD FINDINGS Brain: Postsurgical changes of biparietal craniectomy with wires fixing the cranioplasty, and metallic densities along the falx, which cause streak artifact that limits evaluation. Redemonstrated hypodensity in the bilateral parietal lobes and posterior frontal lobes, unchanged. No acute intracranial hemorrhage, extra-axial fluid collection, mass, mass effect or midline shift. Vascular: No hyperdense vessel Skull: Postsurgical changes of biparietal craniectomy Sinuses: Imaged portions are clear. Orbits: No acute finding. Review of the MIP images confirms the above findings CTA NECK FINDINGS Aortic arch: Partially imaged. Imaged portion shows no evidence of aneurysm or dissection. No significant stenosis of the major arch vessel origins. Right carotid system: No evidence of dissection, stenosis (50% or greater) or occlusion. Focal calcified plaque in the proximal right ICA which is not hemodynamically significant. Left carotid system: Evaluation of the distal left common carotid is limited by motion artifact. Within this limitation, no evidence of dissection, stenosis (50% or greater), or occlusion. Vertebral arteries: Left dominant. No evidence of dissection, stenosis (50% or greater) or occlusion. Skeleton: No acute osseous abnormality. Redemonstrated advanced cervical degenerative changes, with mild reversal of the normal cervical lordosis C4-C7. Disc height loss is worst at C5-C6 and C6-C7. Other neck: Subcentimeter hypoattenuating nodules in the thyroid. Upper chest: No focal pulmonary opacity.  No pleural  effusion. Review of the MIP images confirms the above findings CTA HEAD FINDINGS Anterior circulation: Both internal carotid arteries are widely patent to the termini, without stenosis or other abnormality. A1 segments patent. Normal anterior communicating artery. Anterior cerebral arteries are widely patent to their distal aspects. No M1 stenosis or occlusion. Distal MCA branches are symmetric but somewhat attenuated bilaterally, without focal occlusion, however evaluation is somewhat limited by metal artifact. Posterior circulation: Vertebral arteries widely patent to the vertebrobasilar junction without stenosis. Basilar patent to its distal aspect. Superior cerebral arteries patent bilaterally. PCAs patent to their distal aspects without stenosis. Venous sinuses: The superior sagittal sinus is not well evaluated due to artifact. The transverse and sigmoid sinuses are unremarkable. Anatomic variants: The bilateral posterior communicating arteries are not visualized. Review of the MIP images confirms the above findings IMPRESSION: No large vessel occlusion or hemodynamically significant stenosis. Electronically Signed   By: AMerilyn BabaM.D.   On: 04/25/2021 16:43   CT ANGIO NECK CODE STROKE  Result Date: 04/25/2021 CLINICAL DATA:  Acute onset left facial droop EXAM: CT ANGIOGRAPHY HEAD AND NECK TECHNIQUE: Multidetector CT imaging of the head and neck was performed using the standard protocol during bolus administration of intravenous contrast. Multiplanar CT image reconstructions and MIPs were obtained to evaluate the vascular  anatomy. Carotid stenosis measurements (when applicable) are obtained utilizing NASCET criteria, using the distal internal carotid diameter as the denominator. CONTRAST:  1m OMNIPAQUE IOHEXOL 350 MG/ML SOLN COMPARISON:  CT head 04/25/2021, CTA 06/02/2016 FINDINGS: CT HEAD FINDINGS Brain: Postsurgical changes of biparietal craniectomy with wires fixing the cranioplasty, and metallic  densities along the falx, which cause streak artifact that limits evaluation. Redemonstrated hypodensity in the bilateral parietal lobes and posterior frontal lobes, unchanged. No acute intracranial hemorrhage, extra-axial fluid collection, mass, mass effect or midline shift. Vascular: No hyperdense vessel Skull: Postsurgical changes of biparietal craniectomy Sinuses: Imaged portions are clear. Orbits: No acute finding. Review of the MIP images confirms the above findings CTA NECK FINDINGS Aortic arch: Partially imaged. Imaged portion shows no evidence of aneurysm or dissection. No significant stenosis of the major arch vessel origins. Right carotid system: No evidence of dissection, stenosis (50% or greater) or occlusion. Focal calcified plaque in the proximal right ICA which is not hemodynamically significant. Left carotid system: Evaluation of the distal left common carotid is limited by motion artifact. Within this limitation, no evidence of dissection, stenosis (50% or greater), or occlusion. Vertebral arteries: Left dominant. No evidence of dissection, stenosis (50% or greater) or occlusion. Skeleton: No acute osseous abnormality. Redemonstrated advanced cervical degenerative changes, with mild reversal of the normal cervical lordosis C4-C7. Disc height loss is worst at C5-C6 and C6-C7. Other neck: Subcentimeter hypoattenuating nodules in the thyroid. Upper chest: No focal pulmonary opacity.  No pleural effusion. Review of the MIP images confirms the above findings CTA HEAD FINDINGS Anterior circulation: Both internal carotid arteries are widely patent to the termini, without stenosis or other abnormality. A1 segments patent. Normal anterior communicating artery. Anterior cerebral arteries are widely patent to their distal aspects. No M1 stenosis or occlusion. Distal MCA branches are symmetric but somewhat attenuated bilaterally, without focal occlusion, however evaluation is somewhat limited by metal  artifact. Posterior circulation: Vertebral arteries widely patent to the vertebrobasilar junction without stenosis. Basilar patent to its distal aspect. Superior cerebral arteries patent bilaterally. PCAs patent to their distal aspects without stenosis. Venous sinuses: The superior sagittal sinus is not well evaluated due to artifact. The transverse and sigmoid sinuses are unremarkable. Anatomic variants: The bilateral posterior communicating arteries are not visualized. Review of the MIP images confirms the above findings IMPRESSION: No large vessel occlusion or hemodynamically significant stenosis. Electronically Signed   By: AMerilyn BabaM.D.   On: 04/25/2021 16:43    Procedures .Critical Care Performed by: GJeanell Sparrow DO Authorized by: GJeanell Sparrow DO   Critical care provider statement:    Critical care time (minutes):  37   Critical care time was exclusive of:  Separately billable procedures and treating other patients   Critical care was necessary to treat or prevent imminent or life-threatening deterioration of the following conditions:  CNS failure or compromise   Critical care was time spent personally by me on the following activities:  Discussions with consultants, evaluation of patient's response to treatment, examination of patient, ordering and performing treatments and interventions, ordering and review of laboratory studies, ordering and review of radiographic studies, pulse oximetry, re-evaluation of patient's condition, obtaining history from patient or surrogate and review of old charts   Care discussed with: admitting provider     Medications Ordered in ED Medications  iohexol (OMNIPAQUE) 350 MG/ML injection 80 mL (80 mLs Intravenous Contrast Given 04/25/21 1622)    ED Course  I have reviewed the triage vital signs and  the nursing notes.  Pertinent labs & imaging results that were available during my care of the patient were reviewed by me and considered in my medical  decision making (see chart for details).    MDM Rules/Calculators/A&P                           This patient complains of numbness, weakness; this involves an extensive number of treatment options and is a complaint that carries with it a high risk of complications and morbidity. Vital signs reviewed and stable. Serious etiologies considered.   NIHSS 1 with sensation reduction. Symptoms improving.   Stroke alert activated as pt within TPA window.  Patient sent to CT.  Neurology notified.  Labs reviewed and are stable.  He has chronic hyponatremia, mildly reduced today.  125.  Prior was 130.  Is also hemoconcentrated.  EKG reviewed, no evidence acute ischemia, no STEMI.  CT imaging reviewed by neurology, radiology, myself.  No acute infarct.  Suspicion for TIA is etiology of presenting symptoms.  Neurology recommends admission for ongoing evaluation.  Patient is agreeable.  Accepted by family medicine team. Neurology following     Final Clinical Impression(s) / ED Diagnoses Final diagnoses:  TIA (transient ischemic attack)  Hyponatremia    Rx / DC Orders ED Discharge Orders     None        Jeanell Sparrow, DO 04/25/21 1837

## 2021-04-25 NOTE — ED Triage Notes (Signed)
Pt arrived via [facility] with c/c of stroke like symptoms. Per EMS pt had acute onset of left side facial droop and flaccid left side that lasted about 5 mins. Hx of TIAs & brain surgery. Pt can't have MRI due to mesh). Pt has complaints currently of left side tingling and numbness.   150/90, 70HR, 116 CBG, 100 RA

## 2021-04-26 ENCOUNTER — Inpatient Hospital Stay (HOSPITAL_BASED_OUTPATIENT_CLINIC_OR_DEPARTMENT_OTHER): Payer: PPO

## 2021-04-26 ENCOUNTER — Encounter (HOSPITAL_COMMUNITY): Payer: Self-pay | Admitting: Family Medicine

## 2021-04-26 DIAGNOSIS — G459 Transient cerebral ischemic attack, unspecified: Secondary | ICD-10-CM

## 2021-04-26 LAB — COMPREHENSIVE METABOLIC PANEL
ALT: 20 U/L (ref 0–44)
AST: 19 U/L (ref 15–41)
Albumin: 4 g/dL (ref 3.5–5.0)
Alkaline Phosphatase: 46 U/L (ref 38–126)
Anion gap: 10 (ref 5–15)
BUN: 7 mg/dL — ABNORMAL LOW (ref 8–23)
CO2: 22 mmol/L (ref 22–32)
Calcium: 9.7 mg/dL (ref 8.9–10.3)
Chloride: 100 mmol/L (ref 98–111)
Creatinine, Ser: 0.52 mg/dL (ref 0.44–1.00)
GFR, Estimated: >60 mL/min (ref >60)
Glucose, Bld: 98 mg/dL (ref 70–99)
Potassium: 3.6 mmol/L (ref 3.5–5.1)
Sodium: 132 mmol/L — ABNORMAL LOW (ref 135–145)
Total Bilirubin: 0.3 mg/dL (ref 0.3–1.2)
Total Protein: 6.6 g/dL (ref 6.5–8.1)

## 2021-04-26 LAB — ECHOCARDIOGRAM COMPLETE
Area-P 1/2: 3.27 cm2
Calc EF: 34.3 %
Height: 62 in
S' Lateral: 3.9 cm
Single Plane A2C EF: 39.4 %
Single Plane A4C EF: 30.2 %
Weight: 3104.08 oz

## 2021-04-26 LAB — CBC
HCT: 42.8 % (ref 36.0–46.0)
Hemoglobin: 15 g/dL (ref 12.0–15.0)
MCH: 31.2 pg (ref 26.0–34.0)
MCHC: 35 g/dL (ref 30.0–36.0)
MCV: 89 fL (ref 80.0–100.0)
Platelets: 234 10*3/uL (ref 150–400)
RBC: 4.81 MIL/uL (ref 3.87–5.11)
RDW: 12.8 % (ref 11.5–15.5)
WBC: 8.7 10*3/uL (ref 4.0–10.5)
nRBC: 0 % (ref 0.0–0.2)

## 2021-04-26 LAB — HEMOGLOBIN A1C
Hgb A1c MFr Bld: 5.2 % (ref 4.8–5.6)
Mean Plasma Glucose: 102.54 mg/dL

## 2021-04-26 LAB — LIPID PANEL
Cholesterol: 145 mg/dL (ref 0–200)
HDL: 66 mg/dL (ref >40)
LDL Cholesterol: 67 mg/dL (ref 0–99)
Total CHOL/HDL Ratio: 2.2 RATIO
Triglycerides: 62 mg/dL (ref <150)
VLDL: 12 mg/dL (ref 0–40)

## 2021-04-26 LAB — TSH: TSH: 0.558 u[IU]/mL (ref 0.350–4.500)

## 2021-04-26 LAB — ABO/RH: ABO/RH(D): A POS

## 2021-04-26 MED ORDER — LISINOPRIL 40 MG PO TABS
40.0000 mg | ORAL_TABLET | Freq: Every day | ORAL | Status: DC
  Administered 2021-04-26: 40 mg via ORAL
  Filled 2021-04-26: qty 1

## 2021-04-26 MED ORDER — BACLOFEN 20 MG PO TABS
20.0000 mg | ORAL_TABLET | Freq: Four times a day (QID) | ORAL | Status: DC
  Administered 2021-04-26 (×3): 20 mg via ORAL
  Filled 2021-04-26 (×5): qty 1

## 2021-04-26 MED ORDER — METOPROLOL TARTRATE 50 MG PO TABS
50.0000 mg | ORAL_TABLET | Freq: Two times a day (BID) | ORAL | Status: DC
  Administered 2021-04-26: 50 mg via ORAL
  Filled 2021-04-26: qty 1

## 2021-04-26 MED ORDER — INFLUENZA VAC A&B SA ADJ QUAD 0.5 ML IM PRSY
0.5000 mL | PREFILLED_SYRINGE | INTRAMUSCULAR | Status: DC
  Filled 2021-04-26: qty 0.5

## 2021-04-26 MED ORDER — ASPIRIN 81 MG PO TBEC: 81.0000 mg | DELAYED_RELEASE_TABLET | Freq: Every day | ORAL | 1 refills | Status: DC

## 2021-04-26 NOTE — Progress Notes (Signed)
FPTS Brief Progress Note  S: Received a page stating that the patient had a headache.  Once we were able to get to bedside, the patient said that her headache had improved about 80%.   O: BP (!) 144/100   Pulse 97   Temp 97.9 F (36.6 C) (Oral)   Resp 20   Ht '5\' 2"'$  (1.575 m)   Wt 88.5 kg   SpO2 97%   BMI 35.67 kg/m   Constitutional: NAD, resting in bed Cardio: RRR, no murmurs, no rubs or gallops  Respiratory: CTAB Neuro: AxOx4. No evident neurologic deficits. Possible minimal left sided droop that disappears with smiling-unchanged from neuro exam on admission   A/P: Stacy Diaz is a 70 year old female who presented with left-sided weakness.  Neuro and stroke team are following.  Because her headache has almost completely resolved, we will not continue with any additional medication at this time.  She is neurologically intact.  Will reevaluate as needed tomorrow.  Refer to HPI for additional plan.  Erskine Emery, MD 04/26/2021, 12:33 AM PGY-1, [facility] Night Resident  Please page [phone] with questions.

## 2021-04-26 NOTE — Discharge Instructions (Addendum)
It was a pleasure to take care of you while you were in the hospital!  While admitted, you were treated for a TIA.   Please take aspirin 81 mg and Plavix 75 mg every day for 1 month.  Then continue on aspirin alone thereafter. Please continue to take your statin medication daily On the ultrasound of your heart we found changes that show decreased heart function, you will need to follow-up with a cardiologist. Please make an appointment to see your primary care physician in 1 week  If you have any return of symptoms, any signs of strokelike change in vision, change in speaking ability, change in sensation, change in ability to move, please return to the emergency department.

## 2021-04-26 NOTE — Progress Notes (Addendum)
Family Medicine Teaching Service Daily Progress Note Intern Pager: [phone]  Patient name: Stacy Diaz Medical record number: [id number] Date of birth: 06-22-1951 Age: 70 y.o. Gender: female  Primary Care Provider: Isaac Bliss, Rayford Halsted, MD Consultants: Neurology Code Status: Full  Pt Overview and Major Events to Date:  04/25/2021: Admitted to FPTS   Assessment and Plan: Stacy Diaz is a 70 year old female presented initially with left-sided weakness.  Past medical history significant for hypertension, hyperlipidemia, CAD, history of MI, history of breast cancer, CVA, meningioma status post resection, seizure disorder.  Left-sided weakness with facial droop (resolved) The left-sided facial droop that the patient had on 9/9 has completely resolved per patient. On physical exam, pt's neuro exam is at baseline.  Patient has been unable to receive an MRI due to metal clips in the brain.  Risk stratification labs show HA1C 5.2, TSH 0.558, and lipid panel Total cholesterol 145, LDL 67, triglycerides 62.  UA dirty-chronically incontinent with external foley.  CMP and CBC: Na 132 but all else unremarkable. -Neuro consulted, stroke team to follow, appreciate their recommendations  -ASA and plavix started per neuro -Permissive hypertension window for 24 hours less than 220/110 - PT/OT evaluate and treat - SLP consulted - Consider TTE per neuro - Neurochecks every 4 hours - Regular diet  Hyponatremia Sodium today is 132 from 125.  Patient is hyponatremic at baseline for the last several months to 130.  No significant evidence of volume overload on exam.  Patient is not symptomatic. -Monitor with regular CMP  Hypertension Blood pressure noted today is: 147/90, 146/81.  Home medications include lisinopril and metoprolol. - We will hold home medications given permissive window for 24 hours - Plan to restart home medications after permissive window  Hyperlipidemia Updated Lipid Panel  04/26/21: LDL 67, total 145, triglycerides 62. Home dose: Atorvastatin 10 mg daily. -Continue with home dose  -No current need for high intensity dose statin given LDL<70, can decide outpatient   H/o CVA Home meds: Plavix '75mg'$  1xdaily -Continue with plavix and ASA per neuro   Seizure disorder, stable  Taking oxcarbazepine and hasn't had a seizure in several years   Meningioma s/p resection in 1999  Spastic Paraparesis LE, chronic  Home medications include percocet, baclofen, lidocaine cream, and valium -Continue Percocet 5-325 and oxycodone 5 mg every 8 hours PRN per pharmacy for a combination of 10 mg oxycodone and 325 mg Tylenol, lidocaine cream, and valium '5mg'$  1xnightly.  -restart baclofen -Will re-evaluate med list prior to discharge    Chronic constipation  Bowel regimen in hospital: miralax and senna   FEN/GI: Heart Healthy  PPx: Lovenox  Dispo:Home pending clinical improvement  and further neuro work up.    Subjective:  Pt is doing well with no new complaints this morning.  She reports that the baclofen she takes at night helps her relax.  Objective: Temp:  [97.9 F (36.6 C)-98.8 F (37.1 C)] 97.9 F (36.6 C) (09/09 1535) Pulse Rate:  [72-97] 72 (09/10 0600) Resp:  [17-25] 17 (09/10 0600) BP: (124-188)/(67-100) 134/73 (09/10 0600) SpO2:  [95 %-100 %] 96 % (09/10 0600) Weight:  [88.5 kg] 88.5 kg (09/09 1500) Physical Exam Vitals reviewed.  Constitutional:      General: She is not in acute distress.    Appearance: She is not ill-appearing.  Eyes:     Pupils: Pupils are equal, round, and reactive to light.  Cardiovascular:     Rate and Rhythm: Normal rate and regular rhythm.  Pulses: Normal pulses.     Heart sounds: No murmur heard.   No friction rub.  Pulmonary:     Effort: Pulmonary effort is normal. No respiratory distress.     Breath sounds: Normal breath sounds. No wheezing.  Neurological:     Mental Status: She is alert and oriented to person, place,  and time.     Sensory: Sensation is intact. No sensory deficit.     Motor: Motor function is intact.     Comments: AxOx4. Chronic LE paraparesis with no movement of lower extremities Strength of UE at baseline. Normal sensation.  Psychiatric:        Mood and Affect: Mood normal.        Behavior: Behavior normal.     Laboratory: Recent Labs  Lab 04/25/21 1610 04/25/21 1618 04/26/21 0229  WBC 9.4  --  8.7  HGB 15.2* 15.6* 15.0  HCT 44.3 46.0 42.8  PLT 214  --  234   Recent Labs  Lab 04/25/21 1610 04/25/21 1618 04/26/21 0229  NA 125* 125* 132*  K 4.3 4.3 3.6  CL 92* 94* 100  CO2 22  --  22  BUN 7* 8 7*  CREATININE 0.54 0.40* 0.52  CALCIUM 9.1  --  9.7  PROT 7.2  --  6.6  BILITOT 0.9  --  0.3  ALKPHOS 48  --  46  ALT 22  --  20  AST 29  --  19  GLUCOSE 111* 112* 98      Erskine Emery, MD 04/26/2021, 6:43 AM PGY-1, [place] Intern pager: [phone], text pages welcome

## 2021-04-26 NOTE — Progress Notes (Signed)
Discharge packet printed and provided to primary night nurse Toniann Fail, RN for patient's education.

## 2021-04-26 NOTE — Progress Notes (Signed)
  Echocardiogram 2D Echocardiogram has been performed.  Stacy Diaz 04/26/2021, 3:26 PM

## 2021-04-26 NOTE — Evaluation (Signed)
Physical Therapy Evaluation Patient Details Name: Stacy Diaz MRN: [id number] DOB: 1950/12/26 Today's Date: 04/26/2021   History of Present Illness  Pt is a 70 y.o. female presenting 9/9 with L sided weakness . PMH is significant for HTN, HLD, CAD, MI, Breast Cancer, CVA, spastic paraparesis s/p remote meningioma resection, s/p IM nail R tibia, and seizures.   Clinical Impression  Pt admitted with above diagnosis. PTA pt lives at home with spouse and has a caregiver 2 hrs/day, 5-6 days/week. At baseline, she requires assist for transfers using pivot disc and is power w/c dependent. On eval, pt required max assist rolling, and total assist supine <> sit. Poor sitting balance EOB. Pt/husband report she has been gradually requiring more assist to perform mobility and ADLs. Pt reports new onset L side weakness has resolved. Pt will benefit from skilled PT to increase their independence and safety with mobility to allow discharge to the venue listed below.       Follow Up Recommendations Home health PT;Supervision/Assistance - 24 hour    Equipment Recommendations  None recommended by PT (Pt has all needed DME.)    Recommendations for Other Services       Precautions / Restrictions Precautions Precautions: Fall      Mobility  Bed Mobility Overal bed mobility: Needs Assistance Bed Mobility: Rolling;Supine to Sit;Sit to Supine Rolling: Max assist   Supine to sit: Total assist Sit to supine: Total assist   General bed mobility comments: increased tone in extensors (trunk and BLE) resulting in heavy push/lean posteriorly. Required return to supine due to sliding off EOB.    Transfers                 General transfer comment: unable to safely progress OOB. Pt uses pivot disc at baseline.  Ambulation/Gait             General Gait Details: nonambulatory  Stairs            Wheelchair Mobility    Modified Rankin (Stroke Patients Only) Modified Rankin (Stroke  Patients Only) Pre-Morbid Rankin Score: Moderately severe disability Modified Rankin: Moderately severe disability     Balance Overall balance assessment: Needs assistance Sitting-balance support: Feet supported;No upper extremity supported Sitting balance-Leahy Scale: Poor Sitting balance - Comments: max assist to maintain balance                                     Pertinent Vitals/Pain Pain Assessment: No/denies pain    Home Living Family/patient expects to be discharged to:: Private residence Living Arrangements: Spouse/significant other Available Help at Discharge: Family;Personal care attendant;Available 24 hours/day Type of Home: House Home Access: Ramped entrance;Other (comment) (chair lift)     Home Layout: One level Home Equipment: Wheelchair - power;Hospital bed;Other (comment);Grab bars - tub/shower;Grab bars - toilet;Bedside commode (hoyer lift, roll in shower transport chair, pivot disc)      Prior Function Level of Independence: Needs assistance   Gait / Transfers Assistance Needed: spin disc for transfers, heavy assist for all mobility, nonambulatory, uses power w/c  ADL's / Homemaking Assistance Needed: caregiver 2 hrs/day 5-6 days/week. Uses depends vs [employer] home system. Pt able to feed herself. Assist for all other ADLs.        Hand Dominance   Dominant Hand: Right    Extremity/Trunk Assessment   Upper Extremity Assessment Upper Extremity Assessment: Generalized weakness (strength appears symmetrical. Sensation intact.)  Lower Extremity Assessment Lower Extremity Assessment: RLE deficits/detail;LLE deficits/detail RLE Deficits / Details: spastic paraplegia, flexible foot drop bilat RLE Sensation: WNL LLE Deficits / Details: spastic paraplegia, flexible foot drop bilat LLE Sensation: WNL       Communication   Communication: No difficulties  Cognition Arousal/Alertness: Awake/alert Behavior During Therapy: WFL for tasks  assessed/performed Overall Cognitive Status: Within Functional Limits for tasks assessed                                        General Comments      Exercises Other Exercises Other Exercises: stretching/PROM BLE   Assessment/Plan    PT Assessment Patient needs continued PT services  PT Problem List Decreased mobility;Decreased range of motion;Decreased balance;Impaired tone       PT Treatment Interventions Therapeutic activities;Therapeutic exercise;Patient/family education;Balance training;Functional mobility training    PT Goals (Current goals can be found in the Care Plan section)  Acute Rehab PT Goals Patient Stated Goal: home PT Goal Formulation: With patient/family Time For Goal Achievement: 05/10/21 Potential to Achieve Goals: Good    Frequency Min 3X/week   Barriers to discharge        Co-evaluation               AM-PAC PT "6 Clicks" Mobility  Outcome Measure Help needed turning from your back to your side while in a flat bed without using bedrails?: Total Help needed moving from lying on your back to sitting on the side of a flat bed without using bedrails?: Total Help needed moving to and from a bed to a chair (including a wheelchair)?: Total Help needed standing up from a chair using your arms (e.g., wheelchair or bedside chair)?: Total Help needed to walk in hospital room?: Total Help needed climbing 3-5 steps with a railing? : Total 6 Click Score: 6    End of Session   Activity Tolerance: Patient tolerated treatment well Patient left: in bed;with call bell/phone within reach;with bed alarm set;with family/visitor present Nurse Communication: Mobility status;Other (comment) (purewick needs replaced) PT Visit Diagnosis: Other abnormalities of gait and mobility (R26.89)    Time: [id number] PT Time Calculation (min) (ACUTE ONLY): 29 min   Charges:   PT Evaluation $PT Eval Moderate Complexity: 1 Mod PT Treatments $Therapeutic  Activity: 8-22 mins        Lorrin Goodell, PT  Office # [phone] Pager [phone]   Stacy Diaz 04/26/2021, 2:01 PM

## 2021-04-26 NOTE — Discharge Summary (Signed)
[facility] Discharge Summary  Patient name: Stacy Diaz record number: [id number] Date of birth: [birth year] Age: 70 y.o. Gender: female Date of Admission: 04/25/2021  Date of Discharge: 04/26/2021 Admitting Physician: Stacy Rio, MD  Primary Care Provider: Isaac Diaz, Stacy Halsted, MD Consultants: Neurology  Indication for Hospitalization: TIA  Discharge Diagnoses/Problem List:  TIA Tension Hyperlipidemia CAD History of MI History of breast cancer CVA Meningioma status postresection Seizure disorder Hyponatremia  Disposition: To home  Discharge Condition: stable and improved  Discharge Exam:  Temp:  [97.9 F (36.6 C)-98.8 F (37.1 C)] 97.9 F (36.6 C) (09/09 1535) Pulse Rate:  [72-97] 72 (09/10 0600) Resp:  [17-25] 17 (09/10 0600) BP: (124-188)/(67-100) 134/73 (09/10 0600) SpO2:  [95 %-100 %] 96 % (09/10 0600) Weight:  [88.5 kg] 88.5 kg (09/09 1500) Physical Exam Vitals reviewed.  Constitutional:      General: She is not in acute distress.    Appearance: She is not ill-appearing.  Eyes:     Pupils: Pupils are equal, round, and reactive to light.  Cardiovascular:     Rate and Rhythm: Normal rate and regular rhythm.     Pulses: Normal pulses.     Heart sounds: No murmur heard.   No friction rub.  Pulmonary:     Effort: Pulmonary effort is normal. No respiratory distress.     Breath sounds: Normal breath sounds. No wheezing.  Neurological:     Mental Status: She is alert and oriented to person, place, and time.     Sensory: Sensation is intact. No sensory deficit.     Motor: Motor function is intact.     Comments: AxOx4. Chronic LE paraparesis with no movement of lower extremities Strength of UE at baseline. Normal sensation.  Psychiatric:        Mood and Affect: Mood normal.        Behavior: Behavior normal.   Brief Hospital Course:  TIA 70 year old woman presented with left-sided weakness and facial  drooping that started around 1 PM 9/9.  It completely resolved by about an hour after onset.  Of note patient has history of multiple TIAs in the past similar symptoms and time course.  Labs notable on admission included sodium to 125, glucose mildly elevated 111.  CTA head neck demonstrated no large vessel occlusion or hemodynamically significant stenosis.  Patient unable to perform MRI due to metal clips retained since craniotomy, s/p meningioma resection.  EKG notable for NSR, PVCs present with no acute ST changes.  UDS positive for benzodiazepine (patient on Valium at home as prescribed).  Restratification labs show total cholesterol appropriate at 145, HDL 66, LDL 67.  Patient is at goal and compliant with statin medication.  Hemoglobin A1c 5.2%, within normal limits.  Neurology examined patient and recommended aspirin 81 mg daily, clopidogrel 75 mg daily.  Permissive hypertension for the first 24 hours, and then restarted home medications.  Echo performed which showed borderline low left ventricular output, EF 40 to 45%, global hypokinesis, G1DD. She will require cardiology follow up for new HFrEF diagnosis. PT examined patient and recommended HH PT.  Hyponatremia Patient admitted with Na 125, improved today to 132.  Patient has history of chronic hyponatremia and is usually in the 130s.  Hypertension Blood pressure was mildly elevated to the 140s over 90s during admission.  Home medication was held for permissive hypertension for 24 hours and then restarted.  Hyperlipidemia Patient is compliant with statin, total cholesterol 145, HDL 66,  LDL 67.  Continue statin  The rest of her problems were chronic and stable during this admission  Follow Up: Patient to continue DAPT ASA 81 mg and plavix 75 mg for one month, and then continue on ASA thereafter. Continue statin. Home Health PT ordered Follow up with cardiology for new decreased LVEF on echo Follow up with Dr. Leonie Man, [facility] in 1 month Follow-up with PCP regarding hyponatremia  Significant Procedures: none  Significant Labs and Imaging:  Recent Labs  Lab 04/25/21 1610 04/25/21 1618 04/26/21 0229  WBC 9.4  --  8.7  HGB 15.2* 15.6* 15.0  HCT 44.3 46.0 42.8  PLT 214  --  234   Recent Labs  Lab 04/25/21 1610 04/25/21 1618 04/26/21 0229  NA 125* 125* 132*  K 4.3 4.3 3.6  CL 92* 94* 100  CO2 22  --  22  GLUCOSE 111* 112* 98  BUN 7* 8 7*  CREATININE 0.54 0.40* 0.52  CALCIUM 9.1  --  9.7  ALKPHOS 48  --  46  AST 29  --  19  ALT 22  --  20  ALBUMIN 4.4  --  4.0   ECHOCARDIOGRAM COMPLETE  Result Date: 04/26/2021    ECHOCARDIOGRAM REPORT   Patient Name:   Stacy Diaz Date of Exam: 04/26/2021 Medical Rec #:  [id number]        Height:       62.0 in Accession #:    [id number]       Weight:       194.0 lb Date of Birth:  [birth year]        BSA:          1.887 m Patient Age:    49 years         BP:           129/103 mmHg Patient Gender: F                HR:           73 bpm. Exam Location:  Inpatient Procedure: 2D Echo Indications:    TIA  History:        Patient has prior history of Echocardiogram examinations, most                 recent 06/04/2016. CAD, Arrythmias:PVC; Risk                 Factors:Hypertension and Dyslipidemia.  Sonographer:    Johny Chess RDCS Referring Phys: [place]  1. Left ventricular ejection fraction, by estimation, is 40 to 45%. The left ventricle has mildly decreased function. The left ventricle demonstrates global hypokinesis. Left ventricular diastolic parameters are consistent with Grade I diastolic dysfunction (impaired relaxation).  2. Right ventricular systolic function is normal. The right ventricular size is normal.  3. The mitral valve is normal in structure. No evidence of mitral valve regurgitation. No evidence of mitral stenosis.  4. The aortic valve is tricuspid. Aortic valve regurgitation is not visualized. Mild aortic valve  sclerosis is present, with no evidence of aortic valve stenosis.  5. The inferior vena cava is normal in size with greater than 50% respiratory variability, suggesting right atrial pressure of 3 mmHg. FINDINGS  Left Ventricle: Left ventricular ejection fraction, by estimation, is 40 to 45%. The left ventricle has mildly decreased function. The left ventricle demonstrates global hypokinesis. The left ventricular internal cavity size was normal in size. There is  no  left ventricular hypertrophy. Left ventricular diastolic parameters are consistent with Grade I diastolic dysfunction (impaired relaxation). Right Ventricle: The right ventricular size is normal. Right ventricular systolic function is normal. Left Atrium: Left atrial size was normal in size. Right Atrium: Right atrial size was normal in size. Pericardium: There is no evidence of pericardial effusion. Mitral Valve: The mitral valve is normal in structure. No evidence of mitral valve regurgitation. No evidence of mitral valve stenosis. Tricuspid Valve: The tricuspid valve is normal in structure. Tricuspid valve regurgitation is trivial. No evidence of tricuspid stenosis. Aortic Valve: The aortic valve is tricuspid. Aortic valve regurgitation is not visualized. Mild aortic valve sclerosis is present, with no evidence of aortic valve stenosis. Pulmonic Valve: The pulmonic valve was not well visualized. Pulmonic valve regurgitation is not visualized. No evidence of pulmonic stenosis. Aorta: The aortic root is normal in size and structure. Venous: The inferior vena cava is normal in size with greater than 50% respiratory variability, suggesting right atrial pressure of 3 mmHg. IAS/Shunts: No atrial level shunt detected by color flow Doppler.  LEFT VENTRICLE PLAX 2D LVIDd:         5.00 cm     Diastology LVIDs:         3.90 cm     LV e' medial:    5.33 cm/s LV PW:         0.90 cm     LV E/e' medial:  10.0 LV IVS:        0.90 cm     LV e' lateral:   5.98 cm/s LVOT  diam:     2.20 cm     LV E/e' lateral: 8.9 LV SV:         60 LV SV Index:   32 LVOT Area:     3.80 cm  LV Volumes (MOD) LV vol d, MOD A2C: 62.7 ml LV vol d, MOD A4C: 68.9 ml LV vol s, MOD A2C: 38.0 ml LV vol s, MOD A4C: 48.1 ml LV SV MOD A2C:     24.7 ml LV SV MOD A4C:     68.9 ml LV SV MOD BP:      23.6 ml RIGHT VENTRICLE             IVC RV S prime:     11.00 cm/s  IVC diam: 1.30 cm TAPSE (M-mode): 1.9 cm LEFT ATRIUM             Index       RIGHT ATRIUM           Index LA diam:        4.00 cm 2.12 cm/m  RA Area:     15.30 cm LA Vol (A2C):   47.3 ml 25.06 ml/m RA Volume:   41.50 ml  21.99 ml/m LA Vol (A4C):   42.5 ml 22.52 ml/m LA Biplane Vol: 45.2 ml 23.95 ml/m  AORTIC VALVE LVOT Vmax:   81.20 cm/s LVOT Vmean:  54.100 cm/s LVOT VTI:    0.157 m  AORTA Ao Root diam: 2.90 cm Ao Asc diam:  2.80 cm MITRAL VALVE MV Area (PHT): 3.27 cm    SHUNTS MV Decel Time: 232 msec    Systemic VTI:  0.16 m MV E velocity: 53.40 cm/s  Systemic Diam: 2.20 cm MV A velocity: 92.00 cm/s MV E/A ratio:  0.58 Kirk Ruths MD Electronically signed by Kirk Ruths MD Signature Date/Time: 04/26/2021/4:10:16 PM    Final     Results/Tests Pending  at Time of Discharge: none  Discharge Medications:  Allergies as of 04/26/2021   No Known Allergies      Medication List     STOP taking these medications    [employer] Athletes Foot 1 % cream Generic drug: terbinafine   tiZANidine 2 MG tablet Commonly known as: ZANAFLEX       TAKE these medications    ASPERCREME LIDOCAINE EX Apply 1 application topically daily as needed (muscle pain).   aspirin 81 MG EC tablet Take 1 tablet (81 mg total) by mouth daily. Swallow whole. Start taking on: April 27, 2021   atorvastatin 10 MG tablet Commonly known as: LIPITOR TAKE 1 TABLET (10 MG TOTAL) BY MOUTH DAILY AT 6 PM. What changed: when to take this   baclofen 20 MG tablet Commonly known as: LIORESAL TAKE 1 TABLET BY MOUTH 4 TIMES A DAY What changed:  how much to  take how to take this when to take this additional instructions   CALCIUM CARBONATE-VITAMIN D3 PO Take 1 tablet by mouth daily after lunch. '600mg'$ / 2500 IU, patient takes 2 tablets daily   carboxymethylcellulose 0.5 % Soln Commonly known as: REFRESH PLUS Place 1 drop into both eyes every morning.   cetirizine 10 MG tablet Commonly known as: ZYRTEC Take 10 mg by mouth every morning.   clopidogrel 75 MG tablet Commonly known as: PLAVIX TAKE 1 TABLET BY MOUTH EVERY DAY What changed: when to take this   diazepam 5 MG tablet Commonly known as: VALIUM Take 1 tablet (5 mg total) by mouth daily. Take 1 and a half tablet in the morning and 1 tablet at bedtime. What changed:  when to take this additional instructions   lisinopril 40 MG tablet Commonly known as: ZESTRIL Take 1 tablet (40 mg total) by mouth daily.   liver oil-zinc oxide 40 % ointment Commonly known as: DESITIN Apply 1 application topically at bedtime. Apply to buttocks   melatonin 5 MG Tabs Take 5 mg by mouth at bedtime.   metoprolol tartrate 50 MG tablet Commonly known as: LOPRESSOR Take 1 tablet (50 mg total) by mouth 2 (two) times daily.   multivitamin with minerals Tabs tablet Take 1 tablet by mouth every morning.   OVER THE COUNTER MEDICATION Apply 1 application topically 2 (two) times daily. Over the counter anti-itch cream   Oxcarbazepine 300 MG tablet Commonly known as: TRILEPTAL TAKE 1 TABLET BY MOUTH 2 TIMES DAILY.   oxyCODONE-acetaminophen 10-325 MG tablet Commonly known as: PERCOCET Take 1 tablet by mouth every 8 (eight) hours as needed for pain. What changed:  when to take this additional instructions   polyethylene glycol 17 g packet Commonly known as: MIRALAX / GLYCOLAX Take 17 g by mouth daily as needed (for constipation). Mix with liquid and drink        Discharge Instructions: Please refer to Patient Instructions section of EMR for full details.  Patient was counseled important  signs and symptoms that should prompt return to medical care, changes in medications, dietary instructions, activity restrictions, and follow up appointments.   Follow-Up Appointments:  Follow-up Information     Garvin Fila, MD. Schedule an appointment as soon as possible for a visit.   Specialties: Neurology, Radiology Contact information: [address] [place] [address] [phone]         Stacy Diaz, Stacy Halsted, MD. Schedule an appointment as soon as possible for a visit.   Specialty: Internal Medicine Contact information: [place]  [place] [address] [phone]                 Gladys Damme, MD 04/26/2021, 6:53 PM PGY-3, [place]

## 2021-04-26 NOTE — Care Management Obs Status (Signed)
[facility] NOTIFICATION   Patient Details  Name: MELAYNA RASPANTI MRN: [id number] Date of Birth: [birth year]   Medicare Observation Status Notification Given:  Yes  Verbal permission given for signature of notice due to remote  Verdell Carmine, RN 04/26/2021, 5:54 PM

## 2021-04-26 NOTE — Hospital Course (Addendum)
TIA 70 year old woman presented with left-sided weakness and facial drooping that started around 1 PM 9/9.  It completely resolved by about an hour after onset.  Of note patient has history of multiple TIAs in the past similar symptoms and time course.  Labs notable on admission included sodium to 125, glucose mildly elevated 111.  CTA head neck demonstrated no large vessel occlusion or hemodynamically significant stenosis.  Patient unable to perform MRI due to metal clips retained since craniotomy, s/p meningioma resection.  EKG notable for NSR, PVCs present with no acute ST changes.  UDS positive for benzodiazepine (patient on Valium at home as prescribed).  Restratification labs show total cholesterol appropriate at 145, HDL 66, LDL 67.  Patient is at goal and compliant with statin medication.  Hemoglobin A1c 5.2%, within normal limits.  Neurology examined patient and recommended aspirin 81 mg daily, clopidogrel 75 mg daily.  Permissive hypertension for the first 24 hours, and then restarted home medications.  Echo performed which showed borderline low left ventricular output, EF 40 to 45%, global hypokinesis, G1DD. She will require cardiology follow up for new HFrEF diagnosis. PT examined patient and recommended HH PT.  Hyponatremia Patient admitted with Na 125, improved today to 132.  Patient has history of chronic hyponatremia and is usually in the 130s.  Hypertension Blood pressure was mildly elevated to the 140s over 90s during admission.  Home medication was held for permissive hypertension for 24 hours and then restarted.  Hyperlipidemia Patient is compliant with statin, total cholesterol 145, HDL 66, LDL 67.  Continue statin  The rest of her problems were chronic and stable during this admission  Follow Up: Patient to continue DAPT ASA 81 mg and plavix 75 mg for one month, and then continue on ASA thereafter. Continue statin. Home Health PT ordered Follow up with cardiology for new  decreased LVEF on echo Follow up with Dr. Leonie Man, [facility] in 1 month Follow-up with PCP regarding hyponatremia

## 2021-04-26 NOTE — Care Management CC44 (Signed)
Verbal signature given for       Condition Code 44 Documentation Completed  Patient Details  Name: Stacy Diaz MRN: [id number] Date of Birth: 1950/09/08   Condition Code 44 given:  Yes Patient signature on Condition Code 44 notice:  Yes Documentation of 2 MD's agreement:  Yes Code 44 added to claim:  Yes  Verbal permission given for signature  for code 44 notice due to remote  Verdell Carmine, RN 04/26/2021, 5:55 PM

## 2021-04-26 NOTE — Progress Notes (Signed)
[facility] Rajni Holsworth A. Merlene Laughter, MD               Stacy Diaz is an 70 y.o. female.   Assessment/Plan: Recurrent left upper extremity weakness worrisome for TIA.  The patient is on dual antiplatelet agents.  Will recommend continuing this for 1 month and then switching back to single agent.  Continue with statin medication.  Follow-up echocardiography. 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 Low-grade UTI    The patient husband is at the bedside.  The history and chart and updated chart reviewed with them.  The patient apparently has had recurrent episodes of weakness on the left side lasting for about 5 minutes or so.  This time it lasted longer for about 15 minutes but resolved.  The husband reports that it was associated with unresponsiveness.  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  She has had history of quadriplegia ever since she has had craniotomy for meningioma in the late 1990s.  She did improve but over the years has gotten worse.  She did have an episode of left-sided weakness in the hospital and this was thought to be due to ischemic stroke.  She has been on Plavix in the outpatient setting.  Objective: Vital signs in last 24 hours: Temp:  [97.9 F (36.6 C)-98 F (36.7 C)] 98 F (36.7 C) (09/10 1157) Pulse Rate:  [72-115] 107 (09/10 1157) Resp:  [16-26] 16 (09/10 1130) BP: (124-188)/(67-103) 129/103 (09/10 1157) SpO2:  [95 %-100 %] 100 % (09/10 1130) Weight:  [88 kg] 88 kg (09/10 1157)   GENERAL: She is doing well at this time.  HEENT: Unremarkable  ABDOMEN: soft  EXTREMITIES: No edema   BACK: Normal  SKIN: Normal by inspection.    MENTAL STATUS: Alert and oriented. Speech, language and cognition are generally intact. Judgment and insight normal.   CRANIAL NERVES: Pupils are equal, round and reactive to light and accomodation; extra  ocular movements are full, there is no significant nystagmus; visual fields are full; upper and lower facial muscles are normal in strength and symmetric, there is no flattening of the nasolabial folds; tongue is midline; uvula is midline; shoulder elevation is normal.  MOTOR: Upper extremity both are graded as 4/5 proximally.  Distal 5.  Lower extremity 0/5 with increased tone.  COORDINATION: Left finger to nose is normal, right finger to nose is normal, No rest tremor; no intention tremor; no postural tremor; no bradykinesia.     Intake/Output from previous day: 09/09 0701 - 09/10 0700 In: -  Out: 525 [Urine:525] Intake/Output this shift: Total I/O In: 500 [P.O.:500] Out: 700 [Urine:700] Nutritional status:  Diet Order             Diet Heart Room service appropriate? Yes; Fluid consistency: Thin  Diet effective now                    Lab Results: Results for orders placed or performed during the hospital encounter of 04/25/21 (from the past 48 hour(s))  Resp Panel by RT-PCR (Flu A&B, Covid) Nasopharyngeal Swab     Status: None   Collection Time: 04/25/21  3:23 PM   Specimen: Nasopharyngeal Swab; Nasopharyngeal(NP) swabs in vial transport medium  Result Value Ref Range   SARS Coronavirus 2 by RT PCR NEGATIVE NEGATIVE    Comment: (NOTE) SARS-CoV-2 target nucleic acids are NOT DETECTED.  The  SARS-CoV-2 RNA is generally detectable in upper respiratory specimens during the acute phase of infection. The lowest concentration of SARS-CoV-2 viral copies this assay can detect is 138 copies/mL. A negative result does not preclude SARS-Cov-2 infection and should not be used as the sole basis for treatment or other patient management decisions. A negative result may occur with  improper specimen collection/handling, submission of specimen other than nasopharyngeal swab, presence of viral mutation(s) within the areas targeted by this assay, and inadequate number of  viral copies(<138 copies/mL). A negative result must be combined with clinical observations, patient history, and epidemiological information. The expected result is Negative.  Fact Sheet for Patients:  EntrepreneurPulse.com.au  Fact Sheet for Healthcare Providers:  IncredibleEmployment.be  This test is no t yet approved or cleared by the [place] FDA and  has been authorized for detection and/or diagnosis of SARS-CoV-2 by FDA under an Emergency Use Authorization (EUA). This EUA will remain  in effect (meaning this test can be used) for the duration of the COVID-19 declaration under Section 564(b)(1) of the Act, 21 U.S.C.section 360bbb-3(b)(1), unless the authorization is terminated  or revoked sooner.       Influenza A by PCR NEGATIVE NEGATIVE   Influenza B by PCR NEGATIVE NEGATIVE    Comment: (NOTE) The Xpert Xpress SARS-CoV-2/FLU/RSV plus assay is intended as an aid in the diagnosis of influenza from Nasopharyngeal swab specimens and should not be used as a sole basis for treatment. Nasal washings and aspirates are unacceptable for Xpert Xpress SARS-CoV-2/FLU/RSV testing.  Fact Sheet for Patients: EntrepreneurPulse.com.au  Fact Sheet for Healthcare Providers: IncredibleEmployment.be  This test is not yet approved or cleared by the [place] FDA and has been authorized for detection and/or diagnosis of SARS-CoV-2 by FDA under an Emergency Use Authorization (EUA). This EUA will remain in effect (meaning this test can be used) for the duration of the COVID-19 declaration under Section 564(b)(1) of the Act, 21 U.S.C. section 360bbb-3(b)(1), unless the authorization is terminated or revoked.  Performed at [facility], [place] [address] [place], [place] [address]   Ethanol     Status: None   Collection Time: 04/25/21  4:10 PM  Result Value Ref Range   Alcohol, Ethyl (B) <10 <10 mg/dL     Comment: (NOTE) Lowest detectable limit for serum alcohol is 10 mg/dL.  For medical purposes only. Performed at [facility], [place] [address] [place], [place] [address]   Protime-INR     Status: None   Collection Time: 04/25/21  4:10 PM  Result Value Ref Range   Prothrombin Time 12.9 11.4 - 15.2 seconds   INR 1.0 0.8 - 1.2    Comment: (NOTE) INR goal varies based on device and disease states. Performed at [facility], [place] [address], [place], [place] [address]   APTT     Status: None   Collection Time: 04/25/21  4:10 PM  Result Value Ref Range   aPTT 25 24 - 36 seconds    Comment: Performed at [facility] [address] [place] [address]  CBC     Status: Abnormal   Collection Time: 04/25/21  4:10 PM  Result Value Ref Range   WBC 9.4 4.0 - 10.5 K/uL   RBC 4.89 3.87 - 5.11 MIL/uL   Hemoglobin 15.2 (H) 12.0 - 15.0 g/dL   HCT 44.3 36.0 - 46.0 %   MCV 90.6 80.0 - 100.0 fL   MCH 31.1 26.0 -  34.0 pg   MCHC 34.3 30.0 - 36.0 g/dL   RDW 12.8 11.5 - 15.5 %   Platelets 214 150 - 400 K/uL   nRBC 0.0 0.0 - 0.2 %    Comment: Performed at [facility], [place] [address], [place], [place] [address]  Differential     Status: None   Collection Time: 04/25/21  4:10 PM  Result Value Ref Range   Neutrophils Relative % 72 %   Neutro Abs 6.7 1.7 - 7.7 K/uL   Lymphocytes Relative 20 %   Lymphs Abs 1.9 0.7 - 4.0 K/uL   Monocytes Relative 7 %   Monocytes Absolute 0.7 0.1 - 1.0 K/uL   Eosinophils Relative 1 %   Eosinophils Absolute 0.1 0.0 - 0.5 K/uL   Basophils Relative 0 %   Basophils Absolute 0.0 0.0 - 0.1 K/uL   Immature Granulocytes 0 %   Abs Immature Granulocytes 0.03 0.00 - 0.07 K/uL    Comment: Performed at [facility] [address], [place], [place] [address]  Comprehensive metabolic panel     Status: Abnormal   Collection Time: 04/25/21  4:10 PM  Result Value Ref Range   Sodium 125 (L) 135 - 145 mmol/L   Potassium 4.3 3.5 - 5.1 mmol/L    Chloride 92 (L) 98 - 111 mmol/L   CO2 22 22 - 32 mmol/L   Glucose, Bld 111 (H) 70 - 99 mg/dL    Comment: Glucose reference range applies only to samples taken after fasting for at least 8 hours.   BUN 7 (L) 8 - 23 mg/dL   Creatinine, Ser 0.54 0.44 - 1.00 mg/dL   Calcium 9.1 8.9 - 10.3 mg/dL   Total Protein 7.2 6.5 - 8.1 g/dL   Albumin 4.4 3.5 - 5.0 g/dL   AST 29 15 - 41 U/L   ALT 22 0 - 44 U/L   Alkaline Phosphatase 48 38 - 126 U/L   Total Bilirubin 0.9 0.3 - 1.2 mg/dL   GFR, Estimated >60 >60 mL/min    Comment: (NOTE) Calculated using the CKD-EPI Creatinine Equation (2021)    Anion gap 11 5 - 15    Comment: Performed at [facility] [address], [place], [place] [address]  ABO/Rh     Status: None   Collection Time: 04/25/21  4:10 PM  Result Value Ref Range   ABO/RH(D)      A POS Performed at [facility] [address], [place], [place] [address]   I-stat chem 8, ED     Status: Abnormal   Collection Time: 04/25/21  4:18 PM  Result Value Ref Range   Sodium 125 (L) 135 - 145 mmol/L   Potassium 4.3 3.5 - 5.1 mmol/L   Chloride 94 (L) 98 - 111 mmol/L   BUN 8 8 - 23 mg/dL   Creatinine, Ser 0.40 (L) 0.44 - 1.00 mg/dL   Glucose, Bld 112 (H) 70 - 99 mg/dL    Comment: Glucose reference range applies only to samples taken after fasting for at least 8 hours.   Calcium, Ion 1.05 (L) 1.15 - 1.40 mmol/L   TCO2 25 22 - 32 mmol/L   Hemoglobin 15.6 (H) 12.0 - 15.0 g/dL   HCT 46.0 36.0 - 46.0 %  Type and screen [place]     Status: None   Collection Time: 04/25/21  5:22 PM  Result Value Ref Range   ABO/RH(D) A POS  Antibody Screen NEG    Sample Expiration      04/28/2021,2359 Performed at [facility], [place] [address], [place], [place] [address]   Urine rapid drug screen (hosp performed)     Status: Abnormal   Collection Time: 04/25/21  6:52 PM  Result Value Ref Range   Opiates NONE DETECTED NONE DETECTED   Cocaine NONE DETECTED NONE  DETECTED   Benzodiazepines POSITIVE (A) NONE DETECTED   Amphetamines NONE DETECTED NONE DETECTED   Tetrahydrocannabinol NONE DETECTED NONE DETECTED   Barbiturates NONE DETECTED NONE DETECTED    Comment: (NOTE) DRUG SCREEN FOR MEDICAL PURPOSES ONLY.  IF CONFIRMATION IS NEEDED FOR ANY PURPOSE, NOTIFY LAB WITHIN 5 DAYS.  LOWEST DETECTABLE LIMITS FOR URINE DRUG SCREEN Drug Class                     Cutoff (ng/mL) Amphetamine and metabolites    1000 Barbiturate and metabolites    200 Benzodiazepine                 [id number] Tricyclics and metabolites     300 Opiates and metabolites        300 Cocaine and metabolites        300 THC                            50 Performed at [facility], [place] [address] [place], [place] [address]   Urinalysis, Routine w reflex microscopic     Status: Abnormal   Collection Time: 04/25/21  6:52 PM  Result Value Ref Range   Color, Urine YELLOW YELLOW   APPearance CLEAR CLEAR   Specific Gravity, Urine <1.005 (L) 1.005 - 1.030   pH 7.0 5.0 - 8.0   Glucose, UA NEGATIVE NEGATIVE mg/dL   Hgb urine dipstick TRACE (A) NEGATIVE   Bilirubin Urine NEGATIVE NEGATIVE   Ketones, ur NEGATIVE NEGATIVE mg/dL   Protein, ur NEGATIVE NEGATIVE mg/dL   Nitrite POSITIVE (A) NEGATIVE   Leukocytes,Ua LARGE (A) NEGATIVE    Comment: Performed at [facility] [address], [place], [place] [address]  Urinalysis, Microscopic (reflex)     Status: Abnormal   Collection Time: 04/25/21  6:52 PM  Result Value Ref Range   RBC / HPF 0-5 0 - 5 RBC/hpf   WBC, UA 21-50 0 - 5 WBC/hpf   Bacteria, UA FEW (A) NONE SEEN   Squamous Epithelial / LPF 0-5 0 - 5    Comment: Performed at [facility], [place] [address], [place], [place] [address]  CBC     Status: None   Collection Time: 04/26/21  2:29 AM  Result Value Ref Range   WBC 8.7 4.0 - 10.5 K/uL   RBC 4.81 3.87 - 5.11 MIL/uL   Hemoglobin 15.0 12.0 - 15.0 g/dL   HCT 42.8 36.0 - 46.0 %   MCV 89.0 80.0 - 100.0 fL   MCH  31.2 26.0 - 34.0 pg   MCHC 35.0 30.0 - 36.0 g/dL   RDW 12.8 11.5 - 15.5 %   Platelets 234 150 - 400 K/uL   nRBC 0.0 0.0 - 0.2 %    Comment: Performed at [facility], [place] [address] [place], [place] [address]  Hemoglobin A1c     Status: None   Collection Time: 04/26/21  2:29 AM  Result Value Ref Range   Hgb A1c MFr Bld 5.2  4.8 - 5.6 %    Comment: (NOTE) Pre diabetes:          5.7%-6.4%  Diabetes:              >6.4%  Glycemic control for   <7.0% adults with diabetes    Mean Plasma Glucose 102.54 mg/dL    Comment: Performed at [facility], [place] [address], [place], [place] [address]  TSH     Status: None   Collection Time: 04/26/21  2:29 AM  Result Value Ref Range   TSH 0.558 0.350 - 4.500 uIU/mL    Comment: Performed by a 3rd Generation assay with a functional sensitivity of <=0.01 uIU/mL. Performed at [facility], [place] [address] [place], [place] [address]   Lipid panel     Status: None   Collection Time: 04/26/21  2:29 AM  Result Value Ref Range   Cholesterol 145 0 - 200 mg/dL   Triglycerides 62 <150 mg/dL   HDL 66 >40 mg/dL   Total CHOL/HDL Ratio 2.2 RATIO   VLDL 12 0 - 40 mg/dL   LDL Cholesterol 67 0 - 99 mg/dL    Comment:        Total Cholesterol/HDL:CHD Risk Coronary Heart Disease Risk Table                     Men   Women  1/2 Average Risk   3.4   3.3  Average Risk       5.0   4.4  2 X Average Risk   9.6   7.1  3 X Average Risk  23.4   11.0        Use the calculated Patient Ratio above and the CHD Risk Table to determine the patient's CHD Risk.        ATP III CLASSIFICATION (LDL):  <100     mg/dL   Optimal  100-129  mg/dL   Near or Above                    Optimal  130-159  mg/dL   Borderline  160-189  mg/dL   High  >190     mg/dL   Very High Performed at  [address] [place], [place] [address]   Comprehensive metabolic panel     Status: Abnormal   Collection Time: 04/26/21  2:29 AM  Result Value Ref Range    Sodium 132 (L) 135 - 145 mmol/L   Potassium 3.6 3.5 - 5.1 mmol/L   Chloride 100 98 - 111 mmol/L   CO2 22 22 - 32 mmol/L   Glucose, Bld 98 70 - 99 mg/dL    Comment: Glucose reference range applies only to samples taken after fasting for at least 8 hours.   BUN 7 (L) 8 - 23 mg/dL   Creatinine, Ser 0.52 0.44 - 1.00 mg/dL   Calcium 9.7 8.9 - 10.3 mg/dL   Total Protein 6.6 6.5 - 8.1 g/dL   Albumin 4.0 3.5 - 5.0 g/dL   AST 19 15 - 41 U/L   ALT 20 0 - 44 U/L   Alkaline Phosphatase 46 38 - 126 U/L   Total Bilirubin 0.3 0.3 - 1.2 mg/dL   GFR, Estimated >60 >60 mL/min    Comment: (NOTE) Calculated using the CKD-EPI Creatinine Equation (2021)    Anion gap 10 5 - 15    Comment: Performed at [facility]  [address] [place], [place] [address]    Lipid Panel Recent Labs    04/26/21 0229  CHOL 145  TRIG 62  HDL 66  CHOLHDL 2.2  VLDL 12  LDLCALC 67    Studies/Results:  HEAD CT IMPRESSION: 1. No evidence of acute intracranial hemorrhage or infarct. 2. ASPECTS is 10 3. Stable postsurgical changes and encephalomalacia in the bilateral frontal and parietal lobes as above.   HEAD NECK CTA IMPRESSION: No large vessel occlusion or hemodynamically significant stenosis.      Medications:  Scheduled Meds:  aspirin EC  81 mg Oral Daily   atorvastatin  10 mg Oral q morning   baclofen  20 mg Oral QID   clopidogrel  75 mg Oral Daily   diazepam  5 mg Oral QHS   enoxaparin (LOVENOX) injection  40 mg Subcutaneous Q24H   [START ON 04/27/2021] influenza vaccine adjuvanted  0.5 mL Intramuscular Tomorrow-1000   lisinopril  40 mg Oral Daily   loratadine  10 mg Oral Daily   melatonin  5 mg Oral QHS   metoprolol tartrate  50 mg Oral BID   Oxcarbazepine  300 mg Oral BID   polyvinyl alcohol  1 drop Both Eyes q AM   senna  1 tablet Oral Daily   Continuous Infusions: PRN Meds:.lidocaine, oxyCODONE **AND** oxyCODONE-acetaminophen, polyethylene glycol     LOS: 1 day   Takeisha Cianci A.  Merlene Laughter, M.D.  Diplomate, Tax adviser of Psychiatry and Neurology ( Neurology).

## 2021-04-26 NOTE — Care Management (Addendum)
Patient is a condition code 27, will be DC later today. Called room no answer, called cell phone of patient and husband, no answer. Called floor %w to see if they could bridge the gap, RN will call back.  PT had recommended HH PT, [facility] ( Dr Chauncey Reading) for orders.  Patient phone now working notice given, patient does not remember previous agency for home health, want sone that works with insurance.

## 2021-04-27 NOTE — TOC Transition Note (Signed)
Transition of Care [facility]) - CM/SW Discharge Note   Patient Details  Name: Stacy Diaz MRN: [id number] Date of Birth: [birth year]  [facility] CM/SW Contact:  Verdell Carmine, RN Phone Number: 04/27/2021, 3:50 PM   Clinical Narrative:     Searching for [facility] PT, Called [name] declined, [facility], declined, and left message with [facility] weekend regarding acceptance for [facility]        Patient Goals and CMS Choice        Discharge Placement             home          Discharge Plan and Services                                     Social Determinants of Health (SDOH) Interventions     Readmission Risk Interventions No flowsheet data found.

## 2021-04-28 ENCOUNTER — Encounter: Payer: Self-pay | Admitting: Hematology and Oncology

## 2021-04-29 ENCOUNTER — Other Ambulatory Visit: Payer: Self-pay

## 2021-04-29 ENCOUNTER — Encounter: Payer: Self-pay | Admitting: Internal Medicine

## 2021-04-30 ENCOUNTER — Telehealth: Payer: Self-pay

## 2021-04-30 ENCOUNTER — Telehealth (INDEPENDENT_AMBULATORY_CARE_PROVIDER_SITE_OTHER): Payer: PPO | Admitting: Internal Medicine

## 2021-04-30 DIAGNOSIS — G8220 Paraplegia, unspecified: Secondary | ICD-10-CM | POA: Diagnosis not present

## 2021-04-30 DIAGNOSIS — Z09 Encounter for follow-up examination after completed treatment for conditions other than malignant neoplasm: Secondary | ICD-10-CM

## 2021-04-30 DIAGNOSIS — I5020 Unspecified systolic (congestive) heart failure: Secondary | ICD-10-CM | POA: Diagnosis not present

## 2021-04-30 DIAGNOSIS — G459 Transient cerebral ischemic attack, unspecified: Secondary | ICD-10-CM

## 2021-04-30 NOTE — Telephone Encounter (Signed)
[name] called requesting a call back regarding Stacy Diaz's appt

## 2021-04-30 NOTE — Telephone Encounter (Signed)
Responded via MyChart.

## 2021-04-30 NOTE — Addendum Note (Signed)
Addended by: Westley Hummer B on: 04/30/2021 05:01 PM   Modules accepted: Orders

## 2021-04-30 NOTE — Progress Notes (Signed)
Virtual Visit via Video Note  I connected with Stacy Diaz on 04/30/21 at  2:00 PM EDT by a video enabled telemedicine application and verified that I am speaking with the correct person using two identifiers.  Location patient: home Location provider: work office Persons participating in the virtual visit: patient, provider, husband  I discussed the limitations of evaluation and management by telemedicine and the availability of in person appointments. The patient expressed understanding and agreed to proceed.   HPI: This visit has been scheduled as a hospital follow-up.  It has been scheduled as a virtual due to her paraplegia and difficulty getting out of the house.  She was hospitalized from 04/25/2021-04/26/2021 for a TIA.  Her history significant for meningioma resection years ago.  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  She is wheelchair-bound.  She has multiple episodes of TIA.  Her neurologist is Dr. Leonie Man.  She is maintained on clopidogrel 75 mg daily.  The day of her admission she describes left arm numbness that proceeded up to her cheek and tongue.  Usually her TIAs will last about 10 minutes but when this 1 lasted longer, they called 911 at about minute 35.  ED referral was recommended.  While in the ED they did a CT angiogram that showed no evidence for CVA.  She is unable to have MRI due to metal clips left from her previous meningioma resection.  Within the work-up for her TIA she was found to have on echocardiogram a new heart failure with reduced ejection fraction with an EF of 40 to 45%, global hypokinesis and grade 1 diastolic dysfunction.  She is needing a referral to cardiology for this.  She is also requesting referral for home health aide.  She has home health PT that is starting later this week.  She has already secured follow-up with neurology.  She was advised to stay on dual antiplatelet therapy for at least 30 days.  Aspirin was added to her medication  list.   ROS: Constitutional: Denies fever, chills, diaphoresis, appetite change and fatigue.  HEENT: Denies photophobia, eye pain, redness, hearing loss, ear pain, congestion, sore throat, rhinorrhea, sneezing, mouth sores, trouble swallowing, neck pain, neck stiffness and tinnitus.   Respiratory: Denies SOB, DOE, cough, chest tightness,  and wheezing.   Cardiovascular: Denies chest pain, palpitations and leg swelling.  Gastrointestinal: Denies nausea, vomiting, abdominal pain, diarrhea, constipation, blood in stool and abdominal distention.  Genitourinary: Denies dysuria, urgency, frequency, hematuria, flank pain and difficulty urinating.  Endocrine: Denies: hot or cold intolerance, sweats, changes in hair or nails, polyuria, polydipsia. Musculoskeletal: Denies myalgias, back pain, joint swelling, arthralgias and gait problem.  Skin: Denies pallor, rash and wound.  Neurological: Denies dizziness, seizures, syncope, weakness, light-headedness, numbness and headaches.  Hematological: Denies adenopathy. Easy bruising, personal or family bleeding history  Psychiatric/Behavioral: Denies suicidal ideation, mood changes, confusion, nervousness, sleep disturbance and agitation   Past Medical History:  Diagnosis Date   Anxiety    Arthritis    "probably" (08/25/2012)   Breast cancer of lower-outer quadrant of left female breast ([place]) 05/25/2014   Headache(784.0)    "not real frequent" (08/25/2012)   Heart disease    HTN (hypertension)    Hyperlipidemia    Meningioma ([place]) 1999   Myocardial infarction ([place]) 1987   Paresis of lower extremity ([place]) 1999   BLE/notes 08/25/2012   S/P radiation therapy 07/30/14-08/30/13   left breast cancer/50Gy   Seizures ([place])  no seizures since 2014   Spastic paraparesis    S/P meningioma resection 1999/notes 08/25/2012   Stroke ([place])    x 2, the last 2014    Past Surgical History:  Procedure Laterality Date   BRAIN MENINGIOMA EXCISION  05/1997   BREAST  LUMPECTOMY WITH NEEDLE LOCALIZATION AND AXILLARY SENTINEL LYMPH NODE BX Left 06/22/2014   Procedure: LEFT WIRE LOCALIZED LUMPECTOPMY AND SENTINEL NODE MAPPING;  Surgeon: Autumn Messing III, MD;  Location: [facility];  Service: General;  Laterality: Left;   [place]   denied intervention: "blockage wasn't bad enough"   TIBIA IM NAIL INSERTION Right 04/26/2018   Procedure: INTRAMEDULLARY (IM) NAIL TIBIAL;  Surgeon: Altamese Deferiet, MD;  Location: [facility];  Service: Orthopedics;  Laterality: Right;    Family History  Problem Relation Age of Onset   Breast cancer Mother 88       unilateral   Heart disease Father    Breast cancer Paternal Grandmother 66       unilateral    SOCIAL HX:   reports that she has quit smoking. Her smoking use included cigarettes. She has a 20.00 pack-year smoking history. She has never used smokeless tobacco. She reports that she does not drink alcohol and does not use drugs.   Current Outpatient Medications:    ASPERCREME LIDOCAINE EX, Apply 1 application topically daily as needed (muscle pain)., Disp: , Rfl:    aspirin EC 81 MG EC tablet, Take 1 tablet (81 mg total) by mouth daily. Swallow whole., Disp: 30 tablet, Rfl: 1   atorvastatin (LIPITOR) 10 MG tablet, TAKE 1 TABLET (10 MG TOTAL) BY MOUTH DAILY AT 6 PM. (Patient taking differently: Take 10 mg by mouth every morning.), Disp: 90 tablet, Rfl: 1   baclofen (LIORESAL) 20 MG tablet, TAKE 1 TABLET BY MOUTH 4 TIMES A DAY (Patient taking differently: Take 20 mg by mouth 4 (four) times daily.), Disp: 360 tablet, Rfl: 1   Calcium Carb-Cholecalciferol (CALCIUM CARBONATE-VITAMIN D3 PO), Take 1 tablet by mouth daily after lunch. '600mg'$ / 2500 IU, patient takes 2 tablets daily, Disp: , Rfl:    carboxymethylcellulose (REFRESH PLUS) 0.5 % SOLN, Place 1 drop into both eyes every morning., Disp: , Rfl:    cetirizine (ZYRTEC) 10 MG tablet, Take 10 mg by mouth every morning., Disp: , Rfl:     clopidogrel (PLAVIX) 75 MG tablet, TAKE 1 TABLET BY MOUTH EVERY DAY (Patient taking differently: Take 75 mg by mouth every morning.), Disp: 90 tablet, Rfl: 1   diazepam (VALIUM) 5 MG tablet, Take 1 tablet (5 mg total) by mouth daily. Take 1 and a half tablet in the morning and 1 tablet at bedtime. (Patient taking differently: Take 5 mg by mouth daily.), Disp: 75 tablet, Rfl: 2   lisinopril (ZESTRIL) 40 MG tablet, Take 1 tablet (40 mg total) by mouth daily., Disp: 90 tablet, Rfl: 1   liver oil-zinc oxide (DESITIN) 40 % ointment, Apply 1 application topically at bedtime. Apply to buttocks, Disp: , Rfl:    melatonin 5 MG TABS, Take 5 mg by mouth at bedtime., Disp: , Rfl:    metoprolol tartrate (LOPRESSOR) 50 MG tablet, Take 1 tablet (50 mg total) by mouth 2 (two) times daily., Disp: 180 tablet, Rfl: 1   Multiple Vitamin (MULTIVITAMIN WITH MINERALS) TABS tablet, Take 1 tablet by mouth every morning. , Disp: , Rfl:    OVER THE COUNTER MEDICATION, Apply 1 application topically 2 (two) times  daily. Over the counter anti-itch cream, Disp: , Rfl:    Oxcarbazepine (TRILEPTAL) 300 MG tablet, TAKE 1 TABLET BY MOUTH 2 TIMES DAILY. (Patient taking differently: Take 300 mg by mouth 2 (two) times daily.), Disp: 180 tablet, Rfl: 1   oxyCODONE-acetaminophen (PERCOCET) 10-325 MG tablet, Take 1 tablet by mouth every 8 (eight) hours as needed for pain. (Patient taking differently: Take 1 tablet by mouth every 8 (eight) hours. scheduled), Disp: 270 tablet, Rfl: 0   polyethylene glycol (MIRALAX / GLYCOLAX) packet, Take 17 g by mouth daily as needed (for constipation). Mix with liquid and drink, Disp: , Rfl:   EXAM:   VITALS per patient if applicable: None reported  GENERAL: alert, oriented, appears well and in no acute distress  HEENT: atraumatic, conjunttiva clear, no obvious abnormalities on inspection of external nose and ears  NECK: normal movements of the head and neck  LUNGS: on inspection no signs of  respiratory distress, breathing rate appears normal, no obvious gross increased work of breathing, gasping or wheezing  CV: no obvious cyanosis  MS: moves all visible extremities without noticeable abnormality  PSYCH/NEURO: pleasant and cooperative, no obvious depression or anxiety, speech and thought processing grossly intact  ASSESSMENT AND PLAN:   Hospital discharge follow-up  Paraplegia ([place])  TIA (transient ischemic attack)  HFrEF (heart failure with reduced ejection fraction) ([place]) - Plan: Ambulatory referral to Cardiology  [facility] chart has been reviewed in great detail.  I agree with dual antiplatelet therapy.  She has follow-up with neurology next week to discuss.  I will initiate referral to cardiology given her new heart failure with reduced ejection fraction.  Her physical therapy starts this week.  I will place referral for home health aide as requested.  Time spent: 32 minutes reviewing chart, interviewing patient and husband, formulating plan of care.     I discussed the assessment and treatment plan with the patient. The patient was provided an opportunity to ask questions and all were answered. The patient agreed with the plan and demonstrated an understanding of the instructions.   The patient was advised to call back or seek an in-person evaluation if the symptoms worsen or if the condition fails to improve as anticipated.    Lelon Frohlich, MD  [facility]

## 2021-05-02 ENCOUNTER — Telehealth: Payer: Self-pay | Admitting: Internal Medicine

## 2021-05-02 DIAGNOSIS — G8220 Paraplegia, unspecified: Secondary | ICD-10-CM | POA: Diagnosis not present

## 2021-05-02 DIAGNOSIS — I110 Hypertensive heart disease with heart failure: Secondary | ICD-10-CM | POA: Diagnosis not present

## 2021-05-02 DIAGNOSIS — Z87440 Personal history of urinary (tract) infections: Secondary | ICD-10-CM | POA: Diagnosis not present

## 2021-05-02 DIAGNOSIS — T402X5D Adverse effect of other opioids, subsequent encounter: Secondary | ICD-10-CM | POA: Diagnosis not present

## 2021-05-02 DIAGNOSIS — K5909 Other constipation: Secondary | ICD-10-CM | POA: Diagnosis not present

## 2021-05-02 DIAGNOSIS — F419 Anxiety disorder, unspecified: Secondary | ICD-10-CM | POA: Diagnosis not present

## 2021-05-02 DIAGNOSIS — Z853 Personal history of malignant neoplasm of breast: Secondary | ICD-10-CM | POA: Diagnosis not present

## 2021-05-02 DIAGNOSIS — Z7902 Long term (current) use of antithrombotics/antiplatelets: Secondary | ICD-10-CM | POA: Diagnosis not present

## 2021-05-02 DIAGNOSIS — Z86011 Personal history of benign neoplasm of the brain: Secondary | ICD-10-CM | POA: Diagnosis not present

## 2021-05-02 DIAGNOSIS — Z8673 Personal history of transient ischemic attack (TIA), and cerebral infarction without residual deficits: Secondary | ICD-10-CM | POA: Diagnosis not present

## 2021-05-02 DIAGNOSIS — G40209 Localization-related (focal) (partial) symptomatic epilepsy and epileptic syndromes with complex partial seizures, not intractable, without status epilepticus: Secondary | ICD-10-CM | POA: Diagnosis not present

## 2021-05-02 DIAGNOSIS — G8320 Monoplegia of upper limb affecting unspecified side: Secondary | ICD-10-CM | POA: Diagnosis not present

## 2021-05-02 DIAGNOSIS — E871 Hypo-osmolality and hyponatremia: Secondary | ICD-10-CM | POA: Diagnosis not present

## 2021-05-02 DIAGNOSIS — Z87891 Personal history of nicotine dependence: Secondary | ICD-10-CM | POA: Diagnosis not present

## 2021-05-02 DIAGNOSIS — I2510 Atherosclerotic heart disease of native coronary artery without angina pectoris: Secondary | ICD-10-CM | POA: Diagnosis not present

## 2021-05-02 DIAGNOSIS — I493 Ventricular premature depolarization: Secondary | ICD-10-CM | POA: Diagnosis not present

## 2021-05-02 DIAGNOSIS — Z7982 Long term (current) use of aspirin: Secondary | ICD-10-CM | POA: Diagnosis not present

## 2021-05-02 DIAGNOSIS — I252 Old myocardial infarction: Secondary | ICD-10-CM | POA: Diagnosis not present

## 2021-05-02 DIAGNOSIS — M1990 Unspecified osteoarthritis, unspecified site: Secondary | ICD-10-CM | POA: Diagnosis not present

## 2021-05-02 DIAGNOSIS — E785 Hyperlipidemia, unspecified: Secondary | ICD-10-CM | POA: Diagnosis not present

## 2021-05-02 DIAGNOSIS — I5020 Unspecified systolic (congestive) heart failure: Secondary | ICD-10-CM | POA: Diagnosis not present

## 2021-05-02 DIAGNOSIS — M810 Age-related osteoporosis without current pathological fracture: Secondary | ICD-10-CM | POA: Diagnosis not present

## 2021-05-02 NOTE — Telephone Encounter (Signed)
[facility], [phone], called to request verbal orders for a PT. She would like orders to see the PT twice a week for 6 weeks for home health physical therapy orders. Please advise.

## 2021-05-02 NOTE — Telephone Encounter (Signed)
Mickel Baas from [facility] call and stated she need the NPI and tax ID # she also stated she need to know if pt have been in the hospital or rehab in the last 30 days.Mickel Baas 's # is [phone].

## 2021-05-06 ENCOUNTER — Telehealth: Payer: Self-pay | Admitting: Neurology

## 2021-05-06 ENCOUNTER — Ambulatory Visit: Payer: PPO | Admitting: Neurology

## 2021-05-06 ENCOUNTER — Encounter: Payer: Self-pay | Admitting: Neurology

## 2021-05-06 ENCOUNTER — Other Ambulatory Visit: Payer: Self-pay

## 2021-05-06 ENCOUNTER — Telehealth: Payer: Self-pay | Admitting: *Deleted

## 2021-05-06 VITALS — BP 179/93 | HR 60

## 2021-05-06 DIAGNOSIS — G459 Transient cerebral ischemic attack, unspecified: Secondary | ICD-10-CM | POA: Diagnosis not present

## 2021-05-06 DIAGNOSIS — G40109 Localization-related (focal) (partial) symptomatic epilepsy and epileptic syndromes with simple partial seizures, not intractable, without status epilepticus: Secondary | ICD-10-CM | POA: Diagnosis not present

## 2021-05-06 DIAGNOSIS — R569 Unspecified convulsions: Secondary | ICD-10-CM

## 2021-05-06 MED ORDER — OXCARBAZEPINE 300 MG PO TABS: 300.0000 mg | ORAL_TABLET | Freq: Two times a day (BID) | ORAL | 1 refills | Status: DC

## 2021-05-06 NOTE — Telephone Encounter (Signed)
EEG order sent to [facility] for scheduling.

## 2021-05-06 NOTE — Telephone Encounter (Signed)
FYI. Patient was seen today. CBC, BMP and carbamazepine labs ordered. Per the lab technician, she was unable to obtain enough blood to complete all three tests. She had an ample amount for [facility] process the carbamazepine test. She requested the patient come back tomorrow to try again. The patient declined. She has recent CMP and CBC on 04/26/21.

## 2021-05-06 NOTE — Progress Notes (Signed)
[facility] [address] [place]. [place] [address] [phone]       OFFICE CONSULT NOTE  Ms. [name] Date of Birth:  [birth year] Medical Record Number:  [id number]   Referring MD: Gypsy Balsam  Reason for Referral: TIA  HPI: Ms. Shvartsman is a 70 year old Caucasian lady seen for an office consultation visit today.  She is accompanied by her husband [name].  History is obtained from them and review of electronic medical records and personally reviewed available pertinent imaging films in PACS.  She has a past medical history of TIAs, stroke, meningioma s/p resection and October 1998 with residual paraplegia and left arm weakness and wheelchair-bound.  She presented on 04/25/2021 with sudden onset of left upper extremity paresthesias followed by weakness and facial droop.  She states that these episodes are recurrent and have been happening since 2014.  They occur at a frequency of once every 2 3 months.  Previous episodes were quite transient but they are 1 recently was more severe and lasted longer Came to the ER.  Following these episodes she appears tired and sleepy.  She reports numbness starting in the hands and gradually over several minutes spreading up the forearm into her arm and eventually her face.  She is awake during this event but does feel tired and lethargic after it is over.  There are no obvious triggers.  She denies any such episodes involving the right side of the body or face.  She denies any headache before during or after these episodes.  She in fact was seen by me for these episodes in 2014 and initially thought to be TIAs but subsequently started on Keppra for focal seizures and then switched to Trileptal.  She is currently on Trileptal 300 mg twice daily but still having these episodes.  She is tolerating it well without any side effects.  ROS:   14 system review of systems is positive for weakness, numbness, paraplegia, difficulty walking all  other systems negative  PMH:  Past Medical History:  Diagnosis Date   Anxiety    Arthritis    "probably" (08/25/2012)   Breast cancer of lower-outer quadrant of left female breast ([place]) 05/25/2014   Headache(784.0)    "not real frequent" (08/25/2012)   Heart disease    HTN (hypertension)    Hyperlipidemia    Meningioma ([place]) 1999   Myocardial infarction ([employer]) 1987   Paresis of lower extremity ([place]) 1999   BLE/notes 08/25/2012   S/P radiation therapy 07/30/14-08/30/13   left breast cancer/50Gy   Seizures ([place])    no seizures since 2014   Spastic paraparesis    S/P meningioma resection 1999/notes 08/25/2012   Stroke ([place])    x 2, the last 2014    Social History:  Social History   Socioeconomic History   Marital status: Married    Spouse name: david   Number of children: 2   Years of education: college   Highest education level: Not on file  Occupational History   Occupation: disabled  Tobacco Use   Smoking status: Former    Packs/day: 2.00    Years: 10.00    Pack years: 20.00    Types: Cigarettes   Smokeless tobacco: Never   Tobacco comments:    08/25/2012 "quit smoking cigarettes in 1985 when I had my heart attack"  Vaping Use   Vaping Use: Never used  Substance and Sexual Activity   Alcohol use: No    Alcohol/week: 7.0 standard drinks  Types: 7 Glasses of wine per week    Comment: occasionally   Drug use: No   Sexual activity: Never  Other Topics Concern   Not on file  Social History Narrative   Not on file   Social Determinants of Health   Financial Resource Strain: Not on file  Food Insecurity: Not on file  Transportation Needs: Not on file  Physical Activity: Not on file  Stress: Not on file  Social Connections: Not on file  Intimate Partner Violence: Not on file    Medications:   Current Outpatient Medications on File Prior to Visit  Medication Sig Dispense Refill   ASPERCREME LIDOCAINE EX Apply 1 application topically daily as needed (muscle  pain).     atorvastatin (LIPITOR) 10 MG tablet TAKE 1 TABLET (10 MG TOTAL) BY MOUTH DAILY AT 6 PM. (Patient taking differently: Take 10 mg by mouth every morning.) 90 tablet 1   baclofen (LIORESAL) 20 MG tablet TAKE 1 TABLET BY MOUTH 4 TIMES A DAY (Patient taking differently: Take 20 mg by mouth 4 (four) times daily.) 360 tablet 1   Calcium Carb-Cholecalciferol (CALCIUM CARBONATE-VITAMIN D3 PO) Take 1 tablet by mouth daily after lunch. 600mg / 2500 IU, patient takes 2 tablets daily     carboxymethylcellulose (REFRESH PLUS) 0.5 % SOLN Place 1 drop into both eyes every morning.     cetirizine (ZYRTEC) 10 MG tablet Take 10 mg by mouth every morning.     clopidogrel (PLAVIX) 75 MG tablet TAKE 1 TABLET BY MOUTH EVERY DAY (Patient taking differently: Take 75 mg by mouth every morning.) 90 tablet 1   diazepam (VALIUM) 5 MG tablet Take 1 tablet (5 mg total) by mouth daily. Take 1 and a half tablet in the morning and 1 tablet at bedtime. (Patient taking differently: Take 5 mg by mouth daily.) 75 tablet 2   lisinopril (ZESTRIL) 40 MG tablet Take 1 tablet (40 mg total) by mouth daily. 90 tablet 1   liver oil-zinc oxide (DESITIN) 40 % ointment Apply 1 application topically at bedtime. Apply to buttocks     melatonin 5 MG TABS Take 5 mg by mouth at bedtime.     metoprolol tartrate (LOPRESSOR) 50 MG tablet Take 1 tablet (50 mg total) by mouth 2 (two) times daily. 180 tablet 1   Multiple Vitamin (MULTIVITAMIN WITH MINERALS) TABS tablet Take 1 tablet by mouth every morning.      OVER THE COUNTER MEDICATION Apply 1 application topically 2 (two) times daily. Over the counter anti-itch cream     oxyCODONE-acetaminophen (PERCOCET) 10-325 MG tablet Take 1 tablet by mouth every 8 (eight) hours as needed for pain. (Patient taking differently: Take 1 tablet by mouth every 8 (eight) hours. scheduled) 270 tablet 0   polyethylene glycol (MIRALAX / GLYCOLAX) packet Take 17 g by mouth daily as needed (for constipation). Mix with  liquid and drink     [DISCONTINUED] atenolol (TENORMIN) 50 MG tablet TAKE 1 & 1/2 TABLET BY MOUTH TWICE DAILY **NEED OFFICE VISIT 270 tablet 1   No current facility-administered medications on file prior to visit.    Allergies:  No Known Allergies  Physical Exam General: well developed, well nourished, elderly Caucasian lady seated, in no evident distress.  Old craniotomy scar on the vertex. Head: head normocephalic and atraumatic.   Neck: supple with no carotid or supraclavicular bruits Cardiovascular: regular rate and rhythm, no murmurs Musculoskeletal: no deformity Skin:  no rash/petichiae Vascular:  Normal pulses all extremities 1+ pedal edema  bilaterally  Neurologic Exam Mental Status: Awake and fully alert. Oriented to place and time. Recent and remote memory intact. Attention span, concentration and fund of knowledge appropriate. Mood and affect appropriate.  Cranial Nerves: Fundoscopic exam reveals sharp disc margins. Pupils equal, briskly reactive to light. Extraocular movements full without nystagmus. Visual fields full to confrontation. Hearing intact. Facial sensation intact. Face, tongue, palate moves normally and symmetrically.  Motor: Normal strength tone reflex in the right upper extremity.  Mild weakness of left upper extremity 4/5 with weakness of left grip intrinsic hand muscles and left triceps and shoulder elevators.  Bilateral paraplegia with 0/5 strength in both legs. Sensory.: intact to touch , pinprick , position and vibratory sensation.  Coordination: Rapid alternating movements normal in all extremities. Finger-to-nose and heel-to-shin performed accurately bilaterally. Gait and Station: Arises from chair without difficulty. Stance is normal. Gait demonstrates normal stride length and balance . Able to heel, toe and tandem walk without difficulty.  Reflexes: 2+ and symmetric.  And brisker in the lower extremities than upper extremities.  Toes downgoing.   NIHSS   9 Modified [place]in  4   ASSESSMENT: 70 year old lady with chronic paraplegia following meningioma surgery 1998 with recurrent stereotypical episodes of left upper extremity paresthesias and weakness of unclear etiology TIAs versus partial seizures.  These appear to be suboptimally controlled on current medication regimen of Trileptal     PLAN: I had a long discussion with the patient and her husband regarding her recurrent stereotypical episodes of left hand paresthesias and weakness and discussed differential diagnosis including partial seizures more likely compared to TIA.  I recommend increasing Trileptal dose to 300 mg in the morning and 600 mg at night and check carbamazepine level, CBC and BMP today.  Discontinue aspirin and stay on Plavix alone for stroke prevention and maintain aggressive risk factor modification with strict control of hypertension blood pressure goal below 130/90, lipids with LDL cholesterol goal below 70 mg percent and diabetes with hemoglobin A1c goal below 6.5%.  She will return for follow-up in 6 months or call earlier if necessary.  Greater than 50% time during this 45-minute consultation visit was spent on counseling and coordination of care and about TIA versus seizures and answering questions Antony Contras MD Note: This document was prepared with digital dictation and possible smart phrase technology. Any transcriptional errors that result from this process are unintentional.

## 2021-05-06 NOTE — Patient Instructions (Signed)
I had a long discussion with the patient and her husband regarding her recurrent stereotypical episodes of left hand paresthesias and weakness and discussed differential diagnosis including partial seizures more likely compared to TIA.  I recommend increasing Trileptal dose to 300 mg in the morning and 600 mg at night and check carbamazepine level, CBC and BMP today.  Discontinue aspirin and stay on Plavix alone for stroke prevention and maintain aggressive risk factor modification with strict control of hypertension blood pressure goal below 130/90, lipids with LDL cholesterol goal below 70 mg percent and diabetes with hemoglobin A1c goal below 6.5%.  She will return for follow-up in 6 months or call earlier if necessary.

## 2021-05-06 NOTE — Telephone Encounter (Signed)
Left message on machine for [facility] to return our call.

## 2021-05-07 ENCOUNTER — Telehealth: Payer: Self-pay | Admitting: *Deleted

## 2021-05-07 NOTE — Telephone Encounter (Signed)
-----   Message from Garvin Fila, MD sent at 05/07/2021  5:00 PM EDT ----- Kindly inform the patient that carbamazepine level was low and to increase the dose as I have instructed already ----- Message ----- From: Interface, [facility] Lab Results In Sent: 05/07/2021   7:37 AM EDT To: Garvin Fila, MD

## 2021-05-07 NOTE — Telephone Encounter (Signed)
Attempted to call but got voicemail.

## 2021-05-07 NOTE — Telephone Encounter (Signed)
I spoke to the patient's husband. He verbalized understanding of the lab results and Dr. Clydene Fake instructions.

## 2021-05-09 LAB — BASIC METABOLIC PANEL

## 2021-05-09 LAB — CARBAMAZEPINE LEVEL, TOTAL: Carbamazepine (Tegretol), S: <2 ug/mL — ABNORMAL LOW (ref 4.0–12.0)

## 2021-05-09 NOTE — Telephone Encounter (Signed)
Stacy Diaz is out of the office until 05/12/21.

## 2021-05-09 NOTE — Telephone Encounter (Signed)
Verbal orders given  

## 2021-05-13 ENCOUNTER — Encounter: Payer: Self-pay | Admitting: Internal Medicine

## 2021-05-13 ENCOUNTER — Telehealth: Payer: Self-pay

## 2021-05-13 NOTE — Telephone Encounter (Signed)
Son of patient called stating Patient's B/P is reading 162/91 and wants to discuss I informed that PCP is out of the office and transferred to a triage nurse.

## 2021-05-13 NOTE — Telephone Encounter (Signed)
Appointment scheduled.

## 2021-05-13 NOTE — Telephone Encounter (Signed)
Spoke with husband and an appointment scheduled

## 2021-05-13 NOTE — Telephone Encounter (Signed)
Information given as requested.

## 2021-05-14 ENCOUNTER — Telehealth (INDEPENDENT_AMBULATORY_CARE_PROVIDER_SITE_OTHER): Payer: PPO | Admitting: Internal Medicine

## 2021-05-14 ENCOUNTER — Encounter: Payer: Self-pay | Admitting: Internal Medicine

## 2021-05-14 VITALS — BP 161/83 | Wt 173.0 lb

## 2021-05-14 DIAGNOSIS — G8220 Paraplegia, unspecified: Secondary | ICD-10-CM | POA: Diagnosis not present

## 2021-05-14 DIAGNOSIS — I2510 Atherosclerotic heart disease of native coronary artery without angina pectoris: Secondary | ICD-10-CM | POA: Diagnosis not present

## 2021-05-14 DIAGNOSIS — I110 Hypertensive heart disease with heart failure: Secondary | ICD-10-CM | POA: Diagnosis not present

## 2021-05-14 DIAGNOSIS — Z7902 Long term (current) use of antithrombotics/antiplatelets: Secondary | ICD-10-CM | POA: Diagnosis not present

## 2021-05-14 DIAGNOSIS — I10 Essential (primary) hypertension: Secondary | ICD-10-CM | POA: Diagnosis not present

## 2021-05-14 DIAGNOSIS — Z86011 Personal history of benign neoplasm of the brain: Secondary | ICD-10-CM | POA: Diagnosis not present

## 2021-05-14 DIAGNOSIS — E871 Hypo-osmolality and hyponatremia: Secondary | ICD-10-CM | POA: Diagnosis not present

## 2021-05-14 DIAGNOSIS — Z8673 Personal history of transient ischemic attack (TIA), and cerebral infarction without residual deficits: Secondary | ICD-10-CM | POA: Diagnosis not present

## 2021-05-14 DIAGNOSIS — Z7982 Long term (current) use of aspirin: Secondary | ICD-10-CM | POA: Diagnosis not present

## 2021-05-14 DIAGNOSIS — M810 Age-related osteoporosis without current pathological fracture: Secondary | ICD-10-CM | POA: Diagnosis not present

## 2021-05-14 DIAGNOSIS — G40209 Localization-related (focal) (partial) symptomatic epilepsy and epileptic syndromes with complex partial seizures, not intractable, without status epilepticus: Secondary | ICD-10-CM | POA: Diagnosis not present

## 2021-05-14 DIAGNOSIS — M1990 Unspecified osteoarthritis, unspecified site: Secondary | ICD-10-CM | POA: Diagnosis not present

## 2021-05-14 DIAGNOSIS — T402X5D Adverse effect of other opioids, subsequent encounter: Secondary | ICD-10-CM | POA: Diagnosis not present

## 2021-05-14 DIAGNOSIS — Z87440 Personal history of urinary (tract) infections: Secondary | ICD-10-CM | POA: Diagnosis not present

## 2021-05-14 DIAGNOSIS — G8320 Monoplegia of upper limb affecting unspecified side: Secondary | ICD-10-CM | POA: Diagnosis not present

## 2021-05-14 DIAGNOSIS — Z853 Personal history of malignant neoplasm of breast: Secondary | ICD-10-CM | POA: Diagnosis not present

## 2021-05-14 DIAGNOSIS — I493 Ventricular premature depolarization: Secondary | ICD-10-CM | POA: Diagnosis not present

## 2021-05-14 DIAGNOSIS — K5909 Other constipation: Secondary | ICD-10-CM | POA: Diagnosis not present

## 2021-05-14 DIAGNOSIS — F419 Anxiety disorder, unspecified: Secondary | ICD-10-CM | POA: Diagnosis not present

## 2021-05-14 DIAGNOSIS — I252 Old myocardial infarction: Secondary | ICD-10-CM | POA: Diagnosis not present

## 2021-05-14 DIAGNOSIS — I5020 Unspecified systolic (congestive) heart failure: Secondary | ICD-10-CM | POA: Diagnosis not present

## 2021-05-14 DIAGNOSIS — E785 Hyperlipidemia, unspecified: Secondary | ICD-10-CM | POA: Diagnosis not present

## 2021-05-14 DIAGNOSIS — Z87891 Personal history of nicotine dependence: Secondary | ICD-10-CM | POA: Diagnosis not present

## 2021-05-14 NOTE — Progress Notes (Signed)
Virtual Visit via Video Note  I connected with Stacy Diaz on 05/14/21 at 10:30 AM EDT by a video enabled telemedicine application and verified that I am speaking with the correct person using two identifiers.  Location patient: home Location provider: work office Persons participating in the virtual visit: patient, provider, husband  I discussed the limitations of evaluation and management by telemedicine and the availability of in person appointments. The patient expressed understanding and agreed to proceed.   HPI: Miyu has scheduled this visit to discuss elevated blood pressure readings.  She does have a history of hypertension and prior CVA.  3 days ago her blood pressure was 191/103, over the subsequent days it has decreased to 159/91, 150/80.  She has had no headache, no chest pain, no shortness of breath, no focal neurologic deficits above her baseline.  She does have frequent TIAs but none in the past few days.  She is currently on lisinopril 40 mg daily and metoprolol 50 mg twice daily.  She is adherent to medical therapy.  She tells me that she is currently under a lot of stress due to some medical issues pertaining to her grandson.   ROS: Constitutional: Denies fever, chills, diaphoresis, appetite change and fatigue.  HEENT: Denies photophobia, eye pain, redness, hearing loss, ear pain, congestion, sore throat, rhinorrhea, sneezing, mouth sores, trouble swallowing, neck pain, neck stiffness and tinnitus.   Respiratory: Denies SOB, DOE, cough, chest tightness,  and wheezing.   Cardiovascular: Denies chest pain, palpitations and leg swelling.  Gastrointestinal: Denies nausea, vomiting, abdominal pain, diarrhea, constipation, blood in stool and abdominal distention.  Genitourinary: Denies dysuria, urgency, frequency, hematuria, flank pain and difficulty urinating.  Endocrine: Denies: hot or cold intolerance, sweats, changes in hair or nails, polyuria,  polydipsia. Musculoskeletal: Denies myalgias, back pain, joint swelling, arthralgias and gait problem.  Skin: Denies pallor, rash and wound.  Neurological: Denies weakness, light-headedness, numbness and headaches.  Hematological: Denies adenopathy. Easy bruising, personal or family bleeding history  Psychiatric/Behavioral: Denies suicidal ideation, mood changes, confusion, nervousness, sleep disturbance and agitation   Past Medical History:  Diagnosis Date   Anxiety    Arthritis    "probably" (08/25/2012)   Breast cancer of lower-outer quadrant of left female breast ([place]) 05/25/2014   Headache(784.0)    "not real frequent" (08/25/2012)   Heart disease    HTN (hypertension)    Hyperlipidemia    Meningioma ([place]) 1999   Myocardial infarction ([place]) 1987   Paresis of lower extremity ([place]) 1999   BLE/notes 08/25/2012   S/P radiation therapy 07/30/14-08/30/13   left breast cancer/50Gy   Seizures ([place])    no seizures since 2014   Spastic paraparesis    S/P meningioma resection 1999/notes 08/25/2012   Stroke ([place])    x 2, the last 2014    Past Surgical History:  Procedure Laterality Date   BRAIN MENINGIOMA EXCISION  05/1997   BREAST LUMPECTOMY WITH NEEDLE LOCALIZATION AND AXILLARY SENTINEL LYMPH NODE BX Left 06/22/2014   Procedure: LEFT [place];  Surgeon: Autumn Messing III, MD;  Location: [facility];  Service: General;  Laterality: Left;   [place]   denied intervention: "blockage wasn't bad enough"   TIBIA IM NAIL INSERTION Right 04/26/2018   Procedure: INTRAMEDULLARY (IM) NAIL TIBIAL;  Surgeon: Altamese [place], MD;  Location: [facility];  Service: Orthopedics;  Laterality: Right;    Family History  Problem Relation Age  of Onset   Breast cancer Mother 80       unilateral   Heart disease Father    Breast cancer Paternal Grandmother 85       unilateral    SOCIAL HX:   reports that she has quit smoking.  Her smoking use included cigarettes. She has a 20.00 pack-year smoking history. She has never used smokeless tobacco. She reports that she does not drink alcohol and does not use drugs.   Current Outpatient Medications:    ASPERCREME LIDOCAINE EX, Apply 1 application topically daily as needed (muscle pain)., Disp: , Rfl:    atorvastatin (LIPITOR) 10 MG tablet, TAKE 1 TABLET (10 MG TOTAL) BY MOUTH DAILY AT 6 PM. (Patient taking differently: Take 10 mg by mouth every morning.), Disp: 90 tablet, Rfl: 1   baclofen (LIORESAL) 20 MG tablet, TAKE 1 TABLET BY MOUTH 4 TIMES A DAY (Patient taking differently: Take 20 mg by mouth 4 (four) times daily.), Disp: 360 tablet, Rfl: 1   Calcium Carb-Cholecalciferol (CALCIUM CARBONATE-VITAMIN D3 PO), Take 1 tablet by mouth daily after lunch. 600mg / 2500 IU, patient takes 2 tablets daily, Disp: , Rfl:    carboxymethylcellulose (REFRESH PLUS) 0.5 % SOLN, Place 1 drop into both eyes every morning., Disp: , Rfl:    cetirizine (ZYRTEC) 10 MG tablet, Take 10 mg by mouth every morning., Disp: , Rfl:    clopidogrel (PLAVIX) 75 MG tablet, TAKE 1 TABLET BY MOUTH EVERY DAY (Patient taking differently: Take 75 mg by mouth every morning.), Disp: 90 tablet, Rfl: 1   diazepam (VALIUM) 5 MG tablet, Take 1 tablet (5 mg total) by mouth daily. Take 1 and a half tablet in the morning and 1 tablet at bedtime. (Patient taking differently: Take 5 mg by mouth daily.), Disp: 75 tablet, Rfl: 2   lisinopril (ZESTRIL) 40 MG tablet, Take 1 tablet (40 mg total) by mouth daily., Disp: 90 tablet, Rfl: 1   liver oil-zinc oxide (DESITIN) 40 % ointment, Apply 1 application topically at bedtime. Apply to buttocks, Disp: , Rfl:    melatonin 5 MG TABS, Take 5 mg by mouth at bedtime., Disp: , Rfl:    metoprolol tartrate (LOPRESSOR) 50 MG tablet, Take 1 tablet (50 mg total) by mouth 2 (two) times daily., Disp: 180 tablet, Rfl: 1   Multiple Vitamin (MULTIVITAMIN WITH MINERALS) TABS tablet, Take 1 tablet by  mouth every morning. , Disp: , Rfl:    OVER THE COUNTER MEDICATION, Apply 1 application topically 2 (two) times daily. Over the counter anti-itch cream, Disp: , Rfl:    Oxcarbazepine (TRILEPTAL) 300 MG tablet, Take 1 tablet (300 mg total) by mouth 2 (two) times daily. Take 300 mg in am and 600 mg at night, Disp: 180 tablet, Rfl: 1   oxyCODONE-acetaminophen (PERCOCET) 10-325 MG tablet, Take 1 tablet by mouth every 8 (eight) hours as needed for pain. (Patient taking differently: Take 1 tablet by mouth every 8 (eight) hours. scheduled), Disp: 270 tablet, Rfl: 0   polyethylene glycol (MIRALAX / GLYCOLAX) packet, Take 17 g by mouth daily as needed (for constipation). Mix with liquid and drink, Disp: , Rfl:   EXAM:   VITALS per patient if applicable:, None reported  GENERAL: alert, oriented, appears well and in no acute distress  HEENT: atraumatic, conjunttiva clear, no obvious abnormalities on inspection of external nose and ears, wears corrective lenses  NECK: normal movements of the head and neck  LUNGS: on inspection no signs of respiratory distress, breathing rate appears  normal, no obvious gross increased work of breathing, gasping or wheezing  CV: no obvious cyanosis  MS: moves all visible extremities without noticeable abnormality  PSYCH/NEURO: pleasant and cooperative, no obvious depression or anxiety, speech and thought processing grossly intact  ASSESSMENT AND PLAN:   Essential hypertension -Blood pressure is already improving from a high of 191/103 3 days ago. -Given lack of focal neurologic symptoms, I believe it is reasonable to observe her over the course of the next 7 to 10 days during which time she will keep records of her blood pressure.  She will reach out to me in that timeframe to determine if augmentation of her antihypertensive regimen is indicated.    I discussed the assessment and treatment plan with the patient. The patient was provided an opportunity to ask  questions and all were answered. The patient agreed with the plan and demonstrated an understanding of the instructions.   The patient was advised to call back or seek an in-person evaluation if the symptoms worsen or if the condition fails to improve as anticipated.    Lelon Frohlich, MD  [facility] at [facility]

## 2021-05-22 ENCOUNTER — Ambulatory Visit (HOSPITAL_COMMUNITY)
Admission: RE | Admit: 2021-05-22 | Discharge: 2021-05-22 | Disposition: A | Discharge status: Home / Self Care | Payer: PPO | Source: Ambulatory Visit | Attending: Neurology | Admitting: Neurology

## 2021-05-22 ENCOUNTER — Other Ambulatory Visit: Payer: Self-pay

## 2021-05-22 ENCOUNTER — Telehealth: Payer: Self-pay

## 2021-05-22 DIAGNOSIS — R569 Unspecified convulsions: Secondary | ICD-10-CM | POA: Diagnosis not present

## 2021-05-22 DIAGNOSIS — G40109 Localization-related (focal) (partial) symptomatic epilepsy and epileptic syndromes with simple partial seizures, not intractable, without status epilepticus: Secondary | ICD-10-CM | POA: Insufficient documentation

## 2021-05-22 NOTE — Telephone Encounter (Signed)
Pt missed PT appt on 10/4 due to hypertension

## 2021-05-22 NOTE — Progress Notes (Signed)
EEG complete - results pending 

## 2021-05-26 ENCOUNTER — Encounter: Payer: Self-pay | Admitting: Internal Medicine

## 2021-05-29 ENCOUNTER — Telehealth: Payer: Self-pay | Admitting: Neurology

## 2021-05-29 NOTE — Telephone Encounter (Signed)
Pt's husband, Sonnie Bias (on [place]) called, checking on status of EEG results.  Also to discuss her getting letter so she can get medical Marijuana. Would like a call from the nurse.

## 2021-05-29 NOTE — Telephone Encounter (Signed)
I returned the call to her husband, [name]. He verbalized understanding that medical marijuana is not legal in our state.  He would like the patient's EEG results.

## 2021-06-02 ENCOUNTER — Other Ambulatory Visit: Payer: Self-pay | Admitting: *Deleted

## 2021-06-02 DIAGNOSIS — R569 Unspecified convulsions: Secondary | ICD-10-CM

## 2021-06-02 MED ORDER — OXCARBAZEPINE 300 MG PO TABS: ORAL_TABLET | ORAL | 1 refills | Status: DC

## 2021-06-02 NOTE — Telephone Encounter (Signed)
We sent the EEG results to patient through mychart. She responded that she received them.

## 2021-06-03 ENCOUNTER — Other Ambulatory Visit: Payer: Self-pay | Admitting: Internal Medicine

## 2021-06-10 ENCOUNTER — Other Ambulatory Visit: Payer: Self-pay

## 2021-06-11 ENCOUNTER — Telehealth: Payer: Self-pay | Admitting: Internal Medicine

## 2021-06-11 ENCOUNTER — Ambulatory Visit (INDEPENDENT_AMBULATORY_CARE_PROVIDER_SITE_OTHER): Payer: PPO | Admitting: Internal Medicine

## 2021-06-11 ENCOUNTER — Encounter: Payer: Self-pay | Admitting: Internal Medicine

## 2021-06-11 VITALS — BP 166/100 | HR 77 | Temp 98.5°F | Ht 62.0 in | Wt 173.0 lb

## 2021-06-11 DIAGNOSIS — Z23 Encounter for immunization: Secondary | ICD-10-CM

## 2021-06-11 DIAGNOSIS — I10 Essential (primary) hypertension: Secondary | ICD-10-CM

## 2021-06-11 DIAGNOSIS — G8220 Paraplegia, unspecified: Secondary | ICD-10-CM | POA: Diagnosis not present

## 2021-06-11 DIAGNOSIS — N3000 Acute cystitis without hematuria: Secondary | ICD-10-CM | POA: Diagnosis not present

## 2021-06-11 DIAGNOSIS — R300 Dysuria: Secondary | ICD-10-CM

## 2021-06-11 LAB — POC URINALSYSI DIPSTICK (AUTOMATED)
Bilirubin, UA: NEGATIVE
Blood, UA: POSITIVE
Glucose, UA: NEGATIVE
Ketones, UA: NEGATIVE
Nitrite, UA: NEGATIVE
Protein, UA: POSITIVE — AB
Spec Grav, UA: 1.01 (ref 1.010–1.025)
Urobilinogen, UA: NEGATIVE E.U./dL — AB
pH, UA: 7 (ref 5.0–8.0)

## 2021-06-11 MED ORDER — METOPROLOL TARTRATE 75 MG PO TABS: 75.0000 mg | ORAL_TABLET | Freq: Two times a day (BID) | ORAL | 1 refills | Status: DC

## 2021-06-11 MED ORDER — SULFAMETHOXAZOLE-TRIMETHOPRIM 800-160 MG PO TABS: 1.0000 | ORAL_TABLET | Freq: Two times a day (BID) | ORAL | 0 refills | Status: AC

## 2021-06-11 MED ORDER — SULFAMETHOXAZOLE-TRIMETHOPRIM 800-160 MG PO TABS: 1.0000 | ORAL_TABLET | Freq: Two times a day (BID) | ORAL | 0 refills | Status: DC

## 2021-06-11 MED ORDER — DIAZEPAM 5 MG PO TABS: 5.0000 mg | ORAL_TABLET | Freq: Every day | ORAL | 0 refills | Status: DC

## 2021-06-11 MED ORDER — OXYCODONE-ACETAMINOPHEN 10-325 MG PO TABS
1.0000 | ORAL_TABLET | Freq: Three times a day (TID) | ORAL | 0 refills | Status: DC | PRN
Start: 2021-06-11 — End: 2021-09-09

## 2021-06-11 NOTE — Patient Instructions (Addendum)
-  Nice seeing you today!!  -Start bactrim 1 tablet twice daily for 7 days.  -Increase metoprolol to 75 mg twice daily. Let's schedule a 6 week follow up for your blood pressure.  -Flu vaccine today.

## 2021-06-11 NOTE — Telephone Encounter (Signed)
[facility] is calling and needs additional orders for physical therapy for this patient 2x2

## 2021-06-11 NOTE — Progress Notes (Signed)
Established Patient Office Visit     This visit occurred during the SARS-CoV-2 public health emergency.  Safety protocols were in place, including screening questions prior to the visit, additional usage of staff PPE, and extensive cleaning of exam room while observing appropriate contact time as indicated for disinfecting solutions.    CC/Reason for Visit: Discuss some acute concerns  HPI: Stacy Diaz is a 70 y.o. female who is coming in today for the above mentioned reasons.   #1 she is here to talk about her blood pressure.  I will insert her blood pressure log below.  She is currently on lisinopril 40 mg daily and metoprolol 50 mg twice daily.     2.  She brings in a urine sample.  She thinks she might have a UTI.  Her urine is cloudy, has a strong smell and she is starting to have some left-sided flank pain.  3.  She is due for Valium and oxycodone refills per contract.  4.  She is requesting her flu vaccine.  Past Medical/Surgical History: Past Medical History:  Diagnosis Date   Anxiety    Arthritis    "probably" (08/25/2012)   Breast cancer of lower-outer quadrant of left female breast ([place]) 05/25/2014   Headache(784.0)    "not real frequent" (08/25/2012)   Heart disease    HTN (hypertension)    Hyperlipidemia    Meningioma ([place]) 1999   Myocardial infarction ([place]) 1987   Paresis of lower extremity ([place]) 1999   BLE/notes 08/25/2012   S/P radiation therapy 07/30/14-08/30/13   left breast cancer/50Gy   Seizures ([place])    no seizures since 2014   Spastic paraparesis    S/P meningioma resection 1999/notes 08/25/2012   Stroke ([place])    x 2, the last 2014    Past Surgical History:  Procedure Laterality Date   BRAIN MENINGIOMA EXCISION  05/1997   BREAST LUMPECTOMY WITH NEEDLE LOCALIZATION AND AXILLARY SENTINEL LYMPH NODE BX Left 06/22/2014   Procedure: LEFT [place];  Surgeon: Autumn Messing III, MD;  Location: [facility];   Service: General;  Laterality: Left;   [place]   denied intervention: "blockage wasn't bad enough"   TIBIA IM NAIL INSERTION Right 04/26/2018   Procedure: INTRAMEDULLARY (IM) NAIL TIBIAL;  Surgeon: Altamese [place], MD;  Location: [facility];  Service: Orthopedics;  Laterality: Right;    Social History:  reports that she has quit smoking. Her smoking use included cigarettes. She has a 20.00 pack-year smoking history. She has never used smokeless tobacco. She reports that she does not drink alcohol and does not use drugs.  Allergies: No Known Allergies  Family History:  Family History  Problem Relation Age of Onset   Breast cancer Mother 63       unilateral   Heart disease Father    Breast cancer Paternal Grandmother 22       unilateral     Current Outpatient Medications:    sulfamethoxazole-trimethoprim (BACTRIM DS) 800-160 MG tablet, Take 1 tablet by mouth 2 (two) times daily for 7 days., Disp: 14 tablet, Rfl: 0   ASPERCREME LIDOCAINE EX, Apply 1 application topically daily as needed (muscle pain)., Disp: , Rfl:    atorvastatin (LIPITOR) 10 MG tablet, TAKE 1 TABLET (10 MG TOTAL) BY MOUTH DAILY AT 6 PM. (Patient taking differently: Take 10 mg by mouth every morning.), Disp: 90 tablet, Rfl: 1   baclofen (  LIORESAL) 20 MG tablet, TAKE 1 TABLET BY MOUTH FOUR TIMES A DAY, Disp: 360 tablet, Rfl: 1   Calcium Carb-Cholecalciferol (CALCIUM CARBONATE-VITAMIN D3 PO), Take 1 tablet by mouth daily after lunch. 600mg / 2500 IU, patient takes 2 tablets daily, Disp: , Rfl:    carboxymethylcellulose (REFRESH PLUS) 0.5 % SOLN, Place 1 drop into both eyes every morning., Disp: , Rfl:    cetirizine (ZYRTEC) 10 MG tablet, Take 10 mg by mouth every morning., Disp: , Rfl:    clopidogrel (PLAVIX) 75 MG tablet, TAKE 1 TABLET BY MOUTH EVERY DAY (Patient taking differently: Take 75 mg by mouth every morning.), Disp: 90 tablet, Rfl: 1   diazepam (VALIUM) 5 MG tablet,  Take 1 tablet (5 mg total) by mouth daily., Disp: 90 tablet, Rfl: 0   lisinopril (ZESTRIL) 40 MG tablet, Take 1 tablet (40 mg total) by mouth daily., Disp: 90 tablet, Rfl: 1   liver oil-zinc oxide (DESITIN) 40 % ointment, Apply 1 application topically at bedtime. Apply to buttocks, Disp: , Rfl:    melatonin 5 MG TABS, Take 5 mg by mouth at bedtime., Disp: , Rfl:    metoprolol tartrate 75 MG TABS, Take 75 mg by mouth 2 (two) times daily., Disp: 180 tablet, Rfl: 1   Multiple Vitamin (MULTIVITAMIN WITH MINERALS) TABS tablet, Take 1 tablet by mouth every morning. , Disp: , Rfl:    OVER THE COUNTER MEDICATION, Apply 1 application topically 2 (two) times daily. Over the counter anti-itch cream, Disp: , Rfl:    Oxcarbazepine (TRILEPTAL) 300 MG tablet, Take 300 mg in am and 600 mg at night, Disp: 270 tablet, Rfl: 1   oxyCODONE-acetaminophen (PERCOCET) 10-325 MG tablet, Take 1 tablet by mouth every 8 (eight) hours as needed for pain., Disp: 270 tablet, Rfl: 0   polyethylene glycol (MIRALAX / GLYCOLAX) packet, Take 17 g by mouth daily as needed (for constipation). Mix with liquid and drink, Disp: , Rfl:   Review of Systems:  Constitutional: Denies fever, chills, diaphoresis, appetite change and fatigue.  HEENT: Denies photophobia, eye pain, redness, hearing loss, ear pain, congestion, sore throat, rhinorrhea, sneezing, mouth sores, trouble swallowing, neck pain, neck stiffness and tinnitus.   Respiratory: Denies SOB, DOE, cough, chest tightness,  and wheezing.   Cardiovascular: Denies chest pain, palpitations and leg swelling.  Gastrointestinal: Denies nausea, vomiting, abdominal pain, diarrhea, constipation, blood in stool and abdominal distention.  Genitourinary: Denies , urgency, frequency flank pain. Endocrine: Denies: hot or cold intolerance, sweats, changes in hair or nails, polyuria, polydipsia. Musculoskeletal: Denies myalgias, back pain, joint swelling, arthralgias and gait problem.  Skin: Denies  pallor, rash and wound.  Neurological: Denies dizziness, seizures, syncope, weakness, light-headedness, numbness and headaches.  Hematological: Denies adenopathy. Easy bruising, personal or family bleeding history  Psychiatric/Behavioral: Denies suicidal ideation, mood changes, confusion, nervousness, sleep disturbance and agitation    Physical Exam: Vitals:   06/11/21 1554  BP: (!) 166/100  Pulse: 77  Temp: 98.5 F (36.9 C)  TempSrc: Oral  SpO2: 97%  Weight: 173 lb (78.5 kg)  Height: 5\' 2"  (1.575 m)    Body mass index is 31.64 kg/m.   Constitutional: NAD, calm, comfortable, in wheelchair Eyes: PERRL, lids and conjunctivae normal, wears corrective lenses ENMT: Mucous membranes are moist.  Respiratory: clear to auscultation bilaterally, no wheezing, no crackles. Normal respiratory effort. No accessory muscle use.  Cardiovascular: Regular rate and rhythm, no murmurs / rubs / gallops.  Psychiatric: Normal judgment and insight. Alert and oriented x 3. Normal  mood.    Impression and Plan:  Acute cystitis without hematuria  - Plan: Urinalysis with Reflex Microscopic, Urine Culture, sulfamethoxazole-trimethoprim (BACTRIM DS) 800-160 MG tablet -In office dipstick with 3+ leukocytes, send prescription for Bactrim, sent for urine culture.  Essential hypertension  - Plan: metoprolol tartrate 75 MG TABS -Blood pressure remains above goal. -Continue lisinopril 40 mg daily and increase metoprolol from 50 to 75 mg twice daily. -Advised follow-up in 6 weeks.  Need for influenza vaccination -Flu vaccine administered today.  Paraplegia ([place])  - Plan: diazepam (VALIUM) 5 MG tablet, oxyCODONE-acetaminophen (PERCOCET) 10-325 MG tablet -PDMP reviewed, no red flags, overdose risk score is 400.  Refill Valium to take 1 tablet daily and oxycodone to take 1 tablet every 8 hours scheduled.  Total 53-month prescriptions have been provided.  Time spent: 33 minutes reviewing chart, interviewing  and examining patient and formulating plan of care.   Patient Instructions  -Nice seeing you today!!  -Start bactrim 1 tablet twice daily for 7 days.  -Increase metoprolol to 75 mg twice daily. Let's schedule a 6 week follow up for your blood pressure.  -Flu vaccine today.    Lelon Frohlich, MD [facility] at [facility]

## 2021-06-11 NOTE — Telephone Encounter (Signed)
Flor with [facility] has been given verbal orders

## 2021-06-12 LAB — URINALYSIS, ROUTINE W REFLEX MICROSCOPIC
Bilirubin Urine: NEGATIVE
Ketones, ur: NEGATIVE
Nitrite: NEGATIVE
Specific Gravity, Urine: 1.01 (ref 1.000–1.030)
Total Protein, Urine: NEGATIVE
Urine Glucose: NEGATIVE
Urobilinogen, UA: 0.2 (ref 0.0–1.0)
pH: 7 (ref 5.0–8.0)

## 2021-06-13 LAB — URINE CULTURE
MICRO NUMBER:: 12554493
SPECIMEN QUALITY:: ADEQUATE

## 2021-06-15 NOTE — H&P (View-Only) (Signed)
Cardiology Office Note:    Date:  06/15/2021   ID:  Stacy Diaz, DOB 01-Jun-1951, MRN [id number]  PCP:  Stacy Diaz, Stacy Halsted, MD   [facility] Providers Cardiologist:  None     Referring MD: Stacy Diaz, Estel*   No chief complaint on file.   History of Present Illness:    Stacy Diaz is a 70 y.o. female with a hx of meningioma s/p resection (cannot do MRI), htn, TIA 9/9-9/102022, breast cancer therapy radiation left breast 50 gy (2015), s/p lumpectomy, no chemo  referral for  reduced LV function  She had a TIA v siezure admitted 04/25/2021-04/26/2021 and had an echocardiogram at that time showing reduced LVEF.  She has weakness residual from meningioma. CTA did not show carotid stenosis. She has episodes of left sided arm numbness, paralysis, facial droop; follow by confusion afterwards. Followed by a neurologist and there is concern for seizures. She denies angina, dyspnea on exertion, PND or orthopnea. She has had 15 years of leg swelling. She's in a wheel chair.  Bps at home 140s-180s. Metoprolol was just increased to 75 mg XL and lisnopril 40mg  daily.  She notes seeing a cardiologist 20 years ago. She saw [place] then. They recommended a stress test. Noted heart attack in 1987. Noted that the interventionalist did not stent. She was placed on aspirin. Her blood pressure was high 20 years. Smoked 17 years stopped in 1987.   LDL 67-at goal  Crt- 0.5  Cardiology Studies 04/26/2021,-EF 40-45%, image quality not great, anterior , anteroseptal/infero septal wall appears more hypokinetic normal RV, no significant valve dx, no pulmonary htn 06/04/2016- normal LV fxn, no valve dx  EKG: NSR, PVC,   Past Medical History:  Diagnosis Date   Anxiety    Arthritis    "probably" (08/25/2012)   Breast cancer of lower-outer quadrant of left female breast ([place]) 05/25/2014   Headache(784.0)    "not real frequent" (08/25/2012)   Heart disease    HTN (hypertension)     Hyperlipidemia    Meningioma ([place]) 1999   Myocardial infarction ([place]) 1987   Paresis of lower extremity ([place]) 1999   BLE/notes 08/25/2012   S/P radiation therapy 07/30/14-08/30/13   left breast cancer/50Gy   Seizures ([place])    no seizures since 2014   Spastic paraparesis    S/P meningioma resection 1999/notes 08/25/2012   Stroke ([place])    x 2, the last 2014    Past Surgical History:  Procedure Laterality Date   BRAIN MENINGIOMA EXCISION  05/1997   BREAST LUMPECTOMY WITH NEEDLE LOCALIZATION AND AXILLARY SENTINEL LYMPH NODE BX Left 06/22/2014   Procedure: LEFT [place];  Surgeon: Autumn Messing III, MD;  Location: [place];  Service: General;  Laterality: Left;   [place]   denied intervention: "blockage wasn't bad enough"   TIBIA IM NAIL INSERTION Right 04/26/2018   Procedure: INTRAMEDULLARY (IM) NAIL TIBIAL;  Surgeon: Altamese Eden, MD;  Location: [place];  Service: Orthopedics;  Laterality: Right;    Current Medications: No outpatient medications have been marked as taking for the 06/16/21 encounter (Appointment) with Stacy Mayo, MD.     Allergies:   Patient has no known allergies.   Social History   Socioeconomic History   Marital status: Married    Spouse name: Stacy Diaz   Number of children: 2   Years of education: college   Highest education level: Not on  file  Occupational History   Occupation: disabled  Tobacco Use   Smoking status: Former    Packs/day: 2.00    Years: 10.00    Pack years: 20.00    Types: Cigarettes   Smokeless tobacco: Never   Tobacco comments:    08/25/2012 "quit smoking cigarettes in 1985 when I had my heart attack"  Vaping Use   Vaping Use: Never used  Substance and Sexual Activity   Alcohol use: No    Alcohol/week: 7.0 standard drinks    Types: 7 Glasses of wine per week    Comment: occasionally   Drug use: No   Sexual activity: Never  Other Topics Concern    Not on file  Social History Narrative   Not on file   Social Determinants of Health   Financial Resource Strain: Not on file  Food Insecurity: Not on file  Transportation Needs: Not on file  Physical Activity: Not on file  Stress: Not on file  Social Connections: Not on file     Family History: The patient's family history includes Breast cancer (age of onset: 86) in her mother; Breast cancer (age of onset: 46) in her paternal grandmother; Heart disease in her father.  ROS:   Please see the history of present illness.     All other systems reviewed and are negative.  EKGs/Labs/Other Studies Reviewed:    The following studies were reviewed today:   EKG:  EKG is  ordered today.  The ekg ordered today demonstrates  NSR , septal q waves, LVH with repolarization  Recent Labs: 01/01/2021: Magnesium 2.0 04/26/2021: ALT 20; Hemoglobin 15.0; Platelets 234; TSH 0.558 05/06/2021: BUN CANCELED; Creatinine, Ser CANCELED; Potassium CANCELED; Sodium CANCELED  Recent Lipid Panel    Component Value Date/Time   CHOL 145 04/26/2021 0229   TRIG 62 04/26/2021 0229   HDL 66 04/26/2021 0229   CHOLHDL 2.2 04/26/2021 0229   VLDL 12 04/26/2021 0229   LDLCALC 67 04/26/2021 0229   LDLCALC 94 03/08/2020 1524   LDLDIRECT 142.6 03/26/2011 0832     Risk Assessment/Calculations:           Physical Exam:    VS:   Vitals:   06/16/21 1553  BP: (!) 158/76  Pulse: (!) 59  SpO2: 100%     Wt Readings from Last 3 Encounters:  06/11/21 173 lb (78.5 kg)  05/14/21 173 lb (78.5 kg)  04/26/21 194 lb 0.1 oz (88 kg)     GEN:  Well nourished, well developed in no acute distress HEENT: Normal NECK: No sig JVD at 90 degrees CARDIAC: RRR, no murmurs, rubs, gallops RESPIRATORY:  Clear to auscultation without rales, wheezing or rhonchi  ABDOMEN: Soft, non-tender, non-distended MUSCULOSKELETAL:  LE swelling BL; No deformity  SKIN: Warm and dry NEUROLOGIC:  Alert and oriented x 3 PSYCHIATRIC:   Normal affect   ASSESSMENT:     #Reduced LV function: She has no symptoms. Her anterior wall seems to be more hypokinetic. She has CVD risk factors including chest radiation(see below) , smoking and age EKG V1/V2 q waves. Will plan for nuclear SPECT  #HTN: not controlled. It is noted to be labile 140s-180s per her son. Her metop was just increased to 75 mg BID, will see the affects before any changes. She can continue lisinopril 40 mg daily  #Cardio-Onc: exposed with 50 gy for left breast cancer. Cardiotoxicity from radiation can present up to 10-20 years later. Risk is increased with Gy >=15 (advances over the last  30 years have reduced the mean heart dose). This risk includes atherosclerosis and valvular fibrosis. Also limitations with surgery of the mediastinum was exposed.  Alroy Dust et al. Jacc Cardio Onc 2021, [place] 2020. On statin therapy.  PLAN:    In order of problems listed above:  Nuclear Lexiscan SPECT Follow up 3 months   Shared Decision Making/Informed Consent The risks [chest pain, shortness of breath, cardiac arrhythmias, dizziness, blood pressure fluctuations, myocardial infarction, stroke/transient ischemic attack, nausea, vomiting, allergic reaction, radiation exposure, metallic taste sensation and life-threatening complications (estimated to be 1 in 10,000)], benefits (risk stratification, diagnosing coronary artery disease, treatment guidance) and alternatives of a nuclear stress test were discussed in detail with Ms. Willbanks and she agrees to proceed.   Medication Adjustments/Labs and Tests Ordered: Current medicines are reviewed at length with the patient today.  Concerns regarding medicines are outlined above.   Signed, Stacy Mayo, MD  06/15/2021 9:32 PM    [facility]

## 2021-06-15 NOTE — Progress Notes (Signed)
Cardiology Office Note:    Date:  06/15/2021   ID:  Stacy Diaz, DOB November 05, 1950, MRN [id number]  PCP:  Isaac Bliss, Rayford Halsted, MD   [facility] Providers Cardiologist:  None     Referring MD: Isaac Bliss, Estel*   No chief complaint on file.   History of Present Illness:    Stacy Diaz is a 70 y.o. female with a hx of meningioma s/p resection (cannot do MRI), htn, TIA 9/9-9/102022, breast cancer therapy radiation left breast 50 gy (2015), s/p lumpectomy, no chemo  referral for  reduced LV function  She had a TIA v siezure admitted 04/25/2021-04/26/2021 and had an echocardiogram at that time showing reduced LVEF.  She has weakness residual from meningioma. CTA did not show carotid stenosis. She has episodes of left sided arm numbness, paralysis, facial droop; follow by confusion afterwards. Followed by a neurologist and there is concern for seizures. She denies angina, dyspnea on exertion, PND or orthopnea. She has had 15 years of leg swelling. She's in a wheel chair.  Bps at home 140s-180s. Metoprolol was just increased to 75 mg XL and lisnopril 40mg  daily.  She notes seeing a cardiologist 20 years ago. She saw [place] then. They recommended a stress test. Noted heart attack in 1987. Noted that the interventionalist did not stent. She was placed on aspirin. Her blood pressure was high 20 years. Smoked 17 years stopped in 1987.   LDL 67-at goal  Crt- 0.5  Cardiology Studies 04/26/2021,-EF 40-45%, image quality not great, anterior , anteroseptal/infero septal wall appears more hypokinetic normal RV, no significant valve dx, no pulmonary htn 06/04/2016- normal LV fxn, no valve dx  EKG: NSR, PVC,   Past Medical History:  Diagnosis Date   Anxiety    Arthritis    "probably" (08/25/2012)   Breast cancer of lower-outer quadrant of left female breast ([place]) 05/25/2014   Headache(784.0)    "not real frequent" (08/25/2012)   Heart disease    HTN (hypertension)     Hyperlipidemia    Meningioma ([place]) 1999   Myocardial infarction ([place]) 1987   Paresis of lower extremity ([employer]) 1999   BLE/notes 08/25/2012   S/P radiation therapy 07/30/14-08/30/13   left breast cancer/50Gy   Seizures ([place])    no seizures since 2014   Spastic paraparesis    S/P meningioma resection 1999/notes 08/25/2012   Stroke ([place])    x 2, the last 2014    Past Surgical History:  Procedure Laterality Date   BRAIN MENINGIOMA EXCISION  05/1997   BREAST LUMPECTOMY WITH NEEDLE LOCALIZATION AND AXILLARY SENTINEL LYMPH NODE BX Left 06/22/2014   Procedure: LEFT [place];  Surgeon: Autumn Messing III, MD;  Location: [place];  Service: General;  Laterality: Left;   [place]   denied intervention: "blockage wasn't bad enough"   TIBIA IM NAIL INSERTION Right 04/26/2018   Procedure: INTRAMEDULLARY (IM) NAIL TIBIAL;  Surgeon: Altamese Astor, MD;  Location: [place];  Service: Orthopedics;  Laterality: Right;    Current Medications: No outpatient medications have been marked as taking for the 06/16/21 encounter (Appointment) with Janina Mayo, MD.     Allergies:   Patient has no known allergies.   Social History   Socioeconomic History   Marital status: Married    Spouse name: david   Number of children: 2   Years of education: college   Highest education level: Not on  file  Occupational History   Occupation: disabled  Tobacco Use   Smoking status: Former    Packs/day: 2.00    Years: 10.00    Pack years: 20.00    Types: Cigarettes   Smokeless tobacco: Never   Tobacco comments:    08/25/2012 "quit smoking cigarettes in 1985 when I had my heart attack"  Vaping Use   Vaping Use: Never used  Substance and Sexual Activity   Alcohol use: No    Alcohol/week: 7.0 standard drinks    Types: 7 Glasses of wine per week    Comment: occasionally   Drug use: No   Sexual activity: Never  Other Topics Concern    Not on file  Social History Narrative   Not on file   Social Determinants of Health   Financial Resource Strain: Not on file  Food Insecurity: Not on file  Transportation Needs: Not on file  Physical Activity: Not on file  Stress: Not on file  Social Connections: Not on file     Family History: The patient's family history includes Breast cancer (age of onset: 67) in her mother; Breast cancer (age of onset: 27) in her paternal grandmother; Heart disease in her father.  ROS:   Please see the history of present illness.     All other systems reviewed and are negative.  EKGs/Labs/Other Studies Reviewed:    The following studies were reviewed today:   EKG:  EKG is  ordered today.  The ekg ordered today demonstrates  NSR , septal q waves, LVH with repolarization  Recent Labs: 01/01/2021: Magnesium 2.0 04/26/2021: ALT 20; Hemoglobin 15.0; Platelets 234; TSH 0.558 05/06/2021: BUN CANCELED; Creatinine, Ser CANCELED; Potassium CANCELED; Sodium CANCELED  Recent Lipid Panel    Component Value Date/Time   CHOL 145 04/26/2021 0229   TRIG 62 04/26/2021 0229   HDL 66 04/26/2021 0229   CHOLHDL 2.2 04/26/2021 0229   VLDL 12 04/26/2021 0229   LDLCALC 67 04/26/2021 0229   LDLCALC 94 03/08/2020 1524   LDLDIRECT 142.6 03/26/2011 0832     Risk Assessment/Calculations:           Physical Exam:    VS:   Vitals:   06/16/21 1553  BP: (!) 158/76  Pulse: (!) 59  SpO2: 100%     Wt Readings from Last 3 Encounters:  06/11/21 173 lb (78.5 kg)  05/14/21 173 lb (78.5 kg)  04/26/21 194 lb 0.1 oz (88 kg)     GEN:  Well nourished, well developed in no acute distress HEENT: Normal NECK: No sig JVD at 90 degrees CARDIAC: RRR, no murmurs, rubs, gallops RESPIRATORY:  Clear to auscultation without rales, wheezing or rhonchi  ABDOMEN: Soft, non-tender, non-distended MUSCULOSKELETAL:  LE swelling BL; No deformity  SKIN: Warm and dry NEUROLOGIC:  Alert and oriented x 3 PSYCHIATRIC:   Normal affect   ASSESSMENT:     #Reduced LV function: She has no symptoms. Her anterior wall seems to be more hypokinetic. She has CVD risk factors including chest radiation(see below) , smoking and age EKG V1/V2 q waves. Will plan for nuclear SPECT  #HTN: not controlled. It is noted to be labile 140s-180s per her son. Her metop was just increased to 75 mg BID, will see the affects before any changes. She can continue lisinopril 40 mg daily  #Cardio-Onc: exposed with 50 gy for left breast cancer. Cardiotoxicity from radiation can present up to 10-20 years later. Risk is increased with Gy >=15 (advances over the last  30 years have reduced the mean heart dose). This risk includes atherosclerosis and valvular fibrosis. Also limitations with surgery of the mediastinum was exposed.  Alroy Dust et al. Jacc Cardio Onc 2021, [place] 2020. On statin therapy.  PLAN:    In order of problems listed above:  Nuclear Lexiscan SPECT Follow up 3 months   Shared Decision Making/Informed Consent The risks [chest pain, shortness of breath, cardiac arrhythmias, dizziness, blood pressure fluctuations, myocardial infarction, stroke/transient ischemic attack, nausea, vomiting, allergic reaction, radiation exposure, metallic taste sensation and life-threatening complications (estimated to be 1 in 10,000)], benefits (risk stratification, diagnosing coronary artery disease, treatment guidance) and alternatives of a nuclear stress test were discussed in detail with Ms. Riemann and she agrees to proceed.   Medication Adjustments/Labs and Tests Ordered: Current medicines are reviewed at length with the patient today.  Concerns regarding medicines are outlined above.   Signed, Janina Mayo, MD  06/15/2021 9:32 PM    [facility]

## 2021-06-16 ENCOUNTER — Ambulatory Visit: Payer: PPO | Admitting: Internal Medicine

## 2021-06-16 ENCOUNTER — Other Ambulatory Visit: Payer: Self-pay

## 2021-06-16 ENCOUNTER — Encounter: Payer: Self-pay | Admitting: Internal Medicine

## 2021-06-16 VITALS — BP 158/76 | HR 59 | Ht 63.0 in | Wt 175.0 lb

## 2021-06-16 DIAGNOSIS — I10 Essential (primary) hypertension: Secondary | ICD-10-CM

## 2021-06-16 DIAGNOSIS — I429 Cardiomyopathy, unspecified: Secondary | ICD-10-CM | POA: Diagnosis not present

## 2021-06-16 NOTE — Patient Instructions (Signed)
Medication Instructions:  Continue current medications. No changes.  *If you need a refill on your cardiac medications before your next appointment, please call your pharmacy*    Testing/Procedures: Your physician has requested that you have a lexiscan myoview. For further information please visit [email or web address]. Please follow instruction sheet, as given.   Follow-Up: At [facility], you and your health needs are our priority.  As part of our continuing mission to provide you with exceptional heart care, we have created designated Provider Care Teams.  These Care Teams include your primary Cardiologist (physician) and Advanced Practice Providers (APPs -  Physician Assistants and Nurse Practitioners) who all work together to provide you with the care you need, when you need it.  We recommend signing up for the patient portal called "MyChart".  Sign up information is provided on this After Visit Summary.  MyChart is used to connect with patients for Virtual Visits (Telemedicine).  Patients are able to view lab/test results, encounter notes, upcoming appointments, etc.  Non-urgent messages can be sent to your provider as well.   To learn more about what you can do with MyChart, go to [email or web address].    Your next appointment:   3 month(s)  The format for your next appointment:   In Person  Provider:   Dr. Harl Bowie

## 2021-06-25 NOTE — Addendum Note (Signed)
Addended by: Patria Mane A on: 06/25/2021 08:31 AM   Modules accepted: Orders

## 2021-06-25 NOTE — Addendum Note (Signed)
Addended by: Patria Mane A on: 06/25/2021 08:46 AM   Modules accepted: Orders

## 2021-06-30 ENCOUNTER — Telehealth: Payer: Self-pay | Admitting: Internal Medicine

## 2021-06-30 NOTE — Telephone Encounter (Signed)
Flor PT with [facility] is calling and need extension order for pt to have physical therapy 1x4

## 2021-07-01 NOTE — Telephone Encounter (Signed)
Verbal orders given to [facility] for PT.

## 2021-07-02 ENCOUNTER — Other Ambulatory Visit: Payer: Self-pay

## 2021-07-02 ENCOUNTER — Ambulatory Visit (HOSPITAL_COMMUNITY)
Admission: RE | Admit: 2021-07-02 | Discharge: 2021-07-02 | Disposition: A | Discharge status: Home / Self Care | Payer: PPO | Source: Ambulatory Visit | Attending: Internal Medicine | Admitting: Internal Medicine

## 2021-07-02 ENCOUNTER — Ambulatory Visit (HOSPITAL_COMMUNITY)
Admission: RE | Admit: 2021-07-02 | Payer: PPO | Source: Ambulatory Visit | Attending: Internal Medicine | Admitting: Internal Medicine

## 2021-07-02 DIAGNOSIS — I429 Cardiomyopathy, unspecified: Secondary | ICD-10-CM | POA: Diagnosis not present

## 2021-07-02 DIAGNOSIS — G249 Dystonia, unspecified: Secondary | ICD-10-CM | POA: Diagnosis not present

## 2021-07-02 DIAGNOSIS — I10 Essential (primary) hypertension: Secondary | ICD-10-CM | POA: Diagnosis not present

## 2021-07-02 LAB — NM MYOCAR MULTI W/SPECT W/WALL MOTION / EF
MPHR: 150 {beats}/min
Peak HR: 126 {beats}/min
Percent HR: 84 %
Rest HR: 95 {beats}/min

## 2021-07-02 MED ORDER — TECHNETIUM TC 99M TETROFOSMIN IV KIT
9.4000 | PACK | Freq: Once | INTRAVENOUS | Status: AC | PRN
  Administered 2021-07-02: 9.4 via INTRAVENOUS

## 2021-07-02 MED ORDER — REGADENOSON 0.4 MG/5ML IV SOLN: 0.4000 mg | Freq: Once | INTRAVENOUS | Status: AC

## 2021-07-02 MED ORDER — REGADENOSON 0.4 MG/5ML IV SOLN
INTRAVENOUS | Status: AC
  Administered 2021-07-02: 0.4 mg via INTRAVENOUS
  Filled 2021-07-02: qty 5

## 2021-07-02 MED ORDER — TECHNETIUM TC 99M TETROFOSMIN IV KIT
28.2000 | PACK | Freq: Once | INTRAVENOUS | Status: AC | PRN
  Administered 2021-07-02: 28.2 via INTRAVENOUS

## 2021-07-03 ENCOUNTER — Encounter: Payer: Self-pay | Admitting: Internal Medicine

## 2021-07-03 ENCOUNTER — Telehealth: Payer: Self-pay

## 2021-07-03 DIAGNOSIS — Z01812 Encounter for preprocedural laboratory examination: Secondary | ICD-10-CM

## 2021-07-03 NOTE — Telephone Encounter (Signed)
Patient is scheduled for a left heart cath on Tuesday November 22nd at 12pm with Dr. Sarita Haver with patient's husband [name] (okay per [facility]) he handles scheduling for patient. Made him aware to have patient at [facility] at 10am. Patient has NKA. Made patient's husband aware patient should be NPO after midnight the night before. Advised she will need someone with her the first 24 hours and ride to and from hospital. Advised him to have patient's labs done tomorrow 11/18 at the [facility] office. Patient's husband is aware of all instructions and verbalized understanding. Advised him I will also send instructions through patient's MyChart account. Patient's husband verbalized understanding.   Janina Mayo, MD  You 1 hour ago (12:26 PM)   MB Hi Eliezer Lofts, please schedule Mrs Bellizzi left heart catheterization. Her husband is her care giver and can schedule. He requested a late morning if possible    Janina Mayo, MD 1 hour ago (12:22 PM)   MB I explained the risks and benefits of the procedure, , left heart catheterization. We discussed risks including bleeding, arrhythmia, SCD. She is full code. She is on plavix with stroke hx. She is not on [place]. Discussed with patient's husband who is her care giver.

## 2021-07-03 NOTE — Telephone Encounter (Addendum)
I explained the risks and benefits of the procedure, , left heart catheterization. We discussed risks including bleeding, arrhythmia, SCD. She is full code. She is on plavix with stroke hx. She is not on [place]. Discussed with patient's husband who is her care giver.

## 2021-07-03 NOTE — Telephone Encounter (Signed)
Please see telephone encounter

## 2021-07-03 NOTE — Telephone Encounter (Deleted)
I explained the risks and benefits of the procedure, left heart catheterization. We discussed risks including bleeding, arrhythmia, SCD. She is full code. She is on plavix with stroke hx. She is not on [place]. Discussed with patient's husband who is her care giver.

## 2021-07-04 ENCOUNTER — Encounter: Payer: Self-pay | Admitting: Internal Medicine

## 2021-07-04 ENCOUNTER — Telehealth: Payer: Self-pay | Admitting: Internal Medicine

## 2021-07-04 DIAGNOSIS — I252 Old myocardial infarction: Secondary | ICD-10-CM | POA: Diagnosis not present

## 2021-07-04 DIAGNOSIS — Z7902 Long term (current) use of antithrombotics/antiplatelets: Secondary | ICD-10-CM | POA: Diagnosis not present

## 2021-07-04 DIAGNOSIS — Z87440 Personal history of urinary (tract) infections: Secondary | ICD-10-CM | POA: Diagnosis not present

## 2021-07-04 DIAGNOSIS — Z853 Personal history of malignant neoplasm of breast: Secondary | ICD-10-CM | POA: Diagnosis not present

## 2021-07-04 DIAGNOSIS — G40209 Localization-related (focal) (partial) symptomatic epilepsy and epileptic syndromes with complex partial seizures, not intractable, without status epilepticus: Secondary | ICD-10-CM | POA: Diagnosis not present

## 2021-07-04 DIAGNOSIS — E871 Hypo-osmolality and hyponatremia: Secondary | ICD-10-CM | POA: Diagnosis not present

## 2021-07-04 DIAGNOSIS — G8320 Monoplegia of upper limb affecting unspecified side: Secondary | ICD-10-CM | POA: Diagnosis not present

## 2021-07-04 DIAGNOSIS — M1990 Unspecified osteoarthritis, unspecified site: Secondary | ICD-10-CM | POA: Diagnosis not present

## 2021-07-04 DIAGNOSIS — T402X5D Adverse effect of other opioids, subsequent encounter: Secondary | ICD-10-CM | POA: Diagnosis not present

## 2021-07-04 DIAGNOSIS — F419 Anxiety disorder, unspecified: Secondary | ICD-10-CM | POA: Diagnosis not present

## 2021-07-04 DIAGNOSIS — I110 Hypertensive heart disease with heart failure: Secondary | ICD-10-CM | POA: Diagnosis not present

## 2021-07-04 DIAGNOSIS — I5020 Unspecified systolic (congestive) heart failure: Secondary | ICD-10-CM | POA: Diagnosis not present

## 2021-07-04 DIAGNOSIS — E785 Hyperlipidemia, unspecified: Secondary | ICD-10-CM | POA: Diagnosis not present

## 2021-07-04 DIAGNOSIS — K5909 Other constipation: Secondary | ICD-10-CM | POA: Diagnosis not present

## 2021-07-04 DIAGNOSIS — Z87891 Personal history of nicotine dependence: Secondary | ICD-10-CM | POA: Diagnosis not present

## 2021-07-04 DIAGNOSIS — Z86011 Personal history of benign neoplasm of the brain: Secondary | ICD-10-CM | POA: Diagnosis not present

## 2021-07-04 DIAGNOSIS — G8220 Paraplegia, unspecified: Secondary | ICD-10-CM | POA: Diagnosis not present

## 2021-07-04 DIAGNOSIS — I2510 Atherosclerotic heart disease of native coronary artery without angina pectoris: Secondary | ICD-10-CM | POA: Diagnosis not present

## 2021-07-04 DIAGNOSIS — Z8673 Personal history of transient ischemic attack (TIA), and cerebral infarction without residual deficits: Secondary | ICD-10-CM | POA: Diagnosis not present

## 2021-07-04 DIAGNOSIS — Z01812 Encounter for preprocedural laboratory examination: Secondary | ICD-10-CM | POA: Diagnosis not present

## 2021-07-04 DIAGNOSIS — M810 Age-related osteoporosis without current pathological fracture: Secondary | ICD-10-CM | POA: Diagnosis not present

## 2021-07-04 DIAGNOSIS — I493 Ventricular premature depolarization: Secondary | ICD-10-CM | POA: Diagnosis not present

## 2021-07-04 DIAGNOSIS — Z7982 Long term (current) use of aspirin: Secondary | ICD-10-CM | POA: Diagnosis not present

## 2021-07-04 NOTE — Telephone Encounter (Signed)
Spoke with patient's husband about his concerns of transferring Stacy Diaz from her w/c to the procedure table. He stated his wife received bruising when she was transferred during another procedure. I spoke with Aaron Edelman at cath lab and he said they will assess the patient, her w/c, and decide if she needs a hoyer lift at that time, and will take care in transferring her either way. I called husband back and explained that to him. I also suggested that he make his concerns known when they arrive for the procedure. He voiced understanding.

## 2021-07-04 NOTE — Telephone Encounter (Signed)
Patient's husband states the patient has a cath Tuesday needs to make sure the hospital is notified she will need a hoyer lift or something similar to transfer her from the wheelchair to the bed.

## 2021-07-05 LAB — CBC
Hematocrit: 41.8 % (ref 34.0–46.6)
Hemoglobin: 14.4 g/dL (ref 11.1–15.9)
MCH: 30.7 pg (ref 26.6–33.0)
MCHC: 34.4 g/dL (ref 31.5–35.7)
MCV: 89 fL (ref 79–97)
Platelets: 260 10*3/uL (ref 150–450)
RBC: 4.69 x10E6/uL (ref 3.77–5.28)
RDW: 12.4 % (ref 11.7–15.4)
WBC: 15.4 10*3/uL — ABNORMAL HIGH (ref 3.4–10.8)

## 2021-07-05 LAB — BASIC METABOLIC PANEL
BUN/Creatinine Ratio: 15 (ref 12–28)
BUN: 7 mg/dL — ABNORMAL LOW (ref 8–27)
CO2: 20 mmol/L (ref 20–29)
Calcium: 9 mg/dL (ref 8.7–10.3)
Chloride: 89 mmol/L — ABNORMAL LOW (ref 96–106)
Creatinine, Ser: 0.47 mg/dL — ABNORMAL LOW (ref 0.57–1.00)
Glucose: 86 mg/dL (ref 70–99)
Potassium: 4.5 mmol/L (ref 3.5–5.2)
Sodium: 128 mmol/L — ABNORMAL LOW (ref 134–144)
eGFR: 102 mL/min/{1.73_m2} (ref >59)

## 2021-07-07 ENCOUNTER — Telehealth: Payer: Self-pay

## 2021-07-07 NOTE — Telephone Encounter (Signed)
Called patients husband and reviewed the pre-cath instructions below.   Cardiac catheterization scheduled at [facility] for:12:00 (noon) [facility] Main Entrance [facility]) at:10:00  No solid food after midnight prior to cath, clear liquids until 5 AM day of procedure.  Usual morning medications can be taken pre-cath with sips of water including aspirin 81 mg.  Patient is instructed to take Plavix dose the morning of the procedure regardless of the schedule.    Confirmed patient has responsible adult to drive home post procedure and be with patient first 24 hours after arriving home.  [facility] does allow one visitor to accompany you and wait in the hospital waiting room while you are there for your procedure. You and your visitor will be asked to wear a mask once you enter the hospital.  Patient reports does not currently have any new symptoms concerning for COVID-19 and no household members with COVID-19 like illness.

## 2021-07-08 ENCOUNTER — Other Ambulatory Visit: Payer: Self-pay

## 2021-07-08 ENCOUNTER — Encounter (HOSPITAL_COMMUNITY)
Admission: RE | Disposition: A | Discharge status: Home / Self Care | Payer: Self-pay | Source: Home / Self Care | Attending: Cardiovascular Disease

## 2021-07-08 ENCOUNTER — Ambulatory Visit (HOSPITAL_COMMUNITY)
Admission: RE | Admit: 2021-07-08 | Discharge: 2021-07-08 | Disposition: A | Discharge status: Home / Self Care | Payer: PPO | Attending: Cardiovascular Disease | Admitting: Cardiovascular Disease

## 2021-07-08 DIAGNOSIS — Z8673 Personal history of transient ischemic attack (TIA), and cerebral infarction without residual deficits: Secondary | ICD-10-CM | POA: Insufficient documentation

## 2021-07-08 DIAGNOSIS — Z87891 Personal history of nicotine dependence: Secondary | ICD-10-CM | POA: Insufficient documentation

## 2021-07-08 DIAGNOSIS — Z853 Personal history of malignant neoplasm of breast: Secondary | ICD-10-CM | POA: Diagnosis not present

## 2021-07-08 DIAGNOSIS — I10 Essential (primary) hypertension: Secondary | ICD-10-CM | POA: Insufficient documentation

## 2021-07-08 DIAGNOSIS — R9439 Abnormal result of other cardiovascular function study: Secondary | ICD-10-CM | POA: Diagnosis not present

## 2021-07-08 DIAGNOSIS — I2510 Atherosclerotic heart disease of native coronary artery without angina pectoris: Secondary | ICD-10-CM | POA: Diagnosis not present

## 2021-07-08 DIAGNOSIS — I252 Old myocardial infarction: Secondary | ICD-10-CM | POA: Insufficient documentation

## 2021-07-08 HISTORY — PX: LEFT HEART CATH AND CORONARY ANGIOGRAPHY: CATH118249

## 2021-07-08 SURGERY — LEFT HEART CATH AND CORONARY ANGIOGRAPHY
Anesthesia: LOCAL

## 2021-07-08 MED ORDER — LIDOCAINE HCL (PF) 1 % IJ SOLN
INTRAMUSCULAR | Status: DC | PRN
  Administered 2021-07-08: 2 mL via INTRADERMAL

## 2021-07-08 MED ORDER — HEPARIN SODIUM (PORCINE) 1000 UNIT/ML IJ SOLN
INTRAMUSCULAR | Status: DC | PRN
  Administered 2021-07-08: 4000 [IU] via INTRAVENOUS

## 2021-07-08 MED ORDER — SODIUM CHLORIDE 0.9 % IV SOLN: 250.0000 mL | INTRAVENOUS | Status: DC | PRN

## 2021-07-08 MED ORDER — FENTANYL CITRATE (PF) 100 MCG/2ML IJ SOLN
INTRAMUSCULAR | Status: DC | PRN
  Administered 2021-07-08 (×2): 25 ug via INTRAVENOUS

## 2021-07-08 MED ORDER — ACETAMINOPHEN 325 MG PO TABS
650.0000 mg | ORAL_TABLET | ORAL | Status: DC | PRN
  Administered 2021-07-08: 650 mg via ORAL
  Filled 2021-07-08: qty 2

## 2021-07-08 MED ORDER — VERAPAMIL HCL 2.5 MG/ML IV SOLN
INTRAVENOUS | Status: DC | PRN
  Administered 2021-07-08: 10 mL via INTRA_ARTERIAL

## 2021-07-08 MED ORDER — SODIUM CHLORIDE 0.9 % WEIGHT BASED INFUSION
3.0000 mL/kg/h | INTRAVENOUS | Status: AC
  Administered 2021-07-08: 3 mL/kg/h via INTRAVENOUS

## 2021-07-08 MED ORDER — LABETALOL HCL 5 MG/ML IV SOLN: 10.0000 mg | INTRAVENOUS | Status: DC | PRN

## 2021-07-08 MED ORDER — VERAPAMIL HCL 2.5 MG/ML IV SOLN
INTRAVENOUS | Status: AC
  Filled 2021-07-08: qty 2

## 2021-07-08 MED ORDER — CLOPIDOGREL BISULFATE 75 MG PO TABS: 75.0000 mg | ORAL_TABLET | ORAL | Status: DC

## 2021-07-08 MED ORDER — LIDOCAINE HCL (PF) 1 % IJ SOLN
INTRAMUSCULAR | Status: AC
  Filled 2021-07-08: qty 30

## 2021-07-08 MED ORDER — HEPARIN SODIUM (PORCINE) 1000 UNIT/ML IJ SOLN
INTRAMUSCULAR | Status: AC
  Filled 2021-07-08: qty 1

## 2021-07-08 MED ORDER — SODIUM CHLORIDE 0.9% FLUSH: 3.0000 mL | Freq: Two times a day (BID) | INTRAVENOUS | Status: DC

## 2021-07-08 MED ORDER — SODIUM CHLORIDE 0.9 % IV SOLN: INTRAVENOUS | Status: DC

## 2021-07-08 MED ORDER — ATORVASTATIN CALCIUM 40 MG PO TABS: 40.0000 mg | ORAL_TABLET | Freq: Every day | ORAL | Status: DC

## 2021-07-08 MED ORDER — CLOPIDOGREL BISULFATE 75 MG PO TABS: 75.0000 mg | ORAL_TABLET | Freq: Every day | ORAL | Status: DC

## 2021-07-08 MED ORDER — IOHEXOL 350 MG/ML SOLN
INTRAVENOUS | Status: DC | PRN
  Administered 2021-07-08: 60 mL

## 2021-07-08 MED ORDER — SODIUM CHLORIDE 0.9% FLUSH: 3.0000 mL | INTRAVENOUS | Status: DC | PRN

## 2021-07-08 MED ORDER — MIDAZOLAM HCL 2 MG/2ML IJ SOLN
INTRAMUSCULAR | Status: AC
  Filled 2021-07-08: qty 2

## 2021-07-08 MED ORDER — ONDANSETRON HCL 4 MG/2ML IJ SOLN: 4.0000 mg | Freq: Four times a day (QID) | INTRAMUSCULAR | Status: DC | PRN

## 2021-07-08 MED ORDER — ASPIRIN 81 MG PO CHEW
81.0000 mg | CHEWABLE_TABLET | ORAL | Status: DC
  Filled 2021-07-08: qty 1

## 2021-07-08 MED ORDER — FENTANYL CITRATE (PF) 100 MCG/2ML IJ SOLN
INTRAMUSCULAR | Status: AC
  Filled 2021-07-08: qty 2

## 2021-07-08 MED ORDER — MIDAZOLAM HCL 2 MG/2ML IJ SOLN
INTRAMUSCULAR | Status: DC | PRN
  Administered 2021-07-08 (×2): 1 mg via INTRAVENOUS

## 2021-07-08 MED ORDER — HEPARIN (PORCINE) IN NACL 1000-0.9 UT/500ML-% IV SOLN
INTRAVENOUS | Status: AC
  Filled 2021-07-08: qty 1000

## 2021-07-08 MED ORDER — ASPIRIN 81 MG PO CHEW
81.0000 mg | CHEWABLE_TABLET | Freq: Every day | ORAL | Status: DC
  Administered 2021-07-08: 81 mg via ORAL

## 2021-07-08 MED ORDER — HEPARIN (PORCINE) IN NACL 1000-0.9 UT/500ML-% IV SOLN
INTRAVENOUS | Status: DC | PRN
  Administered 2021-07-08 (×2): 500 mL

## 2021-07-08 MED ORDER — SODIUM CHLORIDE 0.9 % WEIGHT BASED INFUSION: 1.0000 mL/kg/h | INTRAVENOUS | Status: DC

## 2021-07-08 MED ORDER — HYDRALAZINE HCL 20 MG/ML IJ SOLN: 10.0000 mg | INTRAMUSCULAR | Status: DC | PRN

## 2021-07-08 SURGICAL SUPPLY — 11 items

## 2021-07-08 NOTE — Progress Notes (Signed)
Pt has restricted extremity and states she is always stuck many times even by IVT.  One IV obtained. OK to proceed with 1 IV per Stacy Diaz

## 2021-07-08 NOTE — Interval H&P Note (Signed)
Cath Lab Visit (complete for each Cath Lab visit)  Clinical Evaluation Leading to the Procedure:   ACS: No.  Non-ACS:    Anginal Classification: CCS I  Anti-ischemic medical therapy: No Therapy  Non-Invasive Test Results: High-risk stress test findings: cardiac mortality >3%/year  Prior CABG: No previous CABG      History and Physical Interval Note:  07/08/2021 11:45 AM  Stacy Diaz  has presented today for surgery, with the diagnosis of abnormal nuclear stress test.  The various methods of treatment have been discussed with the patient and family. After consideration of risks, benefits and other options for treatment, the patient has consented to  Procedure(s): LEFT HEART CATH AND CORONARY ANGIOGRAPHY (N/A) as a surgical intervention.  The patient's history has been reviewed, patient examined, no change in status, stable for surgery.  I have reviewed the patient's chart and labs.  Questions were answered to the patient's satisfaction.     Shelva Majestic

## 2021-07-08 NOTE — Discharge Instructions (Signed)
Radial Site Care  This sheet gives you information about how to care for yourself after your procedure. Your health care provider may also give you more specific instructions. If you have problems or questions, contact your health care provider. What can I expect after the procedure? After the procedure, it is common to have: Bruising and tenderness at the catheter insertion area. Follow these instructions at home: Medicines Take over-the-counter and prescription medicines only as told by your health care provider. Insertion site care Follow instructions from your health care provider about how to take care of your insertion site. Make sure you: Wash your hands with soap and water before you remove your bandage (dressing). If soap and water are not available, use hand sanitizer. May remove dressing in 24 hours. Check your insertion site every day for signs of infection. Check for: Redness, swelling, or pain. Fluid or blood. Pus or a bad smell. Warmth. Do no take baths, swim, or use a hot tub for 5 days. You may shower 24-48 hours after the procedure. Remove the dressing and gently wash the site with plain soap and water. Pat the area dry with a clean towel. Do not rub the site. That could cause bleeding. Do not apply powder or lotion to the site. Activity  For 24 hours after the procedure, or as directed by your health care provider: Do not flex or bend the affected arm. Do not push or pull heavy objects with the affected arm. Do not drive yourself home from the hospital or clinic. You may drive 24 hours after the procedure. Do not operate machinery or power tools. KEEP ARM ELEVATED THE REMAINDER OF THE DAY. Do not push, pull or lift anything that is heavier than 10 lb for 5 days. Ask your health care provider when it is okay to: Return to work or school. Resume usual physical activities or sports. Resume sexual activity. General instructions If the catheter site starts to  bleed, raise your arm and put firm pressure on the site. If the bleeding does not stop, get help right away. This is a medical emergency. DRINK PLENTY OF FLUIDS FOR THE NEXT 2-3 DAYS. No alcohol consumption for 24 hours after receiving sedation. If you went home on the same day as your procedure, a responsible adult should be with you for the first 24 hours after you arrive home. Keep all follow-up visits as told by your health care provider. This is important. Contact a health care provider if: You have a fever. You have redness, swelling, or yellow drainage around your insertion site. Get help right away if: You have unusual pain at the radial site. The catheter insertion area swells very fast. The insertion area is bleeding, and the bleeding does not stop when you hold steady pressure on the area. Your arm or hand becomes pale, cool, tingly, or numb. These symptoms may represent a serious problem that is an emergency. Do not wait to see if the symptoms will go away. Get medical help right away. Call your local emergency services (911 in the [place] Do not drive yourself to the hospital. Summary After the procedure, it is common to have bruising and tenderness at the site. Follow instructions from your health care provider about how to take care of your radial site wound. Check the wound every day for signs of infection.  This information is not intended to replace advice given to you by your health care provider. Make sure you discuss any questions you have with   your health care provider. Document Revised: 09/08/2017 Document Reviewed: 09/08/2017 Elsevier Patient Education  2020 Elsevier Inc.  

## 2021-07-09 ENCOUNTER — Encounter (HOSPITAL_COMMUNITY): Payer: Self-pay | Admitting: Cardiovascular Disease

## 2021-07-17 ENCOUNTER — Telehealth: Payer: Self-pay | Admitting: Internal Medicine

## 2021-07-17 NOTE — Telephone Encounter (Signed)
Patient's spouse called and wanted to talk with the PA or nurse in regards to the patient

## 2021-07-17 NOTE — Telephone Encounter (Signed)
Spoke to patient's husband.He stated he is upset wife had cardiac cath last week and no one has called to check on her.Stated she is doing ok.Right wrist at cath site is still sore.She has not had any chest pain.Follow up visit scheduled with Dr.Branch 12/14 at 2:40 pm.

## 2021-07-22 ENCOUNTER — Other Ambulatory Visit: Payer: Self-pay | Admitting: Hematology and Oncology

## 2021-07-22 DIAGNOSIS — M810 Age-related osteoporosis without current pathological fracture: Secondary | ICD-10-CM

## 2021-07-22 DIAGNOSIS — Z1231 Encounter for screening mammogram for malignant neoplasm of breast: Secondary | ICD-10-CM

## 2021-07-25 ENCOUNTER — Other Ambulatory Visit: Payer: Self-pay | Admitting: *Deleted

## 2021-07-25 DIAGNOSIS — I10 Essential (primary) hypertension: Secondary | ICD-10-CM

## 2021-07-25 DIAGNOSIS — Z8673 Personal history of transient ischemic attack (TIA), and cerebral infarction without residual deficits: Secondary | ICD-10-CM

## 2021-07-25 DIAGNOSIS — G8220 Paraplegia, unspecified: Secondary | ICD-10-CM

## 2021-07-25 MED ORDER — CLOPIDOGREL BISULFATE 75 MG PO TABS: 75.0000 mg | ORAL_TABLET | Freq: Every day | ORAL | 1 refills | Status: DC

## 2021-07-25 MED ORDER — LISINOPRIL 40 MG PO TABS: 40.0000 mg | ORAL_TABLET | Freq: Every day | ORAL | 1 refills | Status: DC

## 2021-07-30 ENCOUNTER — Encounter: Payer: Self-pay | Admitting: Internal Medicine

## 2021-07-30 ENCOUNTER — Other Ambulatory Visit: Payer: Self-pay

## 2021-07-30 ENCOUNTER — Ambulatory Visit: Payer: PPO | Admitting: Internal Medicine

## 2021-07-30 VITALS — BP 152/80 | HR 61 | Ht 62.0 in | Wt 175.0 lb

## 2021-07-30 DIAGNOSIS — I2510 Atherosclerotic heart disease of native coronary artery without angina pectoris: Secondary | ICD-10-CM | POA: Diagnosis not present

## 2021-07-30 NOTE — Patient Instructions (Signed)
Medication Instructions:  No Changes In Medications at this time.  *If you need a refill on your cardiac medications before your next appointment, please call your pharmacy*  Follow-Up: At [facility], you and your health needs are our priority.  As part of our continuing mission to provide you with exceptional heart care, we have created designated Provider Care Teams.  These Care Teams include your primary Cardiologist (physician) and Advanced Practice Providers (APPs -  Physician Assistants and Nurse Practitioners) who all work together to provide you with the care you need, when you need it.  Your next appointment:   6 month(s)  The format for your next appointment:   In Person  Provider:   Janina Mayo, MD

## 2021-07-30 NOTE — Progress Notes (Signed)
Cardiology Office Note:    Date:  07/30/2021   ID:  Stacy Diaz, DOB [birth year], MRN [id number]  PCP:  Isaac Bliss, Rayford Halsted, MD   [facility] Providers Cardiologist:  None     Referring MD: Isaac Bliss, [place]*   No chief complaint on file.   History of Present Illness:    Stacy Diaz is a 70 y.o. female with a hx of meningioma s/p resection (cannot do MRI), htn, TIA 9/9-9/102022, breast cancer therapy radiation left breast 50 gy (2015), s/p lumpectomy, no chemo  referral for  reduced LV function  She had a TIA v siezure admitted 04/25/2021-04/26/2021 and had an echocardiogram at that time showing reduced LVEF.  She has weakness residual from meningioma. CTA did not show carotid stenosis. She has episodes of left sided arm numbness, paralysis, facial droop; follow by confusion afterwards. Followed by a neurologist and there is concern for seizures. She denies angina, dyspnea on exertion, PND or orthopnea. She has had 15 years of leg swelling. She's in a wheel chair.  Bps at home 140s-180s. Metoprolol was just increased to 75 mg XL and lisnopril 40mg  daily.  She notes seeing a cardiologist 20 years ago. She saw [place] then. They recommended a stress test. Noted heart attack in 1987. Noted that the interventionalist did not stent. She was placed on aspirin. Her blood pressure was high 20 years. Smoked 17 years stopped in 1987.   LDL 67-at goal  Crt- 0.5   Interim Hx: She had a nuclear stress showing large scar in the apex/anteroseptal wall extending to the LV. Her LHC showed non obstructive dx. She had a significant hematoma which resolved. Her blood pressure is erratic. It was high  in the 180s during her LHC. Her blood pressure remain over 130/80 mmHg. She increased her metoprolol 6 weeks ago. She denies CP or SOB. No orthopnea or PND. No swelling in the legs and she sleeps with them elevated.    Wt Readings from Last 3 Encounters:  07/30/21 175 lb (79.4 kg)   07/08/21 175 lb (79.4 kg)  06/16/21 175 lb (79.4 kg)     Cardiology Studies 04/26/2021,-EF 40-45%, image quality not great, anterior , anteroseptal/infero septal wall appears more hypokinetic normal RV, no significant valve dx, no pulmonary htn 06/04/2016- normal LV fxn, no valve dx  EKG: NSR, PVC,   Past Medical History:  Diagnosis Date   Anxiety    Arthritis    "probably" (08/25/2012)   Breast cancer of lower-outer quadrant of left female breast ([place]) 05/25/2014   Headache(784.0)    "not real frequent" (08/25/2012)   Heart disease    HTN (hypertension)    Hyperlipidemia    Meningioma ([place]) 1999   Myocardial infarction ([place]) 1987   Paresis of lower extremity ([place]) 1999   BLE/notes 08/25/2012   S/P radiation therapy 07/30/14-08/30/13   left breast cancer/50Gy   Seizures ([place])    no seizures since 2014   Spastic paraparesis    S/P meningioma resection 1999/notes 08/25/2012   Stroke ([place])    x 2, the last 2014    Past Surgical History:  Procedure Laterality Date   BRAIN MENINGIOMA EXCISION  05/1997   BREAST LUMPECTOMY WITH NEEDLE LOCALIZATION AND AXILLARY SENTINEL LYMPH NODE BX Left 06/22/2014   Procedure: LEFT [place];  Surgeon: Autumn Messing III, MD;  Location: [place];  Service: General;  Laterality: Left;   [place]  1987   denied intervention: "blockage wasn't bad enough"   LEFT HEART CATH AND CORONARY ANGIOGRAPHY N/A 07/08/2021   Procedure: LEFT HEART CATH AND CORONARY ANGIOGRAPHY;  Surgeon: Troy Sine, MD;  Location: [place] CV LAB;  Service: Cardiovascular;  Laterality: N/A;   TIBIA IM NAIL INSERTION Right 04/26/2018   Procedure: INTRAMEDULLARY (IM) NAIL TIBIAL;  Surgeon: Altamese Lathrop, MD;  Location: [place];  Service: Orthopedics;  Laterality: Right;    Current Medications: No outpatient medications have been marked as taking for the 07/30/21 encounter (Appointment) with Janina Mayo, MD.     Allergies:   Patient has no known allergies.   Social History   Socioeconomic History   Marital status: Married    Spouse name: david   Number of children: 2   Years of education: college   Highest education level: Not on file  Occupational History   Occupation: disabled  Tobacco Use   Smoking status: Former    Packs/day: 2.00    Years: 10.00    Pack years: 20.00    Types: Cigarettes   Smokeless tobacco: Never   Tobacco comments:    08/25/2012 "quit smoking cigarettes in 1985 when I had my heart attack"  Vaping Use   Vaping Use: Never used  Substance and Sexual Activity   Alcohol use: No    Alcohol/week: 7.0 standard drinks    Types: 7 Glasses of wine per week    Comment: occasionally   Drug use: No   Sexual activity: Never  Other Topics Concern   Not on file  Social History Narrative   Not on file   Social Determinants of Health   Financial Resource Strain: Not on file  Food Insecurity: Not on file  Transportation Needs: Not on file  Physical Activity: Not on file  Stress: Not on file  Social Connections: Not on file     Family History: The patient's family history includes Breast cancer (age of onset: 35) in her mother; Breast cancer (age of onset: 11) in her paternal grandmother; Heart disease in her father.  ROS:   Please see the history of present illness.     All other systems reviewed and are negative.  EKGs/Labs/Other Studies Reviewed:    The following studies were reviewed today:   EKG:  EKG is  ordered today.  The ekg ordered today demonstrates   NSR , septal q waves, LVH with repolarization  NSR, septal q waves, LVH  Recent Labs: 01/01/2021: Magnesium 2.0 04/26/2021: ALT 20; TSH 0.558 07/04/2021: BUN 7; Creatinine, Ser 0.47; Hemoglobin 14.4; Platelets 260; Potassium 4.5; Sodium 128  Recent Lipid Panel    Component Value Date/Time   CHOL 145 04/26/2021 0229   TRIG 62 04/26/2021 0229   HDL 66 04/26/2021 0229   CHOLHDL  2.2 04/26/2021 0229   VLDL 12 04/26/2021 0229   LDLCALC 67 04/26/2021 0229   LDLCALC 94 03/08/2020 1524   LDLDIRECT 142.6 03/26/2011 0832     Risk Assessment/Calculations:           Physical Exam:    VS:     Vitals:   07/30/21 1432  BP: (!) 152/80  Pulse: 61  SpO2: 97%       Wt Readings from Last 3 Encounters:  07/08/21 175 lb (79.4 kg)  06/16/21 175 lb (79.4 kg)  06/11/21 173 lb (78.5 kg)     GEN:  Well nourished, well developed in no acute distress HEENT: Normal NECK: No sig JVD at  90 degrees CARDIAC: RRR, no murmurs, rubs, gallops RESPIRATORY:  Clear to auscultation without rales, wheezing or rhonchi  ABDOMEN: Soft, non-tender, non-distended MUSCULOSKELETAL:  LE swelling BL; No deformity  SKIN: Warm and dry NEUROLOGIC:  Alert and oriented x 3 PSYCHIATRIC:  Normal affect   ASSESSMENT:    #Ischemic CM: She has no symptoms. Her echo and nuclear c/w scar. LHC showed non obstructive dx. She has anterior scar. Plan to continue BB, statin. She's on plavix. - cont metop , recommend increase to 100 mg BID she noted her PCP plans to - continue lisinopril 40 mg daily - continue atorvastatin 10 mg daily, LDL goal < 70; at goal LDL 67 04/26/2021  #CVA - cont plavix 75 mg daily  #HTN: not controlled. It is noted to be labile 140s-180s per her husband. She's on metop 75 mg BID. She can continue lisinopril 40 mg daily. She plans to work with her PCP to increase her dose.  #Cardio-Onc: exposed with 50 gy for left breast cancer. Cardiotoxicity from radiation can present up to 10-20 years later. Risk is increased with Gy >=15 (advances over the last 30 years have reduced the mean heart dose). This risk includes atherosclerosis and valvular fibrosis. Also limitations with surgery of the mediastinum was exposed.  Alroy Dust et al. Jacc Cardio Onc 2021, [place] 2020. On statin therapy.  PLAN:    In order of problems listed above:  Follow up 6  months   Signed, Janina Mayo, MD  07/30/2021 12:49 PM    [place]

## 2021-08-20 ENCOUNTER — Telehealth (INDEPENDENT_AMBULATORY_CARE_PROVIDER_SITE_OTHER): Payer: PPO | Admitting: Internal Medicine

## 2021-08-20 ENCOUNTER — Encounter: Payer: Self-pay | Admitting: Internal Medicine

## 2021-08-20 VITALS — BP 166/80

## 2021-08-20 DIAGNOSIS — I10 Essential (primary) hypertension: Secondary | ICD-10-CM | POA: Diagnosis not present

## 2021-08-20 DIAGNOSIS — R252 Cramp and spasm: Secondary | ICD-10-CM | POA: Diagnosis not present

## 2021-08-20 MED ORDER — METOPROLOL TARTRATE 100 MG PO TABS: 100.0000 mg | ORAL_TABLET | Freq: Two times a day (BID) | ORAL | 1 refills | Status: DC

## 2021-08-20 NOTE — Telephone Encounter (Signed)
Please review below

## 2021-08-20 NOTE — Telephone Encounter (Signed)
Noted  

## 2021-08-20 NOTE — Progress Notes (Signed)
Virtual Visit via Video Note  I connected with Stacy Diaz on 08/20/21 at  2:30 PM EST by a video enabled telemedicine application and verified that I am speaking with the correct person using two identifiers.  Location patient: home Location provider: work office Persons participating in the virtual visit: patient, provider  I discussed the limitations of evaluation and management by telemedicine and the availability of in person appointments. The patient expressed understanding and agreed to proceed.   HPI: She has scheduled this visit for the purpose of blood pressure.  I will insert her ambulatory measurements below.  She is on lisinopril 40 mg.  At last visit her metoprolol was increased from 50 to 75 mg twice daily.  She has been having some bilateral leg cramps that are most noticeable at nighttime.      ROS: Constitutional: Denies fever, chills, diaphoresis, appetite change and fatigue.  HEENT: Denies photophobia, eye pain, redness, hearing loss, ear pain, congestion, sore throat, rhinorrhea, sneezing, mouth sores, trouble swallowing, neck pain, neck stiffness and tinnitus.   Respiratory: Denies SOB, DOE, cough, chest tightness,  and wheezing.   Cardiovascular: Denies chest pain, palpitations and leg swelling.  Gastrointestinal: Denies nausea, vomiting, abdominal pain, diarrhea, constipation, blood in stool and abdominal distention.  Genitourinary: Denies dysuria, urgency, frequency, hematuria, flank pain and difficulty urinating.  Endocrine: Denies: hot or cold intolerance, sweats, changes in hair or nails, polyuria, polydipsia. Musculoskeletal: Denies myalgias, back pain, joint swelling, arthralgias and gait problem.  Skin: Denies pallor, rash and wound.  Neurological: Denies dizziness, seizures, syncope, weakness, light-headedness, numbness and headaches.  Hematological: Denies adenopathy. Easy bruising, personal or family bleeding history  Psychiatric/Behavioral:  Denies suicidal ideation, mood changes, confusion, nervousness, sleep disturbance and agitation   Past Medical History:  Diagnosis Date   Anxiety    Arthritis    "probably" (08/25/2012)   Breast cancer of lower-outer quadrant of left female breast ([place]) 05/25/2014   Headache(784.0)    "not real frequent" (08/25/2012)   Heart disease    HTN (hypertension)    Hyperlipidemia    Meningioma ([place]) 1999   Myocardial infarction ([place]) 1987   Paresis of lower extremity ([place]) 1999   BLE/notes 08/25/2012   S/P radiation therapy 07/30/14-08/30/13   left breast cancer/50Gy   Seizures ([place])    no seizures since 2014   Spastic paraparesis    S/P meningioma resection 1999/notes 08/25/2012   Stroke ([place])    x 2, the last 2014    Past Surgical History:  Procedure Laterality Date   BRAIN MENINGIOMA EXCISION  05/1997   BREAST LUMPECTOMY WITH NEEDLE LOCALIZATION AND AXILLARY SENTINEL LYMPH NODE BX Left 06/22/2014   Procedure: LEFT [place];  Surgeon: Autumn Messing III, MD;  Location: [place];  Service: General;  Laterality: Left;   [place]   denied intervention: "blockage wasn't bad enough"   LEFT HEART CATH AND CORONARY ANGIOGRAPHY N/A 07/08/2021   Procedure: LEFT HEART CATH AND CORONARY ANGIOGRAPHY;  Surgeon: Troy Sine, MD;  Location: [place] CV [facility];  Service: Cardiovascular;  Laterality: N/A;   TIBIA IM NAIL INSERTION Right 04/26/2018   Procedure: INTRAMEDULLARY (IM) NAIL TIBIAL;  Surgeon: Altamese McEwen, MD;  Location: [place] [facility];  Service: Orthopedics;  Laterality: Right;    Family History  Problem Relation Age of Onset   Breast cancer Mother 73       unilateral   Heart disease  Father    Breast cancer Paternal Grandmother 36       unilateral    SOCIAL HX:   reports that she has quit smoking. Her smoking use included cigarettes. She has a 20.00 pack-year smoking history. She has never used smokeless  tobacco. She reports that she does not drink alcohol and does not use drugs.   Current Outpatient Medications:    ASPERCREME LIDOCAINE EX, Apply 1 application topically daily as needed (muscle pain)., Disp: , Rfl:    atorvastatin (LIPITOR) 10 MG tablet, TAKE 1 TABLET (10 MG TOTAL) BY MOUTH DAILY AT 6 PM. (Patient taking differently: Take 10 mg by mouth every morning.), Disp: 90 tablet, Rfl: 1   baclofen (LIORESAL) 20 MG tablet, TAKE 1 TABLET BY MOUTH FOUR TIMES A DAY, Disp: 360 tablet, Rfl: 1   Calcium Carb-Cholecalciferol (CALCIUM CARBONATE-VITAMIN D3 PO), Take 1 tablet by mouth daily after lunch. 600mg / 2500 IU,, Disp: , Rfl:    carboxymethylcellulose (REFRESH PLUS) 0.5 % SOLN, Place 1 drop into both eyes every morning., Disp: , Rfl:    clopidogrel (PLAVIX) 75 MG tablet, Take 1 tablet (75 mg total) by mouth daily., Disp: 90 tablet, Rfl: 1   diazepam (VALIUM) 5 MG tablet, Take 1 tablet (5 mg total) by mouth daily., Disp: 90 tablet, Rfl: 0   lisinopril (ZESTRIL) 40 MG tablet, Take 1 tablet (40 mg total) by mouth daily., Disp: 90 tablet, Rfl: 1   liver oil-zinc oxide (DESITIN) 40 % ointment, Apply 1 application topically at bedtime. Apply to buttocks, Disp: , Rfl:    melatonin 5 MG TABS, Take 5 mg by mouth at bedtime., Disp: , Rfl:    metoprolol tartrate (LOPRESSOR) 100 MG tablet, Take 1 tablet (100 mg total) by mouth 2 (two) times daily., Disp: 180 tablet, Rfl: 1   Multiple Vitamin (MULTIVITAMIN WITH MINERALS) TABS tablet, Take 1 tablet by mouth every morning. , Disp: , Rfl:    Oxcarbazepine (TRILEPTAL) 300 MG tablet, Take 300 mg in am and 600 mg at night, Disp: 270 tablet, Rfl: 1   oxyCODONE-acetaminophen (PERCOCET) 10-325 MG tablet, Take 1 tablet by mouth every 8 (eight) hours as needed for pain. (Patient taking differently: Take 1 tablet by mouth every 8 (eight) hours.), Disp: 270 tablet, Rfl: 0   polyethylene glycol (MIRALAX / GLYCOLAX) packet, Take 17 g by mouth once a week. Mix with liquid  and drink, Disp: , Rfl:   EXAM:   VITALS per patient if applicable: Blood pressure as above  GENERAL: alert, oriented, appears well and in no acute distress  HEENT: atraumatic, conjunttiva clear, no obvious abnormalities on inspection of external nose and ears  NECK: normal movements of the head and neck  LUNGS: on inspection no signs of respiratory distress, breathing rate appears normal, no obvious gross increased work of breathing, gasping or wheezing  CV: no obvious cyanosis  MS: moves all visible extremities without noticeable abnormality  PSYCH/NEURO: pleasant and cooperative, no obvious depression or anxiety, speech and thought processing grossly intact  ASSESSMENT AND PLAN:   Essential hypertension  - Plan: metoprolol tartrate (LOPRESSOR) 100 MG tablet -Metoprolol will be increased from 75 to 100 mg twice daily, she will do ambulatory blood pressure monitoring and contact me in 6 weeks with values  Leg cramps  - Plan: Comprehensive metabolic panel, Phosphorus, Magnesium  Time spent: 30 minutes reviewing chart, interviewing patient, formulating plan of care.    I discussed the assessment and treatment plan with the patient. The patient was  provided an opportunity to ask questions and all were answered. The patient agreed with the plan and demonstrated an understanding of the instructions.   The patient was advised to call back or seek an in-person evaluation if the symptoms worsen or if the condition fails to improve as anticipated.    Lelon Frohlich, MD  [facility] at [facility]

## 2021-08-21 DIAGNOSIS — I10 Essential (primary) hypertension: Secondary | ICD-10-CM | POA: Diagnosis not present

## 2021-08-21 DIAGNOSIS — R569 Unspecified convulsions: Secondary | ICD-10-CM | POA: Diagnosis not present

## 2021-08-21 DIAGNOSIS — G8220 Paraplegia, unspecified: Secondary | ICD-10-CM | POA: Diagnosis not present

## 2021-08-21 DIAGNOSIS — Z515 Encounter for palliative care: Secondary | ICD-10-CM | POA: Diagnosis not present

## 2021-08-21 DIAGNOSIS — Z993 Dependence on wheelchair: Secondary | ICD-10-CM | POA: Diagnosis not present

## 2021-08-22 ENCOUNTER — Telehealth: Payer: Self-pay | Admitting: Internal Medicine

## 2021-08-22 ENCOUNTER — Encounter: Payer: Self-pay | Admitting: Internal Medicine

## 2021-08-22 DIAGNOSIS — Z86011 Personal history of benign neoplasm of the brain: Secondary | ICD-10-CM | POA: Diagnosis not present

## 2021-08-22 DIAGNOSIS — I110 Hypertensive heart disease with heart failure: Secondary | ICD-10-CM | POA: Diagnosis not present

## 2021-08-22 DIAGNOSIS — E785 Hyperlipidemia, unspecified: Secondary | ICD-10-CM | POA: Diagnosis not present

## 2021-08-22 DIAGNOSIS — M1990 Unspecified osteoarthritis, unspecified site: Secondary | ICD-10-CM | POA: Diagnosis not present

## 2021-08-22 DIAGNOSIS — I252 Old myocardial infarction: Secondary | ICD-10-CM | POA: Diagnosis not present

## 2021-08-22 DIAGNOSIS — Z87891 Personal history of nicotine dependence: Secondary | ICD-10-CM | POA: Diagnosis not present

## 2021-08-22 DIAGNOSIS — E871 Hypo-osmolality and hyponatremia: Secondary | ICD-10-CM | POA: Diagnosis not present

## 2021-08-22 DIAGNOSIS — Z7982 Long term (current) use of aspirin: Secondary | ICD-10-CM | POA: Diagnosis not present

## 2021-08-22 DIAGNOSIS — M810 Age-related osteoporosis without current pathological fracture: Secondary | ICD-10-CM | POA: Diagnosis not present

## 2021-08-22 DIAGNOSIS — K5909 Other constipation: Secondary | ICD-10-CM | POA: Diagnosis not present

## 2021-08-22 DIAGNOSIS — Z7902 Long term (current) use of antithrombotics/antiplatelets: Secondary | ICD-10-CM | POA: Diagnosis not present

## 2021-08-22 DIAGNOSIS — I493 Ventricular premature depolarization: Secondary | ICD-10-CM | POA: Diagnosis not present

## 2021-08-22 DIAGNOSIS — Z87440 Personal history of urinary (tract) infections: Secondary | ICD-10-CM | POA: Diagnosis not present

## 2021-08-22 DIAGNOSIS — I5020 Unspecified systolic (congestive) heart failure: Secondary | ICD-10-CM | POA: Diagnosis not present

## 2021-08-22 DIAGNOSIS — T402X5D Adverse effect of other opioids, subsequent encounter: Secondary | ICD-10-CM | POA: Diagnosis not present

## 2021-08-22 DIAGNOSIS — G8220 Paraplegia, unspecified: Secondary | ICD-10-CM | POA: Diagnosis not present

## 2021-08-22 DIAGNOSIS — G8320 Monoplegia of upper limb affecting unspecified side: Secondary | ICD-10-CM | POA: Diagnosis not present

## 2021-08-22 DIAGNOSIS — Z853 Personal history of malignant neoplasm of breast: Secondary | ICD-10-CM | POA: Diagnosis not present

## 2021-08-22 DIAGNOSIS — F419 Anxiety disorder, unspecified: Secondary | ICD-10-CM | POA: Diagnosis not present

## 2021-08-22 DIAGNOSIS — Z8673 Personal history of transient ischemic attack (TIA), and cerebral infarction without residual deficits: Secondary | ICD-10-CM | POA: Diagnosis not present

## 2021-08-22 DIAGNOSIS — G40209 Localization-related (focal) (partial) symptomatic epilepsy and epileptic syndromes with complex partial seizures, not intractable, without status epilepticus: Secondary | ICD-10-CM | POA: Diagnosis not present

## 2021-08-22 DIAGNOSIS — I2510 Atherosclerotic heart disease of native coronary artery without angina pectoris: Secondary | ICD-10-CM | POA: Diagnosis not present

## 2021-08-22 NOTE — Telephone Encounter (Signed)
Dorian Physical Therapist called in to obtain verbal orders for Patient.  Doris is requesting PT 2x wk for 5 wks for strengthening, posture, and balance.  Dorian the PT states that you can call him at any time on Cell: [phone]

## 2021-08-25 NOTE — Telephone Encounter (Signed)
Verbal orders for PT 2x wk for 5 wks for strengthening, posture, and balance given to Stacy Diaz; verb understanding.

## 2021-08-27 ENCOUNTER — Ambulatory Visit: Payer: PPO

## 2021-09-04 DIAGNOSIS — Z853 Personal history of malignant neoplasm of breast: Secondary | ICD-10-CM | POA: Diagnosis not present

## 2021-09-04 DIAGNOSIS — T402X5D Adverse effect of other opioids, subsequent encounter: Secondary | ICD-10-CM | POA: Diagnosis not present

## 2021-09-04 DIAGNOSIS — Z87440 Personal history of urinary (tract) infections: Secondary | ICD-10-CM | POA: Diagnosis not present

## 2021-09-04 DIAGNOSIS — M810 Age-related osteoporosis without current pathological fracture: Secondary | ICD-10-CM | POA: Diagnosis not present

## 2021-09-04 DIAGNOSIS — E785 Hyperlipidemia, unspecified: Secondary | ICD-10-CM | POA: Diagnosis not present

## 2021-09-04 DIAGNOSIS — G8320 Monoplegia of upper limb affecting unspecified side: Secondary | ICD-10-CM | POA: Diagnosis not present

## 2021-09-04 DIAGNOSIS — M1990 Unspecified osteoarthritis, unspecified site: Secondary | ICD-10-CM | POA: Diagnosis not present

## 2021-09-04 DIAGNOSIS — I493 Ventricular premature depolarization: Secondary | ICD-10-CM | POA: Diagnosis not present

## 2021-09-04 DIAGNOSIS — Z86011 Personal history of benign neoplasm of the brain: Secondary | ICD-10-CM | POA: Diagnosis not present

## 2021-09-04 DIAGNOSIS — I252 Old myocardial infarction: Secondary | ICD-10-CM | POA: Diagnosis not present

## 2021-09-04 DIAGNOSIS — F419 Anxiety disorder, unspecified: Secondary | ICD-10-CM | POA: Diagnosis not present

## 2021-09-04 DIAGNOSIS — Z7902 Long term (current) use of antithrombotics/antiplatelets: Secondary | ICD-10-CM | POA: Diagnosis not present

## 2021-09-04 DIAGNOSIS — Z8673 Personal history of transient ischemic attack (TIA), and cerebral infarction without residual deficits: Secondary | ICD-10-CM | POA: Diagnosis not present

## 2021-09-04 DIAGNOSIS — I2510 Atherosclerotic heart disease of native coronary artery without angina pectoris: Secondary | ICD-10-CM | POA: Diagnosis not present

## 2021-09-04 DIAGNOSIS — G8220 Paraplegia, unspecified: Secondary | ICD-10-CM | POA: Diagnosis not present

## 2021-09-04 DIAGNOSIS — I110 Hypertensive heart disease with heart failure: Secondary | ICD-10-CM | POA: Diagnosis not present

## 2021-09-04 DIAGNOSIS — K5909 Other constipation: Secondary | ICD-10-CM | POA: Diagnosis not present

## 2021-09-04 DIAGNOSIS — Z87891 Personal history of nicotine dependence: Secondary | ICD-10-CM | POA: Diagnosis not present

## 2021-09-04 DIAGNOSIS — G40209 Localization-related (focal) (partial) symptomatic epilepsy and epileptic syndromes with complex partial seizures, not intractable, without status epilepticus: Secondary | ICD-10-CM | POA: Diagnosis not present

## 2021-09-04 DIAGNOSIS — Z7982 Long term (current) use of aspirin: Secondary | ICD-10-CM | POA: Diagnosis not present

## 2021-09-04 DIAGNOSIS — I5020 Unspecified systolic (congestive) heart failure: Secondary | ICD-10-CM | POA: Diagnosis not present

## 2021-09-04 DIAGNOSIS — E871 Hypo-osmolality and hyponatremia: Secondary | ICD-10-CM | POA: Diagnosis not present

## 2021-09-05 ENCOUNTER — Telehealth: Payer: Self-pay | Admitting: Internal Medicine

## 2021-09-05 NOTE — Telephone Encounter (Signed)
Stacy Diaz Stacy Diaz  PT with [facility] is calling pt missed PT visit on 09-02-2021 due to  did not sleep well has a cold. Pt is taking mucinex for the past  couple of weeks is not sure if its helping. Stacy Diaz seen pt on 09-04-2021 and her BP is still running high 165/90. Pt is aware of her BP and per Stacy Diaz she is keeping a log of bp. Pt said she is sending her bp log to md weekly. Please advise

## 2021-09-05 NOTE — Telephone Encounter (Signed)
Spoke with [name], pt's husband. Pt is not feeling well & has been taking a cold medicine. Educated to ensure she does not take anything with a decongestant; verb understanding. Husband stays intermittent high bPs without symptoms. Advised to continue increased dosage of metoprolol (able to repeat back dose/freq) & keep bp log for PCP. Instructed to inform PCP if h/a, chest pain, or SOB. Husband verb understanding.

## 2021-09-08 ENCOUNTER — Other Ambulatory Visit: Payer: Self-pay | Admitting: Internal Medicine

## 2021-09-08 DIAGNOSIS — G8220 Paraplegia, unspecified: Secondary | ICD-10-CM

## 2021-09-08 NOTE — Telephone Encounter (Signed)
Last refill per controlled substance: 06/11/21 Last OV: 08/20/21 Next OV: none scheduled

## 2021-09-11 ENCOUNTER — Encounter: Payer: Self-pay | Admitting: Hematology and Oncology

## 2021-09-11 ENCOUNTER — Ambulatory Visit
Admission: RE | Admit: 2021-09-11 | Discharge: 2021-09-11 | Disposition: A | Discharge status: Home / Self Care | Payer: PPO | Source: Ambulatory Visit | Attending: Hematology and Oncology | Admitting: Hematology and Oncology

## 2021-09-11 DIAGNOSIS — Z1231 Encounter for screening mammogram for malignant neoplasm of breast: Secondary | ICD-10-CM

## 2021-09-17 DIAGNOSIS — I2510 Atherosclerotic heart disease of native coronary artery without angina pectoris: Secondary | ICD-10-CM | POA: Diagnosis not present

## 2021-09-17 DIAGNOSIS — G8220 Paraplegia, unspecified: Secondary | ICD-10-CM | POA: Diagnosis not present

## 2021-09-17 DIAGNOSIS — T402X5D Adverse effect of other opioids, subsequent encounter: Secondary | ICD-10-CM | POA: Diagnosis not present

## 2021-09-17 DIAGNOSIS — Z86011 Personal history of benign neoplasm of the brain: Secondary | ICD-10-CM | POA: Diagnosis not present

## 2021-09-17 DIAGNOSIS — Z7902 Long term (current) use of antithrombotics/antiplatelets: Secondary | ICD-10-CM | POA: Diagnosis not present

## 2021-09-17 DIAGNOSIS — Z7982 Long term (current) use of aspirin: Secondary | ICD-10-CM | POA: Diagnosis not present

## 2021-09-17 DIAGNOSIS — K5909 Other constipation: Secondary | ICD-10-CM | POA: Diagnosis not present

## 2021-09-17 DIAGNOSIS — E871 Hypo-osmolality and hyponatremia: Secondary | ICD-10-CM | POA: Diagnosis not present

## 2021-09-17 DIAGNOSIS — Z8673 Personal history of transient ischemic attack (TIA), and cerebral infarction without residual deficits: Secondary | ICD-10-CM | POA: Diagnosis not present

## 2021-09-17 DIAGNOSIS — I493 Ventricular premature depolarization: Secondary | ICD-10-CM | POA: Diagnosis not present

## 2021-09-17 DIAGNOSIS — G8320 Monoplegia of upper limb affecting unspecified side: Secondary | ICD-10-CM | POA: Diagnosis not present

## 2021-09-17 DIAGNOSIS — M1990 Unspecified osteoarthritis, unspecified site: Secondary | ICD-10-CM | POA: Diagnosis not present

## 2021-09-17 DIAGNOSIS — G40209 Localization-related (focal) (partial) symptomatic epilepsy and epileptic syndromes with complex partial seizures, not intractable, without status epilepticus: Secondary | ICD-10-CM | POA: Diagnosis not present

## 2021-09-17 DIAGNOSIS — Z853 Personal history of malignant neoplasm of breast: Secondary | ICD-10-CM | POA: Diagnosis not present

## 2021-09-17 DIAGNOSIS — Z87440 Personal history of urinary (tract) infections: Secondary | ICD-10-CM | POA: Diagnosis not present

## 2021-09-17 DIAGNOSIS — I252 Old myocardial infarction: Secondary | ICD-10-CM | POA: Diagnosis not present

## 2021-09-17 DIAGNOSIS — I5020 Unspecified systolic (congestive) heart failure: Secondary | ICD-10-CM | POA: Diagnosis not present

## 2021-09-17 DIAGNOSIS — M810 Age-related osteoporosis without current pathological fracture: Secondary | ICD-10-CM | POA: Diagnosis not present

## 2021-09-17 DIAGNOSIS — F419 Anxiety disorder, unspecified: Secondary | ICD-10-CM | POA: Diagnosis not present

## 2021-09-17 DIAGNOSIS — I110 Hypertensive heart disease with heart failure: Secondary | ICD-10-CM | POA: Diagnosis not present

## 2021-09-17 DIAGNOSIS — Z87891 Personal history of nicotine dependence: Secondary | ICD-10-CM | POA: Diagnosis not present

## 2021-09-17 DIAGNOSIS — E785 Hyperlipidemia, unspecified: Secondary | ICD-10-CM | POA: Diagnosis not present

## 2021-09-20 ENCOUNTER — Other Ambulatory Visit: Payer: Self-pay | Admitting: Internal Medicine

## 2021-09-20 DIAGNOSIS — G8220 Paraplegia, unspecified: Secondary | ICD-10-CM

## 2021-09-23 ENCOUNTER — Other Ambulatory Visit (INDEPENDENT_AMBULATORY_CARE_PROVIDER_SITE_OTHER): Payer: PPO

## 2021-09-23 DIAGNOSIS — R252 Cramp and spasm: Secondary | ICD-10-CM

## 2021-09-23 LAB — COMPREHENSIVE METABOLIC PANEL
ALT: 25 U/L (ref 0–35)
AST: 20 U/L (ref 0–37)
Albumin: 4.5 g/dL (ref 3.5–5.2)
Alkaline Phosphatase: 45 U/L (ref 39–117)
BUN: 11 mg/dL (ref 6–23)
CO2: 29 mEq/L (ref 19–32)
Calcium: 9.5 mg/dL (ref 8.4–10.5)
Chloride: 92 mEq/L — ABNORMAL LOW (ref 96–112)
Creatinine, Ser: 0.55 mg/dL (ref 0.40–1.20)
GFR: 92.89 mL/min (ref >60.00)
Glucose, Bld: 94 mg/dL (ref 70–99)
Potassium: 4.6 mEq/L (ref 3.5–5.1)
Sodium: 125 mEq/L — ABNORMAL LOW (ref 135–145)
Total Bilirubin: 0.6 mg/dL (ref 0.2–1.2)
Total Protein: 7 g/dL (ref 6.0–8.3)

## 2021-09-23 LAB — PHOSPHORUS: Phosphorus: 4 mg/dL (ref 2.3–4.6)

## 2021-09-23 LAB — MAGNESIUM: Magnesium: 2 mg/dL (ref 1.5–2.5)

## 2021-09-24 ENCOUNTER — Encounter: Payer: Self-pay | Admitting: Internal Medicine

## 2021-09-24 DIAGNOSIS — E871 Hypo-osmolality and hyponatremia: Secondary | ICD-10-CM | POA: Insufficient documentation

## 2021-09-29 ENCOUNTER — Encounter: Payer: Self-pay | Admitting: Internal Medicine

## 2021-09-30 ENCOUNTER — Telehealth: Payer: Self-pay | Admitting: *Deleted

## 2021-09-30 NOTE — Telephone Encounter (Signed)
Received call from pt spouse [name] requesting advice from MD if pt needing to take a "break" from [facility] after 2 years.  Per MD pt does not need any sort of "break" but would need a repeat bone density scan after 2 years.  [name] verbalized understanding and states pt will continue to receive Prolia injection with her PCP and is scheduled for a bone density exam in May.

## 2021-10-01 ENCOUNTER — Ambulatory Visit (INDEPENDENT_AMBULATORY_CARE_PROVIDER_SITE_OTHER): Payer: PPO

## 2021-10-01 DIAGNOSIS — M8000XA Age-related osteoporosis with current pathological fracture, unspecified site, initial encounter for fracture: Secondary | ICD-10-CM | POA: Diagnosis not present

## 2021-10-01 MED ORDER — DENOSUMAB 60 MG/ML ~~LOC~~ SOSY
60.0000 mg | PREFILLED_SYRINGE | Freq: Once | SUBCUTANEOUS | Status: AC
  Administered 2021-10-01: 60 mg via SUBCUTANEOUS

## 2021-10-01 NOTE — Progress Notes (Signed)
Stacy Diaz is a 71 y.o. female presents to the office today for Prolia injections per Dr Jerilee Hoh.  Original order: Prolia every 6 months along with calcium and vitamin D Prolia 60mg  subcutaneously was administered right arm today. Patient tolerated injection. Patient due for follow up labs/provider appt: No.  Patient next injection due: 04/01/22, appt made Yes  Lucinda Dell

## 2021-10-02 DIAGNOSIS — M1990 Unspecified osteoarthritis, unspecified site: Secondary | ICD-10-CM | POA: Diagnosis not present

## 2021-10-02 DIAGNOSIS — Z8673 Personal history of transient ischemic attack (TIA), and cerebral infarction without residual deficits: Secondary | ICD-10-CM | POA: Diagnosis not present

## 2021-10-02 DIAGNOSIS — Z7982 Long term (current) use of aspirin: Secondary | ICD-10-CM | POA: Diagnosis not present

## 2021-10-02 DIAGNOSIS — M810 Age-related osteoporosis without current pathological fracture: Secondary | ICD-10-CM | POA: Diagnosis not present

## 2021-10-02 DIAGNOSIS — Z87891 Personal history of nicotine dependence: Secondary | ICD-10-CM | POA: Diagnosis not present

## 2021-10-02 DIAGNOSIS — E871 Hypo-osmolality and hyponatremia: Secondary | ICD-10-CM | POA: Diagnosis not present

## 2021-10-02 DIAGNOSIS — Z87440 Personal history of urinary (tract) infections: Secondary | ICD-10-CM | POA: Diagnosis not present

## 2021-10-02 DIAGNOSIS — F419 Anxiety disorder, unspecified: Secondary | ICD-10-CM | POA: Diagnosis not present

## 2021-10-02 DIAGNOSIS — Z853 Personal history of malignant neoplasm of breast: Secondary | ICD-10-CM | POA: Diagnosis not present

## 2021-10-02 DIAGNOSIS — Z7902 Long term (current) use of antithrombotics/antiplatelets: Secondary | ICD-10-CM | POA: Diagnosis not present

## 2021-10-02 DIAGNOSIS — G40209 Localization-related (focal) (partial) symptomatic epilepsy and epileptic syndromes with complex partial seizures, not intractable, without status epilepticus: Secondary | ICD-10-CM | POA: Diagnosis not present

## 2021-10-02 DIAGNOSIS — G8220 Paraplegia, unspecified: Secondary | ICD-10-CM | POA: Diagnosis not present

## 2021-10-02 DIAGNOSIS — I493 Ventricular premature depolarization: Secondary | ICD-10-CM | POA: Diagnosis not present

## 2021-10-02 DIAGNOSIS — K5909 Other constipation: Secondary | ICD-10-CM | POA: Diagnosis not present

## 2021-10-02 DIAGNOSIS — E785 Hyperlipidemia, unspecified: Secondary | ICD-10-CM | POA: Diagnosis not present

## 2021-10-02 DIAGNOSIS — T402X5D Adverse effect of other opioids, subsequent encounter: Secondary | ICD-10-CM | POA: Diagnosis not present

## 2021-10-02 DIAGNOSIS — G8320 Monoplegia of upper limb affecting unspecified side: Secondary | ICD-10-CM | POA: Diagnosis not present

## 2021-10-02 DIAGNOSIS — I110 Hypertensive heart disease with heart failure: Secondary | ICD-10-CM | POA: Diagnosis not present

## 2021-10-02 DIAGNOSIS — I5020 Unspecified systolic (congestive) heart failure: Secondary | ICD-10-CM | POA: Diagnosis not present

## 2021-10-02 DIAGNOSIS — I2510 Atherosclerotic heart disease of native coronary artery without angina pectoris: Secondary | ICD-10-CM | POA: Diagnosis not present

## 2021-10-02 DIAGNOSIS — I252 Old myocardial infarction: Secondary | ICD-10-CM | POA: Diagnosis not present

## 2021-10-02 DIAGNOSIS — Z86011 Personal history of benign neoplasm of the brain: Secondary | ICD-10-CM | POA: Diagnosis not present

## 2021-10-10 ENCOUNTER — Encounter: Payer: Self-pay | Admitting: Internal Medicine

## 2021-10-10 MED ORDER — ATORVASTATIN CALCIUM 10 MG PO TABS: 10.0000 mg | ORAL_TABLET | Freq: Every day | ORAL | 1 refills | Status: DC

## 2021-10-13 ENCOUNTER — Ambulatory Visit: Payer: PPO | Admitting: Internal Medicine

## 2021-10-28 DIAGNOSIS — H2513 Age-related nuclear cataract, bilateral: Secondary | ICD-10-CM | POA: Diagnosis not present

## 2021-11-03 ENCOUNTER — Ambulatory Visit: Payer: PPO | Admitting: Neurology

## 2021-11-03 ENCOUNTER — Encounter: Payer: Self-pay | Admitting: Neurology

## 2021-11-03 VITALS — BP 181/85 | HR 65 | Ht 62.0 in

## 2021-11-03 DIAGNOSIS — M62838 Other muscle spasm: Secondary | ICD-10-CM | POA: Diagnosis not present

## 2021-11-03 DIAGNOSIS — R202 Paresthesia of skin: Secondary | ICD-10-CM | POA: Diagnosis not present

## 2021-11-03 MED ORDER — BACLOFEN 20 MG PO TABS
ORAL_TABLET | ORAL | 1 refills | Status: DC
Start: 2021-11-03 — End: 2021-12-04

## 2021-11-03 NOTE — Progress Notes (Signed)
?[facility] ?[address] ?[place]. [place] [address] ?[phone] ? ?     OFFICE FOLLOW UP VISIT NOTE ? ?[name] ?Date of Birth:  Apr 14, 1951 ?Medical Record Number:  [id number]  ? ?Referring MD: Gypsy Balsam ? ?Reason for Referral: TIA ? ?HPI: Initial visit 05/06/2021: Stacy Diaz is a 71 year old Caucasian lady seen for an office consultation visit today.  She is accompanied by her husband [name].  History is obtained from them and review of electronic medical records and personally reviewed available pertinent imaging films in PACS.  She has a past medical history of TIAs, stroke, meningioma s/p resection and October 1998 with residual paraplegia and left arm weakness and wheelchair-bound.  She presented on 04/25/2021 with sudden onset of left upper extremity paresthesias followed by weakness and facial droop.  She states that these episodes are recurrent and have been happening since 2014.  They occur at a frequency of once every 2 3 months.  Previous episodes were quite transient but they are 1 recently was more severe and lasted longer ?Came to the ER.  Following these episodes she appears tired and sleepy.  She reports numbness starting in the hands and gradually over several minutes spreading up the forearm into her arm and eventually her face.  She is awake during this event but does feel tired and lethargic after it is over.  There are no obvious triggers.  She denies any such episodes involving the right side of the body or face.  She denies any headache before during or after these episodes.  She in fact was seen by me for these episodes in 2014 and initially thought to be TIAs but subsequently started on Keppra for focal seizures and then switched to Trileptal.  She is currently on Trileptal 300 mg twice daily but still having these episodes.  She is tolerating it well without any side effects. ?Update 11/03/2021 : She returns for follow-up after last visit 6 months ago.   She is accompanied by her husband.  Patient states she is doing better after increasing the dose of Trileptal to 300 mg in the morning and 600 at night.  He has had only 1 brief episode of left-sided paresthesias lasting less than 5 minutes.  About a month and a half ago.  She is complaining of increasing leg, spasms at night.  On certain nights she has trouble sleeping.  She takes baclofen 20 mg 4 times daily.  He is noted a higher dose.  She is tolerating baclofen well without any daytime sleepiness, tiredness,.  She had lab work done at last visit on 05/06/2021 which showed normal carbamazepine level was normal. ?ROS:   ?14 system review of systems is positive for leg spasms weakness, numbness, paraplegia, difficulty walking all other systems negative ? ?PMH:  ?Past Medical History:  ?Diagnosis Date  ? Anxiety   ? Arthritis   ? "probably" (08/25/2012)  ? Breast cancer of lower-outer quadrant of left female breast ([place]) 05/25/2014  ? Headache(784.0)   ? "not real frequent" (08/25/2012)  ? Heart disease   ? HTN (hypertension)   ? Hyperlipidemia   ? Meningioma ([place]) 1999  ? Myocardial infarction [facility]) 1987  ? 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 BLE/notes 08/25/2012  ? S/P radiation therapy 07/30/14-08/30/13  ? left breast cancer/50Gy  ? Seizures ([place])   ? no seizures since 2014  ? Spastic paraparesis   ? S/P meningioma resection 1999/notes 08/25/2012  ? Stroke [facility])   ?  x 2, the last 2014  ? ? ?Social History:  ?Social History  ? ?Socioeconomic History  ? Marital status: Married  ?  Spouse name: [name]  ? Number of children: 2  ? Years of education: college  ? Highest education level: Bachelor's degree (e.g., BA, AB, BS)  ?Occupational History  ? Occupation: disabled  ?Tobacco Use  ? Smoking status: Former  ?  Packs/day: 2.00  ?  Years: 10.00  ?  Pack years: 20.00  ?  Types: Cigarettes  ? Smokeless tobacco: Never  ? Tobacco comments:  ?  08/25/2012 "quit smoking cigarettes in 1985 when I had my heart attack"  ?Vaping Use  ?  Vaping Use: Never used  ?Substance and Sexual Activity  ? Alcohol use: No  ?  Alcohol/week: 7.0 standard drinks  ?  Types: 7 Glasses of wine per week  ?  Comment: occasionally  ? Drug use: No  ? Sexual activity: Never  ?Other Topics Concern  ? Not on file  ?Social History Narrative  ? Not on file  ? ?Social Determinants of Health  ? ?Financial Resource Strain: Medium Risk  ? Difficulty of Paying Living Expenses: Somewhat hard  ?Food Insecurity: No Food Insecurity  ? Worried About Charity fundraiser in the Last Year: Never true  ? Ran Out of Food in the Last Year: Never true  ?Transportation Needs: No Transportation Needs  ? Lack of Transportation (Medical): No  ? Lack of Transportation (Non-Medical): No  ?Physical Activity: Unknown  ? Days of Exercise per Week: 0 days  ? Minutes of Exercise per Session: Not on file  ?Stress: Stress Concern Present  ? Feeling of Stress : Rather much  ?Social Connections: Unknown  ? Frequency of Communication with Friends and Family: Three times a week  ? Frequency of Social Gatherings with Friends and Family: Once a week  ? Attends Religious Services: Patient refused  ? Active Member of Clubs or Organizations: Yes  ? Attends Archivist Meetings: Patient refused  ? Marital Status: Married  ?Intimate Partner Violence: Not on file  ? ? ?Medications:   ?Current Outpatient Medications on File Prior to Visit  ?Medication Sig Dispense Refill  ? ASPERCREME LIDOCAINE EX Apply 1 application topically daily as needed (muscle pain).    ? atorvastatin (LIPITOR) 10 MG tablet Take 1 tablet (10 mg total) by mouth daily at 6 PM. 90 tablet 1  ? Calcium Carb-Cholecalciferol (CALCIUM CARBONATE-VITAMIN D3 PO) Take 1 tablet by mouth daily after lunch. '600mg'$ / 2500 IU,    ? carboxymethylcellulose (REFRESH PLUS) 0.5 % SOLN Place 1 drop into both eyes every morning.    ? clopidogrel (PLAVIX) 75 MG tablet Take 1 tablet (75 mg total) by mouth daily. 90 tablet 1  ? diazepam (VALIUM) 5 MG tablet TAKE  ONE TABLET BY MOUTH ONE TIME DAILY 90 tablet 0  ? lisinopril (ZESTRIL) 40 MG tablet Take 1 tablet (40 mg total) by mouth daily. 90 tablet 1  ? liver oil-zinc oxide (DESITIN) 40 % ointment Apply 1 application topically at bedtime. Apply to buttocks    ? melatonin 5 MG TABS Take 5 mg by mouth at bedtime.    ? metoprolol tartrate (LOPRESSOR) 100 MG tablet Take 1 tablet (100 mg total) by mouth 2 (two) times daily. 180 tablet 1  ? Multiple Vitamin (MULTIVITAMIN WITH MINERALS) TABS tablet Take 1 tablet by mouth every morning.     ? Oxcarbazepine (TRILEPTAL) 300 MG tablet Take 300 mg in am  and 600 mg at night 270 tablet 1  ? oxyCODONE-acetaminophen (PERCOCET) 10-325 MG tablet take 1 tablet by mouth every 8 hours as needed for pain 270 tablet 0  ? polyethylene glycol (MIRALAX / GLYCOLAX) packet Take 17 g by mouth once a week. Mix with liquid and drink    ? [DISCONTINUED] atenolol (TENORMIN) 50 MG tablet TAKE 1 & 1/2 TABLET BY MOUTH TWICE DAILY **NEED OFFICE VISIT 270 tablet 1  ? ?No current facility-administered medications on file prior to visit.  ? ? ?Allergies:  No Known Allergies ? ?Physical Exam ?General: well developed, well nourished, elderly Caucasian lady seated, in no evident distress.  Old craniotomy scar on the vertex. ?Head: head normocephalic and atraumatic.   ?Neck: supple with no carotid or supraclavicular bruits ?Cardiovascular: regular rate and rhythm, no murmurs ?Musculoskeletal: Mild kyphoscoliosis.  Paraplegia with bilateral AFOs ?Skin:  no rash/petichiae ?Vascular:  Normal pulses all extremities 1+ pedal edema bilaterally ? ?Neurologic Exam ?Mental Status: Awake and fully alert. Oriented to place and time. Recent and remote memory intact. Attention span, concentration and fund of knowledge appropriate. Mood and affect appropriate.  ?Cranial Nerves: Fundoscopic exam not done s. Pupils equal, briskly reactive to light. Extraocular movements full without nystagmus. Visual fields full to confrontation.  Hearing intact. Facial sensation intact. Face, tongue, palate moves normally and symmetrically.  ?Motor: Normal strength tone reflex in the right upper extremity.  Mild weakness of left upper extremity

## 2021-11-03 NOTE — Patient Instructions (Signed)
I had a long discussion with the patient and her husband regarding her episodes of transient left body paresthesias which appear to be doing better after increasing the dose of Trileptal  to 300 mg in the morning and 600 mg at night and continue with current dose.  She is also complaining of increasing nocturnal leg spasms and recommend increasing baclofen dose to 20 mg 4 times daily and she may have an extra dose at night if necessary.  She will return for follow-up in the future in 6 months or call earlier if necessary. ?

## 2021-11-17 ENCOUNTER — Encounter: Payer: Self-pay | Admitting: Internal Medicine

## 2021-12-01 ENCOUNTER — Other Ambulatory Visit: Payer: Self-pay | Admitting: Internal Medicine

## 2021-12-01 DIAGNOSIS — G8220 Paraplegia, unspecified: Secondary | ICD-10-CM

## 2021-12-02 MED ORDER — OXYCODONE-ACETAMINOPHEN 10-325 MG PO TABS: 1.0000 | ORAL_TABLET | Freq: Three times a day (TID) | ORAL | 0 refills | Status: DC | PRN

## 2021-12-04 ENCOUNTER — Other Ambulatory Visit: Payer: Self-pay | Admitting: Internal Medicine

## 2021-12-10 ENCOUNTER — Encounter: Payer: Self-pay | Admitting: Internal Medicine

## 2021-12-10 ENCOUNTER — Other Ambulatory Visit: Payer: Self-pay | Admitting: Internal Medicine

## 2021-12-10 DIAGNOSIS — G8220 Paraplegia, unspecified: Secondary | ICD-10-CM

## 2021-12-10 MED ORDER — OXYCODONE-ACETAMINOPHEN 10-325 MG PO TABS: 1.0000 | ORAL_TABLET | Freq: Three times a day (TID) | ORAL | 0 refills | Status: DC | PRN

## 2021-12-10 NOTE — Telephone Encounter (Signed)
[facility] doesn't have enough Percoset to fill prescription.  [facility] cancelled the prescription according to pt's husband.  Pt has 1 day left. ? ?Pharmacy--[facility] [place]. ?

## 2021-12-16 ENCOUNTER — Encounter: Payer: Self-pay | Admitting: Internal Medicine

## 2021-12-16 ENCOUNTER — Encounter: Payer: Self-pay | Admitting: Hematology and Oncology

## 2021-12-16 ENCOUNTER — Ambulatory Visit
Admission: RE | Admit: 2021-12-16 | Discharge: 2021-12-16 | Disposition: A | Discharge status: Home / Self Care | Payer: PPO | Source: Ambulatory Visit | Attending: Hematology and Oncology | Admitting: Hematology and Oncology

## 2021-12-16 DIAGNOSIS — M810 Age-related osteoporosis without current pathological fracture: Secondary | ICD-10-CM | POA: Diagnosis not present

## 2021-12-16 DIAGNOSIS — Z780 Asymptomatic menopausal state: Secondary | ICD-10-CM | POA: Diagnosis not present

## 2021-12-17 ENCOUNTER — Other Ambulatory Visit: Payer: Self-pay | Admitting: Internal Medicine

## 2021-12-17 DIAGNOSIS — G8220 Paraplegia, unspecified: Secondary | ICD-10-CM

## 2021-12-22 ENCOUNTER — Inpatient Hospital Stay (HOSPITAL_COMMUNITY)
Admission: EM | Admit: 2021-12-22 | Discharge: 2021-12-29 | DRG: 871 | Disposition: A | Discharge status: Skilled Nursing Facility | Payer: PPO | Attending: Internal Medicine | Admitting: Internal Medicine

## 2021-12-22 ENCOUNTER — Telehealth: Payer: PPO | Admitting: Internal Medicine

## 2021-12-22 ENCOUNTER — Other Ambulatory Visit: Payer: Self-pay

## 2021-12-22 ENCOUNTER — Emergency Department (HOSPITAL_COMMUNITY): Payer: PPO

## 2021-12-22 ENCOUNTER — Encounter (HOSPITAL_COMMUNITY): Payer: Self-pay

## 2021-12-22 ENCOUNTER — Inpatient Hospital Stay (HOSPITAL_COMMUNITY): Payer: PPO

## 2021-12-22 DIAGNOSIS — R531 Weakness: Secondary | ICD-10-CM | POA: Diagnosis not present

## 2021-12-22 DIAGNOSIS — A419 Sepsis, unspecified organism: Secondary | ICD-10-CM | POA: Diagnosis present

## 2021-12-22 DIAGNOSIS — I2510 Atherosclerotic heart disease of native coronary artery without angina pectoris: Secondary | ICD-10-CM | POA: Diagnosis present

## 2021-12-22 DIAGNOSIS — K760 Fatty (change of) liver, not elsewhere classified: Secondary | ICD-10-CM | POA: Diagnosis present

## 2021-12-22 DIAGNOSIS — I700 Atherosclerosis of aorta: Secondary | ICD-10-CM | POA: Diagnosis not present

## 2021-12-22 DIAGNOSIS — F419 Anxiety disorder, unspecified: Secondary | ICD-10-CM | POA: Diagnosis present

## 2021-12-22 DIAGNOSIS — Z8603 Personal history of neoplasm of uncertain behavior: Secondary | ICD-10-CM

## 2021-12-22 DIAGNOSIS — E785 Hyperlipidemia, unspecified: Secondary | ICD-10-CM | POA: Diagnosis not present

## 2021-12-22 DIAGNOSIS — Z87891 Personal history of nicotine dependence: Secondary | ICD-10-CM

## 2021-12-22 DIAGNOSIS — E860 Dehydration: Secondary | ICD-10-CM | POA: Diagnosis present

## 2021-12-22 DIAGNOSIS — Z853 Personal history of malignant neoplasm of breast: Secondary | ICD-10-CM

## 2021-12-22 DIAGNOSIS — F061 Catatonic disorder due to known physiological condition: Secondary | ICD-10-CM

## 2021-12-22 DIAGNOSIS — I160 Hypertensive urgency: Secondary | ICD-10-CM | POA: Diagnosis present

## 2021-12-22 DIAGNOSIS — R471 Dysarthria and anarthria: Secondary | ICD-10-CM | POA: Diagnosis not present

## 2021-12-22 DIAGNOSIS — I69320 Aphasia following cerebral infarction: Secondary | ICD-10-CM | POA: Diagnosis not present

## 2021-12-22 DIAGNOSIS — G9341 Metabolic encephalopathy: Secondary | ICD-10-CM | POA: Diagnosis present

## 2021-12-22 DIAGNOSIS — E871 Hypo-osmolality and hyponatremia: Secondary | ICD-10-CM | POA: Diagnosis present

## 2021-12-22 DIAGNOSIS — E875 Hyperkalemia: Secondary | ICD-10-CM | POA: Diagnosis present

## 2021-12-22 DIAGNOSIS — I110 Hypertensive heart disease with heart failure: Secondary | ICD-10-CM | POA: Diagnosis not present

## 2021-12-22 DIAGNOSIS — R4182 Altered mental status, unspecified: Secondary | ICD-10-CM | POA: Diagnosis not present

## 2021-12-22 DIAGNOSIS — G8220 Paraplegia, unspecified: Secondary | ICD-10-CM | POA: Diagnosis present

## 2021-12-22 DIAGNOSIS — Z9889 Other specified postprocedural states: Secondary | ICD-10-CM | POA: Diagnosis not present

## 2021-12-22 DIAGNOSIS — Z8249 Family history of ischemic heart disease and other diseases of the circulatory system: Secondary | ICD-10-CM

## 2021-12-22 DIAGNOSIS — Z7902 Long term (current) use of antithrombotics/antiplatelets: Secondary | ICD-10-CM

## 2021-12-22 DIAGNOSIS — Z993 Dependence on wheelchair: Secondary | ICD-10-CM

## 2021-12-22 DIAGNOSIS — R2681 Unsteadiness on feet: Secondary | ICD-10-CM | POA: Diagnosis not present

## 2021-12-22 DIAGNOSIS — I517 Cardiomegaly: Secondary | ICD-10-CM | POA: Diagnosis not present

## 2021-12-22 DIAGNOSIS — A4151 Sepsis due to Escherichia coli [E. coli]: Principal | ICD-10-CM | POA: Diagnosis present

## 2021-12-22 DIAGNOSIS — I69354 Hemiplegia and hemiparesis following cerebral infarction affecting left non-dominant side: Secondary | ICD-10-CM | POA: Diagnosis not present

## 2021-12-22 DIAGNOSIS — R29810 Facial weakness: Secondary | ICD-10-CM | POA: Diagnosis not present

## 2021-12-22 DIAGNOSIS — I252 Old myocardial infarction: Secondary | ICD-10-CM

## 2021-12-22 DIAGNOSIS — I10 Essential (primary) hypertension: Secondary | ICD-10-CM | POA: Diagnosis present

## 2021-12-22 DIAGNOSIS — R000 Tachycardia, unspecified: Secondary | ICD-10-CM | POA: Diagnosis not present

## 2021-12-22 DIAGNOSIS — R7401 Elevation of levels of liver transaminase levels: Secondary | ICD-10-CM

## 2021-12-22 DIAGNOSIS — R6520 Severe sepsis without septic shock: Secondary | ICD-10-CM | POA: Diagnosis not present

## 2021-12-22 DIAGNOSIS — G8929 Other chronic pain: Secondary | ICD-10-CM | POA: Diagnosis not present

## 2021-12-22 DIAGNOSIS — M6281 Muscle weakness (generalized): Secondary | ICD-10-CM | POA: Diagnosis not present

## 2021-12-22 DIAGNOSIS — R4701 Aphasia: Secondary | ICD-10-CM | POA: Diagnosis not present

## 2021-12-22 DIAGNOSIS — M2550 Pain in unspecified joint: Secondary | ICD-10-CM | POA: Diagnosis not present

## 2021-12-22 DIAGNOSIS — D751 Secondary polycythemia: Secondary | ICD-10-CM | POA: Diagnosis present

## 2021-12-22 DIAGNOSIS — G9389 Other specified disorders of brain: Secondary | ICD-10-CM | POA: Diagnosis not present

## 2021-12-22 DIAGNOSIS — N3000 Acute cystitis without hematuria: Secondary | ICD-10-CM | POA: Diagnosis present

## 2021-12-22 DIAGNOSIS — B964 Proteus (mirabilis) (morganii) as the cause of diseases classified elsewhere: Secondary | ICD-10-CM | POA: Diagnosis present

## 2021-12-22 DIAGNOSIS — G40919 Epilepsy, unspecified, intractable, without status epilepticus: Secondary | ICD-10-CM | POA: Diagnosis not present

## 2021-12-22 DIAGNOSIS — I5020 Unspecified systolic (congestive) heart failure: Secondary | ICD-10-CM | POA: Diagnosis not present

## 2021-12-22 DIAGNOSIS — R404 Transient alteration of awareness: Secondary | ICD-10-CM | POA: Diagnosis not present

## 2021-12-22 DIAGNOSIS — Z87440 Personal history of urinary (tract) infections: Secondary | ICD-10-CM

## 2021-12-22 DIAGNOSIS — Z803 Family history of malignant neoplasm of breast: Secondary | ICD-10-CM

## 2021-12-22 DIAGNOSIS — G40909 Epilepsy, unspecified, not intractable, without status epilepticus: Secondary | ICD-10-CM

## 2021-12-22 DIAGNOSIS — Z7401 Bed confinement status: Secondary | ICD-10-CM | POA: Diagnosis not present

## 2021-12-22 DIAGNOSIS — R41841 Cognitive communication deficit: Secondary | ICD-10-CM | POA: Diagnosis not present

## 2021-12-22 DIAGNOSIS — R29818 Other symptoms and signs involving the nervous system: Secondary | ICD-10-CM | POA: Diagnosis not present

## 2021-12-22 DIAGNOSIS — I69828 Other speech and language deficits following other cerebrovascular disease: Secondary | ICD-10-CM | POA: Diagnosis not present

## 2021-12-22 DIAGNOSIS — R0989 Other specified symptoms and signs involving the circulatory and respiratory systems: Secondary | ICD-10-CM | POA: Diagnosis not present

## 2021-12-22 DIAGNOSIS — Z86011 Personal history of benign neoplasm of the brain: Secondary | ICD-10-CM

## 2021-12-22 DIAGNOSIS — M1990 Unspecified osteoarthritis, unspecified site: Secondary | ICD-10-CM | POA: Diagnosis present

## 2021-12-22 DIAGNOSIS — N390 Urinary tract infection, site not specified: Secondary | ICD-10-CM

## 2021-12-22 DIAGNOSIS — I5022 Chronic systolic (congestive) heart failure: Secondary | ICD-10-CM | POA: Diagnosis not present

## 2021-12-22 DIAGNOSIS — I6523 Occlusion and stenosis of bilateral carotid arteries: Secondary | ICD-10-CM | POA: Diagnosis not present

## 2021-12-22 DIAGNOSIS — Z923 Personal history of irradiation: Secondary | ICD-10-CM

## 2021-12-22 DIAGNOSIS — Z79899 Other long term (current) drug therapy: Secondary | ICD-10-CM

## 2021-12-22 LAB — BASIC METABOLIC PANEL
Anion gap: 14 (ref 5–15)
BUN: 9 mg/dL (ref 8–23)
CO2: 20 mmol/L — ABNORMAL LOW (ref 22–32)
Calcium: 9.4 mg/dL (ref 8.9–10.3)
Chloride: 99 mmol/L (ref 98–111)
Creatinine, Ser: 0.63 mg/dL (ref 0.44–1.00)
GFR, Estimated: >60 mL/min (ref >60)
Glucose, Bld: 144 mg/dL — ABNORMAL HIGH (ref 70–99)
Potassium: 3.8 mmol/L (ref 3.5–5.1)
Sodium: 133 mmol/L — ABNORMAL LOW (ref 135–145)

## 2021-12-22 LAB — CBC
HCT: 46.5 % — ABNORMAL HIGH (ref 36.0–46.0)
Hemoglobin: 16.4 g/dL — ABNORMAL HIGH (ref 12.0–15.0)
MCH: 31.8 pg (ref 26.0–34.0)
MCHC: 35.3 g/dL (ref 30.0–36.0)
MCV: 90.3 fL (ref 80.0–100.0)
Platelets: 294 10*3/uL (ref 150–400)
RBC: 5.15 MIL/uL — ABNORMAL HIGH (ref 3.87–5.11)
RDW: 12.6 % (ref 11.5–15.5)
WBC: 12.4 10*3/uL — ABNORMAL HIGH (ref 4.0–10.5)
nRBC: 0 % (ref 0.0–0.2)

## 2021-12-22 LAB — URINALYSIS, ROUTINE W REFLEX MICROSCOPIC
Bilirubin Urine: NEGATIVE
Glucose, UA: NEGATIVE mg/dL
Hgb urine dipstick: NEGATIVE
Ketones, ur: NEGATIVE mg/dL
Nitrite: NEGATIVE
Protein, ur: 30 mg/dL — AB
Specific Gravity, Urine: 1.006 (ref 1.005–1.030)
WBC, UA: >50 WBC/hpf — ABNORMAL HIGH (ref 0–5)
pH: 7 (ref 5.0–8.0)

## 2021-12-22 LAB — LACTIC ACID, PLASMA
Lactic Acid, Venous: 2.4 mmol/L (ref 0.5–1.9)
Lactic Acid, Venous: 1.5 mmol/L (ref 0.5–1.9)

## 2021-12-22 LAB — I-STAT CHEM 8, ED
BUN: 24 mg/dL — ABNORMAL HIGH (ref 8–23)
Calcium, Ion: 0.94 mmol/L — ABNORMAL LOW (ref 1.15–1.40)
Chloride: 98 mmol/L (ref 98–111)
Creatinine, Ser: 0.6 mg/dL (ref 0.44–1.00)
Glucose, Bld: 159 mg/dL — ABNORMAL HIGH (ref 70–99)
HCT: 48 % — ABNORMAL HIGH (ref 36.0–46.0)
Hemoglobin: 16.3 g/dL — ABNORMAL HIGH (ref 12.0–15.0)
Potassium: 5.6 mmol/L — ABNORMAL HIGH (ref 3.5–5.1)
Sodium: 124 mmol/L — ABNORMAL LOW (ref 135–145)
TCO2: 20 mmol/L — ABNORMAL LOW (ref 22–32)

## 2021-12-22 LAB — ACETAMINOPHEN LEVEL: Acetaminophen (Tylenol), Serum: <10 ug/mL — ABNORMAL LOW (ref 10–30)

## 2021-12-22 LAB — COMPREHENSIVE METABOLIC PANEL
ALT: 25 U/L (ref 0–44)
AST: 45 U/L — ABNORMAL HIGH (ref 15–41)
Albumin: 4.4 g/dL (ref 3.5–5.0)
Alkaline Phosphatase: 53 U/L (ref 38–126)
Anion gap: 13 (ref 5–15)
BUN: 17 mg/dL (ref 8–23)
CO2: 18 mmol/L — ABNORMAL LOW (ref 22–32)
Calcium: 9.8 mg/dL (ref 8.9–10.3)
Chloride: 96 mmol/L — ABNORMAL LOW (ref 98–111)
Creatinine, Ser: 0.76 mg/dL (ref 0.44–1.00)
GFR, Estimated: >60 mL/min (ref >60)
Glucose, Bld: 159 mg/dL — ABNORMAL HIGH (ref 70–99)
Potassium: 5.5 mmol/L — ABNORMAL HIGH (ref 3.5–5.1)
Sodium: 127 mmol/L — ABNORMAL LOW (ref 135–145)
Total Bilirubin: 0.8 mg/dL (ref 0.3–1.2)
Total Protein: 7 g/dL (ref 6.5–8.1)

## 2021-12-22 LAB — CK: Total CK: 98 U/L (ref 38–234)

## 2021-12-22 LAB — ETHANOL: Alcohol, Ethyl (B): <10 mg/dL (ref <10)

## 2021-12-22 LAB — DIFFERENTIAL
Abs Immature Granulocytes: 0.04 10*3/uL (ref 0.00–0.07)
Basophils Absolute: 0.1 10*3/uL (ref 0.0–0.1)
Basophils Relative: 0 %
Eosinophils Absolute: 0.2 10*3/uL (ref 0.0–0.5)
Eosinophils Relative: 1 %
Immature Granulocytes: 0 %
Lymphocytes Relative: 44 %
Lymphs Abs: 5.4 10*3/uL — ABNORMAL HIGH (ref 0.7–4.0)
Monocytes Absolute: 0.8 10*3/uL (ref 0.1–1.0)
Monocytes Relative: 6 %
Neutro Abs: 6 10*3/uL (ref 1.7–7.7)
Neutrophils Relative %: 49 %

## 2021-12-22 LAB — APTT: aPTT: 22 seconds — ABNORMAL LOW (ref 24–36)

## 2021-12-22 LAB — PROTIME-INR
INR: 0.9 (ref 0.8–1.2)
Prothrombin Time: 12 seconds (ref 11.4–15.2)

## 2021-12-22 LAB — RAPID URINE DRUG SCREEN, HOSP PERFORMED
Amphetamines: NOT DETECTED
Barbiturates: NOT DETECTED
Benzodiazepines: POSITIVE — AB
Cocaine: NOT DETECTED
Opiates: NOT DETECTED
Tetrahydrocannabinol: NOT DETECTED

## 2021-12-22 LAB — MAGNESIUM: Magnesium: 1.8 mg/dL (ref 1.7–2.4)

## 2021-12-22 LAB — TSH: TSH: 0.372 u[IU]/mL (ref 0.350–4.500)

## 2021-12-22 LAB — SALICYLATE LEVEL: Salicylate Lvl: <7 mg/dL — ABNORMAL LOW (ref 7.0–30.0)

## 2021-12-22 MED ORDER — ACETAMINOPHEN 650 MG RE SUPP: 650.0000 mg | Freq: Four times a day (QID) | RECTAL | Status: DC | PRN

## 2021-12-22 MED ORDER — ZINC OXIDE 40 % EX OINT
1.0000 "application " | TOPICAL_OINTMENT | Freq: Every day | CUTANEOUS | Status: DC
  Administered 2021-12-22 – 2021-12-28 (×7): 1 via TOPICAL
  Filled 2021-12-22: qty 57

## 2021-12-22 MED ORDER — NALOXONE HCL 0.4 MG/ML IJ SOLN: 0.4000 mg | INTRAMUSCULAR | Status: DC | PRN

## 2021-12-22 MED ORDER — OXCARBAZEPINE 300 MG PO TABS: 300.0000 mg | ORAL_TABLET | Freq: Two times a day (BID) | ORAL | Status: DC

## 2021-12-22 MED ORDER — SODIUM CHLORIDE 0.9 % IV BOLUS (SEPSIS)
1000.0000 mL | Freq: Once | INTRAVENOUS | Status: AC
Start: 2021-12-22 — End: 2021-12-22
  Administered 2021-12-22: 1000 mL via INTRAVENOUS

## 2021-12-22 MED ORDER — CALCIUM GLUCONATE-NACL 1-0.675 GM/50ML-% IV SOLN
1.0000 g | Freq: Once | INTRAVENOUS | Status: AC
  Administered 2021-12-22: 1000 mg via INTRAVENOUS
  Filled 2021-12-22: qty 50

## 2021-12-22 MED ORDER — POLYETHYLENE GLYCOL 3350 17 G PO PACK
17.0000 g | PACK | ORAL | Status: DC
  Filled 2021-12-22: qty 1

## 2021-12-22 MED ORDER — ALBUTEROL SULFATE (2.5 MG/3ML) 0.083% IN NEBU: 2.5000 mg | INHALATION_SOLUTION | Freq: Four times a day (QID) | RESPIRATORY_TRACT | Status: DC | PRN

## 2021-12-22 MED ORDER — LISINOPRIL 20 MG PO TABS
40.0000 mg | ORAL_TABLET | Freq: Every day | ORAL | Status: DC
  Administered 2021-12-22 – 2021-12-26 (×5): 40 mg via ORAL
  Filled 2021-12-22 (×5): qty 2

## 2021-12-22 MED ORDER — ACETAMINOPHEN 325 MG PO TABS: 650.0000 mg | ORAL_TABLET | Freq: Four times a day (QID) | ORAL | Status: DC | PRN

## 2021-12-22 MED ORDER — HYDRALAZINE HCL 20 MG/ML IJ SOLN: 10.0000 mg | INTRAMUSCULAR | Status: DC | PRN

## 2021-12-22 MED ORDER — ONDANSETRON HCL 4 MG PO TABS: 4.0000 mg | ORAL_TABLET | Freq: Four times a day (QID) | ORAL | Status: DC | PRN

## 2021-12-22 MED ORDER — OXYCODONE-ACETAMINOPHEN 10-325 MG PO TABS: 1.0000 | ORAL_TABLET | Freq: Three times a day (TID) | ORAL | Status: DC | PRN

## 2021-12-22 MED ORDER — DIAZEPAM 5 MG PO TABS
5.0000 mg | ORAL_TABLET | Freq: Every day | ORAL | Status: DC
  Administered 2021-12-22 – 2021-12-29 (×8): 5 mg via ORAL
  Filled 2021-12-22 (×8): qty 1

## 2021-12-22 MED ORDER — OXCARBAZEPINE 300 MG PO TABS
300.0000 mg | ORAL_TABLET | Freq: Every day | ORAL | Status: DC
  Administered 2021-12-22 – 2021-12-24 (×3): 300 mg via ORAL
  Filled 2021-12-22 (×3): qty 1

## 2021-12-22 MED ORDER — ACETAMINOPHEN 325 MG PO TABS
650.0000 mg | ORAL_TABLET | Freq: Four times a day (QID) | ORAL | Status: DC | PRN
  Administered 2021-12-22 – 2021-12-29 (×9): 650 mg via ORAL
  Filled 2021-12-22 (×11): qty 2

## 2021-12-22 MED ORDER — SODIUM CHLORIDE 0.9 % IV SOLN: INTRAVENOUS | Status: DC

## 2021-12-22 MED ORDER — LIDOCAINE 4 % EX CREA
TOPICAL_CREAM | Freq: Every day | CUTANEOUS | Status: DC | PRN
  Filled 2021-12-22: qty 5

## 2021-12-22 MED ORDER — CLOPIDOGREL BISULFATE 75 MG PO TABS
75.0000 mg | ORAL_TABLET | Freq: Every day | ORAL | Status: DC
  Administered 2021-12-22 – 2021-12-29 (×8): 75 mg via ORAL
  Filled 2021-12-22 (×8): qty 1

## 2021-12-22 MED ORDER — ONDANSETRON HCL 4 MG/2ML IJ SOLN
4.0000 mg | Freq: Four times a day (QID) | INTRAMUSCULAR | Status: DC | PRN
  Administered 2021-12-23: 4 mg via INTRAVENOUS
  Filled 2021-12-22 (×2): qty 2

## 2021-12-22 MED ORDER — SODIUM CHLORIDE 0.9% FLUSH
3.0000 mL | Freq: Once | INTRAVENOUS | Status: AC
  Administered 2021-12-22: 3 mL via INTRAVENOUS

## 2021-12-22 MED ORDER — OXYCODONE HCL 5 MG PO TABS
10.0000 mg | ORAL_TABLET | Freq: Three times a day (TID) | ORAL | Status: DC | PRN
  Administered 2021-12-22 – 2021-12-29 (×14): 10 mg via ORAL
  Filled 2021-12-22 (×15): qty 2

## 2021-12-22 MED ORDER — ENOXAPARIN SODIUM 40 MG/0.4ML IJ SOSY
40.0000 mg | PREFILLED_SYRINGE | INTRAMUSCULAR | Status: DC
  Administered 2021-12-22 – 2021-12-29 (×8): 40 mg via SUBCUTANEOUS
  Filled 2021-12-22 (×8): qty 0.4

## 2021-12-22 MED ORDER — MUSCLE RUB 10-15 % EX CREA
TOPICAL_CREAM | CUTANEOUS | Status: DC | PRN
  Filled 2021-12-22: qty 85

## 2021-12-22 MED ORDER — METOPROLOL TARTRATE 50 MG PO TABS
100.0000 mg | ORAL_TABLET | Freq: Two times a day (BID) | ORAL | Status: DC
Start: 2021-12-22 — End: 2021-12-29
  Administered 2021-12-22 – 2021-12-29 (×13): 100 mg via ORAL
  Filled 2021-12-22 (×14): qty 2

## 2021-12-22 MED ORDER — MELATONIN 5 MG PO TABS
5.0000 mg | ORAL_TABLET | Freq: Every day | ORAL | Status: DC
  Administered 2021-12-22 – 2021-12-28 (×7): 5 mg via ORAL
  Filled 2021-12-22 (×7): qty 1

## 2021-12-22 MED ORDER — POLYVINYL ALCOHOL 1.4 % OP SOLN
1.0000 [drp] | Freq: Every day | OPHTHALMIC | Status: DC
  Administered 2021-12-22 – 2021-12-29 (×8): 1 [drp] via OPHTHALMIC
  Filled 2021-12-22: qty 15

## 2021-12-22 MED ORDER — ATORVASTATIN CALCIUM 10 MG PO TABS
10.0000 mg | ORAL_TABLET | Freq: Every day | ORAL | Status: DC
  Administered 2021-12-22 – 2021-12-28 (×7): 10 mg via ORAL
  Filled 2021-12-22 (×7): qty 1

## 2021-12-22 MED ORDER — FENTANYL CITRATE PF 50 MCG/ML IJ SOSY
25.0000 ug | PREFILLED_SYRINGE | Freq: Once | INTRAMUSCULAR | Status: AC
  Administered 2021-12-22: 25 ug via INTRAVENOUS
  Filled 2021-12-22: qty 1

## 2021-12-22 MED ORDER — BACLOFEN 10 MG PO TABS
20.0000 mg | ORAL_TABLET | Freq: Four times a day (QID) | ORAL | Status: DC
  Administered 2021-12-22 – 2021-12-29 (×28): 20 mg via ORAL
  Filled 2021-12-22 (×28): qty 2

## 2021-12-22 MED ORDER — CEFTRIAXONE SODIUM 1 G IJ SOLR
1.0000 g | INTRAMUSCULAR | Status: DC
Start: 2021-12-23 — End: 2021-12-24
  Administered 2021-12-23 – 2021-12-24 (×2): 1 g via INTRAVENOUS
  Filled 2021-12-22 (×2): qty 10

## 2021-12-22 MED ORDER — OXCARBAZEPINE 300 MG PO TABS
600.0000 mg | ORAL_TABLET | Freq: Every day | ORAL | Status: DC
  Administered 2021-12-22 – 2021-12-23 (×2): 600 mg via ORAL
  Filled 2021-12-22 (×2): qty 2

## 2021-12-22 MED ORDER — CEFTRIAXONE SODIUM 1 G IJ SOLR
1.0000 g | Freq: Once | INTRAMUSCULAR | Status: AC
Start: 2021-12-22 — End: 2021-12-22
  Administered 2021-12-22: 1 g via INTRAVENOUS
  Filled 2021-12-22: qty 10

## 2021-12-22 MED ORDER — CARBOXYMETHYLCELLULOSE SODIUM 0.5 % OP SOLN: 1.0000 [drp] | Freq: Every morning | OPHTHALMIC | Status: DC

## 2021-12-22 NOTE — ED Notes (Signed)
Continues to c/o pain in her right ankle and neck heating pad applied.  ?States it makes it feel better.  ?

## 2021-12-22 NOTE — Plan of Care (Addendum)
Neurology plan of care ? ?Please see Dr. Yvetta Coder consult note from earlier today. Patient with chronic paraplegia due to prior stroke and possibly also secondary to known prior meningioma resection, presenting with AMS 2/2 urosepsis. Unable to get MRI 2/2 clips from meningioma resection. Hx of seizures on trileptal.  ? ?rEEG  ?- Continuous slow, generalized and lateralized right hemisphere  ?- Breach artifact, left centro-parietal region  ?- No seizures or epileptiform abnl identified ? ?Suspect presenting sx 2/2 encephalopathy in setting of urosepsis. ? ?Final recommendations: ?- Primary team to f/u trileptal level (results in 2-4 days) ?- Continue home trileptal (NGT if needed) ?- Treatment of active infection per primary team.  ?- No further inpatient neurologic workup recommended at this time, so we will sign off. If mental status does not improve to recent baseline after adequate treatment feel free to reconsult neurology.  ?- F/u with established outpatient neurologist ? ?Stacy Monks, MD ?[facility] ?[phone] ? ?If 7pm- 7am, please page neurology on call as listed in [place]. ? ?

## 2021-12-22 NOTE — Progress Notes (Signed)
Patient's husband is requesting that PT and OT services NOT be involved in care.  ?

## 2021-12-22 NOTE — ED Provider Notes (Signed)
?[place] ?Provider Note ? ? ?CSN: [id number] ?Arrival date & time: 12/22/21  0154 ? ?  ? ?History ? ?Chief Complaint  ?Patient presents with  ? Stroke Symptoms  ?Level 5 caveat due to acuity of condition ? ?Stacy Diaz is a 71 y.o. female. ? ?The history is provided by the EMS personnel. The history is limited by the condition of the patient.  ? ?  ?Patient presents via EMS as a code stroke.  Last known well at 8:30 PM on May 7.  Reported the patient had right-sided deficits With aphasia.  No other details known on arrival. ?Home Medications ?Prior to Admission medications   ?Medication Sig Start Date End Date Taking? Authorizing Provider  ?ASPERCREME LIDOCAINE EX Apply 1 application topically daily as needed (muscle pain).    [provider]  ?atorvastatin (LIPITOR) 10 MG tablet Take 1 tablet (10 mg total) by mouth daily at 6 PM. 10/10/21   Isaac Bliss, Rayford Halsted, MD  ?baclofen (LIORESAL) 20 MG tablet TAKE ONE TABLET BY MOUTH FOUR TIMES DAILY 12/04/21   Isaac Bliss, Rayford Halsted, MD  ?Calcium Carb-Cholecalciferol (CALCIUM CARBONATE-VITAMIN D3 PO) Take 1 tablet by mouth daily after lunch. '600mg'$ / 2500 IU,    [provider]  ?carboxymethylcellulose (REFRESH PLUS) 0.5 % SOLN Place 1 drop into both eyes every morning.    [provider]  ?clopidogrel (PLAVIX) 75 MG tablet Take 1 tablet (75 mg total) by mouth daily. 07/25/21   Isaac Bliss, Rayford Halsted, MD  ?diazepam (VALIUM) 5 MG tablet TAKE ONE TABLET BY MOUTH ONE TIME DAILY 12/17/21   Isaac Bliss, Rayford Halsted, MD  ?lisinopril (ZESTRIL) 40 MG tablet Take 1 tablet (40 mg total) by mouth daily. 07/25/21   Isaac Bliss, Rayford Halsted, MD  ?liver oil-zinc oxide (DESITIN) 40 % ointment Apply 1 application topically at bedtime. Apply to buttocks    [provider]  ?melatonin 5 MG TABS Take 5 mg by mouth at bedtime.    [provider]  ?metoprolol tartrate (LOPRESSOR) 100 MG tablet  Take 1 tablet (100 mg total) by mouth 2 (two) times daily. 08/20/21   Isaac Bliss, Rayford Halsted, MD  ?Multiple Vitamin (MULTIVITAMIN WITH MINERALS) TABS tablet Take 1 tablet by mouth every morning.     [provider]  ?Oxcarbazepine (TRILEPTAL) 300 MG tablet Take 300 mg in am and 600 mg at night 06/02/21   Garvin Fila, MD  ?oxyCODONE-acetaminophen (PERCOCET) 10-325 MG tablet Take 1 tablet by mouth every 8 (eight) hours as needed. for pain 12/10/21   Isaac Bliss, Rayford Halsted, MD  ?polyethylene glycol [facility] Floria Raveling) packet Take 17 g by mouth once a week. Mix with liquid and drink    [provider]  ?atenolol (TENORMIN) 50 MG tablet TAKE 1 & 1/2 TABLET BY MOUTH TWICE DAILY **NEED OFFICE VISIT 02/04/21 02/05/21  Isaac Bliss, Rayford Halsted, MD  ?   ? ?Allergies    ?Patient has no known allergies.   ? ?Review of Systems   ?Review of Systems  ?Unable to perform ROS: Acuity of condition  ? ?Physical Exam ?Updated Vital Signs ?BP (!) 126/96 (BP Location: Right Arm)   Pulse (!) 138   Temp 97.9 ?F (36.6 ?C) (Oral)   Resp (!) 25   Ht 1.651 m ('5\' 5"'$ )   Wt 79.4 kg   SpO2 98%   BMI 29.13 kg/m?  ?Physical Exam ?CONSTITUTIONAL: Chronically ill-appearing ?HEAD: Normocephalic/atraumatic ?EYES: EOMI/PERRL, no nystagmus ?ENMT: Mucous  membranes moist ?NECK: supple no meningeal signs ?CV: S1/S2 noted, tachycardic ?LUNGS: Lungs are clear to auscultation bilaterally, no apparent distress ?ABDOMEN: soft, nondistended ?NEURO: Pt is awake but is not responding to voice.  She is moving her right arm up and down.  Her legs are fully extended and feet are plantarflexed.  Her lower extremities appear rigid ?EXTREMITIES: pulses normal/equal, full ROM, no deformities ?SKIN: warm, color normal ?PSYCH: Unable to assess ?ED Results / Procedures / Treatments   ?Labs ?(all labs ordered are listed, but only abnormal results are displayed) ?Labs Reviewed  ?APTT - Abnormal; Notable for the following components:  ?     Result Value  ? aPTT 22 (*)   ? All other components within normal limits  ?CBC - Abnormal; Notable for the following components:  ? WBC 12.4 (*)   ? RBC 5.15 (*)   ? Hemoglobin 16.4 (*)   ? HCT 46.5 (*)   ? All other components within normal limits  ?DIFFERENTIAL - Abnormal; Notable for the following components:  ? Lymphs Abs 5.4 (*)   ? All other components within normal limits  ?COMPREHENSIVE METABOLIC PANEL - Abnormal; Notable for the following components:  ? Sodium 127 (*)   ? Potassium 5.5 (*)   ? Chloride 96 (*)   ? CO2 18 (*)   ? Glucose, Bld 159 (*)   ? AST 45 (*)   ? All other components within normal limits  ?LACTIC ACID, PLASMA - Abnormal; Notable for the following components:  ? Lactic Acid, Venous 2.4 (*)   ? All other components within normal limits  ?URINALYSIS, ROUTINE W REFLEX MICROSCOPIC - Abnormal; Notable for the following components:  ? APPearance HAZY (*)   ? Protein, ur 30 (*)   ? Leukocytes,Ua LARGE (*)   ? WBC, UA >50 (*)   ? Bacteria, UA RARE (*)   ? All other components within normal limits  ?SALICYLATE LEVEL - Abnormal; Notable for the following components:  ? Salicylate Lvl <8.1 (*)   ? All other components within normal limits  ?ACETAMINOPHEN LEVEL - Abnormal; Notable for the following components:  ? Acetaminophen (Tylenol), Serum <10 (*)   ? All other components within normal limits  ?RAPID URINE DRUG SCREEN, HOSP PERFORMED - Abnormal; Notable for the following components:  ? Benzodiazepines POSITIVE (*)   ? All other components within normal limits  ?I-STAT CHEM 8, ED - Abnormal; Notable for the following components:  ? Sodium 124 (*)   ? Potassium 5.6 (*)   ? BUN 24 (*)   ? Glucose, Bld 159 (*)   ? Calcium, Ion 0.94 (*)   ? TCO2 20 (*)   ? Hemoglobin 16.3 (*)   ? HCT 48.0 (*)   ? All other components within normal limits  ?CULTURE, BLOOD (ROUTINE X 2)  ?CULTURE, BLOOD (ROUTINE X 2)  ?URINE CULTURE  ?PROTIME-INR  ?CK  ?ETHANOL  ?LACTIC ACID, PLASMA  ?CBG MONITORING, ED  ? ? ?EKG ?EKG  Interpretation ? ?Date/Time:  Monday Dec 22 2021 04:01:37 EDT ?Ventricular Rate:  105 ?PR Interval:  186 ?QRS Duration: 94 ?QT Interval:  346 ?QTC Calculation: 457 ?R Axis:   3 ?Text Interpretation: Sinus tachycardia Possible Left atrial enlargement Minimal voltage criteria for LVH, may be normal variant ( Cornell product ) Septal infarct , age undetermined Abnormal ECG Interpretation limited secondary to artifact Confirmed by Ripley Fraise [phone]) on 12/22/2021 4:11:52 AM ? ?Radiology ?CT HEAD CODE STROKE WO CONTRAST ? ?Result Date:  12/22/2021 ?CLINICAL DATA:  Code stroke.  Acute neurologic deficit EXAM: CT HEAD WITHOUT CONTRAST TECHNIQUE: Contiguous axial images were obtained from the base of the skull through the vertex without intravenous contrast. RADIATION DOSE REDUCTION: This exam was performed according to the departmental dose-optimization program which includes automated exposure control, adjustment of the mA and/or kV according to patient size and/or use of iterative reconstruction technique. COMPARISON:  None Available. FINDINGS: Brain: There is no mass, hemorrhage or extra-axial collection. Severe encephalomalacia both superior cerebral hemispheres. There is hypoattenuation of the periventricular white matter, most commonly indicating chronic ischemic microangiopathy. Vascular: No abnormal hyperdensity of the major intracranial arteries or dural venous sinuses. No intracranial atherosclerosis. Skull: Large vertex craniectomy defect Sinuses/Orbits: No fluid levels or advanced mucosal thickening of the visualized paranasal sinuses. No mastoid or middle ear effusion. The orbits are normal. ASPECTS [facility] Stroke Program Early CT Score) - Ganglionic level infarction (caudate, lentiform nuclei, internal capsule, insula, M1-M3 cortex): 7 - Supraganglionic infarction (M4-M6 cortex): 3 Total score (0-10 with 10 being normal): 10 IMPRESSION: 1. No acute intracranial abnormality. 2. ASPECTS is 10. Electronically  Signed   By: Ulyses Jarred M.D.   On: 12/22/2021 02:16   ? ?Procedures ?Marland KitchenCritical Care ?Performed by: Ripley Fraise, MD ?Authorized by: Ripley Fraise, MD  ? ?Critical care provider statement:  ?  Critical ca

## 2021-12-22 NOTE — Evaluation (Signed)
Clinical/Bedside Swallow Evaluation ?Patient Details  ?Name: Stacy Diaz ?MRN: [id number] ?Date of Birth: 09-18-1950 ? ?Today's Date: 12/22/2021 ?Time: SLP Start Time (ACUTE ONLY): 1031 SLP Stop Time (ACUTE ONLY): 8295 ?SLP Time Calculation (min) (ACUTE ONLY): 18 min ? ?Past Medical History:  ?Past Medical History:  ?Diagnosis Date  ? Anxiety   ? Arthritis   ? "probably" (08/25/2012)  ? Breast cancer of lower-outer quadrant of left female breast ([place]) 05/25/2014  ? Headache(784.0)   ? "not real frequent" (08/25/2012)  ? Heart disease   ? HTN (hypertension)   ? Hyperlipidemia   ? Meningioma ([place]) 1999  ? Myocardial infarction [facility]) 1987  ? 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 BLE/notes 08/25/2012  ? S/P radiation therapy 07/30/14-08/30/13  ? left breast cancer/50Gy  ? Seizures ([place])   ? no seizures since 2014  ? Spastic paraparesis   ? S/P meningioma resection 1999/notes 08/25/2012  ? Stroke [facility])   ? x 2, the last 2014  ? ?Past Surgical History:  ?Past Surgical History:  ?Procedure Laterality Date  ? BRAIN MENINGIOMA EXCISION  05/1997  ? BREAST LUMPECTOMY WITH NEEDLE LOCALIZATION AND AXILLARY SENTINEL LYMPH NODE BX Left 06/22/2014  ? Procedure: LEFT WIRE LOCALIZED LUMPECTOPMY AND SENTINEL NODE MAPPING;  Surgeon: Autumn Messing III, MD;  Location: [place];  Service: General;  Laterality: Left;  ? [place]  ? CORONARY ANGIOPLASTY  1987  ? denied intervention: "blockage wasn't bad enough"  ? LEFT HEART CATH AND CORONARY ANGIOGRAPHY N/A 07/08/2021  ? Procedure: LEFT HEART CATH AND CORONARY ANGIOGRAPHY;  Surgeon: Troy Sine, MD;  Location: [place] CV [facility];  Service: Cardiovascular;  Laterality: N/A;  ? TIBIA IM NAIL INSERTION Right 04/26/2018  ? Procedure: INTRAMEDULLARY (IM) NAIL TIBIAL;  Surgeon: Altamese [place], MD;  Location: [place];  Service: Orthopedics;  Laterality: Right;  ? ?HPI:  ?Pt is a 26 female presenting with inability to speak. Code stroke called initially but CTH negative and presentation  more consistent with underlying metabolic process. Not able to have MRI. CXR without focal consolidation. Admitted with sepsis secondary to UTI and acute metabolic encephalopathy. Previous swallow eval in January 2014 recommended regular solids and thin liquids with use of small sips and lingual sweep to clear L pocketing. 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, HTN, HLD, breast ca, seizure d/o, chronic pain, anxiety  ?  ?Assessment / Plan / Recommendation  ?Clinical Impression ? Pt has mild L sided weakness that is suspected to be her baseline given description in previous SLP notes. She has no overt s/s of aspiration or signs of dysphagia, including no L sided loss or pocketing. Would continue with regular solids and thin liquids. Pt will likely need assistance at least with repositioning for safety prior to PO intake, although she would also benefit from help with self-feeding as well. SLP to sign off acutely but please reorder with any acute changes. ? ?SLP Visit Diagnosis: Dysphagia, unspecified (R13.10) ?   ?Aspiration Risk ? Mild aspiration risk  ?  ?Diet Recommendation Regular;Thin liquid  ? ?Liquid Administration via: Cup;Straw ?Medication Administration: Whole meds with liquid ?Supervision: Staff to assist with self feeding ?Compensations: Slow rate;Small sips/bites ?Postural Changes: Seated upright at 90 degrees  ?  ?Other  Recommendations Oral Care Recommendations: Oral care BID   ? ?Recommendations for follow up therapy are one component of a multi-disciplinary discharge planning process, led by the attending physician.  Recommendations may be updated based  on patient status, additional functional criteria and insurance authorization. ? ?Follow up Recommendations No SLP follow up  ? ? ?  ?Assistance Recommended at Discharge    ?Functional Status Assessment Patient has not had a recent decline in their functional status  ?Frequency and Duration    ?  ?  ?    ? ?Prognosis Prognosis for Safe Diet Advancement: Good  ? ?  ? ?Swallow Study   ?General HPI: Pt is a 56 female presenting with inability to speak. Code stroke called initially but CTH negative and presentation more consistent with underlying metabolic process. Not able to have MRI. CXR without focal consolidation. Admitted with sepsis secondary to UTI and acute metabolic encephalopathy. Previous swallow eval in January 2014 recommended regular solids and thin liquids with use of small sips and lingual sweep to clear L pocketing. 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, HTN, HLD, breast ca, seizure d/o, chronic pain, anxiety ?Type of Study: Bedside Swallow Evaluation ?Previous Swallow Assessment: see HPI ?Diet Prior to this Study: Regular;Thin liquids ?Temperature Spikes Noted: No ?Respiratory Status: Room air ?History of Recent Intubation: No ?Behavior/Cognition: Alert;Cooperative;Pleasant mood ?Oral Cavity Assessment: Within Functional Limits ?Oral Care Completed by SLP: No ?Oral Cavity - Dentition: Adequate natural dentition ?Vision: Functional for self-feeding ?Self-Feeding Abilities: Needs assist ?Patient Positioning: Upright in bed ?Baseline Vocal Quality: Normal ?Volitional Cough: Strong ?Volitional Swallow: Able to elicit  ?  ?Oral/Motor/Sensory Function Overall Oral Motor/Sensory Function:  (? mild L nasolabial fold flattening at baseline, would be consistent with hx)   ?Ice Chips Ice chips: Not tested   ?Thin Liquid Thin Liquid: Within functional limits ?Presentation: Straw;Self Fed  ?  ?Nectar Thick Nectar Thick Liquid: Not tested   ?Honey Thick Honey Thick Liquid: Not tested   ?Puree Puree: Within functional limits ?Presentation: Spoon   ?Solid ? ? ?  Solid: Within functional limits ?Presentation: Self Fed  ? ?  ? ?Osie Bond., M.A. CCC-SLP ?Acute Rehabilitation Services ?Office [phone] ? ?Secure chat preferred ? ?12/22/2021,1:06 PM ? ? ? ? ?

## 2021-12-22 NOTE — ED Notes (Signed)
Provider notified of the inability of this RN and fellow RN's inability to obtain new cultures and lactic due to the patient's difficult vasculature ?

## 2021-12-22 NOTE — Consult Note (Addendum)
?                    NEURO HOSPITALIST CONSULT NOTE  ? ?Requestig physician: Dr. Christy Gentles ? ?Reason for Consult: Acute onset of aphasia ? ?History obtained from:  EMS and Chart    ? ?HPI:                                                                                                                                         ? Stacy Diaz is an 71 y.o. female presenting as a Code Stroke via EMS for acute onset of aphasia. LKN was 2030 yesterday when she went to bed. Husband was woken up by her calling out at 0100 this morning and when he tried to speak with her, she was incoherent. EMS was called and on arrival they noted same.  ? ?At baseline she is severely disabled due to prior strokes x 2 as well as prior meningioma resection, and is unable to ambulate, requiring assistance for transfers. EMS states that when they transferred her from bed to stretcher, her BLE remained in a rigid extensor position with no significant movement and requiring maximum assistance.  ? ?On arrival to the ED, she was awake but continued to be mute except for ability to say "no" to some questions, was staring straight ahead with an expressionless face and minimal spontaneous movement.  ? ?She has a history of seizures and is on Trileptal at home for this. Husband did not endorse any seizure activity and none was witnessed by EMS. Glucose was mildly elevated in 120s.  ? ?She has an extensive PMHx as listed below.  ? ?mRS: 4 ? ?Past Medical History:  ?Diagnosis Date  ? Anxiety   ? Arthritis   ? "probably" (08/25/2012)  ? Breast cancer of lower-outer quadrant of left female breast ([place]) 05/25/2014  ? Headache(784.0)   ? "not real frequent" (08/25/2012)  ? Heart disease   ? HTN (hypertension)   ? Hyperlipidemia   ? Meningioma ([place]) 1999  ? Myocardial infarction [facility]) 1987  ? 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 BLE/notes 08/25/2012  ? S/P radiation therapy 07/30/14-08/30/13  ? left breast cancer/50Gy  ? Seizures ([place])   ? no seizures since  2014  ? Spastic paraparesis   ? S/P meningioma resection 1999/notes 08/25/2012  ? Stroke [facility])   ? x 2, the last 2014  ? ? ?Past Surgical History:  ?Procedure Laterality Date  ? BRAIN MENINGIOMA EXCISION  05/1997  ? BREAST LUMPECTOMY WITH NEEDLE LOCALIZATION AND AXILLARY SENTINEL LYMPH NODE BX Left 06/22/2014  ? Procedure: LEFT WIRE LOCALIZED LUMPECTOPMY AND SENTINEL NODE MAPPING;  Surgeon: Autumn Messing III, MD;  Location: [place];  Service: General;  Laterality: Left;  ? [place]  ? CORONARY ANGIOPLASTY  1987  ? denied intervention: "blockage wasn't bad enough"  ? LEFT HEART CATH  AND CORONARY ANGIOGRAPHY N/A 07/08/2021  ? Procedure: LEFT HEART CATH AND CORONARY ANGIOGRAPHY;  Surgeon: Troy Sine, MD;  Location: [place] [facility];  Service: Cardiovascular;  Laterality: N/A;  ? TIBIA IM NAIL INSERTION Right 04/26/2018  ? Procedure: INTRAMEDULLARY (IM) NAIL TIBIAL;  Surgeon: Altamese [place], MD;  Location: [place];  Service: Orthopedics;  Laterality: Right;  ? ? ?Family History  ?Problem Relation Age of Onset  ? Breast cancer Mother 62  ?     unilateral  ? Heart disease Father   ? Breast cancer Paternal Grandmother 104  ?     unilateral  ?          ? ?Social History:  reports that she has quit smoking. Her smoking use included cigarettes. She has a 20.00 pack-year smoking history. She has never used smokeless tobacco. She reports that she does not drink alcohol and does not use drugs. ? ?No Known Allergies ? ?HOME MEDICATIONS:                                                                                                                     ? ?No current facility-administered medications on file prior to encounter.  ? ?Current Outpatient Medications on File Prior to Encounter  ?Medication Sig Dispense Refill  ? ASPERCREME LIDOCAINE EX Apply 1 application topically daily as needed (muscle pain).    ? atorvastatin (LIPITOR) 10 MG tablet Take 1 tablet (10 mg total) by mouth daily at 6 PM. 90 tablet 1  ?  baclofen (LIORESAL) 20 MG tablet TAKE ONE TABLET BY MOUTH FOUR TIMES DAILY 360 tablet 0  ? Calcium Carb-Cholecalciferol (CALCIUM CARBONATE-VITAMIN D3 PO) Take 1 tablet by mouth daily after lunch. '600mg'$ / 2500 IU,    ? carboxymethylcellulose (REFRESH PLUS) 0.5 % SOLN Place 1 drop into both eyes every morning.    ? clopidogrel (PLAVIX) 75 MG tablet Take 1 tablet (75 mg total) by mouth daily. 90 tablet 1  ? diazepam (VALIUM) 5 MG tablet TAKE ONE TABLET BY MOUTH ONE TIME DAILY 90 tablet 0  ? lisinopril (ZESTRIL) 40 MG tablet Take 1 tablet (40 mg total) by mouth daily. 90 tablet 1  ? liver oil-zinc oxide (DESITIN) 40 % ointment Apply 1 application topically at bedtime. Apply to buttocks    ? melatonin 5 MG TABS Take 5 mg by mouth at bedtime.    ? metoprolol tartrate (LOPRESSOR) 100 MG tablet Take 1 tablet (100 mg total) by mouth 2 (two) times daily. 180 tablet 1  ? Multiple Vitamin (MULTIVITAMIN WITH MINERALS) TABS tablet Take 1 tablet by mouth every morning.     ? Oxcarbazepine (TRILEPTAL) 300 MG tablet Take 300 mg in am and 600 mg at night 270 tablet 1  ? oxyCODONE-acetaminophen (PERCOCET) 10-325 MG tablet Take 1 tablet by mouth every 8 (eight) hours as needed. for pain 270 tablet 0  ? polyethylene glycol (MIRALAX / GLYCOLAX) packet Take 17 g by mouth once a week. Mix with liquid and drink    ? [  DISCONTINUED] atenolol (TENORMIN) 50 MG tablet TAKE 1 & 1/2 TABLET BY MOUTH TWICE DAILY **NEED OFFICE VISIT 270 tablet 1  ? ? ? ?ROS:                                                                                                                                       ?Unable to obtain due to aphasia. ? ?BP (!) 126/96 (BP Location: Right Arm)   Pulse (!) 138   Temp 97.9 ?F (36.6 ?C) (Oral)   Resp (!) 25   Ht '5\' 5"'$  (1.651 m)   Wt 79.4 kg   SpO2 98%   BMI 29.13 kg/m?  ? ? ?General Examination:                                                                                                      ? ?Physical Exam  ?HEENT-   Normocephalic  ?Lungs- Respirations unlabored ?Extremities- Chronic atrophic and skin changes to BLE in addition to chronic flexion contractures at ankles and chronic extensor contractures at knees.  ? ? ?Neurological Examination ?Mental Status: Awake with eyes open. No facial expressivity, blank stare, eyes at the midline and not gazing towards and away from visual stimuli. Does not follow any commands. Only verbal output is to say "no" to some questions.  ?Cranial Nerves: ?II: No blink to threat in left temporal visual field. Intact blink to threat in right temporal visual field. PERRL.  ?III,IV, VI: Eyes conjugately at the midline. Does not fixate or track. Does not gaze towards or away from visual stimuli. No nystagmus.  ?V: Blinks to eyelid stimulation bilaterally ?VII: Hypomimia bilaterally in conjunction with decreased NL fold on the left.  ?VIII: Unable to formally assess ?IX,X: Phonates as above ?XI: Head is midline ?XII: Not following commands for testing ?Motor: ?Not following motor commands for any extremity ?RUE: Will elevate antigravity towards chest in response to sternal rub. Bradykinetic ?LUE: Rigid extensor posturing with no movement to any stimuli except for extensor posturing.  ?BLE Bilaterally rigid with chronic flexion contractures at ankles and chronic extensor contractures at knees. No movement to any stimuli except for toe dorsiflexion.  ?Sensory: Reacts with mild delay to right arm pinch, long delay to left arm pinch. Says "no" when asked if she can feel thigh pinch bilaterally.  ?Deep Tendon Reflexes: RUE with 3+ brachioradialis. Unable to elicit LUE reflexes due to rigidity. 0 bilateral patellae and achilles. ?Plantars: Briskly upgoing bilaterally ?Cerebellar: Unable to assess ?Gait: Unable  to assess ? ?NIHSS: 30 ?  ?Lab Results: ?Basic Metabolic Panel: ?Recent Labs  ?Lab 12/22/21 ?0205  ?NA 124*  ?K 5.6*  ?CL 98  ?GLUCOSE 159*  ?BUN 24*  ?CREATININE 0.60  ? ? ?CBC: ?Recent Labs  ?Lab  12/22/21 ?0205  ?HGB 16.3*  ?HCT 48.0*  ? ? ?Cardiac Enzymes: ?No results for input(s): CKTOTAL, CKMB, CKMBINDEX, TROPONINI in the last 168 hours. ? ?Lipid Panel: ?No results for input(s): CHOL, TRIG, HDL, CHOLHDL, VLDL,

## 2021-12-22 NOTE — ED Notes (Signed)
Stroke team and neurologist at bedside at this time ?

## 2021-12-22 NOTE — ED Notes (Signed)
Phlebotomist aware of the need for the patient's blood to be drawn  ?

## 2021-12-22 NOTE — ED Notes (Signed)
Patient transported to CT 

## 2021-12-22 NOTE — Procedures (Signed)
Patient Name: Stacy Diaz  ?MRN: [id number]  ?Epilepsy Attending: Lora Havens  ?Referring Physician/Provider: Derek Jack, MD ?Date: 12/22/2021 ?Duration: 21.35 mins ? ?Patient history: 71 y.o. female with an extensive PMHx including breast cancer, prior meningioma resection, stroke x 2, chronic paraplegia and seizures, who presents as a Code Stroke via EMS for acute onset of aphasia. EEG to evaluate for seizure ? ?Level of alertness: Awake ? ?AEDs during EEG study: Valium, OXC ? ?Technical aspects: This EEG study was done with scalp electrodes positioned according to the 10-20 International system of electrode placement. Electrical activity was acquired at a sampling rate of '500Hz'$  and reviewed with a high frequency filter of '70Hz'$  and a low frequency filter of '1Hz'$ . EEG data were recorded continuously and digitally stored.  ? ?Description: The posterior dominant rhythm consists of 7 Hz activity of moderate voltage (25-35 uV) seen predominantly in posterior head regions, asymmetric ( right<left) and reactive to eye opening and eye closing. Drowsiness was characterized by attenuation of the posterior background rhythm. EEG showed continuous generalized and lateralized right hemisphere 3-'6hz'$  theta-delta slowing. The slowing appear sharply contoured with higher amplitude in left centro-parietal region consistent with underlying breach artifact. Hyperventilation and photic stimulation were not performed.    ? ?ABNORMALITY ?- Continuous slow, generalized and lateralized right hemisphere  ?- Breach artifact, left centro-parietal region  ? ?IMPRESSION: ?This study is suggestive of cortical dysfunction arising from left centro-parietal region consistent with prior craniotomy. There is also cortical dysfunction arising from  right hemisphere likely du to underlying structural abnormality.   ?Additionally there is moderate diffuse encephalopathy, nonspecific etiology. No seizures or definite epileptiform discharges  were seen throughout the recording. ? ?Lora Havens  ? ?

## 2021-12-22 NOTE — ED Notes (Signed)
Attempting to draw blood at this time however patient has difficult vasculature this RN has stuck the patient twice as well Kuch RN without success ?

## 2021-12-22 NOTE — ED Notes (Signed)
Provider reports that a purewick may be used at this time for urine sample  ?

## 2021-12-22 NOTE — Progress Notes (Signed)
EEG complete - results pending 

## 2021-12-22 NOTE — H&P (Addendum)
?History and Physical  ? ? ?Patient: Stacy Diaz XFG:[id number] DOB: [birth year] ?DOA: 12/22/2021 ?DOS: the patient was seen and examined on 12/22/2021 ?PCP: Isaac Bliss, Rayford Halsted, MD  ?Patient coming from: Home ? ?Chief Complaint:  ?Chief Complaint  ?Patient presents with  ? Stroke Symptoms  ? ?HPI: Stacy Diaz is a 71 y.o. female with medical history significant of hypertension, hyperlipidemia, CVA, CAD, HFrEF,  meningioma  in 1998 s/p resection with residual paraplegia, breast cancer, seizure disorder, chronic pain, and hyponatremia who presents due to complaints of inability to speak.  Much of the patient's history is provided from her husband over the phone.  Stacy Diaz last known normal was around 20:30 last night when he put her to bed. At baseline patient is totally dependent, uses a powerchair, wears AFO, uses a pivot disk, and can bear weight(unable to balance or move legs).  She states in a separate room in hospital bed and sometime around 1 AM he heard the dog barking and his wife was calling out his name.  When he went to go check on her she just kept saying his name, but was not able to answer any questions for which he called EMS.  He notes that she normally is able to move the left arm, but has not been moving it as much over the last year or so.  She is followed by Dr. Willaim Rayas of neurology for which they have been trying to figure out if she is having seizures, TIA, or strokes.  She is unable to have MRI due to surgical clips following meningioma surgery.  This morning he stated that her head was turned to the right and she was not able to move it to the left side.  She complained of a right temporal headache, neck pain, right ankle pain, and left wrist pain.  Denies having any chest pain, nausea, vomiting, diarrhea, or dysuria.  Her husband does make it seem like she has had intermittent episodes where she had been altered temporarily in the past, but never to this extent and would  usually resolve within a couple minutes or so. ? ?Upon admission into the emergency department patient was seen as a code stroke and evaluated by Dr. Cheral Marker of neurology.  CT scan of the head did not note any acute abnormality. Code stroke was canceled as presentation more consistent with underlying metabolic process.  Patient is unable to have MRI due to surgical clips from meningioma resection.  Noted to be afebrile with pulse 95-1 39, respiration 12-31, blood pressures 126/96 to 192/95, and O2 saturations maintained on room air.  Labs significant for WBC 12.7, hemoglobin 16.4, sodium 127, potassium 5.5, chloride 96, CO2 18, BUN 17, creatinine 0.76, glucose 159, AST 45, and lactic acid 2.4.  Chest x-ray noted mild cardiomegaly with mild central venous congestion no focal consolidation. Acetaminophen and salicylate levels were undetectable.  UDS positive for benzodiazepines.  Urinalysis noted large leukocytes with rare bacteria, and greater than 50 WBCs.  Blood and urine cultures have been obtained.  Patient was bolused 2 L of normal saline IV fluids, given 1 g of calcium gluconate, fentanyl 25 mcg IV, and Rocephin IV.   Neurology had placed orders for EEG. ? ?Review of Systems: As mentioned in the history of present illness. All other systems reviewed and are negative. ?Past Medical History:  ?Diagnosis Date  ? Anxiety   ? Arthritis   ? "probably" (08/25/2012)  ? Breast cancer of lower-outer quadrant of left  female breast ([place]) 05/25/2014  ? Headache(784.0)   ? "not real frequent" (08/25/2012)  ? Heart disease   ? HTN (hypertension)   ? Hyperlipidemia   ? Meningioma ([place]) 1999  ? Myocardial infarction [facility]) 1987  ? 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 BLE/notes 08/25/2012  ? S/P radiation therapy 07/30/14-08/30/13  ? left breast cancer/50Gy  ? Seizures ([place])   ? no seizures since 2014  ? Spastic paraparesis   ? S/P meningioma resection 1999/notes 08/25/2012  ? Stroke [facility])   ? x 2, the last 2014  ? ?Past Surgical  History:  ?Procedure Laterality Date  ? BRAIN MENINGIOMA EXCISION  05/1997  ? BREAST LUMPECTOMY WITH NEEDLE LOCALIZATION AND AXILLARY SENTINEL LYMPH NODE BX Left 06/22/2014  ? Procedure: LEFT WIRE LOCALIZED LUMPECTOPMY AND SENTINEL NODE MAPPING;  Surgeon: Autumn Messing III, MD;  Location: [place];  Service: General;  Laterality: Left;  ? [place]  ? CORONARY ANGIOPLASTY  1987  ? denied intervention: "blockage wasn't bad enough"  ? LEFT HEART CATH AND CORONARY ANGIOGRAPHY N/A 07/08/2021  ? Procedure: LEFT HEART CATH AND CORONARY ANGIOGRAPHY;  Surgeon: Troy Sine, MD;  Location: [place] CV LAB;  Service: Cardiovascular;  Laterality: N/A;  ? TIBIA IM NAIL INSERTION Right 04/26/2018  ? Procedure: INTRAMEDULLARY (IM) NAIL TIBIAL;  Surgeon: Altamese Verdon, MD;  Location: [place];  Service: Orthopedics;  Laterality: Right;  ? ?Social History:  reports that she has quit smoking. Her smoking use included cigarettes. She has a 20.00 pack-year smoking history. She has never used smokeless tobacco. She reports that she does not drink alcohol and does not use drugs. ? ?No Known Allergies ? ?Family History  ?Problem Relation Age of Onset  ? Breast cancer Mother 17  ?     unilateral  ? Heart disease Father   ? Breast cancer Paternal Grandmother 58  ?     unilateral  ? ? ?Prior to Admission medications   ?Medication Sig Start Date End Date Taking? Authorizing Provider  ?ASPERCREME LIDOCAINE EX Apply 1 application topically daily as needed (muscle pain).   Yes [provider]  ?atorvastatin (LIPITOR) 10 MG tablet Take 1 tablet (10 mg total) by mouth daily at 6 PM. 10/10/21  Yes Isaac Bliss, Rayford Halsted, MD  ?baclofen (LIORESAL) 20 MG tablet TAKE ONE TABLET BY MOUTH FOUR TIMES DAILY 12/04/21  Yes Isaac Bliss, Rayford Halsted, MD  ?Calcium Carb-Cholecalciferol (CALCIUM CARBONATE-VITAMIN D3 PO) Take 1 tablet by mouth daily after lunch. '600mg'$ / 2500 IU,   Yes [provider]  ?carboxymethylcellulose  (REFRESH PLUS) 0.5 % SOLN Place 1 drop into both eyes every morning.   Yes [provider]  ?clopidogrel (PLAVIX) 75 MG tablet Take 1 tablet (75 mg total) by mouth daily. 07/25/21  Yes Isaac Bliss, Rayford Halsted, MD  ?diazepam (VALIUM) 5 MG tablet TAKE ONE TABLET BY MOUTH ONE TIME DAILY 12/17/21  Yes Isaac Bliss, Rayford Halsted, MD  ?lisinopril (ZESTRIL) 40 MG tablet Take 1 tablet (40 mg total) by mouth daily. 07/25/21  Yes Isaac Bliss, Rayford Halsted, MD  ?liver oil-zinc oxide (DESITIN) 40 % ointment Apply 1 application topically at bedtime. Apply to buttocks   Yes [provider]  ?melatonin 5 MG TABS Take 5 mg by mouth at bedtime.   Yes [provider]  ?metoprolol tartrate (LOPRESSOR) 100 MG tablet Take 1 tablet (100 mg total) by mouth 2 (two) times daily. 08/20/21  Yes Erline Hau, MD  ?Multiple  Vitamin (MULTIVITAMIN WITH MINERALS) TABS tablet Take 1 tablet by mouth every morning.    Yes [provider]  ?Oxcarbazepine (TRILEPTAL) 300 MG tablet Take 300 mg in am and 600 mg at night 06/02/21  Yes Garvin Fila, MD  ?oxyCODONE-acetaminophen (PERCOCET) 10-325 MG tablet Take 1 tablet by mouth every 8 (eight) hours as needed. for pain 12/10/21  Yes Isaac Bliss, Rayford Halsted, MD  ?polyethylene glycol [facility] / GLYCOLAX) packet Take 17 g by mouth once a week. Mix with liquid and drink   Yes [provider]  ?atenolol (TENORMIN) 50 MG tablet TAKE 1 & 1/2 TABLET BY MOUTH TWICE DAILY **NEED OFFICE VISIT 02/04/21 02/05/21  Isaac Bliss, Rayford Halsted, MD  ? ? ?Physical Exam: ?Vitals:  ? 12/22/21 0430 12/22/21 0500 12/22/21 0530 12/22/21 0600  ?BP: (!) 152/95 (!) 127/91 (!) 192/95 (!) 175/106  ?Pulse: (!) 101 95 100 (!) 122  ?Resp: (!) 26 (!) 24 (!) 31 (!) 27  ?Temp:      ?TempSrc:      ?SpO2: 95% 96% 96% 96%  ?Weight:      ?Height:      ? ?Constitutional: Elderly female no acute distress ?Eyes: PERRL, lids and conjunctivae normal.  Right gaze preference with inability to  cross midline.  Patient appears unable to see on the left visual field. ?ENMT: Mucous membranes are moist. Posterior pharynx clear of any exudate or lesions.   ?Neck: Neck turn to the right unable to turn past mi

## 2021-12-22 NOTE — Progress Notes (Signed)
?  Carryover admission to the Day Admitter.  I discussed this case with the EDP, Dr.Wickline.  Per these discussions: ? ?This is a 71 year old female with paraplegia in the setting of previous meningioma resection, who is being admitted for acute metabolic encephalopathy in the setting of severe sepsis due to urinary tract infection, with presenting labs also notable for mild hyperkalemia.  ? ?The patient reportedly awoke this morning confused, with potential left arm weakness, and was brought to the ED as a code stroke.  Dr. Cheral Marker of neurology evaluated the patient in the ED, and felt that presentation was more consistent with an underlying metabolic process, and canceled code stroke.  Ensuing CT head reportedly showed no evidence of acute intracranial process. ? ?She reportedly has had multiple previous similar hospitalizations for acute encephalopathy.  Of note, she is reportedly not a candidate to undergo MRI of the brain due to residual clips stemming from her previous meningioma resection.  She does have a history of seizures, and is on Trileptal for this purpose, but no reported evidence of seizure-like activity this evening. ? ?Vital signs reportedly notable for mild tachycardia We will labs notable for leukocytosis as well as urinary tract infection.  Initial lactate mildly elevated at 2.4 thereby meeting criteria for severe sepsis due to UTI.  Started on Rocephin.  Repeat lactate pending.  Received 1 L NS bolus, following which tachycardia has been improving.  Additional labs notable for serum potassium level of 5.5, while i-STAT ionized calcium level low.  Blood cultures x2 collected as well as urine culture. ? ?I have placed an order for inpatient admission to PCU for further evaluation management of acute metabolic encephalopathy in the setting of severe sepsis due to UTI. ? ?I have placed some additional preliminary admit orders via the adult multi-morbid admission order set. I have also ordered  n.p.o., as well as as daily Rocephin.  Continuous NS pending updated lactate result.  Additionally, for the patient's mild hyperkalemia, I also ordered calcium gluconate 1 g IV x1 dose, and ordered repeat BMP to be checked at 8 AM this morning.  Added on serum magnesium level. ? ? ? ?Babs Bertin, DO ?Hospitalist ? ?

## 2021-12-22 NOTE — ED Triage Notes (Signed)
LKW 2030 right sided deficits significant neuro history ?

## 2021-12-23 ENCOUNTER — Encounter: Payer: Self-pay | Admitting: Neurology

## 2021-12-23 DIAGNOSIS — R6520 Severe sepsis without septic shock: Secondary | ICD-10-CM | POA: Diagnosis not present

## 2021-12-23 DIAGNOSIS — R7401 Elevation of levels of liver transaminase levels: Secondary | ICD-10-CM

## 2021-12-23 DIAGNOSIS — A419 Sepsis, unspecified organism: Secondary | ICD-10-CM | POA: Diagnosis not present

## 2021-12-23 LAB — COMPREHENSIVE METABOLIC PANEL
ALT: 24 U/L (ref 0–44)
AST: 35 U/L (ref 15–41)
Albumin: 3.9 g/dL (ref 3.5–5.0)
Alkaline Phosphatase: 45 U/L (ref 38–126)
Anion gap: 11 (ref 5–15)
BUN: 11 mg/dL (ref 8–23)
CO2: 19 mmol/L — ABNORMAL LOW (ref 22–32)
Calcium: 9.1 mg/dL (ref 8.9–10.3)
Chloride: 96 mmol/L — ABNORMAL LOW (ref 98–111)
Creatinine, Ser: 0.69 mg/dL (ref 0.44–1.00)
GFR, Estimated: >60 mL/min (ref >60)
Glucose, Bld: 130 mg/dL — ABNORMAL HIGH (ref 70–99)
Potassium: 3.6 mmol/L (ref 3.5–5.1)
Sodium: 126 mmol/L — ABNORMAL LOW (ref 135–145)
Total Bilirubin: 0.9 mg/dL (ref 0.3–1.2)
Total Protein: 6.7 g/dL (ref 6.5–8.1)

## 2021-12-23 LAB — CBC
HCT: 41.3 % (ref 36.0–46.0)
Hemoglobin: 14.4 g/dL (ref 12.0–15.0)
MCH: 30.7 pg (ref 26.0–34.0)
MCHC: 34.9 g/dL (ref 30.0–36.0)
MCV: 88.1 fL (ref 80.0–100.0)
Platelets: 280 10*3/uL (ref 150–400)
RBC: 4.69 MIL/uL (ref 3.87–5.11)
RDW: 12.7 % (ref 11.5–15.5)
WBC: 14.8 10*3/uL — ABNORMAL HIGH (ref 4.0–10.5)
nRBC: 0 % (ref 0.0–0.2)

## 2021-12-23 LAB — 10-HYDROXYCARBAZEPINE: Triliptal/MTB(Oxcarbazepin): 20 ug/mL (ref 10–35)

## 2021-12-23 NOTE — Progress Notes (Signed)
OT Cancellation Note ? ?Patient Details ?Name: Stacy Diaz ?MRN: [id number] ?DOB: 1951/07/20 ? ? ?Cancelled Treatment:    Reason Eval/Treat Not Completed: Other (comment) (Per 5/8 NSG note, pt's spouse requesting that PT/OT not be involved in pt care. Will s/o. Please reconsult if there is a change in pt/family request.) ? ?Lynnda Child, OTD, OTR/L ?Acute Rehab ?[phone] ? ? ?Kaylyn Lim ?12/23/2021, 10:05 AM ?

## 2021-12-23 NOTE — Assessment & Plan Note (Addendum)
Continue statin. 

## 2021-12-23 NOTE — Assessment & Plan Note (Signed)
Resolved with fluids °

## 2021-12-23 NOTE — TOC Initial Note (Signed)
Transition of Care (TOC) - Initial/Assessment Note  ? ? ?Patient Details  ?Name: Stacy Diaz ?MRN: [id number] ?Date of Birth: [birth year] ? ?Transition of Care (TOC) CM/SW Contact:    ?[name], RN ?Phone Number: ?12/23/2021, 11:25 AM ? ?Clinical Narrative:                 ?CM met with the patient and her spouse at the bedside. Spouse states she was able to assist with transfers at home and feed self. She is currently not at this level. Spouse is interested in SNF rehab to get her stronger and back to the level she was prior to hospitalization.  ?She has needed DME at home.  ?Spouse manages her medications.  ?Awaiting PT/OT evals.  ? ?Expected Discharge Plan: [place] ?Barriers to Discharge: Continued Medical Work up ? ? ?Patient Goals and CMS Choice ?  ?CMS Medicare.gov Compare Post Acute Care list provided to:: Patient Represenative (must comment) ?Choice offered to / list presented to : Patient, Spouse ? ?Expected Discharge Plan and Services ?Expected Discharge Plan: [place] ?  ?Discharge Planning Services: CM Consult ?Post Acute Care Choice: [place] ?Living arrangements for the past 2 months: [place] ?                ?  ?  ?  ?  ?  ?  ?  ?  ?  ?  ? ?Prior Living Arrangements/Services ?Living arrangements for the past 2 months: [place] ?Lives with:: Spouse ?Patient language and need for interpreter reviewed:: Yes ?Do you feel safe going back to the place where you live?: Yes      ?Need for Family Participation in Patient Care: Yes (Comment) ?Care giver support system in place?: Yes (comment) ?Current home services: DME (needed DME) ?Criminal Activity/Legal Involvement Pertinent to Current Situation/Hospitalization: No - Comment as needed ? ?Activities of Daily Living ?Home Assistive Devices/Equipment: Bedside commode/3-in-1 ?ADL Screening (condition at time of admission) ?Patient's cognitive ability adequate to safely complete daily  activities?: No ?Is the patient deaf or have difficulty hearing?: No ?Does the patient have difficulty seeing, even when wearing glasses/contacts?: No ?Does the patient have difficulty concentrating, remembering, or making decisions?: No ?Patient able to express need for assistance with ADLs?: Yes ?Does the patient have difficulty dressing or bathing?: No ?Independently performs ADLs?: No ?Does the patient have difficulty walking or climbing stairs?: Yes ?Weakness of Legs: Both ?Weakness of Arms/Hands: Both ? ?Permission Sought/Granted ?  ?  ?   ?   ?   ?   ? ?Emotional Assessment ?Appearance:: Appears stated age ?  ?  ?Orientation: : Oriented to Self, Oriented to Place ?  ?Psych Involvement: No (comment) ? ?Admission diagnosis:  Dehydration [E86.0] ?Weakness [R53.1] ?Acute cystitis without hematuria [N30.00] ?Acute metabolic encephalopathy [G25.42] ?Patient Active Problem List  ? Diagnosis Date Noted  ? Hypocalcemia 12/23/2021  ? Transaminitis 12/23/2021  ? Acute metabolic encephalopathy 70/62/3762  ? Severe sepsis ([place]) 12/22/2021  ? CAD (coronary artery disease) 12/22/2021  ? Chronic pain 12/22/2021  ? Polycythemia 12/22/2021  ? Hyperkalemia 12/22/2021  ? Chronic systolic CHF (congestive heart failure) ([place]) 12/22/2021  ? Hyponatremia 09/24/2021  ? Abnormal nuclear stress test   ? TIA (transient ischemic attack)   ? Stroke ([place]) 04/25/2021  ? Osteoporosis 09/02/2018  ? Paraplegia ([place]) 07/26/2018  ? Urinary tract infection 04/28/2018  ? Closed fracture of right tibia and fibula 04/26/2018  ? Acute right-sided weakness 06/02/2016  ?  History of resection of meningioma 06/02/2016  ? Family history of breast cancer 06/07/2014  ? Breast cancer of lower-outer quadrant of left female breast ([place]) 05/25/2014  ? Partial symptomatic epilepsy with complex partial seizures, not intractable, without status epilepticus ([place]) 04/29/2014  ? History of stroke 04/29/2014  ? Seizure disorder ([place]) 09/04/2012  ? Recent UTI (urinary  tract infection) 08/26/2012  ? Paralysis of upper limb ([place]) 08/25/2012  ? BENIGN NEOPLASM OF BRAIN 03/31/2010  ? INJURY, NERVE, PELVIS/LWR LIMB NOS 05/20/2007  ? Hyperlipidemia 05/12/2007  ? Essential hypertension 05/12/2007  ? PREMATURE VENTRICULAR CONTRACTIONS 05/12/2007  ? ?PCP:  Isaac Bliss, Rayford Halsted, MD ?Pharmacy:   ?[facility] # [address], [place] ?[address] [place] ?[place] [address] ?Phone: [phone] Fax: [phone] ? ?[id number] [place] [facility] [id number]?Phone: [phone]: [phone]? ? ? ? ?Social Determinants of Health (SDOH) Interventions ?  ? ?Readmission Risk Interventions ?   ? View : No data to display.  ?  ?  ?  ? ? ? ?

## 2021-12-23 NOTE — Evaluation (Signed)
Clinical/Bedside Swallow Evaluation ?Patient Details  ?Name: Stacy Diaz ?MRN: [id number] ?Date of Birth: 03-04-1951 ? ?Today's Date: 12/23/2021 ?Time: SLP Start Time (ACUTE ONLY): 2993 SLP Stop Time (ACUTE ONLY): 1153 ?SLP Time Calculation (min) (ACUTE ONLY): 20 min ? ?Past Medical History:  ?Past Medical History:  ?Diagnosis Date  ? Anxiety   ? Arthritis   ? "probably" (08/25/2012)  ? Breast cancer of lower-outer quadrant of left female breast ([place]) 05/25/2014  ? Headache(784.0)   ? "not real frequent" (08/25/2012)  ? Heart disease   ? HTN (hypertension)   ? Hyperlipidemia   ? Meningioma ([place]) 1999  ? Myocardial infarction [facility]) 1987  ? 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 BLE/notes 08/25/2012  ? S/P radiation therapy 07/30/14-08/30/13  ? left breast cancer/50Gy  ? Seizures ([place])   ? no seizures since 2014  ? Spastic paraparesis   ? S/P meningioma resection 1999/notes 08/25/2012  ? Stroke [facility])   ? x 2, the last 2014  ? ?Past Surgical History:  ?Past Surgical History:  ?Procedure Laterality Date  ? BRAIN MENINGIOMA EXCISION  05/1997  ? BREAST LUMPECTOMY WITH NEEDLE LOCALIZATION AND AXILLARY SENTINEL LYMPH NODE BX Left 06/22/2014  ? Procedure: LEFT WIRE LOCALIZED LUMPECTOPMY AND SENTINEL NODE MAPPING;  Surgeon: Autumn Messing III, MD;  Location: [place];  Service: General;  Laterality: Left;  ? [place]  ? CORONARY ANGIOPLASTY  1987  ? denied intervention: "blockage wasn't bad enough"  ? LEFT HEART CATH AND CORONARY ANGIOGRAPHY N/A 07/08/2021  ? Procedure: LEFT HEART CATH AND CORONARY ANGIOGRAPHY;  Surgeon: Troy Sine, MD;  Location: [place] CV LAB;  Service: Cardiovascular;  Laterality: N/A;  ? TIBIA IM NAIL INSERTION Right 04/26/2018  ? Procedure: INTRAMEDULLARY (IM) NAIL TIBIAL;  Surgeon: Altamese [place], MD;  Location: [place];  Service: Orthopedics;  Laterality: Right;  ? ?HPI:  ?Pt is a 51 female presenting with inability to speak. Code stroke called initially but CTH negative and presentation  more consistent with underlying metabolic process. Not able to have MRI. CXR without focal consolidation. Admitted with sepsis secondary to UTI and acute metabolic encephalopathy. Previous swallow eval in January 2014 recommended regular solids and thin liquids with use of small sips and lingual sweep to clear L pocketing. Evaluated initially in the ED 5/8 recommending regular solids and thin liquids, but pt was noted to be coughing on thin liquids 5/9 so SLP was reordered.  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, HTN, HLD, breast ca, seizure d/o, chronic pain, anxiety  ?  ?Assessment / Plan / Recommendation  ?Clinical Impression ? Pt's presentation is similar to previous date, although husband is present at bedside today to provide further clarification of PLOF. He shares that her L facial weakness is changed from her baseline (normally symmetric), and at home she feeds herself. Today she can participate in self-feeding but cognitively needs assistance to do so. This morning he says that she was coughing while drinking thin liquids, which she did in rapid sequential boluses. RN says she was also coughing with thin liquids when taking meds. No overt coughing is noted with SLP, even as she takes bigger sips. Discussed with husband and think it would be reasonable to use some simple precautions, such as cueing her for a slower rate and providing meds whole in puree. Would try to provide optimal positioning for PO intake as well. Suspect that her mentation could also be playing a role in her  swallowing especially if it appears to be more intermittent.  ? ?Pt's husband voices concern about her altered mentation and speech, saying that despite improvements he has seen from yesterday, it is still a significant change for her. He asked about SLP help as she has previously benefited from this before. Education was provided about how UTI can also contribute to changes and that time is  important, but will reach out to MD to consider cognitive evaluation. Discussed with MD after this eval and he is in agreemnt with proceeding with functionally-based cognitive evaluation, so will f/u for this and ongoing dysphagia management. ?SLP Visit Diagnosis: Dysphagia, unspecified (R13.10) ?   ?Aspiration Risk ? Mild aspiration risk  ?  ?Diet Recommendation Regular;Thin liquid  ? ?Liquid Administration via: Cup;Straw ?Medication Administration: Whole meds with puree ?Supervision: Staff to assist with self feeding ?Compensations: Slow rate;Small sips/bites ?Postural Changes: Seated upright at 90 degrees  ?  ?Other  Recommendations Oral Care Recommendations: Oral care BID   ? ?Recommendations for follow up therapy are one component of a multi-disciplinary discharge planning process, led by the attending physician.  Recommendations may be updated based on patient status, additional functional criteria and insurance authorization. ? ?Follow up Recommendations No SLP follow up  ? ? ?  ?Assistance Recommended at Discharge Frequent or constant Supervision/Assistance  ?Functional Status Assessment Patient has had a recent decline in their functional status and demonstrates the ability to make significant improvements in function in a reasonable and predictable amount of time.  ?Frequency and Duration min 2x/week  ?1 week ?  ?   ? ?Prognosis Prognosis for Safe Diet Advancement: Good ?Barriers to Reach Goals: Cognitive deficits  ? ?  ? ?Swallow Study   ?General HPI: Pt is a 15 female presenting with inability to speak. Code stroke called initially but CTH negative and presentation more consistent with underlying metabolic process. Not able to have MRI. CXR without focal consolidation. Admitted with sepsis secondary to UTI and acute metabolic encephalopathy. Previous swallow eval in January 2014 recommended regular solids and thin liquids with use of small sips and lingual sweep to clear L pocketing. Evaluated initially  in the ED 5/8 recommending regular solids and thin liquids, but pt was noted to be coughing on thin liquids 5/9 so SLP was reordered.  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, HTN, HLD, breast ca, seizure d/o, chronic pain, anxiety ?Type of Study: Bedside Swallow Evaluation ?Previous Swallow Assessment: see HPI ?Diet Prior to this Study: Regular;Thin liquids ?Temperature Spikes Noted: No ?Respiratory Status: Room air ?History of Recent Intubation: No ?Behavior/Cognition: Alert;Cooperative;Pleasant mood ?Oral Cavity Assessment: Within Functional Limits ?Oral Care Completed by SLP: No ?Oral Cavity - Dentition: Adequate natural dentition ?Vision: Functional for self-feeding ?Self-Feeding Abilities: Needs assist ?Patient Positioning: Upright in bed ?Baseline Vocal Quality: Normal ?Volitional Cough: Strong ?Volitional Swallow: Able to elicit  ?  ?Oral/Motor/Sensory Function Overall Oral Motor/Sensory Function: Mild impairment ?Facial ROM: Reduced left (husband says this had resolved since previous CVA) ?Facial Symmetry: Abnormal symmetry left ?Facial Strength: Reduced left ?Lingual ROM: Within Functional Limits ?Lingual Symmetry: Within Functional Limits ?Lingual Strength: Reduced (bilaterally)   ?Ice Chips Ice chips: Not tested   ?Thin Liquid Thin Liquid: Within functional limits ?Presentation: Straw;Self Fed  ?  ?Nectar Thick Nectar Thick Liquid: Not tested   ?Honey Thick Honey Thick Liquid: Not tested   ?Puree Puree: Within functional limits ?Presentation: Spoon   ?Solid ? ? ?  Solid: Within functional limits ?Presentation: Self Fed  ? ?  ? ?  Osie Bond., M.A. CCC-SLP ?Acute Rehabilitation Services ?Office [phone] ? ?Secure chat preferred ? ?12/23/2021,2:34 PM ? ? ? ?

## 2021-12-23 NOTE — Assessment & Plan Note (Signed)
Stable, chronic. ?

## 2021-12-23 NOTE — Evaluation (Signed)
Occupational Therapy Evaluation ?Patient Details ?Name: Stacy Diaz ?MRN: [id number] ?DOB: 1951-07-05 ?Today's Date: 12/23/2021 ? ? ?History of Present Illness Stacy Diaz is a 71 y.o. female who present to ED with confusion, inability to speak and L UE weakness. CT negative for stroke. Pt found to have UTI.  PMH: hypertension, hyperlipidemia, CVA, CAD, HFrEF,  meningioma  in 1998 s/p resection with residual paraplegia, breast cancer, seizure disorder, chronic pain, and hyponatremia  ? ?Clinical Impression ?  ?Pt seen for OT eval, spouse providing PLOF/home set up info. Pt independent with seated ADLs including UB dressing, grooming, and self-feeding. Spouse was assisting with stand pivot transfers using pivot disc, LB dressing, toileting, bathing, and IADLs. Has aide that comes 6 days a week x2 hours to assist with ADLs. Pt currently requiring mod -total A +2 for ADLs, total A+2 for bed mobility. Pt able to sit EOB with mod support, L lateral and posterior lean when fatigued.  Pt requires increased verbal/tactile cuing for sequencing throughout session. Pt displays no AROM in LUE, PROM WFL. RUE weakness, decreased grasp-release. Pt presenting with impairments listed below, will follow acutely. Recommend SNF at d/c. ?   ? ?Recommendations for follow up therapy are one component of a multi-disciplinary discharge planning process, led by the attending physician.  Recommendations may be updated based on patient status, additional functional criteria and insurance authorization.  ? ?Follow Up Recommendations ? Skilled nursing-short term rehab (<3 hours/day)  ?  ?Assistance Recommended at Discharge Frequent or constant Supervision/Assistance  ?Patient can return home with the following Two people to help with walking and/or transfers;Two people to help with bathing/dressing/bathroom;Assistance with cooking/housework;Assistance with feeding;Direct supervision/assist for medications management;Direct  supervision/assist for financial management;Assist for transportation;Help with stairs or ramp for entrance ? ?  ?Functional Status Assessment ? Patient has had a recent decline in their functional status and demonstrates the ability to make significant improvements in function in a reasonable and predictable amount of time.  ?Equipment Recommendations ? None recommended by OT;Other (comment) (defer to next venue of care)  ?  ?Recommendations for Other Services   ? ? ?  ?Precautions / Restrictions Precautions ?Precautions: Fall ?Precaution Comments: pt paraplegic ?Restrictions ?Weight Bearing Restrictions: No  ? ?  ? ?Mobility Bed Mobility ?Overal bed mobility: Needs Assistance ?Bed Mobility: Sit to Supine, Supine to Sit ?  ?  ?Supine to sit: Total assist, +2 for physical assistance ?Sit to supine: Total assist, +2 for physical assistance ?  ?General bed mobility comments: use of helicopter method once BLE's were off of bed ?  ? ?Transfers ?  ?  ?  ?  ?  ?  ?  ?  ?  ?General transfer comment: deferred, spouse to bring AFO's and pivot disc ?  ? ?  ?Balance Overall balance assessment: Needs assistance ?Sitting-balance support: Feet supported, Single extremity supported ?Sitting balance-Leahy Scale: Zero ?Sitting balance - Comments: requiring mod A to recorrect to midline with L lateral lean ?Postural control: Posterior lean, Left lateral lean ?  ?  ?  ?  ?  ?  ?  ?  ?  ?  ?  ?  ?  ?  ?   ? ?ADL either performed or assessed with clinical judgement  ? ?ADL Overall ADL's : Needs assistance/impaired ?Eating/Feeding: Moderate assistance;Bed level ?  ?Grooming: Moderate assistance;Sitting;Oral care ?Grooming Details (indicate cue type and reason): brushes teeth sitting supported EOB ?Upper Body Bathing: Maximal assistance;Sitting ?  ?Lower Body Bathing: Bed level;Total assistance ?  ?  Upper Body Dressing : Maximal assistance;Bed level ?  ?Lower Body Dressing: Sitting/lateral leans;Total assistance ?Lower Body Dressing  Details (indicate cue type and reason): to don socks ?Toilet Transfer: Total assistance;+2 for physical assistance ?  ?Toileting- Clothing Manipulation and Hygiene: Total assistance ?  ?  ?  ?Functional mobility during ADLs: Maximal assistance;+2 for physical assistance ?   ? ? ? ?Vision Baseline Vision/History: 1 Wears glasses ?Vision Assessment?: Vision impaired- to be further tested in functional context ?Additional Comments: delayed tracking of objects, gazes R >L  ?   ?Perception   ?  ?Praxis   ?  ? ?Pertinent Vitals/Pain Pain Assessment ?Pain Assessment: Faces ?Pain Score: 2  ?Faces Pain Scale: Hurts a little bit ?Pain Location: R hip ?Pain Descriptors / Indicators: Discomfort ?Pain Intervention(s): Limited activity within patient's tolerance, Monitored during session, Repositioned  ? ? ? ?Hand Dominance Right ?  ?Extremity/Trunk Assessment Upper Extremity Assessment ?Upper Extremity Assessment: Generalized weakness;RUE deficits/detail;LUE deficits/detail ?RUE Deficits / Details: can lift overhead, weak grasp but functional for ADLs, difficulty releasing objects ?LUE Deficits / Details: no AROM, PROM WFL, cannot perform isolated movements ?LUE Coordination: decreased fine motor;decreased gross motor ?  ?Lower Extremity Assessment ?Lower Extremity Assessment: Defer to PT evaluation ?  ?Cervical / Trunk Assessment ?Cervical / Trunk Assessment: Kyphotic (chin forward, cervical flexion resting posture when sitting EOB, pt can extend neck with cues but cannot maintain) ?  ?Communication Communication ?Communication: No difficulties ?  ?Cognition Arousal/Alertness: Awake/alert ?Behavior During Therapy: Flat affect ?Overall Cognitive Status: Impaired/Different from baseline ?Area of Impairment: Orientation, Attention, Following commands, Safety/judgement, Awareness, Problem solving ?  ?  ?  ?  ?  ?  ?  ?  ?Orientation Level: Situation, Time ?Current Attention Level: Sustained ?  ?Following Commands: Follows one step  commands with increased time ?Safety/Judgement: Decreased awareness of deficits (some L inattention) ?Awareness: Emergent ?Problem Solving: Slow processing, Decreased initiation, Difficulty sequencing, Requires verbal cues, Requires tactile cues ?General Comments: answers questions appropriately responses limited ?  ?  ?General Comments  VSS on RA ? ?  ?Exercises   ?  ?Shoulder Instructions    ? ? ?Home Living Family/patient expects to be discharged to:: Private residence ?Living Arrangements: Spouse/significant other ?Available Help at Discharge: Family;Personal care attendant;Available 24 hours/day ?Type of Home: House ?Home Access: Ramped entrance;Other (comment) ?  ?  ?Home Layout: One level ?  ?  ?Bathroom Shower/Tub: Walk-in shower ?  ?Bathroom Toilet: Handicapped height ?Bathroom Accessibility: Yes ?  ?Home Equipment: Wheelchair - power;Shower seat;BSC/3in1 ?  ?Additional Comments: Spouse providing most PLOF/home setup questions. Platform lift to get in/out of the house. Car has a turn seat in it. pivot disc transfer for inside the house. ?  ? ?  ?Prior Functioning/Environment Prior Level of Function : Needs assist ?  ?  ?  ?  ?  ?  ?Mobility Comments: pt is non-ambulatory and spouse completes std pvt transfer using pivot disc and pt has to be in her bilat AFOs, pt dependent on power w/c but could drive it on her own ?ADLs Comments: pt able to do upper body dressing but dependent for lower body dressing, assist for transfers, pt able to feed self, spouse did the IADLs ?  ? ?  ?  ?OT Problem List:   ?  ?   ?OT Treatment/Interventions: Therapeutic exercise;Self-care/ADL training;Neuromuscular education;Energy conservation;Therapeutic activities;Cognitive remediation/compensation;Visual/perceptual remediation/compensation;Patient/family education;Balance training;DME and/or AE instruction  ?  ?OT Goals(Current goals can be found in the care plan section) Acute Rehab  OT Goals ?Patient Stated Goal: none stated ?OT  Goal Formulation: With patient ?Time For Goal Achievement: 01/06/22 ?Potential to Achieve Goals: Fair  ?OT Frequency: Min 2X/week ?  ? ?Co-evaluation PT/OT/SLP Co-Evaluation/Treatment: Yes ?Reason for Co-Treatment: Comple

## 2021-12-23 NOTE — Hospital Course (Signed)
Stacy Diaz is a 71 y.o. F with premature CVD, prior Stroke, meningioma s/p resection in 1999 with spastic paraplegia, wheelchair bound, CHF EF 40-45%, chronic pain, seizures, breast CA s/p radx, lumpectomy and HTN who presented with aphasia. ? ?Woke morning of admission unable to speak, ?L arm weakness.   ? ?5/8: Arrived to ER as CODE STROKE, Neurology suspected acute metabolic encephalopathy due to infection ?

## 2021-12-23 NOTE — Assessment & Plan Note (Addendum)
BP transiently elevated on admission, hypertensive urgency ruled out. ?- Continue lisinopril, metoprolol ?

## 2021-12-23 NOTE — Assessment & Plan Note (Signed)
EEG normal, Neurology have signed off. ?- Continue Trileptal, diazepam ?

## 2021-12-23 NOTE — Assessment & Plan Note (Signed)
Likely septic injury or fatty liver. ?- Trend ?

## 2021-12-23 NOTE — Progress Notes (Signed)
?  Progress Note ? ? ?Patient: CARLETHA DAWN NLZ:[id number] DOB: 1951-06-12 DOA: 12/22/2021     1 ?DOS: the patient was seen and examined on 12/23/2021 at 9:27AM and 3:40PM ?  ? ? ? ?Brief hospital course: ?Mrs. Lwin is a 71 y.o. F with premature CVD, prior Stroke, meningioma s/p resection in 1999 with spastic paraplegia, wheelchair bound, CHF EF 40-45%, chronic pain, seizures, breast CA s/p radx, lumpectomy and HTN who presented with aphasia. ? ?Woke morning of admission unable to speak, ?L arm weakness.   ? ?5/8: Arrived to ER as CODE STROKE, Neurology suspected acute metabolic encephalopathy due to infection ? ? ? ? ?Assessment and Plan: ?* Severe sepsis probably due to UTI ?Presented with tachycardia, tachypnea, leukocytosis and decreased mentation, lactate 2.4. Suspected source UTI. ?- Continue Rocephin ?- Follow culture data ? ? ? ?Acute metabolic encephalopathy ?CT head showed old encephalomalacia superior cerebrum, chronic ischemic microangiopathy, old craniectomy.  Not a TNK candidate due to time criteria. Not a thrombectomy candidate due to mRS of 4.   ?EEG without new finding ? ?Neurology evaluated patient and felt likely not stroke initially.  Suspected generalized encephalopathy from infection in setting of poor cognitive reserve.  Improved slightly but still with pronounced deficits in attention, processing, memory and following commands. ?- Continue Trileptal ?- Follow Trileptal level (results in 2-4 days) ?- F/u with established outpatient neurologist ? ?Hyperkalemia ?K 5.5 on admission, resolved with fluids ? ?Transaminitis ?Likely septic injury or fatty liver. ?- Trend ? ?Hypocalcemia ?- Repeat ionized CA ? ?Chronic systolic CHF (congestive heart failure) ([place]) ?Appears euvolemic.  Not on Lasix at home.   ?- Continue BB, lisinopril ? ? ?Polycythemia ?Resolved with fluids ? ?Chronic pain ?- Continue home oxycodone, baclofen ? ?CAD (coronary artery disease) ?Cerebrovascular disease ?- Continue  atorvastatin, Plavix, lisinopril, metoprolol ? ?Hyponatremia ?Stable, chronic. ? ?Paraplegia ([place]) ?S/p meningioma resection in 1999 ? ?History of resection of meningioma ?  ? ?Seizure disorder ([place]) ?EEG normal, Neurology have signed off. ?- Continue Trileptal, diazepam ? ?Essential hypertension ?BP transiently elevated on admission, hypertensive urgency ruled out. ?- Continue lisinopril, metoprolol ? ?Hyperlipidemia ?- Continue statin  ?  ? ? ? ? ? ? ? ? ? ?Subjective: No concerning symptoms ? ? ? ? ?Physical Exam: ?Vitals:  ? 12/23/21 0500 12/23/21 0536 12/23/21 0735 12/23/21 1213  ?BP:  (!) 159/98 (!) 110/58 113/65  ?Pulse:  (!) 108 63 (!) 56  ?Resp:  '20 17 20  '$ ?Temp:  98.7 ?F (37.1 ?C) 98.1 ?F (36.7 ?C) 99.2 ?F (37.3 ?C)  ?TempSrc:  Oral Oral Oral  ?SpO2:  94% 98% 99%  ?Weight: 74.6 kg     ?Height:      ? ?Elderly adult female, lying in bed, old craniotomy scar ?Tachycardic, regular, no murmurs, no peripheral edema ?Respiratory effort normal, lungs clear without rales is ?Answers questions, but answers are slowed, has some left-sided weakness, attention diminished. ? ?Data Reviewed: ?Discussed with neurology, nursing notes reviewed, vital signs reviewed ?Basic metabolic panel, CK, TSH, lactate normal, LFTs normal ? ?Family Communication: Husband ? ? ? ?Disposition: ?Status is: Inpatient ?Remains inpatient appropriate because: patient has likely sepsis with encephalopathy, less likely stroke, and still has significant physical deficits. ? ?Will need rehabilitation to return to her semi-independent prior level of function.  Patient is not custodial at baseline ? ? ? ? ? ? ? ?Author: ?Edwin Dada, MD ?12/23/2021 4:23 PM ? ?For on call review [email or web address].  ? ? ?

## 2021-12-23 NOTE — Assessment & Plan Note (Signed)
-   Continue home oxycodone, baclofen ?

## 2021-12-23 NOTE — Assessment & Plan Note (Signed)
-   Repeat ionized CA ?

## 2021-12-23 NOTE — Assessment & Plan Note (Addendum)
CT head showed old encephalomalacia superior cerebrum, chronic ischemic microangiopathy, old craniectomy.  Not a TNK candidate due to time criteria. Not a thrombectomy candidate due to mRS of 4. ? ?EEG without new finding ? ?Neurology evaluated patient and felt likely not stroke initially.  Suspected generalized encephalopathy from infection in setting of poor cognitive reserve.  Improved slightly but still with pronounced deficits in attention, processing, memory and following commands. ?- Continue Trileptal ?- Follow Trileptal level (results in 2-4 days) ?- F/u with established outpatient neurologist ?

## 2021-12-23 NOTE — Assessment & Plan Note (Addendum)
S/p meningioma resection in 1999 ?

## 2021-12-23 NOTE — Assessment & Plan Note (Addendum)
K 5.5 on admission, resolved with fluids ?

## 2021-12-23 NOTE — NC FL2 (Signed)
?[place] MEDICAID FL2 LEVEL OF CARE SCREENING TOOL  ?  ? ?IDENTIFICATION  ?Patient Name: ?Stacy Diaz Birthdate: 01-31-1951 Sex: female Admission Date (Current Location): ?12/22/2021  ?[place] and [place] Number: ? [place] ?  Facility and Address:  ?[facility], [place] [address] [place],  [address] ?     Provider Number: ?[id number]  ?Attending Physician Name and Address:  ?Edwin Dada, * ? Relative Name and Phone Number:  ?  ?   ?Current Level of Care: ?Hospital Recommended Level of Care: ?[place] Prior Approval Number: ?  ? ?Date Approved/Denied: ?  PASRR Number: ?[id number] A ? ?Discharge Plan: ?SNF ?  ? ?Current Diagnoses: ?Patient Active Problem List  ? Diagnosis Date Noted  ? Hypocalcemia 12/23/2021  ? Transaminitis 12/23/2021  ? Acute metabolic encephalopathy 97/41/6384  ? Severe sepsis ([place]) 12/22/2021  ? CAD (coronary artery disease) 12/22/2021  ? Chronic pain 12/22/2021  ? Polycythemia 12/22/2021  ? Hyperkalemia 12/22/2021  ? Chronic systolic CHF (congestive heart failure) ([place]) 12/22/2021  ? Hyponatremia 09/24/2021  ? Abnormal nuclear stress test   ? TIA (transient ischemic attack)   ? Stroke ([place]) 04/25/2021  ? Osteoporosis 09/02/2018  ? Paraplegia ([place]) 07/26/2018  ? Urinary tract infection 04/28/2018  ? Closed fracture of right tibia and fibula 04/26/2018  ? Acute right-sided weakness 06/02/2016  ? History of resection of meningioma 06/02/2016  ? Family history of breast cancer 06/07/2014  ? Breast cancer of lower-outer quadrant of left female breast ([place]) 05/25/2014  ? Partial symptomatic epilepsy with complex partial seizures, not intractable, without status epilepticus ([place]) 04/29/2014  ? History of stroke 04/29/2014  ? Seizure disorder ([place]) 09/04/2012  ? Recent UTI (urinary tract infection) 08/26/2012  ? Paralysis of upper limb ([place]) 08/25/2012  ? BENIGN NEOPLASM OF BRAIN 03/31/2010  ? INJURY, NERVE, PELVIS/LWR LIMB NOS 05/20/2007  ?  Hyperlipidemia 05/12/2007  ? Essential hypertension 05/12/2007  ? PREMATURE VENTRICULAR CONTRACTIONS 05/12/2007  ? ? ?Orientation RESPIRATION BLADDER Height & Weight   ?  ?Self, Place ? Normal Incontinent Weight: 164 lb 7.4 oz (74.6 kg) ?Height:  '5\' 5"'$  (165.1 cm)  ?BEHAVIORAL SYMPTOMS/MOOD NEUROLOGICAL BOWEL NUTRITION STATUS  ?  Convulsions/Seizures Continent Diet (heart healthy)  ?AMBULATORY STATUS COMMUNICATION OF NEEDS Skin   ?Extensive Assist Verbally Normal ?  ?  ?  ?    ?     ?     ? ? ?Personal Care Assistance Level of Assistance  ?Bathing, Feeding, Dressing Bathing Assistance: Maximum assistance ?Feeding assistance: Maximum assistance ?Dressing Assistance: Maximum assistance ?   ? ?Functional Limitations Info  ?Sight, Speech Sight Info: Impaired (blurred vision) ?  ?Speech Info: Impaired (delayed responses)  ? ? ?SPECIAL CARE FACTORS FREQUENCY  ?PT (By licensed PT), OT (By licensed OT)   ?  ?PT Frequency: 5x/wk ?OT Frequency: 5x/wk ?  ?  ?  ?   ? ? ?Contractures Contractures Info: Not present  ? ? ?Additional Factors Info  ?Code Status, Allergies Code Status Info: Full ?Allergies Info: NKA ?  ?  ?  ?   ? ?Current Medications (12/23/2021):  This is the current hospital active medication list ?Current Facility-Administered Medications  ?Medication Dose Route Frequency Provider Last Rate Last Admin  ? acetaminophen (TYLENOL) tablet 650 mg  650 mg Oral Q6H PRN Fuller Plan A, MD   650 mg at 12/23/21 1035  ? Or  ? acetaminophen (TYLENOL) suppository 650 mg  650 mg Rectal Q6H PRN Norval Morton, MD      ?  albuterol (PROVENTIL) (2.5 MG/3ML) 0.083% nebulizer solution 2.5 mg  2.5 mg Nebulization Q6H PRN Fuller Plan A, MD      ? atorvastatin (LIPITOR) tablet 10 mg  10 mg Oral q1800 Fuller Plan A, MD   10 mg at 12/22/21 1700  ? baclofen (LIORESAL) tablet 20 mg  20 mg Oral QID Fuller Plan A, MD   20 mg at 12/23/21 1243  ? cefTRIAXone (ROCEPHIN) 1 g in sodium chloride 0.9 % 100 mL IVPB  1 g Intravenous Q24H  Tamala Julian, Rondell A, MD 200 mL/hr at 12/23/21 0656 1 g at 12/23/21 0656  ? clopidogrel (PLAVIX) tablet 75 mg  75 mg Oral Daily Fuller Plan A, MD   75 mg at 12/23/21 1028  ? diazepam (VALIUM) tablet 5 mg  5 mg Oral Daily Tamala Julian, Rondell A, MD   5 mg at 12/23/21 1030  ? enoxaparin (LOVENOX) injection 40 mg  40 mg Subcutaneous Q24H Smith, Rondell A, MD   40 mg at 12/23/21 1030  ? hydrALAZINE (APRESOLINE) injection 10 mg  10 mg Intravenous Q4H PRN Fuller Plan A, MD      ? lisinopril (ZESTRIL) tablet 40 mg  40 mg Oral Daily Tamala Julian, Rondell A, MD   40 mg at 12/23/21 1028  ? liver oil-zinc oxide (DESITIN) 40 % ointment 1 application.  1 application. Topical QHS Norval Morton, MD   1 application. at 12/22/21 2147  ? melatonin tablet 5 mg  5 mg Oral QHS Fuller Plan A, MD   5 mg at 12/22/21 2147  ? metoprolol tartrate (LOPRESSOR) tablet 100 mg  100 mg Oral BID Fuller Plan A, MD   100 mg at 12/23/21 1029  ? Muscle Rub CREA   Topical PRN Norval Morton, MD   Given at 12/22/21 1537  ? naloxone [facility]) injection 0.4 mg  0.4 mg Intravenous PRN Fuller Plan A, MD      ? ondansetron (ZOFRAN) tablet 4 mg  4 mg Oral Q6H PRN Fuller Plan A, MD      ? Or  ? ondansetron (ZOFRAN) injection 4 mg  4 mg Intravenous Q6H PRN Fuller Plan A, MD      ? Oxcarbazepine (TRILEPTAL) tablet 300 mg  300 mg Oral Daily Kaleen Mask, RPH   300 mg at 12/23/21 1029  ? And  ? Oxcarbazepine (TRILEPTAL) tablet 600 mg  600 mg Oral QHS Kaleen Mask, RPH   600 mg at 12/22/21 2146  ? oxyCODONE (Oxy IR/ROXICODONE) immediate release tablet 10 mg  10 mg Oral Q8H PRN Fuller Plan A, MD   10 mg at 12/23/21 0206  ? polyethylene glycol (MIRALAX / GLYCOLAX) packet 17 g  17 g Oral Weekly Smith, Rondell A, MD      ? polyvinyl alcohol (LIQUIFILM TEARS) 1.4 % ophthalmic solution 1 drop  1 drop Both Eyes Daily Joselyn Glassman A, RPH   1 drop at 12/23/21 1030  ? ? ? ?Discharge Medications: ?Please see discharge summary for a list of discharge  medications. ? ?Relevant Imaging Results: ? ?Relevant Lab Results: ? ? ?Additional Information ?SS#: [id number] ? ?Geralynn Ochs, LCSW ? ? ? ? ?

## 2021-12-23 NOTE — Evaluation (Signed)
Physical Therapy Evaluation ?Patient Details ?Name: Stacy Diaz ?MRN: [id number] ?DOB: [birth year] ?Today's Date: 12/23/2021 ? ?History of Present Illness ? Stacy Diaz is a 71 y.o. female who present to ED with confusion, inability to speak and L UE weakness. CT negative for stroke. Pt found to have UIT.  PMH: hypertension, hyperlipidemia, CVA, CAD, HFrEF,  meningioma  in 1998 s/p resection with residual paraplegia, breast cancer, seizure disorder, chronic pain, and hyponatremia ?  ?Clinical Impression ? Pt presenting with above. Pt was able to complete std pvt transfer with assist of spouse and pvt disc, complete upper body dressing, maintain balance at edge of bed without assist, feed self, complete ADLs from sitting , and operate power w/c. Pt now requiring totalAX2 to transfer to EOB, dependent on posterior external support from therapist to maintain EOB balance, and is unable to use L UE to assist with transfers for ADLs. Pt to greatly benefit from ST-SNF upon d/c to return to PLOF for safe transition home with spouse. Acute PT to cont to follow.  ? ?Recommendations for follow up therapy are one component of a multi-disciplinary discharge planning process, led by the attending physician.  Recommendations may be updated based on patient status, additional functional criteria and insurance authorization. ? ?Follow Up Recommendations Skilled nursing-short term rehab (<3 hours/day) ? ?  ?Assistance Recommended at Discharge Frequent or constant Supervision/Assistance  ?Patient can return home with the following ? A lot of help with walking and/or transfers;A lot of help with bathing/dressing/bathroom;Assistance with cooking/housework;Direct supervision/assist for medications management;Direct supervision/assist for financial management;Assist for transportation;Help with stairs or ramp for entrance ? ?  ?Equipment Recommendations None recommended by PT  ?Recommendations for Other Services ?    ?  ?Functional  Status Assessment Patient has had a recent decline in their functional status and demonstrates the ability to make significant improvements in function in a reasonable and predictable amount of time.  ? ?  ?Precautions / Restrictions Precautions ?Precautions: Fall ?Precaution Comments: pt paraplegic ?Restrictions ?Weight Bearing Restrictions: No  ? ?  ? ?Mobility ? Bed Mobility ?Overal bed mobility: Needs Assistance ?Bed Mobility: Sit to Supine, Supine to Sit ?  ?  ?Supine to sit: Total assist, +2 for physical assistance ?Sit to supine: Total assist, +2 for physical assistance ?  ?General bed mobility comments: with verbal cues pt able to attempt to use R UE on hand rail but ultimately required totalAX2 to transfer to EOB and then return to supine ?  ? ?Transfers ?  ?  ?  ?  ?  ?  ?  ?  ?  ?General transfer comment: spouse states "she can't stand without her braces" spouse to bring braces in to hospital to trial with therapy ?  ? ?Ambulation/Gait ?  ?  ?  ?  ?  ?  ?  ?  ? ?Stairs ?  ?  ?  ?  ?  ? ?Wheelchair Mobility ?  ? ?Modified Rankin (Stroke Patients Only) ?Modified Rankin (Stroke Patients Only) ?Pre-Morbid Rankin Score: Severe disability ?Modified Rankin: Severe disability ? ?  ? ?Balance Overall balance assessment: Needs assistance ?Sitting-balance support: Feet supported, Single extremity supported ?Sitting balance-Leahy Scale: Zero ?Sitting balance - Comments: pt dependent on external support, pt unable to use L UE functionally to hold self up and R UE support wasn't enough to prevent left, posterior lean ?Postural control: Posterior lean, Left lateral lean ?  ?  ?  ?  ?  ?  ?  ?  ?  ?  ?  ?  ?  ?  ?   ? ? ? ?  Pertinent Vitals/Pain Pain Assessment ?Pain Assessment: Faces ?Faces Pain Scale: Hurts a little bit ?Pain Location: pt yelling to spouse my hips hurts, the right one, s/p moving pt to EOB and returning to supine pt reports pain to have dissipated  ? ? ?Home Living Family/patient expects to be  discharged to:: Private residence ?Living Arrangements: Spouse/significant other ?Available Help at Discharge: Family;Personal care attendant;Available 24 hours/day ([facility] 2hrs M-saturday) ?Type of Home: House ?Home Access: Ramped entrance;Other (comment) ?  ?  ?  ?Home Layout: One level ?Home Equipment: Wheelchair - power;Shower seat;BSC/3in1 ?Additional Comments: Platform lift to get in/out of the house. Car has a turn seat in it. Disc transfer for inside the house.  ?  ?Prior Function Prior Level of Function : Needs assist ?  ?  ?  ?  ?  ?  ?Mobility Comments: pt is non-ambulatory and spouse completes std pvt transfer using pivot disc and pt has to be in her bilat AFOs, pt dependent on power w/c but could drive it on her own ?ADLs Comments: pt able to do upper body dressing but dependent for lower body dressing, assist for transfers, pt able to feed self, spouse did the cooking ?  ? ? ?Hand Dominance  ? Dominant Hand: Right ? ?  ?Extremity/Trunk Assessment  ? Upper Extremity Assessment ?Upper Extremity Assessment: Defer to OT evaluation ?  ? ?Lower Extremity Assessment ?Lower Extremity Assessment:  (pt paraplegic, flaccid bilat LEs) ?  ? ?Cervical / Trunk Assessment ?Cervical / Trunk Assessment: Kyphotic (unable to maintain cervical extension)  ?Communication  ? Communication: No difficulties  ?Cognition Arousal/Alertness: Awake/alert ?Behavior During Therapy: Flat affect ?Overall Cognitive Status: Impaired/Different from baseline ?Area of Impairment: Orientation, Attention, Following commands, Safety/judgement, Awareness, Problem solving ?  ?  ?  ?  ?  ?  ?  ?  ?Orientation Level: Disoriented to, Time, Situation (pt stated november and 2003, pt picked november when given a choice between may and november, unable to figure out date when PT stated 2003 was 20 years ago. pt did state [facility]) ?Current Attention Level: Sustained ?  ?Following Commands: Follows one step commands with increased  time ?Safety/Judgement: Decreased awareness of deficits (Some L inattention) ?Awareness: Emergent (pt stating she needs to be changed but isn't wet) ?Problem Solving: Slow processing, Decreased initiation, Difficulty sequencing, Requires verbal cues, Requires tactile cues ?General Comments: pt verbal and answers questions but has difficulty processing especially with L UE ?  ?  ? ?  ?General Comments General comments (skin integrity, edema, etc.): VSS, pt with no skin breakdown ? ?  ?Exercises    ? ?Assessment/Plan  ?  ?PT Assessment Patient needs continued PT services  ?PT Problem List Decreased strength;Decreased activity tolerance;Decreased balance;Decreased mobility;Decreased coordination;Decreased cognition ? ?   ?  ?PT Treatment Interventions DME instruction;Gait training;Stair training;Functional mobility training;Therapeutic activities;Therapeutic exercise;Balance training;Cognitive remediation;Patient/family education   ? ?PT Goals (Current goals can be found in the Care Plan section)  ?Acute Rehab PT Goals ?PT Goal Formulation: With patient/family ?Time For Goal Achievement: 01/06/22 ?Potential to Achieve Goals: Fair ? ?  ?Frequency Min 3X/week ?  ? ? ?Co-evaluation PT/OT/SLP Co-Evaluation/Treatment: Yes ?Reason for Co-Treatment: Complexity of the patient's impairments (multi-system involvement) ?PT goals addressed during session: Mobility/safety with mobility ?  ?  ? ? ?  ?AM-PAC PT "6 Clicks" Mobility  ?Outcome Measure Help needed turning from your back to your side while in a flat bed without using bedrails?: Total ?Help needed moving from lying on your  back to sitting on the side of a flat bed without using bedrails?: Total ?Help needed moving to and from a bed to a chair (including a wheelchair)?: Total ?Help needed standing up from a chair using your arms (e.g., wheelchair or bedside chair)?: Total ?Help needed to walk in hospital room?: Total ?Help needed climbing 3-5 steps with a railing? :  Total ?6 Click Score: 6 ? ?  ?End of Session   ?Activity Tolerance: Patient tolerated treatment well ?Patient left: in bed;with bed alarm set;with family/visitor present;with call bell/phone within reach ?Nurse Communication: Neta Mends

## 2021-12-23 NOTE — Plan of Care (Signed)
?  Problem: Urinary Elimination: ?Goal: Signs and symptoms of infection will decrease ?Outcome: Progressing ?  ?Problem: Activity: ?Goal: Risk for activity intolerance will decrease ?Outcome: Progressing ?  ?Problem: Nutrition: ?Goal: Adequate nutrition will be maintained ?Outcome: Progressing ?  ?Problem: Pain Managment: ?Goal: General experience of comfort will improve ?Outcome: Progressing ?  ?Problem: Skin Integrity: ?Goal: Risk for impaired skin integrity will decrease ?Outcome: Progressing ?  ?Problem: Safety: ?Goal: Ability to remain free from injury will improve ?Outcome: Progressing ?  ?

## 2021-12-23 NOTE — Assessment & Plan Note (Addendum)
Presented with tachycardia, tachypnea, leukocytosis and decreased mentation, lactate 2.4. Suspected source UTI. ?- Continue Rocephin ?- Follow culture data ?

## 2021-12-23 NOTE — Assessment & Plan Note (Addendum)
Cerebrovascular disease ?- Continue atorvastatin, Plavix, lisinopril, metoprolol ?

## 2021-12-23 NOTE — Assessment & Plan Note (Signed)
Appears euvolemic.  Not on Lasix at home.   ?- Continue BB, lisinopril ? ?

## 2021-12-23 NOTE — Assessment & Plan Note (Signed)
BP 192/95 on admission. ?

## 2021-12-24 ENCOUNTER — Inpatient Hospital Stay (HOSPITAL_COMMUNITY): Payer: PPO

## 2021-12-24 DIAGNOSIS — G40919 Epilepsy, unspecified, intractable, without status epilepticus: Secondary | ICD-10-CM | POA: Diagnosis not present

## 2021-12-24 DIAGNOSIS — R6520 Severe sepsis without septic shock: Secondary | ICD-10-CM | POA: Diagnosis not present

## 2021-12-24 DIAGNOSIS — A419 Sepsis, unspecified organism: Secondary | ICD-10-CM | POA: Diagnosis not present

## 2021-12-24 DIAGNOSIS — R4701 Aphasia: Secondary | ICD-10-CM | POA: Diagnosis not present

## 2021-12-24 DIAGNOSIS — R531 Weakness: Secondary | ICD-10-CM

## 2021-12-24 LAB — COMPREHENSIVE METABOLIC PANEL
ALT: 26 U/L (ref 0–44)
AST: 35 U/L (ref 15–41)
Albumin: 3.7 g/dL (ref 3.5–5.0)
Alkaline Phosphatase: 43 U/L (ref 38–126)
Anion gap: 11 (ref 5–15)
BUN: 13 mg/dL (ref 8–23)
CO2: 19 mmol/L — ABNORMAL LOW (ref 22–32)
Calcium: 8.7 mg/dL — ABNORMAL LOW (ref 8.9–10.3)
Chloride: 96 mmol/L — ABNORMAL LOW (ref 98–111)
Creatinine, Ser: 0.62 mg/dL (ref 0.44–1.00)
GFR, Estimated: >60 mL/min (ref >60)
Glucose, Bld: 123 mg/dL — ABNORMAL HIGH (ref 70–99)
Potassium: 4.2 mmol/L (ref 3.5–5.1)
Sodium: 126 mmol/L — ABNORMAL LOW (ref 135–145)
Total Bilirubin: 0.8 mg/dL (ref 0.3–1.2)
Total Protein: 6.2 g/dL — ABNORMAL LOW (ref 6.5–8.1)

## 2021-12-24 LAB — GLUCOSE, CAPILLARY: Glucose-Capillary: 140 mg/dL — ABNORMAL HIGH (ref 70–99)

## 2021-12-24 LAB — URINE CULTURE: Culture: >=100000 — AB

## 2021-12-24 LAB — CBC
HCT: 39.5 % (ref 36.0–46.0)
Hemoglobin: 13.9 g/dL (ref 12.0–15.0)
MCH: 31.1 pg (ref 26.0–34.0)
MCHC: 35.2 g/dL (ref 30.0–36.0)
MCV: 88.4 fL (ref 80.0–100.0)
Platelets: 227 10*3/uL (ref 150–400)
RBC: 4.47 MIL/uL (ref 3.87–5.11)
RDW: 12.8 % (ref 11.5–15.5)
WBC: 10 10*3/uL (ref 4.0–10.5)
nRBC: 0 % (ref 0.0–0.2)

## 2021-12-24 LAB — CALCIUM, IONIZED: Calcium, Ionized, Serum: 4.1 mg/dL — ABNORMAL LOW (ref 4.5–5.6)

## 2021-12-24 MED ORDER — LACOSAMIDE 50 MG PO TABS
50.0000 mg | ORAL_TABLET | Freq: Two times a day (BID) | ORAL | Status: DC
  Administered 2021-12-25: 50 mg via ORAL
  Filled 2021-12-24: qty 1

## 2021-12-24 MED ORDER — OXCARBAZEPINE 150 MG PO TABS
150.0000 mg | ORAL_TABLET | Freq: Every day | ORAL | Status: DC
Start: 2021-12-28 — End: 2021-12-27

## 2021-12-24 MED ORDER — LACOSAMIDE 50 MG PO TABS: 100.0000 mg | ORAL_TABLET | Freq: Two times a day (BID) | ORAL | Status: DC

## 2021-12-24 MED ORDER — OXCARBAZEPINE 150 MG PO TABS
150.0000 mg | ORAL_TABLET | Freq: Two times a day (BID) | ORAL | Status: DC
  Administered 2021-12-26: 150 mg via ORAL
  Filled 2021-12-24 (×2): qty 1

## 2021-12-24 MED ORDER — SODIUM CHLORIDE 0.9 % IV SOLN
200.0000 mg | Freq: Once | INTRAVENOUS | Status: AC
  Administered 2021-12-24: 200 mg via INTRAVENOUS
  Filled 2021-12-24: qty 20

## 2021-12-24 MED ORDER — IOHEXOL 350 MG/ML SOLN
75.0000 mL | Freq: Once | INTRAVENOUS | Status: AC | PRN
  Administered 2021-12-24: 75 mL via INTRAVENOUS

## 2021-12-24 MED ORDER — CEPHALEXIN 500 MG PO CAPS
500.0000 mg | ORAL_CAPSULE | Freq: Two times a day (BID) | ORAL | Status: AC
  Administered 2021-12-24 – 2021-12-27 (×8): 500 mg via ORAL
  Filled 2021-12-24 (×8): qty 1

## 2021-12-24 MED ORDER — OXCARBAZEPINE 300 MG PO TABS
300.0000 mg | ORAL_TABLET | Freq: Two times a day (BID) | ORAL | Status: AC
  Administered 2021-12-24 – 2021-12-26 (×4): 300 mg via ORAL
  Filled 2021-12-24 (×4): qty 1

## 2021-12-24 NOTE — Progress Notes (Signed)
? Stacy Diaz  ELF:[id number] DOB: 09-07-1950 DOA: 12/22/2021 ?PCP: Isaac Bliss, Rayford Halsted, MD   ? ?Brief Narrative:  ?71 year old with a history of premature CVD and prior stroke, meningioma status post resection 1999 with spastic paraplegia requiring wheelchair, systolic CHF with a EF 58-52%, chronic pain, seizure disorder, breast cancer status postlumpectomy and radiation, and HTN who awoke the morning of her admission unable to speak with possible left arm weakness.  Code stroke was initiated upon ER arrival but upon evaluation neurology felt her symptoms most likely represented acute metabolic encephalopathy due to UTI. ? ?Consultants:  ?Neurology ? ?Code Status: FULL CODE ? ?DVT prophylaxis: ?Lovenox ? ?Interim Hx: ?Patient was seen twice today. ? ?At the time of my first visit the patient was alert jovial and in good spirits.  She denies specific complaints.  She stated she had a good appetite and was eating well.  She exhibited essentially full range of motion of bilateral upper extremities with no clear cranial nerve deficits. ? ?I was called back to the room a second time because of an acute change in the patient's neurologic exam around 4 PM.  She suddenly had a strong right gaze preference and appeared to be unable to turn her head to the left.  She also appeared to be unable to lift her left arm off the bed.  She complained of a headache and decreased visual acuity.  After confirming these deficits at the bedside a code stroke was initiated and neurology promptly arrived at the bedside. ? ?Assessment & Plan: ? ?Severe sepsis due to E. coli and Proteus UTI ?Presented with tachycardia, tachypnea, leukocytosis and decreased mentation, with lactate 2.4 -treated initially with Rocephin to which both organisms have proven to be sensitive -transition to Keflex today ? ?Acute metabolic encephalopathy ?CT head showed old encephalomalacia superior cerebrum, chronic ischemic microangiopathy, old craniectomy  - EEG without acute findings - Neurology felt likely not stroke but rather generalized encephalopathy from infection in setting of poor cognitive reserve - still with pronounced deficits in attention, processing, memory and following commands -with recrudescence of symptoms today stroke is being ruled out again, but presently seizure complaints highest on our differential -seizure medicines to be adjusted by neurology ? ?Hyponatremia ?Etiology unclear -monitor trend ? ?Hyperkalemia ?K 5.5 on admission, resolved with fluids ? ?Transaminitis ?Likely septic injury or fatty liver -now resolved with LFTs normal ? ?Hypocalcemia ?ionized CA is mildly low at 4.1 -initiate calcium replacement ? ?Chronic systolic CHF  ?Appears euvolemic on exam- not on Lasix at home - continue BB, lisinopril ? ?Polycythemia ?Resolved with fluids -likely simple dehydration ? ?Chronic pain ?Continue home oxycodone, baclofen ? ?CAD / Cerebrovascular disease ?Continue atorvastatin, Plavix, lisinopril, metoprolol ? ?Chronic hyponatremia ?Stable with sodium range 124 -133 ? ?Chronic paraplegia  ?S/p meningioma resection in 1999 ? ?Seizure disorder ?EEG normal - continue Trileptal, diazepam ? ?Essential hypertension ?BP currently well controlled - continue lisinopril, metoprolol ? ?Hyperlipidemia ?Continue statin  ? ?Family Communication: Spoke with husband at bedside ?Disposition: SNF rehab stay recommended by therapy ? ? ?Objective: ?Blood pressure 106/63, pulse (!) 50, temperature 97.6 ?F (36.4 ?C), temperature source Oral, resp. rate 16, height '5\' 5"'$  (1.651 m), weight 76.7 kg, SpO2 97 %. ? ?Intake/Output Summary (Last 24 hours) at 12/24/2021 0932 ?Last data filed at 12/24/2021 7782 ?Gross per 24 hour  ?Intake 100 ml  ?Output 200 ml  ?Net -100 ml  ? ?Filed Weights  ? 12/22/21 0220 12/23/21 0500 12/24/21 0500  ?Weight: 79.4  kg 74.6 kg 76.7 kg  ? ? ?Examination: ?General: No acute respiratory distress ?Lungs: Clear to auscultation bilaterally without  wheezes or crackles ?Cardiovascular: Regular rate and rhythm without murmur gallop or rub normal S1 and S2 ?Abdomen: Nontender, nondistended, soft, bowel sounds positive, no rebound, no ascites, no appreciable mass ?Extremities: No significant cyanosis, clubbing, or edema bilateral lower extremitie ?Neuro: ?At baseline patient is unable to move her legs against gravity -she has mildly discoordinated movement of the left arm with 4/5 strength and discoordinated minimal fine motor movement of the left hand but is normally able to raise the arm such as to place it around someone shoulders to hug them -she does not have chronic cranial nerve deficits ? ?CBC: ?Recent Labs  ?Lab 12/22/21 ?0200 12/22/21 ?0205 12/23/21 ?0158 12/24/21 ?0306  ?WBC 12.4*  --  14.8* 10.0  ?NEUTROABS 6.0  --   --   --   ?HGB 16.4* 16.3* 14.4 13.9  ?HCT 46.5* 48.0* 41.3 39.5  ?MCV 90.3  --  88.1 88.4  ?PLT 294  --  280 227  ? ?Basic Metabolic Panel: ?Recent Labs  ?Lab 12/22/21 ?0808 12/23/21 ?0158 12/24/21 ?0306  ?NA 133* 126* 126*  ?K 3.8 3.6 4.2  ?CL 99 96* 96*  ?CO2 20* 19* 19*  ?GLUCOSE 144* 130* 123*  ?BUN '9 11 13  '$ ?CREATININE 0.63 0.69 0.62  ?CALCIUM 9.4 9.1 8.7*  ?MG 1.8  --   --   ? ?GFR: ?Estimated Creatinine Clearance: 67 mL/min (by C-G formula based on SCr of 0.62 mg/dL). ? ?Liver Function Tests: ?Recent Labs  ?Lab 12/22/21 ?0200 12/23/21 ?0158 12/24/21 ?0306  ?AST 45* 35 35  ?ALT '25 24 26  '$ ?ALKPHOS 53 45 43  ?BILITOT 0.8 0.9 0.8  ?PROT 7.0 6.7 6.2*  ?ALBUMIN 4.4 3.9 3.7  ? ? ?Coagulation Profile: ?Recent Labs  ?Lab 12/22/21 ?0200  ?INR 0.9  ? ? ?Cardiac Enzymes: ?Recent Labs  ?Lab 12/22/21 ?0200  ?CKTOTAL 98  ? ? ?HbA1C: ?Hgb A1c MFr Bld  ?Date/Time Value Ref Range Status  ?04/26/2021 02:29 AM 5.2 4.8 - 5.6 % Final  ?  Comment:  ?  (NOTE) ?Pre diabetes:          5.7%-6.4% ? ?Diabetes:              >6.4% ? ?Glycemic control for   <7.0% ?adults with diabetes ?  ?03/08/2020 03:24 PM 5.2 <5.7 % of total Hgb Final  ?  Comment:  ?  For the  purpose of screening for the presence of ?diabetes: ?. ?<5.7%       Consistent with the absence of diabetes ?5.7-6.4%    Consistent with increased risk for diabetes ?            (prediabetes) ?> or =6.5%  Consistent with diabetes ?Marland Kitchen ?This assay result is consistent with a decreased risk ?of diabetes. ?. ?Currently, no consensus exists regarding use of ?hemoglobin A1c for diagnosis of diabetes in children. ?. ?According to American Diabetes Association (ADA) ?guidelines, hemoglobin A1c <7.0% represents optimal ?control in non-pregnant diabetic patients. Different ?metrics may apply to specific patient populations.  ?Standards of Medical Care in Diabetes(ADA). ?. ?  ? ? ?Scheduled Meds: ? atorvastatin  10 mg Oral q1800  ? baclofen  20 mg Oral QID  ? clopidogrel  75 mg Oral Daily  ? diazepam  5 mg Oral Daily  ? enoxaparin (LOVENOX) injection  40 mg Subcutaneous Q24H  ? lisinopril  40 mg Oral Daily  ?  liver oil-zinc oxide  1 application. Topical QHS  ? melatonin  5 mg Oral QHS  ? metoprolol tartrate  100 mg Oral BID  ? OXcarbazepine  300 mg Oral Daily  ? And  ? OXcarbazepine  600 mg Oral QHS  ? polyethylene glycol  17 g Oral Weekly  ? polyvinyl alcohol  1 drop Both Eyes Daily  ? ?Continuous Infusions: ? cefTRIAXone (ROCEPHIN)  IV 1 g (12/24/21 0342)  ? ? ? LOS: 2 days  ? ?Cherene Altes, MD ?[facility] ?Office  [phone] ?Pager - Text Page per Shea Evans ? ?If 7PM-7AM, please contact night-coverage per Amion ?12/24/2021, 9:32 AM ? ? ? ? ?

## 2021-12-24 NOTE — TOC Progression Note (Signed)
Transition of Care (TOC) - Progression Note  ? ? ?Patient Details  ?Name: Stacy Diaz ?MRN: [id number] ?Date of Birth: [birth year] ? ?Transition of Care (TOC) CM/SW Contact  ?[name], RN ?Phone Number: ?12/24/2021, 10:28 AM ? ?Clinical Narrative:    ?Patient's spouse has decided on [facility] care for rehab. CM has updated Juliann Pulse at [facility]. They will have a bed available.  ?CM has initiated insurance auth through [facility], including ambulance transport for a tomorrow admission. ?TOC following. ? ? ?Expected Discharge Plan: [place] ?Barriers to Discharge: Continued Medical Work up ? ?Expected Discharge Plan and Services ?Expected Discharge Plan: [place] ?  ?Discharge Planning Services: CM Consult ?Post Acute Care Choice: [place] ?Living arrangements for the past 2 months: [place] ?                ?  ?  ?  ?  ?  ?  ?  ?  ?  ?  ? ? ?Social Determinants of Health (SDOH) Interventions ?  ? ?Readmission Risk Interventions ?   ? View : No data to display.  ?  ?  ?  ? ? ?

## 2021-12-24 NOTE — Progress Notes (Signed)
Speech Language Pathology Treatment:    ?Patient Details ?Name: Stacy Diaz ?MRN: [id number] ?DOB: 10-06-1950 ?Today's Date: 12/24/2021 ?Time: [phone] ?SLP Time Calculation (min) (ACUTE ONLY): 13 min ? ?Assessment / Plan / Recommendation ?Clinical Impression ? Pt was seen for dysphagia treatment. She was alert and cooperative during the session and her husband was present. Pt's husband denied the pt having any difficulty with p.o. intake since her episode yesterday. Pt was seen with breakfast which included an omelette, sausage, toast, fresh fruit, and thin liquids. Pt tolerated all solids and liquids without overt s/sx of aspiration. Mastication was [facility] and mild lingual residue was cleared with a liquid wash. A pill was given with puree by Ty, RN and pt exhibited a single delayed cough after additional bolus of puree. Pt's current diet will be continued and SLP will follow briefly to ensure continued tolerance.  ?  ?HPI HPI: Pt is a 67 female presenting with inability to speak. Code stroke called initially but CTH negative and presentation more consistent with underlying metabolic process. Not able to have MRI. CXR without focal consolidation. Admitted with sepsis secondary to UTI and acute metabolic encephalopathy. Previous swallow eval in January 2014 recommended regular solids and thin liquids with use of small sips and lingual sweep to clear L pocketing. Evaluated initially in the ED 5/8 recommending regular solids and thin liquids, but pt was noted to be coughing on thin liquids 5/9 so SLP was reordered.  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, HTN, HLD, breast ca, seizure d/o, chronic pain, anxiety ?  ?   ?SLP Plan ? Continue with current plan of care ? ?  ?  ?Recommendations for follow up therapy are one component of a multi-disciplinary discharge planning process, led by the attending physician.  Recommendations may be updated based on patient status, additional  functional criteria and insurance authorization. ?  ? ?Recommendations  ?Diet recommendations: Regular;Thin liquid ?Liquids provided via: Cup;Straw ?Medication Administration: Whole meds with puree ?Supervision: Patient able to self feed ?Compensations: Slow rate;Small sips/bites ?Postural Changes and/or Swallow Maneuvers: Seated upright 90 degrees  ?   ?    ?   ? ? ? ? Oral Care Recommendations: Oral care BID ?Follow Up Recommendations: No SLP follow up ?Assistance recommended at discharge: Frequent or constant Supervision/Assistance ?SLP Visit Diagnosis: Dysphagia, unspecified (R13.10) ?Plan: Continue with current plan of care ? ? ? ? ?  ?  ? ?Stacy Diaz, [place], CCC-SLP ?Acute Rehabilitation Services ?Office number [phone] ?Pager [phone] ? ?Stacy Diaz ? ?12/24/2021, 9:38 AM ? ? ? ?

## 2021-12-24 NOTE — Evaluation (Addendum)
Speech Language Pathology Evaluation ?Patient Details ?Name: Stacy Diaz ?MRN: [id number] ?DOB: [birth year] ?Today's Date: 12/24/2021 ?Time: 9509-3267 ?SLP Time Calculation (min) (ACUTE ONLY): 26 min ? ?Problem List:  ?Patient Active Problem List  ? Diagnosis Date Noted  ? Hypocalcemia 12/23/2021  ? Transaminitis 12/23/2021  ? Acute metabolic encephalopathy 12/45/8099  ? Severe sepsis ([place]) 12/22/2021  ? CAD (coronary artery disease) 12/22/2021  ? Chronic pain 12/22/2021  ? Polycythemia 12/22/2021  ? Hyperkalemia 12/22/2021  ? Chronic systolic CHF (congestive heart failure) ([place]) 12/22/2021  ? Hyponatremia 09/24/2021  ? Abnormal nuclear stress test   ? TIA (transient ischemic attack)   ? Stroke ([place]) 04/25/2021  ? Osteoporosis 09/02/2018  ? Paraplegia ([place]) 07/26/2018  ? Urinary tract infection 04/28/2018  ? Closed fracture of right tibia and fibula 04/26/2018  ? Acute right-sided weakness 06/02/2016  ? History of resection of meningioma 06/02/2016  ? Family history of breast cancer 06/07/2014  ? Breast cancer of lower-outer quadrant of left female breast ([place]) 05/25/2014  ? Partial symptomatic epilepsy with complex partial seizures, not intractable, without status epilepticus ([place]) 04/29/2014  ? History of stroke 04/29/2014  ? Seizure disorder ([place]) 09/04/2012  ? Recent UTI (urinary tract infection) 08/26/2012  ? Paralysis of upper limb ([place]) 08/25/2012  ? BENIGN NEOPLASM OF BRAIN 03/31/2010  ? INJURY, NERVE, PELVIS/LWR LIMB NOS 05/20/2007  ? Hyperlipidemia 05/12/2007  ? Essential hypertension 05/12/2007  ? PREMATURE VENTRICULAR CONTRACTIONS 05/12/2007  ? ?Past Medical History:  ?Past Medical History:  ?Diagnosis Date  ? Anxiety   ? Arthritis   ? "probably" (08/25/2012)  ? Breast cancer of lower-outer quadrant of left female breast ([place]) 05/25/2014  ? Headache(784.0)   ? "not real frequent" (08/25/2012)  ? Heart disease   ? HTN (hypertension)   ? Hyperlipidemia   ? Meningioma ([place]) 1999  ? Myocardial infarction [facility])  1987  ? Paresis of lower extremity ([place]) 1999  ? BLE/notes 08/25/2012  ? S/P radiation therapy 07/30/14-08/30/13  ? left breast cancer/50Gy  ? Seizures ([place])   ? no seizures since 2014  ? Spastic paraparesis   ? S/P meningioma resection 1999/notes 08/25/2012  ? Stroke [facility])   ? x 2, the last 2014  ? ?Past Surgical History:  ?Past Surgical History:  ?Procedure Laterality Date  ? BRAIN MENINGIOMA EXCISION  05/1997  ? BREAST LUMPECTOMY WITH NEEDLE LOCALIZATION AND AXILLARY SENTINEL LYMPH NODE BX Left 06/22/2014  ? Procedure: LEFT WIRE LOCALIZED LUMPECTOPMY AND SENTINEL NODE MAPPING;  Surgeon: Autumn Messing III, MD;  Location: [place];  Service: General;  Laterality: Left;  ? [place]  ? CORONARY ANGIOPLASTY  1987  ? denied intervention: "blockage wasn't bad enough"  ? LEFT HEART CATH AND CORONARY ANGIOGRAPHY N/A 07/08/2021  ? Procedure: LEFT HEART CATH AND CORONARY ANGIOGRAPHY;  Surgeon: Troy Sine, MD;  Location: [place] CV LAB;  Service: Cardiovascular;  Laterality: N/A;  ? TIBIA IM NAIL INSERTION Right 04/26/2018  ? Procedure: INTRAMEDULLARY (IM) NAIL TIBIAL;  Surgeon: Altamese Crosspointe, MD;  Location: [place];  Service: Orthopedics;  Laterality: Right;  ? ?HPI:  ?Pt is a 14 female presenting with inability to speak. Code stroke called initially but CTH negative and presentation more consistent with underlying metabolic process. Not able to have MRI. CXR without focal consolidation. Admitted with sepsis secondary to UTI and acute metabolic encephalopathy. Previous swallow eval in January 2014 recommended regular solids and thin liquids with use of small sips and lingual sweep to clear L pocketing. Evaluated  initially in the ED 5/8 recommending regular solids and thin liquids, but pt was noted to be coughing on thin liquids 5/9 so SLP was reordered.  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, HTN, HLD, breast ca, seizure d/o, chronic pain, anxiety  ? ?Assessment / Plan /  Recommendation ?Clinical Impression ? Pt participated in speech-language-cognition evaluation with her husband present. 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 Pt and her husband indicated that the pt had cognitive impairments after a CVA eight years prior. Pt cited difficulty maintaining her "train of thought" and with memory. Pt's husband reported that those cognitive impairments resolved and that she did not have any recent difficulty with cognition. Per the husband, he has been managing her medications for the past eight years and he currently manages finances as well. Pt and her husband reported acute changes in memory, orientation, and processing. Formal cognitive-linguistic assessment was not completed per referring MD's request. Informal assessment revealed impairments in attention, processing speed, temporal orientation, memory, awareness, complex problem solving, global coherence, and cohesion. Skilled SLP services are clinically indicated at this time. ?   ?SLP Assessment ? SLP Recommendation/Assessment: Patient needs continued [facility] Services ?SLP Visit Diagnosis: Cognitive communication deficit (R41.841)  ?  ?Recommendations for follow up therapy are one component of a multi-disciplinary discharge planning process, led by the attending physician.  Recommendations may be updated based on patient status, additional functional criteria and insurance authorization. ?   ?Follow Up Recommendations ? Skilled nursing-short term rehab (<3 hours/day)  ?  ?Assistance Recommended at Discharge ? Frequent or constant Supervision/Assistance  ?Functional Status Assessment Patient has had a recent decline in their functional status and demonstrates the ability to make significant improvements in function in a reasonable and predictable amount of time.  ?Frequency and Duration min 2x/week  ?2 weeks ?  ?   ?SLP Evaluation ?Cognition ? Overall Cognitive Status:  Impaired/Different from baseline ?Arousal/Alertness: Awake/alert ?Orientation Level: Oriented to person;Oriented to place;Disoriented to time;Oriented to situation ?Year:  (2004) ?Month: May ?Day of Week: Incorrect (Tuesday) ?Attention: Focused;Sustained;Selective ?Focused Attention: Appears intact ?Sustained Attention: Impaired ?Sustained Attention Impairment: Verbal complex ?Selective Attention: Impaired ?Selective Attention Impairment: Verbal basic ?Memory: Impaired ?Memory Impairment: Retrieval deficit;Decreased short term memory (Immedite: 3/3; delayed: 2/3; with cue: 0/1; episodic events: cues needed) ?Awareness: Impaired ?Awareness Impairment: Emergent impairment ?Problem Solving: Impaired ?Problem Solving Impairment: Verbal complex (Time: 0/2) ?Executive Function: Sequencing;Organizing ?Sequencing: Impaired ?Sequencing Impairment: Verbal basic  ?  ?   ?Comprehension ? Auditory Comprehension ?Overall Auditory Comprehension: Appears within functional limits for tasks assessed ?Yes/No Questions: Within Functional Limits ?Commands: Within Functional Limits ?Conversation: Complex  ?  ?Expression Expression ?Primary Mode of Expression: Verbal ?Verbal Expression ?Initiation: No impairment ?Level of Generative/Spontaneous Verbalization: Conversation ?Repetition: No impairment ?Naming: No impairment ?Pragmatics: Impairment ?Impairments: Topic maintenance;Topic appropriateness   ?Oral / Motor ? Oral Motor/Sensory Function ?Overall Oral Motor/Sensory Function: Mild impairment ?Facial ROM: Reduced left ?Facial Symmetry: Abnormal symmetry left ?Facial Strength: Reduced left ?Lingual ROM: Within Functional Limits ?Lingual Symmetry: Within Functional Limits ?Lingual Strength: Reduced ?Motor Speech ?Overall Motor Speech: Appears within functional limits for tasks assessed ?Respiration: Within functional limits ?Phonation: Normal ?Resonance: Within functional limits ?Articulation: Within functional limitis ?Intelligibility:  Intelligible ?Motor Planning: Witnin functional limits ?Motor Speech Errors: Not applicable   ?        ?Marcelo Ickes I. Hardin Negus, Ilyssa Grennan, CCC-SLP ?Acute Rehabilitation Services ?Office number [phone] ?Pager [phone]

## 2021-12-24 NOTE — Progress Notes (Signed)
Physical Therapy Treatment ?Patient Details ?Name: Stacy Diaz ?MRN: [id number] ?DOB: 1951-06-07 ?Today's Date: 12/24/2021 ? ? ?History of Present Illness Stacy Diaz is a 71 y.o. female who present to ED with confusion, inability to speak and L UE weakness. CT negative for stroke. Pt found to have UTI.  PMH: hypertension, hyperlipidemia, CVA, CAD, HFrEF,  meningioma  in 1998 s/p resection with residual paraplegia, breast cancer, seizure disorder, chronic pain, and hyponatremia ? ?  ?PT Comments  ? ? Received pt semi-reclined in bed with husband and case manager present, discussing plan for D/C to SNF. Pt's husband brought bilateral AFO's and pivot disc, however demanded that pt not transfer into recliner and initially refusing PT stating "PT just gets her in the recliner, leaves her, and she gets stuck". After building rapport, pt and husband agreed to work on core strength sitting EOB. Pt continues to require total A +2 for bed mobility. Pt able to maintain dynamic sitting balance with mod A +2 and static sitting balance with min A +2. While sitting EOB, worked on anterior/lateral leans at various heights/angles with target to reach for therapist's hand with emphasis on core control/stability, reaching outside BOS, and crossing midline. Acute PT to cont to follow.  ?   ?Recommendations for follow up therapy are one component of a multi-disciplinary discharge planning process, led by the attending physician.  Recommendations may be updated based on patient status, additional functional criteria and insurance authorization. ? ?Follow Up Recommendations ? Skilled nursing-short term rehab (<3 hours/day) ?  ?  ?Assistance Recommended at Discharge Frequent or constant Supervision/Assistance  ?Patient can return home with the following Assistance with cooking/housework;Direct supervision/assist for medications management;Direct supervision/assist for financial management;Assist for transportation;Help with stairs  or ramp for entrance;Two people to help with walking and/or transfers;Two people to help with bathing/dressing/bathroom;Assistance with feeding ?  ?Equipment Recommendations ? None recommended by PT  ?  ?Recommendations for Other Services   ? ? ?  ?Precautions / Restrictions Precautions ?Precautions: Fall ?Precaution Comments: paraplegic ?Restrictions ?Weight Bearing Restrictions: No  ?  ? ?Mobility ? Bed Mobility ?Overal bed mobility: Needs Assistance ?Bed Mobility: Rolling, Supine to Sit, Sit to Supine ?Rolling: +2 for physical assistance ?  ?Supine to sit: +2 for physical assistance, Total assist ?Sit to supine: Total assist, +2 for physical assistance ?  ?General bed mobility comments: pt able to reach RUE for L bedrail but unable to roll into L sidelying. Use of helicopter method once BLE's were off of bed ?Patient Response: Cooperative ? ?Transfers ?  ?  ?  ?  ?  ?  ?  ?  ?  ?General transfer comment: Pt and husband refused ?  ? ?Ambulation/Gait ?  ?  ?  ?  ?  ?  ?  ?  ? ? ?Stairs ?  ?  ?  ?  ?  ? ? ?Wheelchair Mobility ?  ? ?Modified Rankin (Stroke Patients Only) ?  ? ? ?  ?Balance Overall balance assessment: Needs assistance ?Sitting-balance support: Feet supported, Single extremity supported ?Sitting balance-Leahy Scale: Zero ?Sitting balance - Comments: pt required mod A of 2 fading to min of 2 for static/dyrnamic sitting balance. Pt wth posterior pelvic tilt, essentially sacral sitting ?Postural control: Posterior lean ?  ?  ?  ?  ?  ?  ?  ?  ?  ?  ?  ?  ?  ?  ?  ? ?  ?Cognition Arousal/Alertness: Awake/alert ?Behavior During Therapy: [facility] for  tasks assessed/performed ?Overall Cognitive Status: Impaired/Different from baseline ?  ?  ?  ?  ?  ?  ?  ?  ?  ?  ?  ?  ?  ?  ?  ?Problem Solving: Slow processing, Decreased initiation, Difficulty sequencing, Requires verbal cues, Requires tactile cues ?General Comments: husband present at bedside and adamant that pt is NOT to transfer to recliner, as pt will get  "stuck" ?  ?  ? ?  ?Exercises   ? ?  ?General Comments General comments (skin integrity, edema, etc.): pt reported fatigue in back and R shoulder with activity sitting EOB ?  ?  ? ?Pertinent Vitals/Pain Pain Assessment ?Pain Assessment: No/denies pain  ? ? ?Home Living   ?  ?Available Help at Discharge: Family;Personal care attendant;Available 24 hours/day ?Type of Home: House ?  ?  ?  ?  ?  ?  ?   ?  ?Prior Function    ?  ?  ?   ? ?PT Goals (current goals can now be found in the care plan section) Acute Rehab PT Goals ?PT Goal Formulation: With patient/family ?Time For Goal Achievement: 01/06/22 ?Potential to Achieve Goals: Fair ? ?  ?Frequency ? ? ? Min 3X/week ? ? ? ?  ?PT Plan Current plan remains appropriate  ? ? ?Co-evaluation   ?  ?PT goals addressed during session: Balance;Mobility/safety with mobility ?  ?  ? ?  ?AM-PAC PT "6 Clicks" Mobility   ?Outcome Measure ? Help needed turning from your back to your side while in a flat bed without using bedrails?: Total ?Help needed moving from lying on your back to sitting on the side of a flat bed without using bedrails?: Total ?Help needed moving to and from a bed to a chair (including a wheelchair)?: Total ?Help needed standing up from a chair using your arms (e.g., wheelchair or bedside chair)?: Total ?Help needed to walk in hospital room?: Total ?Help needed climbing 3-5 steps with a railing? : Total ?6 Click Score: 6 ? ?  ?End of Session   ?Activity Tolerance: Patient tolerated treatment well ?Patient left: in bed;with bed alarm set;with family/visitor present;with call bell/phone within reach;Other (comment) (with Prevalon boots applied for pressure relief) ?Nurse Communication: Mobility status ?PT Visit Diagnosis: Unsteadiness on feet (R26.81);Muscle weakness (generalized) (M62.81);Other abnormalities of gait and mobility (R26.89);Other (comment) (parapelegic) ?  ? ? ?Time: 0233-4356 ?PT Time Calculation (min) (ACUTE ONLY): 26 min ? ?Charges:  $Therapeutic  Activity: 23-37 mins          ?          ? ?Becky Sax PT, DPT  ?Blenda Nicely ?12/24/2021, 10:21 AM ? ?

## 2021-12-24 NOTE — Plan of Care (Signed)
?  Problem: Urinary Elimination: ?Goal: Signs and symptoms of infection will decrease ?Outcome: Progressing ?  ?Problem: Health Behavior/Discharge Planning: ?Goal: Ability to manage health-related needs will improve ?Outcome: Progressing ?  ?Problem: Clinical Measurements: ?Goal: Ability to maintain clinical measurements within normal limits will improve ?Outcome: Progressing ?Goal: Will remain free from infection ?Outcome: Progressing ?Goal: Diagnostic test results will improve ?Outcome: Progressing ?Goal: Respiratory complications will improve ?Outcome: Progressing ?Goal: Cardiovascular complication will be avoided ?Outcome: Progressing ?  ?Problem: Activity: ?Goal: Risk for activity intolerance will decrease ?Outcome: Progressing ?  ?Problem: Nutrition: ?Goal: Adequate nutrition will be maintained ?Outcome: Progressing ?  ?Problem: Coping: ?Goal: Level of anxiety will decrease ?Outcome: Progressing ?  ?Problem: Elimination: ?Goal: Will not experience complications related to bowel motility ?Outcome: Progressing ?Goal: Will not experience complications related to urinary retention ?Outcome: Progressing ?  ?Problem: Pain Managment: ?Goal: General experience of comfort will improve ?Outcome: Progressing ?  ?Problem: Safety: ?Goal: Ability to remain free from injury will improve ?Outcome: Progressing ?  ?Problem: Skin Integrity: ?Goal: Risk for impaired skin integrity will decrease ?Outcome: Progressing ?  ?

## 2021-12-24 NOTE — Code Documentation (Signed)
Stroke Response Nurse Documentation ?Code Documentation ? ?Stacy Diaz is a 71 y.o. female admitted to [facility]  on 12-22-2021 for urosepsis with past medical hx of HTN, CVA, meningioma s/p resection with residual paraplegia, seizures, chronic pain. On clopidogrel 75 mg daily. Code stroke was activated by attending MD .  ? ?Patient on 3Wunit where she was LKW at 1515 and now complaining of worsening Left side weakness and right gaze.  Per husband she has had increasing left side from 3:30pm until about 4pm.  Alerted staff. ? ?Stroke team at the bedside after patient activation. Patient to CT with team. NIHSS 17, see documentation for details and code stroke times. Patient with right gaze preference , left hemianopia, left facial droop, left arm weakness, and bilateral leg weakness on exam. The following imaging was completed:  CT Head and CTA. Patient is not a candidate for IV Thrombolytic due to stroke not suspected. Patient is not a candidate for IR due to no LVO on CTA.  ?Code Stroke cancelled due to suspicion of seizure rather than stroke.  MD adjusting medications. ? ?Care/Plan: mNIHSS and VS q 2 hours.  ? ?Bedside handoff with RN Lowanda Foster.   ? ?Raliegh Ip  ?Stroke Response RN ?  ?

## 2021-12-24 NOTE — Progress Notes (Signed)
Neurology progress note and stroke code documentation ? ?Please see prior consult notes from neurology this hospitalization. This is a patient v well known to [place] with hx multiple prior strokes as well as meningioma resection with baseline L hemiparesis and recurrent episodes aphasia/AMS and worsening of chronic LUE weakness c/f focal seizures. She is f/b Dr. Leonie Man. She was seen by [place] on admission 3 days ago after a typical event favored to be 2/2 seizure. She had another event this afternoon that is typical for her consisting of aphasia and worsening of baseline LUE weakness. LKW 1500. TNK not administered 2/2 improving sx and high suspicion for seizure over stroke. Head CT and CTA H&N personally reviewed and showed no new findings. Stroke code was cancelled. ? ?Vitals:  ? 12/24/21 1650 12/24/21 2022  ?BP: 131/68 (!) 114/58  ?Pulse: 72 69  ?Resp:  18  ?Temp:  98 ?F (36.7 ?C)  ?SpO2: 99% 100%  ? ? ?Physical Exam ?Gen: A&Ox4, NAD ?Resp: CTAB, normal work of breathing ?CV: RRR, extremities appear well-perfused. ? ?Neuro: ?*MS: A&O x4. Follows simple commands. ?*Speech: mild dysarthria, mild WFD ?*CN: PERRL, blinks to threat bilat, EOMI, L UMN facial droop, hearing intact to voice ?*Motor: RUE 5/5 no drift, no movement against gravity LUE or BLE ?*Sensory: SILT. L hemineglect. ?*Reflexes: 2+ brisk on L, toes mute ?*Coordination, gait: UTA ? ?NIHSS ?1a Level of Conscious.: 0 ?1b LOC Questions: 0 ?1c LOC Commands: 0 ?2 Best Gaze: 0 ?3 Visual: 0 ?4 Facial Palsy: 1 ?5a Motor Arm - left: 3 ?5b Motor Arm - Right: 0 ?6a Motor Leg - Left:  ?6b Motor Leg - Right: 3 ?7 Limb Ataxia: 3 ?8 Sensory: 0 ?9 Best Language: 1 ?10 Dysarthria: 1 ?11 Extinct. and Inatten.: 1 ? ?TOTAL: 13 ? ?Premorbid mRS = 4 ? ?A/P: This is a patient v well known to [place] with hx multiple prior strokes as well as meningioma resection with baseline L hemiparesis and recurrent episodes aphasia/AMS and worsening of chronic LUE weakness c/f focal seizures. She is  f/b Dr. Leonie Man. She is uncontrolled on trileptal (current dose 300/600). Given her poor control on this medication and the fact that there is no IV form available for use when she comes in with breakthrough seizures I recommend cross-taper to lacosamide as follows: ?- Load vimpat '200mg'$  IV now f/b vimpat '50mg'$  bid po x2 days f/b vimpat '100mg'$  bid after that ?- Decrease trileptal to '300mg'$  bid x2 days f/b '150mg'$  bid x2 days f/b '150mg'$  daily x2 days then stop ?At the end of this cross taper patient should be on vimpat '100mg'$  bid monotherapy ?- I will see patient again in AM to discuss AED plan with her and her husband. I discussed it with Dr. Leonie Man by phone and he is in agreement. ?- No further stroke workup indicated at this time ?- F/u after discharge with Dr. Leonie Man as scheduled ? ?Su Monks, MD ?[facility] ?[phone] ? ?If 7pm- 7am, please page neurology on call as listed in [place]. ? ?

## 2021-12-25 DIAGNOSIS — A419 Sepsis, unspecified organism: Secondary | ICD-10-CM | POA: Diagnosis not present

## 2021-12-25 DIAGNOSIS — R6520 Severe sepsis without septic shock: Secondary | ICD-10-CM | POA: Diagnosis not present

## 2021-12-25 DIAGNOSIS — G40909 Epilepsy, unspecified, not intractable, without status epilepticus: Secondary | ICD-10-CM | POA: Diagnosis not present

## 2021-12-25 LAB — MAGNESIUM: Magnesium: 2 mg/dL (ref 1.7–2.4)

## 2021-12-25 LAB — BASIC METABOLIC PANEL
Anion gap: 11 (ref 5–15)
BUN: 10 mg/dL (ref 8–23)
CO2: 22 mmol/L (ref 22–32)
Calcium: 8.9 mg/dL (ref 8.9–10.3)
Chloride: 97 mmol/L — ABNORMAL LOW (ref 98–111)
Creatinine, Ser: 0.55 mg/dL (ref 0.44–1.00)
GFR, Estimated: >60 mL/min (ref >60)
Glucose, Bld: 116 mg/dL — ABNORMAL HIGH (ref 70–99)
Potassium: 4.2 mmol/L (ref 3.5–5.1)
Sodium: 130 mmol/L — ABNORMAL LOW (ref 135–145)

## 2021-12-25 LAB — GLUCOSE, CAPILLARY: Glucose-Capillary: 97 mg/dL (ref 70–99)

## 2021-12-25 MED ORDER — CALCIUM CARBONATE 1250 (500 CA) MG PO TABS
1.0000 | ORAL_TABLET | Freq: Two times a day (BID) | ORAL | Status: DC
  Administered 2021-12-25 – 2021-12-29 (×8): 500 mg via ORAL
  Filled 2021-12-25 (×8): qty 1

## 2021-12-25 MED ORDER — LACOSAMIDE 50 MG PO TABS
100.0000 mg | ORAL_TABLET | Freq: Two times a day (BID) | ORAL | Status: DC
  Administered 2021-12-25 – 2021-12-29 (×8): 100 mg via ORAL
  Filled 2021-12-25 (×8): qty 2

## 2021-12-25 MED ORDER — LORAZEPAM 2 MG/ML IJ SOLN
1.0000 mg | Freq: Once | INTRAMUSCULAR | Status: AC
  Administered 2021-12-25: 1 mg via INTRAVENOUS
  Filled 2021-12-25: qty 1

## 2021-12-25 NOTE — Progress Notes (Signed)
Last know well at 1800. Back in room at 1830 and observed patient with bilateral eye gaze deviation towards the right and took a few minutes before eye gaze moved towards center, states that her tongue feels numb and has numbness to her left facial cheek. VVS. O2 Sat at 100% on R/A. Message sent to attending/Dr. Thereasa Solo of change in status, charge nurse notified of change in status who then notified RRT.  ?

## 2021-12-25 NOTE — TOC Progression Note (Signed)
Transition of Care (TOC) - Progression Note  ? ? ?Patient Details  ?Name: Stacy Diaz ?MRN: [id number] ?Date of Birth: [birth year] ? ?Transition of Care (TOC) CM/SW Contact  ?[name], RN ?Phone Number: ?12/25/2021, 3:22 PM ? ?Clinical Narrative:    ?Pt denied for SNF rehab today as [facility] medical director feels she is not medically ready. MD and family updated.  ?Can restart authorization process in am if patient is medically ready.  ? ? ?Expected Discharge Plan: [place] ?Barriers to Discharge: Continued Medical Work up ? ?Expected Discharge Plan and Services ?Expected Discharge Plan: [place] ?  ?Discharge Planning Services: CM Consult ?Post Acute Care Choice: [place] ?Living arrangements for the past 2 months: [place] ?                ?  ?  ?  ?  ?  ?  ?  ?  ?  ?  ? ? ?Social Determinants of Health (SDOH) Interventions ?  ? ?Readmission Risk Interventions ?   ? View : No data to display.  ?  ?  ?  ? ? ?

## 2021-12-25 NOTE — Significant Event (Addendum)
Rapid Response Event Note  ? ?Reason for Call :  ?Called d/t concern of stroke-like symptoms. ? ?Pt has a h/o seizures and multiple strokes. Pt had same presentation yesterday for which a Code stroke called, all imaging was negative for acute changes, Code Stroke was cancelled, and pt was treated for seizures.  ?Initial Focused Assessment:  ?Pt lying in bed with eyes open. Pt is alert and oriented, able to answer questions, and follow commands. She has a R sided forced gaze, L hemianopia, L facial droop, mild dysarthria, L arm paralysis, BLE weakness, NIH is 17. Per RN [id number], symptoms were noticed at 1830, and no seizure-like activity was witnessed. Per pt, she wasn't aware of having a seizure but she says she is usually not aware when she has one.  ? ?T-98.3, HR-70, BP-134/65, RR-16, SpO2-99% ? ?Interventions:  ?CBG-97 ?Ativan '1mg'$  IV ?Vimpat increased to 100 mg PO BID. ?Plan of Care:  ?Code stroke not initiated per Neuro MD-pt with exact presentation yesterday and high suspicion of seizure. Give Ativan and monitor response. Vimpat does increased. Continue to monitor pt closely. Call RRT if further assistance needed.  ? ?Event Summary:  ? ?MD Notified: Dr. Lorrin Goodell notified at 1904 ?Call [phone] ?Arrival [id number] ?End Time:1920 ? ?Dillard Essex, RN ?

## 2021-12-25 NOTE — Telephone Encounter (Signed)
I spoke to the patient's husband who understands the plan. He appreciated the call back. ?

## 2021-12-25 NOTE — Progress Notes (Signed)
RN walked into room and patient confused, stating, "I need to get out of this, I'm stuck." RN began a neuro assessment asking patient to follow finger movements with eyes when patient stared straight ahead, stopped talking with mouth wide open, and appeared like she was having an absent seizure which lasted less than 1 minute. Afterwards, patient confused to place, time, and situation. Within 15 mins from this episode patient fully reoriented and recalls events. No other neurological changes noted at this time or during event. Neurology called to make aware.  ?

## 2021-12-25 NOTE — Progress Notes (Signed)
Neurology progress note ? ?S: Patient seen at bedside, feeling well, at neurologic baseline. Tolerating vimpat well. No new neurologic complaints today. No further events concerning for clinical seizure since yesterday. ? ?O: ? ?Vitals:  ? 12/25/21 0743 12/25/21 1633  ?BP: 122/61 (!) 100/51  ?Pulse: (!) 59 63  ?Resp: 16 16  ?Temp: 98 ?F (36.7 ?C) 97.8 ?F (36.6 ?C)  ?SpO2: 100% 100%  ? ?Physical Exam ?Gen: A&Ox4, NAD ?Resp: CTAB, normal work of breathing ?CV: RRR, extremities appear well-perfused. ?  ?Neuro: ?*MS: A&O x4. Follows multi-step commands ?*Speech: mild dysarthria, mild WFD ?*CN: PERRL, blinks to threat bilat, EOMI, L UMN facial droop, hearing intact to voice ?*Motor: RUE 5/5 no drift, anti-gravity BUE, movement BLE but none against gravity ?*Sensory: SILT. L hemineglect is improved ?*Reflexes: 2+ brisk on L, toes mute ?*Coordination, gait: UTA ? ?A/P: A/P: This is a patient v well known to [place] with hx multiple prior strokes as well as meningioma resection with baseline L hemiparesis and recurrent episodes aphasia/AMS and worsening of chronic LUE weakness c/f focal seizures. She is f/b Dr. Leonie Man. She is uncontrolled on trileptal (current dose 300/600). Given her poor control on this medication and the fact that there is no IV form available for use when she comes in with breakthrough seizures I recommended cross-taper to lacosamide as follows: ?- S/p vimpat load; cont vimpat '50mg'$  bid po x2 days f/b vimpat '100mg'$  bid after that ?- Decrease trileptal to '300mg'$  bid x2 days f/b '150mg'$  bid x2 days f/b '150mg'$  daily x2 days then stop ?At the end of this cross taper patient should be on vimpat '100mg'$  bid monotherapy ?- I discussed AED plan with patient and her husband at bedside today and they are amenable.  ?- Crosstaper orders entered. ?- No further stroke workup indicated at this time ?- F/u after discharge with Dr. Leonie Man as scheduled ? ?No further inpatient neurologic work-up indicated at this time.  Neurology to  sign off however feel free to reengage if additional neurologic concerns arise. ?

## 2021-12-25 NOTE — Progress Notes (Signed)
? Stacy Diaz  CHE:[id number] DOB: 1951-06-10 DOA: 12/22/2021 ?PCP: Isaac Bliss, Rayford Halsted, MD   ? ?Brief Narrative:  ?71yo with a history of premature CVD and prior stroke, meningioma status post resection 1999 with spastic paraplegia requiring wheelchair, systolic CHF with a EF 53-61%, chronic pain, seizure disorder, breast cancer status postlumpectomy and radiation, and HTN who awoke the morning of her admission unable to speak with possible left arm weakness.  Code stroke was initiated upon ER arrival but upon evaluation neurology felt her symptoms most likely represented acute metabolic encephalopathy due to UTI. ? ?Consultants:  ?Neurology ? ?Code Status: FULL CODE ? ?DVT prophylaxis: ?Lovenox ? ?Interim Hx: ?Following the events of yesterday afternoon, which were believed to have represented seizure activity, the patient has had an uneventful night.  She is afebrile.  Vital signs are stable.  CT head/CTa head obtained during code stroke yesterday was fortunately without acute findings.  Neurology is changing her seizure medication.  The patient is alert oriented and at her baseline at the time of my eval.  She is jovial and in good spirits.  She is highly motivated and looking forward to participating in rehab.  She denies chest pain shortness of breath fevers or chills.  Her speech is clear and she has no cranial nerve deficits.  Her visual fields are all intact and she has returned to her baseline left upper extremity strength and range of motion. ? ?The patient is medically cleared and in my medical opinion as her attending physician is ready for rehab.  Unfortunately her insurance company is requiring an additional 24-hour hospital stay before they will consider authorizing a skilled nursing facility rehab stay and therefore our hands are tied.   ? ?Assessment & Plan: ? ?Severe sepsis due to E. coli and Proteus UTI ?Presented with tachycardia, tachypnea, leukocytosis and decreased mentation, with  lactate 2.4 -treated initially with Rocephin to which both organisms have proven to be sensitive - transitioned to Keflex to complete 7 full days of treatment ? ?Acute metabolic encephalopathy ?CT head showed old encephalomalacia superior cerebrum, chronic ischemic microangiopathy, old craniectomy - EEG without acute findings - Neurology felt likely not stroke but rather generalized encephalopathy from infection in setting of poor cognitive reserve - with recrudescence of symptoms 12/24/21 stroke was ruled out again, but uncontrolled seizure is highest on our differential - seizure medicines being adjusted by Neurology and patient is tolerating this well ? ?Seizure disorder ?As per discussion above -careful medication titration/adjustment being carried out by neurology ?- Load vimpat '200mg'$  IV f/b vimpat '50mg'$  bid po x2 days f/b vimpat '100mg'$  bid after that ?- Decrease trileptal to '300mg'$  bid x2 days f/b '150mg'$  bid x2 days f/b '150mg'$  daily x2 days then stop ?At the end of this cross taper patient should be on vimpat '100mg'$  bid monotherapy ? ?Hyperkalemia ?K 5.5 on admission, resolved with fluids ? ?Transaminitis ?Likely septic injury or fatty liver -now resolved with LFTs normal ? ?Hypocalcemia ?ionized CA is mildly low at 4.1 -continue calcium replacement ? ?Chronic systolic CHF  ?euvolemic on exam- not on Lasix at home - continue BB, lisinopril ? ?Polycythemia ?Resolved with fluids -likely simple dehydration ? ?Chronic pain ?Continue home oxycodone, baclofen ? ?CAD / Cerebrovascular disease ?Continue atorvastatin, Plavix, lisinopril, metoprolol ? ?Chronic hyponatremia ?Stable with sodium range 124 -133 ? ?Chronic paraplegia  ?S/p meningioma resection in 1999 ? ?Essential hypertension ?BP currently well controlled - continue lisinopril, metoprolol ? ?Hyperlipidemia ?Continue statin  ? ?Family Communication: Spoke  with husband at bedside at length who is fully in support of the rehab stay at SNF ?Disposition: SNF rehab stay  recommended by therapy -medically stable for discharge but awaiting insurance authorization ? ? ?Objective: ?Blood pressure 122/61, pulse (!) 59, temperature 98 ?F (36.7 ?C), temperature source Oral, resp. rate 16, height '5\' 5"'$  (1.651 m), weight 76.7 kg, SpO2 100 %. ? ?Intake/Output Summary (Last 24 hours) at 12/25/2021 0838 ?Last data filed at 12/24/2021 2022 ?Gross per 24 hour  ?Intake --  ?Output 1350 ml  ?Net -1350 ml  ? ? ?Filed Weights  ? 12/22/21 0220 12/23/21 0500 12/24/21 0500  ?Weight: 79.4 kg 74.6 kg 76.7 kg  ? ? ?Examination: ?General: No acute respiratory distress ?Lungs: Clear to auscultation bilaterally -no wheezing ?Cardiovascular: RRR without murmur or rub ?Abdomen: NT/ND, soft, BS positive ?Extremities: No significant edema BLE ?Neuro: Has returned to baseline with no cranial nerve deficits, limited fine motor movement of left hand but nearly full ROM of left arm -sitting up in a chair independently at the time of my exam ? ?CBC: ?Recent Labs  ?Lab 12/22/21 ?0200 12/22/21 ?0205 12/23/21 ?0158 12/24/21 ?0306  ?WBC 12.4*  --  14.8* 10.0  ?NEUTROABS 6.0  --   --   --   ?HGB 16.4* 16.3* 14.4 13.9  ?HCT 46.5* 48.0* 41.3 39.5  ?MCV 90.3  --  88.1 88.4  ?PLT 294  --  280 227  ? ? ?Basic Metabolic Panel: ?Recent Labs  ?Lab 12/22/21 ?3716 12/23/21 ?0158 12/24/21 ?9678 12/25/21 ?0326  ?NA 133* 126* 126* 130*  ?K 3.8 3.6 4.2 4.2  ?CL 99 96* 96* 97*  ?CO2 20* 19* 19* 22  ?GLUCOSE 144* 130* 123* 116*  ?BUN '9 11 13 10  '$ ?CREATININE 0.63 0.69 0.62 0.55  ?CALCIUM 9.4 9.1 8.7* 8.9  ?MG 1.8  --   --  2.0  ? ? ?GFR: ?Estimated Creatinine Clearance: 67 mL/min (by C-G formula based on SCr of 0.55 mg/dL). ? ?Liver Function Tests: ?Recent Labs  ?Lab 12/22/21 ?0200 12/23/21 ?0158 12/24/21 ?0306  ?AST 45* 35 35  ?ALT '25 24 26  '$ ?ALKPHOS 53 45 43  ?BILITOT 0.8 0.9 0.8  ?PROT 7.0 6.7 6.2*  ?ALBUMIN 4.4 3.9 3.7  ? ? ?Scheduled Meds: ? atorvastatin  10 mg Oral q1800  ? baclofen  20 mg Oral QID  ? cephALEXin  500 mg Oral Q12H  ?  clopidogrel  75 mg Oral Daily  ? diazepam  5 mg Oral Daily  ? enoxaparin (LOVENOX) injection  40 mg Subcutaneous Q24H  ? lacosamide  50 mg Oral BID  ? Followed by  ? [START ON 12/27/2021] lacosamide  100 mg Oral BID  ? lisinopril  40 mg Oral Daily  ? liver oil-zinc oxide  1 application. Topical QHS  ? melatonin  5 mg Oral QHS  ? metoprolol tartrate  100 mg Oral BID  ? OXcarbazepine  300 mg Oral BID  ? Followed by  ? [START ON 12/26/2021] OXcarbazepine  150 mg Oral BID  ? Followed by  ? [START ON 12/28/2021] OXcarbazepine  150 mg Oral Daily  ? polyethylene glycol  17 g Oral Weekly  ? polyvinyl alcohol  1 drop Both Eyes Daily  ? ? ? LOS: 3 days  ? ?Cherene Altes, MD ?[facility] ?Office  [phone] ?Pager - Text Page per Shea Evans ? ?If 7PM-7AM, please contact night-coverage per Amion ?12/25/2021, 8:38 AM ? ? ? ? ?

## 2021-12-25 NOTE — Progress Notes (Signed)
Physical Therapy Treatment ?Patient Details ?Name: Stacy Diaz ?MRN: [id number] ?DOB: 09/22/1950 ?Today's Date: 12/25/2021 ? ? ?History of Present Illness Stacy Diaz is a 71 y.o. female who present to ED with confusion, inability to speak and L UE weakness. CT negative for stroke. Pt found to have UTI.  PMH: hypertension, hyperlipidemia, CVA, CAD, HFrEF,  meningioma  in 1998 s/p resection with residual paraplegia, breast cancer, seizure disorder, chronic pain, and hyponatremia ? ?  ?PT Comments  ? ? Pt progressing towards goals. Was able to progress to performing transfers to chair and also assisted with bathing tasks while sitting on BSC. Requiring up to mod A to bring trunk forwards and back to allow pt to wash BLE. Pt using RUE throughout secondary to LUE weakness. During transfer, used pivot board and performed squat pivot with total A +2. Pt tolerated well. Prior to admission, pt was able to perform upper body ADLs independently and able to transfer using pivot board with only minimal A from husband. Now requiring significant +2 assist to perform mobility tasks, so feel she is an excellent candidate for SNF level therapies to regain independence. Will continue to follow acutely to maximize functional mobility independence and safety.  ?   ?Recommendations for follow up therapy are one component of a multi-disciplinary discharge planning process, led by the attending physician.  Recommendations may be updated based on patient status, additional functional criteria and insurance authorization. ? ?Follow Up Recommendations ? Skilled nursing-short term rehab (<3 hours/day) ?  ?  ?Assistance Recommended at Discharge Frequent or constant Supervision/Assistance  ?Patient can return home with the following Assistance with cooking/housework;Direct supervision/assist for medications management;Direct supervision/assist for financial management;Assist for transportation;Help with stairs or ramp for entrance;Two  people to help with walking and/or transfers;Two people to help with bathing/dressing/bathroom;Assistance with feeding ?  ?Equipment Recommendations ? None recommended by PT  ?  ?Recommendations for Other Services   ? ? ?  ?Precautions / Restrictions Precautions ?Precautions: Fall ?Precaution Comments: paraplegic ?Restrictions ?Weight Bearing Restrictions: No  ?  ? ?Mobility ? Bed Mobility ?  ?  ?  ?  ?  ?  ?  ?General bed mobility comments: Up on BSC upon entry ?  ? ?Transfers ?Overall transfer level: Needs assistance ?Equipment used:  (pivot board) ?Transfers: Bed to chair/wheelchair/BSC ?  ?  ?  ?Squat pivot transfers: Total assist, +2 physical assistance ?  ?  ?General transfer comment: Using pivot board, performed squat pivot from [facility] to chair. Pt with increased fatigue, but was able to help some. Noted significant weakness in BLE and unable to stand fully upright. ?  ? ?Ambulation/Gait ?  ?  ?  ?  ?  ?  ?  ?  ? ? ?Stairs ?  ?  ?  ?  ?  ? ? ?Wheelchair Mobility ?  ? ?Modified Rankin (Stroke Patients Only) ?  ? ? ?  ?Balance Overall balance assessment: Needs assistance ?Sitting-balance support: Feet supported ?Sitting balance-Leahy Scale: Poor ?Sitting balance - Comments: Up to mod A for sitting balance when performing bathing tasks. Was able to bring trunk forward to assist with bathing LEs. With increased fatigued pt with lateral lean to R and then to L. ?  ?  ?  ?  ?  ?  ?  ?  ?  ?  ?  ?  ?  ?  ?  ?  ? ?  ?Cognition Arousal/Alertness: Awake/alert ?Behavior During Therapy: [facility] for tasks assessed/performed ?Overall Cognitive  Status: Impaired/Different from baseline ?Area of Impairment: Attention, Following commands, Safety/judgement, Awareness, Problem solving ?  ?  ?  ?  ?  ?  ?  ?  ?  ?Current Attention Level: Sustained ?  ?Following Commands: Follows one step commands with increased time ?Safety/Judgement: Decreased awareness of deficits ?Awareness: Emergent ?Problem Solving: Slow processing, Decreased  initiation, Difficulty sequencing, Requires verbal cues, Requires tactile cues ?General Comments: Pt motivated this session to work with therapy this session. Multiple brief periods where pt with flat affect, but likely secondary to fatigue. ?  ?  ? ?  ?Exercises   ? ?  ?General Comments General comments (skin integrity, edema, etc.): Pt's husband present and supportive ?  ?  ? ?Pertinent Vitals/Pain Pain Assessment ?Pain Assessment: No/denies pain  ? ? ?Home Living   ?  ?  ?  ?  ?  ?  ?  ?  ?  ?   ?  ?Prior Function    ?  ?  ?   ? ?PT Goals (current goals can now be found in the care plan section) Acute Rehab PT Goals ?Patient Stated Goal: to get better ?PT Goal Formulation: With patient/family ?Time For Goal Achievement: 01/06/22 ?Potential to Achieve Goals: Fair ?Progress towards PT goals: Progressing toward goals ? ?  ?Frequency ? ? ? Min 3X/week ? ? ? ?  ?PT Plan Current plan remains appropriate  ? ? ?Co-evaluation PT/OT/SLP Co-Evaluation/Treatment: Yes ?Reason for Co-Treatment: Complexity of the patient's impairments (multi-system involvement);For patient/therapist safety;To address functional/ADL transfers ?PT goals addressed during session: Mobility/safety with mobility;Balance ?  ?  ? ?  ?AM-PAC PT "6 Clicks" Mobility   ?Outcome Measure ? Help needed turning from your back to your side while in a flat bed without using bedrails?: A Lot ?Help needed moving from lying on your back to sitting on the side of a flat bed without using bedrails?: A Lot ?Help needed moving to and from a bed to a chair (including a wheelchair)?: Total ?Help needed standing up from a chair using your arms (e.g., wheelchair or bedside chair)?: Total ?Help needed to walk in hospital room?: Total ?Help needed climbing 3-5 steps with a railing? : Total ?6 Click Score: 8 ? ?  ?End of Session Equipment Utilized During Treatment: Gait belt ?Activity Tolerance: Patient tolerated treatment well ?Patient left: in chair;with call bell/phone  within reach;with family/visitor present ?Nurse Communication: Mobility status ?PT Visit Diagnosis: Unsteadiness on feet (R26.81);Muscle weakness (generalized) (M62.81);Other abnormalities of gait and mobility (R26.89) ?  ? ? ?Time: 4656-8127 ?PT Time Calculation (min) (ACUTE ONLY): 50 min ? ?Charges:  $Therapeutic Activity: 8-22 mins          ?          ? ?Reuel Derby, PT, DPT  ?Acute Rehabilitation Services  ?Pager: [phone] ?Office: [phone] ? ? ? ?Elizabeth ?12/25/2021, 12:53 PM ? ?

## 2021-12-25 NOTE — Progress Notes (Signed)
Occupational Therapy Treatment ?Patient Details ?Name: Stacy Diaz ?MRN: [id number] ?DOB: [birth year] ?Today's Date: 12/25/2021 ? ? ?History of present illness Stacy Diaz is a 71 y.o. female who present to ED with confusion, inability to speak and L UE weakness. CT negative for stroke. Pt found to have UTI.  PMH: hypertension, hyperlipidemia, CVA, CAD, HFrEF,  meningioma  in 1998 s/p resection with residual paraplegia, breast cancer, seizure disorder, chronic pain, and hyponatremia ?  ?OT comments ? Pt making progress towards OT goals, as she is very motivated. This session, pt tolerated increased time participating in seated ADL's, as well as transferring into recliner at end of session. Per pt and her husband, pt is presenting with major deficits, which were not presents before. Pt was able to complete UB ADL's independently, and LB bathing with min A, as well as transfers, using pivot board with min A. As of this session, pt is requiring total assist +2 for all transfers, using pivot board and squat pivot. She is requiring max A +1-2 for all bathing and dressing tasks due to increased LUE weakness/flaccidity, as well as mod assist for verbal cuing to initiate and sequence tasks. She is tolerating movement/activity more, as well as attempting to do all tasks presented to her. She is an excellent candidate for SNF level therapies to regain independence and reduce caregiver burden. OT will continue to follow acutely.   ? ?Recommendations for follow up therapy are one component of a multi-disciplinary discharge planning process, led by the attending physician.  Recommendations may be updated based on patient status, additional functional criteria and insurance authorization. ?   ?Follow Up Recommendations ? Skilled nursing-short term rehab (<3 hours/day)  ?  ?Assistance Recommended at Discharge Frequent or constant Supervision/Assistance  ?Patient can return home with the following ? Two people to help with  walking and/or transfers;Two people to help with bathing/dressing/bathroom;Assistance with cooking/housework;Assistance with feeding;Direct supervision/assist for medications management;Direct supervision/assist for financial management;Assist for transportation;Help with stairs or ramp for entrance ?  ?Equipment Recommendations ? Other (comment) (defer to next ventus)  ?  ?Recommendations for Other Services   ? ?  ?Precautions / Restrictions Precautions ?Precautions: Fall ?Precaution Comments: paraplegic ?Restrictions ?Weight Bearing Restrictions: No  ? ? ?  ? ?Mobility Bed Mobility ?  ?  ?  ?  ?  ?  ?  ?General bed mobility comments: Up on BSC upon entry ?  ? ?Transfers ?Overall transfer level: Needs assistance ?Equipment used:  (pivot board) ?Transfers: Bed to chair/wheelchair/BSC ?  ?  ?Squat pivot transfers: Total assist, +2 physical assistance ?  ?  ?  ?General transfer comment: Using pivot board, performed squat pivot from [facility] to chair. Pt with increased fatigue, but was able to help some. Noted significant weakness in BLE and unable to stand fully upright. ?  ?  ?Balance Overall balance assessment: Needs assistance ?Sitting-balance support: Feet supported ?Sitting balance-Leahy Scale: Poor ?Sitting balance - Comments: Up to mod A for sitting balance when performing bathing tasks. Was able to bring trunk forward to assist with bathing LEs. With increased fatigued pt with lateral lean to R and then to L. ?  ?  ?  ?  ?  ?  ?  ?  ?  ?  ?  ?  ?  ?  ?  ?   ? ?ADL either performed or assessed with clinical judgement  ? ?ADL Overall ADL's : Needs assistance/impaired ?  ?  ?Grooming: Wash/dry hands;Wash/dry face;Applying deodorant;Cueing for  sequencing;Sitting;Minimal assistance ?Grooming Details (indicate cue type and reason): completed on [facility], required cuing to initiate and complete tasks, cuing to get L side of face ?Upper Body Bathing: Maximal assistance;Sitting ?Upper Body Bathing Details (indicate cue type and  reason): Cuing for initiation and sequendcing, assist to get R side of body and under L arm due to L sided flaccidity. ?Lower Body Bathing: Maximal assistance;+2 for physical assistance;+2 for safety/equipment;Sitting/lateral leans ?Lower Body Bathing Details (indicate cue type and reason): Pt able to get thighs, knees, and upper shins, requring truncal support to lean forward. Total assist to attempt standing to complete pericare ?  ?  ?Lower Body Dressing: Total assistance;+2 for physical assistance;+2 for safety/equipment;Sit to/from stand ?Lower Body Dressing Details (indicate cue type and reason): to doff mesh panties ?Toilet Transfer: Total assistance;+2 for physical assistance;+2 for safety/equipment;Squat-pivot ?Toilet Transfer Details (indicate cue type and reason): Pt attempting to assist with transfer, requiring assist to lean forwar, keep feet together on pivot board, and total to shift hips to L to recliner. ?Toileting- Clothing Manipulation and Hygiene: Total assistance;+2 for physical assistance;+2 for safety/equipment;Sit to/from stand ?Toileting - Clothing Manipulation Details (indicate cue type and reason): Required +3, due to 2 people assisting with standing/squatting and 1 person wiping. ?  ?  ?  ?General ADL Comments: Pt presents with increased weakness, trying to assist with all tasks, reports her body feels heavy, but assisting as able. ?  ? ?Extremity/Trunk Assessment   ?  ?  ?  ?  ?  ? ?Vision   ?  ?  ?Perception   ?  ?Praxis   ?  ? ?Cognition Arousal/Alertness: Awake/alert ?Behavior During Therapy: [facility] for tasks assessed/performed ?Overall Cognitive Status: Impaired/Different from baseline ?Area of Impairment: Attention, Following commands, Safety/judgement, Awareness, Problem solving ?  ?  ?  ?  ?  ?  ?  ?  ?  ?Current Attention Level: Sustained ?  ?Following Commands: Follows one step commands with increased time ?Safety/Judgement: Decreased awareness of deficits ?Awareness:  Emergent ?Problem Solving: Slow processing, Decreased initiation, Difficulty sequencing, Requires verbal cues, Requires tactile cues ?General Comments: Pt motivated this session to work with therapy this session. Multiple brief periods where pt with flat affect, but likely secondary to fatigue. ?  ?  ?   ?Exercises   ? ?  ?Shoulder Instructions   ? ? ?  ?General Comments VSS on RA, pt husband present and supportivie  ? ? ?Pertinent Vitals/ Pain       Pain Assessment ?Pain Assessment: No/denies pain ? ?Home Living   ?  ?  ?  ?  ?  ?  ?  ?  ?  ?  ?  ?  ?  ?  ?  ?  ?  ?  ? ?  ?Prior Functioning/Environment    ?  ?  ?  ?   ? ?Frequency ? Min 2X/week  ? ? ? ? ?  ?Progress Toward Goals ? ?OT Goals(current goals can now be found in the care plan section) ? Progress towards OT goals: Progressing toward goals ? ?Acute Rehab OT Goals ?Patient Stated Goal: To get back some independence ?OT Goal Formulation: With patient ?Time For Goal Achievement: 01/06/22 ?Potential to Achieve Goals: Fair  ?Plan Discharge plan remains appropriate;Frequency remains appropriate   ? ?Co-evaluation ? ? ? PT/OT/SLP Co-Evaluation/Treatment: Yes ?Reason for Co-Treatment: Complexity of the patient's impairments (multi-system involvement);For patient/therapist safety;To address functional/ADL transfers ?PT goals addressed during session: Mobility/safety with mobility;Balance ?OT goals addressed  during session: ADL's and self-care;Other (comment) (transfers) ?  ? ?  ?AM-PAC OT "6 Clicks" Daily Activity     ?Outcome Measure ? ? Help from another person eating meals?: A Little ?Help from another person taking care of personal grooming?: A Lot ?Help from another person toileting, which includes using toliet, bedpan, or urinal?: Total ?Help from another person bathing (including washing, rinsing, drying)?: A Lot ?Help from another person to put on and taking off regular upper body clothing?: A Lot ?Help from another person to put on and taking off regular  lower body clothing?: Total ?6 Click Score: 11 ? ?  ?End of Session Equipment Utilized During Treatment: Gait belt;Other (comment) (pivot board) ? ?OT Visit Diagnosis: Unsteadiness on feet (R26.81);Other abnormaliti

## 2021-12-25 NOTE — Care Management Important Message (Signed)
Important Message ? ?Patient Details  ?Name: Stacy Diaz ?MRN: [id number] ?Date of Birth: Sep 11, 1950 ? ? ?Medicare Important Message Given:  Yes ? ? ? ? ?Lealand Elting ?12/25/2021, 2:52 PM ?

## 2021-12-26 DIAGNOSIS — R6520 Severe sepsis without septic shock: Secondary | ICD-10-CM | POA: Diagnosis not present

## 2021-12-26 DIAGNOSIS — G40909 Epilepsy, unspecified, not intractable, without status epilepticus: Secondary | ICD-10-CM | POA: Diagnosis not present

## 2021-12-26 DIAGNOSIS — A419 Sepsis, unspecified organism: Secondary | ICD-10-CM | POA: Diagnosis not present

## 2021-12-26 MED ORDER — LEVETIRACETAM 500 MG PO TABS
500.0000 mg | ORAL_TABLET | Freq: Two times a day (BID) | ORAL | Status: DC
  Administered 2021-12-26 – 2021-12-29 (×7): 500 mg via ORAL
  Filled 2021-12-26 (×8): qty 1

## 2021-12-26 MED ORDER — LEVETIRACETAM IN NACL 1000 MG/100ML IV SOLN
1000.0000 mg | Freq: Once | INTRAVENOUS | Status: AC
  Administered 2021-12-26: 1000 mg via INTRAVENOUS
  Filled 2021-12-26: qty 100

## 2021-12-26 MED ORDER — LISINOPRIL 20 MG PO TABS
20.0000 mg | ORAL_TABLET | Freq: Every day | ORAL | Status: DC
Start: 2021-12-27 — End: 2021-12-29
  Administered 2021-12-27 – 2021-12-29 (×3): 20 mg via ORAL
  Filled 2021-12-26 (×3): qty 1

## 2021-12-26 NOTE — TOC Progression Note (Addendum)
Transition of Care (TOC) - Progression Note  ? ? ?Patient Details  ?Name: Stacy Diaz ?MRN: [id number] ?Date of Birth: Oct 12, 1950 ? ?Transition of Care (TOC) CM/SW Contact  ?[name], RN ?Phone Number: ?12/26/2021, 10:02 AM ? ?Clinical Narrative:    ?MD says not ready today. Hope to start insurance auth for SNF rehab stay tomorrow for [facility] (spouse changed mind to this facility). PT/OT will need to resee. CM will update therapies. [place] will have a bed on Monday.  ?TOC following. ? ? ?Expected Discharge Plan: [place] ?Barriers to Discharge: Continued Medical Work up ? ?Expected Discharge Plan and Services ?Expected Discharge Plan: [place] ?  ?Discharge Planning Services: CM Consult ?Post Acute Care Choice: [place] ?Living arrangements for the past 2 months: [place] ?                ?  ?  ?  ?  ?  ?  ?  ?  ?  ?  ? ? ?Social Determinants of Health (SDOH) Interventions ?  ? ?Readmission Risk Interventions ?   ? View : No data to display.  ?  ?  ?  ? ? ?

## 2021-12-26 NOTE — Progress Notes (Signed)
? Stacy Diaz  XBD:[id number] DOB: [birth year] DOA: 12/22/2021 ?PCP: Isaac Bliss, Rayford Halsted, MD   ? ?Brief Narrative:  ?71yo with a history of premature CVD and prior stroke, meningioma status post resection 1999 with spastic paraplegia requiring wheelchair, systolic CHF with a EF 83-41%, chronic pain, seizure disorder, breast cancer status postlumpectomy and radiation, and HTN who awoke the morning of her admission unable to speak with possible left arm weakness.  Code stroke was initiated upon ER arrival but upon evaluation neurology felt her symptoms most likely represented acute metabolic encephalopathy due to UTI. ? ?Consultants:  ?Neurology ? ?Code Status: FULL CODE ? ?DVT prophylaxis: ?Lovenox ? ?Interim Hx: ?The patient experienced a repeat of her neurologic symptoms again yesterday evening, though much less pronounced than previously.  This time she experienced primarily a right gaze preference and numbness of the face and tongue.  She was dosed with low-dose Ativan which appears to have helped arrest her symptoms.  Later in the evening she experienced a few episodes of staring off and being unresponsive with some associated stiffness lasting only for a few seconds with approximately 15 minutes of confusion afterwards.  Her husband has previously described similar episodes at home that at times occur back to back for many days before stopping.  Neurology is further titrating her seizure medications, adding Keppra yesterday.  At the time of my visit she is fully back to her baseline alert conversant and quite pleasant.  She denies any new complaints today.  She tells me she remains very motivated to begin rehab. ? ?Assessment & Plan: ? ?Seizure disorder ?careful medication titration/adjustment being carried out by Neurology -Vimpat load continues, with Keppra now added as well, with ongoing weaning of Trileptal -no recurrence of symptoms thus far today ? ?Acute metabolic encephalopathy ?CT head showed  old encephalomalacia superior cerebrum, chronic ischemic microangiopathy, old craniectomy - EEG without acute findings - Neurology felt likely not stroke but rather generalized encephalopathy from infection in setting of poor cognitive reserve - with recrudescence of symptoms 12/24/21 stroke was ruled out again, and frequent seizure activity appears to clearly be the etiology - seizure medicines being adjusted by Neurology -monitor again overnight as an inpatient ? ?Severe sepsis due to E. coli and Proteus UTI ?Presented with tachycardia, tachypnea, leukocytosis and decreased mentation, with lactate 2.4 - treated initially with Rocephin to which both organisms have proven to be sensitive - transitioned to Keflex to complete 7 full days of treatment -will complete her course of antibiotic therapy 5/13 ? ?Hyperkalemia ?K 5.5 on admission, resolved with fluids ? ?Transaminitis ?Likely septic injury or fatty liver -now resolved with LFTs normal ? ?Hypocalcemia ?ionized CA is mildly low at 4.1 -continue calcium replacement ? ?Chronic systolic CHF  ?Remains euvolemic on exam- not on Lasix at home - continue BB, lisinopril -net negative approximately 6 L since admission ? ?Polycythemia ?Resolved with fluids -likely simple dehydration ? ?Chronic pain ?Continue home oxycodone, baclofen -well-controlled at present ? ?CAD / Cerebrovascular disease ?Continue atorvastatin, Plavix, lisinopril, metoprolol ? ?Chronic hyponatremia ?Stable with sodium range 124 -133 ? ?Chronic paraplegia  ?S/p meningioma resection in 1999 ? ?Essential hypertension ?BP currently well controlled - continue lisinopril, metoprolol ? ?Hyperlipidemia ?Continue statin  ? ?Family Communication:  ?Disposition: Medically the patient appears stable at present, but we will plan to monitor her another 24 hours to assure that her seizure activity is controlled, with ultimate plan being discharged to SNF for much-needed rehab with hopes of restoring her to her  prior  level of mobility  ? ? ?Objective: ?Blood pressure 121/62, pulse (!) 57, temperature 97.7 ?F (36.5 ?C), temperature source Oral, resp. rate 16, height '5\' 5"'$  (1.651 m), weight 76.7 kg, SpO2 100 %. ? ?Intake/Output Summary (Last 24 hours) at 12/26/2021 0745 ?Last data filed at 12/26/2021 0335 ?Gross per 24 hour  ?Intake --  ?Output 2625 ml  ?Net -2625 ml  ? ? ?Filed Weights  ? 12/22/21 0220 12/23/21 0500 12/24/21 0500  ?Weight: 79.4 kg 74.6 kg 76.7 kg  ? ? ?Examination: ?General: No acute respiratory distress -alert and conversant ?Lungs: Clear to auscultation bilaterally without wheezing ?Cardiovascular: RRR without murmur or rub ?Abdomen: NT/ND, soft, BS positive ?Extremities: No significant edema BLE ?Neuro: at baseline - no cranial nerve deficits, limited fine motor movement of left hand but nearly full ROM of left arm -sitting up in a chair independently at the time of my exam ? ?CBC: ?Recent Labs  ?Lab 12/22/21 ?0200 12/22/21 ?0205 12/23/21 ?0158 12/24/21 ?0306  ?WBC 12.4*  --  14.8* 10.0  ?NEUTROABS 6.0  --   --   --   ?HGB 16.4* 16.3* 14.4 13.9  ?HCT 46.5* 48.0* 41.3 39.5  ?MCV 90.3  --  88.1 88.4  ?PLT 294  --  280 227  ? ? ?Basic Metabolic Panel: ?Recent Labs  ?Lab 12/22/21 ?7253 12/23/21 ?0158 12/24/21 ?6644 12/25/21 ?0326  ?NA 133* 126* 126* 130*  ?K 3.8 3.6 4.2 4.2  ?CL 99 96* 96* 97*  ?CO2 20* 19* 19* 22  ?GLUCOSE 144* 130* 123* 116*  ?BUN '9 11 13 10  '$ ?CREATININE 0.63 0.69 0.62 0.55  ?CALCIUM 9.4 9.1 8.7* 8.9  ?MG 1.8  --   --  2.0  ? ? ?GFR: ?Estimated Creatinine Clearance: 67 mL/min (by C-G formula based on SCr of 0.55 mg/dL). ? ?Liver Function Tests: ?Recent Labs  ?Lab 12/22/21 ?0200 12/23/21 ?0158 12/24/21 ?0306  ?AST 45* 35 35  ?ALT '25 24 26  '$ ?ALKPHOS 53 45 43  ?BILITOT 0.8 0.9 0.8  ?PROT 7.0 6.7 6.2*  ?ALBUMIN 4.4 3.9 3.7  ? ? ?Scheduled Meds: ? atorvastatin  10 mg Oral q1800  ? baclofen  20 mg Oral QID  ? calcium carbonate  1 tablet Oral BID WC  ? cephALEXin  500 mg Oral Q12H  ? clopidogrel  75 mg  Oral Daily  ? diazepam  5 mg Oral Daily  ? enoxaparin (LOVENOX) injection  40 mg Subcutaneous Q24H  ? lacosamide  100 mg Oral BID  ? levETIRAcetam  500 mg Oral BID  ? lisinopril  40 mg Oral Daily  ? liver oil-zinc oxide  1 application. Topical QHS  ? melatonin  5 mg Oral QHS  ? metoprolol tartrate  100 mg Oral BID  ? OXcarbazepine  300 mg Oral BID  ? Followed by  ? OXcarbazepine  150 mg Oral BID  ? Followed by  ? [START ON 12/28/2021] OXcarbazepine  150 mg Oral Daily  ? polyethylene glycol  17 g Oral Weekly  ? polyvinyl alcohol  1 drop Both Eyes Daily  ? ? ? LOS: 4 days  ? ?Cherene Altes, MD ?[facility] ?Office  [phone] ?Pager - Text Page per Shea Evans ? ?If 7PM-7AM, please contact night-coverage per Amion ?12/26/2021, 7:45 AM ? ? ? ? ?

## 2021-12-26 NOTE — Progress Notes (Signed)
Neurology progress note ? ?S: Patient seen at bedside. She has been having episodes of starring off and unresponsive. She is aware during these episodes. No warning symptoms. Theses are very subtle. She is stiff during these episodes. She is confused for 15 misn after theses episodes and then is back to her baseline. She has had atleast 2 events one of which was earlier in the evening and the second one was close to midnight. She has had mild tiredness on higher doses of Keppra, will do Keppra '500mg'$  BID for now. ? ?O: ? ?Vitals:  ? 12/25/21 2106 12/25/21 2325  ?BP: (!) 102/56 122/61  ?Pulse: 64 61  ?Resp: 15 20  ?Temp:  97.7 ?F (36.5 ?C)  ?SpO2: 97% 96%  ? ?Physical Exam ?Gen: A&Ox4, NAD ?Resp: CTAB, normal work of breathing ?CV: RRR, extremities appear well-perfused. ?  ?Neuro: ?*MS: A&O x4. Follows multi-step commands ?*Speech: mild dysarthria, mild WFD ?*CN: PERRL, blinks to threat bilat, EOMI, L UMN facial droop, hearing intact to voice ?*Motor: RUE 5/5 no drift, anti-gravity BUE, movement BLE but none against gravity ?*Sensory: SILT. L hemineglect is improved ?*Reflexes: 2+ brisk on L, toes mute ?*Coordination, gait: UTA ? ?A/P: A/P: This is a patient v well known to [place] with hx multiple prior strokes as well as meningioma resection with baseline L hemiparesis and recurrent episodes aphasia/AMS and worsening of chronic LUE weakness c/f focal seizures. She is f/b Dr. Leonie Man. She is uncontrolled on trileptal (current dose 300/600). Given her poor control on this medication and the fact that there is no IV form available for use when she comes in with breakthrough seizures, she was switched to Vimpat. She is continuing to have episodes of starring off and has had 2 so far overnight. ? ?- S/p vimpat load; vimpat increased to '100mg'$  BID today and still having persistent episodes. ?- Decrease trileptal to '300mg'$  bid x2 days f/b '150mg'$  bid x2 days f/b '150mg'$  daily x2 days then stop ?- Keppra load of '1000mg'$  once followed by  '500mg'$  BID. ?- Ativan 1-2 mg for seizures lasting more than 3 mins. ?- No further stroke workup indicated at this time ?- F/u after discharge with Dr. Leonie Man as scheduled ? ?

## 2021-12-27 DIAGNOSIS — R6520 Severe sepsis without septic shock: Secondary | ICD-10-CM | POA: Diagnosis not present

## 2021-12-27 DIAGNOSIS — A419 Sepsis, unspecified organism: Secondary | ICD-10-CM | POA: Diagnosis not present

## 2021-12-27 LAB — CULTURE, BLOOD (ROUTINE X 2)
Culture: NO GROWTH
Culture: NO GROWTH
Special Requests: ADEQUATE
Special Requests: ADEQUATE

## 2021-12-27 MED ORDER — OXCARBAZEPINE 150 MG PO TABS
150.0000 mg | ORAL_TABLET | Freq: Every day | ORAL | Status: DC
  Administered 2021-12-29: 150 mg via ORAL
  Filled 2021-12-27: qty 1

## 2021-12-27 MED ORDER — OXCARBAZEPINE 150 MG PO TABS
150.0000 mg | ORAL_TABLET | Freq: Two times a day (BID) | ORAL | Status: AC
  Administered 2021-12-27 – 2021-12-28 (×4): 150 mg via ORAL
  Filled 2021-12-27 (×4): qty 1

## 2021-12-27 NOTE — Progress Notes (Signed)
? Stacy Diaz  MPN:[id number] DOB: 1951/01/09 DOA: 12/22/2021 ?PCP: Isaac Bliss, Rayford Halsted, MD   ? ?Brief Narrative:  ?71yo with a history of premature CVD and prior stroke, meningioma status post resection 1999 with spastic paraplegia requiring wheelchair, systolic CHF with a EF 00-86%, chronic pain, seizure disorder, breast cancer status postlumpectomy and radiation, and HTN who awoke the morning of her admission unable to speak with possible left arm weakness.  Code stroke was initiated upon ER arrival but upon evaluation neurology felt her symptoms most likely represented acute metabolic encephalopathy due to UTI. ? ?Consultants:  ?Neurology ? ?Code Status: FULL CODE ? ?DVT prophylaxis: ?Lovenox ? ?Interim Hx: ?No acute events reported overnight.  Afebrile.  Vital signs stable.  Patient is in good spirits and has no new complaints.  I spoke to her and her husband at bedside at length. ? ?Assessment & Plan: ? ?Seizure disorder ?careful medication titration/adjustment carried out by Neurology -Vimpat and Keppra, with ongoing weaning of Trileptal -no repeat seizure like activity over last 24 hours ? ?Acute metabolic encephalopathy ?CT head showed old encephalomalacia superior cerebrum, chronic ischemic microangiopathy, old craniectomy - EEG without acute findings - Neurology felt likely not stroke but rather generalized encephalopathy from infection in setting of poor cognitive reserve - with recrudescence of symptoms 12/24/21 stroke was ruled out again, and frequent seizure activity appears to clearly be the etiology - seizure medicines adjusted by Neurology -at this point the patient appears to have stabilized ? ?Severe sepsis due to E. coli and Proteus UTI ?Presented with tachycardia, tachypnea, leukocytosis and decreased mentation, with lactate 2.4 - treated initially with Rocephin to which both organisms have proven to be sensitive - transitioned to Keflex to complete 7 full days of treatment - will  complete her course of antibiotic therapy after her last dose today ? ?Hyperkalemia ?K 5.5 on admission, resolved with fluids ? ?Transaminitis ?Likely septic injury or fatty liver -now resolved with LFTs normal ? ?Hypocalcemia ?ionized CA is mildly low at 4.1 -continue calcium replacement ? ?Chronic systolic CHF  ?Remains euvolemic on exam- not on Lasix at home - continue BB, lisinopril -net negative approximately 6.6 L since admission ? ?Polycythemia ?Resolved with fluids -likely simple dehydration ? ?Chronic pain ?Continue home oxycodone, baclofen -well-controlled at present ? ?CAD / Cerebrovascular disease ?Continue atorvastatin, Plavix, lisinopril, metoprolol ? ?Chronic hyponatremia ?Stable with sodium range 124 -133 ? ?Chronic paraplegia  ?S/p meningioma resection in 1999 with resultant encephalomalacia ? ?Essential hypertension ?BP currently well controlled - continue lisinopril, metoprolol ? ?Hyperlipidemia ?Continue statin  ? ?Family Communication: Spoke with husband at bedside at length ?Disposition: The patient is now medically stable for discharge to SNF for a rehab stay -reportedly her bed will be available 12/29/2021 ? ? ?Objective: ?Blood pressure (!) 149/80, pulse 61, temperature 98.1 ?F (36.7 ?C), temperature source Oral, resp. rate 20, height '5\' 5"'$  (1.651 m), weight 76.7 kg, SpO2 96 %. ? ?Intake/Output Summary (Last 24 hours) at 12/27/2021 0745 ?Last data filed at 12/27/2021 0600 ?Gross per 24 hour  ?Intake 960 ml  ?Output 2050 ml  ?Net -1090 ml  ? ? ?Filed Weights  ? 12/22/21 0220 12/23/21 0500 12/24/21 0500  ?Weight: 79.4 kg 74.6 kg 76.7 kg  ? ? ?Examination: ?General: No acute respiratory distress ?Lungs: Clear to auscultation bilaterally  ?Cardiovascular: RRR without murmur  ?Abdomen: NT/ND, soft, BS positive ?Extremities: No significant edema BLE ?Neuro: at baseline - no cranial nerve deficits, limited fine motor movement of left hand but  nearly full ROM of left arm  ? ?CBC: ?Recent Labs  ?Lab  12/22/21 ?0200 12/22/21 ?0205 12/23/21 ?0158 12/24/21 ?0306  ?WBC 12.4*  --  14.8* 10.0  ?NEUTROABS 6.0  --   --   --   ?HGB 16.4* 16.3* 14.4 13.9  ?HCT 46.5* 48.0* 41.3 39.5  ?MCV 90.3  --  88.1 88.4  ?PLT 294  --  280 227  ? ? ?Basic Metabolic Panel: ?Recent Labs  ?Lab 12/22/21 ?3149 12/23/21 ?0158 12/24/21 ?7026 12/25/21 ?0326  ?NA 133* 126* 126* 130*  ?K 3.8 3.6 4.2 4.2  ?CL 99 96* 96* 97*  ?CO2 20* 19* 19* 22  ?GLUCOSE 144* 130* 123* 116*  ?BUN '9 11 13 10  '$ ?CREATININE 0.63 0.69 0.62 0.55  ?CALCIUM 9.4 9.1 8.7* 8.9  ?MG 1.8  --   --  2.0  ? ? ?GFR: ?Estimated Creatinine Clearance: 67 mL/min (by C-G formula based on SCr of 0.55 mg/dL). ? ?Liver Function Tests: ?Recent Labs  ?Lab 12/22/21 ?0200 12/23/21 ?0158 12/24/21 ?0306  ?AST 45* 35 35  ?ALT '25 24 26  '$ ?ALKPHOS 53 45 43  ?BILITOT 0.8 0.9 0.8  ?PROT 7.0 6.7 6.2*  ?ALBUMIN 4.4 3.9 3.7  ? ? ?Scheduled Meds: ? atorvastatin  10 mg Oral q1800  ? baclofen  20 mg Oral QID  ? calcium carbonate  1 tablet Oral BID WC  ? cephALEXin  500 mg Oral Q12H  ? clopidogrel  75 mg Oral Daily  ? diazepam  5 mg Oral Daily  ? enoxaparin (LOVENOX) injection  40 mg Subcutaneous Q24H  ? lacosamide  100 mg Oral BID  ? levETIRAcetam  500 mg Oral BID  ? lisinopril  20 mg Oral Daily  ? liver oil-zinc oxide  1 application. Topical QHS  ? melatonin  5 mg Oral QHS  ? metoprolol tartrate  100 mg Oral BID  ? OXcarbazepine  150 mg Oral BID  ? Followed by  ? [START ON 12/28/2021] OXcarbazepine  150 mg Oral Daily  ? polyethylene glycol  17 g Oral Weekly  ? polyvinyl alcohol  1 drop Both Eyes Daily  ? ? ? LOS: 5 days  ? ?Cherene Altes, MD ?[facility] ?Office  [phone] ?Pager - Text Page per Shea Evans ? ?If 7PM-7AM, please contact night-coverage per Amion ?12/27/2021, 7:45 AM ? ? ? ? ?

## 2021-12-28 DIAGNOSIS — R6520 Severe sepsis without septic shock: Secondary | ICD-10-CM | POA: Diagnosis not present

## 2021-12-28 DIAGNOSIS — A419 Sepsis, unspecified organism: Secondary | ICD-10-CM | POA: Diagnosis not present

## 2021-12-28 LAB — CBC
HCT: 37.5 % (ref 36.0–46.0)
Hemoglobin: 13 g/dL (ref 12.0–15.0)
MCH: 31.7 pg (ref 26.0–34.0)
MCHC: 34.7 g/dL (ref 30.0–36.0)
MCV: 91.5 fL (ref 80.0–100.0)
Platelets: 284 10*3/uL (ref 150–400)
RBC: 4.1 MIL/uL (ref 3.87–5.11)
RDW: 12.8 % (ref 11.5–15.5)
WBC: 8.9 10*3/uL (ref 4.0–10.5)
nRBC: 0 % (ref 0.0–0.2)

## 2021-12-28 LAB — BASIC METABOLIC PANEL
Anion gap: 8 (ref 5–15)
BUN: 13 mg/dL (ref 8–23)
CO2: 24 mmol/L (ref 22–32)
Calcium: 9.1 mg/dL (ref 8.9–10.3)
Chloride: 101 mmol/L (ref 98–111)
Creatinine, Ser: 0.63 mg/dL (ref 0.44–1.00)
GFR, Estimated: >60 mL/min (ref >60)
Glucose, Bld: 123 mg/dL — ABNORMAL HIGH (ref 70–99)
Potassium: 4.2 mmol/L (ref 3.5–5.1)
Sodium: 133 mmol/L — ABNORMAL LOW (ref 135–145)

## 2021-12-28 LAB — MAGNESIUM: Magnesium: 2 mg/dL (ref 1.7–2.4)

## 2021-12-28 NOTE — Progress Notes (Signed)
Per MD, pt likely ready for dc to [facility] on Monday. Auth request submitted to [facility], spoke to [employer]. Anticipate insurance determination by tomorrow. ? ?SW will follow.  ? ?Wandra Feinstein, MSW, LCSW ?[phone] (coverage) ? ? ?

## 2021-12-28 NOTE — Progress Notes (Signed)
Physical Therapy Treatment ?Patient Details ?Name: Stacy Diaz ?MRN: [id number] ?DOB: [birth year] ?Today's Date: 12/28/2021 ? ? ?History of Present Illness Stacy Diaz is a 71 y.o. female who present to ED with confusion, inability to speak and L UE weakness. CT negative for stroke. Pt found to have UTI.  PMH: hypertension, hyperlipidemia, CVA, CAD, HFrEF,  meningioma  in 1998 s/p resection with residual paraplegia, breast cancer, seizure disorder, chronic pain, and hyponatremia ? ?  ?PT Comments  ? ? Patient seen for session focusing on bed mobility while incorporating use of upper trunk and UEs to assist. Progressed to sitting on EOB (after pt's wet linens changed and pt cleaned), where pt required up to mod assist to maintain sitting balance with instructional cues for RUE placement and how to activate torso.  ?   ?Recommendations for follow up therapy are one component of a multi-disciplinary discharge planning process, led by the attending physician.  Recommendations may be updated based on patient status, additional functional criteria and insurance authorization. ? ?Follow Up Recommendations ? Skilled nursing-short term rehab (<3 hours/day) ?  ?  ?Assistance Recommended at Discharge Frequent or constant Supervision/Assistance  ?Patient can return home with the following Assistance with cooking/housework;Direct supervision/assist for medications management;Direct supervision/assist for financial management;Assist for transportation;Help with stairs or ramp for entrance;Two people to help with walking and/or transfers;Two people to help with bathing/dressing/bathroom;Assistance with feeding ?  ?Equipment Recommendations ? None recommended by PT  ?  ?Recommendations for Other Services   ? ? ?  ?Precautions / Restrictions Precautions ?Precautions: Fall ?Precaution Comments: paraplegic ?Restrictions ?Weight Bearing Restrictions: No  ?  ? ?Mobility ? Bed Mobility ?Overal bed mobility: Needs Assistance ?Bed  Mobility: Rolling, Sidelying to Sit, Sit to Sidelying ?Rolling: Max assist (to left; pt turning head and reaching with RUE) ?Sidelying to sit: Total assist, +2 for physical assistance, HOB elevated ?  ?  ?Sit to sidelying: Max assist, +2 for physical assistance ?General bed mobility comments: rolled onto rt side; assisted legs over EOB and then elevated HOB ~40 degrees with pt then cued to push up to sitting with RUE; +effort, but very weak even in RUE; return to sidelying she did better using RUE to lower her torso onto her side, total assist for legs up ?  ? ?Transfers ?  ?  ?  ?  ?  ?  ?  ?  ?  ?General transfer comment: pt wet at beginning of session; practiced rolling as providing linen change; not enough time to also work on transfer this session ?  ? ?Ambulation/Gait ?  ?  ?  ?  ?  ?  ?  ?  ? ? ?Stairs ?  ?  ?  ?  ?  ? ? ?Wheelchair Mobility ?  ? ?Modified Rankin (Stroke Patients Only) ?Modified Rankin (Stroke Patients Only) ?Pre-Morbid Rankin Score: Severe disability ?Modified Rankin: Severe disability ? ? ?  ?Balance Overall balance assessment: Needs assistance ?Sitting-balance support: Feet supported ?Sitting balance-Leahy Scale: Poor ?Sitting balance - Comments: Up to mod A for sitting balance. Cues for placing RUE for best assist and pt with difficulty activating trunk to bring trunk/shoulders forward from posterior lean ?Postural control: Posterior lean ?  ?  ?  ?  ?  ?  ?  ?  ?  ?  ?  ?  ?  ?  ?  ? ?  ?Cognition Arousal/Alertness: Awake/alert ?Behavior During Therapy: [facility] for tasks assessed/performed ?Overall Cognitive Status: Impaired/Different from baseline ?Area  of Impairment: Awareness, Problem solving ?  ?  ?  ?  ?  ?  ?  ?  ?  ?Current Attention Level: Sustained ?  ?  ?Safety/Judgement: Decreased awareness of deficits ?Awareness: Emergent ?Problem Solving: Slow processing, Decreased initiation, Difficulty sequencing, Requires verbal cues, Requires tactile cues ?General Comments: Pt very motivated  this session to work with therapy this session. Requires incr time to process request (especially for LUE) ?  ?  ? ?  ?Exercises Other Exercises ?Other Exercises: Segmental rolling with first upper body and each UE. Pt with ability to horizontally adduct LUE and "push me away" while looking rt and trying to bring shoulder forward to initiate roll. Slightly stronger with this motion on her right, but remains weak. Repeat x 3 each side ?Other Exercises: Attempt to activate lower torso for improving sitting balance using hooklying and bringing knees to one side, assisting to elevate hip toward rolling and then asking pt to hold the position and not roll backwards. Repeated to each side x 3 ? ?  ?General Comments   ?  ?  ? ?Pertinent Vitals/Pain Pain Assessment ?Pain Assessment: No/denies pain  ? ? ?Home Living   ?  ?  ?  ?  ?  ?  ?  ?  ?  ?   ?  ?Prior Function    ?  ?  ?   ? ?PT Goals (current goals can now be found in the care plan section) Acute Rehab PT Goals ?Patient Stated Goal: to get better ?PT Goal Formulation: With patient/family ?Time For Goal Achievement: 01/06/22 ?Potential to Achieve Goals: Fair ?Progress towards PT goals: Progressing toward goals ? ?  ?Frequency ? ? ? Min 3X/week ? ? ? ?  ?PT Plan Current plan remains appropriate  ? ? ?Co-evaluation   ?  ?  ?  ?  ? ?  ?AM-PAC PT "6 Clicks" Mobility   ?Outcome Measure ? Help needed turning from your back to your side while in a flat bed without using bedrails?: A Lot ?Help needed moving from lying on your back to sitting on the side of a flat bed without using bedrails?: A Lot ?Help needed moving to and from a bed to a chair (including a wheelchair)?: Total ?Help needed standing up from a chair using your arms (e.g., wheelchair or bedside chair)?: Total ?Help needed to walk in hospital room?: Total ?Help needed climbing 3-5 steps with a railing? : Total ?6 Click Score: 8 ? ?  ?End of Session   ?Activity Tolerance: Patient tolerated treatment well ?Patient  left: with call bell/phone within reach;with family/visitor present;in bed;with bed alarm set ?  ?PT Visit Diagnosis: Unsteadiness on feet (R26.81);Muscle weakness (generalized) (M62.81);Other abnormalities of gait and mobility (R26.89) ?  ? ? ?Time: 1194-1740 ?PT Time Calculation (min) (ACUTE ONLY): 36 min ? ?Charges:  $Therapeutic Activity: 8-22 mins ?$Neuromuscular Re-education: 8-22 mins          ?          ? ? ?Arby Barrette, PT ?Acute Rehabilitation Services  ?Pager [phone] ?Office [phone] ? ? ? ?Stacy Diaz ?12/28/2021, 12:03 PM ? ?

## 2021-12-28 NOTE — Progress Notes (Signed)
? Stacy Diaz  IZT:[id number] DOB: [birth year] DOA: 12/22/2021 ?PCP: Isaac Bliss, Rayford Halsted, MD   ? ?Brief Narrative:  ?71yo with a history of premature CVD and prior stroke, meningioma status post resection 1999 with spastic paraplegia requiring wheelchair, systolic CHF with a EF 38-25%, chronic pain, seizure disorder, breast cancer status postlumpectomy and radiation, and HTN who awoke the morning of her admission unable to speak with possible left arm weakness.  Code stroke was initiated upon ER arrival but upon evaluation neurology felt her symptoms most likely represented acute metabolic encephalopathy due to UTI. ? ?Consultants:  ?Neurology ? ?Code Status: FULL CODE ? ?DVT prophylaxis: ?Lovenox ? ?Interim Hx: ?Afebrile.  Vital signs stable.  No acute events overnight.  Electrolytes are balanced and hemoglobin is stable.  Resting comfortably in bed.  No new complaints.  Had a good night last night with no further seizure activity and no other issues.  Participating with physical therapy at the time of my visit. ? ?Assessment & Plan: ? ?Seizure disorder ?careful medication titration/adjustment carried out by Neurology - Vimpat and Keppra being administered, with ongoing weaning of Trileptal - no repeat seizure like activity over last 48 hours ? ?Acute metabolic encephalopathy ?CT head showed old encephalomalacia superior cerebrum, chronic ischemic microangiopathy, old craniectomy - EEG without acute findings - Neurology felt likely not stroke but rather generalized encephalopathy from infection in setting of poor cognitive reserve - with recrudescence of symptoms 12/24/21 stroke was ruled out again, and frequent seizure activity appears to clearly be the etiology - seizure medicines adjusted by Neurology -at this point the patient appears to have stabilized ? ?Severe sepsis due to E. coli and Proteus UTI ?Presented with tachycardia, tachypnea, leukocytosis and decreased mentation, with lactate 2.4 - treated  initially with Rocephin to which both organisms have proven to be sensitive - transitioned to Keflex to complete 7 full days of treatment -has completed a course of antibiotic therapy ? ?Hyperkalemia ?K 5.5 on admission, resolved with volume resuscitation ? ?Transaminitis ?Likely septic injury or fatty liver -now resolved with LFTs normal ? ?Hypocalcemia ?ionized CA is mildly low at 4.1 -continue calcium replacement -recheck calcium in 4-6 weeks as outpatient ? ?Chronic systolic CHF  ?Remains euvolemic on exam- not on Lasix at home - continue BB, lisinopril - net negative approximately 9L since admission ? ?Polycythemia ?Resolved with fluids -likely simple dehydration ? ?Chronic pain ?Continue home oxycodone, baclofen -well-controlled at present ? ?CAD / Cerebrovascular disease ?Continue atorvastatin, Plavix, lisinopril, metoprolol ? ?Chronic hyponatremia ?Stable with sodium range 124 -133 ? ?Chronic paraplegia  ?S/p meningioma resection in 1999 with resultant encephalomalacia ? ?Essential hypertension ?BP reasonably controlled controlled - continue lisinopril, metoprolol -follow without change today ? ?Hyperlipidemia ?Continue statin  ? ?Family Communication: Spoke with husband at bedside ?Disposition: The patient is now medically stable for discharge to SNF for a rehab stay - reportedly her bed will be available 12/29/2021 ? ? ?Objective: ?Blood pressure (!) 160/78, pulse 68, temperature 97.9 ?F (36.6 ?C), temperature source Axillary, resp. rate 18, height '5\' 5"'$  (1.651 m), weight 76.7 kg, SpO2 96 %. ? ?Intake/Output Summary (Last 24 hours) at 12/28/2021 0849 ?Last data filed at 12/28/2021 0846 ?Gross per 24 hour  ?Intake 480 ml  ?Output 2925 ml  ?Net -2445 ml  ? ? ?Filed Weights  ? 12/22/21 0220 12/23/21 0500 12/24/21 0500  ?Weight: 79.4 kg 74.6 kg 76.7 kg  ? ? ?Examination: ?General: No acute respiratory distress ?Lungs: Clear to auscultation bilaterally w/o wheezing  ?  Cardiovascular: RRR without murmur  ?Abdomen:  NT/ND, soft, BS positive ?Extremities: No significant edema B lower extremities ?Neuro: at baseline - no cranial nerve deficits, limited fine motor movement of left hand but discoordinated use of left upper extremity persists as per reported baseline ? ?CBC: ?Recent Labs  ?Lab 12/22/21 ?0200 12/22/21 ?0205 12/23/21 ?0158 12/24/21 ?0981 12/28/21 ?0138  ?WBC 12.4*  --  14.8* 10.0 8.9  ?NEUTROABS 6.0  --   --   --   --   ?HGB 16.4*   < > 14.4 13.9 13.0  ?HCT 46.5*   < > 41.3 39.5 37.5  ?MCV 90.3  --  88.1 88.4 91.5  ?PLT 294  --  280 227 284  ? < > = values in this interval not displayed.  ? ? ?Basic Metabolic Panel: ?Recent Labs  ?Lab 12/22/21 ?1914 12/23/21 ?0158 12/24/21 ?7829 12/25/21 ?5621 12/28/21 ?0138  ?NA 133*   < > 126* 130* 133*  ?K 3.8   < > 4.2 4.2 4.2  ?CL 99   < > 96* 97* 101  ?CO2 20*   < > 19* 22 24  ?GLUCOSE 144*   < > 123* 116* 123*  ?BUN 9   < > '13 10 13  '$ ?CREATININE 0.63   < > 0.62 0.55 0.63  ?CALCIUM 9.4   < > 8.7* 8.9 9.1  ?MG 1.8  --   --  2.0 2.0  ? < > = values in this interval not displayed.  ? ? ?GFR: ?Estimated Creatinine Clearance: 67 mL/min (by C-G formula based on SCr of 0.63 mg/dL). ? ?Liver Function Tests: ?Recent Labs  ?Lab 12/22/21 ?0200 12/23/21 ?0158 12/24/21 ?0306  ?AST 45* 35 35  ?ALT '25 24 26  '$ ?ALKPHOS 53 45 43  ?BILITOT 0.8 0.9 0.8  ?PROT 7.0 6.7 6.2*  ?ALBUMIN 4.4 3.9 3.7  ? ? ?Scheduled Meds: ? atorvastatin  10 mg Oral q1800  ? baclofen  20 mg Oral QID  ? calcium carbonate  1 tablet Oral BID WC  ? clopidogrel  75 mg Oral Daily  ? diazepam  5 mg Oral Daily  ? enoxaparin (LOVENOX) injection  40 mg Subcutaneous Q24H  ? lacosamide  100 mg Oral BID  ? levETIRAcetam  500 mg Oral BID  ? lisinopril  20 mg Oral Daily  ? liver oil-zinc oxide  1 application. Topical QHS  ? melatonin  5 mg Oral QHS  ? metoprolol tartrate  100 mg Oral BID  ? OXcarbazepine  150 mg Oral BID  ? Followed by  ? [START ON 12/29/2021] OXcarbazepine  150 mg Oral Daily  ? polyethylene glycol  17 g Oral Weekly  ?  polyvinyl alcohol  1 drop Both Eyes Daily  ? ? ? LOS: 6 days  ? ?Cherene Altes, MD ?[facility] ?Office  [phone] ?Pager - Text Page per Shea Evans ? ?If 7PM-7AM, please contact night-coverage per Amion ?12/28/2021, 8:49 AM ? ? ? ? ?

## 2021-12-29 DIAGNOSIS — M2550 Pain in unspecified joint: Secondary | ICD-10-CM | POA: Diagnosis not present

## 2021-12-29 DIAGNOSIS — E559 Vitamin D deficiency, unspecified: Secondary | ICD-10-CM | POA: Diagnosis not present

## 2021-12-29 DIAGNOSIS — G40919 Epilepsy, unspecified, intractable, without status epilepticus: Secondary | ICD-10-CM | POA: Diagnosis not present

## 2021-12-29 DIAGNOSIS — G9341 Metabolic encephalopathy: Secondary | ICD-10-CM | POA: Diagnosis not present

## 2021-12-29 DIAGNOSIS — G9340 Encephalopathy, unspecified: Secondary | ICD-10-CM | POA: Diagnosis not present

## 2021-12-29 DIAGNOSIS — Z7401 Bed confinement status: Secondary | ICD-10-CM | POA: Diagnosis not present

## 2021-12-29 DIAGNOSIS — A419 Sepsis, unspecified organism: Secondary | ICD-10-CM | POA: Diagnosis not present

## 2021-12-29 DIAGNOSIS — G8220 Paraplegia, unspecified: Secondary | ICD-10-CM | POA: Diagnosis not present

## 2021-12-29 DIAGNOSIS — I2510 Atherosclerotic heart disease of native coronary artery without angina pectoris: Secondary | ICD-10-CM | POA: Diagnosis not present

## 2021-12-29 DIAGNOSIS — R41841 Cognitive communication deficit: Secondary | ICD-10-CM | POA: Diagnosis not present

## 2021-12-29 DIAGNOSIS — I5022 Chronic systolic (congestive) heart failure: Secondary | ICD-10-CM | POA: Diagnosis not present

## 2021-12-29 DIAGNOSIS — R6520 Severe sepsis without septic shock: Secondary | ICD-10-CM | POA: Diagnosis not present

## 2021-12-29 DIAGNOSIS — G4089 Other seizures: Secondary | ICD-10-CM | POA: Diagnosis not present

## 2021-12-29 DIAGNOSIS — M6281 Muscle weakness (generalized): Secondary | ICD-10-CM | POA: Diagnosis not present

## 2021-12-29 DIAGNOSIS — I69828 Other speech and language deficits following other cerebrovascular disease: Secondary | ICD-10-CM | POA: Diagnosis not present

## 2021-12-29 DIAGNOSIS — I10 Essential (primary) hypertension: Secondary | ICD-10-CM | POA: Diagnosis not present

## 2021-12-29 DIAGNOSIS — E119 Type 2 diabetes mellitus without complications: Secondary | ICD-10-CM | POA: Diagnosis not present

## 2021-12-29 DIAGNOSIS — M1990 Unspecified osteoarthritis, unspecified site: Secondary | ICD-10-CM | POA: Diagnosis not present

## 2021-12-29 DIAGNOSIS — R2681 Unsteadiness on feet: Secondary | ICD-10-CM | POA: Diagnosis not present

## 2021-12-29 DIAGNOSIS — Z79899 Other long term (current) drug therapy: Secondary | ICD-10-CM | POA: Diagnosis not present

## 2021-12-29 DIAGNOSIS — K5900 Constipation, unspecified: Secondary | ICD-10-CM | POA: Diagnosis not present

## 2021-12-29 DIAGNOSIS — E785 Hyperlipidemia, unspecified: Secondary | ICD-10-CM | POA: Diagnosis not present

## 2021-12-29 DIAGNOSIS — A5217 General paresis: Secondary | ICD-10-CM | POA: Diagnosis not present

## 2021-12-29 MED ORDER — ACETAMINOPHEN 325 MG PO TABS: 650.0000 mg | ORAL_TABLET | Freq: Four times a day (QID) | ORAL | Status: DC | PRN

## 2021-12-29 MED ORDER — OXYCODONE HCL 10 MG PO TABS: 10.0000 mg | ORAL_TABLET | Freq: Three times a day (TID) | ORAL | 0 refills | Status: DC | PRN

## 2021-12-29 MED ORDER — CALCIUM CARBONATE 1250 (500 CA) MG PO TABS
1.0000 | ORAL_TABLET | Freq: Two times a day (BID) | ORAL | Status: AC
Start: 2021-12-29 — End: ?

## 2021-12-29 MED ORDER — LACOSAMIDE 100 MG PO TABS: 100.0000 mg | ORAL_TABLET | Freq: Two times a day (BID) | ORAL | 0 refills | Status: DC

## 2021-12-29 MED ORDER — LACOSAMIDE 100 MG PO TABS: 100.0000 mg | ORAL_TABLET | Freq: Two times a day (BID) | ORAL | Status: DC

## 2021-12-29 MED ORDER — LEVETIRACETAM 500 MG PO TABS: 500.0000 mg | ORAL_TABLET | Freq: Two times a day (BID) | ORAL | Status: DC

## 2021-12-29 MED ORDER — OXCARBAZEPINE 150 MG PO TABS: 150.0000 mg | ORAL_TABLET | Freq: Every day | ORAL | 0 refills | Status: DC

## 2021-12-29 MED ORDER — DIAZEPAM 5 MG PO TABS: 5.0000 mg | ORAL_TABLET | Freq: Every day | ORAL | 0 refills | Status: DC

## 2021-12-29 NOTE — TOC Transition Note (Signed)
Transition of Care (TOC) - CM/SW Discharge Note ? ? ?Patient Details  ?Name: Stacy Diaz ?MRN: [id number] ?Date of Birth: 1950/12/17 ? ?Transition of Care (TOC) CM/SW Contact:  ?[name], RN ?Phone Number: ?12/29/2021, 10:37 AM ? ? ?Clinical Narrative:    ?Patient is discharging to [facility] today. Spouse is aware and in agreement. Bedside RN updated and d/c packet is at the desk.  ? ?Number for report: [phone] ? ? ?Final next level of care: [place] ?Barriers to Discharge: No Barriers Identified ? ? ?Patient Goals and CMS Choice ?  ?CMS Medicare.gov Compare Post Acute Care list provided to:: Patient ?Choice offered to / list presented to : Patient, Spouse, Adult Children ? ?Discharge Placement ?  ?           ?Patient chooses bed at: [facility] ?Patient to be transferred to facility by: [facility] ?Name of family member notified: spouse--David ?Patient and family notified of of transfer: 12/29/21 ? ?Discharge Plan and Services ?  ?Discharge Planning Services: CM Consult ?Post Acute Care Choice: [place]          ?  ?  ?  ?  ?  ?  ?  ?  ?  ?  ? ?Social Determinants of Health (SDOH) Interventions ?  ? ? ?Readmission Risk Interventions ?   ? View : No data to display.  ?  ?  ?  ? ? ? ? ? ?

## 2021-12-29 NOTE — Discharge Summary (Signed)
?DISCHARGE SUMMARY ? ?Stacy Diaz ? ?MR#: [id number] ? ?DOB:1951-08-05  ?Date of Admission: 12/22/2021 ?Date of Discharge: 12/29/2021 ? ?Attending Physician:Lanell Dubie Hennie Duos, MD ? ?Patient's POE:UMPNTIRWE Stacy Beals, MD ? ?Consults: Neurology ? ?Disposition: D/C to SNF for rehab stay  ? ?Follow-up Appts: ? Follow-up Information   ? ? Isaac Bliss, Rayford Halsted, MD Follow up.   ?Specialty: Internal Medicine ?Why: Schedule a follow up visit with your PCP within 10 days of your release from your SNF Rehab center. ?Contact information: ?[address] ?[place] [address] ?[phone] ? ? ?  ?  ? ? Garvin Fila, MD. Schedule an appointment as soon as possible for a visit in 1 month(s).   ?Specialties: Neurology, Radiology ?Contact information: ?[address] ?[address] ?[place] [address] ?[phone] ? ? ?  ?  ? ?  ?  ? ?  ? ? ?Tests Needing Follow-up: ?-recheck calcium in 4-6 weeks as outpatient ?-follow-up blood pressure with possible need to further titrate medications ? ?Discharge Diagnoses: ?Seizure disorder with acute breakthrough seizures ?Acute metabolic encephalopathy ?Severe sepsis due to E. coli and Proteus UTI ?Hyperkalemia ?Transaminitis ?Hypocalcemia ?Chronic systolic CHF  ?Polycythemia ?Chronic pain ?CAD / Cerebrovascular disease ?Chronic hyponatremia ?Chronic paraplegia  ?Essential hypertension ?Hyperlipidemia ? ?Initial presentation: ?71yo with a history of premature CVD and prior stroke, meningioma status post resection 1999 with spastic paraplegia requiring wheelchair, systolic CHF with a EF 82-50%, chronic pain, seizure disorder, breast cancer status post lumpectomy and radiation, and HTN who awoke the morning of her admission unable to speak with possible left arm weakness.  Code stroke was initiated upon ER arrival but upon evaluation neurology felt her symptoms most likely represented acute metabolic encephalopathy instead of stroke. ? ?Hospital Course: ? ?Seizure  disorder with acute breakthrough seizures ?careful medication titration/adjustment carried out by Neurology - Vimpat and Keppra being newly administered, with ongoing weaning of Trileptal - no repeat seizure like activity over last 48 hours of hospital stay -patient in good spirits and showing improving mobility with ongoing PT/OT ?  ?Acute metabolic encephalopathy ?CT head showed old encephalomalacia superior cerebrum, chronic ischemic microangiopathy, old craniectomy - EEG without acute findings - Neurology initially felt likely not stroke but rather generalized encephalopathy from infection in setting of poor cognitive reserve - with recrudescence of symptoms 12/24/21 stroke was ruled out again, and frequent seizure activity appears to clearly be the etiology - seizure medicines adjusted by Neurology -at this point the patient appears to have stabilized -with improved control of her seizure disorder and treatment of her UTI her mental status has returned to her baseline at the time of her discharge ?  ?Severe sepsis due to E. coli and Proteus UTI ?Presented with tachycardia, tachypnea, leukocytosis and decreased mentation, with lactate 2.4 - treated initially with Rocephin to which both organisms have proven to be sensitive - transitioned to Keflex to complete 7 full days of treatment -has completed a course of antibiotic therapy during her hospitalization ?  ?Hyperkalemia ?K 5.5 on admission, resolved with volume resuscitation ?  ?Transaminitis ?Likely septic injury or fatty liver - now resolved with LFTs normal at time of discharge ?  ?Hypocalcemia ?ionized CA is mildly low at 4.1 -continue calcium replacement -recheck calcium in 4-6 weeks as outpatient ?  ?Chronic systolic CHF  ?Remains euvolemic on exam- not on Lasix at home - continue BB, lisinopril ?  ?Polycythemia ?Resolved with fluids -likely simple dehydration ?  ?Chronic pain ?Continue home oxycodone, baclofen -well-controlled at  time of discharge ?   ?CAD / Cerebrovascular disease ?Continue atorvastatin, Plavix, lisinopril, metoprolol ?  ?Chronic hyponatremia ?Stable with sodium range 124 -133 ?  ?Chronic paraplegia  ?S/p meningioma resection in 1999 with resultant encephalomalacia ?  ?Essential hypertension ?BP reasonably controlled - continue lisinopril, metoprolol -follow in outpatient setting with possible need to further titrate medications ?  ?Hyperlipidemia ?Continue statin  ?  ? ?Allergies as of 12/29/2021   ?No Known Allergies ?  ? ?  ?Medication List  ?  ? ?STOP taking these medications   ? ?CALCIUM CARBONATE-VITAMIN D3 PO ?  ?oxyCODONE-acetaminophen 10-325 MG tablet ?Commonly known as: PERCOCET ?  ? ?  ? ?TAKE these medications   ? ?acetaminophen 325 MG tablet ?Commonly known as: TYLENOL ?Take 2 tablets (650 mg total) by mouth every 6 (six) hours as needed for mild pain (or Fever >/= 101). ?  ?ASPERCREME LIDOCAINE EX ?Apply 1 application topically daily as needed (muscle pain). ?  ?atorvastatin 10 MG tablet ?Commonly known as: LIPITOR ?Take 1 tablet (10 mg total) by mouth daily at 6 PM. ?  ?baclofen 20 MG tablet ?Commonly known as: LIORESAL ?TAKE ONE TABLET BY MOUTH FOUR TIMES DAILY ?  ?calcium carbonate 1250 (500 Ca) MG tablet ?Commonly known as: OS-CAL - dosed in mg of elemental calcium ?Take 1 tablet (500 mg of elemental calcium total) by mouth 2 (two) times daily with a meal. ?  ?carboxymethylcellulose 0.5 % Soln ?Commonly known as: REFRESH PLUS ?Place 1 drop into both eyes every morning. ?  ?clopidogrel 75 MG tablet ?Commonly known as: PLAVIX ?Take 1 tablet (75 mg total) by mouth daily. ?  ?diazepam 5 MG tablet ?Commonly known as: VALIUM ?Take 1 tablet (5 mg total) by mouth daily. ?  ?Lacosamide 100 MG Tabs ?Take 1 tablet (100 mg total) by mouth 2 (two) times daily. ?  ?levETIRAcetam 500 MG tablet ?Commonly known as: KEPPRA ?Take 1 tablet (500 mg total) by mouth 2 (two) times daily. ?  ?lisinopril 40 MG tablet ?Commonly known as: ZESTRIL ?Take 1  tablet (40 mg total) by mouth daily. ?  ?liver oil-zinc oxide 40 % ointment ?Commonly known as: DESITIN ?Apply 1 application topically at bedtime. Apply to buttocks ?  ?melatonin 5 MG Tabs ?Take 5 mg by mouth at bedtime. ?  ?metoprolol tartrate 100 MG tablet ?Commonly known as: LOPRESSOR ?Take 1 tablet (100 mg total) by mouth 2 (two) times daily. ?  ?multivitamin with minerals Tabs tablet ?Take 1 tablet by mouth every morning. ?  ?OXcarbazepine 150 MG tablet ?Commonly known as: TRILEPTAL ?Take 1 tablet (150 mg total) by mouth daily for 1 day. ?What changed:  ?medication strength ?how much to take ?how to take this ?when to take this ?additional instructions ?  ?Oxycodone HCl 10 MG Tabs ?Take 1 tablet (10 mg total) by mouth every 8 (eight) hours as needed for moderate pain or severe pain. ?  ?polyethylene glycol 17 g packet ?Commonly known as: MIRALAX / GLYCOLAX ?Take 17 g by mouth once a week. Mix with liquid and drink ?  ? ?  ? ? ?Day of Discharge ?BP (!) 154/85 (BP Location: Right Arm)   Pulse 61   Temp 98 ?F (36.7 ?C) (Oral)   Resp 18   Ht '5\' 5"'$  (1.651 m)   Wt 76.7 kg   SpO2 96%   BMI 28.14 kg/m?  ? ?Physical Exam: ?General: No acute respiratory distress ?Lungs: Clear to auscultation bilaterally without wheezes or crackles ?Cardiovascular: Regular rate  and rhythm without murmur gallop or rub normal S1 and S2 ?Abdomen: Nontender, nondistended, soft, bowel sounds positive, no rebound, no ascites, no appreciable mass ?Extremities: No significant cyanosis, clubbing, or edema bilateral lower extremities ?Neuro: No cranial nerve deficits, alert and oriented x4, left hand held in flexed/fist position, minimal motion of left upper extremity with some muscle contractions appreciable, full range of motion right upper extremity, very limited motion bilateral lower extremities as per baseline ? ?Basic Metabolic Panel: ?Recent Labs  ?Lab 12/23/21 ?0158 12/24/21 ?0306 12/25/21 ?0326 12/28/21 ?0138  ?NA 126* 126* 130*  133*  ?K 3.6 4.2 4.2 4.2  ?CL 96* 96* 97* 101  ?CO2 19* 19* 22 24  ?GLUCOSE 130* 123* 116* 123*  ?BUN '11 13 10 13  '$ ?CREATININE 0.69 0.62 0.55 0.63  ?CALCIUM 9.1 8.7* 8.9 9.1  ?MG  --   --  2.0 2.0  ? ? ?Liver Function T

## 2021-12-31 DIAGNOSIS — G9340 Encephalopathy, unspecified: Secondary | ICD-10-CM | POA: Diagnosis not present

## 2021-12-31 DIAGNOSIS — E785 Hyperlipidemia, unspecified: Secondary | ICD-10-CM | POA: Diagnosis not present

## 2021-12-31 DIAGNOSIS — I10 Essential (primary) hypertension: Secondary | ICD-10-CM | POA: Diagnosis not present

## 2021-12-31 DIAGNOSIS — G4089 Other seizures: Secondary | ICD-10-CM | POA: Diagnosis not present

## 2022-01-02 DIAGNOSIS — K5900 Constipation, unspecified: Secondary | ICD-10-CM | POA: Diagnosis not present

## 2022-01-02 DIAGNOSIS — M1990 Unspecified osteoarthritis, unspecified site: Secondary | ICD-10-CM | POA: Diagnosis not present

## 2022-01-05 DIAGNOSIS — E785 Hyperlipidemia, unspecified: Secondary | ICD-10-CM | POA: Diagnosis not present

## 2022-01-05 DIAGNOSIS — G8220 Paraplegia, unspecified: Secondary | ICD-10-CM | POA: Diagnosis not present

## 2022-01-05 DIAGNOSIS — I10 Essential (primary) hypertension: Secondary | ICD-10-CM | POA: Diagnosis not present

## 2022-01-05 DIAGNOSIS — I5022 Chronic systolic (congestive) heart failure: Secondary | ICD-10-CM | POA: Diagnosis not present

## 2022-01-06 DIAGNOSIS — E785 Hyperlipidemia, unspecified: Secondary | ICD-10-CM | POA: Diagnosis not present

## 2022-01-06 DIAGNOSIS — M1990 Unspecified osteoarthritis, unspecified site: Secondary | ICD-10-CM | POA: Diagnosis not present

## 2022-01-06 DIAGNOSIS — G4089 Other seizures: Secondary | ICD-10-CM | POA: Diagnosis not present

## 2022-01-06 DIAGNOSIS — I10 Essential (primary) hypertension: Secondary | ICD-10-CM | POA: Diagnosis not present

## 2022-01-09 DIAGNOSIS — I10 Essential (primary) hypertension: Secondary | ICD-10-CM | POA: Diagnosis not present

## 2022-01-09 DIAGNOSIS — G40919 Epilepsy, unspecified, intractable, without status epilepticus: Secondary | ICD-10-CM | POA: Diagnosis not present

## 2022-01-12 DIAGNOSIS — E785 Hyperlipidemia, unspecified: Secondary | ICD-10-CM | POA: Diagnosis not present

## 2022-01-12 DIAGNOSIS — E559 Vitamin D deficiency, unspecified: Secondary | ICD-10-CM | POA: Diagnosis not present

## 2022-01-12 DIAGNOSIS — G8220 Paraplegia, unspecified: Secondary | ICD-10-CM | POA: Diagnosis not present

## 2022-01-12 DIAGNOSIS — I10 Essential (primary) hypertension: Secondary | ICD-10-CM | POA: Diagnosis not present

## 2022-01-13 DIAGNOSIS — E559 Vitamin D deficiency, unspecified: Secondary | ICD-10-CM | POA: Diagnosis not present

## 2022-01-13 DIAGNOSIS — Z79899 Other long term (current) drug therapy: Secondary | ICD-10-CM | POA: Diagnosis not present

## 2022-01-13 DIAGNOSIS — E119 Type 2 diabetes mellitus without complications: Secondary | ICD-10-CM | POA: Diagnosis not present

## 2022-01-14 DIAGNOSIS — E559 Vitamin D deficiency, unspecified: Secondary | ICD-10-CM | POA: Diagnosis not present

## 2022-01-14 DIAGNOSIS — A5217 General paresis: Secondary | ICD-10-CM | POA: Diagnosis not present

## 2022-01-20 ENCOUNTER — Telehealth: Payer: Self-pay | Admitting: Neurology

## 2022-01-20 DIAGNOSIS — I10 Essential (primary) hypertension: Secondary | ICD-10-CM | POA: Diagnosis not present

## 2022-01-20 NOTE — Telephone Encounter (Addendum)
I spoke to patient's husband. His wife is currently at [facility] after her hospitalization (weakness, dehydration, acute cystitis, severe sepsis). Admitted to rehab on 12/29/21. Per husband, she has experienced two episodes of sudden weakness and fatigue. Also, during the event she reported that she could hear her aids speaking to her but could not verbally reply back. He is unsure how long the symptoms lasted. The resident PA recommended further neurological evaluation. I called [facility] and spoke to receptionist,  Verdene Lennert [phone]). The patient has been placed on the schedule 01/28/22 with Dr. Leonie Man. She will give the appt information to the transportation coordinator, Konrad Saha. Provided our number to call in case there is a problem with the time. I called her husband again and provided him with the appt time.

## 2022-01-20 NOTE — Telephone Encounter (Signed)
Pt's husband, Vera Furniss (on last DPR in chart) she is still having seizures. Pt is in [facility] facility. Call back as soon as possible.

## 2022-01-25 ENCOUNTER — Encounter: Payer: Self-pay | Admitting: Internal Medicine

## 2022-01-26 ENCOUNTER — Ambulatory Visit: Payer: PPO | Admitting: Neurology

## 2022-01-26 ENCOUNTER — Encounter: Payer: Self-pay | Admitting: Neurology

## 2022-01-26 DIAGNOSIS — G8220 Paraplegia, unspecified: Secondary | ICD-10-CM | POA: Diagnosis not present

## 2022-01-26 DIAGNOSIS — E785 Hyperlipidemia, unspecified: Secondary | ICD-10-CM | POA: Diagnosis not present

## 2022-01-26 DIAGNOSIS — I10 Essential (primary) hypertension: Secondary | ICD-10-CM | POA: Diagnosis not present

## 2022-01-26 DIAGNOSIS — E559 Vitamin D deficiency, unspecified: Secondary | ICD-10-CM | POA: Diagnosis not present

## 2022-01-26 NOTE — Telephone Encounter (Signed)
Spoke with Kasandra Knudsen and he gave me the phone number to speak with Mickel Baas.  He states there is a form to fill out.  Spoke with Mickel Baas and she took a message and will either have the person to call back or fax the form.   [phone]

## 2022-01-27 ENCOUNTER — Telehealth: Payer: Self-pay | Admitting: *Deleted

## 2022-01-27 NOTE — Telephone Encounter (Signed)
Patient is requesting a virtual visit for a hospital follow up. Okay to schedule?

## 2022-01-28 ENCOUNTER — Telehealth (INDEPENDENT_AMBULATORY_CARE_PROVIDER_SITE_OTHER): Payer: PPO | Admitting: Neurology

## 2022-01-28 ENCOUNTER — Other Ambulatory Visit: Payer: Self-pay | Admitting: Neurology

## 2022-01-28 DIAGNOSIS — G9340 Encephalopathy, unspecified: Secondary | ICD-10-CM

## 2022-01-28 DIAGNOSIS — I10 Essential (primary) hypertension: Secondary | ICD-10-CM | POA: Diagnosis not present

## 2022-01-28 DIAGNOSIS — R569 Unspecified convulsions: Secondary | ICD-10-CM

## 2022-01-28 DIAGNOSIS — G40909 Epilepsy, unspecified, not intractable, without status epilepticus: Secondary | ICD-10-CM | POA: Diagnosis not present

## 2022-01-28 MED ORDER — LACOSAMIDE 100 MG PO TABS: 100.0000 mg | ORAL_TABLET | Freq: Two times a day (BID) | ORAL | 1 refills | Status: DC

## 2022-01-28 MED ORDER — LEVETIRACETAM 500 MG PO TABS: 500.0000 mg | ORAL_TABLET | Freq: Two times a day (BID) | ORAL | 3 refills | Status: DC

## 2022-01-28 MED ORDER — LACOSAMIDE 100 MG PO TABS: 100.0000 mg | ORAL_TABLET | Freq: Two times a day (BID) | ORAL | 3 refills | Status: DC

## 2022-01-28 NOTE — Patient Instructions (Signed)
I had a long discussion with the patient and her husband via video visit regarding her recent hospitalization for partial seizures.  She had a recent breakthrough seizure last week hence I recommend she increase the dose of Vimpat 200 mg in the morning and 200 mg at night and continue Keppra and the current dose of 500 mg twice daily.  She was advised to avoid seizure provoking triggers like sleep deprivation, medication noncompliance and extremes of activities.  She was advised to keep her scheduled appointment to see me in September or call earlier if needed.

## 2022-01-28 NOTE — Progress Notes (Signed)
Virtual Visit via Video Note  I connected with Stacy Diaz on 01/28/22 at  9:30 AM EDT by a video enabled telemedicine application and verified that I am speaking with the correct person using two identifiers.  Location: Patient: At her home Provider: [facility] office   I discussed the limitations of evaluation and management by telemedicine and the availability of in person appointments. The patient expressed understanding and agreed to proceed.  The patient's husband was also present for this visit.  History of Present Illness: Patient requested this visit as she recently got home from prolonged hospital and rehab stay for seizures.71yo with a history of premature CVD and prior stroke, meningioma status post resection 1999 with spastic paraplegia requiring wheelchair, systolic CHF with a EF 19-14%, chronic pain, seizure disorder, breast cancer status post lumpectomy and radiation, and HTN who awoke the morning on 12/22/2021 unable to speak with possible left arm weakness.  Code stroke was initiated upon ER arrival but upon evaluation neurology felt her symptoms most likely represented acute metabolic encephalopathy instead of stroke.  CT head showed old encephalomalacia and cerebral brain and chronic white matter changes and old craniectomy changes.  EEG showed cortical dysfunction in the left central parietal region and moderate diffuse encephalopathy.  No definite seizure activity35.  Neurology felt she had generalized encephalopathy from infection in setting of poor cognitive reserve with worsening of old symptoms but subsequently was noted to have focal seizures and started on Keppra initially and subsequently Vimpat was added.  She was found to have severe sepsis due to E. coli and Proteus urinary tract infection.  She was treated initially with Rocephin which was sensitive to both organisms subsequently switched to Keflex.  She was transferred to rehabilitation at [facility] which made gradual  improvement and she is recently come home.  She states she had 1 more breakthrough seizure last Tuesday which was brief and did not last long.  She is tolerating Keppra finder milligram twice daily as well as Vimpat 100 mg twice daily without any significant side effects.  She feels she has not returned back to her baseline and she cannot multitask and her cognition is not as sharp as it used to be and she has some trouble finding words and has to think about the words.  She is scheduled to start home physical and Occupational Therapy soon.      Observations/Objective: Exam limited due to constraints of video visit.  She is awake alert oriented to time place and person.  Speech is slightly slow and hesitant with some word finding difficulties.  No dysarthria.  Extraocular movements full range without nystagmus.  Face is symmetric without weakness.  Tongue midline.  Motor system exam shows no upper extremity drift with diminished fine finger movements and grip weakness on the left and orbits right over left upper extremity.  Bilateral lower extremity paraplegia with no movements.  Gait not tested.   Assessment and Plan: 71 year old lady with chronic paraplegia following meningioma surgery in 1998 with recent episodes of focal seizures in the setting of UTI and sepsis.  Patient had recent hospitalization and Trileptal was discontinued and she was put on Keppra and Vimpat but had 1 breakthrough episode last week.   Follow Up Instructions: I had a long discussion with the patient and her husband via video visit regarding her recent hospitalization for partial seizures.  She had a recent breakthrough seizure last week hence I recommend she increase the dose of Vimpat 200 mg  in the morning and 200 mg at night and continue Keppra and the current dose of 500 mg twice daily.  She was advised to avoid seizure provoking triggers like sleep deprivation, medication noncompliance and extremes of activities.  She was  advised to keep her scheduled appointment to see me in September or call earlier if needed.   I discussed the assessment and treatment plan with the patient. The patient was provided an opportunity to ask questions and all were answered. The patient agreed with the plan and demonstrated an understanding of the instructions.   The patient was advised to call back or seek an in-person evaluation if the symptoms worsen or if the condition fails to improve as anticipated.  I provided 35 minutes of non-face-to-face time during this encounter.   Antony Contras, MD

## 2022-01-29 ENCOUNTER — Telehealth (INDEPENDENT_AMBULATORY_CARE_PROVIDER_SITE_OTHER): Payer: PPO | Admitting: Internal Medicine

## 2022-01-29 DIAGNOSIS — E785 Hyperlipidemia, unspecified: Secondary | ICD-10-CM | POA: Diagnosis not present

## 2022-01-29 DIAGNOSIS — A419 Sepsis, unspecified organism: Secondary | ICD-10-CM

## 2022-01-29 DIAGNOSIS — N3000 Acute cystitis without hematuria: Secondary | ICD-10-CM

## 2022-01-29 DIAGNOSIS — G8220 Paraplegia, unspecified: Secondary | ICD-10-CM

## 2022-01-29 DIAGNOSIS — R6520 Severe sepsis without septic shock: Secondary | ICD-10-CM | POA: Diagnosis not present

## 2022-01-29 DIAGNOSIS — I10 Essential (primary) hypertension: Secondary | ICD-10-CM

## 2022-01-29 DIAGNOSIS — Z09 Encounter for follow-up examination after completed treatment for conditions other than malignant neoplasm: Secondary | ICD-10-CM | POA: Diagnosis not present

## 2022-01-29 DIAGNOSIS — G9341 Metabolic encephalopathy: Secondary | ICD-10-CM | POA: Diagnosis not present

## 2022-01-29 DIAGNOSIS — G40909 Epilepsy, unspecified, not intractable, without status epilepticus: Secondary | ICD-10-CM | POA: Diagnosis not present

## 2022-01-29 NOTE — Telephone Encounter (Signed)
Patient was seen 01/29/22.

## 2022-01-29 NOTE — Progress Notes (Signed)
Virtual Visit via Video Note  I connected with Stacy Diaz on 01/29/22 at 10:00 AM EDT by a video enabled telemedicine application and verified that I am speaking with the correct person using two identifiers.  Location patient: home Location provider: work office Persons participating in the virtual visit: patient, provider, husband Stacy Diaz  I discussed the limitations of evaluation and management by telemedicine and the availability of in person appointments. The patient expressed understanding and agreed to proceed.   HPI: Stacy Diaz and Stacy Diaz have scheduled this appointment for hospital follow-up.  She was hospitalized from May 8 through May 15 due to breakthrough seizures with acute metabolic encephalopathy.  She also had severe sepsis due to to an E. coli and Proteus UTI.  She completed antibiotic therapy in the hospital.  She was initially thought to have had a stroke or possibly TIAs but these were in fact breakthrough seizures.  Her medications were adjusted to Keppra and Vimpat.  On 5/15 she was discharged to [place].  She was discharged home on June 13.  She is feeling improved.  She feels like she was making some progress with physical therapy.  She has her home health therapy evaluation tomorrow.  She already followed up with her neurologist, Dr. Leonie Man, yesterday and her seizure medication dose was adjusted.  She does not need medication refills.   ROS: Constitutional: Denies fever, chills, diaphoresis, appetite change and fatigue.  HEENT: Denies photophobia, eye pain, redness, hearing loss, ear pain, congestion, sore throat, rhinorrhea, sneezing, mouth sores, trouble swallowing, neck pain, neck stiffness and tinnitus.   Respiratory: Denies SOB, DOE, cough, chest tightness,  and wheezing.   Cardiovascular: Denies chest pain, palpitations and leg swelling.  Gastrointestinal: Denies nausea, vomiting, abdominal pain, diarrhea, constipation, blood in stool and abdominal  distention.  Genitourinary: Denies dysuria, urgency, frequency, hematuria, flank pain and difficulty urinating.  Endocrine: Denies: hot or cold intolerance, sweats, changes in hair or nails, polyuria, polydipsia. Skin: Denies pallor, rash and wound.  Hematological: Denies adenopathy. Easy bruising, personal or family bleeding history  Psychiatric/Behavioral: Denies suicidal ideation, mood changes, confusion, nervousness, sleep disturbance and agitation   Past Medical History:  Diagnosis Date   Anxiety    Arthritis    "probably" (08/25/2012)   Breast cancer of lower-outer quadrant of left female breast ([place]) 05/25/2014   Headache(784.0)    "not real frequent" (08/25/2012)   Heart disease    HTN (hypertension)    Hyperlipidemia    Meningioma ([place]) 1999   Myocardial infarction ([place]) 1987   Paresis of lower extremity ([place]) 1999   BLE/notes 08/25/2012   S/P radiation therapy 07/30/14-08/30/13   left breast cancer/50Gy   Seizures ([place])    no seizures since 2014   Spastic paraparesis    S/P meningioma resection 1999/notes 08/25/2012   Stroke ([place])    x 2, the last 2014    Past Surgical History:  Procedure Laterality Date   BRAIN MENINGIOMA EXCISION  05/1997   BREAST LUMPECTOMY WITH NEEDLE LOCALIZATION AND AXILLARY SENTINEL LYMPH NODE BX Left 06/22/2014   Procedure: LEFT [place];  Surgeon: Autumn Messing III, MD;  Location: [facility];  Service: General;  Laterality: Left;   [place]   denied intervention: "blockage wasn't bad enough"   LEFT HEART CATH AND CORONARY ANGIOGRAPHY N/A 07/08/2021   Procedure: LEFT HEART CATH AND CORONARY ANGIOGRAPHY;  Surgeon: Troy Sine, MD;  Location:  [facility];  Service: Cardiovascular;  Laterality: N/A;   TIBIA IM NAIL INSERTION Right 04/26/2018   Procedure: INTRAMEDULLARY (IM) NAIL TIBIAL;  Surgeon: Altamese Park Layne, MD;  Location: [facility];  Service: Orthopedics;   Laterality: Right;    Family History  Problem Relation Age of Onset   Breast cancer Mother 30       unilateral   Heart disease Father    Breast cancer Paternal Grandmother 19       unilateral    SOCIAL HX:   reports that she has quit smoking. Her smoking use included cigarettes. She has a 20.00 pack-year smoking history. She has never used smokeless tobacco. She reports that she does not drink alcohol and does not use drugs.   Current Outpatient Medications:    ASPERCREME LIDOCAINE EX, Apply 1 application topically daily as needed (muscle pain)., Disp: , Rfl:    atorvastatin (LIPITOR) 10 MG tablet, Take 1 tablet (10 mg total) by mouth daily at 6 PM., Disp: 90 tablet, Rfl: 1   baclofen (LIORESAL) 20 MG tablet, TAKE ONE TABLET BY MOUTH FOUR TIMES DAILY, Disp: 360 tablet, Rfl: 0   calcium carbonate (OS-CAL - DOSED IN MG OF ELEMENTAL CALCIUM) 1250 (500 Ca) MG tablet, Take 1 tablet (500 mg of elemental calcium total) by mouth 2 (two) times daily with a meal., Disp: , Rfl:    carboxymethylcellulose (REFRESH PLUS) 0.5 % SOLN, Place 1 drop into both eyes every morning., Disp: , Rfl:    clopidogrel (PLAVIX) 75 MG tablet, Take 1 tablet (75 mg total) by mouth daily., Disp: 90 tablet, Rfl: 1   diazepam (VALIUM) 5 MG tablet, Take 1 tablet (5 mg total) by mouth daily., Disp: 30 tablet, Rfl: 0   Lacosamide 100 MG TABS, Take 1-2 tablets (100-200 mg total) by mouth 2 (two) times daily. Take 1 tablet in morning and two tablets at night, Disp: 270 tablet, Rfl: 1   levETIRAcetam (KEPPRA) 500 MG tablet, Take 1 tablet (500 mg total) by mouth 2 (two) times daily., Disp: 180 tablet, Rfl: 3   lisinopril (ZESTRIL) 40 MG tablet, Take 1 tablet (40 mg total) by mouth daily., Disp: 90 tablet, Rfl: 1   liver oil-zinc oxide (DESITIN) 40 % ointment, Apply 1 application topically at bedtime. Apply to buttocks, Disp: , Rfl:    melatonin 5 MG TABS, Take 5 mg by mouth at bedtime., Disp: , Rfl:    metoprolol tartrate  (LOPRESSOR) 100 MG tablet, Take 1 tablet (100 mg total) by mouth 2 (two) times daily., Disp: 180 tablet, Rfl: 1   Multiple Vitamin (MULTIVITAMIN WITH MINERALS) TABS tablet, Take 1 tablet by mouth every morning. , Disp: , Rfl:    oxyCODONE 10 MG TABS, Take 1 tablet (10 mg total) by mouth every 8 (eight) hours as needed for moderate pain or severe pain., Disp: 30 tablet, Rfl: 0   polyethylene glycol (MIRALAX / GLYCOLAX) packet, Take 17 g by mouth once a week. Mix with liquid and drink, Disp: , Rfl:   EXAM:   VITALS per patient if applicable: None reported  GENERAL: alert, oriented, appears well and in no acute distress, lying in hospital bed, clear thought processes and clear mentation without slurred speech.  HEENT: atraumatic, conjunttiva clear, no obvious abnormalities on inspection of external nose and ears  NECK: normal movements of the head and neck  LUNGS: on inspection no signs of respiratory distress, breathing rate appears normal, no obvious gross increased work of breathing, gasping or wheezing  CV: no obvious cyanosis  PSYCH/NEURO: pleasant and cooperative, no obvious depression or anxiety, speech and thought processing grossly intact  ASSESSMENT AND PLAN:   Hospital discharge follow-up  Seizure disorder ([place])  Acute metabolic encephalopathy  Severe sepsis ([place])  Acute cystitis without hematuria  Hyperlipidemia, unspecified hyperlipidemia type  Essential hypertension  Paraplegia [facility])  [facility] charts have been reviewed in great detail, I do not have any SNF records for review today. -Medication list has been updated. -We have talked about strategies to possibly prevent recurrent UTIs including minimizing use of diapers and quickly changing soiled and wet diapers.  She has a pure wick catheter that she has been using during the nighttime. -I have confirmed that home health therapy is starting tomorrow. -I have confirmed that she has already followed up with  neurology and has a follow-up appointment scheduled for December.   I discussed the assessment and treatment plan with the patient. The patient was provided an opportunity to ask questions and all were answered. The patient agreed with the plan and demonstrated an understanding of the instructions.   The patient was advised to call back or seek an in-person evaluation if the symptoms worsen or if the condition fails to improve as anticipated.  Time spent: 37 minutes reviewing chart, interviewing patient and husband and formulating plan of care.    Lelon Frohlich, MD  [facility]

## 2022-01-30 DIAGNOSIS — G40919 Epilepsy, unspecified, intractable, without status epilepticus: Secondary | ICD-10-CM | POA: Diagnosis not present

## 2022-01-31 DIAGNOSIS — I69354 Hemiplegia and hemiparesis following cerebral infarction affecting left non-dominant side: Secondary | ICD-10-CM | POA: Diagnosis not present

## 2022-01-31 DIAGNOSIS — E785 Hyperlipidemia, unspecified: Secondary | ICD-10-CM | POA: Diagnosis not present

## 2022-01-31 DIAGNOSIS — G9341 Metabolic encephalopathy: Secondary | ICD-10-CM | POA: Diagnosis not present

## 2022-01-31 DIAGNOSIS — C50912 Malignant neoplasm of unspecified site of left female breast: Secondary | ICD-10-CM | POA: Diagnosis not present

## 2022-01-31 DIAGNOSIS — K5900 Constipation, unspecified: Secondary | ICD-10-CM | POA: Diagnosis not present

## 2022-01-31 DIAGNOSIS — I2510 Atherosclerotic heart disease of native coronary artery without angina pectoris: Secondary | ICD-10-CM | POA: Diagnosis not present

## 2022-01-31 DIAGNOSIS — I110 Hypertensive heart disease with heart failure: Secondary | ICD-10-CM | POA: Diagnosis not present

## 2022-01-31 DIAGNOSIS — N390 Urinary tract infection, site not specified: Secondary | ICD-10-CM | POA: Diagnosis not present

## 2022-01-31 DIAGNOSIS — Z7902 Long term (current) use of antithrombotics/antiplatelets: Secondary | ICD-10-CM | POA: Diagnosis not present

## 2022-01-31 DIAGNOSIS — M1990 Unspecified osteoarthritis, unspecified site: Secondary | ICD-10-CM | POA: Diagnosis not present

## 2022-01-31 DIAGNOSIS — G4089 Other seizures: Secondary | ICD-10-CM | POA: Diagnosis not present

## 2022-01-31 DIAGNOSIS — Z86011 Personal history of benign neoplasm of the brain: Secondary | ICD-10-CM | POA: Diagnosis not present

## 2022-01-31 DIAGNOSIS — G8929 Other chronic pain: Secondary | ICD-10-CM | POA: Diagnosis not present

## 2022-01-31 DIAGNOSIS — I252 Old myocardial infarction: Secondary | ICD-10-CM | POA: Diagnosis not present

## 2022-01-31 DIAGNOSIS — F419 Anxiety disorder, unspecified: Secondary | ICD-10-CM | POA: Diagnosis not present

## 2022-01-31 DIAGNOSIS — Z87891 Personal history of nicotine dependence: Secondary | ICD-10-CM | POA: Diagnosis not present

## 2022-01-31 DIAGNOSIS — I5020 Unspecified systolic (congestive) heart failure: Secondary | ICD-10-CM | POA: Diagnosis not present

## 2022-02-02 ENCOUNTER — Telehealth: Payer: Self-pay

## 2022-02-02 ENCOUNTER — Telehealth: Payer: Self-pay | Admitting: Internal Medicine

## 2022-02-02 NOTE — Telephone Encounter (Signed)
Attempted to call Pam, but voicemail is not set up.

## 2022-02-02 NOTE — Telephone Encounter (Signed)
Stacy Diaz is requesting prescription clarification for Lacosamide 100 MG tab.  It is written as "Take 1-2 tablets (100-200 mg total) by mouth 2 (two) times daily. Take 1 tablet in morning and two tablets at night."  It is documented in the HPI as "... Vimpat 100 mg twice daily" and the follow up instructions state "I recommend she increase the dose of Vimpat 200 mg in the morning and 200 mg at night".  Please advise on script clarification.

## 2022-02-02 NOTE — Telephone Encounter (Signed)
Patient went in the hospital on 05/08-05/15 for UTI--came home from [facility] on 01/27/22.  Pt had sepsis from UTI and medication management so was in the hospital for this.   Skilled nursing orders needed--  1 X 2 weeks 2 x 1 week 1 x 1 week 3 x 1 month 2 prn

## 2022-02-02 NOTE — Telephone Encounter (Signed)
Okay for verbal orders. 

## 2022-02-03 NOTE — Telephone Encounter (Signed)
Verbal orders given to [facility] for skilled nursing.

## 2022-02-09 MED ORDER — LACOSAMIDE 100 MG PO TABS: ORAL_TABLET | ORAL | 1 refills | Status: DC

## 2022-02-10 ENCOUNTER — Telehealth: Payer: Self-pay | Admitting: Internal Medicine

## 2022-02-16 DIAGNOSIS — M1990 Unspecified osteoarthritis, unspecified site: Secondary | ICD-10-CM | POA: Diagnosis not present

## 2022-02-16 DIAGNOSIS — K5900 Constipation, unspecified: Secondary | ICD-10-CM | POA: Diagnosis not present

## 2022-02-16 DIAGNOSIS — N390 Urinary tract infection, site not specified: Secondary | ICD-10-CM | POA: Diagnosis not present

## 2022-02-16 DIAGNOSIS — Z87891 Personal history of nicotine dependence: Secondary | ICD-10-CM | POA: Diagnosis not present

## 2022-02-16 DIAGNOSIS — Z86011 Personal history of benign neoplasm of the brain: Secondary | ICD-10-CM | POA: Diagnosis not present

## 2022-02-16 DIAGNOSIS — I252 Old myocardial infarction: Secondary | ICD-10-CM | POA: Diagnosis not present

## 2022-02-16 DIAGNOSIS — Z7902 Long term (current) use of antithrombotics/antiplatelets: Secondary | ICD-10-CM | POA: Diagnosis not present

## 2022-02-16 DIAGNOSIS — G8929 Other chronic pain: Secondary | ICD-10-CM | POA: Diagnosis not present

## 2022-02-16 DIAGNOSIS — I2510 Atherosclerotic heart disease of native coronary artery without angina pectoris: Secondary | ICD-10-CM | POA: Diagnosis not present

## 2022-02-16 DIAGNOSIS — C50912 Malignant neoplasm of unspecified site of left female breast: Secondary | ICD-10-CM | POA: Diagnosis not present

## 2022-02-16 DIAGNOSIS — I110 Hypertensive heart disease with heart failure: Secondary | ICD-10-CM | POA: Diagnosis not present

## 2022-02-16 DIAGNOSIS — F419 Anxiety disorder, unspecified: Secondary | ICD-10-CM | POA: Diagnosis not present

## 2022-02-16 DIAGNOSIS — E785 Hyperlipidemia, unspecified: Secondary | ICD-10-CM | POA: Diagnosis not present

## 2022-02-16 DIAGNOSIS — I69354 Hemiplegia and hemiparesis following cerebral infarction affecting left non-dominant side: Secondary | ICD-10-CM | POA: Diagnosis not present

## 2022-02-16 DIAGNOSIS — I5020 Unspecified systolic (congestive) heart failure: Secondary | ICD-10-CM | POA: Diagnosis not present

## 2022-02-16 DIAGNOSIS — G9341 Metabolic encephalopathy: Secondary | ICD-10-CM | POA: Diagnosis not present

## 2022-02-16 DIAGNOSIS — G4089 Other seizures: Secondary | ICD-10-CM | POA: Diagnosis not present

## 2022-02-18 ENCOUNTER — Telehealth: Payer: Self-pay | Admitting: Neurology

## 2022-02-18 MED ORDER — LEVETIRACETAM 500 MG PO TABS: 500.0000 mg | ORAL_TABLET | Freq: Two times a day (BID) | ORAL | 3 refills | Status: DC

## 2022-02-18 NOTE — Telephone Encounter (Signed)
Pt is requesting a refill for levETIRAcetam (KEPPRA) 500 MG tablet .  Pharmacy: [facility] # [phone]

## 2022-02-18 NOTE — Telephone Encounter (Signed)
Resent rx

## 2022-02-19 DIAGNOSIS — I10 Essential (primary) hypertension: Secondary | ICD-10-CM | POA: Diagnosis not present

## 2022-02-19 DIAGNOSIS — Z515 Encounter for palliative care: Secondary | ICD-10-CM | POA: Diagnosis not present

## 2022-02-19 DIAGNOSIS — G40909 Epilepsy, unspecified, not intractable, without status epilepticus: Secondary | ICD-10-CM | POA: Diagnosis not present

## 2022-03-02 ENCOUNTER — Encounter: Payer: Self-pay | Admitting: Internal Medicine

## 2022-03-02 DIAGNOSIS — Z7902 Long term (current) use of antithrombotics/antiplatelets: Secondary | ICD-10-CM | POA: Diagnosis not present

## 2022-03-02 DIAGNOSIS — N390 Urinary tract infection, site not specified: Secondary | ICD-10-CM | POA: Diagnosis not present

## 2022-03-02 DIAGNOSIS — M1990 Unspecified osteoarthritis, unspecified site: Secondary | ICD-10-CM | POA: Diagnosis not present

## 2022-03-02 DIAGNOSIS — I5020 Unspecified systolic (congestive) heart failure: Secondary | ICD-10-CM | POA: Diagnosis not present

## 2022-03-02 DIAGNOSIS — E785 Hyperlipidemia, unspecified: Secondary | ICD-10-CM | POA: Diagnosis not present

## 2022-03-02 DIAGNOSIS — K5900 Constipation, unspecified: Secondary | ICD-10-CM | POA: Diagnosis not present

## 2022-03-02 DIAGNOSIS — G9341 Metabolic encephalopathy: Secondary | ICD-10-CM | POA: Diagnosis not present

## 2022-03-02 DIAGNOSIS — C50912 Malignant neoplasm of unspecified site of left female breast: Secondary | ICD-10-CM | POA: Diagnosis not present

## 2022-03-02 DIAGNOSIS — Z86011 Personal history of benign neoplasm of the brain: Secondary | ICD-10-CM | POA: Diagnosis not present

## 2022-03-02 DIAGNOSIS — Z87891 Personal history of nicotine dependence: Secondary | ICD-10-CM | POA: Diagnosis not present

## 2022-03-02 DIAGNOSIS — I252 Old myocardial infarction: Secondary | ICD-10-CM | POA: Diagnosis not present

## 2022-03-02 DIAGNOSIS — I110 Hypertensive heart disease with heart failure: Secondary | ICD-10-CM | POA: Diagnosis not present

## 2022-03-02 DIAGNOSIS — G4089 Other seizures: Secondary | ICD-10-CM | POA: Diagnosis not present

## 2022-03-02 DIAGNOSIS — I69354 Hemiplegia and hemiparesis following cerebral infarction affecting left non-dominant side: Secondary | ICD-10-CM | POA: Diagnosis not present

## 2022-03-02 DIAGNOSIS — F419 Anxiety disorder, unspecified: Secondary | ICD-10-CM | POA: Diagnosis not present

## 2022-03-02 DIAGNOSIS — I2510 Atherosclerotic heart disease of native coronary artery without angina pectoris: Secondary | ICD-10-CM | POA: Diagnosis not present

## 2022-03-02 DIAGNOSIS — G8929 Other chronic pain: Secondary | ICD-10-CM | POA: Diagnosis not present

## 2022-03-03 ENCOUNTER — Telehealth (INDEPENDENT_AMBULATORY_CARE_PROVIDER_SITE_OTHER): Payer: PPO | Admitting: Family Medicine

## 2022-03-03 ENCOUNTER — Encounter: Payer: Self-pay | Admitting: Family Medicine

## 2022-03-03 DIAGNOSIS — N390 Urinary tract infection, site not specified: Secondary | ICD-10-CM | POA: Diagnosis not present

## 2022-03-03 MED ORDER — CIPROFLOXACIN HCL 500 MG PO TABS: 500.0000 mg | ORAL_TABLET | Freq: Two times a day (BID) | ORAL | 0 refills | Status: DC

## 2022-03-03 NOTE — Progress Notes (Signed)
Subjective:    Patient ID: Stacy Diaz, female    DOB: [birth year], 71 y.o.   MRN: [id number]  HPI Virtual Visit via Video Note  I connected with the patient on 03/03/22 at  2:15 PM EDT by a video enabled telemedicine application and verified that I am speaking with the correct person using two identifiers.  Location patient: home Location provider:work or home office Persons participating in the virtual visit: patient, provider  I discussed the limitations of evaluation and management by telemedicine and the availability of in person appointments. The patient expressed understanding and agreed to proceed.   HPI: Here for what she thinks is another UTI. This started yesterday, and she has burning on urination, urgency, right flank pain, and the urine has a foul odor. No fever or nausea. She drinks lots of water every day. Of note she was hospitalized from 12-22-21 to 12-29-21 for sepsis and metabolic encephalopathy arising from a UTI. The culture grew E coli and a Proteus species. She was treated with Rocephin and then was sent to a rehab facility on Keflex. After that she felt like the UTI was totally resolved until yesterday.    ROS: See pertinent positives and negatives per HPI.  Past Medical History:  Diagnosis Date   Anxiety    Arthritis    "probably" (08/25/2012)   Breast cancer of lower-outer quadrant of left female breast ([place]) 05/25/2014   Headache(784.0)    "not real frequent" (08/25/2012)   Heart disease    HTN (hypertension)    Hyperlipidemia    Meningioma ([place]) 1999   Myocardial infarction ([place]) 1987   Paresis of lower extremity ([place]) 1999   BLE/notes 08/25/2012   S/P radiation therapy 07/30/14-08/30/13   left breast cancer/50Gy   Seizures ([place])    no seizures since 2014   Spastic paraparesis    S/P meningioma resection 1999/notes 08/25/2012   Stroke ([place])    x 2, the last 2014    Past Surgical History:  Procedure Laterality Date   BRAIN MENINGIOMA EXCISION   05/1997   BREAST LUMPECTOMY WITH NEEDLE LOCALIZATION AND AXILLARY SENTINEL LYMPH NODE BX Left 06/22/2014   Procedure: LEFT [place];  Surgeon: Autumn Messing III, MD;  Location: [facility];  Service: General;  Laterality: Left;   [place]   denied intervention: "blockage wasn't bad enough"   LEFT HEART CATH AND CORONARY ANGIOGRAPHY N/A 07/08/2021   Procedure: LEFT HEART CATH AND CORONARY ANGIOGRAPHY;  Surgeon: Troy Sine, MD;  Location: [facility] CV LAB;  Service: Cardiovascular;  Laterality: N/A;   TIBIA IM NAIL INSERTION Right 04/26/2018   Procedure: INTRAMEDULLARY (IM) NAIL TIBIAL;  Surgeon: Altamese Anza, MD;  Location: [facility];  Service: Orthopedics;  Laterality: Right;    Family History  Problem Relation Age of Onset   Breast cancer Mother 39       unilateral   Heart disease Father    Breast cancer Paternal Grandmother 83       unilateral     Current Outpatient Medications:    ASPERCREME LIDOCAINE EX, Apply 1 application topically daily as needed (muscle pain)., Disp: , Rfl:    atorvastatin (LIPITOR) 10 MG tablet, Take 1 tablet (10 mg total) by mouth daily at 6 PM., Disp: 90 tablet, Rfl: 1   baclofen (LIORESAL) 20 MG tablet, TAKE ONE TABLET BY MOUTH FOUR TIMES DAILY, Disp: 360 tablet, Rfl: 0   calcium  carbonate (OS-CAL - DOSED IN MG OF ELEMENTAL CALCIUM) 1250 (500 Ca) MG tablet, Take 1 tablet (500 mg of elemental calcium total) by mouth 2 (two) times daily with a meal., Disp: , Rfl:    carboxymethylcellulose (REFRESH PLUS) 0.5 % SOLN, Place 1 drop into both eyes every morning., Disp: , Rfl:    ciprofloxacin (CIPRO) 500 MG tablet, Take 1 tablet (500 mg total) by mouth 2 (two) times daily., Disp: 14 tablet, Rfl: 0   clopidogrel (PLAVIX) 75 MG tablet, Take 1 tablet (75 mg total) by mouth daily., Disp: 90 tablet, Rfl: 1   diazepam (VALIUM) 5 MG tablet, Take 1 tablet (5 mg total) by mouth daily.,  Disp: 30 tablet, Rfl: 0   Lacosamide 100 MG TABS, Take 100 mg (1 tablet) in morning and 200 mg (two tablets) at night, Disp: 270 tablet, Rfl: 1   levETIRAcetam (KEPPRA) 500 MG tablet, Take 1 tablet (500 mg total) by mouth 2 (two) times daily., Disp: 180 tablet, Rfl: 3   lisinopril (ZESTRIL) 40 MG tablet, Take 1 tablet (40 mg total) by mouth daily., Disp: 90 tablet, Rfl: 1   liver oil-zinc oxide (DESITIN) 40 % ointment, Apply 1 application topically at bedtime. Apply to buttocks, Disp: , Rfl:    melatonin 5 MG TABS, Take 5 mg by mouth at bedtime., Disp: , Rfl:    metoprolol tartrate (LOPRESSOR) 100 MG tablet, Take 1 tablet (100 mg total) by mouth 2 (two) times daily., Disp: 180 tablet, Rfl: 1   Multiple Vitamin (MULTIVITAMIN WITH MINERALS) TABS tablet, Take 1 tablet by mouth every morning. , Disp: , Rfl:    oxyCODONE 10 MG TABS, Take 1 tablet (10 mg total) by mouth every 8 (eight) hours as needed for moderate pain or severe pain., Disp: 30 tablet, Rfl: 0   polyethylene glycol (MIRALAX / GLYCOLAX) packet, Take 17 g by mouth once a week. Mix with liquid and drink, Disp: , Rfl:   EXAM:  VITALS per patient if applicable:  GENERAL: alert, oriented, appears well and in no acute distress  HEENT: atraumatic, conjunttiva clear, no obvious abnormalities on inspection of external nose and ears  NECK: normal movements of the head and neck  LUNGS: on inspection no signs of respiratory distress, breathing rate appears normal, no obvious gross SOB, gasping or wheezing  CV: no obvious cyanosis  MS: moves all visible extremities without noticeable abnormality  PSYCH/NEURO: pleasant and cooperative, no obvious depression or anxiety, speech and thought processing grossly intact  ASSESSMENT AND PLAN: This is likely a recurrent UTI. We will treat this with 7 days of Cipro 500 mg BID. Her family will drop a urine sample off at our lab tomorrow, and we will get a UA and urine culture from that. We spent a  total of ( 32  ) minutes reviewing records and discussing these issues.  Alysia Penna, MD  Discussed the following assessment and plan:  Urinary tract infection without hematuria, site unspecified - Plan: Urinalysis, Urine Culture     I discussed the assessment and treatment plan with the patient. The patient was provided an opportunity to ask questions and all were answered. The patient agreed with the plan and demonstrated an understanding of the instructions.   The patient was advised to call back or seek an in-person evaluation if the symptoms worsen or if the condition fails to improve as anticipated.      Review of Systems     Objective:   Physical Exam  Assessment & Plan:

## 2022-03-04 DIAGNOSIS — G4089 Other seizures: Secondary | ICD-10-CM | POA: Diagnosis not present

## 2022-03-04 DIAGNOSIS — K5900 Constipation, unspecified: Secondary | ICD-10-CM | POA: Diagnosis not present

## 2022-03-04 DIAGNOSIS — Z7902 Long term (current) use of antithrombotics/antiplatelets: Secondary | ICD-10-CM | POA: Diagnosis not present

## 2022-03-04 DIAGNOSIS — I5020 Unspecified systolic (congestive) heart failure: Secondary | ICD-10-CM | POA: Diagnosis not present

## 2022-03-04 DIAGNOSIS — E785 Hyperlipidemia, unspecified: Secondary | ICD-10-CM | POA: Diagnosis not present

## 2022-03-04 DIAGNOSIS — I69354 Hemiplegia and hemiparesis following cerebral infarction affecting left non-dominant side: Secondary | ICD-10-CM | POA: Diagnosis not present

## 2022-03-04 DIAGNOSIS — G9341 Metabolic encephalopathy: Secondary | ICD-10-CM | POA: Diagnosis not present

## 2022-03-04 DIAGNOSIS — Z87891 Personal history of nicotine dependence: Secondary | ICD-10-CM | POA: Diagnosis not present

## 2022-03-04 DIAGNOSIS — C50912 Malignant neoplasm of unspecified site of left female breast: Secondary | ICD-10-CM | POA: Diagnosis not present

## 2022-03-04 DIAGNOSIS — I2510 Atherosclerotic heart disease of native coronary artery without angina pectoris: Secondary | ICD-10-CM | POA: Diagnosis not present

## 2022-03-04 DIAGNOSIS — M1990 Unspecified osteoarthritis, unspecified site: Secondary | ICD-10-CM | POA: Diagnosis not present

## 2022-03-04 DIAGNOSIS — Z86011 Personal history of benign neoplasm of the brain: Secondary | ICD-10-CM | POA: Diagnosis not present

## 2022-03-04 DIAGNOSIS — G8929 Other chronic pain: Secondary | ICD-10-CM | POA: Diagnosis not present

## 2022-03-04 DIAGNOSIS — I110 Hypertensive heart disease with heart failure: Secondary | ICD-10-CM | POA: Diagnosis not present

## 2022-03-04 DIAGNOSIS — F419 Anxiety disorder, unspecified: Secondary | ICD-10-CM | POA: Diagnosis not present

## 2022-03-04 DIAGNOSIS — N390 Urinary tract infection, site not specified: Secondary | ICD-10-CM | POA: Diagnosis not present

## 2022-03-04 DIAGNOSIS — I252 Old myocardial infarction: Secondary | ICD-10-CM | POA: Diagnosis not present

## 2022-03-04 LAB — URINE CULTURE
MICRO NUMBER:: 13661711
SPECIMEN QUALITY:: ADEQUATE

## 2022-03-04 LAB — URINALYSIS, ROUTINE W REFLEX MICROSCOPIC
Bilirubin Urine: NEGATIVE
Hgb urine dipstick: NEGATIVE
Ketones, ur: NEGATIVE
Nitrite: NEGATIVE
RBC / HPF: NONE SEEN (ref 0–?)
Specific Gravity, Urine: <=1.005 — AB (ref 1.000–1.030)
Total Protein, Urine: NEGATIVE
Urine Glucose: NEGATIVE
Urobilinogen, UA: 0.2 (ref 0.0–1.0)
pH: >=9 — AB (ref 5.0–8.0)

## 2022-03-09 DIAGNOSIS — F419 Anxiety disorder, unspecified: Secondary | ICD-10-CM | POA: Diagnosis not present

## 2022-03-09 DIAGNOSIS — N390 Urinary tract infection, site not specified: Secondary | ICD-10-CM | POA: Diagnosis not present

## 2022-03-09 DIAGNOSIS — Z87891 Personal history of nicotine dependence: Secondary | ICD-10-CM | POA: Diagnosis not present

## 2022-03-09 DIAGNOSIS — I110 Hypertensive heart disease with heart failure: Secondary | ICD-10-CM | POA: Diagnosis not present

## 2022-03-09 DIAGNOSIS — G8929 Other chronic pain: Secondary | ICD-10-CM | POA: Diagnosis not present

## 2022-03-09 DIAGNOSIS — E785 Hyperlipidemia, unspecified: Secondary | ICD-10-CM | POA: Diagnosis not present

## 2022-03-09 DIAGNOSIS — I252 Old myocardial infarction: Secondary | ICD-10-CM | POA: Diagnosis not present

## 2022-03-09 DIAGNOSIS — C50912 Malignant neoplasm of unspecified site of left female breast: Secondary | ICD-10-CM | POA: Diagnosis not present

## 2022-03-09 DIAGNOSIS — I2510 Atherosclerotic heart disease of native coronary artery without angina pectoris: Secondary | ICD-10-CM | POA: Diagnosis not present

## 2022-03-09 DIAGNOSIS — I5020 Unspecified systolic (congestive) heart failure: Secondary | ICD-10-CM | POA: Diagnosis not present

## 2022-03-09 DIAGNOSIS — G9341 Metabolic encephalopathy: Secondary | ICD-10-CM | POA: Diagnosis not present

## 2022-03-09 DIAGNOSIS — K5900 Constipation, unspecified: Secondary | ICD-10-CM | POA: Diagnosis not present

## 2022-03-09 DIAGNOSIS — Z7902 Long term (current) use of antithrombotics/antiplatelets: Secondary | ICD-10-CM | POA: Diagnosis not present

## 2022-03-09 DIAGNOSIS — Z86011 Personal history of benign neoplasm of the brain: Secondary | ICD-10-CM | POA: Diagnosis not present

## 2022-03-09 DIAGNOSIS — I69354 Hemiplegia and hemiparesis following cerebral infarction affecting left non-dominant side: Secondary | ICD-10-CM | POA: Diagnosis not present

## 2022-03-09 DIAGNOSIS — M1990 Unspecified osteoarthritis, unspecified site: Secondary | ICD-10-CM | POA: Diagnosis not present

## 2022-03-09 DIAGNOSIS — G4089 Other seizures: Secondary | ICD-10-CM | POA: Diagnosis not present

## 2022-03-12 DIAGNOSIS — I252 Old myocardial infarction: Secondary | ICD-10-CM | POA: Diagnosis not present

## 2022-03-12 DIAGNOSIS — E785 Hyperlipidemia, unspecified: Secondary | ICD-10-CM | POA: Diagnosis not present

## 2022-03-12 DIAGNOSIS — N390 Urinary tract infection, site not specified: Secondary | ICD-10-CM | POA: Diagnosis not present

## 2022-03-12 DIAGNOSIS — I2510 Atherosclerotic heart disease of native coronary artery without angina pectoris: Secondary | ICD-10-CM | POA: Diagnosis not present

## 2022-03-12 DIAGNOSIS — Z7902 Long term (current) use of antithrombotics/antiplatelets: Secondary | ICD-10-CM | POA: Diagnosis not present

## 2022-03-12 DIAGNOSIS — I69354 Hemiplegia and hemiparesis following cerebral infarction affecting left non-dominant side: Secondary | ICD-10-CM | POA: Diagnosis not present

## 2022-03-12 DIAGNOSIS — I110 Hypertensive heart disease with heart failure: Secondary | ICD-10-CM | POA: Diagnosis not present

## 2022-03-12 DIAGNOSIS — G4089 Other seizures: Secondary | ICD-10-CM | POA: Diagnosis not present

## 2022-03-12 DIAGNOSIS — C50912 Malignant neoplasm of unspecified site of left female breast: Secondary | ICD-10-CM | POA: Diagnosis not present

## 2022-03-12 DIAGNOSIS — G9341 Metabolic encephalopathy: Secondary | ICD-10-CM | POA: Diagnosis not present

## 2022-03-12 DIAGNOSIS — G8929 Other chronic pain: Secondary | ICD-10-CM | POA: Diagnosis not present

## 2022-03-12 DIAGNOSIS — F419 Anxiety disorder, unspecified: Secondary | ICD-10-CM | POA: Diagnosis not present

## 2022-03-12 DIAGNOSIS — Z87891 Personal history of nicotine dependence: Secondary | ICD-10-CM | POA: Diagnosis not present

## 2022-03-12 DIAGNOSIS — M1990 Unspecified osteoarthritis, unspecified site: Secondary | ICD-10-CM | POA: Diagnosis not present

## 2022-03-12 DIAGNOSIS — Z86011 Personal history of benign neoplasm of the brain: Secondary | ICD-10-CM | POA: Diagnosis not present

## 2022-03-12 DIAGNOSIS — K5900 Constipation, unspecified: Secondary | ICD-10-CM | POA: Diagnosis not present

## 2022-03-12 DIAGNOSIS — I5020 Unspecified systolic (congestive) heart failure: Secondary | ICD-10-CM | POA: Diagnosis not present

## 2022-03-16 DIAGNOSIS — Z86011 Personal history of benign neoplasm of the brain: Secondary | ICD-10-CM | POA: Diagnosis not present

## 2022-03-16 DIAGNOSIS — I252 Old myocardial infarction: Secondary | ICD-10-CM | POA: Diagnosis not present

## 2022-03-16 DIAGNOSIS — F419 Anxiety disorder, unspecified: Secondary | ICD-10-CM | POA: Diagnosis not present

## 2022-03-16 DIAGNOSIS — M1990 Unspecified osteoarthritis, unspecified site: Secondary | ICD-10-CM | POA: Diagnosis not present

## 2022-03-16 DIAGNOSIS — G4089 Other seizures: Secondary | ICD-10-CM | POA: Diagnosis not present

## 2022-03-16 DIAGNOSIS — I110 Hypertensive heart disease with heart failure: Secondary | ICD-10-CM | POA: Diagnosis not present

## 2022-03-16 DIAGNOSIS — K5900 Constipation, unspecified: Secondary | ICD-10-CM | POA: Diagnosis not present

## 2022-03-16 DIAGNOSIS — E785 Hyperlipidemia, unspecified: Secondary | ICD-10-CM | POA: Diagnosis not present

## 2022-03-16 DIAGNOSIS — N390 Urinary tract infection, site not specified: Secondary | ICD-10-CM | POA: Diagnosis not present

## 2022-03-16 DIAGNOSIS — I69354 Hemiplegia and hemiparesis following cerebral infarction affecting left non-dominant side: Secondary | ICD-10-CM | POA: Diagnosis not present

## 2022-03-16 DIAGNOSIS — I2510 Atherosclerotic heart disease of native coronary artery without angina pectoris: Secondary | ICD-10-CM | POA: Diagnosis not present

## 2022-03-16 DIAGNOSIS — Z87891 Personal history of nicotine dependence: Secondary | ICD-10-CM | POA: Diagnosis not present

## 2022-03-16 DIAGNOSIS — C50912 Malignant neoplasm of unspecified site of left female breast: Secondary | ICD-10-CM | POA: Diagnosis not present

## 2022-03-16 DIAGNOSIS — Z7902 Long term (current) use of antithrombotics/antiplatelets: Secondary | ICD-10-CM | POA: Diagnosis not present

## 2022-03-16 DIAGNOSIS — G8929 Other chronic pain: Secondary | ICD-10-CM | POA: Diagnosis not present

## 2022-03-16 DIAGNOSIS — G9341 Metabolic encephalopathy: Secondary | ICD-10-CM | POA: Diagnosis not present

## 2022-03-16 DIAGNOSIS — I5020 Unspecified systolic (congestive) heart failure: Secondary | ICD-10-CM | POA: Diagnosis not present

## 2022-03-18 ENCOUNTER — Encounter: Payer: Self-pay | Admitting: Internal Medicine

## 2022-03-18 DIAGNOSIS — Z7902 Long term (current) use of antithrombotics/antiplatelets: Secondary | ICD-10-CM | POA: Diagnosis not present

## 2022-03-18 DIAGNOSIS — Z87891 Personal history of nicotine dependence: Secondary | ICD-10-CM | POA: Diagnosis not present

## 2022-03-18 DIAGNOSIS — Z86011 Personal history of benign neoplasm of the brain: Secondary | ICD-10-CM | POA: Diagnosis not present

## 2022-03-18 DIAGNOSIS — N390 Urinary tract infection, site not specified: Secondary | ICD-10-CM | POA: Diagnosis not present

## 2022-03-18 DIAGNOSIS — M1990 Unspecified osteoarthritis, unspecified site: Secondary | ICD-10-CM | POA: Diagnosis not present

## 2022-03-18 DIAGNOSIS — K5900 Constipation, unspecified: Secondary | ICD-10-CM | POA: Diagnosis not present

## 2022-03-18 DIAGNOSIS — G4089 Other seizures: Secondary | ICD-10-CM | POA: Diagnosis not present

## 2022-03-18 DIAGNOSIS — C50912 Malignant neoplasm of unspecified site of left female breast: Secondary | ICD-10-CM | POA: Diagnosis not present

## 2022-03-18 DIAGNOSIS — I2510 Atherosclerotic heart disease of native coronary artery without angina pectoris: Secondary | ICD-10-CM | POA: Diagnosis not present

## 2022-03-18 DIAGNOSIS — E785 Hyperlipidemia, unspecified: Secondary | ICD-10-CM | POA: Diagnosis not present

## 2022-03-18 DIAGNOSIS — G9341 Metabolic encephalopathy: Secondary | ICD-10-CM | POA: Diagnosis not present

## 2022-03-18 DIAGNOSIS — I69354 Hemiplegia and hemiparesis following cerebral infarction affecting left non-dominant side: Secondary | ICD-10-CM | POA: Diagnosis not present

## 2022-03-18 DIAGNOSIS — F419 Anxiety disorder, unspecified: Secondary | ICD-10-CM | POA: Diagnosis not present

## 2022-03-18 DIAGNOSIS — I110 Hypertensive heart disease with heart failure: Secondary | ICD-10-CM | POA: Diagnosis not present

## 2022-03-18 DIAGNOSIS — I252 Old myocardial infarction: Secondary | ICD-10-CM | POA: Diagnosis not present

## 2022-03-18 DIAGNOSIS — I5020 Unspecified systolic (congestive) heart failure: Secondary | ICD-10-CM | POA: Diagnosis not present

## 2022-03-18 DIAGNOSIS — G8929 Other chronic pain: Secondary | ICD-10-CM | POA: Diagnosis not present

## 2022-03-18 NOTE — Telephone Encounter (Signed)
I called [facility] (ph#[phone]) and spoke with Mariann Laster to inform her of the orders as below.  Per Mariann Laster, the clinical manager stated they are not able to give the Prolia injection every 6 months.

## 2022-03-20 ENCOUNTER — Encounter: Payer: Self-pay | Admitting: Neurology

## 2022-03-31 ENCOUNTER — Telehealth: Payer: Self-pay | Admitting: Internal Medicine

## 2022-03-31 ENCOUNTER — Other Ambulatory Visit: Payer: Self-pay | Admitting: Internal Medicine

## 2022-03-31 DIAGNOSIS — I10 Essential (primary) hypertension: Secondary | ICD-10-CM

## 2022-03-31 DIAGNOSIS — G8220 Paraplegia, unspecified: Secondary | ICD-10-CM

## 2022-03-31 DIAGNOSIS — Z8673 Personal history of transient ischemic attack (TIA), and cerebral infarction without residual deficits: Secondary | ICD-10-CM

## 2022-03-31 NOTE — Telephone Encounter (Signed)
Dorian PT with [facility] is calling for  verbal PT orders 1-2 times per wk for 8 wks

## 2022-04-01 ENCOUNTER — Ambulatory Visit: Payer: PPO

## 2022-04-01 NOTE — Telephone Encounter (Signed)
Left detailed message on confidential voicemail for dorian-PT [facility] with verbal orders for PT per Dr Jerilee Hoh

## 2022-04-01 NOTE — Telephone Encounter (Signed)
Attempted to call, but could not leave a message.

## 2022-04-02 DIAGNOSIS — Z7902 Long term (current) use of antithrombotics/antiplatelets: Secondary | ICD-10-CM | POA: Diagnosis not present

## 2022-04-02 DIAGNOSIS — I5020 Unspecified systolic (congestive) heart failure: Secondary | ICD-10-CM | POA: Diagnosis not present

## 2022-04-02 DIAGNOSIS — Z86011 Personal history of benign neoplasm of the brain: Secondary | ICD-10-CM | POA: Diagnosis not present

## 2022-04-02 DIAGNOSIS — K5900 Constipation, unspecified: Secondary | ICD-10-CM | POA: Diagnosis not present

## 2022-04-02 DIAGNOSIS — M1990 Unspecified osteoarthritis, unspecified site: Secondary | ICD-10-CM | POA: Diagnosis not present

## 2022-04-02 DIAGNOSIS — G9341 Metabolic encephalopathy: Secondary | ICD-10-CM | POA: Diagnosis not present

## 2022-04-02 DIAGNOSIS — G931 Anoxic brain damage, not elsewhere classified: Secondary | ICD-10-CM | POA: Diagnosis not present

## 2022-04-02 DIAGNOSIS — G8929 Other chronic pain: Secondary | ICD-10-CM | POA: Diagnosis not present

## 2022-04-02 DIAGNOSIS — Z87891 Personal history of nicotine dependence: Secondary | ICD-10-CM | POA: Diagnosis not present

## 2022-04-02 DIAGNOSIS — E785 Hyperlipidemia, unspecified: Secondary | ICD-10-CM | POA: Diagnosis not present

## 2022-04-02 DIAGNOSIS — G4089 Other seizures: Secondary | ICD-10-CM | POA: Diagnosis not present

## 2022-04-02 DIAGNOSIS — C50912 Malignant neoplasm of unspecified site of left female breast: Secondary | ICD-10-CM | POA: Diagnosis not present

## 2022-04-02 DIAGNOSIS — F419 Anxiety disorder, unspecified: Secondary | ICD-10-CM | POA: Diagnosis not present

## 2022-04-02 DIAGNOSIS — I69354 Hemiplegia and hemiparesis following cerebral infarction affecting left non-dominant side: Secondary | ICD-10-CM | POA: Diagnosis not present

## 2022-04-02 DIAGNOSIS — I110 Hypertensive heart disease with heart failure: Secondary | ICD-10-CM | POA: Diagnosis not present

## 2022-04-02 DIAGNOSIS — I2510 Atherosclerotic heart disease of native coronary artery without angina pectoris: Secondary | ICD-10-CM | POA: Diagnosis not present

## 2022-04-02 DIAGNOSIS — I252 Old myocardial infarction: Secondary | ICD-10-CM | POA: Diagnosis not present

## 2022-04-02 DIAGNOSIS — N390 Urinary tract infection, site not specified: Secondary | ICD-10-CM | POA: Diagnosis not present

## 2022-04-07 DIAGNOSIS — G8929 Other chronic pain: Secondary | ICD-10-CM | POA: Diagnosis not present

## 2022-04-07 DIAGNOSIS — G9341 Metabolic encephalopathy: Secondary | ICD-10-CM | POA: Diagnosis not present

## 2022-04-07 DIAGNOSIS — I5020 Unspecified systolic (congestive) heart failure: Secondary | ICD-10-CM | POA: Diagnosis not present

## 2022-04-07 DIAGNOSIS — Z86011 Personal history of benign neoplasm of the brain: Secondary | ICD-10-CM | POA: Diagnosis not present

## 2022-04-07 DIAGNOSIS — I252 Old myocardial infarction: Secondary | ICD-10-CM | POA: Diagnosis not present

## 2022-04-07 DIAGNOSIS — M1990 Unspecified osteoarthritis, unspecified site: Secondary | ICD-10-CM | POA: Diagnosis not present

## 2022-04-07 DIAGNOSIS — I69354 Hemiplegia and hemiparesis following cerebral infarction affecting left non-dominant side: Secondary | ICD-10-CM | POA: Diagnosis not present

## 2022-04-07 DIAGNOSIS — G931 Anoxic brain damage, not elsewhere classified: Secondary | ICD-10-CM | POA: Diagnosis not present

## 2022-04-07 DIAGNOSIS — C50912 Malignant neoplasm of unspecified site of left female breast: Secondary | ICD-10-CM | POA: Diagnosis not present

## 2022-04-07 DIAGNOSIS — G4089 Other seizures: Secondary | ICD-10-CM | POA: Diagnosis not present

## 2022-04-07 DIAGNOSIS — F419 Anxiety disorder, unspecified: Secondary | ICD-10-CM | POA: Diagnosis not present

## 2022-04-07 DIAGNOSIS — E785 Hyperlipidemia, unspecified: Secondary | ICD-10-CM | POA: Diagnosis not present

## 2022-04-07 DIAGNOSIS — Z87891 Personal history of nicotine dependence: Secondary | ICD-10-CM | POA: Diagnosis not present

## 2022-04-07 DIAGNOSIS — I2510 Atherosclerotic heart disease of native coronary artery without angina pectoris: Secondary | ICD-10-CM | POA: Diagnosis not present

## 2022-04-07 DIAGNOSIS — Z7902 Long term (current) use of antithrombotics/antiplatelets: Secondary | ICD-10-CM | POA: Diagnosis not present

## 2022-04-07 DIAGNOSIS — N390 Urinary tract infection, site not specified: Secondary | ICD-10-CM | POA: Diagnosis not present

## 2022-04-07 DIAGNOSIS — K5900 Constipation, unspecified: Secondary | ICD-10-CM | POA: Diagnosis not present

## 2022-04-07 DIAGNOSIS — I110 Hypertensive heart disease with heart failure: Secondary | ICD-10-CM | POA: Diagnosis not present

## 2022-04-08 ENCOUNTER — Encounter: Payer: Self-pay | Admitting: Internal Medicine

## 2022-04-14 ENCOUNTER — Telehealth (INDEPENDENT_AMBULATORY_CARE_PROVIDER_SITE_OTHER): Payer: PPO | Admitting: Internal Medicine

## 2022-04-14 ENCOUNTER — Other Ambulatory Visit: Payer: Self-pay | Admitting: Internal Medicine

## 2022-04-14 ENCOUNTER — Encounter: Payer: Self-pay | Admitting: Internal Medicine

## 2022-04-14 DIAGNOSIS — Z86011 Personal history of benign neoplasm of the brain: Secondary | ICD-10-CM | POA: Diagnosis not present

## 2022-04-14 DIAGNOSIS — G931 Anoxic brain damage, not elsewhere classified: Secondary | ICD-10-CM | POA: Diagnosis not present

## 2022-04-14 DIAGNOSIS — I110 Hypertensive heart disease with heart failure: Secondary | ICD-10-CM | POA: Diagnosis not present

## 2022-04-14 DIAGNOSIS — G9341 Metabolic encephalopathy: Secondary | ICD-10-CM | POA: Diagnosis not present

## 2022-04-14 DIAGNOSIS — G8929 Other chronic pain: Secondary | ICD-10-CM | POA: Diagnosis not present

## 2022-04-14 DIAGNOSIS — N390 Urinary tract infection, site not specified: Secondary | ICD-10-CM | POA: Diagnosis not present

## 2022-04-14 DIAGNOSIS — R410 Disorientation, unspecified: Secondary | ICD-10-CM

## 2022-04-14 DIAGNOSIS — E785 Hyperlipidemia, unspecified: Secondary | ICD-10-CM | POA: Diagnosis not present

## 2022-04-14 DIAGNOSIS — I69354 Hemiplegia and hemiparesis following cerebral infarction affecting left non-dominant side: Secondary | ICD-10-CM | POA: Diagnosis not present

## 2022-04-14 DIAGNOSIS — R300 Dysuria: Secondary | ICD-10-CM

## 2022-04-14 DIAGNOSIS — I252 Old myocardial infarction: Secondary | ICD-10-CM | POA: Diagnosis not present

## 2022-04-14 DIAGNOSIS — N3000 Acute cystitis without hematuria: Secondary | ICD-10-CM

## 2022-04-14 DIAGNOSIS — I2510 Atherosclerotic heart disease of native coronary artery without angina pectoris: Secondary | ICD-10-CM | POA: Diagnosis not present

## 2022-04-14 DIAGNOSIS — M1990 Unspecified osteoarthritis, unspecified site: Secondary | ICD-10-CM | POA: Diagnosis not present

## 2022-04-14 DIAGNOSIS — Z7902 Long term (current) use of antithrombotics/antiplatelets: Secondary | ICD-10-CM | POA: Diagnosis not present

## 2022-04-14 DIAGNOSIS — I5020 Unspecified systolic (congestive) heart failure: Secondary | ICD-10-CM | POA: Diagnosis not present

## 2022-04-14 DIAGNOSIS — G4089 Other seizures: Secondary | ICD-10-CM | POA: Diagnosis not present

## 2022-04-14 DIAGNOSIS — C50912 Malignant neoplasm of unspecified site of left female breast: Secondary | ICD-10-CM | POA: Diagnosis not present

## 2022-04-14 DIAGNOSIS — F419 Anxiety disorder, unspecified: Secondary | ICD-10-CM | POA: Diagnosis not present

## 2022-04-14 DIAGNOSIS — Z87891 Personal history of nicotine dependence: Secondary | ICD-10-CM | POA: Diagnosis not present

## 2022-04-14 DIAGNOSIS — K5900 Constipation, unspecified: Secondary | ICD-10-CM | POA: Diagnosis not present

## 2022-04-14 DIAGNOSIS — R531 Weakness: Secondary | ICD-10-CM | POA: Diagnosis not present

## 2022-04-14 LAB — POCT URINALYSIS DIPSTICK
Bilirubin, UA: NEGATIVE
Blood, UA: NEGATIVE
Glucose, UA: NEGATIVE
Ketones, UA: NEGATIVE
Nitrite, UA: NEGATIVE
Protein, UA: NEGATIVE
Spec Grav, UA: 1.01 (ref 1.010–1.025)
Urobilinogen, UA: 0.2 E.U./dL
pH, UA: 6.5 (ref 5.0–8.0)

## 2022-04-14 MED ORDER — SULFAMETHOXAZOLE-TRIMETHOPRIM 800-160 MG PO TABS: 1.0000 | ORAL_TABLET | Freq: Two times a day (BID) | ORAL | 0 refills | Status: AC

## 2022-04-14 NOTE — Progress Notes (Signed)
Virtual Visit via Video Note  I connected with Stacy Diaz on 04/14/22 at  4:00 PM EDT by a video enabled telemedicine application and verified that I am speaking with the correct person using two identifiers.  Location patient: home Location provider: work office Persons participating in the virtual visit: patient, provider  I discussed the limitations of evaluation and management by telemedicine and the availability of in person appointments. The patient expressed understanding and agreed to proceed.   HPI: Patient and husband have scheduled this video visit as they believe she might have a UTI.  For the past 5 days she has been more disoriented and confused as well as increased weakness.  She has had some nausea and some dark urine.  She does have a history of TIAs and partial seizures.  No fever.  No urinary symptoms.   ROS: Constitutional: Denies fever, chills, diaphoresis, appetite change . HEENT: Denies photophobia, eye pain, redness, hearing loss, ear pain, congestion, sore throat, rhinorrhea, sneezing, mouth sores, trouble swallowing, neck pain, neck stiffness and tinnitus.   Respiratory: Denies SOB, DOE, cough, chest tightness,  and wheezing.   Cardiovascular: Denies chest pain, palpitations and leg swelling.  Gastrointestinal: Denies , abdominal pain, diarrhea, constipation, blood in stool and abdominal distention.  Genitourinary: Denies dysuria, urgency, frequency, hematuria, flank pain and difficulty urinating.  Endocrine: Denies: hot or cold intolerance, sweats, changes in hair or nails, polyuria, polydipsia. Musculoskeletal: Denies myalgias, back pain, joint swelling, arthralgias and gait problem.  Skin: Denies pallor, rash and wound.  Neurological: Denies dizziness, seizures, syncope, weakness, light-headedness, numbness and headaches.  Hematological: Denies adenopathy. Easy bruising, personal or family bleeding history  Psychiatric/Behavioral: Denies suicidal  ideation, mood changes, confusion, nervousness, sleep disturbance and agitation   Past Medical History:  Diagnosis Date   Anxiety    Arthritis    "probably" (08/25/2012)   Breast cancer of lower-outer quadrant of left female breast ([place]) 05/25/2014   Headache(784.0)    "not real frequent" (08/25/2012)   Heart disease    HTN (hypertension)    Hyperlipidemia    Meningioma ([place]) 1999   Myocardial infarction ([place]) 1987   Paresis of lower extremity ([place]) 1999   BLE/notes 08/25/2012   S/P radiation therapy 07/30/14-08/30/13   left breast cancer/50Gy   Seizures ([place])    no seizures since 2014   Spastic paraparesis    S/P meningioma resection 1999/notes 08/25/2012   Stroke ([place])    x 2, the last 2014    Past Surgical History:  Procedure Laterality Date   BRAIN MENINGIOMA EXCISION  05/1997   BREAST LUMPECTOMY WITH NEEDLE LOCALIZATION AND AXILLARY SENTINEL LYMPH NODE BX Left 06/22/2014   Procedure: LEFT [place];  Surgeon: Autumn Messing III, MD;  Location: [facility];  Service: General;  Laterality: Left;   [place]   denied intervention: "blockage wasn't bad enough"   LEFT HEART CATH AND CORONARY ANGIOGRAPHY N/A 07/08/2021   Procedure: LEFT HEART CATH AND CORONARY ANGIOGRAPHY;  Surgeon: Troy Sine, MD;  Location: [facility];  Service: Cardiovascular;  Laterality: N/A;   TIBIA IM NAIL INSERTION Right 04/26/2018   Procedure: INTRAMEDULLARY (IM) NAIL TIBIAL;  Surgeon: Altamese Crimora, MD;  Location: [facility];  Service: Orthopedics;  Laterality: Right;    Family History  Problem Relation Age of Onset   Breast cancer Mother 82       unilateral   Heart disease  Father    Breast cancer Paternal Grandmother 53       unilateral    SOCIAL HX:   reports that she has quit smoking. Her smoking use included cigarettes. She has a 20.00 pack-year smoking history. She has never used smokeless tobacco. She  reports that she does not drink alcohol and does not use drugs.   Current Outpatient Medications:    ASPERCREME LIDOCAINE EX, Apply 1 application topically daily as needed (muscle pain)., Disp: , Rfl:    atorvastatin (LIPITOR) 10 MG tablet, Take 1 tablet (10 mg total) by mouth daily at 6 PM., Disp: 90 tablet, Rfl: 1   baclofen (LIORESAL) 20 MG tablet, TAKE ONE TABLET BY MOUTH FOUR TIMES DAILY, Disp: 360 tablet, Rfl: 0   calcium carbonate (OS-CAL - DOSED IN MG OF ELEMENTAL CALCIUM) 1250 (500 Ca) MG tablet, Take 1 tablet (500 mg of elemental calcium total) by mouth 2 (two) times daily with a meal., Disp: , Rfl:    carboxymethylcellulose (REFRESH PLUS) 0.5 % SOLN, Place 1 drop into both eyes every morning., Disp: , Rfl:    clopidogrel (PLAVIX) 75 MG tablet, TAKE ONE TABLET BY MOUTH ONE TIME DAILY, Disp: 90 tablet, Rfl: 0   diazepam (VALIUM) 5 MG tablet, Take 1 tablet (5 mg total) by mouth daily., Disp: 30 tablet, Rfl: 0   Lacosamide 100 MG TABS, Take 100 mg (1 tablet) in morning and 200 mg (two tablets) at night, Disp: 270 tablet, Rfl: 1   levETIRAcetam (KEPPRA) 500 MG tablet, Take 1 tablet (500 mg total) by mouth 2 (two) times daily., Disp: 180 tablet, Rfl: 3   lisinopril (ZESTRIL) 40 MG tablet, TAKE ONE TABLET BY MOUTH ONE TIME DAILY, Disp: 90 tablet, Rfl: 0   liver oil-zinc oxide (DESITIN) 40 % ointment, Apply 1 application topically at bedtime. Apply to buttocks, Disp: , Rfl:    melatonin 5 MG TABS, Take 5 mg by mouth at bedtime., Disp: , Rfl:    metoprolol tartrate (LOPRESSOR) 100 MG tablet, Take 1 tablet (100 mg total) by mouth 2 (two) times daily., Disp: 180 tablet, Rfl: 1   Multiple Vitamin (MULTIVITAMIN WITH MINERALS) TABS tablet, Take 1 tablet by mouth every morning. , Disp: , Rfl:    oxyCODONE 10 MG TABS, Take 1 tablet (10 mg total) by mouth every 8 (eight) hours as needed for moderate pain or severe pain., Disp: 30 tablet, Rfl: 0   polyethylene glycol (MIRALAX / GLYCOLAX) packet, Take 17  g by mouth once a week. Mix with liquid and drink, Disp: , Rfl:    sulfamethoxazole-trimethoprim (BACTRIM DS) 800-160 MG tablet, Take 1 tablet by mouth 2 (two) times daily for 5 days., Disp: 10 tablet, Rfl: 0  EXAM:   VITALS per patient if applicable: None reported  GENERAL: alert, oriented, appears well and in no acute distress  HEENT: atraumatic, conjunttiva clear, no obvious abnormalities on inspection of external nose and ears  NECK: normal movements of the head and neck  LUNGS: on inspection no signs of respiratory distress, breathing rate appears normal, no obvious gross increased work of breathing, gasping or wheezing  CV: no obvious cyanosis  MS: moves all visible extremities without noticeable abnormality   ASSESSMENT AND PLAN:   Confusion - Plan: POCT urinalysis dipstick, Urine Culture, Urinalysis  Weakness - Plan: POCT urinalysis dipstick, Urine Culture, Urinalysis  Acute cystitis without hematuria - Plan: sulfamethoxazole-trimethoprim (BACTRIM DS) 800-160 MG tablet  -In office urine dipstick is positive for leukocytes.  Send Bactrim DS  for 5 days.   I discussed the assessment and treatment plan with the patient. The patient was provided an opportunity to ask questions and all were answered. The patient agreed with the plan and demonstrated an understanding of the instructions.   The patient was advised to call back or seek an in-person evaluation if the symptoms worsen or if the condition fails to improve as anticipated.    Lelon Frohlich, MD  [facility]

## 2022-04-14 NOTE — Telephone Encounter (Signed)
Virtual visit scheduled.  

## 2022-04-15 ENCOUNTER — Telehealth: Payer: PPO | Admitting: Family Medicine

## 2022-04-15 LAB — URINALYSIS, ROUTINE W REFLEX MICROSCOPIC
Bilirubin Urine: NEGATIVE
Hgb urine dipstick: NEGATIVE
Ketones, ur: NEGATIVE
Nitrite: NEGATIVE
RBC / HPF: NONE SEEN (ref 0–?)
Specific Gravity, Urine: <=1.005 — AB (ref 1.000–1.030)
Total Protein, Urine: NEGATIVE
Urine Glucose: NEGATIVE
Urobilinogen, UA: 0.2 (ref 0.0–1.0)
pH: 7 (ref 5.0–8.0)

## 2022-04-16 LAB — URINE CULTURE
MICRO NUMBER:: 13846992
SPECIMEN QUALITY:: ADEQUATE

## 2022-04-21 ENCOUNTER — Encounter: Payer: Self-pay | Admitting: Adult Health

## 2022-04-21 DIAGNOSIS — K5900 Constipation, unspecified: Secondary | ICD-10-CM | POA: Diagnosis not present

## 2022-04-21 DIAGNOSIS — I252 Old myocardial infarction: Secondary | ICD-10-CM | POA: Diagnosis not present

## 2022-04-21 DIAGNOSIS — G9341 Metabolic encephalopathy: Secondary | ICD-10-CM | POA: Diagnosis not present

## 2022-04-21 DIAGNOSIS — I2510 Atherosclerotic heart disease of native coronary artery without angina pectoris: Secondary | ICD-10-CM | POA: Diagnosis not present

## 2022-04-21 DIAGNOSIS — I69354 Hemiplegia and hemiparesis following cerebral infarction affecting left non-dominant side: Secondary | ICD-10-CM | POA: Diagnosis not present

## 2022-04-21 DIAGNOSIS — Z87891 Personal history of nicotine dependence: Secondary | ICD-10-CM | POA: Diagnosis not present

## 2022-04-21 DIAGNOSIS — C50912 Malignant neoplasm of unspecified site of left female breast: Secondary | ICD-10-CM | POA: Diagnosis not present

## 2022-04-21 DIAGNOSIS — G4089 Other seizures: Secondary | ICD-10-CM | POA: Diagnosis not present

## 2022-04-21 DIAGNOSIS — E785 Hyperlipidemia, unspecified: Secondary | ICD-10-CM | POA: Diagnosis not present

## 2022-04-21 DIAGNOSIS — G8929 Other chronic pain: Secondary | ICD-10-CM | POA: Diagnosis not present

## 2022-04-21 DIAGNOSIS — I5020 Unspecified systolic (congestive) heart failure: Secondary | ICD-10-CM | POA: Diagnosis not present

## 2022-04-21 DIAGNOSIS — Z86011 Personal history of benign neoplasm of the brain: Secondary | ICD-10-CM | POA: Diagnosis not present

## 2022-04-21 DIAGNOSIS — N390 Urinary tract infection, site not specified: Secondary | ICD-10-CM | POA: Diagnosis not present

## 2022-04-21 DIAGNOSIS — Z7902 Long term (current) use of antithrombotics/antiplatelets: Secondary | ICD-10-CM | POA: Diagnosis not present

## 2022-04-21 DIAGNOSIS — G931 Anoxic brain damage, not elsewhere classified: Secondary | ICD-10-CM | POA: Diagnosis not present

## 2022-04-21 DIAGNOSIS — I110 Hypertensive heart disease with heart failure: Secondary | ICD-10-CM | POA: Diagnosis not present

## 2022-04-21 DIAGNOSIS — F419 Anxiety disorder, unspecified: Secondary | ICD-10-CM | POA: Diagnosis not present

## 2022-04-21 DIAGNOSIS — M1990 Unspecified osteoarthritis, unspecified site: Secondary | ICD-10-CM | POA: Diagnosis not present

## 2022-04-22 ENCOUNTER — Encounter: Payer: Self-pay | Admitting: Internal Medicine

## 2022-04-22 ENCOUNTER — Other Ambulatory Visit: Payer: Self-pay | Admitting: Internal Medicine

## 2022-04-22 DIAGNOSIS — G8220 Paraplegia, unspecified: Secondary | ICD-10-CM

## 2022-04-23 DIAGNOSIS — C50912 Malignant neoplasm of unspecified site of left female breast: Secondary | ICD-10-CM | POA: Diagnosis not present

## 2022-04-23 DIAGNOSIS — E785 Hyperlipidemia, unspecified: Secondary | ICD-10-CM | POA: Diagnosis not present

## 2022-04-23 DIAGNOSIS — G8929 Other chronic pain: Secondary | ICD-10-CM | POA: Diagnosis not present

## 2022-04-23 DIAGNOSIS — I110 Hypertensive heart disease with heart failure: Secondary | ICD-10-CM | POA: Diagnosis not present

## 2022-04-23 DIAGNOSIS — F419 Anxiety disorder, unspecified: Secondary | ICD-10-CM | POA: Diagnosis not present

## 2022-04-23 DIAGNOSIS — G931 Anoxic brain damage, not elsewhere classified: Secondary | ICD-10-CM | POA: Diagnosis not present

## 2022-04-23 DIAGNOSIS — Z87891 Personal history of nicotine dependence: Secondary | ICD-10-CM | POA: Diagnosis not present

## 2022-04-23 DIAGNOSIS — Z7902 Long term (current) use of antithrombotics/antiplatelets: Secondary | ICD-10-CM | POA: Diagnosis not present

## 2022-04-23 DIAGNOSIS — G4089 Other seizures: Secondary | ICD-10-CM | POA: Diagnosis not present

## 2022-04-23 DIAGNOSIS — I252 Old myocardial infarction: Secondary | ICD-10-CM | POA: Diagnosis not present

## 2022-04-23 DIAGNOSIS — M1990 Unspecified osteoarthritis, unspecified site: Secondary | ICD-10-CM | POA: Diagnosis not present

## 2022-04-23 DIAGNOSIS — G9341 Metabolic encephalopathy: Secondary | ICD-10-CM | POA: Diagnosis not present

## 2022-04-23 DIAGNOSIS — Z86011 Personal history of benign neoplasm of the brain: Secondary | ICD-10-CM | POA: Diagnosis not present

## 2022-04-23 DIAGNOSIS — I69354 Hemiplegia and hemiparesis following cerebral infarction affecting left non-dominant side: Secondary | ICD-10-CM | POA: Diagnosis not present

## 2022-04-23 DIAGNOSIS — K5900 Constipation, unspecified: Secondary | ICD-10-CM | POA: Diagnosis not present

## 2022-04-23 DIAGNOSIS — I2510 Atherosclerotic heart disease of native coronary artery without angina pectoris: Secondary | ICD-10-CM | POA: Diagnosis not present

## 2022-04-23 DIAGNOSIS — I5020 Unspecified systolic (congestive) heart failure: Secondary | ICD-10-CM | POA: Diagnosis not present

## 2022-04-23 DIAGNOSIS — N390 Urinary tract infection, site not specified: Secondary | ICD-10-CM | POA: Diagnosis not present

## 2022-04-27 ENCOUNTER — Telehealth: Payer: Self-pay | Admitting: Internal Medicine

## 2022-04-27 DIAGNOSIS — C50912 Malignant neoplasm of unspecified site of left female breast: Secondary | ICD-10-CM | POA: Diagnosis not present

## 2022-04-27 DIAGNOSIS — G931 Anoxic brain damage, not elsewhere classified: Secondary | ICD-10-CM | POA: Diagnosis not present

## 2022-04-27 DIAGNOSIS — I110 Hypertensive heart disease with heart failure: Secondary | ICD-10-CM | POA: Diagnosis not present

## 2022-04-27 DIAGNOSIS — G4089 Other seizures: Secondary | ICD-10-CM | POA: Diagnosis not present

## 2022-04-27 DIAGNOSIS — I252 Old myocardial infarction: Secondary | ICD-10-CM | POA: Diagnosis not present

## 2022-04-27 DIAGNOSIS — Z7902 Long term (current) use of antithrombotics/antiplatelets: Secondary | ICD-10-CM | POA: Diagnosis not present

## 2022-04-27 DIAGNOSIS — F419 Anxiety disorder, unspecified: Secondary | ICD-10-CM | POA: Diagnosis not present

## 2022-04-27 DIAGNOSIS — I2510 Atherosclerotic heart disease of native coronary artery without angina pectoris: Secondary | ICD-10-CM | POA: Diagnosis not present

## 2022-04-27 DIAGNOSIS — N390 Urinary tract infection, site not specified: Secondary | ICD-10-CM | POA: Diagnosis not present

## 2022-04-27 DIAGNOSIS — G9341 Metabolic encephalopathy: Secondary | ICD-10-CM | POA: Diagnosis not present

## 2022-04-27 DIAGNOSIS — I5020 Unspecified systolic (congestive) heart failure: Secondary | ICD-10-CM | POA: Diagnosis not present

## 2022-04-27 DIAGNOSIS — E785 Hyperlipidemia, unspecified: Secondary | ICD-10-CM | POA: Diagnosis not present

## 2022-04-27 DIAGNOSIS — I69354 Hemiplegia and hemiparesis following cerebral infarction affecting left non-dominant side: Secondary | ICD-10-CM | POA: Diagnosis not present

## 2022-04-27 DIAGNOSIS — Z86011 Personal history of benign neoplasm of the brain: Secondary | ICD-10-CM | POA: Diagnosis not present

## 2022-04-27 DIAGNOSIS — G8929 Other chronic pain: Secondary | ICD-10-CM | POA: Diagnosis not present

## 2022-04-27 DIAGNOSIS — K5900 Constipation, unspecified: Secondary | ICD-10-CM | POA: Diagnosis not present

## 2022-04-27 DIAGNOSIS — Z87891 Personal history of nicotine dependence: Secondary | ICD-10-CM | POA: Diagnosis not present

## 2022-04-27 DIAGNOSIS — M1990 Unspecified osteoarthritis, unspecified site: Secondary | ICD-10-CM | POA: Diagnosis not present

## 2022-04-27 NOTE — Telephone Encounter (Signed)
Called patient from recall list. Spoke with spouse who stated they don't care to see Dr. Jerilynn Mages. Harl Bowie anymore. Removed from recall list.

## 2022-04-29 DIAGNOSIS — I252 Old myocardial infarction: Secondary | ICD-10-CM | POA: Diagnosis not present

## 2022-04-29 DIAGNOSIS — G9341 Metabolic encephalopathy: Secondary | ICD-10-CM | POA: Diagnosis not present

## 2022-04-29 DIAGNOSIS — E785 Hyperlipidemia, unspecified: Secondary | ICD-10-CM | POA: Diagnosis not present

## 2022-04-29 DIAGNOSIS — I69354 Hemiplegia and hemiparesis following cerebral infarction affecting left non-dominant side: Secondary | ICD-10-CM | POA: Diagnosis not present

## 2022-04-29 DIAGNOSIS — N390 Urinary tract infection, site not specified: Secondary | ICD-10-CM | POA: Diagnosis not present

## 2022-04-29 DIAGNOSIS — G4089 Other seizures: Secondary | ICD-10-CM | POA: Diagnosis not present

## 2022-04-29 DIAGNOSIS — I5020 Unspecified systolic (congestive) heart failure: Secondary | ICD-10-CM | POA: Diagnosis not present

## 2022-04-29 DIAGNOSIS — I2510 Atherosclerotic heart disease of native coronary artery without angina pectoris: Secondary | ICD-10-CM | POA: Diagnosis not present

## 2022-04-29 DIAGNOSIS — Z87891 Personal history of nicotine dependence: Secondary | ICD-10-CM | POA: Diagnosis not present

## 2022-04-29 DIAGNOSIS — F419 Anxiety disorder, unspecified: Secondary | ICD-10-CM | POA: Diagnosis not present

## 2022-04-29 DIAGNOSIS — Z86011 Personal history of benign neoplasm of the brain: Secondary | ICD-10-CM | POA: Diagnosis not present

## 2022-04-29 DIAGNOSIS — C50912 Malignant neoplasm of unspecified site of left female breast: Secondary | ICD-10-CM | POA: Diagnosis not present

## 2022-04-29 DIAGNOSIS — M1990 Unspecified osteoarthritis, unspecified site: Secondary | ICD-10-CM | POA: Diagnosis not present

## 2022-04-29 DIAGNOSIS — I110 Hypertensive heart disease with heart failure: Secondary | ICD-10-CM | POA: Diagnosis not present

## 2022-04-29 DIAGNOSIS — G8929 Other chronic pain: Secondary | ICD-10-CM | POA: Diagnosis not present

## 2022-04-29 DIAGNOSIS — Z7902 Long term (current) use of antithrombotics/antiplatelets: Secondary | ICD-10-CM | POA: Diagnosis not present

## 2022-04-29 DIAGNOSIS — G931 Anoxic brain damage, not elsewhere classified: Secondary | ICD-10-CM | POA: Diagnosis not present

## 2022-04-29 DIAGNOSIS — K5900 Constipation, unspecified: Secondary | ICD-10-CM | POA: Diagnosis not present

## 2022-05-06 ENCOUNTER — Encounter: Payer: Self-pay | Admitting: Adult Health

## 2022-05-06 ENCOUNTER — Ambulatory Visit: Payer: PPO | Admitting: Adult Health

## 2022-05-06 ENCOUNTER — Ambulatory Visit (INDEPENDENT_AMBULATORY_CARE_PROVIDER_SITE_OTHER): Payer: PPO

## 2022-05-06 VITALS — BP 143/80 | HR 60 | Ht 63.0 in | Wt 168.0 lb

## 2022-05-06 DIAGNOSIS — M8000XA Age-related osteoporosis with current pathological fracture, unspecified site, initial encounter for fracture: Secondary | ICD-10-CM

## 2022-05-06 DIAGNOSIS — R569 Unspecified convulsions: Secondary | ICD-10-CM

## 2022-05-06 DIAGNOSIS — Z23 Encounter for immunization: Secondary | ICD-10-CM

## 2022-05-06 MED ORDER — DENOSUMAB 60 MG/ML ~~LOC~~ SOSY
60.0000 mg | PREFILLED_SYRINGE | Freq: Once | SUBCUTANEOUS | Status: AC
  Administered 2022-05-06: 60 mg via SUBCUTANEOUS

## 2022-05-06 MED ORDER — LACOSAMIDE 100 MG PO TABS: 200.0000 mg | ORAL_TABLET | Freq: Two times a day (BID) | ORAL | 1 refills | Status: DC

## 2022-05-06 NOTE — Progress Notes (Signed)
Stacy Diaz is a 71 y.o. female presents to the office today for Prolia injection, per Dr Ledell Noss orders. Original order: Prolia '60mg'$  subcutaneous once Prolia '60mg'$  ,subcutaneous was administered right arm today. Patient tolerated injection. Patient due for follow up labs/provider appt: No.  Patient next injection due: 81month + 1 day, appt made [name]

## 2022-05-06 NOTE — Progress Notes (Signed)
PATIENT: Stacy Diaz DOB: 06/03/1951  REASON FOR VISIT: follow up HISTORY FROM: patient PRIMARY NEUROLOGIST:   Chief Complaint  Patient presents with   Rm 7    Here with husband for follow-up. Likely had seizure involving numbness in face about 10 days ago. Was in the hospital for UTI w/ sepsis Dec 22, 2021. Had two seizures Still getting home rehab. Had VV with Dr Leonie Man 01/2022.     HISTORY OF PRESENT ILLNESS: Today 05/06/22: [name] is a 71 year old female with a history of seizures.  She returns today for follow-up.  She is here today with her husband.  She reports that two weeks ago was complaining that left side of face was numb. Did not travel to arm like before. Lasted a few hours per husband.  She is currently taking Vimpat 100 mg in the morning and 200 mg in the evening and Keppra 500 mg twice a day.  She is in a motorized wheelchair.  She returns today for an evaluation.  HISTORY Patient requested this visit as she recently got home from prolonged hospital and rehab stay for seizures.71yo with a history of premature CVD and prior stroke, meningioma status post resection 1999 with spastic paraplegia requiring wheelchair, systolic CHF with a EF 70-35%, chronic pain, seizure disorder, breast cancer status post lumpectomy and radiation, and HTN who awoke the morning on 12/22/2021 unable to speak with possible left arm weakness.  Code stroke was initiated upon ER arrival but upon evaluation neurology felt her symptoms most likely represented acute metabolic encephalopathy instead of stroke.  CT head showed old encephalomalacia and cerebral brain and chronic white matter changes and old craniectomy changes.  EEG showed cortical dysfunction in the left central parietal region and moderate diffuse encephalopathy.  No definite seizure activity35.  Neurology felt she had generalized encephalopathy from infection in setting of poor cognitive reserve with worsening of old symptoms but  subsequently was noted to have focal seizures and started on Keppra initially and subsequently Vimpat was added.  She was found to have severe sepsis due to E. coli and Proteus urinary tract infection.  She was treated initially with Rocephin which was sensitive to both organisms subsequently switched to Keflex.  She was transferred to rehabilitation at [facility] which made gradual improvement and she is recently come home.  She states she had 1 more breakthrough seizure last Tuesday which was brief and did not last long.  She is tolerating Keppra finder milligram twice daily as well as Vimpat 100 mg twice daily without any significant side effects.  She feels she has not returned back to her baseline and she cannot multitask and her cognition is not as sharp as it used to be and she has some trouble finding words and has to think about the words.  She is scheduled to start home physical and Occupational Therapy soon.   REVIEW OF SYSTEMS: Out of a complete 14 system review of symptoms, the patient complains only of the following symptoms, and all other reviewed systems are negative.  ALLERGIES: No Known Allergies  HOME MEDICATIONS: Outpatient Medications Prior to Visit  Medication Sig Dispense Refill   ASPERCREME LIDOCAINE EX Apply 1 application topically daily as needed (muscle pain).     atorvastatin (LIPITOR) 10 MG tablet Take 1 tablet (10 mg total) by mouth daily at 6 PM. 90 tablet 1   baclofen (LIORESAL) 20 MG tablet TAKE ONE TABLET BY MOUTH FOUR TIMES DAILY 360 tablet 0  calcium carbonate (OS-CAL - DOSED IN MG OF ELEMENTAL CALCIUM) 1250 (500 Ca) MG tablet Take 1 tablet (500 mg of elemental calcium total) by mouth 2 (two) times daily with a meal.     carboxymethylcellulose (REFRESH PLUS) 0.5 % SOLN Place 1 drop into both eyes every morning.     clopidogrel (PLAVIX) 75 MG tablet TAKE ONE TABLET BY MOUTH ONE TIME DAILY 90 tablet 0   diazepam (VALIUM) 5 MG tablet TAKE ONE TABLET BY MOUTH ONE TIME  DAILY 90 tablet 0   Lacosamide 100 MG TABS Take 100 mg (1 tablet) in morning and 200 mg (two tablets) at night 270 tablet 1   levETIRAcetam (KEPPRA) 500 MG tablet Take 1 tablet (500 mg total) by mouth 2 (two) times daily. 180 tablet 3   lisinopril (ZESTRIL) 40 MG tablet TAKE ONE TABLET BY MOUTH ONE TIME DAILY 90 tablet 0   liver oil-zinc oxide (DESITIN) 40 % ointment Apply 1 application topically at bedtime. Apply to buttocks     melatonin 5 MG TABS Take 5 mg by mouth at bedtime.     metoprolol tartrate (LOPRESSOR) 100 MG tablet Take 1 tablet (100 mg total) by mouth 2 (two) times daily. 180 tablet 1   Multiple Vitamin (MULTIVITAMIN WITH MINERALS) TABS tablet Take 1 tablet by mouth every morning.      oxyCODONE 10 MG TABS Take 1 tablet (10 mg total) by mouth every 8 (eight) hours as needed for moderate pain or severe pain. 30 tablet 0   polyethylene glycol (MIRALAX / GLYCOLAX) packet Take 17 g by mouth once a week. Mix with liquid and drink     No facility-administered medications prior to visit.    PAST MEDICAL HISTORY: Past Medical History:  Diagnosis Date   Anxiety    Arthritis    "probably" (08/25/2012)   Breast cancer of lower-outer quadrant of left female breast ([place]) 05/25/2014   Headache(784.0)    "not real frequent" (08/25/2012)   Heart disease    HTN (hypertension)    Hyperlipidemia    Meningioma ([place]) 1999   Myocardial infarction ([place]) 1987   Paresis of lower extremity ([place]) 1999   BLE/notes 08/25/2012   S/P radiation therapy 07/30/14-08/30/13   left breast cancer/50Gy   Seizures ([place])    no seizures since 2014   Spastic paraparesis    S/P meningioma resection 1999/notes 08/25/2012   Stroke ([place])    x 2, the last 2014    PAST SURGICAL HISTORY: Past Surgical History:  Procedure Laterality Date   BRAIN MENINGIOMA EXCISION  05/1997   BREAST LUMPECTOMY WITH NEEDLE LOCALIZATION AND AXILLARY SENTINEL LYMPH NODE BX Left 06/22/2014   Procedure: LEFT [place];  Surgeon: Autumn Messing III, MD;  Location: [facility];  Service: General;  Laterality: Left;   [place]   denied intervention: "blockage wasn't bad enough"   LEFT HEART CATH AND CORONARY ANGIOGRAPHY N/A 07/08/2021   Procedure: LEFT HEART CATH AND CORONARY ANGIOGRAPHY;  Surgeon: Troy Sine, MD;  Location: [facility];  Service: Cardiovascular;  Laterality: N/A;   TIBIA IM NAIL INSERTION Right 04/26/2018   Procedure: INTRAMEDULLARY (IM) NAIL TIBIAL;  Surgeon: Altamese Loretto, MD;  Location: [facility];  Service: Orthopedics;  Laterality: Right;    FAMILY HISTORY: Family History  Problem Relation Age of Onset   Breast cancer Mother 62       unilateral   Heart disease  Father    Breast cancer Paternal Grandmother 81       unilateral    SOCIAL HISTORY: Social History   Socioeconomic History   Marital status: Married    Spouse name: david   Number of children: 2   Years of education: college   Highest education level: Bachelor's degree (e.g., BA, AB, BS)  Occupational History   Occupation: disabled  Tobacco Use   Smoking status: Former    Packs/day: 2.00    Years: 10.00    Total pack years: 20.00    Types: Cigarettes   Smokeless tobacco: Never   Tobacco comments:    08/25/2012 "quit smoking cigarettes in 1985 when I had my heart attack"  Vaping Use   Vaping Use: Never used  Substance and Sexual Activity   Alcohol use: No    Alcohol/week: 7.0 standard drinks of alcohol    Types: 7 Glasses of wine per week    Comment: occasionally   Drug use: No   Sexual activity: Never  Other Topics Concern   Not on file  Social History Narrative   Not on file   Social Determinants of Health   Financial Resource Strain: Medium Risk (08/17/2021)   Overall Financial Resource Strain (CARDIA)    Difficulty of Paying Living Expenses: Somewhat hard  Food Insecurity: No Food Insecurity (08/17/2021)   Hunger Vital Sign     Worried About Running Out of Food in the Last Year: Never true    Ran Out of Food in the Last Year: Never true  Transportation Needs: No Transportation Needs (08/17/2021)   PRAPARE - Hydrologist (Medical): No    Lack of Transportation (Non-Medical): No  Physical Activity: Unknown (08/17/2021)   Exercise Vital Sign    Days of Exercise per Week: 0 days    Minutes of Exercise per Session: Not on file  Stress: Stress Concern Present (08/17/2021)   [place]    Feeling of Stress : Rather much  Social Connections: Unknown (08/17/2021)   Social Connection and Isolation Panel [NHANES]    Frequency of Communication with Friends and Family: Three times a week    Frequency of Social Gatherings with Friends and Family: Once a week    Attends Religious Services: Patient refused    Marine scientist or Organizations: Yes    Attends Archivist Meetings: Patient refused    Marital Status: Married  Human resources officer Violence: Not on file      PHYSICAL EXAM  Vitals:   05/06/22 1453  BP: (!) 143/80  Pulse: 60  Weight: 168 lb (76.2 kg)  Height: '5\' 3"'$  (1.6 m)   Body mass index is 29.76 kg/m.  Generalized: Well developed, in no acute distress   Neurological examination  Mentation: Alert oriented to time, place, history taking. Follows all commands speech and language fluent Cranial nerve II-XII: Pupils were equal round reactive to light. Extraocular movements were full, visual field were full on confrontational test. Facial sensation and strength were normal.  Head turning and shoulder shrug  were normal and symmetric. Motor: The motor testing reveals 5 over 5 strength in the right upper extremity.  4/5 strength in the left upper extremity.  No movement in the lower extremities Sensory: Sensory testing is intact to soft touch on all 4 extremities. No evidence of extinction is noted.   Coordination: Cerebellar testing reveals good finger-nose-finger  Gait and station: In a motorized  wheelchair.     DIAGNOSTIC DATA (LABS, IMAGING, TESTING) - I reviewed patient records, labs, notes, testing and imaging myself where available.  Lab Results  Component Value Date   WBC 8.9 12/28/2021   HGB 13.0 12/28/2021   HCT 37.5 12/28/2021   MCV 91.5 12/28/2021   PLT 284 12/28/2021      Component Value Date/Time   NA 133 (L) 12/28/2021 0138   NA 128 (L) 07/04/2021 1459   NA 143 05/30/2014 1212   K 4.2 12/28/2021 0138   K 3.5 05/30/2014 1212   CL 101 12/28/2021 0138   CO2 24 12/28/2021 0138   CO2 28 05/30/2014 1212   GLUCOSE 123 (H) 12/28/2021 0138   GLUCOSE 109 05/30/2014 1212   BUN 13 12/28/2021 0138   BUN 7 (L) 07/04/2021 1459   BUN 14.6 05/30/2014 1212   CREATININE 0.63 12/28/2021 0138   CREATININE 0.67 09/25/2020 1403   CREATININE 0.56 03/08/2020 1524   CREATININE 0.7 05/30/2014 1212   CALCIUM 9.1 12/28/2021 0138   CALCIUM 8.9 04/28/2018 0346   CALCIUM 9.9 05/30/2014 1212   PROT 6.2 (L) 12/24/2021 0306   PROT 7.1 05/30/2014 1212   ALBUMIN 3.7 12/24/2021 0306   ALBUMIN 3.8 05/30/2014 1212   AST 35 12/24/2021 0306   AST 21 09/25/2020 1403   AST 26 05/30/2014 1212   ALT 26 12/24/2021 0306   ALT 22 09/25/2020 1403   ALT 34 05/30/2014 1212   ALKPHOS 43 12/24/2021 0306   ALKPHOS 75 05/30/2014 1212   BILITOT 0.8 12/24/2021 0306   BILITOT 0.6 09/25/2020 1403   BILITOT 0.49 05/30/2014 1212   GFRNONAA >60 12/28/2021 0138   GFRNONAA >60 09/25/2020 1403   GFRAA >60 09/13/2019 1016   Lab Results  Component Value Date   CHOL 145 04/26/2021   HDL 66 04/26/2021   LDLCALC 67 04/26/2021   LDLDIRECT 142.6 03/26/2011   TRIG 62 04/26/2021   CHOLHDL 2.2 04/26/2021   Lab Results  Component Value Date   HGBA1C 5.2 04/26/2021   Lab Results  Component Value Date   VITAMINB12 453 03/08/2020   Lab Results  Component Value Date   TSH 0.372 12/22/2021       ASSESSMENT AND PLAN 71 y.o. 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 here with:  1.  Focal seizures  -Increase Vimpat to 200 mg twice a day -Continue Keppra 500 mg twice a day -If if she has any additional seizure-like events she should let us know -Follow-up in 6 months or sooner if needed     Ward Givens, MSN, NP-C 05/06/2022, 3:08 PM [facility] [address] [place], [place] [address] [phone]

## 2022-05-07 DIAGNOSIS — Z86011 Personal history of benign neoplasm of the brain: Secondary | ICD-10-CM | POA: Diagnosis not present

## 2022-05-07 DIAGNOSIS — E785 Hyperlipidemia, unspecified: Secondary | ICD-10-CM | POA: Diagnosis not present

## 2022-05-07 DIAGNOSIS — F419 Anxiety disorder, unspecified: Secondary | ICD-10-CM | POA: Diagnosis not present

## 2022-05-07 DIAGNOSIS — G931 Anoxic brain damage, not elsewhere classified: Secondary | ICD-10-CM | POA: Diagnosis not present

## 2022-05-07 DIAGNOSIS — G9341 Metabolic encephalopathy: Secondary | ICD-10-CM | POA: Diagnosis not present

## 2022-05-07 DIAGNOSIS — K5900 Constipation, unspecified: Secondary | ICD-10-CM | POA: Diagnosis not present

## 2022-05-07 DIAGNOSIS — Z7902 Long term (current) use of antithrombotics/antiplatelets: Secondary | ICD-10-CM | POA: Diagnosis not present

## 2022-05-07 DIAGNOSIS — I69354 Hemiplegia and hemiparesis following cerebral infarction affecting left non-dominant side: Secondary | ICD-10-CM | POA: Diagnosis not present

## 2022-05-07 DIAGNOSIS — I252 Old myocardial infarction: Secondary | ICD-10-CM | POA: Diagnosis not present

## 2022-05-07 DIAGNOSIS — I5020 Unspecified systolic (congestive) heart failure: Secondary | ICD-10-CM | POA: Diagnosis not present

## 2022-05-07 DIAGNOSIS — I110 Hypertensive heart disease with heart failure: Secondary | ICD-10-CM | POA: Diagnosis not present

## 2022-05-07 DIAGNOSIS — I2510 Atherosclerotic heart disease of native coronary artery without angina pectoris: Secondary | ICD-10-CM | POA: Diagnosis not present

## 2022-05-07 DIAGNOSIS — N390 Urinary tract infection, site not specified: Secondary | ICD-10-CM | POA: Diagnosis not present

## 2022-05-07 DIAGNOSIS — M1990 Unspecified osteoarthritis, unspecified site: Secondary | ICD-10-CM | POA: Diagnosis not present

## 2022-05-07 DIAGNOSIS — G8929 Other chronic pain: Secondary | ICD-10-CM | POA: Diagnosis not present

## 2022-05-07 DIAGNOSIS — C50912 Malignant neoplasm of unspecified site of left female breast: Secondary | ICD-10-CM | POA: Diagnosis not present

## 2022-05-07 DIAGNOSIS — Z87891 Personal history of nicotine dependence: Secondary | ICD-10-CM | POA: Diagnosis not present

## 2022-05-07 DIAGNOSIS — G4089 Other seizures: Secondary | ICD-10-CM | POA: Diagnosis not present

## 2022-05-09 NOTE — Progress Notes (Signed)
I agree with the above plan 

## 2022-05-12 ENCOUNTER — Other Ambulatory Visit: Payer: Self-pay | Admitting: Internal Medicine

## 2022-05-12 DIAGNOSIS — I10 Essential (primary) hypertension: Secondary | ICD-10-CM

## 2022-05-13 DIAGNOSIS — G931 Anoxic brain damage, not elsewhere classified: Secondary | ICD-10-CM | POA: Diagnosis not present

## 2022-05-13 DIAGNOSIS — F419 Anxiety disorder, unspecified: Secondary | ICD-10-CM | POA: Diagnosis not present

## 2022-05-13 DIAGNOSIS — K5900 Constipation, unspecified: Secondary | ICD-10-CM | POA: Diagnosis not present

## 2022-05-13 DIAGNOSIS — M1990 Unspecified osteoarthritis, unspecified site: Secondary | ICD-10-CM | POA: Diagnosis not present

## 2022-05-13 DIAGNOSIS — I252 Old myocardial infarction: Secondary | ICD-10-CM | POA: Diagnosis not present

## 2022-05-13 DIAGNOSIS — I69354 Hemiplegia and hemiparesis following cerebral infarction affecting left non-dominant side: Secondary | ICD-10-CM | POA: Diagnosis not present

## 2022-05-13 DIAGNOSIS — E785 Hyperlipidemia, unspecified: Secondary | ICD-10-CM | POA: Diagnosis not present

## 2022-05-13 DIAGNOSIS — G9341 Metabolic encephalopathy: Secondary | ICD-10-CM | POA: Diagnosis not present

## 2022-05-13 DIAGNOSIS — N390 Urinary tract infection, site not specified: Secondary | ICD-10-CM | POA: Diagnosis not present

## 2022-05-13 DIAGNOSIS — I5020 Unspecified systolic (congestive) heart failure: Secondary | ICD-10-CM | POA: Diagnosis not present

## 2022-05-13 DIAGNOSIS — Z87891 Personal history of nicotine dependence: Secondary | ICD-10-CM | POA: Diagnosis not present

## 2022-05-13 DIAGNOSIS — Z7902 Long term (current) use of antithrombotics/antiplatelets: Secondary | ICD-10-CM | POA: Diagnosis not present

## 2022-05-13 DIAGNOSIS — Z86011 Personal history of benign neoplasm of the brain: Secondary | ICD-10-CM | POA: Diagnosis not present

## 2022-05-13 DIAGNOSIS — C50912 Malignant neoplasm of unspecified site of left female breast: Secondary | ICD-10-CM | POA: Diagnosis not present

## 2022-05-13 DIAGNOSIS — G8929 Other chronic pain: Secondary | ICD-10-CM | POA: Diagnosis not present

## 2022-05-13 DIAGNOSIS — I2510 Atherosclerotic heart disease of native coronary artery without angina pectoris: Secondary | ICD-10-CM | POA: Diagnosis not present

## 2022-05-13 DIAGNOSIS — G4089 Other seizures: Secondary | ICD-10-CM | POA: Diagnosis not present

## 2022-05-13 DIAGNOSIS — I110 Hypertensive heart disease with heart failure: Secondary | ICD-10-CM | POA: Diagnosis not present

## 2022-05-19 DIAGNOSIS — K5909 Other constipation: Secondary | ICD-10-CM | POA: Diagnosis not present

## 2022-05-20 DIAGNOSIS — C50912 Malignant neoplasm of unspecified site of left female breast: Secondary | ICD-10-CM | POA: Diagnosis not present

## 2022-05-20 DIAGNOSIS — G931 Anoxic brain damage, not elsewhere classified: Secondary | ICD-10-CM | POA: Diagnosis not present

## 2022-05-20 DIAGNOSIS — F419 Anxiety disorder, unspecified: Secondary | ICD-10-CM | POA: Diagnosis not present

## 2022-05-20 DIAGNOSIS — M1990 Unspecified osteoarthritis, unspecified site: Secondary | ICD-10-CM | POA: Diagnosis not present

## 2022-05-20 DIAGNOSIS — G8929 Other chronic pain: Secondary | ICD-10-CM | POA: Diagnosis not present

## 2022-05-20 DIAGNOSIS — G4089 Other seizures: Secondary | ICD-10-CM | POA: Diagnosis not present

## 2022-05-20 DIAGNOSIS — N390 Urinary tract infection, site not specified: Secondary | ICD-10-CM | POA: Diagnosis not present

## 2022-05-20 DIAGNOSIS — E785 Hyperlipidemia, unspecified: Secondary | ICD-10-CM | POA: Diagnosis not present

## 2022-05-20 DIAGNOSIS — Z86011 Personal history of benign neoplasm of the brain: Secondary | ICD-10-CM | POA: Diagnosis not present

## 2022-05-20 DIAGNOSIS — I5020 Unspecified systolic (congestive) heart failure: Secondary | ICD-10-CM | POA: Diagnosis not present

## 2022-05-20 DIAGNOSIS — I2510 Atherosclerotic heart disease of native coronary artery without angina pectoris: Secondary | ICD-10-CM | POA: Diagnosis not present

## 2022-05-20 DIAGNOSIS — I252 Old myocardial infarction: Secondary | ICD-10-CM | POA: Diagnosis not present

## 2022-05-20 DIAGNOSIS — Z87891 Personal history of nicotine dependence: Secondary | ICD-10-CM | POA: Diagnosis not present

## 2022-05-20 DIAGNOSIS — I110 Hypertensive heart disease with heart failure: Secondary | ICD-10-CM | POA: Diagnosis not present

## 2022-05-20 DIAGNOSIS — G9341 Metabolic encephalopathy: Secondary | ICD-10-CM | POA: Diagnosis not present

## 2022-05-20 DIAGNOSIS — K5900 Constipation, unspecified: Secondary | ICD-10-CM | POA: Diagnosis not present

## 2022-05-20 DIAGNOSIS — Z7902 Long term (current) use of antithrombotics/antiplatelets: Secondary | ICD-10-CM | POA: Diagnosis not present

## 2022-05-20 DIAGNOSIS — I69354 Hemiplegia and hemiparesis following cerebral infarction affecting left non-dominant side: Secondary | ICD-10-CM | POA: Diagnosis not present

## 2022-05-29 DIAGNOSIS — E785 Hyperlipidemia, unspecified: Secondary | ICD-10-CM | POA: Diagnosis not present

## 2022-05-29 DIAGNOSIS — I110 Hypertensive heart disease with heart failure: Secondary | ICD-10-CM | POA: Diagnosis not present

## 2022-05-29 DIAGNOSIS — I69354 Hemiplegia and hemiparesis following cerebral infarction affecting left non-dominant side: Secondary | ICD-10-CM | POA: Diagnosis not present

## 2022-05-29 DIAGNOSIS — F419 Anxiety disorder, unspecified: Secondary | ICD-10-CM | POA: Diagnosis not present

## 2022-05-29 DIAGNOSIS — I5020 Unspecified systolic (congestive) heart failure: Secondary | ICD-10-CM | POA: Diagnosis not present

## 2022-05-29 DIAGNOSIS — G4089 Other seizures: Secondary | ICD-10-CM | POA: Diagnosis not present

## 2022-05-29 DIAGNOSIS — G9341 Metabolic encephalopathy: Secondary | ICD-10-CM | POA: Diagnosis not present

## 2022-05-29 DIAGNOSIS — Z7902 Long term (current) use of antithrombotics/antiplatelets: Secondary | ICD-10-CM | POA: Diagnosis not present

## 2022-05-29 DIAGNOSIS — K5900 Constipation, unspecified: Secondary | ICD-10-CM | POA: Diagnosis not present

## 2022-05-29 DIAGNOSIS — C50912 Malignant neoplasm of unspecified site of left female breast: Secondary | ICD-10-CM | POA: Diagnosis not present

## 2022-05-29 DIAGNOSIS — M1990 Unspecified osteoarthritis, unspecified site: Secondary | ICD-10-CM | POA: Diagnosis not present

## 2022-05-29 DIAGNOSIS — Z87891 Personal history of nicotine dependence: Secondary | ICD-10-CM | POA: Diagnosis not present

## 2022-05-29 DIAGNOSIS — G931 Anoxic brain damage, not elsewhere classified: Secondary | ICD-10-CM | POA: Diagnosis not present

## 2022-05-29 DIAGNOSIS — I2510 Atherosclerotic heart disease of native coronary artery without angina pectoris: Secondary | ICD-10-CM | POA: Diagnosis not present

## 2022-05-29 DIAGNOSIS — Z86011 Personal history of benign neoplasm of the brain: Secondary | ICD-10-CM | POA: Diagnosis not present

## 2022-05-29 DIAGNOSIS — I252 Old myocardial infarction: Secondary | ICD-10-CM | POA: Diagnosis not present

## 2022-05-29 DIAGNOSIS — N390 Urinary tract infection, site not specified: Secondary | ICD-10-CM | POA: Diagnosis not present

## 2022-05-29 DIAGNOSIS — G8929 Other chronic pain: Secondary | ICD-10-CM | POA: Diagnosis not present

## 2022-05-31 ENCOUNTER — Encounter: Payer: Self-pay | Admitting: Internal Medicine

## 2022-06-01 MED ORDER — OXYCODONE HCL 10 MG PO TABS: 10.0000 mg | ORAL_TABLET | Freq: Three times a day (TID) | ORAL | 0 refills | Status: DC | PRN

## 2022-06-21 ENCOUNTER — Other Ambulatory Visit: Payer: Self-pay | Admitting: Internal Medicine

## 2022-06-21 DIAGNOSIS — I10 Essential (primary) hypertension: Secondary | ICD-10-CM

## 2022-06-27 ENCOUNTER — Other Ambulatory Visit: Payer: Self-pay | Admitting: Internal Medicine

## 2022-06-27 DIAGNOSIS — Z8673 Personal history of transient ischemic attack (TIA), and cerebral infarction without residual deficits: Secondary | ICD-10-CM

## 2022-06-27 DIAGNOSIS — G8220 Paraplegia, unspecified: Secondary | ICD-10-CM

## 2022-07-15 ENCOUNTER — Encounter: Payer: Self-pay | Admitting: Internal Medicine

## 2022-07-15 ENCOUNTER — Encounter: Payer: Self-pay | Admitting: Neurology

## 2022-07-23 ENCOUNTER — Other Ambulatory Visit: Payer: Self-pay | Admitting: Internal Medicine

## 2022-07-23 ENCOUNTER — Telehealth: Payer: Self-pay | Admitting: Adult Health

## 2022-07-23 DIAGNOSIS — G8220 Paraplegia, unspecified: Secondary | ICD-10-CM

## 2022-07-23 NOTE — Telephone Encounter (Signed)
See phone note

## 2022-07-23 NOTE — Telephone Encounter (Signed)
Pt spouse is calling. Said he sent a [employer] last week and never got a response or a phone call from nurse. He is requesting a call back from nurse. Stated pt is still having seizures and had been in bed for the last two and half week.

## 2022-07-23 NOTE — Telephone Encounter (Signed)
Husband is aware to take the patient to the ED.  Husband verbally agreed. Awaiting patient's arrival at the ED.

## 2022-07-23 NOTE — Telephone Encounter (Signed)
I called and spoke to husband.  Husband stated he sent message to [place], but did not hear back. I stated did see message (not sure what happened and would have to look but asked him what was going on. He stated that pt had episode about 2 wks ago, where pts R arm would not move.  He stated lasted about 10 hours, then fingers/hands came back , arm has limited movement, no strength, pt is lucid, speak, did she have any infections?  He did not think so.   She not at her normal baseline.  Also has had headaches since then as well.  She has been taking her medications keppra '500mg'$  po bid, and vimpat '200mg'$  po bid.  He did contact pcp at the time was told thought was sz and contact neuro.  I relayed that from what he was telling me that not sure if seizure or could be stroke.  Since she is bedbound and he can't get her oob, I recommended that hard for [place] to say what is going on, she needs to be seen and evaluated at ED.  He stated that he is not taking her to ED.  I told him I would check with provider, and MM/NP and PS/MD not in office.  I called him back after reading that she is having issue with cough and pcp stating that she may have PNA.  Recommended calling EMS to take her for evaluation.  He said he would.

## 2022-07-27 ENCOUNTER — Emergency Department (HOSPITAL_COMMUNITY): Payer: PPO

## 2022-07-27 ENCOUNTER — Emergency Department (HOSPITAL_COMMUNITY)
Admission: EM | Admit: 2022-07-27 | Discharge: 2022-07-28 | Disposition: A | Discharge status: Home / Self Care | Payer: PPO | Attending: Emergency Medicine | Admitting: Emergency Medicine

## 2022-07-27 ENCOUNTER — Encounter (HOSPITAL_COMMUNITY): Payer: Self-pay

## 2022-07-27 ENCOUNTER — Other Ambulatory Visit: Payer: Self-pay

## 2022-07-27 DIAGNOSIS — Z87891 Personal history of nicotine dependence: Secondary | ICD-10-CM | POA: Insufficient documentation

## 2022-07-27 DIAGNOSIS — J069 Acute upper respiratory infection, unspecified: Secondary | ICD-10-CM

## 2022-07-27 DIAGNOSIS — I10 Essential (primary) hypertension: Secondary | ICD-10-CM | POA: Diagnosis not present

## 2022-07-27 DIAGNOSIS — R059 Cough, unspecified: Secondary | ICD-10-CM | POA: Diagnosis not present

## 2022-07-27 DIAGNOSIS — R0989 Other specified symptoms and signs involving the circulatory and respiratory systems: Secondary | ICD-10-CM | POA: Diagnosis not present

## 2022-07-27 DIAGNOSIS — Z79899 Other long term (current) drug therapy: Secondary | ICD-10-CM | POA: Insufficient documentation

## 2022-07-27 DIAGNOSIS — R0602 Shortness of breath: Secondary | ICD-10-CM | POA: Insufficient documentation

## 2022-07-27 DIAGNOSIS — N3000 Acute cystitis without hematuria: Secondary | ICD-10-CM | POA: Diagnosis not present

## 2022-07-27 DIAGNOSIS — Z20822 Contact with and (suspected) exposure to covid-19: Secondary | ICD-10-CM | POA: Diagnosis not present

## 2022-07-27 DIAGNOSIS — Z853 Personal history of malignant neoplasm of breast: Secondary | ICD-10-CM | POA: Insufficient documentation

## 2022-07-27 DIAGNOSIS — B9789 Other viral agents as the cause of diseases classified elsewhere: Secondary | ICD-10-CM | POA: Diagnosis not present

## 2022-07-27 LAB — COMPREHENSIVE METABOLIC PANEL
ALT: 31 U/L (ref 0–44)
AST: 24 U/L (ref 15–41)
Albumin: 3.6 g/dL (ref 3.5–5.0)
Alkaline Phosphatase: 75 U/L (ref 38–126)
Anion gap: 11 (ref 5–15)
BUN: 9 mg/dL (ref 8–23)
CO2: 22 mmol/L (ref 22–32)
Calcium: 9.3 mg/dL (ref 8.9–10.3)
Chloride: 105 mmol/L (ref 98–111)
Creatinine, Ser: 0.57 mg/dL (ref 0.44–1.00)
GFR, Estimated: >60 mL/min (ref >60)
Glucose, Bld: 101 mg/dL — ABNORMAL HIGH (ref 70–99)
Potassium: 4.1 mmol/L (ref 3.5–5.1)
Sodium: 138 mmol/L (ref 135–145)
Total Bilirubin: 0.4 mg/dL (ref 0.3–1.2)
Total Protein: 7.5 g/dL (ref 6.5–8.1)

## 2022-07-27 LAB — RESP PANEL BY RT-PCR (RSV, FLU A&B, COVID)  RVPGX2
Influenza A by PCR: NEGATIVE
Influenza B by PCR: NEGATIVE
Resp Syncytial Virus by PCR: NEGATIVE
SARS Coronavirus 2 by RT PCR: NEGATIVE

## 2022-07-27 LAB — CBC WITH DIFFERENTIAL/PLATELET
Abs Immature Granulocytes: 0.05 10*3/uL (ref 0.00–0.07)
Basophils Absolute: 0.1 10*3/uL (ref 0.0–0.1)
Basophils Relative: 0 %
Eosinophils Absolute: 0.1 10*3/uL (ref 0.0–0.5)
Eosinophils Relative: 1 %
HCT: 48.6 % — ABNORMAL HIGH (ref 36.0–46.0)
Hemoglobin: 15.3 g/dL — ABNORMAL HIGH (ref 12.0–15.0)
Immature Granulocytes: 0 %
Lymphocytes Relative: 30 %
Lymphs Abs: 3.7 10*3/uL (ref 0.7–4.0)
MCH: 30.6 pg (ref 26.0–34.0)
MCHC: 31.5 g/dL (ref 30.0–36.0)
MCV: 97.2 fL (ref 80.0–100.0)
Monocytes Absolute: 1.1 10*3/uL — ABNORMAL HIGH (ref 0.1–1.0)
Monocytes Relative: 9 %
Neutro Abs: 7.4 10*3/uL (ref 1.7–7.7)
Neutrophils Relative %: 60 %
Platelets: 258 10*3/uL (ref 150–400)
RBC: 5 MIL/uL (ref 3.87–5.11)
RDW: 12.8 % (ref 11.5–15.5)
WBC: 12.5 10*3/uL — ABNORMAL HIGH (ref 4.0–10.5)
nRBC: 0 % (ref 0.0–0.2)

## 2022-07-27 NOTE — Telephone Encounter (Signed)
I called and husband picked up phone.  He stated that he had the EMS there now for pt.  Asked if could speak to him later and I said sure no problem.

## 2022-07-27 NOTE — ED Provider Triage Note (Signed)
Emergency Medicine Provider Triage Evaluation Note  Stacy Diaz , a 71 y.o. female  was evaluated in triage.  Pt complains of dry persistent coughx2 weeks. No chest pain or SOB. Increased fatigue. Wheelchair bound. Not out of bed for the last 3 weeks.  Review of Systems  Positive: cough Negative: Chest pain, SOB  Physical Exam  BP 124/66 (BP Location: Right Arm)   Pulse (!) 58   Temp 99.3 F (37.4 C) (Oral)   Resp 18   Ht '5\' 2"'$  (1.575 m)   Wt 76.2 kg   SpO2 98%   BMI 30.73 kg/m  Gen:   Awake, no distress   Resp:  Normal effort  MSK:   Moves extremities without difficulty  Other:  +crackles in RLL  Medical Decision Making  Medically screening exam initiated at 1:16 PM.  Appropriate orders placed.  Stacy Diaz was informed that the remainder of the evaluation will be completed by another provider, this initial triage assessment does not replace that evaluation, and the importance of remaining in the ED until their evaluation is complete.     Osvaldo Shipper, [place] 07/27/22 1320

## 2022-07-27 NOTE — ED Triage Notes (Signed)
Pt BIB [facility] from home c/o a cough and cold symptoms x2 weeks. Pt has had general malaise x4 weeks and has not been out of bed in 3 weeks.

## 2022-07-27 NOTE — ED Notes (Signed)
Husband, [name], requests a call with patient update, please.  [phone]

## 2022-07-28 ENCOUNTER — Encounter (HOSPITAL_COMMUNITY): Payer: Self-pay | Admitting: Emergency Medicine

## 2022-07-28 DIAGNOSIS — R531 Weakness: Secondary | ICD-10-CM | POA: Diagnosis not present

## 2022-07-28 DIAGNOSIS — Z7401 Bed confinement status: Secondary | ICD-10-CM | POA: Diagnosis not present

## 2022-07-28 DIAGNOSIS — Z743 Need for continuous supervision: Secondary | ICD-10-CM | POA: Diagnosis not present

## 2022-07-28 LAB — URINALYSIS, ROUTINE W REFLEX MICROSCOPIC
Bilirubin Urine: NEGATIVE
Glucose, UA: NEGATIVE mg/dL
Ketones, ur: 5 mg/dL — AB
Nitrite: NEGATIVE
Protein, ur: NEGATIVE mg/dL
Specific Gravity, Urine: 1.008 (ref 1.005–1.030)
WBC, UA: >50 WBC/hpf — ABNORMAL HIGH (ref 0–5)
pH: 7 (ref 5.0–8.0)

## 2022-07-28 MED ORDER — GUAIFENESIN 100 MG/5ML PO LIQD: 5.0000 mL | ORAL | 0 refills | Status: AC | PRN

## 2022-07-28 MED ORDER — FLUTICASONE PROPIONATE 50 MCG/ACT NA SUSP: 1.0000 | Freq: Every day | NASAL | 0 refills | Status: DC

## 2022-07-28 MED ORDER — CEPHALEXIN 250 MG PO CAPS
500.0000 mg | ORAL_CAPSULE | Freq: Once | ORAL | Status: AC
  Administered 2022-07-28: 500 mg via ORAL
  Filled 2022-07-28: qty 2

## 2022-07-28 MED ORDER — LACOSAMIDE 50 MG PO TABS
200.0000 mg | ORAL_TABLET | Freq: Once | ORAL | Status: AC
  Administered 2022-07-28: 200 mg via ORAL
  Filled 2022-07-28: qty 4

## 2022-07-28 MED ORDER — CEPHALEXIN 500 MG PO CAPS: 500.0000 mg | ORAL_CAPSULE | Freq: Three times a day (TID) | ORAL | 0 refills | Status: DC

## 2022-07-28 MED ORDER — FLUTICASONE PROPIONATE 50 MCG/ACT NA SUSP: 1.0000 | Freq: Every day | NASAL | 0 refills | Status: AC

## 2022-07-28 MED ORDER — CEPHALEXIN 500 MG PO CAPS: 500.0000 mg | ORAL_CAPSULE | Freq: Three times a day (TID) | ORAL | 0 refills | Status: AC

## 2022-07-28 MED ORDER — GUAIFENESIN 100 MG/5ML PO LIQD: 5.0000 mL | ORAL | 0 refills | Status: DC | PRN

## 2022-07-28 MED ORDER — GUAIFENESIN 100 MG/5ML PO LIQD
5.0000 mL | Freq: Once | ORAL | Status: AC
  Administered 2022-07-28: 5 mL via ORAL
  Filled 2022-07-28: qty 5

## 2022-07-28 MED ORDER — LEVETIRACETAM 500 MG PO TABS
500.0000 mg | ORAL_TABLET | Freq: Once | ORAL | Status: AC
  Administered 2022-07-28: 500 mg via ORAL
  Filled 2022-07-28: qty 1

## 2022-07-28 NOTE — Telephone Encounter (Signed)
I called husband.  Pt back home after a long ED visit (in recliner/ hallway).  UTI diagnosis.  Pt is resting.  No other sz like the one 3 wks ago, (where she could not move R arm).  She is able to move above her head, but fine motor skills less then her normal.  Husband says her numbness/tingling that she experiences are sz/.  She is ill with UTI and cough.  ( decreased sz threshold may have caused sz).  No changes in her sz meds.  Vimpat '200mg'$  po bid and keprra '500mg'$  po bid.  No lab levels done.  I relayed that will forward message to MM/NP and if any changes or orders for her will let him know.  Pt is homebound, hard to get out.  At ED no imaging done other than CXR.

## 2022-07-28 NOTE — Discharge Instructions (Addendum)
It was a pleasure caring for you today in the emergency department. ° °Please return to the emergency department for any worsening or worrisome symptoms. ° ° °

## 2022-07-28 NOTE — ED Notes (Signed)
Notified [facility] for need for transport.

## 2022-07-28 NOTE — ED Provider Notes (Signed)
[facility] EMERGENCY DEPARTMENT Provider Note   CSN: [id number] Arrival date & time: 07/27/22  1158     History  Chief Complaint  Patient presents with   Cough    STACEE EARP is a 71 y.o. female.  Patient as above with significant medical history as below, including seizures, HTN, HLD, wheelchair bound/mostly bed bound but can transfer when feeling 100%, meningioma s/p resection who presents to the ED with complaint of cough, feeling unwell  Follows with neuro Dr Leonie Man for seizures, on Keppra 500 BID and Vimpat 200 BID (recently increased dose of both 9/20)  Last seizure around 3 wks ago, has been feeling weak since then; similar to prior seizures Cough x1.5-2 weeks, not productive, no fevers or chills, no n/v She uses pure wick at baseline and has diff w/ urination, no dysuria or urgency/frequency that she can report No change with bowel habits Tolerating PO w/o diff Compliant w/ home meds, did not get her pm anti-epileptic due to being in lobby Reduced appetite last 24 hours Per spouse cannot receive MRI 2/2 metal wire in skull from meningioma, also cannot get CT 2/2 metal    Past Medical History:  Diagnosis Date   Anxiety    Arthritis    "probably" (08/25/2012)   Breast cancer of lower-outer quadrant of left female breast ([place]) 05/25/2014   Headache(784.0)    "not real frequent" (08/25/2012)   Heart disease    HTN (hypertension)    Hyperlipidemia    Meningioma ([place]) 1999   Myocardial infarction ([place]) 1987   Paresis of lower extremity ([place]) 1999   BLE/notes 08/25/2012   S/P radiation therapy 07/30/14-08/30/13   left breast cancer/50Gy   Seizures ([place])    no seizures since 2014   Spastic paraparesis    S/P meningioma resection 1999/notes 08/25/2012   Stroke ([place])    x 2, the last 2014    Past Surgical History:  Procedure Laterality Date   BRAIN MENINGIOMA EXCISION  05/1997   BREAST LUMPECTOMY WITH NEEDLE LOCALIZATION AND AXILLARY SENTINEL LYMPH  NODE BX Left 06/22/2014   Procedure: LEFT WIRE LOCALIZED [place];  Surgeon: Autumn Messing III, MD;  Location: [facility];  Service: General;  Laterality: Left;   [place]   denied intervention: "blockage wasn't bad enough"   LEFT HEART CATH AND CORONARY ANGIOGRAPHY N/A 07/08/2021   Procedure: LEFT HEART CATH AND CORONARY ANGIOGRAPHY;  Surgeon: Troy Sine, MD;  Location: [facility];  Service: Cardiovascular;  Laterality: N/A;   TIBIA IM NAIL INSERTION Right 04/26/2018   Procedure: INTRAMEDULLARY (IM) NAIL TIBIAL;  Surgeon: Altamese [place], MD;  Location: [facility];  Service: Orthopedics;  Laterality: Right;     The history is provided by the patient and the spouse. No language interpreter was used.  Cough Associated symptoms: rhinorrhea   Associated symptoms: no chest pain, no eye discharge, no fever, no headaches, no rash and no shortness of breath        Home Medications Prior to Admission medications   Medication Sig Start Date End Date Taking? Authorizing Provider  cephALEXin (KEFLEX) 500 MG capsule Take 1 capsule (500 mg total) by mouth 3 (three) times daily for 7 days. 07/28/22 08/04/22 Yes Wynona Dove A, DO  fluticasone (FLONASE) 50 MCG/ACT nasal spray Place 1 spray into both nostrils daily for 7 days. 07/28/22 08/04/22 Yes Wynona Dove A, DO  guaiFENesin (ROBITUSSIN) 100 MG/5ML liquid  Take 5 mLs by mouth every 4 (four) hours as needed for cough or to loosen phlegm. 07/28/22  Yes Wynona Dove A, DO  ASPERCREME LIDOCAINE EX Apply 1 application topically daily as needed (muscle pain).    [provider]  atorvastatin (LIPITOR) 10 MG tablet TAKE ONE TABLET BY MOUTH ONE TIME DAILY at 6pm 05/12/22   Isaac Bliss, Rayford Halsted, MD  baclofen (LIORESAL) 20 MG tablet TAKE ONE TABLET BY MOUTH FOUR TIMES DAILY 04/23/22   Isaac Bliss, Rayford Halsted, MD  calcium carbonate (OS-CAL - DOSED IN MG OF ELEMENTAL  CALCIUM) 1250 (500 Ca) MG tablet Take 1 tablet (500 mg of elemental calcium total) by mouth 2 (two) times daily with a meal. 12/29/21   Cherene Altes, MD  carboxymethylcellulose (REFRESH PLUS) 0.5 % SOLN Place 1 drop into both eyes every morning.    [provider]  clopidogrel (PLAVIX) 75 MG tablet TAKE ONE TABLET BY MOUTH ONE TIME DAILY 06/29/22   Isaac Bliss, Rayford Halsted, MD  diazepam (VALIUM) 5 MG tablet TAKE 1+1/2 TABLETS IN THE AM AND 1 TAB AT BEDTIME 07/27/22   Isaac Bliss, Rayford Halsted, MD  Lacosamide 100 MG TABS Take 2 tablets (200 mg total) by mouth 2 (two) times daily. 05/06/22   Ward Givens, NP  levETIRAcetam (KEPPRA) 500 MG tablet Take 1 tablet (500 mg total) by mouth 2 (two) times daily. 02/18/22   Garvin Fila, MD  lisinopril (ZESTRIL) 40 MG tablet TAKE ONE TABLET BY MOUTH ONE TIME DAILY 06/22/22   Isaac Bliss, Rayford Halsted, MD  liver oil-zinc oxide (DESITIN) 40 % ointment Apply 1 application topically at bedtime. Apply to buttocks    [provider]  melatonin 5 MG TABS Take 5 mg by mouth at bedtime.    [provider]  metoprolol tartrate (LOPRESSOR) 100 MG tablet TAKE ONE TABLET BY MOUTH TWICE DAILY 05/12/22   Isaac Bliss, Rayford Halsted, MD  Multiple Vitamin (MULTIVITAMIN WITH MINERALS) TABS tablet Take 1 tablet by mouth every morning.     [provider]  Oxycodone HCl 10 MG TABS Take 1 tablet (10 mg total) by mouth every 8 (eight) hours as needed. 06/01/22   Isaac Bliss, Rayford Halsted, MD  polyethylene glycol [facility] / Floria Raveling) packet Take 17 g by mouth once a week. Mix with liquid and drink    [provider]  atenolol (TENORMIN) 50 MG tablet TAKE 1 & 1/2 TABLET BY MOUTH TWICE DAILY **NEED OFFICE VISIT 02/04/21 02/05/21  Isaac Bliss, Rayford Halsted, MD      Allergies    Patient has no known allergies.    Review of Systems   Review of Systems  Constitutional:  Positive for fatigue. Negative for activity change and fever.   HENT:  Positive for rhinorrhea. Negative for facial swelling and trouble swallowing.   Eyes:  Negative for discharge and redness.  Respiratory:  Positive for cough. Negative for shortness of breath.   Cardiovascular:  Negative for chest pain and palpitations.  Gastrointestinal:  Negative for abdominal pain and nausea.  Genitourinary:  Negative for dysuria and flank pain.  Musculoskeletal:  Positive for arthralgias. Negative for back pain and gait problem.  Skin:  Negative for pallor and rash.  Neurological:  Positive for weakness. Negative for syncope and headaches.    Physical Exam Updated Vital Signs BP (!) 166/93 (BP Location: Right Arm)   Pulse (!) 110   Temp 99.9 F (37.7 C) (Oral)   Resp 18   Ht  $'5\' 2"'y$  (1.575 m)   Wt 76.2 kg   SpO2 97%   BMI 30.73 kg/m  Physical Exam Vitals and nursing note reviewed.  Constitutional:      General: She is not in acute distress.    Appearance: Normal appearance. She is obese. She is not ill-appearing, toxic-appearing or diaphoretic.  HENT:     Head: Normocephalic and atraumatic.     Right Ear: External ear normal.     Left Ear: External ear normal.     Nose: Nose normal.     Mouth/Throat:     Mouth: Mucous membranes are moist.  Eyes:     General: No scleral icterus.       Right eye: No discharge.        Left eye: No discharge.     Extraocular Movements: Extraocular movements intact.     Pupils: Pupils are equal, round, and reactive to light.  Cardiovascular:     Rate and Rhythm: Normal rate and regular rhythm.     Pulses: Normal pulses.     Heart sounds: Normal heart sounds.  Pulmonary:     Effort: Pulmonary effort is normal. No respiratory distress.     Breath sounds: Normal breath sounds.  Abdominal:     General: Abdomen is flat.     Palpations: Abdomen is soft.     Tenderness: There is no abdominal tenderness. There is no guarding or rebound.  Musculoskeletal:        General: Normal range of motion.     Cervical back:  Normal range of motion.     Right lower leg: No edema.     Left lower leg: No edema.  Skin:    General: Skin is warm and dry.     Capillary Refill: Capillary refill takes less than 2 seconds.  Neurological:     Mental Status: She is alert and oriented to person, place, and time.     GCS: GCS eye subscore is 4. GCS verbal subscore is 5. GCS motor subscore is 6.     Cranial Nerves: Cranial nerves 2-12 are intact. No facial asymmetry.     Sensory: Sensory deficit present.     Motor: Weakness present.     Coordination: Coordination is intact. Finger-Nose-Finger Test normal.     Comments: Chronic LE strength/sensation deficits b/l LE- baseline UE strength equal b/l and sensation equal b/l Unable to ambulate at baseline   Psychiatric:        Mood and Affect: Mood normal.        Behavior: Behavior normal.     ED Results / Procedures / Treatments   Labs (all labs ordered are listed, but only abnormal results are displayed) Labs Reviewed  CBC WITH DIFFERENTIAL/PLATELET - Abnormal; Notable for the following components:      Result Value   WBC 12.5 (*)    Hemoglobin 15.3 (*)    HCT 48.6 (*)    Monocytes Absolute 1.1 (*)    All other components within normal limits  COMPREHENSIVE METABOLIC PANEL - Abnormal; Notable for the following components:   Glucose, Bld 101 (*)    All other components within normal limits  URINALYSIS, ROUTINE W REFLEX MICROSCOPIC - Abnormal; Notable for the following components:   APPearance CLOUDY (*)    Hgb urine dipstick SMALL (*)    Ketones, ur 5 (*)    Leukocytes,Ua LARGE (*)    WBC, UA >50 (*)    Bacteria, UA MANY (*)    All  other components within normal limits  RESP PANEL BY RT-PCR (RSV, FLU A&B, COVID)  [id number]  URINE CULTURE  LACTIC ACID, PLASMA  LACTIC ACID, PLASMA  BRAIN NATRIURETIC PEPTIDE    EKG None  Radiology DG Chest 2 View  Result Date: 07/27/2022 CLINICAL DATA:  Right lower lobe crackles with cough. EXAM: CHEST - 2 VIEW  COMPARISON:  Radiographs 12/22/2021 and 06/02/2016. FINDINGS: Lordotic positioning. The heart size and mediastinal contours are stable with mild aortic atherosclerosis. The lungs are clear. There is no pleural effusion or pneumothorax. There are surgical clips in the left breast and axilla. No acute osseous findings are evident. IMPRESSION: No evidence of active cardiopulmonary process. Aortic atherosclerosis. Electronically Signed   By: Richardean Sale M.D.   On: 07/27/2022 14:24    Procedures Procedures    Medications Ordered in ED Medications  cephALEXin (KEFLEX) capsule 500 mg (has no administration in time range)  guaiFENesin (ROBITUSSIN) 100 MG/5ML liquid 5 mL (has no administration in time range)  levETIRAcetam (KEPPRA) tablet 500 mg (500 mg Oral Given 07/28/22 0203)  lacosamide (VIMPAT) tablet 200 mg (200 mg Oral Given 07/28/22 0202)    ED Course/ Medical Decision Making/ A&P                           Medical Decision Making Amount and/or Complexity of Data Reviewed Labs: ordered.  Risk OTC drugs. Prescription drug management.   This patient presents to the ED with chief complaint(s) of cough, malaise with pertinent past medical history of as above which further complicates the presenting complaint. The complaint involves an extensive differential diagnosis and also carries with it a high risk of complications and morbidity.    The differential diagnosis includes but not limited to viral, bacterial, pna, covid, flu, rsv, bronchitis, etc . Serious etiologies were considered.   The initial plan is to screening labs ordered in triage, cxr, will give pm anti-epileptic    Additional history obtained: Additional history obtained from spouse Records reviewed Primary Care Documents and home meds, prior neuro notes, prior labs/imaging  Independent labs interpretation:  The following labs were independently interpreted:  Ua w/ uti, send culture CMP stable CBC with WBC 12.5, not  septic  RVP neg   Independent visualization of imaging: - I independently visualized the following imaging with scope of interpretation limited to determining acute life threatening conditions related to emergency care: CXR, which revealed wnl  Cardiac monitoring was reviewed and interpreted by myself which shows NA  Treatment and Reassessment: Keflex Robitussin Given home AED;s (night dose) >> improved   Consultation: - Consulted or discussed management/test interpretation w/ external professional: na  Consideration for admission or further workup: Admission was considered   Pt here with cough x1 wk, not productive, CXR w/o PNA, not septic. No medications PTA, given anti-tussive. She has UTI, culture prior reviewed, sent culture and given 1x keflex.  The patient improved significantly and was discharged in stable condition. Detailed discussions were had with the patient regarding current findings, and need for close f/u with PCP or on call doctor. The patient has been instructed to return immediately if the symptoms worsen in any way for re-evaluation. Patient verbalized understanding and is in agreement with current care plan. All questions answered prior to discharge.    Social Determinants of health: Social History   Tobacco Use   Smoking status: Former    Packs/day: 2.00    Years: 10.00    Total pack  years: 20.00    Types: Cigarettes   Smokeless tobacco: Never   Tobacco comments:    08/25/2012 "quit smoking cigarettes in 1985 when I had my heart attack"  Vaping Use   Vaping Use: Never used  Substance Use Topics   Alcohol use: No    Alcohol/week: 7.0 standard drinks of alcohol    Types: 7 Glasses of wine per week    Comment: occasionally   Drug use: No            Final Clinical Impression(s) / ED Diagnoses Final diagnoses:  Viral URI with cough  Acute cystitis without hematuria    Rx / DC Orders ED Discharge Orders          Ordered    cephALEXin  (KEFLEX) 500 MG capsule  3 times daily        07/28/22 0304    guaiFENesin (ROBITUSSIN) 100 MG/5ML liquid  Every 4 hours PRN        07/28/22 0304    fluticasone (FLONASE) 50 MCG/ACT nasal spray  Daily        07/28/22 0304              Jeanell Sparrow, DO 07/28/22 0308

## 2022-07-29 ENCOUNTER — Encounter: Payer: Self-pay | Admitting: Internal Medicine

## 2022-07-30 ENCOUNTER — Telehealth (INDEPENDENT_AMBULATORY_CARE_PROVIDER_SITE_OTHER): Payer: PPO | Admitting: Internal Medicine

## 2022-07-30 VITALS — Wt 167.0 lb

## 2022-07-30 DIAGNOSIS — N3000 Acute cystitis without hematuria: Secondary | ICD-10-CM | POA: Diagnosis not present

## 2022-07-30 DIAGNOSIS — R410 Disorientation, unspecified: Secondary | ICD-10-CM | POA: Diagnosis not present

## 2022-07-30 DIAGNOSIS — Z9889 Other specified postprocedural states: Secondary | ICD-10-CM | POA: Diagnosis not present

## 2022-07-30 DIAGNOSIS — G459 Transient cerebral ischemic attack, unspecified: Secondary | ICD-10-CM | POA: Diagnosis not present

## 2022-07-30 DIAGNOSIS — R531 Weakness: Secondary | ICD-10-CM

## 2022-07-30 DIAGNOSIS — R059 Cough, unspecified: Secondary | ICD-10-CM

## 2022-07-30 DIAGNOSIS — G40909 Epilepsy, unspecified, not intractable, without status epilepticus: Secondary | ICD-10-CM | POA: Diagnosis not present

## 2022-07-30 DIAGNOSIS — Z8603 Personal history of neoplasm of uncertain behavior: Secondary | ICD-10-CM | POA: Diagnosis not present

## 2022-07-30 DIAGNOSIS — G8220 Paraplegia, unspecified: Secondary | ICD-10-CM | POA: Diagnosis not present

## 2022-07-30 DIAGNOSIS — I69839 Monoplegia of upper limb following other cerebrovascular disease affecting unspecified side: Secondary | ICD-10-CM

## 2022-07-30 LAB — URINE CULTURE: Culture: >=100000 — AB

## 2022-07-30 MED ORDER — CIPROFLOXACIN HCL 250 MG PO TABS: 250.0000 mg | ORAL_TABLET | Freq: Two times a day (BID) | ORAL | 0 refills | Status: AC

## 2022-07-30 NOTE — Progress Notes (Signed)
Virtual Visit via Video Note  I connected with Stacy Diaz on 07/30/22 at  4:00 PM EST by a video enabled telemedicine application and verified that I am speaking with the correct person using two identifiers.  Location patient: home Location provider: work office Persons participating in the virtual visit: patient, provider  I discussed the limitations of evaluation and management by telemedicine and the availability of in person appointments. The patient expressed understanding and agreed to proceed.   HPI: Stacy Diaz and her husband Stacy Diaz has scheduled this visit as a follow-up to her recent emergency department visit.  3 weeks prior she had a seizure at home.  Subsequent to this she became very drowsy and with a deep cough.  Because we thought she had pneumonia it was decided that she proceed to the emergency department.  After a long stay there she was found to have a normal chest x-ray, negative COVID and influenza tests, however she did end up having a UTI and was empirically prescribed cephalexin.  Husband Stacy Diaz confirms that she is improved, she is back to her baseline mental state.  Review of urine culture shows a Morganella UTI that is resistant to cephalosporins.  They are also wonder whether she would be appropriate for palliative care.  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  It is now getting harder for her husband to transport her to the hospital and to doctor appointments.  She also has a seizure disorder with very frequent breakthrough seizures and is followed by neurology.  She recently has also had a multitude of UTIs.   ROS: Constitutional: Denies fever, chills, diaphoresis, appetite change and fatigue.  HEENT: Denies photophobia, eye pain, redness, hearing loss, ear pain, congestion, sore throat, rhinorrhea, sneezing, mouth sores, trouble swallowing, neck pain, neck stiffness and tinnitus.   Respiratory: Denies  SOB, DOE, cough, chest tightness,  and wheezing.   Cardiovascular: Denies chest pain, palpitations and leg swelling.  Gastrointestinal: Denies nausea, vomiting, abdominal pain, diarrhea, constipation, blood in stool and abdominal distention.  Genitourinary: Denies dysuria, urgency, frequency, hematuria, flank pain and difficulty urinating.  Endocrine: Denies: hot or cold intolerance, sweats, changes in hair or nails, polyuria, polydipsia. Musculoskeletal: Denies myalgias, back pain, joint swelling, arthralgias and gait problem.  Skin: Denies pallor, rash and wound.  Neurological: Denies dizziness, seizures, syncope, weakness, light-headedness, numbness and headaches.  Hematological: Denies adenopathy. Easy bruising, personal or family bleeding history  Psychiatric/Behavioral: Denies suicidal ideation, mood changes, confusion, nervousness, sleep disturbance and agitation   Past Medical History:  Diagnosis Date   Anxiety    Arthritis    "probably" (08/25/2012)   Breast cancer of lower-outer quadrant of left female breast ([place]) 05/25/2014   Headache(784.0)    "not real frequent" (08/25/2012)   Heart disease    HTN (hypertension)    Hyperlipidemia    Meningioma ([place]) 1999   Myocardial infarction ([place]) 1987   Paresis of lower extremity ([place]) 1999   BLE/notes 08/25/2012   S/P radiation therapy 07/30/14-08/30/13   left breast cancer/50Gy   Seizures ([place])    no seizures since 2014   Spastic paraparesis    S/P meningioma resection 1999/notes 08/25/2012   Stroke ([place])    x 2, the last 2014    Past Surgical History:  Procedure Laterality Date   BRAIN MENINGIOMA EXCISION  05/1997   BREAST LUMPECTOMY WITH NEEDLE LOCALIZATION AND AXILLARY SENTINEL LYMPH NODE BX Left 06/22/2014  Procedure: LEFT WIRE LOCALIZED LUMPECTOPMY AND SENTINEL NODE MAPPING;  Surgeon: Autumn Messing III, MD;  Location: [facility];  Service: General;  Laterality: Left;   [place]    denied intervention: "blockage wasn't bad enough"   LEFT HEART CATH AND CORONARY ANGIOGRAPHY N/A 07/08/2021   Procedure: LEFT HEART CATH AND CORONARY ANGIOGRAPHY;  Surgeon: Troy Sine, MD;  Location: [place] [facility];  Service: Cardiovascular;  Laterality: N/A;   TIBIA IM NAIL INSERTION Right 04/26/2018   Procedure: INTRAMEDULLARY (IM) NAIL TIBIAL;  Surgeon: Altamese [place]brook, MD;  Location: [facility];  Service: Orthopedics;  Laterality: Right;    Family History  Problem Relation Age of Onset   Breast cancer Mother 33       unilateral   Heart disease Father    Breast cancer Paternal Grandmother 39       unilateral    SOCIAL HX:   reports that she has quit smoking. Her smoking use included cigarettes. She has a 20.00 pack-year smoking history. She has never used smokeless tobacco. She reports that she does not drink alcohol and does not use drugs.   Current Outpatient Medications:    ASPERCREME LIDOCAINE EX, Apply 1 application topically daily as needed (muscle pain)., Disp: , Rfl:    atorvastatin (LIPITOR) 10 MG tablet, TAKE ONE TABLET BY MOUTH ONE TIME DAILY at 6pm, Disp: 90 tablet, Rfl: 0   baclofen (LIORESAL) 20 MG tablet, TAKE ONE TABLET BY MOUTH FOUR TIMES DAILY, Disp: 360 tablet, Rfl: 0   calcium carbonate (OS-CAL - DOSED IN MG OF ELEMENTAL CALCIUM) 1250 (500 Ca) MG tablet, Take 1 tablet (500 mg of elemental calcium total) by mouth 2 (two) times daily with a meal., Disp: , Rfl:    carboxymethylcellulose (REFRESH PLUS) 0.5 % SOLN, Place 1 drop into both eyes every morning., Disp: , Rfl:    cephALEXin (KEFLEX) 500 MG capsule, Take 1 capsule (500 mg total) by mouth 3 (three) times daily for 7 days., Disp: 21 capsule, Rfl: 0   ciprofloxacin (CIPRO) 250 MG tablet, Take 1 tablet (250 mg total) by mouth 2 (two) times daily for 7 days., Disp: 14 tablet, Rfl: 0   clopidogrel (PLAVIX) 75 MG tablet, TAKE ONE TABLET BY MOUTH ONE TIME DAILY, Disp: 90 tablet, Rfl: 0   diazepam (VALIUM) 5 MG  tablet, TAKE 1+1/2 TABLETS IN THE AM AND 1 TAB AT BEDTIME, Disp: 75 tablet, Rfl: 0   fluticasone (FLONASE) 50 MCG/ACT nasal spray, Place 1 spray into both nostrils daily for 7 days., Disp: 15.8 mL, Rfl: 0   guaiFENesin (ROBITUSSIN) 100 MG/5ML liquid, Take 5 mLs by mouth every 4 (four) hours as needed for cough or to loosen phlegm., Disp: 120 mL, Rfl: 0   Lacosamide 100 MG TABS, Take 2 tablets (200 mg total) by mouth 2 (two) times daily., Disp: 360 tablet, Rfl: 1   levETIRAcetam (KEPPRA) 500 MG tablet, Take 1 tablet (500 mg total) by mouth 2 (two) times daily., Disp: 180 tablet, Rfl: 3   lisinopril (ZESTRIL) 40 MG tablet, TAKE ONE TABLET BY MOUTH ONE TIME DAILY, Disp: 90 tablet, Rfl: 1   liver oil-zinc oxide (DESITIN) 40 % ointment, Apply 1 application topically at bedtime. Apply to buttocks, Disp: , Rfl:    melatonin 5 MG TABS, Take 5 mg by mouth at bedtime., Disp: , Rfl:    metoprolol tartrate (LOPRESSOR) 100 MG tablet, TAKE ONE TABLET BY MOUTH TWICE DAILY, Disp: 180 tablet,  Rfl: 0   Multiple Vitamin (MULTIVITAMIN WITH MINERALS) TABS tablet, Take 1 tablet by mouth every morning. , Disp: , Rfl:    Oxycodone HCl 10 MG TABS, Take 1 tablet (10 mg total) by mouth every 8 (eight) hours as needed., Disp: 270 tablet, Rfl: 0   polyethylene glycol (MIRALAX / GLYCOLAX) packet, Take 17 g by mouth once a week. Mix with liquid and drink, Disp: , Rfl:   EXAM:   VITALS per patient if applicable: None reported  GENERAL: alert, oriented, appears well and in no acute distress, lying in hospital bed  HEENT: atraumatic, conjunttiva clear, no obvious abnormalities on inspection of external nose and ears  NECK: normal movements of the head and neck  LUNGS: on inspection no signs of respiratory distress, breathing rate appears normal, no obvious gross increased work of breathing, gasping or wheezing  CV: no obvious cyanosis  MS: moves all visible extremities without noticeable abnormality  PSYCH/NEURO:  pleasant and cooperative, no obvious depression or anxiety, speech and thought processing grossly intact  ASSESSMENT AND PLAN:   Seizure disorder (HCC)  Cough, unspecified type  Acute cystitis without hematuria - Plan: ciprofloxacin (CIPRO) 250 MG tablet  -Recent UTI was not sensitive to cephalexin prescribed, will switch antibiotics to Cipro 250 mg twice daily for 7 days. -I agree that palliative care is a good choice for her given her limited mobility, and chronic comorbid conditions.   I discussed the assessment and treatment plan with the patient. The patient was provided an opportunity to ask questions and all were answered. The patient agreed with the plan and demonstrated an understanding of the instructions.   The patient was advised to call back or seek an in-person evaluation if the symptoms worsen or if the condition fails to improve as anticipated.    Lelon Frohlich, MD  [facility]

## 2022-07-31 ENCOUNTER — Telehealth (HOSPITAL_BASED_OUTPATIENT_CLINIC_OR_DEPARTMENT_OTHER): Payer: Self-pay | Admitting: *Deleted

## 2022-07-31 NOTE — Telephone Encounter (Signed)
Post ED Visit - Positive Culture Follow-up  Culture report reviewed by antimicrobial stewardship pharmacist: [place] Team '[]'$  Elenor Quinones, Pharm.D. '[]'$  Heide Guile, Pharm.D., BCPS AQ-ID '[]'$  Parks Neptune, Pharm.D., BCPS '[]'$  Alycia Rossetti, Pharm.D., BCPS '[]'$  [place], Pharm.D., BCPS, AAHIVP '[]'$  Legrand Como, Pharm.D., BCPS, AAHIVP '[]'$  Salome Arnt, PharmD, BCPS '[]'$  Johnnette Gourd, PharmD, BCPS '[]'$  Hughes Better, PharmD, BCPS '[]'$  Leeroy Cha, PharmD '[]'$  Laqueta Linden, PharmD, BCPS '[]'$  Albertina Parr, PharmD  [place] Team '[]'$  Leodis Sias, PharmD '[]'$  Lindell Spar, PharmD '[]'$  Royetta Asal, PharmD '[]'$  Graylin Shiver, Rph '[]'$  Rema Fendt) Glennon Mac, PharmD '[]'$  Arlyn Dunning, PharmD '[]'$  Netta Cedars, PharmD '[]'$  Dia Sitter, PharmD '[]'$  Leone Haven, PharmD '[]'$  Gretta Arab, PharmD '[]'$  Theodis Shove, PharmD '[]'$  Peggyann Juba, PharmD '[]'$  Reuel Boom, PharmD   Positive urine culture Treated with Cephalexin, organism sensitive to the same and no further patient follow-up is required at this time.  Bertis Ruddy, Pharm D  Harlon Flor Talley 07/31/2022, 2:15 PM

## 2022-08-03 ENCOUNTER — Telehealth: Payer: Self-pay | Admitting: Internal Medicine

## 2022-08-03 DIAGNOSIS — R059 Cough, unspecified: Secondary | ICD-10-CM | POA: Diagnosis not present

## 2022-08-03 NOTE — Telephone Encounter (Signed)
Saw patient today, vitals taken, patient doing better, cough and tiredness is better. Tesalon given, told to f/u with PCP

## 2022-08-13 ENCOUNTER — Encounter: Payer: Self-pay | Admitting: Internal Medicine

## 2022-08-13 DIAGNOSIS — I10 Essential (primary) hypertension: Secondary | ICD-10-CM

## 2022-08-13 DIAGNOSIS — R059 Cough, unspecified: Secondary | ICD-10-CM | POA: Diagnosis not present

## 2022-08-13 DIAGNOSIS — Z87440 Personal history of urinary (tract) infections: Secondary | ICD-10-CM | POA: Diagnosis not present

## 2022-08-13 MED ORDER — METOPROLOL TARTRATE 100 MG PO TABS: 100.0000 mg | ORAL_TABLET | Freq: Two times a day (BID) | ORAL | 1 refills | Status: AC

## 2022-08-16 ENCOUNTER — Other Ambulatory Visit: Payer: Self-pay | Admitting: Internal Medicine

## 2022-08-19 ENCOUNTER — Encounter: Payer: Self-pay | Admitting: Internal Medicine

## 2022-08-19 DIAGNOSIS — Z9889 Other specified postprocedural states: Secondary | ICD-10-CM

## 2022-08-19 DIAGNOSIS — Z170 Estrogen receptor positive status [ER+]: Secondary | ICD-10-CM

## 2022-08-19 DIAGNOSIS — G40909 Epilepsy, unspecified, not intractable, without status epilepticus: Secondary | ICD-10-CM

## 2022-08-19 DIAGNOSIS — I69839 Monoplegia of upper limb following other cerebrovascular disease affecting unspecified side: Secondary | ICD-10-CM

## 2022-08-19 DIAGNOSIS — G8220 Paraplegia, unspecified: Secondary | ICD-10-CM

## 2022-08-19 DIAGNOSIS — Z8673 Personal history of transient ischemic attack (TIA), and cerebral infarction without residual deficits: Secondary | ICD-10-CM

## 2022-08-20 ENCOUNTER — Telehealth: Payer: Self-pay | Admitting: Internal Medicine

## 2022-08-20 NOTE — Telephone Encounter (Signed)
Referral placed and responded via MyChart message.

## 2022-08-20 NOTE — Telephone Encounter (Signed)
Family requesting palliative care consult

## 2022-08-31 ENCOUNTER — Encounter: Payer: Self-pay | Admitting: Internal Medicine

## 2022-08-31 DIAGNOSIS — R531 Weakness: Secondary | ICD-10-CM

## 2022-08-31 DIAGNOSIS — Z8673 Personal history of transient ischemic attack (TIA), and cerebral infarction without residual deficits: Secondary | ICD-10-CM

## 2022-08-31 DIAGNOSIS — I69839 Monoplegia of upper limb following other cerebrovascular disease affecting unspecified side: Secondary | ICD-10-CM

## 2022-09-02 ENCOUNTER — Other Ambulatory Visit: Payer: Self-pay | Admitting: Internal Medicine

## 2022-09-02 ENCOUNTER — Telehealth: Payer: Self-pay | Admitting: Internal Medicine

## 2022-09-02 DIAGNOSIS — E785 Hyperlipidemia, unspecified: Secondary | ICD-10-CM | POA: Diagnosis not present

## 2022-09-02 DIAGNOSIS — Z87440 Personal history of urinary (tract) infections: Secondary | ICD-10-CM | POA: Diagnosis not present

## 2022-09-02 DIAGNOSIS — G8220 Paraplegia, unspecified: Secondary | ICD-10-CM

## 2022-09-02 DIAGNOSIS — F419 Anxiety disorder, unspecified: Secondary | ICD-10-CM | POA: Diagnosis not present

## 2022-09-02 DIAGNOSIS — M1990 Unspecified osteoarthritis, unspecified site: Secondary | ICD-10-CM | POA: Diagnosis not present

## 2022-09-02 DIAGNOSIS — I252 Old myocardial infarction: Secondary | ICD-10-CM | POA: Diagnosis not present

## 2022-09-02 DIAGNOSIS — I119 Hypertensive heart disease without heart failure: Secondary | ICD-10-CM | POA: Diagnosis not present

## 2022-09-02 DIAGNOSIS — I69339 Monoplegia of upper limb following cerebral infarction affecting unspecified side: Secondary | ICD-10-CM | POA: Diagnosis not present

## 2022-09-02 DIAGNOSIS — I2510 Atherosclerotic heart disease of native coronary artery without angina pectoris: Secondary | ICD-10-CM | POA: Diagnosis not present

## 2022-09-02 DIAGNOSIS — G8191 Hemiplegia, unspecified affecting right dominant side: Secondary | ICD-10-CM | POA: Diagnosis not present

## 2022-09-02 DIAGNOSIS — G40509 Epileptic seizures related to external causes, not intractable, without status epilepticus: Secondary | ICD-10-CM | POA: Diagnosis not present

## 2022-09-02 NOTE — Telephone Encounter (Signed)
Verbal orders given to Pricilla Handler

## 2022-09-02 NOTE — Telephone Encounter (Signed)
[place] [facility] [place] [phone] [facility] to leave a detailed message on this line  PT Verbal Orders:  2w3 1w1  Also requesting Verbal Order to:  Add OT and Speech  Please advise.

## 2022-09-04 ENCOUNTER — Telehealth: Payer: Self-pay | Admitting: *Deleted

## 2022-09-04 ENCOUNTER — Encounter: Payer: Self-pay | Admitting: *Deleted

## 2022-09-04 NOTE — Patient Outreach (Signed)
  Care Coordination   Initial Visit Note   09/04/2022 Name: Stacy Diaz MRN: [id number] DOB: 09/09/1950  Stacy Diaz is a 72 y.o. year old female who sees Isaac Bliss, Rayford Halsted, MD for primary care. I spoke with  Stacy Diaz by phone today.  What matters to the patients health and wellness today?  No needs    Goals Addressed             This Visit's Progress    COMPLETED: Care coordination activity       Care Coordination Interventions: Reviewed medications with patient and discussed adherence with no needed refills Reviewed scheduled/upcoming provider appointments including sufficient transportation source Assessed social determinant of health barriers Educated on care management services with no needs presented today as spouse has indicated pt involved with [facility] and [facility] for Palliative services. Caregiver aware on how to reach out to care management office for any future needs.         SDOH assessments and interventions completed:  Yes  SDOH Interventions Today    Flowsheet Row Most Recent Value  SDOH Interventions   Food Insecurity Interventions Intervention Not Indicated  Housing Interventions Intervention Not Indicated  Transportation Interventions Intervention Not Indicated  Utilities Interventions Intervention Not Indicated        Care Coordination Interventions:  Yes, provided   Follow up plan: No further intervention required.   Encounter Outcome:  Pt. Visit Completed   Raina Mina, RN Care Management Coordinator [place] Office [phone]

## 2022-09-04 NOTE — Patient Instructions (Signed)
Visit Information  Thank you for taking time to visit with me today. Please don't hesitate to contact me if I can be of assistance to you.   Following are the goals we discussed today:   Goals Addressed             This Visit's Progress    COMPLETED: Care coordination activity       Care Coordination Interventions: Reviewed medications with patient and discussed adherence with no needed refills Reviewed scheduled/upcoming provider appointments including sufficient transportation source Assessed social determinant of health barriers Educated on care management services with no needs presented today as spouse has indicated pt involved with [facility] and [facility] for Palliative services. Caregiver aware on how to reach out to care management office for any future needs.         Please call the care guide team at [phone] if you need to cancel or reschedule your appointment.   If you are experiencing a Mental Health or [place] or need someone to talk to, please call the Suicide and Crisis Lifeline: 988  Patient verbalizes understanding of instructions and care plan provided today and agrees to view in [place]. Active MyChart status and patient understanding of how to access instructions and care plan via MyChart confirmed with patient.     No further follow up required: No needs  Raina Mina, RN Care Management Coordinator [place] Office [phone]

## 2022-09-08 ENCOUNTER — Telehealth: Payer: Self-pay | Admitting: Internal Medicine

## 2022-09-08 NOTE — Telephone Encounter (Signed)
Move home health speech eval from week of 01/22 to week of 01/29 per patient request

## 2022-09-08 NOTE — Telephone Encounter (Signed)
Patient dropped off document to be filled out by provider Handicap Placard. Patient requested to send it via Call Patient to pick up (husband will come by) within 5-days. Document is located in providers tray at front office.   Please advise

## 2022-09-09 NOTE — Telephone Encounter (Signed)
Placed in Dr Hernandez's folder 

## 2022-09-11 NOTE — Telephone Encounter (Signed)
Barnett Applebaum is calling checking on this VO

## 2022-09-14 NOTE — Telephone Encounter (Signed)
Form is ready to be picked up, and the husband is aware

## 2022-09-14 NOTE — Telephone Encounter (Signed)
Left message on machine for Stacy Diaz with verbal orders.

## 2022-09-17 ENCOUNTER — Other Ambulatory Visit: Payer: Self-pay | Admitting: Internal Medicine

## 2022-09-17 ENCOUNTER — Encounter: Payer: Self-pay | Admitting: Internal Medicine

## 2022-09-17 DIAGNOSIS — I10 Essential (primary) hypertension: Secondary | ICD-10-CM

## 2022-09-17 DIAGNOSIS — G8220 Paraplegia, unspecified: Secondary | ICD-10-CM

## 2022-09-17 DIAGNOSIS — Z8673 Personal history of transient ischemic attack (TIA), and cerebral infarction without residual deficits: Secondary | ICD-10-CM

## 2022-09-18 NOTE — Telephone Encounter (Signed)
Pt calling to check progress of this request

## 2022-09-21 ENCOUNTER — Telehealth: Payer: Self-pay | Admitting: Internal Medicine

## 2022-09-21 DIAGNOSIS — I69339 Monoplegia of upper limb following cerebral infarction affecting unspecified side: Secondary | ICD-10-CM | POA: Diagnosis not present

## 2022-09-21 DIAGNOSIS — G40509 Epileptic seizures related to external causes, not intractable, without status epilepticus: Secondary | ICD-10-CM | POA: Diagnosis not present

## 2022-09-21 DIAGNOSIS — I252 Old myocardial infarction: Secondary | ICD-10-CM | POA: Diagnosis not present

## 2022-09-21 DIAGNOSIS — G8191 Hemiplegia, unspecified affecting right dominant side: Secondary | ICD-10-CM | POA: Diagnosis not present

## 2022-09-21 DIAGNOSIS — Z87440 Personal history of urinary (tract) infections: Secondary | ICD-10-CM | POA: Diagnosis not present

## 2022-09-21 DIAGNOSIS — F419 Anxiety disorder, unspecified: Secondary | ICD-10-CM | POA: Diagnosis not present

## 2022-09-21 DIAGNOSIS — I2510 Atherosclerotic heart disease of native coronary artery without angina pectoris: Secondary | ICD-10-CM | POA: Diagnosis not present

## 2022-09-21 DIAGNOSIS — I119 Hypertensive heart disease without heart failure: Secondary | ICD-10-CM | POA: Diagnosis not present

## 2022-09-21 DIAGNOSIS — E785 Hyperlipidemia, unspecified: Secondary | ICD-10-CM | POA: Diagnosis not present

## 2022-09-21 DIAGNOSIS — M1990 Unspecified osteoarthritis, unspecified site: Secondary | ICD-10-CM | POA: Diagnosis not present

## 2022-09-21 MED ORDER — OXYCODONE HCL 10 MG PO TABS: 10.0000 mg | ORAL_TABLET | Freq: Three times a day (TID) | ORAL | 0 refills | Status: AC | PRN

## 2022-09-21 NOTE — Telephone Encounter (Signed)
Will OT enhabit hh VO 2x4

## 2022-09-22 ENCOUNTER — Encounter: Payer: Self-pay | Admitting: Internal Medicine

## 2022-09-22 NOTE — Telephone Encounter (Signed)
Spoke with husband and yesterday the patient's blood pressure after PT/OT was 150/70. Patient takes Lisinopril 40 mg 1 tablet in the morning, and Metoprolol 100 mg 1 tablet twice a day.

## 2022-09-22 NOTE — Telephone Encounter (Signed)
Verbal orders given to Will OT enhabit hh VO

## 2022-09-23 NOTE — Telephone Encounter (Signed)
Husband is aware, per Dr Jerilee Hoh, to document patient's BP and send [place] a report via [place].

## 2022-10-01 ENCOUNTER — Telehealth: Payer: Self-pay | Admitting: Internal Medicine

## 2022-10-01 NOTE — Telephone Encounter (Signed)
Reporting blood pressure out of parameter 160/110

## 2022-10-02 ENCOUNTER — Encounter: Payer: Self-pay | Admitting: Internal Medicine

## 2022-10-06 DIAGNOSIS — I110 Hypertensive heart disease with heart failure: Secondary | ICD-10-CM | POA: Diagnosis not present

## 2022-10-06 DIAGNOSIS — I493 Ventricular premature depolarization: Secondary | ICD-10-CM | POA: Diagnosis not present

## 2022-10-06 DIAGNOSIS — I69351 Hemiplegia and hemiparesis following cerebral infarction affecting right dominant side: Secondary | ICD-10-CM | POA: Diagnosis not present

## 2022-10-06 DIAGNOSIS — I5022 Chronic systolic (congestive) heart failure: Secondary | ICD-10-CM | POA: Diagnosis not present

## 2022-10-08 ENCOUNTER — Encounter: Payer: Self-pay | Admitting: Adult Health

## 2022-10-12 NOTE — Telephone Encounter (Signed)
That's fine

## 2022-10-15 DIAGNOSIS — I5022 Chronic systolic (congestive) heart failure: Secondary | ICD-10-CM | POA: Diagnosis not present

## 2022-10-15 DIAGNOSIS — I110 Hypertensive heart disease with heart failure: Secondary | ICD-10-CM | POA: Diagnosis not present

## 2022-10-16 DIAGNOSIS — G40509 Epileptic seizures related to external causes, not intractable, without status epilepticus: Secondary | ICD-10-CM | POA: Diagnosis not present

## 2022-10-16 DIAGNOSIS — F419 Anxiety disorder, unspecified: Secondary | ICD-10-CM | POA: Diagnosis not present

## 2022-10-16 DIAGNOSIS — G8191 Hemiplegia, unspecified affecting right dominant side: Secondary | ICD-10-CM | POA: Diagnosis not present

## 2022-10-16 DIAGNOSIS — I119 Hypertensive heart disease without heart failure: Secondary | ICD-10-CM | POA: Diagnosis not present

## 2022-10-16 DIAGNOSIS — I252 Old myocardial infarction: Secondary | ICD-10-CM | POA: Diagnosis not present

## 2022-10-16 DIAGNOSIS — I2510 Atherosclerotic heart disease of native coronary artery without angina pectoris: Secondary | ICD-10-CM | POA: Diagnosis not present

## 2022-10-16 DIAGNOSIS — E785 Hyperlipidemia, unspecified: Secondary | ICD-10-CM | POA: Diagnosis not present

## 2022-10-16 DIAGNOSIS — M1990 Unspecified osteoarthritis, unspecified site: Secondary | ICD-10-CM | POA: Diagnosis not present

## 2022-10-16 DIAGNOSIS — I69339 Monoplegia of upper limb following cerebral infarction affecting unspecified side: Secondary | ICD-10-CM | POA: Diagnosis not present

## 2022-10-16 DIAGNOSIS — R41841 Cognitive communication deficit: Secondary | ICD-10-CM | POA: Diagnosis not present

## 2022-10-29 DIAGNOSIS — E785 Hyperlipidemia, unspecified: Secondary | ICD-10-CM | POA: Diagnosis not present

## 2022-10-29 DIAGNOSIS — I110 Hypertensive heart disease with heart failure: Secondary | ICD-10-CM | POA: Diagnosis not present

## 2022-10-29 DIAGNOSIS — I519 Heart disease, unspecified: Secondary | ICD-10-CM | POA: Diagnosis not present

## 2022-10-29 DIAGNOSIS — M1990 Unspecified osteoarthritis, unspecified site: Secondary | ICD-10-CM | POA: Diagnosis not present

## 2022-11-02 DIAGNOSIS — E785 Hyperlipidemia, unspecified: Secondary | ICD-10-CM | POA: Diagnosis not present

## 2022-11-02 DIAGNOSIS — R54 Age-related physical debility: Secondary | ICD-10-CM | POA: Diagnosis not present

## 2022-11-02 DIAGNOSIS — M810 Age-related osteoporosis without current pathological fracture: Secondary | ICD-10-CM | POA: Diagnosis not present

## 2022-11-02 DIAGNOSIS — I110 Hypertensive heart disease with heart failure: Secondary | ICD-10-CM | POA: Diagnosis not present

## 2022-11-02 DIAGNOSIS — Z853 Personal history of malignant neoplasm of breast: Secondary | ICD-10-CM | POA: Diagnosis not present

## 2022-11-03 ENCOUNTER — Encounter: Payer: Self-pay | Admitting: Internal Medicine

## 2022-11-06 DIAGNOSIS — I5022 Chronic systolic (congestive) heart failure: Secondary | ICD-10-CM | POA: Diagnosis not present

## 2022-11-06 DIAGNOSIS — I110 Hypertensive heart disease with heart failure: Secondary | ICD-10-CM | POA: Diagnosis not present

## 2022-11-09 ENCOUNTER — Other Ambulatory Visit: Payer: Self-pay | Admitting: Internal Medicine

## 2022-11-09 DIAGNOSIS — E785 Hyperlipidemia, unspecified: Secondary | ICD-10-CM

## 2022-11-09 DIAGNOSIS — M8000XA Age-related osteoporosis with current pathological fracture, unspecified site, initial encounter for fracture: Secondary | ICD-10-CM

## 2022-11-09 DIAGNOSIS — C50512 Malignant neoplasm of lower-outer quadrant of left female breast: Secondary | ICD-10-CM

## 2022-11-09 DIAGNOSIS — I5022 Chronic systolic (congestive) heart failure: Secondary | ICD-10-CM

## 2022-11-09 DIAGNOSIS — Z9889 Other specified postprocedural states: Secondary | ICD-10-CM

## 2022-11-09 DIAGNOSIS — G40909 Epilepsy, unspecified, not intractable, without status epilepticus: Secondary | ICD-10-CM

## 2022-11-09 DIAGNOSIS — Z8603 Personal history of neoplasm of uncertain behavior: Secondary | ICD-10-CM

## 2022-11-09 DIAGNOSIS — G8220 Paraplegia, unspecified: Secondary | ICD-10-CM

## 2022-11-09 DIAGNOSIS — I10 Essential (primary) hypertension: Secondary | ICD-10-CM

## 2022-11-09 DIAGNOSIS — Z8673 Personal history of transient ischemic attack (TIA), and cerebral infarction without residual deficits: Secondary | ICD-10-CM

## 2022-11-09 DIAGNOSIS — G40209 Localization-related (focal) (partial) symptomatic epilepsy and epileptic syndromes with complex partial seizures, not intractable, without status epilepticus: Secondary | ICD-10-CM

## 2022-11-09 DIAGNOSIS — Z170 Estrogen receptor positive status [ER+]: Secondary | ICD-10-CM

## 2022-11-11 DIAGNOSIS — M1990 Unspecified osteoarthritis, unspecified site: Secondary | ICD-10-CM | POA: Diagnosis not present

## 2022-11-11 DIAGNOSIS — R54 Age-related physical debility: Secondary | ICD-10-CM | POA: Diagnosis not present

## 2022-11-11 DIAGNOSIS — E785 Hyperlipidemia, unspecified: Secondary | ICD-10-CM | POA: Diagnosis not present

## 2022-11-11 DIAGNOSIS — I110 Hypertensive heart disease with heart failure: Secondary | ICD-10-CM | POA: Diagnosis not present

## 2022-11-13 DIAGNOSIS — Z993 Dependence on wheelchair: Secondary | ICD-10-CM | POA: Diagnosis not present

## 2022-11-13 DIAGNOSIS — G40909 Epilepsy, unspecified, not intractable, without status epilepticus: Secondary | ICD-10-CM | POA: Diagnosis not present

## 2022-11-13 DIAGNOSIS — G8220 Paraplegia, unspecified: Secondary | ICD-10-CM | POA: Diagnosis not present

## 2022-11-13 DIAGNOSIS — Z515 Encounter for palliative care: Secondary | ICD-10-CM | POA: Diagnosis not present

## 2022-11-13 DIAGNOSIS — I10 Essential (primary) hypertension: Secondary | ICD-10-CM | POA: Diagnosis not present

## 2022-11-16 DIAGNOSIS — I252 Old myocardial infarction: Secondary | ICD-10-CM | POA: Diagnosis not present

## 2022-11-16 DIAGNOSIS — G8191 Hemiplegia, unspecified affecting right dominant side: Secondary | ICD-10-CM | POA: Diagnosis not present

## 2022-11-16 DIAGNOSIS — G40509 Epileptic seizures related to external causes, not intractable, without status epilepticus: Secondary | ICD-10-CM | POA: Diagnosis not present

## 2022-11-16 DIAGNOSIS — I119 Hypertensive heart disease without heart failure: Secondary | ICD-10-CM | POA: Diagnosis not present

## 2022-11-16 DIAGNOSIS — M1990 Unspecified osteoarthritis, unspecified site: Secondary | ICD-10-CM | POA: Diagnosis not present

## 2022-11-16 DIAGNOSIS — I2510 Atherosclerotic heart disease of native coronary artery without angina pectoris: Secondary | ICD-10-CM | POA: Diagnosis not present

## 2022-11-16 DIAGNOSIS — E785 Hyperlipidemia, unspecified: Secondary | ICD-10-CM | POA: Diagnosis not present

## 2022-11-16 DIAGNOSIS — F419 Anxiety disorder, unspecified: Secondary | ICD-10-CM | POA: Diagnosis not present

## 2022-11-16 DIAGNOSIS — R41841 Cognitive communication deficit: Secondary | ICD-10-CM | POA: Diagnosis not present

## 2022-11-16 DIAGNOSIS — I69339 Monoplegia of upper limb following cerebral infarction affecting unspecified side: Secondary | ICD-10-CM | POA: Diagnosis not present

## 2022-11-19 ENCOUNTER — Encounter: Payer: Self-pay | Admitting: Hematology and Oncology

## 2022-11-24 ENCOUNTER — Other Ambulatory Visit: Payer: Self-pay | Admitting: Adult Health

## 2022-11-24 DIAGNOSIS — R569 Unspecified convulsions: Secondary | ICD-10-CM

## 2022-11-26 ENCOUNTER — Telehealth (INDEPENDENT_AMBULATORY_CARE_PROVIDER_SITE_OTHER): Payer: HMO | Admitting: Adult Health

## 2022-11-26 DIAGNOSIS — R569 Unspecified convulsions: Secondary | ICD-10-CM

## 2022-11-26 DIAGNOSIS — M810 Age-related osteoporosis without current pathological fracture: Secondary | ICD-10-CM | POA: Diagnosis not present

## 2022-11-26 DIAGNOSIS — Z87440 Personal history of urinary (tract) infections: Secondary | ICD-10-CM | POA: Diagnosis not present

## 2022-11-26 DIAGNOSIS — G9340 Encephalopathy, unspecified: Secondary | ICD-10-CM | POA: Diagnosis not present

## 2022-11-26 DIAGNOSIS — Z853 Personal history of malignant neoplasm of breast: Secondary | ICD-10-CM | POA: Diagnosis not present

## 2022-11-26 DIAGNOSIS — I110 Hypertensive heart disease with heart failure: Secondary | ICD-10-CM | POA: Diagnosis not present

## 2022-11-26 DIAGNOSIS — Z7401 Bed confinement status: Secondary | ICD-10-CM | POA: Diagnosis not present

## 2022-11-26 DIAGNOSIS — G459 Transient cerebral ischemic attack, unspecified: Secondary | ICD-10-CM

## 2022-11-26 DIAGNOSIS — R54 Age-related physical debility: Secondary | ICD-10-CM | POA: Diagnosis not present

## 2022-11-26 DIAGNOSIS — E785 Hyperlipidemia, unspecified: Secondary | ICD-10-CM | POA: Diagnosis not present

## 2022-11-26 NOTE — Progress Notes (Signed)
PATIENT: Stacy Diaz DOB: [birth year]  REASON FOR VISIT: follow up HISTORY FROM: patient  Virtual Visit via Video Note  I connected with Stacy Diaz on 11/26/22 at  2:00 PM EDT by a video enabled telemedicine application located remotely at [facility] and verified that I am speaking with the correct person using two identifiers who was located at their own home.   I discussed the limitations of evaluation and management by telemedicine and the availability of in person appointments. The patient expressed understanding and agreed to proceed.   PATIENT: Stacy Diaz DOB: September 30, 1950  REASON FOR VISIT: follow up HISTORY FROM: patient  HISTORY OF PRESENT ILLNESS: Today 11/26/22:  Stacy Diaz is a 72 y.o. female with a history of Seizure events. Returns today for follow-up.  She remains on Vimpat 200 mg twice a day and Keppra 500 mg twice a day.  She had an episode in December.  Reports that her right arm went numb.  She states that this lasted about 10 hours before it resolved.  She did not go to the emergency room initially.  She states 2 to 3 days later she had a bad cough and was worried about pneumonia so she went to the emergency room.  She was diagnosed with a UTI.  Her husband states that since the initial event of right arm numbness she has not returned to her baseline.  He reports generalized weakness.  She is currently working with physical therapy.  She can transfer to a wheelchair but does not ambulate.  He states that each time she has an event she does not return to her original baseline.  Patient is bedbound therefore she cannot come to an appointment or do imaging per patient  HISTORY  05/06/22: [name] is a 72 year old female with a history of seizures.  She returns today for follow-up.  She is here today with her husband.  She reports that two weeks ago was complaining that left side of face was numb. Did not travel to arm like  before. Lasted a few hours per husband.  She is currently taking Vimpat 100 mg in the morning and 200 mg in the evening and Keppra 500 mg twice a day.  She is in a motorized wheelchair.  She returns today for an evaluation.   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  Code stroke was initiated upon ER arrival but upon evaluation neurology felt her symptoms most likely represented acute metabolic encephalopathy instead of stroke.  CT head showed old encephalomalacia and cerebral brain and chronic white matter changes and old craniectomy changes.  EEG showed cortical dysfunction in the left central parietal region and moderate diffuse encephalopathy.  No definite seizure activity35.  Neurology felt she had generalized encephalopathy from infection in setting of poor cognitive reserve with worsening of old symptoms but subsequently was noted to have focal seizures and started on Keppra initially and subsequently Vimpat was added.  She was found to have severe sepsis due to E. coli and Proteus urinary tract infection.  She was treated initially with Rocephin which was sensitive  to both organisms subsequently switched to Keflex.  She was transferred to rehabilitation at [facility] which made gradual improvement and she is recently come home.  She states she had 1 more breakthrough seizure last Tuesday which was brief and did not last long.  She is tolerating Keppra finder milligram twice daily as well as Vimpat 100 mg twice daily without any significant side effects.  She feels she has not returned back to her  baseline and she cannot multitask and her cognition is not as sharp as it used to be and she has some trouble finding words and has to think about the words.  She is scheduled to start home physical and Occupational Therapy soon.   REVIEW OF SYSTEMS: Out of a complete 14 system review of symptoms, the patient complains only of the following symptoms, and all other reviewed systems are negative.  ALLERGIES: No Known Allergies  HOME MEDICATIONS: Outpatient Medications Prior to Visit  Medication Sig Dispense Refill   ASPERCREME LIDOCAINE EX Apply 1 application topically daily as needed (muscle pain).     atorvastatin (LIPITOR) 10 MG tablet TAKE 1 TABLET (10 MG TOTAL) BY MOUTH DAILY AT 6 PM. 90 tablet 1   baclofen (LIORESAL) 20 MG tablet TAKE 1 TABLET BY MOUTH FOUR TIMES A DAY 360 tablet 1   calcium carbonate (OS-CAL - DOSED IN MG OF ELEMENTAL CALCIUM) 1250 (500 Ca) MG tablet Take 1 tablet (500 mg of elemental calcium total) by mouth 2 (two) times daily with a meal.     carboxymethylcellulose (REFRESH PLUS) 0.5 % SOLN Place 1 drop into both eyes every morning.     clopidogrel (PLAVIX) 75 MG tablet TAKE 1 TABLET BY MOUTH EVERY DAY 90 tablet 1   diazepam (VALIUM) 5 MG tablet TAKE 1+1/2 TABLETS IN THE AM AND 1 TAB AT BEDTIME 75 tablet 0   fluticasone (FLONASE) 50 MCG/ACT nasal spray Place 1 spray into both nostrils daily for 7 days. 15.8 mL 0   guaiFENesin (ROBITUSSIN) 100 MG/5ML liquid Take 5 mLs by mouth every 4 (four) hours as needed for cough or to loosen phlegm. 120 mL 0   Lacosamide 100 MG TABS TAKE TWO TABLETS BY MOUTH TWICE DAILY 360 tablet 1   levETIRAcetam (KEPPRA) 500 MG tablet Take 1 tablet (500 mg total) by mouth 2 (two) times daily. 180 tablet 3   lisinopril (ZESTRIL) 40 MG tablet TAKE 1 TABLET BY MOUTH EVERY DAY 90 tablet 1   liver oil-zinc oxide (DESITIN) 40 % ointment Apply 1 application topically at bedtime. Apply to buttocks     melatonin 5 MG TABS Take 5 mg by mouth at bedtime.      metoprolol tartrate (LOPRESSOR) 100 MG tablet Take 1 tablet (100 mg total) by mouth 2 (two) times daily. 180 tablet 1   Multiple Vitamin (MULTIVITAMIN WITH MINERALS) TABS tablet Take 1 tablet by mouth every morning.      Oxycodone HCl 10 MG TABS Take 1 tablet (10 mg total) by mouth every 8 (eight) hours as needed. 270 tablet 0   polyethylene glycol (MIRALAX / GLYCOLAX) packet Take 17 g by mouth once a week. Mix with liquid and drink     No facility-administered medications prior to visit.    PAST MEDICAL HISTORY: Past Medical History:  Diagnosis Date   Anxiety    Arthritis    "probably" (08/25/2012)   Breast cancer of lower-outer quadrant of left female breast (HCC) 05/25/2014   Headache(784.0)    "not real frequent" (  08/25/2012)   Heart disease    HTN (hypertension)    Hyperlipidemia    Meningioma (HCC) 1999   Myocardial infarction (HCC) 1987   Paresis of lower extremity (HCC) 1999   BLE/notes 08/25/2012   S/P radiation therapy 07/30/14-08/30/13   left breast cancer/50Gy   Seizures (HCC)    no seizures since 2014   Spastic paraparesis    S/P meningioma resection 1999/notes 08/25/2012   Stroke (HCC)    x 2, the last 2014    PAST SURGICAL HISTORY: Past Surgical History:  Procedure Laterality Date   BRAIN MENINGIOMA EXCISION  05/1997   BREAST LUMPECTOMY WITH NEEDLE LOCALIZATION AND AXILLARY SENTINEL LYMPH NODE BX Left 06/22/2014   Procedure: LEFT WIRE LOCALIZED LUMPECTOPMY AND SENTINEL NODE MAPPING;  Surgeon: Chevis Pretty III, MD;  Location: [facility] OR;  Service: General;  Laterality: Left;   CARDIAC CATHETERIZATION  1987   CORONARY ANGIOPLASTY  1987   denied intervention: "blockage wasn't bad enough"   LEFT HEART CATH AND CORONARY ANGIOGRAPHY N/A 07/08/2021   Procedure: LEFT HEART CATH AND CORONARY ANGIOGRAPHY;  Surgeon: Lennette Bihari, MD;  Location: [facility] INVASIVE CV LAB;  Service: Cardiovascular;  Laterality: N/A;   TIBIA IM NAIL INSERTION Right 04/26/2018   Procedure: INTRAMEDULLARY  (IM) NAIL TIBIAL;  Surgeon: Myrene Galas, MD;  Location: [facility] OR;  Service: Orthopedics;  Laterality: Right;    FAMILY HISTORY: Family History  Problem Relation Age of Onset   Breast cancer Mother 45       unilateral   Heart disease Father    Breast cancer Paternal Grandmother 16       unilateral    SOCIAL HISTORY: Social History   Socioeconomic History   Marital status: Married    Spouse name: david   Number of children: 2   Years of education: college   Highest education level: Bachelor's degree (e.g., BA, AB, BS)  Occupational History   Occupation: disabled  Tobacco Use   Smoking status: Former    Packs/day: 2.00    Years: 10.00    Additional pack years: 0.00    Total pack years: 20.00    Types: Cigarettes   Smokeless tobacco: Never   Tobacco comments:    08/25/2012 "quit smoking cigarettes in 1985 when I had my heart attack"  Vaping Use   Vaping Use: Never used  Substance and Sexual Activity   Alcohol use: No    Alcohol/week: 7.0 standard drinks of alcohol    Types: 7 Glasses of wine per week    Comment: occasionally   Drug use: No   Sexual activity: Never  Other Topics Concern   Not on file  Social History Narrative   Not on file   Social Determinants of Health   Financial Resource Strain: Medium Risk (08/17/2021)   Overall Financial Resource Strain (CARDIA)    Difficulty of Paying Living Expenses: Somewhat hard  Food Insecurity: No Food Insecurity (09/04/2022)   Hunger Vital Sign    Worried About Running Out of Food in the Last Year: Never true    Ran Out of Food in the Last Year: Never true  Transportation Needs: No Transportation Needs (09/04/2022)   PRAPARE - Administrator, Civil Service (Medical): No    Lack of Transportation (Non-Medical): No  Physical Activity: Unknown (08/17/2021)   Exercise Vital Sign    Days of Exercise per Week: 0 days    Minutes of Exercise per Session: Not on file  Stress: Stress Concern Present (08/17/2021)  [employer] of Occupational Health - Occupational Stress Questionnaire    Feeling of Stress : Rather much  Social Connections: Unknown (08/17/2021)   Social Connection and Isolation Panel [NHANES]    Frequency of Communication with Friends and Family: Three times a week    Frequency of Social Gatherings with Friends and Family: Once a week    Attends Religious Services: Patient declined    Database administrator or Organizations: Yes    Attends Banker Meetings: Patient declined    Marital Status: Married  Catering manager Violence: Not on file      PHYSICAL EXAM Generalized: Well developed, in no acute distress, in a hospital bed  Neurological examination  Mentation: Alert oriented to time, place, history taking. Follows all commands speech and language fluent Cranial nerve II-XII: Facial symmetry noted Reflexes: UTA  DIAGNOSTIC DATA (LABS, IMAGING, TESTING) - I reviewed patient records, labs, notes, testing and imaging myself where available.  Lab Results  Component Value Date   WBC 12.5 (H) 07/27/2022   HGB 15.3 (H) 07/27/2022   HCT 48.6 (H) 07/27/2022   MCV 97.2 07/27/2022   PLT 258 07/27/2022      Component Value Date/Time   NA 138 07/27/2022 1321   NA 128 (L) 07/04/2021 1459   NA 143 05/30/2014 1212   K 4.1 07/27/2022 1321   K 3.5 05/30/2014 1212   CL 105 07/27/2022 1321   CO2 22 07/27/2022 1321   CO2 28 05/30/2014 1212   GLUCOSE 101 (H) 07/27/2022 1321   GLUCOSE 109 05/30/2014 1212   BUN 9 07/27/2022 1321   BUN 7 (L) 07/04/2021 1459   BUN 14.6 05/30/2014 1212   CREATININE 0.57 07/27/2022 1321   CREATININE 0.67 09/25/2020 1403   CREATININE 0.56 03/08/2020 1524   CREATININE 0.7 05/30/2014 1212   CALCIUM 9.3 07/27/2022 1321   CALCIUM 8.9 04/28/2018 0346   CALCIUM 9.9 05/30/2014 1212   PROT 7.5 07/27/2022 1321   PROT 7.1 05/30/2014 1212   ALBUMIN 3.6 07/27/2022 1321   ALBUMIN 3.8 05/30/2014 1212   AST 24 07/27/2022 1321   AST 21  09/25/2020 1403   AST 26 05/30/2014 1212   ALT 31 07/27/2022 1321   ALT 22 09/25/2020 1403   ALT 34 05/30/2014 1212   ALKPHOS 75 07/27/2022 1321   ALKPHOS 75 05/30/2014 1212   BILITOT 0.4 07/27/2022 1321   BILITOT 0.6 09/25/2020 1403   BILITOT 0.49 05/30/2014 1212   GFRNONAA >60 07/27/2022 1321   GFRNONAA >60 09/25/2020 1403   GFRAA >60 09/13/2019 1016   Lab Results  Component Value Date   CHOL 145 04/26/2021   HDL 66 04/26/2021   LDLCALC 67 04/26/2021   LDLDIRECT 142.6 03/26/2011   TRIG 62 04/26/2021   CHOLHDL 2.2 04/26/2021   Lab Results  Component Value Date   HGBA1C 5.2 04/26/2021   Lab Results  Component Value Date   VITAMINB12 453 03/08/2020   Lab Results  Component Value Date   TSH 0.372 12/22/2021      ASSESSMENT AND PLAN 72 y.o. year old female  has a past medical history of Anxiety, Arthritis, Breast cancer of lower-outer quadrant of left female breast (HCC) (05/25/2014), Headache(784.0), Heart disease, HTN (hypertension), Hyperlipidemia, Meningioma (HCC) (1999), Myocardial infarction (HCC) (1987), Paresis of lower extremity (HCC) (1999), S/P radiation therapy (07/30/14-08/30/13), Seizures (HCC), Spastic paraparesis, and Stroke (HCC). here with:  1.  Seizures 2.  Possible TIA or stroke  -Continue Vimpat 200 mg twice a day -Continue  Keppra 500 mg twice a day -Discussed stroke risk factors.  She remains on Plavix 75 mg daily -Blood pressure goal <130/90 -Cholesterol LDL goal <70 -Diabetes goal A1c <7 -Advised the patient that I would discuss with Dr. Pearlean BrownieSethi to ensure no additional workup is recommended.    Butch PennyMegan Bertie Mcconathy, MSN, NP-C 11/26/2022, 3:17 PM [facility] [address], [address] [place], [place] [address] [phone]

## 2022-11-30 DIAGNOSIS — I110 Hypertensive heart disease with heart failure: Secondary | ICD-10-CM | POA: Diagnosis not present

## 2022-11-30 DIAGNOSIS — Z0189 Encounter for other specified special examinations: Secondary | ICD-10-CM | POA: Diagnosis not present

## 2022-11-30 DIAGNOSIS — E785 Hyperlipidemia, unspecified: Secondary | ICD-10-CM | POA: Diagnosis not present

## 2022-12-01 MED ORDER — LEVETIRACETAM 750 MG PO TABS: 750.0000 mg | ORAL_TABLET | Freq: Two times a day (BID) | ORAL | 12 refills | Status: DC

## 2022-12-01 NOTE — Progress Notes (Signed)
Spoke with Dr. Bascom Levels recommends we try increasing Keppra to 750 mg twice a day I called the patient and spoke with her husband.  He is amenable to trying this.  Also explained that it is hard to decipher whether she is having stroke or TIA events versus seizures without imaging or EEG.  He voiced understanding.  We will try the increased dose of Keppra.  Advised that if it makes her too drowsy we can switch to extended release

## 2022-12-01 NOTE — Addendum Note (Signed)
Addended by: Enedina Finner on: 12/01/2022 09:27 AM   Modules accepted: Orders

## 2022-12-07 DIAGNOSIS — F419 Anxiety disorder, unspecified: Secondary | ICD-10-CM | POA: Diagnosis not present

## 2022-12-07 DIAGNOSIS — I110 Hypertensive heart disease with heart failure: Secondary | ICD-10-CM | POA: Diagnosis not present

## 2022-12-07 DIAGNOSIS — N390 Urinary tract infection, site not specified: Secondary | ICD-10-CM | POA: Diagnosis not present

## 2022-12-10 DIAGNOSIS — Z853 Personal history of malignant neoplasm of breast: Secondary | ICD-10-CM | POA: Diagnosis not present

## 2022-12-10 DIAGNOSIS — Z7401 Bed confinement status: Secondary | ICD-10-CM | POA: Diagnosis not present

## 2022-12-10 DIAGNOSIS — I110 Hypertensive heart disease with heart failure: Secondary | ICD-10-CM | POA: Diagnosis not present

## 2022-12-10 DIAGNOSIS — E785 Hyperlipidemia, unspecified: Secondary | ICD-10-CM | POA: Diagnosis not present

## 2022-12-10 DIAGNOSIS — F419 Anxiety disorder, unspecified: Secondary | ICD-10-CM | POA: Diagnosis not present

## 2022-12-10 DIAGNOSIS — R54 Age-related physical debility: Secondary | ICD-10-CM | POA: Diagnosis not present

## 2022-12-10 DIAGNOSIS — Z87311 Personal history of (healed) other pathological fracture: Secondary | ICD-10-CM | POA: Diagnosis not present

## 2022-12-10 DIAGNOSIS — Z87440 Personal history of urinary (tract) infections: Secondary | ICD-10-CM | POA: Diagnosis not present

## 2022-12-10 DIAGNOSIS — M810 Age-related osteoporosis without current pathological fracture: Secondary | ICD-10-CM | POA: Diagnosis not present

## 2022-12-15 DIAGNOSIS — I110 Hypertensive heart disease with heart failure: Secondary | ICD-10-CM | POA: Diagnosis not present

## 2022-12-15 DIAGNOSIS — E785 Hyperlipidemia, unspecified: Secondary | ICD-10-CM | POA: Diagnosis not present

## 2022-12-15 DIAGNOSIS — R54 Age-related physical debility: Secondary | ICD-10-CM | POA: Diagnosis not present

## 2022-12-15 DIAGNOSIS — M1990 Unspecified osteoarthritis, unspecified site: Secondary | ICD-10-CM | POA: Diagnosis not present

## 2022-12-16 DIAGNOSIS — F419 Anxiety disorder, unspecified: Secondary | ICD-10-CM | POA: Diagnosis not present

## 2022-12-16 DIAGNOSIS — G8191 Hemiplegia, unspecified affecting right dominant side: Secondary | ICD-10-CM | POA: Diagnosis not present

## 2022-12-16 DIAGNOSIS — M1990 Unspecified osteoarthritis, unspecified site: Secondary | ICD-10-CM | POA: Diagnosis not present

## 2022-12-16 DIAGNOSIS — R41841 Cognitive communication deficit: Secondary | ICD-10-CM | POA: Diagnosis not present

## 2022-12-16 DIAGNOSIS — I252 Old myocardial infarction: Secondary | ICD-10-CM | POA: Diagnosis not present

## 2022-12-16 DIAGNOSIS — I69339 Monoplegia of upper limb following cerebral infarction affecting unspecified side: Secondary | ICD-10-CM | POA: Diagnosis not present

## 2022-12-16 DIAGNOSIS — I119 Hypertensive heart disease without heart failure: Secondary | ICD-10-CM | POA: Diagnosis not present

## 2022-12-16 DIAGNOSIS — I2510 Atherosclerotic heart disease of native coronary artery without angina pectoris: Secondary | ICD-10-CM | POA: Diagnosis not present

## 2022-12-16 DIAGNOSIS — G40509 Epileptic seizures related to external causes, not intractable, without status epilepticus: Secondary | ICD-10-CM | POA: Diagnosis not present

## 2022-12-16 DIAGNOSIS — E785 Hyperlipidemia, unspecified: Secondary | ICD-10-CM | POA: Diagnosis not present

## 2023-01-04 DIAGNOSIS — R54 Age-related physical debility: Secondary | ICD-10-CM | POA: Diagnosis not present

## 2023-01-04 DIAGNOSIS — F411 Generalized anxiety disorder: Secondary | ICD-10-CM | POA: Diagnosis not present

## 2023-01-04 DIAGNOSIS — Z87440 Personal history of urinary (tract) infections: Secondary | ICD-10-CM | POA: Diagnosis not present

## 2023-01-04 DIAGNOSIS — I110 Hypertensive heart disease with heart failure: Secondary | ICD-10-CM | POA: Diagnosis not present

## 2023-01-04 DIAGNOSIS — Z7401 Bed confinement status: Secondary | ICD-10-CM | POA: Diagnosis not present

## 2023-01-11 DIAGNOSIS — G894 Chronic pain syndrome: Secondary | ICD-10-CM | POA: Diagnosis not present

## 2023-01-11 DIAGNOSIS — R54 Age-related physical debility: Secondary | ICD-10-CM | POA: Diagnosis not present

## 2023-01-11 DIAGNOSIS — I110 Hypertensive heart disease with heart failure: Secondary | ICD-10-CM | POA: Diagnosis not present

## 2023-01-11 DIAGNOSIS — E785 Hyperlipidemia, unspecified: Secondary | ICD-10-CM | POA: Diagnosis not present

## 2023-01-11 DIAGNOSIS — M1990 Unspecified osteoarthritis, unspecified site: Secondary | ICD-10-CM | POA: Diagnosis not present

## 2023-01-19 ENCOUNTER — Telehealth: Payer: Self-pay | Admitting: *Deleted

## 2023-01-19 DIAGNOSIS — Z170 Estrogen receptor positive status [ER+]: Secondary | ICD-10-CM

## 2023-01-19 DIAGNOSIS — Z9889 Other specified postprocedural states: Secondary | ICD-10-CM

## 2023-01-19 DIAGNOSIS — G40209 Localization-related (focal) (partial) symptomatic epilepsy and epileptic syndromes with complex partial seizures, not intractable, without status epilepticus: Secondary | ICD-10-CM

## 2023-01-19 DIAGNOSIS — I10 Essential (primary) hypertension: Secondary | ICD-10-CM

## 2023-01-19 DIAGNOSIS — I69839 Monoplegia of upper limb following other cerebrovascular disease affecting unspecified side: Secondary | ICD-10-CM

## 2023-01-19 DIAGNOSIS — Z8603 Personal history of neoplasm of uncertain behavior: Secondary | ICD-10-CM

## 2023-01-19 DIAGNOSIS — G40909 Epilepsy, unspecified, not intractable, without status epilepticus: Secondary | ICD-10-CM

## 2023-01-19 DIAGNOSIS — G8220 Paraplegia, unspecified: Secondary | ICD-10-CM

## 2023-01-19 DIAGNOSIS — Z8673 Personal history of transient ischemic attack (TIA), and cerebral infarction without residual deficits: Secondary | ICD-10-CM

## 2023-01-19 DIAGNOSIS — E785 Hyperlipidemia, unspecified: Secondary | ICD-10-CM

## 2023-01-19 NOTE — Telephone Encounter (Signed)
-----   Message from Angela H Herring, RPH sent at 01/19/2023  9:39 AM EDT ----- Regarding: 2300 Pharmacy Referral Hello,   Patient is currently flagged as high risk for hospitalization and is eligible to meet with me through the Care Coordination program.   Could a 2300-Pharmacy referral for medication management be placed for the patient so that I can get them scheduled?   Thank you!  Angela H Herring  Clinical Pharmacist  [phone]    

## 2023-01-29 DIAGNOSIS — M25552 Pain in left hip: Secondary | ICD-10-CM | POA: Diagnosis not present

## 2023-02-01 DIAGNOSIS — I110 Hypertensive heart disease with heart failure: Secondary | ICD-10-CM | POA: Diagnosis not present

## 2023-02-01 DIAGNOSIS — R54 Age-related physical debility: Secondary | ICD-10-CM | POA: Diagnosis not present

## 2023-02-01 DIAGNOSIS — F411 Generalized anxiety disorder: Secondary | ICD-10-CM | POA: Diagnosis not present

## 2023-02-01 DIAGNOSIS — Z7401 Bed confinement status: Secondary | ICD-10-CM | POA: Diagnosis not present

## 2023-02-01 DIAGNOSIS — Z87440 Personal history of urinary (tract) infections: Secondary | ICD-10-CM | POA: Diagnosis not present

## 2023-02-01 DIAGNOSIS — M25552 Pain in left hip: Secondary | ICD-10-CM | POA: Diagnosis not present

## 2023-02-03 DIAGNOSIS — M1612 Unilateral primary osteoarthritis, left hip: Secondary | ICD-10-CM | POA: Diagnosis not present

## 2023-02-12 DIAGNOSIS — R54 Age-related physical debility: Secondary | ICD-10-CM | POA: Diagnosis not present

## 2023-02-12 DIAGNOSIS — E785 Hyperlipidemia, unspecified: Secondary | ICD-10-CM | POA: Diagnosis not present

## 2023-02-12 DIAGNOSIS — M1990 Unspecified osteoarthritis, unspecified site: Secondary | ICD-10-CM | POA: Diagnosis not present

## 2023-02-12 DIAGNOSIS — G894 Chronic pain syndrome: Secondary | ICD-10-CM | POA: Diagnosis not present

## 2023-02-12 DIAGNOSIS — I110 Hypertensive heart disease with heart failure: Secondary | ICD-10-CM | POA: Diagnosis not present

## 2023-02-12 DIAGNOSIS — Z8673 Personal history of transient ischemic attack (TIA), and cerebral infarction without residual deficits: Secondary | ICD-10-CM | POA: Diagnosis not present

## 2023-03-01 DIAGNOSIS — R54 Age-related physical debility: Secondary | ICD-10-CM | POA: Diagnosis not present

## 2023-03-01 DIAGNOSIS — I110 Hypertensive heart disease with heart failure: Secondary | ICD-10-CM | POA: Diagnosis not present

## 2023-03-01 DIAGNOSIS — F419 Anxiety disorder, unspecified: Secondary | ICD-10-CM | POA: Diagnosis not present

## 2023-03-01 DIAGNOSIS — M150 Primary generalized (osteo)arthritis: Secondary | ICD-10-CM | POA: Diagnosis not present

## 2023-03-09 ENCOUNTER — Other Ambulatory Visit: Payer: Self-pay | Admitting: Internal Medicine

## 2023-03-09 DIAGNOSIS — I10 Essential (primary) hypertension: Secondary | ICD-10-CM

## 2023-03-09 DIAGNOSIS — G8220 Paraplegia, unspecified: Secondary | ICD-10-CM

## 2023-03-09 DIAGNOSIS — Z8673 Personal history of transient ischemic attack (TIA), and cerebral infarction without residual deficits: Secondary | ICD-10-CM

## 2023-03-12 ENCOUNTER — Other Ambulatory Visit: Payer: Self-pay | Admitting: Internal Medicine

## 2023-03-15 DIAGNOSIS — E785 Hyperlipidemia, unspecified: Secondary | ICD-10-CM | POA: Diagnosis not present

## 2023-03-15 DIAGNOSIS — R54 Age-related physical debility: Secondary | ICD-10-CM | POA: Diagnosis not present

## 2023-03-15 DIAGNOSIS — G894 Chronic pain syndrome: Secondary | ICD-10-CM | POA: Diagnosis not present

## 2023-03-15 DIAGNOSIS — I110 Hypertensive heart disease with heart failure: Secondary | ICD-10-CM | POA: Diagnosis not present

## 2023-03-15 DIAGNOSIS — M1990 Unspecified osteoarthritis, unspecified site: Secondary | ICD-10-CM | POA: Diagnosis not present

## 2023-03-16 DIAGNOSIS — M1990 Unspecified osteoarthritis, unspecified site: Secondary | ICD-10-CM | POA: Diagnosis not present

## 2023-03-16 DIAGNOSIS — G8929 Other chronic pain: Secondary | ICD-10-CM | POA: Diagnosis not present

## 2023-03-16 DIAGNOSIS — Z7401 Bed confinement status: Secondary | ICD-10-CM | POA: Diagnosis not present

## 2023-03-16 DIAGNOSIS — I5022 Chronic systolic (congestive) heart failure: Secondary | ICD-10-CM | POA: Diagnosis not present

## 2023-03-16 DIAGNOSIS — I639 Cerebral infarction, unspecified: Secondary | ICD-10-CM | POA: Diagnosis not present

## 2023-03-16 DIAGNOSIS — Z8673 Personal history of transient ischemic attack (TIA), and cerebral infarction without residual deficits: Secondary | ICD-10-CM | POA: Diagnosis not present

## 2023-04-05 DIAGNOSIS — F419 Anxiety disorder, unspecified: Secondary | ICD-10-CM | POA: Diagnosis not present

## 2023-04-05 DIAGNOSIS — Z853 Personal history of malignant neoplasm of breast: Secondary | ICD-10-CM | POA: Diagnosis not present

## 2023-04-05 DIAGNOSIS — G894 Chronic pain syndrome: Secondary | ICD-10-CM | POA: Diagnosis not present

## 2023-04-05 DIAGNOSIS — R54 Age-related physical debility: Secondary | ICD-10-CM | POA: Diagnosis not present

## 2023-04-05 DIAGNOSIS — M150 Primary generalized (osteo)arthritis: Secondary | ICD-10-CM | POA: Diagnosis not present

## 2023-04-05 DIAGNOSIS — I10 Essential (primary) hypertension: Secondary | ICD-10-CM | POA: Diagnosis not present

## 2023-04-05 DIAGNOSIS — M6281 Muscle weakness (generalized): Secondary | ICD-10-CM | POA: Diagnosis not present

## 2023-04-12 DIAGNOSIS — U071 COVID-19: Secondary | ICD-10-CM | POA: Diagnosis not present

## 2023-04-16 DIAGNOSIS — Z853 Personal history of malignant neoplasm of breast: Secondary | ICD-10-CM | POA: Diagnosis not present

## 2023-04-16 DIAGNOSIS — I639 Cerebral infarction, unspecified: Secondary | ICD-10-CM | POA: Diagnosis not present

## 2023-04-16 DIAGNOSIS — Z8673 Personal history of transient ischemic attack (TIA), and cerebral infarction without residual deficits: Secondary | ICD-10-CM | POA: Diagnosis not present

## 2023-04-16 DIAGNOSIS — G8929 Other chronic pain: Secondary | ICD-10-CM | POA: Diagnosis not present

## 2023-04-16 DIAGNOSIS — R54 Age-related physical debility: Secondary | ICD-10-CM | POA: Diagnosis not present

## 2023-04-16 DIAGNOSIS — I110 Hypertensive heart disease with heart failure: Secondary | ICD-10-CM | POA: Diagnosis not present

## 2023-04-16 DIAGNOSIS — M1990 Unspecified osteoarthritis, unspecified site: Secondary | ICD-10-CM | POA: Diagnosis not present

## 2023-04-16 DIAGNOSIS — G894 Chronic pain syndrome: Secondary | ICD-10-CM | POA: Diagnosis not present

## 2023-04-16 DIAGNOSIS — I5022 Chronic systolic (congestive) heart failure: Secondary | ICD-10-CM | POA: Diagnosis not present

## 2023-04-16 DIAGNOSIS — Z7401 Bed confinement status: Secondary | ICD-10-CM | POA: Diagnosis not present

## 2023-04-16 DIAGNOSIS — E785 Hyperlipidemia, unspecified: Secondary | ICD-10-CM | POA: Diagnosis not present

## 2023-05-03 DIAGNOSIS — B379 Candidiasis, unspecified: Secondary | ICD-10-CM | POA: Diagnosis not present

## 2023-05-03 DIAGNOSIS — R54 Age-related physical debility: Secondary | ICD-10-CM | POA: Diagnosis not present

## 2023-05-03 DIAGNOSIS — F419 Anxiety disorder, unspecified: Secondary | ICD-10-CM | POA: Diagnosis not present

## 2023-05-03 DIAGNOSIS — M810 Age-related osteoporosis without current pathological fracture: Secondary | ICD-10-CM | POA: Diagnosis not present

## 2023-05-03 DIAGNOSIS — M6281 Muscle weakness (generalized): Secondary | ICD-10-CM | POA: Diagnosis not present

## 2023-05-03 DIAGNOSIS — Z7401 Bed confinement status: Secondary | ICD-10-CM | POA: Diagnosis not present

## 2023-05-03 DIAGNOSIS — I110 Hypertensive heart disease with heart failure: Secondary | ICD-10-CM | POA: Diagnosis not present

## 2023-05-03 DIAGNOSIS — N390 Urinary tract infection, site not specified: Secondary | ICD-10-CM | POA: Diagnosis not present

## 2023-05-03 DIAGNOSIS — Z853 Personal history of malignant neoplasm of breast: Secondary | ICD-10-CM | POA: Diagnosis not present

## 2023-05-10 DIAGNOSIS — G894 Chronic pain syndrome: Secondary | ICD-10-CM | POA: Diagnosis not present

## 2023-05-10 DIAGNOSIS — I110 Hypertensive heart disease with heart failure: Secondary | ICD-10-CM | POA: Diagnosis not present

## 2023-05-10 DIAGNOSIS — E785 Hyperlipidemia, unspecified: Secondary | ICD-10-CM | POA: Diagnosis not present

## 2023-05-10 DIAGNOSIS — M1990 Unspecified osteoarthritis, unspecified site: Secondary | ICD-10-CM | POA: Diagnosis not present

## 2023-05-10 DIAGNOSIS — R54 Age-related physical debility: Secondary | ICD-10-CM | POA: Diagnosis not present

## 2023-05-10 DIAGNOSIS — Z853 Personal history of malignant neoplasm of breast: Secondary | ICD-10-CM | POA: Diagnosis not present

## 2023-05-17 DIAGNOSIS — I639 Cerebral infarction, unspecified: Secondary | ICD-10-CM | POA: Diagnosis not present

## 2023-05-17 DIAGNOSIS — I5022 Chronic systolic (congestive) heart failure: Secondary | ICD-10-CM | POA: Diagnosis not present

## 2023-05-17 DIAGNOSIS — Z8673 Personal history of transient ischemic attack (TIA), and cerebral infarction without residual deficits: Secondary | ICD-10-CM | POA: Diagnosis not present

## 2023-05-17 DIAGNOSIS — G8929 Other chronic pain: Secondary | ICD-10-CM | POA: Diagnosis not present

## 2023-05-17 DIAGNOSIS — M1990 Unspecified osteoarthritis, unspecified site: Secondary | ICD-10-CM | POA: Diagnosis not present

## 2023-05-17 DIAGNOSIS — Z7401 Bed confinement status: Secondary | ICD-10-CM | POA: Diagnosis not present

## 2023-05-25 ENCOUNTER — Other Ambulatory Visit: Payer: Self-pay | Admitting: Adult Health

## 2023-05-25 DIAGNOSIS — R569 Unspecified convulsions: Secondary | ICD-10-CM

## 2023-05-25 NOTE — Telephone Encounter (Signed)
Last visit 11/26/22 Next visit TBD  Per adherence registry, last filled:   Rx refill sent to Dr Epimenio Foot as work-in provider this afternoon.

## 2023-06-03 DIAGNOSIS — Z7401 Bed confinement status: Secondary | ICD-10-CM | POA: Diagnosis not present

## 2023-06-03 DIAGNOSIS — N390 Urinary tract infection, site not specified: Secondary | ICD-10-CM | POA: Diagnosis not present

## 2023-06-03 DIAGNOSIS — M810 Age-related osteoporosis without current pathological fracture: Secondary | ICD-10-CM | POA: Diagnosis not present

## 2023-06-03 DIAGNOSIS — R54 Age-related physical debility: Secondary | ICD-10-CM | POA: Diagnosis not present

## 2023-06-03 DIAGNOSIS — Z87440 Personal history of urinary (tract) infections: Secondary | ICD-10-CM | POA: Diagnosis not present

## 2023-06-03 DIAGNOSIS — Z87311 Personal history of (healed) other pathological fracture: Secondary | ICD-10-CM | POA: Diagnosis not present

## 2023-06-03 DIAGNOSIS — F419 Anxiety disorder, unspecified: Secondary | ICD-10-CM | POA: Diagnosis not present

## 2023-06-03 DIAGNOSIS — Z853 Personal history of malignant neoplasm of breast: Secondary | ICD-10-CM | POA: Diagnosis not present

## 2023-06-03 DIAGNOSIS — I110 Hypertensive heart disease with heart failure: Secondary | ICD-10-CM | POA: Diagnosis not present

## 2023-06-08 ENCOUNTER — Encounter: Payer: Self-pay | Admitting: Hematology and Oncology

## 2023-06-16 DIAGNOSIS — I639 Cerebral infarction, unspecified: Secondary | ICD-10-CM | POA: Diagnosis not present

## 2023-06-16 DIAGNOSIS — Z8673 Personal history of transient ischemic attack (TIA), and cerebral infarction without residual deficits: Secondary | ICD-10-CM | POA: Diagnosis not present

## 2023-06-16 DIAGNOSIS — M1990 Unspecified osteoarthritis, unspecified site: Secondary | ICD-10-CM | POA: Diagnosis not present

## 2023-06-16 DIAGNOSIS — I5022 Chronic systolic (congestive) heart failure: Secondary | ICD-10-CM | POA: Diagnosis not present

## 2023-06-16 DIAGNOSIS — Z7401 Bed confinement status: Secondary | ICD-10-CM | POA: Diagnosis not present

## 2023-06-16 DIAGNOSIS — G8929 Other chronic pain: Secondary | ICD-10-CM | POA: Diagnosis not present

## 2023-06-17 DIAGNOSIS — M1990 Unspecified osteoarthritis, unspecified site: Secondary | ICD-10-CM | POA: Diagnosis not present

## 2023-06-17 DIAGNOSIS — E785 Hyperlipidemia, unspecified: Secondary | ICD-10-CM | POA: Diagnosis not present

## 2023-06-17 DIAGNOSIS — R54 Age-related physical debility: Secondary | ICD-10-CM | POA: Diagnosis not present

## 2023-06-17 DIAGNOSIS — I110 Hypertensive heart disease with heart failure: Secondary | ICD-10-CM | POA: Diagnosis not present

## 2023-06-17 DIAGNOSIS — M810 Age-related osteoporosis without current pathological fracture: Secondary | ICD-10-CM | POA: Diagnosis not present

## 2023-06-17 DIAGNOSIS — Z853 Personal history of malignant neoplasm of breast: Secondary | ICD-10-CM | POA: Diagnosis not present

## 2023-06-17 DIAGNOSIS — G894 Chronic pain syndrome: Secondary | ICD-10-CM | POA: Diagnosis not present

## 2023-06-24 ENCOUNTER — Other Ambulatory Visit: Payer: Self-pay | Admitting: Neurology

## 2023-06-24 DIAGNOSIS — R569 Unspecified convulsions: Secondary | ICD-10-CM

## 2023-06-24 NOTE — Telephone Encounter (Signed)
Last seen on 11/26/22 No follow up schedule Last filled on 05/26/23 #120 tablets  Rx pending to be signed

## 2023-06-28 DIAGNOSIS — R54 Age-related physical debility: Secondary | ICD-10-CM | POA: Diagnosis not present

## 2023-06-28 DIAGNOSIS — Z7401 Bed confinement status: Secondary | ICD-10-CM | POA: Diagnosis not present

## 2023-06-28 DIAGNOSIS — B3731 Acute candidiasis of vulva and vagina: Secondary | ICD-10-CM | POA: Diagnosis not present

## 2023-06-28 DIAGNOSIS — Z853 Personal history of malignant neoplasm of breast: Secondary | ICD-10-CM | POA: Diagnosis not present

## 2023-06-28 DIAGNOSIS — N390 Urinary tract infection, site not specified: Secondary | ICD-10-CM | POA: Diagnosis not present

## 2023-06-28 DIAGNOSIS — R410 Disorientation, unspecified: Secondary | ICD-10-CM | POA: Diagnosis not present

## 2023-06-28 DIAGNOSIS — I110 Hypertensive heart disease with heart failure: Secondary | ICD-10-CM | POA: Diagnosis not present

## 2023-06-28 DIAGNOSIS — Z23 Encounter for immunization: Secondary | ICD-10-CM | POA: Diagnosis not present

## 2023-06-28 DIAGNOSIS — F419 Anxiety disorder, unspecified: Secondary | ICD-10-CM | POA: Diagnosis not present

## 2023-06-28 DIAGNOSIS — Z87311 Personal history of (healed) other pathological fracture: Secondary | ICD-10-CM | POA: Diagnosis not present

## 2023-06-28 DIAGNOSIS — M810 Age-related osteoporosis without current pathological fracture: Secondary | ICD-10-CM | POA: Diagnosis not present

## 2023-07-05 DIAGNOSIS — Z87440 Personal history of urinary (tract) infections: Secondary | ICD-10-CM | POA: Diagnosis not present

## 2023-07-05 DIAGNOSIS — R400 Somnolence: Secondary | ICD-10-CM | POA: Diagnosis not present

## 2023-07-14 DIAGNOSIS — G894 Chronic pain syndrome: Secondary | ICD-10-CM | POA: Diagnosis not present

## 2023-07-14 DIAGNOSIS — I110 Hypertensive heart disease with heart failure: Secondary | ICD-10-CM | POA: Diagnosis not present

## 2023-07-14 DIAGNOSIS — M1990 Unspecified osteoarthritis, unspecified site: Secondary | ICD-10-CM | POA: Diagnosis not present

## 2023-07-14 DIAGNOSIS — E785 Hyperlipidemia, unspecified: Secondary | ICD-10-CM | POA: Diagnosis not present

## 2023-07-14 DIAGNOSIS — M810 Age-related osteoporosis without current pathological fracture: Secondary | ICD-10-CM | POA: Diagnosis not present

## 2023-07-14 DIAGNOSIS — Z853 Personal history of malignant neoplasm of breast: Secondary | ICD-10-CM | POA: Diagnosis not present

## 2023-07-14 DIAGNOSIS — R54 Age-related physical debility: Secondary | ICD-10-CM | POA: Diagnosis not present

## 2023-07-17 DIAGNOSIS — I5022 Chronic systolic (congestive) heart failure: Secondary | ICD-10-CM | POA: Diagnosis not present

## 2023-07-17 DIAGNOSIS — M1990 Unspecified osteoarthritis, unspecified site: Secondary | ICD-10-CM | POA: Diagnosis not present

## 2023-07-17 DIAGNOSIS — I639 Cerebral infarction, unspecified: Secondary | ICD-10-CM | POA: Diagnosis not present

## 2023-07-17 DIAGNOSIS — Z8673 Personal history of transient ischemic attack (TIA), and cerebral infarction without residual deficits: Secondary | ICD-10-CM | POA: Diagnosis not present

## 2023-07-17 DIAGNOSIS — G8929 Other chronic pain: Secondary | ICD-10-CM | POA: Diagnosis not present

## 2023-07-17 DIAGNOSIS — Z7401 Bed confinement status: Secondary | ICD-10-CM | POA: Diagnosis not present

## 2023-07-23 DIAGNOSIS — M810 Age-related osteoporosis without current pathological fracture: Secondary | ICD-10-CM | POA: Diagnosis not present

## 2023-07-23 DIAGNOSIS — M1990 Unspecified osteoarthritis, unspecified site: Secondary | ICD-10-CM | POA: Diagnosis not present

## 2023-07-23 DIAGNOSIS — G894 Chronic pain syndrome: Secondary | ICD-10-CM | POA: Diagnosis not present

## 2023-07-23 DIAGNOSIS — R54 Age-related physical debility: Secondary | ICD-10-CM | POA: Diagnosis not present

## 2023-07-23 DIAGNOSIS — Z853 Personal history of malignant neoplasm of breast: Secondary | ICD-10-CM | POA: Diagnosis not present

## 2023-07-23 DIAGNOSIS — I110 Hypertensive heart disease with heart failure: Secondary | ICD-10-CM | POA: Diagnosis not present

## 2023-07-23 DIAGNOSIS — E785 Hyperlipidemia, unspecified: Secondary | ICD-10-CM | POA: Diagnosis not present

## 2023-07-26 DIAGNOSIS — Z853 Personal history of malignant neoplasm of breast: Secondary | ICD-10-CM | POA: Diagnosis not present

## 2023-07-26 DIAGNOSIS — Z7401 Bed confinement status: Secondary | ICD-10-CM | POA: Diagnosis not present

## 2023-07-26 DIAGNOSIS — R54 Age-related physical debility: Secondary | ICD-10-CM | POA: Diagnosis not present

## 2023-07-26 DIAGNOSIS — F419 Anxiety disorder, unspecified: Secondary | ICD-10-CM | POA: Diagnosis not present

## 2023-07-26 DIAGNOSIS — I10 Essential (primary) hypertension: Secondary | ICD-10-CM | POA: Diagnosis not present

## 2023-08-16 DIAGNOSIS — I639 Cerebral infarction, unspecified: Secondary | ICD-10-CM | POA: Diagnosis not present

## 2023-08-16 DIAGNOSIS — I5022 Chronic systolic (congestive) heart failure: Secondary | ICD-10-CM | POA: Diagnosis not present

## 2023-08-16 DIAGNOSIS — G8929 Other chronic pain: Secondary | ICD-10-CM | POA: Diagnosis not present

## 2023-08-16 DIAGNOSIS — Z7401 Bed confinement status: Secondary | ICD-10-CM | POA: Diagnosis not present

## 2023-08-16 DIAGNOSIS — Z8673 Personal history of transient ischemic attack (TIA), and cerebral infarction without residual deficits: Secondary | ICD-10-CM | POA: Diagnosis not present

## 2023-08-16 DIAGNOSIS — M1990 Unspecified osteoarthritis, unspecified site: Secondary | ICD-10-CM | POA: Diagnosis not present

## 2023-08-21 ENCOUNTER — Other Ambulatory Visit: Payer: Self-pay | Admitting: Neurology

## 2023-08-23 DIAGNOSIS — R051 Acute cough: Secondary | ICD-10-CM | POA: Diagnosis not present

## 2023-08-23 DIAGNOSIS — G894 Chronic pain syndrome: Secondary | ICD-10-CM | POA: Diagnosis not present

## 2023-08-23 DIAGNOSIS — J40 Bronchitis, not specified as acute or chronic: Secondary | ICD-10-CM | POA: Diagnosis not present

## 2023-08-23 DIAGNOSIS — Z0001 Encounter for general adult medical examination with abnormal findings: Secondary | ICD-10-CM | POA: Diagnosis not present

## 2023-09-01 ENCOUNTER — Other Ambulatory Visit: Payer: Self-pay | Admitting: Internal Medicine

## 2023-09-01 DIAGNOSIS — G8220 Paraplegia, unspecified: Secondary | ICD-10-CM

## 2023-09-01 DIAGNOSIS — Z8673 Personal history of transient ischemic attack (TIA), and cerebral infarction without residual deficits: Secondary | ICD-10-CM

## 2023-09-08 ENCOUNTER — Other Ambulatory Visit: Payer: Self-pay | Admitting: Internal Medicine

## 2023-09-16 DIAGNOSIS — I639 Cerebral infarction, unspecified: Secondary | ICD-10-CM | POA: Diagnosis not present

## 2023-09-16 DIAGNOSIS — M810 Age-related osteoporosis without current pathological fracture: Secondary | ICD-10-CM | POA: Diagnosis not present

## 2023-09-16 DIAGNOSIS — I110 Hypertensive heart disease with heart failure: Secondary | ICD-10-CM | POA: Diagnosis not present

## 2023-09-16 DIAGNOSIS — Z7401 Bed confinement status: Secondary | ICD-10-CM | POA: Diagnosis not present

## 2023-09-16 DIAGNOSIS — M1990 Unspecified osteoarthritis, unspecified site: Secondary | ICD-10-CM | POA: Diagnosis not present

## 2023-09-16 DIAGNOSIS — R54 Age-related physical debility: Secondary | ICD-10-CM | POA: Diagnosis not present

## 2023-09-16 DIAGNOSIS — G894 Chronic pain syndrome: Secondary | ICD-10-CM | POA: Diagnosis not present

## 2023-09-16 DIAGNOSIS — Z8673 Personal history of transient ischemic attack (TIA), and cerebral infarction without residual deficits: Secondary | ICD-10-CM | POA: Diagnosis not present

## 2023-09-16 DIAGNOSIS — I5022 Chronic systolic (congestive) heart failure: Secondary | ICD-10-CM | POA: Diagnosis not present

## 2023-09-16 DIAGNOSIS — G8929 Other chronic pain: Secondary | ICD-10-CM | POA: Diagnosis not present

## 2023-09-16 DIAGNOSIS — E785 Hyperlipidemia, unspecified: Secondary | ICD-10-CM | POA: Diagnosis not present

## 2023-11-19 ENCOUNTER — Other Ambulatory Visit: Payer: Self-pay | Admitting: Adult Health

## 2023-11-30 ENCOUNTER — Other Ambulatory Visit: Payer: Self-pay | Admitting: Internal Medicine

## 2023-11-30 DIAGNOSIS — Z8673 Personal history of transient ischemic attack (TIA), and cerebral infarction without residual deficits: Secondary | ICD-10-CM

## 2023-11-30 DIAGNOSIS — G8220 Paraplegia, unspecified: Secondary | ICD-10-CM

## 2023-12-22 ENCOUNTER — Other Ambulatory Visit: Payer: Self-pay | Admitting: Adult Health

## 2023-12-22 DIAGNOSIS — R569 Unspecified convulsions: Secondary | ICD-10-CM

## 2023-12-22 NOTE — Telephone Encounter (Signed)
 Pt needs f/u appt.   Last seen 11-2022.  Last fill # 11-23-2023 #120.   I made appt for 12-28-2023 at 0900 VV. No seizures per husband.  Pt see's  Housecall MD due to transportation. They will check to see if they take over sz med rx.  For now needs 30 day.

## 2023-12-26 ENCOUNTER — Other Ambulatory Visit: Payer: Self-pay | Admitting: Adult Health

## 2023-12-28 ENCOUNTER — Telehealth (INDEPENDENT_AMBULATORY_CARE_PROVIDER_SITE_OTHER): Admitting: Adult Health

## 2023-12-28 DIAGNOSIS — R569 Unspecified convulsions: Secondary | ICD-10-CM

## 2023-12-28 MED ORDER — LACOSAMIDE 200 MG PO TABS: 200.0000 mg | ORAL_TABLET | Freq: Two times a day (BID) | ORAL | 1 refills | Status: DC

## 2023-12-28 MED ORDER — LEVETIRACETAM 750 MG PO TABS: 750.0000 mg | ORAL_TABLET | Freq: Two times a day (BID) | ORAL | 3 refills | Status: AC

## 2023-12-28 NOTE — Patient Instructions (Addendum)
 Your Plan:  Continue Keppra  750 mg twice a day Continue lacosamide  200 mg twice a day Advised to follow-up in 1 year or sooner if needed     Thank you for coming to see us  at [facility]. I hope we have been able to provide you high quality care today.  You may receive a patient satisfaction survey over the next few weeks. We would appreciate your feedback and comments so that we may continue to improve ourselves and the health of our patients.

## 2023-12-28 NOTE — Progress Notes (Signed)
 PATIENT: Stacy Diaz DOB: 1951-05-21  REASON FOR VISIT: follow up HISTORY FROM: patient  Virtual Visit via Video Note  I connected with Stacy Diaz on 12/28/23 at  9:00 AM EDT by a video enabled telemedicine application located remotely at [facility] and verified that I am speaking with the correct person using two identifiers who was located at their own home in [place]    I discussed the limitations of evaluation and management by telemedicine and the availability of in person appointments. The patient expressed understanding and agreed to proceed.   PATIENT: Stacy Diaz DOB: 1951/02/20  REASON FOR VISIT: follow up HISTORY FROM: patient  HISTORY OF PRESENT ILLNESS: Today 12/28/23:  Stacy Diaz is a 73 y.o. female with a history of seizures. Returns today for follow-up.  The patient is doing another virtual visit.  She is bedbound and they find it very difficult for transportation to get her here.  She does have regular follow-ups with her PCP.  At the last visit we increased her Keppra  to 750 mg twice a day.  She reports that she has not had any additional episodes.  Denies any numbness in the arms as she had reported before.  Reports that she is tolerating the medication well.  Also remains on lacosamide  200 twice a day.  Has reports that she just was recently placed on Bactrim  for UTI.  She returns today for an evaluation.  11/26/22: Stacy Diaz is a 73 y.o. female with a history of Seizure events. Returns today for follow-up.  She remains on Vimpat  200 mg twice a day and Keppra  500 mg twice a day.  She had an episode in December.  Reports that her right arm went numb.  She states that this lasted about 10 hours before it resolved.  She did not go to the emergency room initially.  She states 2 to 3 days later she had a bad cough and was worried about pneumonia so she went to the emergency room.  She was diagnosed with a UTI.  Her husband states  that since the initial event of right arm numbness she has not returned to her baseline.  He reports generalized weakness.  She is currently working with physical therapy.  She can transfer to a wheelchair but does not ambulate.  He states that each time she has an event she does not return to her original baseline.  Patient is bedbound therefore she cannot come to an appointment or do imaging per patient  HISTORY  05/06/22: [name] is a 73 year old female with a history of seizures.  She returns today for follow-up.  She is here today with her husband.  She reports that two weeks ago was complaining that left side of face was numb. Did not travel to arm like before. Lasted a few hours per husband.  She is currently taking Vimpat  100 mg in the morning and 200 mg in the evening and Keppra  500 mg twice a day.  She is in a motorized wheelchair.  She returns today for an evaluation.   HISTORY Patient requested this visit as she recently got home from prolonged hospital and rehab stay for seizures.73yo with a history of premature CVD and prior stroke, meningioma status post resection 1999 with spastic paraplegia requiring wheelchair, systolic CHF with a EF 40-45%, chronic pain, seizure disorder, breast cancer status post lumpectomy and radiation, and HTN who awoke the morning on 12/22/2021 unable to speak with  possible left arm weakness.  Code stroke was initiated upon ER arrival but upon evaluation neurology felt her symptoms most likely represented acute metabolic encephalopathy instead of stroke.  CT head showed old encephalomalacia and cerebral brain and chronic white matter changes and old craniectomy changes.  EEG showed cortical dysfunction in the left central parietal region and moderate diffuse encephalopathy.  No definite seizure activity35.  Neurology felt she had generalized encephalopathy from infection in setting of poor cognitive reserve with worsening of old symptoms but subsequently was noted to  have focal seizures and started on Keppra  initially and subsequently Vimpat  was added.  She was found to have severe sepsis due to E. coli and Proteus urinary tract infection.  She was treated initially with Rocephin  which was sensitive to both organisms subsequently switched to Keflex .  She was transferred to rehabilitation at [facility] which made gradual improvement and she is recently come home.  She states she had 1 more breakthrough seizure last Tuesday which was brief and did not last long.  She is tolerating Keppra  finder milligram twice daily as well as Vimpat  100 mg twice daily without any significant side effects.  She feels she has not returned back to her baseline and she cannot multitask and her cognition is not as sharp as it used to be and she has some trouble finding words and has to think about the words.  She is scheduled to start home physical and Occupational Therapy soon.   REVIEW OF SYSTEMS: Out of a complete 14 system review of symptoms, the patient complains only of the following symptoms, and all other reviewed systems are negative.  ALLERGIES: No Known Allergies  HOME MEDICATIONS: Outpatient Medications Prior to Visit  Medication Sig Dispense Refill   ASPERCREME LIDOCAINE  EX Apply 1 application topically daily as needed (muscle pain).     atorvastatin  (LIPITOR) 10 MG tablet TAKE 1 TABLET (10 MG TOTAL) BY MOUTH DAILY AT 6 PM. 90 tablet 1   baclofen  (LIORESAL ) 20 MG tablet TAKE 1 TABLET BY MOUTH FOUR TIMES A DAY 360 tablet 0   calcium  carbonate (OS-CAL - DOSED IN MG OF ELEMENTAL CALCIUM ) 1250 (500 Ca) MG tablet Take 1 tablet (500 mg of elemental calcium  total) by mouth 2 (two) times daily with a meal.     carboxymethylcellulose (REFRESH PLUS) 0.5 % SOLN Place 1 drop into both eyes every morning.     clopidogrel  (PLAVIX ) 75 MG tablet TAKE 1 TABLET BY MOUTH EVERY DAY 90 tablet 0   diazepam  (VALIUM ) 5 MG tablet TAKE 1+1/2 TABLETS IN THE AM AND 1 TAB AT BEDTIME 75 tablet 0    fluticasone  (FLONASE ) 50 MCG/ACT nasal spray Place 1 spray into both nostrils daily for 7 days. 15.8 mL 0   guaiFENesin  (ROBITUSSIN) 100 MG/5ML liquid Take 5 mLs by mouth every 4 (four) hours as needed for cough or to loosen phlegm. 120 mL 0   Lacosamide  100 MG TABS TAKE TWO TABLETS BY MOUTH TWICE DAILY 120 tablet 0   levETIRAcetam  (KEPPRA ) 500 MG tablet TAKE 1 TABLET BY MOUTH TWICE A DAY 180 tablet 1   levETIRAcetam  (KEPPRA ) 750 MG tablet Take 1 tablet (750 mg total) by mouth 2 (two) times daily. 60 tablet 12   lisinopril  (ZESTRIL ) 40 MG tablet TAKE 1 TABLET BY MOUTH EVERY DAY 90 tablet 1   liver oil-zinc  oxide (DESITIN) 40 % ointment Apply 1 application topically at bedtime. Apply to buttocks     melatonin 5 MG TABS Take 5 mg  by mouth at bedtime.     metoprolol  tartrate (LOPRESSOR ) 100 MG tablet Take 1 tablet (100 mg total) by mouth 2 (two) times daily. 180 tablet 1   Multiple Vitamin (MULTIVITAMIN WITH MINERALS) TABS tablet Take 1 tablet by mouth every morning.      Oxycodone  HCl 10 MG TABS Take 1 tablet (10 mg total) by mouth every 8 (eight) hours as needed. 270 tablet 0   polyethylene glycol (MIRALAX  / GLYCOLAX ) packet Take 17 g by mouth once a week. Mix with liquid and drink     No facility-administered medications prior to visit.    PAST MEDICAL HISTORY: Past Medical History:  Diagnosis Date   Anxiety    Arthritis    "probably" (08/25/2012)   Breast cancer of lower-outer quadrant of left female breast (HCC) 05/25/2014   Headache(784.0)    "not real frequent" (08/25/2012)   Heart disease    HTN (hypertension)    Hyperlipidemia    Meningioma (HCC) 1999   Myocardial infarction (HCC) 1987   Paresis of lower extremity (HCC) 1999   BLE/notes 08/25/2012   S/P radiation therapy 07/30/14-08/30/13   left breast cancer/50Gy   Seizures (HCC)    no seizures since 2014   Spastic paraparesis    S/P meningioma resection 1999/notes 08/25/2012   Stroke (HCC)    x 2, the last 2014    PAST  SURGICAL HISTORY: Past Surgical History:  Procedure Laterality Date   BRAIN MENINGIOMA EXCISION  05/1997   BREAST LUMPECTOMY WITH NEEDLE LOCALIZATION AND AXILLARY SENTINEL LYMPH NODE BX Left 06/22/2014   Procedure: LEFT WIRE LOCALIZED LUMPECTOPMY AND SENTINEL NODE MAPPING;  Surgeon: Lillette Reid III, MD;  Location: [facility] OR;  Service: General;  Laterality: Left;   CARDIAC CATHETERIZATION  1987   CORONARY ANGIOPLASTY  1987   denied intervention: "blockage wasn't bad enough"   LEFT HEART CATH AND CORONARY ANGIOGRAPHY N/A 07/08/2021   Procedure: LEFT HEART CATH AND CORONARY ANGIOGRAPHY;  Surgeon: Millicent Ally, MD;  Location: [facility] INVASIVE CV LAB;  Service: Cardiovascular;  Laterality: N/A;   TIBIA IM NAIL INSERTION Right 04/26/2018   Procedure: INTRAMEDULLARY (IM) NAIL TIBIAL;  Surgeon: Hardy Lia, MD;  Location: [facility] OR;  Service: Orthopedics;  Laterality: Right;    FAMILY HISTORY: Family History  Problem Relation Age of Onset   Breast cancer Mother 41       unilateral   Heart disease Father    Breast cancer Paternal Grandmother 83       unilateral    SOCIAL HISTORY: Social History   Socioeconomic History   Marital status: Married    Spouse name: david   Number of children: 2   Years of education: college   Highest education level: Bachelor's degree (e.g., BA, AB, BS)  Occupational History   Occupation: disabled  Tobacco Use   Smoking status: Former    Current packs/day: 2.00    Average packs/day: 2.0 packs/day for 10.0 years (20.0 ttl pk-yrs)    Types: Cigarettes   Smokeless tobacco: Never   Tobacco comments:    08/25/2012 "quit smoking cigarettes in 1985 when I had my heart attack"  Vaping Use   Vaping status: Never Used  Substance and Sexual Activity   Alcohol  use: No    Alcohol /week: 7.0 standard drinks of alcohol     Types: 7 Glasses of wine per week    Comment: occasionally   Drug use: No   Sexual activity: Never  Other Topics Concern   Not on file  Social History  Narrative   Not on file   Social Drivers of Health   Financial Resource Strain: Medium Risk (08/17/2021)   Overall Financial Resource Strain (CARDIA)    Difficulty of Paying Living Expenses: Somewhat hard  Food Insecurity: No Food Insecurity (09/04/2022)   Hunger Vital Sign    Worried About Running Out of Food in the Last Year: Never true    Ran Out of Food in the Last Year: Never true  Transportation Needs: No Transportation Needs (09/04/2022)   PRAPARE - Administrator, Civil Service (Medical): No    Lack of Transportation (Non-Medical): No  Physical Activity: Unknown (08/17/2021)   Exercise Vital Sign    Days of Exercise per Week: 0 days    Minutes of Exercise per Session: Not on file  Stress: Stress Concern Present (08/17/2021)   [employer] of Occupational Health - Occupational Stress Questionnaire    Feeling of Stress : Rather much  Social Connections: Unknown (08/17/2021)   Social Connection and Isolation Panel [NHANES]    Frequency of Communication with Friends and Family: Three times a week    Frequency of Social Gatherings with Friends and Family: Once a week    Attends Religious Services: Patient declined    Database administrator or Organizations: Yes    Attends Banker Meetings: Patient declined    Marital Status: Married  Catering manager Violence: Not on file      PHYSICAL EXAM Generalized: Well developed, in no acute distress, in a hospital bed  Neurological examination  Mentation: Alert oriented to time, place, history taking. Follows all commands speech and language fluent Cranial nerve II-XII: Facial symmetry noted Reflexes: UTA  DIAGNOSTIC DATA (LABS, IMAGING, TESTING) - I reviewed patient records, labs, notes, testing and imaging myself where available.  Lab Results  Component Value Date   WBC 12.5 (H) 07/27/2022   HGB 15.3 (H) 07/27/2022   HCT 48.6 (H) 07/27/2022   MCV 97.2 07/27/2022   PLT 258 07/27/2022      Component  Value Date/Time   NA 138 07/27/2022 1321   NA 128 (L) 07/04/2021 1459   NA 143 05/30/2014 1212   K 4.1 07/27/2022 1321   K 3.5 05/30/2014 1212   CL 105 07/27/2022 1321   CO2 22 07/27/2022 1321   CO2 28 05/30/2014 1212   GLUCOSE 101 (H) 07/27/2022 1321   GLUCOSE 109 05/30/2014 1212   BUN 9 07/27/2022 1321   BUN 7 (L) 07/04/2021 1459   BUN 14.6 05/30/2014 1212   CREATININE 0.57 07/27/2022 1321   CREATININE 0.67 09/25/2020 1403   CREATININE 0.56 03/08/2020 1524   CREATININE 0.7 05/30/2014 1212   CALCIUM  9.3 07/27/2022 1321   CALCIUM  8.9 04/28/2018 0346   CALCIUM  9.9 05/30/2014 1212   PROT 7.5 07/27/2022 1321   PROT 7.1 05/30/2014 1212   ALBUMIN 3.6 07/27/2022 1321   ALBUMIN 3.8 05/30/2014 1212   AST 24 07/27/2022 1321   AST 21 09/25/2020 1403   AST 26 05/30/2014 1212   ALT 31 07/27/2022 1321   ALT 22 09/25/2020 1403   ALT 34 05/30/2014 1212   ALKPHOS 75 07/27/2022 1321   ALKPHOS 75 05/30/2014 1212   BILITOT 0.4 07/27/2022 1321   BILITOT 0.6 09/25/2020 1403   BILITOT 0.49 05/30/2014 1212   GFRNONAA >60 07/27/2022 1321   GFRNONAA >60 09/25/2020 1403   GFRAA >60 09/13/2019 1016   Lab Results  Component Value Date   CHOL 145 04/26/2021  HDL 66 04/26/2021   LDLCALC 67 04/26/2021   LDLDIRECT 142.6 03/26/2011   TRIG 62 04/26/2021   CHOLHDL 2.2 04/26/2021   Lab Results  Component Value Date   HGBA1C 5.2 04/26/2021   Lab Results  Component Value Date   VITAMINB12 453 03/08/2020   Lab Results  Component Value Date   TSH 0.372 12/22/2021      ASSESSMENT AND PLAN 73 y.o. year old female  has a past medical history of Anxiety, Arthritis, Breast cancer of lower-outer quadrant of left female breast (HCC) (05/25/2014), Headache(784.0), Heart disease, HTN (hypertension), Hyperlipidemia, Meningioma (HCC) (1999), Myocardial infarction (HCC) (1987), Paresis of lower extremity (HCC) (1999), S/P radiation therapy (07/30/14-08/30/13), Seizures (HCC), Spastic paraparesis, and  Stroke (HCC). here with:  1.  Seizures   -Continue Vimpat  200 mg twice a day -Continue Keppra  500 mg twice a day - Advised if symptoms worsen or she develops new symptoms she should let us  know - Follow-up in 1 year or sooner if needed    Clem Currier, MSN, NP-C 12/28/2023, 8:58 AM [facility] [address], [address] [place], [place] [address] [phone]

## 2024-03-01 ENCOUNTER — Other Ambulatory Visit: Payer: Self-pay | Admitting: Internal Medicine

## 2024-03-01 DIAGNOSIS — Z8673 Personal history of transient ischemic attack (TIA), and cerebral infarction without residual deficits: Secondary | ICD-10-CM

## 2024-03-01 DIAGNOSIS — G8220 Paraplegia, unspecified: Secondary | ICD-10-CM

## 2024-04-02 ENCOUNTER — Emergency Department (HOSPITAL_COMMUNITY)

## 2024-04-02 ENCOUNTER — Emergency Department (HOSPITAL_COMMUNITY)
Admission: EM | Admit: 2024-04-02 | Discharge: 2024-04-02 | Disposition: A | Discharge status: Home / Self Care | Attending: Emergency Medicine | Admitting: Emergency Medicine

## 2024-04-02 ENCOUNTER — Encounter: Payer: Self-pay | Admitting: Hematology and Oncology

## 2024-04-02 ENCOUNTER — Other Ambulatory Visit: Payer: Self-pay

## 2024-04-02 ENCOUNTER — Encounter (HOSPITAL_COMMUNITY): Payer: Self-pay

## 2024-04-02 DIAGNOSIS — Z86011 Personal history of benign neoplasm of the brain: Secondary | ICD-10-CM | POA: Insufficient documentation

## 2024-04-02 DIAGNOSIS — R531 Weakness: Secondary | ICD-10-CM | POA: Diagnosis present

## 2024-04-02 DIAGNOSIS — Z79899 Other long term (current) drug therapy: Secondary | ICD-10-CM | POA: Diagnosis not present

## 2024-04-02 LAB — COMPREHENSIVE METABOLIC PANEL WITH GFR
ALT: 30 U/L (ref 0–44)
AST: 32 U/L (ref 15–41)
Albumin: 4 g/dL (ref 3.5–5.0)
Alkaline Phosphatase: 56 U/L (ref 38–126)
Anion gap: 11 (ref 5–15)
BUN: 12 mg/dL (ref 8–23)
CO2: 22 mmol/L (ref 22–32)
Calcium: 9.7 mg/dL (ref 8.9–10.3)
Chloride: 108 mmol/L (ref 98–111)
Creatinine, Ser: 0.61 mg/dL (ref 0.44–1.00)
GFR, Estimated: >60 mL/min (ref >60)
Glucose, Bld: 104 mg/dL — ABNORMAL HIGH (ref 70–99)
Potassium: 4.4 mmol/L (ref 3.5–5.1)
Sodium: 141 mmol/L (ref 135–145)
Total Bilirubin: 0.4 mg/dL (ref 0.0–1.2)
Total Protein: 7.2 g/dL (ref 6.5–8.1)

## 2024-04-02 LAB — URINALYSIS, ROUTINE W REFLEX MICROSCOPIC
Bilirubin Urine: NEGATIVE
Glucose, UA: NEGATIVE mg/dL
Hgb urine dipstick: NEGATIVE
Ketones, ur: NEGATIVE mg/dL
Leukocytes,Ua: NEGATIVE
Nitrite: NEGATIVE
Protein, ur: NEGATIVE mg/dL
Specific Gravity, Urine: 1.012 (ref 1.005–1.030)
pH: 7 (ref 5.0–8.0)

## 2024-04-02 LAB — CBC
HCT: 44.7 % (ref 36.0–46.0)
Hemoglobin: 14.8 g/dL (ref 12.0–15.0)
MCH: 31.3 pg (ref 26.0–34.0)
MCHC: 33.1 g/dL (ref 30.0–36.0)
MCV: 94.5 fL (ref 80.0–100.0)
Platelets: 269 K/uL (ref 150–400)
RBC: 4.73 MIL/uL (ref 3.87–5.11)
RDW: 13.3 % (ref 11.5–15.5)
WBC: 7.9 K/uL (ref 4.0–10.5)
nRBC: 0 % (ref 0.0–0.2)

## 2024-04-02 NOTE — Discharge Instructions (Signed)
 It is unclear what is causing the weakness today.  Be sure to call your neurologist in the morning for further recommendations and outpatient follow-up.  If you develop new or worsening weakness, fever, vomiting, or any other new/concerning symptoms then return to the ER.

## 2024-04-02 NOTE — ED Notes (Signed)
 Pt leaving in care of ptar. No questions or concerns from ptar or pt. Pt is leaving in no new onset distress at this time.

## 2024-04-02 NOTE — ED Provider Notes (Signed)
 [facility] EMERGENCY DEPARTMENT AT [facility] Provider Note   CSN: [id number] Arrival date & time: 04/02/24  1330     Patient presents with: Weakness   Stacy Diaz is a 73 y.o. female.   HPI 73 year old female history of history of meningioma, status postcraniotomy, with residual inability to walk and spastic paraplegia requiring a wheelchair, history of seizures and stroke presents today with new onset of right arm weakness.  She has had this happen multiple times before.  Due to her prior craniectomy she is unable to obtain MRIs.  Last known normal day was at 8 AM.  She currently reports that there is increased movement of the right hand from what it was but it is not back to baseline.  She denies any other changes.  She denies any headache.  She has not reported to be on any blood thinners.  There is a question whether or not she has had a urinary tract infection.  Her husband states that her nurse started amoxicillin last Friday stating that she did not look right.  He also states that they tested her urine frequently with what sounds like a dip stick.  He reports that she has had 2 UTIs before.    Prior to Admission medications   Medication Sig Start Date End Date Taking? Authorizing Provider  alendronate (FOSAMAX) 70 MG tablet Take 70 mg by mouth once a week. 10/28/23   [provider]  ASPERCREME LIDOCAINE  EX Apply 1 application topically daily as needed (muscle pain).    [provider]  atorvastatin  (LIPITOR) 10 MG tablet TAKE 1 TABLET (10 MG TOTAL) BY MOUTH DAILY AT 6 PM. 08/18/22   Theophilus Andrews, Tully GRADE, MD  baclofen  (LIORESAL ) 20 MG tablet TAKE 1 TABLET BY MOUTH FOUR TIMES A DAY 03/01/24   Theophilus Andrews, Tully GRADE, MD  calcium  carbonate (OS-CAL - DOSED IN MG OF ELEMENTAL CALCIUM ) 1250 (500 Ca) MG tablet Take 1 tablet (500 mg of elemental calcium  total) by mouth 2 (two) times daily with a meal. 12/29/21   Danton Reyes DASEN, MD   carboxymethylcellulose (REFRESH PLUS) 0.5 % SOLN Place 1 drop into both eyes every morning.    [provider]  clopidogrel  (PLAVIX ) 75 MG tablet TAKE 1 TABLET BY MOUTH EVERY DAY 03/01/24   Theophilus Andrews, Tully GRADE, MD  diazepam  (VALIUM ) 5 MG tablet TAKE 1+1/2 TABLETS IN THE AM AND 1 TAB AT BEDTIME Patient not taking: Reported on 12/28/2023 09/03/22   Theophilus Andrews, Tully GRADE, MD  donepezil (ARICEPT ODT) 5 MG disintegrating tablet Take 5 mg by mouth at bedtime. 12/08/23   [provider]  fluticasone  (FLONASE ) 50 MCG/ACT nasal spray Place 1 spray into both nostrils daily for 7 days. 07/28/22 08/04/22  Elnor Jayson LABOR, DO  guaiFENesin  (ROBITUSSIN) 100 MG/5ML liquid Take 5 mLs by mouth every 4 (four) hours as needed for cough or to loosen phlegm. 07/28/22   Elnor Jayson LABOR, DO  lacosamide  (VIMPAT ) 200 MG TABS tablet Take 1 tablet (200 mg total) by mouth 2 (two) times daily. 12/28/23   Millikan, Megan, NP  levETIRAcetam  (KEPPRA ) 750 MG tablet Take 1 tablet (750 mg total) by mouth 2 (two) times daily. 12/28/23   Millikan, Megan, NP  lisinopril  (ZESTRIL ) 40 MG tablet TAKE 1 TABLET BY MOUTH EVERY DAY 03/09/23   Theophilus Andrews, Tully GRADE, MD  liver oil-zinc  oxide (DESITIN) 40 % ointment Apply 1 application topically at bedtime. Apply to buttocks    [provider]  melatonin 5 MG TABS Take 5 mg by mouth at bedtime.    [provider]  metoprolol  tartrate (LOPRESSOR ) 100 MG tablet Take 1 tablet (100 mg total) by mouth 2 (two) times daily. 08/13/22   Theophilus Andrews, Tully GRADE, MD  Multiple Vitamin (MULTIVITAMIN WITH MINERALS) TABS tablet Take 1 tablet by mouth every morning.     [provider]  Oxycodone  HCl 10 MG TABS Take 1 tablet (10 mg total) by mouth every 8 (eight) hours as needed. 09/21/22   Theophilus Andrews, Tully GRADE, MD  polyethylene glycol (MIRALAX  / GLYCOLAX ) packet Take 17 g by mouth once a week. Mix with liquid and drink    [provider]   atenolol  (TENORMIN ) 50 MG tablet TAKE 1 & 1/2 TABLET BY MOUTH TWICE DAILY **NEED OFFICE VISIT 02/04/21 02/05/21  Theophilus Andrews, Tully GRADE, MD    Allergies: Patient has no known allergies.    Review of Systems  Updated Vital Signs BP 136/71   Pulse 66   Temp 97.6 F (36.4 C) (Axillary)   Resp 19   Ht 1.575 m (5' 2)   Wt 72.6 kg   SpO2 100%   BMI 29.26 kg/m   Physical Exam Vitals reviewed.  HENT:     Head: Normocephalic.     Right Ear: External ear normal.     Left Ear: External ear normal.     Nose: Nose normal.     Mouth/Throat:     Pharynx: Oropharynx is clear.  Eyes:     Extraocular Movements: Extraocular movements intact.     Pupils: Pupils are equal, round, and reactive to light.  Cardiovascular:     Rate and Rhythm: Normal rate and regular rhythm.     Pulses: Normal pulses.  Pulmonary:     Effort: Pulmonary effort is normal.  Abdominal:     General: Abdomen is flat.  Musculoskeletal:        General: Normal range of motion.     Cervical back: Normal range of motion.  Skin:    General: Skin is warm.     Capillary Refill: Capillary refill takes less than 2 seconds.  Neurological:     Mental Status: She is alert.     (all labs ordered are listed, but only abnormal results are displayed) Labs Reviewed  COMPREHENSIVE METABOLIC PANEL WITH GFR - Abnormal; Notable for the following components:      Result Value   Glucose, Bld 104 (*)    All other components within normal limits  CBC  URINALYSIS, ROUTINE W REFLEX MICROSCOPIC    EKG: EKG Interpretation Date/Time:  Sunday April 02 2024 13:40:38 EDT Ventricular Rate:  73 PR Interval:  206 QRS Duration:  117 QT Interval:  407 QTC Calculation: 449 R Axis:   -21  Text Interpretation: Sinus rhythm Ventricular trigeminy Probable left atrial enlargement Incomplete left bundle branch block Anterior Q waves, possibly due to ILBBB Interpretation limited secondary to artifact Confirmed by Freddi Hamilton [phone])  on 04/02/2024 4:11:40 PM  Radiology: CT Head Wo Contrast Result Date: 04/02/2024 CLINICAL DATA:  Headache, neuro deficit.  Lethargy. EXAM: CT HEAD WITHOUT CONTRAST TECHNIQUE: Contiguous axial images were obtained from the base of the skull through the vertex without intravenous contrast. RADIATION DOSE REDUCTION: This exam was performed according to the departmental dose-optimization program which includes automated exposure control, adjustment of the mA and/or kV according to patient size and/or use of iterative reconstruction technique. COMPARISON:  Head CT 12/24/2021 FINDINGS: Brain: Extensive  postsurgical changes are again noted from biparietal craniectomy with multiple wires fixing the cranioplasty and clips along the falx. Assessment of this region is limited by streak artifact, however extensive hypoattenuation in the parietal and posterior frontal lobes at the vertex is is similar to the prior CT and compatible with encephalomalacia. A chronic infarct in the anterior right basal ganglia is also unchanged. No definite acute infarct, intracranial hemorrhage, mass, midline shift, or extra-axial fluid collection is identified. There is mild cerebral atrophy. Vascular: Calcified atherosclerosis at the skull base. No hyperdense vessel. Skull: Craniectomy as described above. Unchanged chronically mottled appearance of other portions of the frontal and parieto-occipital skull. Sinuses/Orbits: Mild mucosal thickening in the sphenoid sinuses. Clear mastoid air cells. Unremarkable orbits. Other: None. IMPRESSION: 1. No evidence of acute intracranial abnormality. 2. Extensive postsurgical changes with unchanged encephalomalacia in the parietal and posterior frontal lobes. Electronically Signed   By: Dasie Hamburg M.D.   On: 04/02/2024 16:06     .Critical Care  Performed by: Levander Houston, MD Authorized by: Levander Houston, MD   Critical care provider statement:    Critical care time (minutes):  30   Critical  care time was exclusive of:  Separately billable procedures and treating other patients and teaching time   Critical care was time spent personally by me on the following activities:  Development of treatment plan with patient or surrogate, discussions with consultants, evaluation of patient's response to treatment, examination of patient, ordering and review of laboratory studies, ordering and review of radiographic studies, ordering and performing treatments and interventions, pulse oximetry, re-evaluation of patient's condition and review of old charts    Medications Ordered in the ED - No data to display  Clinical Course as of 04/02/24 1614  Sun Apr 02, 2024  1607 CBC reviewed interpreted abnormal [DR]    Clinical Course User Index [DR] Levander Houston, MD                                 Medical Decision Making Amount and/or Complexity of Data Reviewed Labs: ordered. Radiology: ordered.    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  Patient has history of seizures and possible history of strokes and is status postcraniotomy for meningioma. Plan here in the ED is to check head CT, labs, and urinalysis. There is some report of possible recent UTI symptoms. Patient does appear to have some increased weakness of right upper extremity from baseline.  Otherwise her left arm and bilateral leg weakness are at baseline.  She is awake and alert and interactive. Plan likely discharge to home if labs and CT without acute abnormality. Care discussed with Dr. Freddi who will reevaluate after return of     Final diagnoses:  Weakness    ED Discharge Orders     None          Levander Houston, MD 04/02/24 [phone]

## 2024-04-02 NOTE — ED Triage Notes (Signed)
 Pt presents to ED via EMS from home with reports of lethargy and generalized weakness. Alert and oriented x4. Pt reports feeling weaker than normal. States she got up at 0800 and took her normal prescribed medications. Extensive neurological history of many strokes and seizures. Does have metal in her head, no MRIs or EEGs.Just finished antibiotics for a UTI. Uses a purewick at home. Began with a cough earlier this week. Weak grip strength.

## 2024-04-02 NOTE — ED Notes (Signed)
 Patient transported to CT

## 2024-04-02 NOTE — ED Provider Notes (Signed)
 Care transferred to me.  CT head is unchanged from baseline.  I personally viewed/interpret these images and agree with radiology.  Labs are unremarkable including normal hemoglobin, no hyponatremia, etc.  No UTI.  I had an extensive talk with the patient and husband.  This has been an ongoing and recurrent problem for around 10 years or so.  There has been a question of possible seizures though at this point the patient feels like her arm is getting better.  Vital signs are reassuring.  There is no clear cause.  Has been off urine antibiotics for about 3 days so I think the negative urine is likely a true negative and doubt UTI.  At this point, with improvement, stable vitals, and a recurrent history of unexplained cause for these right arm weakness episodes, I do not think admission will provide much benefit.  Patient and husband agree.  They feel comfortable going back home and will be instructed to follow-up with PCP and neurology.  Given return precautions.   Freddi Hamilton, MD 04/02/24 [phone]

## 2024-04-03 ENCOUNTER — Telehealth: Payer: Self-pay

## 2024-04-03 ENCOUNTER — Encounter: Payer: Self-pay | Admitting: Adult Health

## 2024-04-03 NOTE — Telephone Encounter (Signed)
 I am not sure that Dr. Rosemarie has anything.  If he does not you can place on my schedule

## 2024-04-03 NOTE — Transitions of Care (Post Inpatient/ED Visit) (Signed)
   04/03/2024  Name: ZILPHA MCANDREW MRN: [id number] DOB: 1950/11/21  Today's TOC FU Call Status: Today's TOC FU Call Status:: Successful TOC FU Call Completed TOC FU Call Complete Date: 04/03/24 Patient's Name and Date of Birth confirmed.  Transition Care Management Follow-up Telephone Call Date of Discharge: 04/02/24 Discharge Facility: [facility]) How have you been since you were released from the hospital?: Same Any questions or concerns?: No  Items Reviewed: Did you receive and understand the discharge instructions provided?: Yes Medications obtained,verified, and reconciled?: No  Medications Reviewed Today: Medications Reviewed Today   Medications were not reviewed in this encounter     Home Care and Equipment/Supplies:    Functional Questionnaire:    Follow up appointments reviewed: PCP Follow-up appointment confirmed?: Yes Date of PCP follow-up appointment?: 04/03/24 Follow-up Provider: per pt's spouse. He states pt is unable to come in to Dr. Dennard office. has new pcp: [facility]. had a follow up with them today. also contacted pt's neurology's office.    SIGNATURE: Chau Savell, CMA

## 2024-04-04 NOTE — Telephone Encounter (Signed)
 Is she is able to come in that would be preferred

## 2024-04-06 ENCOUNTER — Telehealth (INDEPENDENT_AMBULATORY_CARE_PROVIDER_SITE_OTHER): Admitting: Adult Health

## 2024-04-06 DIAGNOSIS — R569 Unspecified convulsions: Secondary | ICD-10-CM

## 2024-04-06 DIAGNOSIS — R29898 Other symptoms and signs involving the musculoskeletal system: Secondary | ICD-10-CM

## 2024-04-06 NOTE — Progress Notes (Signed)
 PATIENT: Stacy Diaz DOB: [birth year]  REASON FOR VISIT: follow up HISTORY FROM: patient  Virtual Visit via Video Note  I connected with Stacy Diaz on 04/06/24 at  9:00 AM EDT by a video enabled telemedicine application located remotely at [facility] and verified that I am speaking with the correct person using two identifiers who was located at their own home in [place]    I discussed the limitations of evaluation and management by telemedicine and the availability of in person appointments. The patient expressed understanding and agreed to proceed.   PATIENT: Stacy Diaz DOB: 1950-09-05  REASON FOR VISIT: follow up HISTORY FROM: patient  HISTORY OF PRESENT ILLNESS: Today 04/06/24:  Stacy Diaz is a 73 y.o. female with a history of seizures. Returns today for follow-up.  She was recently in the hospital for weakness.  Her husband states that about 10 days before she was not feeling well.  She has a PCP that does housecalls.  She had a fever and PCP prescribed antibiotics.  Husband states that she finished the antibiotics on Wednesday.  She then became lethargic and was sleeping a lot.  On Sunday morning she woke up, took her medications and then went back to sleep.  When she woke up around 1230 she was complaining that she could not move either arm.  He states that she could only lift them about an inch.  He called EMS.  He reports that her systolic blood pressure was elevated at 230.  They took her to the ED.  He states that after about an hour or slightly longer her blood pressure normalized.  He states that these events are similar to the other episode she has had over the last 10 years.  They did do testing at the hospital which was all unremarkable.  I reviewed CT scan of the brain.  She remains on Keppra  and lacosamide .  Taking her medication consistently.  Husband has noticed some changes with her memory reports that PCP has done memory testing.   She was initially on Aricept but they felt that it made her memory worse so it has been discontinued for the last 2 months.  She returns today for an evaluation.  Unable to do MRI due to previous craniotomy with wire for skull closure   12/28/23: Stacy Diaz is a 73 y.o. female with a history of seizures. Returns today for follow-up.  The patient is doing another virtual visit.  She is bedbound and they find it very difficult for transportation to get her here.  She does have regular follow-ups with her PCP.  At the last visit we increased her Keppra  to 750 mg twice a day.  She reports that she has not had any additional episodes.  Denies any numbness in the arms as she had reported before.  Reports that she is tolerating the medication well.  Also remains on lacosamide  200 twice a day.  Has reports that she just was recently placed on Bactrim  for UTI.  She returns today for an evaluation.  11/26/22: Stacy Diaz is a 73 y.o. female with a history of Seizure events. Returns today for follow-up.  She remains on Vimpat  200 mg twice a day and Keppra  500 mg twice a day.  She had an episode in December.  Reports that her right arm went numb.  She states that this lasted about 10 hours before it resolved.  She did not go to the  emergency room initially.  She states 2 to 3 days later she had a bad cough and was worried about pneumonia so she went to the emergency room.  She was diagnosed with a UTI.  Her husband states that since the initial event of right arm numbness she has not returned to her baseline.  He reports generalized weakness.  She is currently working with physical therapy.  She can transfer to a wheelchair but does not ambulate.  He states that each time she has an event she does not return to her original baseline.  Patient is bedbound therefore she cannot come to an appointment or do imaging per patient  HISTORY  05/06/22: Stacy Diaz is a 73 year old female with a history of seizures.   She returns today for follow-up.  She is here today with her husband.  She reports that two weeks ago was complaining that left side of face was numb. Did not travel to arm like before. Lasted a few hours per husband.  She is currently taking Vimpat  100 mg in the morning and 200 mg in the evening and Keppra  500 mg twice a day.  She is in a motorized wheelchair.  She returns today for an evaluation.   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  Code stroke was initiated upon ER arrival but upon evaluation neurology felt her symptoms most likely represented acute metabolic encephalopathy instead of stroke.  CT head showed old encephalomalacia and cerebral brain and chronic white matter changes and old craniectomy changes.  EEG showed cortical dysfunction in the left central parietal region and moderate diffuse encephalopathy.  No definite seizure activity35.  Neurology felt she had generalized encephalopathy from infection in setting of poor cognitive reserve with worsening of old symptoms but subsequently was noted to have focal seizures and started on Keppra  initially and subsequently Vimpat  was added.  She was found to have severe sepsis due to E. coli and Proteus urinary tract infection.  She was treated initially with Rocephin  which was sensitive to both organisms subsequently switched to Keflex .  She was transferred to rehabilitation at [facility] which made gradual improvement and she is recently come home.  She states she had 1 more breakthrough seizure last Tuesday which was brief and did not last  long.  She is tolerating Keppra  finder milligram twice daily as well as Vimpat  100 mg twice daily without any significant side effects.  She feels she has not returned back to her baseline and she cannot multitask and her cognition is not as sharp as it used to be and she has some trouble finding words and has to think about the words.  She is scheduled to start home physical and Occupational Therapy soon.   REVIEW OF SYSTEMS: Out of a complete 14 system review of symptoms, the patient complains only of the following symptoms, and all other reviewed systems are negative.  ALLERGIES: No Known Allergies  HOME MEDICATIONS: Outpatient Medications Prior to Visit  Medication Sig Dispense Refill   alendronate (FOSAMAX) 70 MG tablet Take 70 mg by mouth once a week.     ASPERCREME LIDOCAINE  EX Apply  1 application topically daily as needed (muscle pain).     atorvastatin  (LIPITOR) 10 MG tablet TAKE 1 TABLET (10 MG TOTAL) BY MOUTH DAILY AT 6 PM. 90 tablet 1   baclofen  (LIORESAL ) 20 MG tablet TAKE 1 TABLET BY MOUTH FOUR TIMES A DAY 360 tablet 0   calcium  carbonate (OS-CAL - DOSED IN MG OF ELEMENTAL CALCIUM ) 1250 (500 Ca) MG tablet Take 1 tablet (500 mg of elemental calcium  total) by mouth 2 (two) times daily with a meal.     carboxymethylcellulose (REFRESH PLUS) 0.5 % SOLN Place 1 drop into both eyes every morning.     clopidogrel  (PLAVIX ) 75 MG tablet TAKE 1 TABLET BY MOUTH EVERY DAY 90 tablet 0   diazepam  (VALIUM ) 5 MG tablet TAKE 1+1/2 TABLETS IN THE AM AND 1 TAB AT BEDTIME (Patient not taking: Reported on 12/28/2023) 75 tablet 0   donepezil (ARICEPT ODT) 5 MG disintegrating tablet Take 5 mg by mouth at bedtime.     fluticasone  (FLONASE ) 50 MCG/ACT nasal spray Place 1 spray into both nostrils daily for 7 days. 15.8 mL 0   guaiFENesin  (ROBITUSSIN) 100 MG/5ML liquid Take 5 mLs by mouth every 4 (four) hours as needed for cough or to loosen phlegm. 120 mL 0   lacosamide  (VIMPAT ) 200 MG TABS tablet Take 1  tablet (200 mg total) by mouth 2 (two) times daily. 180 tablet 1   levETIRAcetam  (KEPPRA ) 750 MG tablet Take 1 tablet (750 mg total) by mouth 2 (two) times daily. 180 tablet 3   lisinopril  (ZESTRIL ) 40 MG tablet TAKE 1 TABLET BY MOUTH EVERY DAY 90 tablet 1   liver oil-zinc  oxide (DESITIN) 40 % ointment Apply 1 application topically at bedtime. Apply to buttocks     melatonin 5 MG TABS Take 5 mg by mouth at bedtime.     metoprolol  tartrate (LOPRESSOR ) 100 MG tablet Take 1 tablet (100 mg total) by mouth 2 (two) times daily. 180 tablet 1   Multiple Vitamin (MULTIVITAMIN WITH MINERALS) TABS tablet Take 1 tablet by mouth every morning.      Oxycodone  HCl 10 MG TABS Take 1 tablet (10 mg total) by mouth every 8 (eight) hours as needed. 270 tablet 0   polyethylene glycol (MIRALAX  / GLYCOLAX ) packet Take 17 g by mouth once a week. Mix with liquid and drink     No facility-administered medications prior to visit.    PAST MEDICAL HISTORY: Past Medical History:  Diagnosis Date   Anxiety    Arthritis    probably (08/25/2012)   Breast cancer of lower-outer quadrant of left female breast (HCC) 05/25/2014   Headache(784.0)    not real frequent (08/25/2012)   Heart disease    HTN (hypertension)    Hyperlipidemia    Meningioma (HCC) 1999   Myocardial infarction (HCC) 1987   Paresis of lower extremity (HCC) 1999   BLE/notes 08/25/2012   S/P radiation therapy 07/30/14-08/30/13   left breast cancer/50Gy   Seizures (HCC)    no seizures since 2014   Spastic paraparesis    S/P meningioma resection 1999/notes 08/25/2012   Stroke (HCC)    x 2, the last 2014    PAST SURGICAL HISTORY: Past Surgical History:  Procedure Laterality Date   BRAIN MENINGIOMA EXCISION  05/1997   BREAST LUMPECTOMY WITH NEEDLE LOCALIZATION AND AXILLARY SENTINEL LYMPH NODE BX Left 06/22/2014   Procedure: LEFT WIRE LOCALIZED LUMPECTOPMY AND SENTINEL NODE MAPPING;  Surgeon: Deward Null III, MD;  Location: [facility] OR;  Service: General;  Laterality: Left;   CARDIAC CATHETERIZATION  1987   CORONARY ANGIOPLASTY  1987   denied intervention: blockage wasn't bad enough   LEFT HEART CATH AND CORONARY ANGIOGRAPHY N/A 07/08/2021   Procedure: LEFT HEART CATH AND CORONARY ANGIOGRAPHY;  Surgeon: Burnard Debby LABOR, MD;  Location: [facility] INVASIVE CV LAB;  Service: Cardiovascular;  Laterality: N/A;   TIBIA IM NAIL INSERTION Right 04/26/2018   Procedure: INTRAMEDULLARY (IM) NAIL TIBIAL;  Surgeon: Celena Sharper, MD;  Location: [facility] OR;  Service: Orthopedics;  Laterality: Right;    FAMILY HISTORY: Family History  Problem Relation Age of Onset   Breast cancer Mother 25       unilateral   Heart disease Father    Breast cancer Paternal Grandmother 4       unilateral    SOCIAL HISTORY: Social History   Socioeconomic History   Marital status: Married    Spouse name: david   Number of children: 2   Years of education: college   Highest education level: Bachelor's degree (e.g., BA, AB, BS)  Occupational History   Occupation: disabled  Tobacco Use   Smoking status: Former    Current packs/day: 2.00    Average packs/day: 2.0 packs/day for 10.0 years (20.0 ttl pk-yrs)    Types: Cigarettes   Smokeless tobacco: Never   Tobacco comments:    08/25/2012 quit smoking cigarettes in 1985 when I had my heart attack  Vaping Use   Vaping status: Never Used  Substance and Sexual Activity   Alcohol  use: No    Alcohol /week: 7.0 standard drinks of alcohol     Types: 7 Glasses of wine per week    Comment: occasionally   Drug use: No   Sexual activity: Never  Other Topics Concern   Not on file  Social History Narrative   Not on file   Social Drivers of Health   Financial Resource Strain: Medium Risk (08/17/2021)   Overall Financial Resource Strain (CARDIA)    Difficulty of Paying Living Expenses: Somewhat hard  Food Insecurity: No Food Insecurity (09/04/2022)   Hunger Vital Sign    Worried About Running Out of Food in the Last Year: Never  true    Ran Out of Food in the Last Year: Never true  Transportation Needs: No Transportation Needs (09/04/2022)   PRAPARE - Administrator, Civil Service (Medical): No    Lack of Transportation (Non-Medical): No  Physical Activity: Unknown (08/17/2021)   Exercise Vital Sign    Days of Exercise per Week: 0 days    Minutes of Exercise per Session: Not on file  Stress: Stress Concern Present (08/17/2021)   [employer] of Occupational Health - Occupational Stress Questionnaire    Feeling of Stress : Rather much  Social Connections: Unknown (08/17/2021)   Social Connection and Isolation Panel    Frequency of Communication with Friends and Family: Three times a week    Frequency of Social Gatherings with Friends and Family: Once a week    Attends Religious Services: Patient declined    Database administrator or Organizations: Yes    Attends Banker Meetings: Patient declined    Marital Status: Married  Catering manager Violence: Not on file      PHYSICAL EXAM Generalized: Well developed, in no acute distress, in a hospital bed  Neurological examination  Mentation: Alert oriented to time, place, history taking. Follows all commands speech and language fluent Cranial nerve II-XII: Facial symmetry noted Reflexes: UTA  DIAGNOSTIC DATA (  LABS, IMAGING, TESTING) - I reviewed patient records, labs, notes, testing and imaging myself where available.  Lab Results  Component Value Date   WBC 7.9 04/02/2024   HGB 14.8 04/02/2024   HCT 44.7 04/02/2024   MCV 94.5 04/02/2024   PLT 269 04/02/2024      Component Value Date/Time   NA 141 04/02/2024 1444   NA 128 (L) 07/04/2021 1459   NA 143 05/30/2014 1212   K 4.4 04/02/2024 1444   K 3.5 05/30/2014 1212   CL 108 04/02/2024 1444   CO2 22 04/02/2024 1444   CO2 28 05/30/2014 1212   GLUCOSE 104 (H) 04/02/2024 1444   GLUCOSE 109 05/30/2014 1212   BUN 12 04/02/2024 1444   BUN 7 (L) 07/04/2021 1459   BUN 14.6  05/30/2014 1212   CREATININE 0.61 04/02/2024 1444   CREATININE 0.67 09/25/2020 1403   CREATININE 0.56 03/08/2020 1524   CREATININE 0.7 05/30/2014 1212   CALCIUM  9.7 04/02/2024 1444   CALCIUM  8.9 04/28/2018 0346   CALCIUM  9.9 05/30/2014 1212   PROT 7.2 04/02/2024 1444   PROT 7.1 05/30/2014 1212   ALBUMIN 4.0 04/02/2024 1444   ALBUMIN 3.8 05/30/2014 1212   AST 32 04/02/2024 1444   AST 21 09/25/2020 1403   AST 26 05/30/2014 1212   ALT 30 04/02/2024 1444   ALT 22 09/25/2020 1403   ALT 34 05/30/2014 1212   ALKPHOS 56 04/02/2024 1444   ALKPHOS 75 05/30/2014 1212   BILITOT 0.4 04/02/2024 1444   BILITOT 0.6 09/25/2020 1403   BILITOT 0.49 05/30/2014 1212   GFRNONAA >60 04/02/2024 1444   GFRNONAA >60 09/25/2020 1403   GFRAA >60 09/13/2019 1016   Lab Results  Component Value Date   CHOL 145 04/26/2021   HDL 66 04/26/2021   LDLCALC 67 04/26/2021   LDLDIRECT 142.6 03/26/2011   TRIG 62 04/26/2021   CHOLHDL 2.2 04/26/2021   Lab Results  Component Value Date   HGBA1C 5.2 04/26/2021   Lab Results  Component Value Date   VITAMINB12 453 03/08/2020   Lab Results  Component Value Date   TSH 0.372 12/22/2021      ASSESSMENT AND PLAN 73 y.o. year old female  has a past medical history of Anxiety, Arthritis, Breast cancer of lower-outer quadrant of left female breast (HCC) (05/25/2014), Headache(784.0), Heart disease, HTN (hypertension), Hyperlipidemia, Meningioma (HCC) (1999), Myocardial infarction (HCC) (1987), Paresis of lower extremity (HCC) (1999), S/P radiation therapy (07/30/14-08/30/13), Seizures (HCC), Spastic paraparesis, and Stroke (HCC). here with:  1.  Seizures   - Continue Vimpat  200 mg twice a day - Continue Keppra  500 mg twice a day - Advised if symptoms worsen or she develops new symptoms she should let us  know - Follow-up in 8 months or sooner if needed if needed  Patient is only able to do virtual visits due to being homebound.  Patient and her husband  understand the limitations with virtual visits.  Duwaine Russell, MSN, NP-C 04/06/2024, 6:04 AM [facility] [address], [address] [place], [place] [address] [phone]  The patient's condition requires frequent monitoring and adjustments in the treatment plan, reflecting the ongoing complexity of care.  This provider is the continuing focal point for all needed services for this condition.

## 2024-04-06 NOTE — Patient Instructions (Signed)
 Your Plan:  Continue Keppra  500 mg twice a day Continue lacosamide  200 mg twice a day Keep a check on blood pressure at home.      Thank you for coming to see us  at [facility]. I hope we have been able to provide you high quality care today.  You may receive a patient satisfaction survey over the next few weeks. We would appreciate your feedback and comments so that we may continue to improve ourselves and the health of our patients.

## 2024-04-09 NOTE — Progress Notes (Signed)
 I agree with the above plan

## 2024-04-10 NOTE — Progress Notes (Signed)
 Discussed with Dr. Rosemarie He advised that if she had brisk reflexes we could consider MRI of the C-spine however unable to complete an exam as this was a virtual visit and she is homebound.  I will have my nurse reach out to the patient and her husband advised that we can consider a cervical MRI if bilateral arm weakness persist

## 2024-04-10 NOTE — Progress Notes (Signed)
 I agree with the above plan

## 2024-04-13 ENCOUNTER — Telehealth: Payer: Self-pay | Admitting: *Deleted

## 2024-04-13 NOTE — Telephone Encounter (Signed)
 Spoke with patient's husband (on [place]) and relayed that if patient continues with bilateral arm weakness, consider  imaging neck. Patient unable to have MRI. He did say for now patient's arm weakness is better than last week (left still worse than R) and she also starts PT/OT next week. Megan NP took call over and answered husband's further questions.

## 2024-04-13 NOTE — Telephone Encounter (Signed)
 I also spoke to the patient's husband.  Clarified that we do know that she cannot have an MRI-that is dictated in my last note.  Did advise that if bilateral arm weakness worsens or returns we may consider imaging of the neck.

## 2024-05-30 ENCOUNTER — Other Ambulatory Visit: Payer: Self-pay | Admitting: Internal Medicine

## 2024-05-30 DIAGNOSIS — Z8673 Personal history of transient ischemic attack (TIA), and cerebral infarction without residual deficits: Secondary | ICD-10-CM

## 2024-05-30 DIAGNOSIS — G8220 Paraplegia, unspecified: Secondary | ICD-10-CM

## 2024-06-27 ENCOUNTER — Other Ambulatory Visit: Payer: Self-pay | Admitting: Adult Health

## 2024-07-03 NOTE — Telephone Encounter (Signed)
 Pt's spouse states he was told by the pharmacy that Duwaine, NP denied the refill for the lacosamide  (VIMPAT ) 200 MG TABS tablet  so he reached out to pt's PCP and they filled it.  Spouse would like a call to discuss why it was denied by Megan,NP

## 2024-07-04 ENCOUNTER — Other Ambulatory Visit: Payer: Self-pay | Admitting: Adult Health

## 2024-07-04 ENCOUNTER — Other Ambulatory Visit: Payer: Self-pay

## 2024-07-04 ENCOUNTER — Other Ambulatory Visit (HOSPITAL_BASED_OUTPATIENT_CLINIC_OR_DEPARTMENT_OTHER): Payer: Self-pay

## 2024-07-04 ENCOUNTER — Encounter: Payer: Self-pay | Admitting: Hematology and Oncology

## 2024-07-04 ENCOUNTER — Telehealth: Payer: Self-pay | Admitting: Adult Health

## 2024-07-04 MED ORDER — LEVETIRACETAM 500 MG PO TABS: 500.0000 mg | ORAL_TABLET | Freq: Two times a day (BID) | ORAL | 1 refills | Status: AC

## 2024-07-04 MED ORDER — IPRATROPIUM-ALBUTEROL 0.5-2.5 (3) MG/3ML IN SOLN: 3.0000 mL | Freq: Four times a day (QID) | RESPIRATORY_TRACT | 3 refills | Status: AC | PRN

## 2024-07-04 MED ORDER — DONEPEZIL HCL 5 MG PO TABS
5.0000 mg | ORAL_TABLET | Freq: Every day | ORAL | 3 refills | Status: AC
  Filled 2024-07-15: qty 90, 90d supply, fill #0

## 2024-07-04 MED ORDER — LEVOCETIRIZINE DIHYDROCHLORIDE 5 MG PO TABS
5.0000 mg | ORAL_TABLET | Freq: Every evening | ORAL | 3 refills | Status: AC
  Filled 2024-08-01: qty 90, 90d supply, fill #0
  Filled 2024-08-27: qty 90, 90d supply, fill #1

## 2024-07-04 MED ORDER — ATORVASTATIN CALCIUM 10 MG PO TABS
10.0000 mg | ORAL_TABLET | Freq: Every day | ORAL | 1 refills | Status: AC
  Filled 2024-07-04: qty 90, 90d supply, fill #0

## 2024-07-04 MED ORDER — ALBUTEROL SULFATE HFA 108 (90 BASE) MCG/ACT IN AERS
1.0000 | INHALATION_SPRAY | RESPIRATORY_TRACT | 3 refills | Status: AC | PRN
Start: 2024-04-21 — End: ?

## 2024-07-04 MED ORDER — BENZONATATE 200 MG PO CAPS: 200.0000 mg | ORAL_CAPSULE | Freq: Three times a day (TID) | ORAL | 1 refills | Status: AC | PRN

## 2024-07-04 MED ORDER — BACLOFEN 20 MG PO TABS: 20.0000 mg | ORAL_TABLET | Freq: Four times a day (QID) | ORAL | 3 refills | Status: AC

## 2024-07-04 MED ORDER — LOSARTAN POTASSIUM 50 MG PO TABS
50.0000 mg | ORAL_TABLET | Freq: Every day | ORAL | 3 refills | Status: AC
  Filled 2024-08-27: qty 90, 90d supply, fill #0

## 2024-07-04 MED ORDER — ALENDRONATE SODIUM 70 MG PO TABS
70.0000 mg | ORAL_TABLET | ORAL | 1 refills | Status: AC
  Filled 2024-07-05: qty 12, 84d supply, fill #0

## 2024-07-04 MED ORDER — AMLODIPINE BESYLATE 10 MG PO TABS
10.0000 mg | ORAL_TABLET | Freq: Every day | ORAL | 1 refills | Status: AC
  Filled 2024-08-27: qty 90, 90d supply, fill #0

## 2024-07-04 NOTE — Telephone Encounter (Signed)
 Fyi ..  Pt son called to request  for Future refill to be sent to [facility]   Phone number  [phone]

## 2024-07-04 NOTE — Telephone Encounter (Signed)
Updated pharmacy in EPIC 

## 2024-07-05 ENCOUNTER — Other Ambulatory Visit (HOSPITAL_BASED_OUTPATIENT_CLINIC_OR_DEPARTMENT_OTHER): Payer: Self-pay

## 2024-07-05 NOTE — Telephone Encounter (Signed)
 Last seen 04/06/24 Next appt 12/05/24 Dispenses   Dispensed Days Supply Quantity Provider Pharmacy  LACOSAMIDE  100 MG TABLET 07/03/2024 90 360 tablet Hawley, Jamie E, NP [facility] [phone] - G...  LACOSAMIDE  200 MG TABLET 03/29/2024 90 180 each Millikan, Megan, NP [facility] [phone] - G...  LACOSAMIDE  200 MG TABLET 12/29/2023 90 180 each Millikan, Megan, NP [facility] [phone] - G...  LACOSAMIDE  100 MG TABLET 12/23/2023 30 120 tablet Sherryl Bouchard, NP [id number] ...  LACOSAMIDE  100 MG TABLET 11/23/2023 30 120 tablet Sherryl Bouchard, NP [id number] ...  LACOSAMIDE  100 MG TABLET 10/18/2023 30 120 tablet Sherryl Bouchard, NP [id number] ...  LACOSAMIDE  100 MG TABLET 09/20/2023 30 120 tablet Sherryl Bouchard, NP [id number] ...  LACOSAMIDE  100 MG TABLET 08/23/2023 30 120 tablet Sherryl Bouchard, NP [id number] ...  LACOSAMIDE  100 MG TABLET 07/24/2023 30 120 tablet Sherryl Bouchard, NP [id number] ...     This was filled by another provider recently so will deny

## 2024-07-15 ENCOUNTER — Other Ambulatory Visit (HOSPITAL_BASED_OUTPATIENT_CLINIC_OR_DEPARTMENT_OTHER): Payer: Self-pay

## 2024-08-01 ENCOUNTER — Other Ambulatory Visit (HOSPITAL_BASED_OUTPATIENT_CLINIC_OR_DEPARTMENT_OTHER): Payer: Self-pay

## 2024-08-01 MED ORDER — DONEPEZIL HCL 10 MG PO TABS
10.0000 mg | ORAL_TABLET | Freq: Every day | ORAL | 2 refills | Status: AC
  Filled 2024-08-01 (×2): qty 30, 30d supply, fill #0
  Filled 2024-08-27: qty 30, 30d supply, fill #1

## 2024-08-01 MED ORDER — OXYCODONE HCL 10 MG PO TABS
10.0000 mg | ORAL_TABLET | Freq: Three times a day (TID) | ORAL | 0 refills | Status: AC | PRN
  Filled 2024-08-01 – 2024-08-30 (×3): qty 90, 30d supply, fill #0

## 2024-08-01 MED ORDER — NYSTATIN 100000 UNIT/ML MT SUSP
5.0000 mL | Freq: Four times a day (QID) | OROMUCOSAL | 1 refills | Status: AC | PRN
  Filled 2024-08-01: qty 240, 12d supply, fill #0

## 2024-08-01 MED ORDER — OXYCODONE HCL 10 MG PO TABS
10.0000 mg | ORAL_TABLET | Freq: Three times a day (TID) | ORAL | 0 refills | Status: AC | PRN
  Filled 2024-08-01: qty 90, 30d supply, fill #0

## 2024-08-02 ENCOUNTER — Other Ambulatory Visit (HOSPITAL_BASED_OUTPATIENT_CLINIC_OR_DEPARTMENT_OTHER): Payer: Self-pay

## 2024-08-16 ENCOUNTER — Other Ambulatory Visit (HOSPITAL_COMMUNITY): Payer: Self-pay

## 2024-08-16 ENCOUNTER — Other Ambulatory Visit (HOSPITAL_BASED_OUTPATIENT_CLINIC_OR_DEPARTMENT_OTHER): Payer: Self-pay

## 2024-08-16 MED ORDER — PREDNISONE 20 MG PO TABS
20.0000 mg | ORAL_TABLET | Freq: Every day | ORAL | 0 refills | Status: AC
  Filled 2024-08-16 (×2): qty 5, 5d supply, fill #0

## 2024-08-18 ENCOUNTER — Other Ambulatory Visit (HOSPITAL_BASED_OUTPATIENT_CLINIC_OR_DEPARTMENT_OTHER): Payer: Self-pay

## 2024-08-22 ENCOUNTER — Encounter: Payer: Self-pay | Admitting: Hematology and Oncology

## 2024-08-22 ENCOUNTER — Other Ambulatory Visit (HOSPITAL_BASED_OUTPATIENT_CLINIC_OR_DEPARTMENT_OTHER): Payer: Self-pay

## 2024-08-22 MED ORDER — ADHESIVE BANDAGES FOAM MISC: 1 refills | Status: AC

## 2024-08-28 ENCOUNTER — Other Ambulatory Visit: Payer: Self-pay

## 2024-08-28 ENCOUNTER — Other Ambulatory Visit (HOSPITAL_BASED_OUTPATIENT_CLINIC_OR_DEPARTMENT_OTHER): Payer: Self-pay

## 2024-08-28 MED ORDER — CLOPIDOGREL BISULFATE 75 MG PO TABS
75.0000 mg | ORAL_TABLET | Freq: Every day | ORAL | 1 refills | Status: AC
  Filled 2024-08-28: qty 90, 90d supply, fill #0

## 2024-08-30 ENCOUNTER — Other Ambulatory Visit (HOSPITAL_BASED_OUTPATIENT_CLINIC_OR_DEPARTMENT_OTHER): Payer: Self-pay

## 2024-08-30 ENCOUNTER — Other Ambulatory Visit: Payer: Self-pay

## 2024-12-05 ENCOUNTER — Telehealth: Admitting: Adult Health
